# Patient Record
Sex: Male | Born: 1955 | Race: White | Hispanic: No | Marital: Married | Smoking: Never smoker
Health system: Southern US, Community
[De-identification: ages and names within clinical notes are randomized; demographics above are authoritative.]

## PROBLEM LIST (undated history)

## (undated) DIAGNOSIS — I10 Essential (primary) hypertension: Secondary | ICD-10-CM

## (undated) DIAGNOSIS — I739 Peripheral vascular disease, unspecified: Secondary | ICD-10-CM

## (undated) DIAGNOSIS — N529 Male erectile dysfunction, unspecified: Secondary | ICD-10-CM

## (undated) DIAGNOSIS — I6529 Occlusion and stenosis of unspecified carotid artery: Secondary | ICD-10-CM

## (undated) DIAGNOSIS — E785 Hyperlipidemia, unspecified: Secondary | ICD-10-CM

## (undated) DIAGNOSIS — L0390 Cellulitis, unspecified: Secondary | ICD-10-CM

## (undated) HISTORY — DX: Occlusion and stenosis of unspecified carotid artery: I65.29

## (undated) HISTORY — PX: TONSILLECTOMY: SUR1361

## (undated) HISTORY — DX: Cellulitis, unspecified: L03.90

## (undated) HISTORY — DX: Hyperlipidemia, unspecified: E78.5

## (undated) HISTORY — PX: COLONOSCOPY: SHX174

## (undated) HISTORY — DX: Essential (primary) hypertension: I10

## (undated) HISTORY — DX: Male erectile dysfunction, unspecified: N52.9

## (undated) HISTORY — PX: VASECTOMY: SHX75

---

## 2008-02-02 ENCOUNTER — Ambulatory Visit (HOSPITAL_COMMUNITY): Admission: RE | Admit: 2008-02-02 | Discharge: 2008-02-02 | Payer: Self-pay | Admitting: Family Medicine

## 2008-02-02 ENCOUNTER — Ambulatory Visit: Payer: Self-pay | Admitting: Vascular Surgery

## 2008-02-02 ENCOUNTER — Encounter (INDEPENDENT_AMBULATORY_CARE_PROVIDER_SITE_OTHER): Payer: Self-pay | Admitting: Family Medicine

## 2008-04-05 ENCOUNTER — Ambulatory Visit: Payer: Self-pay | Admitting: Vascular Surgery

## 2008-12-20 ENCOUNTER — Ambulatory Visit: Payer: Self-pay | Admitting: Vascular Surgery

## 2009-06-13 ENCOUNTER — Ambulatory Visit: Payer: Self-pay | Admitting: Vascular Surgery

## 2010-05-30 ENCOUNTER — Ambulatory Visit: Payer: Self-pay | Admitting: Vascular Surgery

## 2010-10-02 ENCOUNTER — Encounter (INDEPENDENT_AMBULATORY_CARE_PROVIDER_SITE_OTHER): Payer: BC Managed Care – PPO

## 2010-10-02 DIAGNOSIS — I739 Peripheral vascular disease, unspecified: Secondary | ICD-10-CM

## 2010-11-07 ENCOUNTER — Encounter (INDEPENDENT_AMBULATORY_CARE_PROVIDER_SITE_OTHER): Payer: BC Managed Care – PPO

## 2010-11-07 ENCOUNTER — Other Ambulatory Visit (INDEPENDENT_AMBULATORY_CARE_PROVIDER_SITE_OTHER): Payer: BC Managed Care – PPO

## 2010-11-07 ENCOUNTER — Encounter (INDEPENDENT_AMBULATORY_CARE_PROVIDER_SITE_OTHER): Payer: BC Managed Care – PPO | Admitting: Vascular Surgery

## 2010-11-07 DIAGNOSIS — I7092 Chronic total occlusion of artery of the extremities: Secondary | ICD-10-CM

## 2010-11-07 DIAGNOSIS — R0989 Other specified symptoms and signs involving the circulatory and respiratory systems: Secondary | ICD-10-CM

## 2010-11-07 DIAGNOSIS — I6529 Occlusion and stenosis of unspecified carotid artery: Secondary | ICD-10-CM

## 2010-11-08 NOTE — Assessment & Plan Note (Signed)
OFFICE VISIT  Clinton [name], Clinton [name] DOB:  10-04-1955                                       11/07/2010 [id number]#:[id number]  CHIEF COMPLAINT:  Abnormal vascular exam.  HISTORY OF PRESENT ILLNESS:  The patient is a 55 year old male who I had previously seen in May 2010 for an asymptomatic moderate carotid stenosis.  Chronic medical problems remain hypertension and elevated cholesterol.  He also has a strong family history of peripheral arterial disease.  He presents to the office today after a recent lower extremity arterial noninvasive exam which showed calcified vessels.  The patient denies any symptoms of claudication.  He has no symptoms of rest pain.  This study was performed on 10/02/2010 which showed triphasic waveforms but calcified vessels.  He had a toe pressure on the right side of 95 and on the left of 65.  He denies any symptoms of TIA or amaurosis or stroke.  His last carotid duplex was in 2009.  He has several relatives who have had Pad at an early age.  He also has family history of aneurysm.  He was quite nervous about any atherosclerotic findings on his studies today.  He denies any prior history of diabetes and states that he is tested for this yearly.  SOCIAL HISTORY:  He is married.  He is an Gaffer.  He has 2 children.  He does not smoke.  He drinks approximately 1 can of beer a day.  FAMILY HISTORY:  Remarkable for his mother who has severe PAD and his father who had coronary artery disease.  He also had an uncle with an aneurysm.  REVIEW OF SYSTEMS:  A full 12 point review of systems was performed with the patient today; all this was negative.  MEDICATIONS:  Lisinopril 10 mg once a day, Crestor 10 mg once a day, aspirin 81 mg once a day.  ALLERGIES:  He has an allergy listed to Penicillin.  PHYSICAL EXAMINATION:  VITAL SIGNS:  Blood pressure 156/73 in the left arm, heart rate is 80 and regular, respirations  20. HEENT:  Unremarkable. NECK:  Has 2+ carotid pulses without bruit. CHEST:  Clear to auscultation. CARDIAC:  Regular rate and rhythm, without murmur. ABDOMEN:  Soft, nontender, with a vaguely palpable aortic pulsation perhaps expansile.  No masses. EXTREMITIES:  He has a 3+ right femoral pulse.  He has a 2+ left femoral pulse.  He has  2+ popliteal pulses bilaterally.  He has  1+ left dorsalis pedis pulse and a  2+ right dorsalis pedis pulse. SKIN:  Has no open ulcers or rashes. NEUROLOGIC:  Shows symmetric upper extremity lower extremity motor strength which is 5/5. MUSCULOSKELETAL:  Exam shows no major obvious joint deformities.  No edema.  No cyanosis.  I reviewed his noninvasive lower extremity exam from 10/02/2010 and again this shows a digit pressure on the left of 65 which is slightly decreased but still well above the range necessary for wound healing. The digit pressure on the right side was 95.  He had triphasic to biphasic waveforms on the right;  triphasic waveforms on the left and again the digit pressures were within normal range.    The patient had an aortic ultrasound today due to findings on physical exam of possible enlargement of the aorta and also family history. However, this showed a normal aortic diameter and normal  iliac diameter.  To follow up on his previous moderate carotid stenosis, he had a carotid duplex exam today which showed a 60% -80% right internal carotid artery stenosis and less than 40% left internal carotid artery stenosis.  The stenosis on the right side had progressed some in the last 2 years.  In summary, as far as the patient's lower extremities are concerned he has palpable pulses on my exam today and has noninvasive exams which were normal other than the fact he has calcified vessels and a slightly decreased toe pressure on the left side.  He had no evidence of abdominal aortic aneurysm on ultrasound.  As far as his carotid  stenosis is concerned,  I discussed with he and his wife today signs and symptoms of TIA, amaurosis and stroke.  If he develops any of these, symptoms, we would need to consider carotid endarterectomy.  However, as long as the amount of stenosis remains less than 80% and he is asymptomatic, we will continue observation for now. In light of all the above findings we will schedule him for a repeat carotid duplex exam and a noninvasive arterial duplex exam of his lower extremities in 6 months' time.  If he develops any symptoms that are worsening prior that, he will return sooner.    Janetta Hora. Fields, MD Electronically Signed  CEF/MEDQ  D:  11/07/2010  T:  11/08/2010  Job:  [id number]  cc:   Carilyn Goodpasture, PA-C

## 2010-11-18 NOTE — Procedures (Unsigned)
DUPLEX ULTRASOUND OF ABDOMINAL AORTA  INDICATION:  Pulsatile mass of the abdomen.  HISTORY: Diabetes:  no Cardiac:  no Hypertension:  yes Smoking:  no Connective Tissue Disorder: Family History:  Grandfather had aneurysms Previous Surgery:  No  DUPLEX EXAM:         AP (cm)                   TRANSVERSE (cm) Proximal             2.15 cm                   2.21 cm Mid                  1.86 cm                   1.84 cm Distal               1.78 cm                   1.72 cm Right Iliac          1.31 cm                   1.23 cm Left Iliac           1.25 cm                   1.34 cm  PREVIOUS:  Date:  AP:  TRANSVERSE:  IMPRESSION: 1. No evidence of aneurysmal dilatations identified. 2. Normal multiphasic Doppler waveforms noted throughout the aorta and     proximal iliac arteries.  ___________________________________________ Janetta Hora Fields, MD  SH/MEDQ  D:  11/07/2010  T:  11/07/2010  Job:  [id number]

## 2010-11-18 NOTE — Procedures (Unsigned)
CAROTID DUPLEX EXAM  INDICATION:  Follow up of carotid stenosis.  HISTORY: Diabetes:  No. Cardiac:  No. Hypertension:  Yes. Smoking:  No. Previous Surgery:  No. CV History:  Asymptomatic. Amaurosis Fugax No, Paresthesias No, Hemiparesis No                                      RIGHT             LEFT Brachial systolic pressure:         112               120 Brachial Doppler waveforms:         WNL               WNL Vertebral direction of flow:        Antegrade         Antegrade DUPLEX VELOCITIES (cm/sec) CCA peak systolic                   104               119 ECA peak systolic                   143               136 ICA peak systolic                   281               128 ICA end diastolic                   81                30 PLAQUE MORPHOLOGY:                  Heterogeneous     Heterogeneous PLAQUE AMOUNT:                      Moderate to severe                  Mild PLAQUE LOCATION:                    CCA/ICA           CCA/ICA  IMPRESSION:    1. Right internal carotid artery stenosis in the 60% to 79% range.    2. Left common carotid artery disease with PSV of 178 cm/s in the mid       segment.    3. Left internal carotid artery stenosis in the 1% to 39% range.    4. Increase of the right-sided disease since previous study on       05/30/2010.        ___________________________________________ Janetta Hora. Fields, MD  SH/MEDQ  D:  11/07/2010  T:  11/07/2010  Job:  [id number]

## 2010-12-24 NOTE — Procedures (Signed)
CAROTID DUPLEX EXAM   INDICATION:  Follow up carotid artery disease.   HISTORY:  Diabetes:  no  Cardiac:  no  Hypertension:  yes  Smoking:  no  Previous Surgery:  no  CV History:  no  Amaurosis Fugax No, Paresthesias No, Hemiparesis No                                       RIGHT             LEFT  Brachial systolic pressure:         108               118  Brachial Doppler waveforms:         triphasic         triphasic  Vertebral direction of flow:        antegrade         antegrade  DUPLEX VELOCITIES (cm/sec)  CCA peak systolic                   107               135  ECA peak systolic                   85                140  ICA peak systolic                   264               86  ICA end diastolic                   65                23  PLAQUE MORPHOLOGY:                  homogeneous       mixed  PLAQUE AMOUNT:                      Moderate to severe                  moderate  PLAQUE LOCATION:                    ICA               CCA, ICA.   IMPRESSION:  1. Right internal carotid artery suggests 60% to 79% stenosis.  2. Left internal carotid artery suggests 20% to 39% stenosis.  3. Bilateral vertebrals appear antegrade.   ___________________________________________  Janetta Hora. Fields, MD   CB/MEDQ  D:  06/13/2009  T:  06/14/2009  Job:  [id number]

## 2010-12-24 NOTE — Assessment & Plan Note (Signed)
OFFICE VISIT   Clinton [name], Clinton [name]  DOB:  09/21/1955                                       12/20/2008  [id number]#:[id number]   The patient returns for followup today.  He was last seen in August of  2009.  We have been following him for asymptomatic moderate right  carotid stenosis.  He denies any symptoms of TIA, amaurosis or stroke.  His primary atherosclerotic risk factor remains mildly elevated  cholesterol and borderline hypertension.   MEDICATIONS:  Include Crestor and aspirin.   PHYSICAL EXAM:  Vital signs:  Today blood pressure is 155/70 in the left  arm, 151/70 in the right arm, pulse is 60 and regular.  HEENT:  Unremarkable.  Neck:  Has 2+ carotid pulses without bruit.  Chest:  Clear to auscultation.  Cardiac:  Exam is regular rate and rhythm  without murmur.  Upper extremities:  He has 2+ brachial and radial  pulses bilaterally.  Upper extremity/lower extremity motor strength is  symmetric at 5/5 bilaterally.   He had a repeat carotid duplex exam today which showed a 60-80% right  internal carotid artery stenosis with a peak systolic velocity of 276  cm/sec and an end diastolic velocity of 89 cm/sec.  There was less than  40% stenosis of the left internal carotid artery.   These velocities were slightly higher than his last carotid duplex exam  in June of 2009 but this may represent technician and probe variability.  I believe the best option for the patient is continued risk factor  modification and observation.  He currently still has a moderate right  carotid stenosis which is asymptomatic.  If the lesion progresses to  greater than 80% we would consider carotid endarterectomy at that time.  We would also consider this if he develops symptoms of amaurosis, TIA or  stroke and these symptoms were discussed with the patient today.  Also  risk versus benefit of carotid endarterectomy versus continued  observation were discussed with the patient  today.  He will follow up  with me in six months' time for repeat carotid duplex exam.   Janetta Hora. Fields, MD  Electronically Signed   CEF/MEDQ  D:  12/20/2008  T:  12/21/2008  Job:  [id number]   cc:   Otilio Connors. Gerri Spore, M.D.  Carilyn Goodpasture

## 2010-12-24 NOTE — Procedures (Signed)
CAROTID DUPLEX EXAM   INDICATION:  Follow up carotid artery disease.   HISTORY:  Diabetes:  No.  Cardiac:  No.  Hypertension:  Yes.  Smoking:  No.  Previous Surgery:  No.  CV History:  No.  Amaurosis Fugax , Paresthesias , Hemiparesis                                       RIGHT             LEFT  Brachial systolic pressure:         110               114  Brachial Doppler waveforms:         WNL               WNL  Vertebral direction of flow:        Antegrade         Antegrade  DUPLEX VELOCITIES (cm/sec)  CCA peak systolic                   87                116  ECA peak systolic                   101               92  ICA peak systolic                   186               80  ICA end diastolic                   49                19  PLAQUE MORPHOLOGY:                  Heterogenous      Heterogenous  PLAQUE AMOUNT:                      Moderate          Moderate  PLAQUE LOCATION:                    Distal CCA and proximal ICA         Distal CCA and proximal  ICA   IMPRESSION:  1. Right internal carotid artery 40% to 59% stenosis, with decreased      velocities compared    to previous study  2. On the left, no hemodynamically significant stenosis, 1% to 39%.  3. Bilateral intimal thickening of the common carotid arteries.  4. No significant changes from previous examination of 06/13/2009.   ___________________________________________  Janetta Hora. Fields, MD   OD/MEDQ  D:  05/30/2010  T:  05/30/2010  Job:  [id number]

## 2010-12-24 NOTE — Assessment & Plan Note (Signed)
OFFICE VISIT   Clinton [name], Clinton [name]  DOB:  [birth year]                                       04/05/2008  OZHYQ#:[id number]   The patient is a 55 year old male referred for evaluation of  asymptomatic right internal carotid artery stenosis.  The patient states  that he was seeing a foot doctor for some chronic foot pain.  He was  sent for evaluation with ABIs.  At the same time they did a carotid  duplex exam.  He denies any history of TIA, amaurosis or stroke.  He  denies history of diabetes, hypertension or coronary artery disease.  He  is a nonsmoker.  He does have a history of mildly elevated cholesterol.   FAMILY HISTORY:  Significant for his father who had a stroke at fairly  young age.  Father was a heavy smoker.   PAST SURGICAL HISTORY:  Unremarkable.   MEDICATIONS:  Include Crestor 10 mg once a day.   He is allergic to penicillin, which causes a rash.   PAST SURGICAL HISTORY:  None.   SOCIAL HISTORY:  He is married and has 2 children.  Works as an Psychologist, forensic.  Is a nonsmoker and drinks a couple beers a day.   REVIEW OF SYSTEMS:  He is 5 feet 10 inches, 185 pounds.  He is very  active and runs approximately 3 miles a day.  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  Cardiac,  pulmonary, GI, GU, vascular, neurologic, orthopedic psychiatric, ENT and  hematologic review of systems are all negative.   PHYSICAL EXAM:  Blood pressure 162/93 in the left arm, 153/94 in the  right arm, heart rate is 80 and regular.  HEENT is unremarkable.  Neck  has 2+ carotid pulses without bruit.  Chest is clear to auscultation.  Cardiac exam is regular rate rhythm without murmur.  Abdomen is soft,  nontender, nondistended.  No masses.  Extremities:  He has 2+ brachial,  radial, femoral, popliteal, dorsalis pedis and posterior tibial pulses  bilaterally.   I reviewed his carotid duplex exam dated February 02, 2008 from  [facility].  This showed a 60-80% right internal carotid artery stenosis with no left  internal carotid artery stenosis and antegrade vertebral flow  bilaterally.   I discussed with the patient today risk factor modification and risk  versus benefit for a moderate carotid stenosis.  Also counseled him and  symptoms of TIA or stroke.  Currently, I believe risk factor  modification alone should be sufficient for a moderate carotid stenosis  that is asymptomatic.  If he develops symptoms, he will come back to see  me sooner.  Otherwise will repeat his ultrasound in 6 months.   Janetta Hora. Fields, MD  Electronically Signed   CEF/MEDQ  D:  04/05/2008  T:  04/06/2008  Job:  [id number]   cc:   Otilio Connors. Gerri Spore, M.D.  Carilyn Goodpasture

## 2010-12-27 NOTE — Procedures (Signed)
CAROTID DUPLEX EXAM   INDICATION:  Followup carotid artery disease.   HISTORY:  Diabetes:  No.  Cardiac:  No.  Hypertension:  No.  Smoking:  No.  Previous Surgery:  No.  CV History:  No.  Amaurosis Fugax No, Paresthesias No, Hemiparesis No                                       RIGHT             LEFT  Brachial systolic pressure:         158               150  Brachial Doppler waveforms:         WNL               WNL  Vertebral direction of flow:        Antegrade         Antegrade  DUPLEX VELOCITIES (cm/sec)  CCA peak systolic                   96                110  ECA peak systolic                   165               121  ICA peak systolic                   276               106  ICA end diastolic                   89                38  PLAQUE MORPHOLOGY:                  Homogeneous       Calcific  PLAQUE AMOUNT:                      Moderate/severe   Mild  PLAQUE LOCATION:                    ICA/bifurcation   ICA/bifurcation   IMPRESSION:  1. Right ICA shows evidence of 60-79% stenosis, no category change      from study done June 2009 at [facility],      however, higher velocities were obtained today.  2. Left ICA shows evidence of 20-39% stenosis (top end of range).       ___________________________________________  Janetta Hora Fields, MD   AS/MEDQ  D:  12/20/2008  T:  12/20/2008  Job:  [id number]

## 2011-01-02 ENCOUNTER — Ambulatory Visit: Payer: Self-pay

## 2011-01-02 ENCOUNTER — Other Ambulatory Visit: Payer: Self-pay

## 2011-01-09 ENCOUNTER — Ambulatory Visit: Payer: Self-pay

## 2011-01-09 ENCOUNTER — Other Ambulatory Visit: Payer: Self-pay

## 2011-05-08 ENCOUNTER — Other Ambulatory Visit: Payer: BC Managed Care – PPO

## 2011-05-22 ENCOUNTER — Ambulatory Visit (INDEPENDENT_AMBULATORY_CARE_PROVIDER_SITE_OTHER): Payer: Managed Care, Other (non HMO) | Admitting: *Deleted

## 2011-05-22 ENCOUNTER — Other Ambulatory Visit (INDEPENDENT_AMBULATORY_CARE_PROVIDER_SITE_OTHER): Payer: Managed Care, Other (non HMO) | Admitting: *Deleted

## 2011-05-22 DIAGNOSIS — I739 Peripheral vascular disease, unspecified: Secondary | ICD-10-CM

## 2011-05-22 DIAGNOSIS — I6529 Occlusion and stenosis of unspecified carotid artery: Secondary | ICD-10-CM

## 2011-05-28 ENCOUNTER — Encounter: Payer: Self-pay | Admitting: Vascular Surgery

## 2011-05-28 NOTE — Procedures (Unsigned)
CAROTID DUPLEX EXAM  INDICATION:  Carotid disease.  HISTORY: Diabetes:  No. Cardiac:  No. Hypertension:  Yes. Smoking:  No. Previous Surgery:  No. CV History:  Currently asymptomatic. Amaurosis Fugax No, Paresthesias No, Hemiparesis No.                                      RIGHT             LEFT Brachial systolic pressure:         128               129 Brachial Doppler waveforms:         Normal            Normal Vertebral direction of flow:        Antegrade         Antegrade DUPLEX VELOCITIES (cm/sec) CCA peak systolic                   83                104 ECA peak systolic                   104               60 ICA peak systolic                   209               80 ICA end diastolic                   61                27 PLAQUE MORPHOLOGY:                  Mixed             Mixed PLAQUE AMOUNT:                      Moderate          Mild PLAQUE LOCATION:                    CCA, ICA, ECA, bifurcation          CCA, ICA, bifurcation  IMPRESSION: 1. Right internal carotid artery velocities suggest 60% to 79%     stenosis. 2. Left internal carotid artery velocities suggest 1% to 39% stenosis. 3. Stable in comparison to the previous examination.  ___________________________________________ Janetta Hora Fields, MD  EM/MEDQ  D:  05/22/2011  T:  05/22/2011  Job:  [id number]

## 2011-11-27 ENCOUNTER — Other Ambulatory Visit: Payer: Managed Care, Other (non HMO)

## 2011-12-30 ENCOUNTER — Telehealth: Payer: Self-pay | Admitting: Vascular Surgery

## 2011-12-31 ENCOUNTER — Ambulatory Visit (INDEPENDENT_AMBULATORY_CARE_PROVIDER_SITE_OTHER): Payer: BC Managed Care – PPO | Admitting: Vascular Surgery

## 2011-12-31 DIAGNOSIS — I739 Peripheral vascular disease, unspecified: Secondary | ICD-10-CM

## 2011-12-31 DIAGNOSIS — I6529 Occlusion and stenosis of unspecified carotid artery: Secondary | ICD-10-CM

## 2012-01-01 ENCOUNTER — Ambulatory Visit (INDEPENDENT_AMBULATORY_CARE_PROVIDER_SITE_OTHER): Payer: BC Managed Care – PPO | Admitting: Vascular Surgery

## 2012-01-01 ENCOUNTER — Encounter: Payer: Self-pay | Admitting: Vascular Surgery

## 2012-01-01 VITALS — BP 149/70 | HR 64 | Temp 98.4°F | Ht 70.0 in | Wt 180.0 lb

## 2012-01-01 DIAGNOSIS — I6529 Occlusion and stenosis of unspecified carotid artery: Secondary | ICD-10-CM

## 2012-01-01 NOTE — Progress Notes (Signed)
The patient returns today for further followup. He was last seen in March of 2012. We have been following him for moderate carotid stenosis and some calcification of his left lower extremity. He does have family history of peripheral arterial disease. His risk factors include hypertension elevated cholesterol. He states he was tested for diabetes recently and still does not have this. 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 However he has noticed recently after 2-5 hours of work in the yard he develops short pain in the left forefoot region. This resolves after rest for a few minutes. He does not really describe claudication symptoms. He is been followed by Dr. Margaretha Sheffield in the past for problems in his left foot. He had a followup noninvasive vascular exam yesterday. This showed a moderate carotid stenosis which was essentially unchanged. Right internal carotid artery stenosis of 60-80% left was 40-60% with bilateral vertebral flow antegrade. His lower extremity exams are triphasic waveforms bilaterally. The left side was noncompressible. The right ABI was 1.17. The toe pressure on the right was 28 they were unable to obtain a left first digit pressure. This was the reason for scheduling his office visit with me today.  Orifices moderate carotid stenosis is concerned, he denies any symptoms of TIA amaurosis or stroke.  Review of systems: Cardiac-no chest pain, respiratory-no shortness of breath with exertion or at rest, no wheeze, endocrine: Recent laboratory testing no evidence of diabetes, neuro: No history of peripheral neuropathy, musculoskeletal as above, psychiatric recent anxiety levels and stress view to job issues  Physical exam Filed Vitals:   01/01/12 1037 01/01/12 1048  BP: 158/65 149/70  Pulse: 64   Temp: 98.4 F (36.9 C)   TempSrc: Oral   Height: 5\' 10"  (1.778 m)   Weight: 180 lb (81.647 kg)   SpO2: 100%     Neck: No carotid bruit  chest: Clear  to auscultation bilaterally  cardiac: Regular rate and rhythm Lower extremities: 2+ femoral 1+ popliteal bilaterally 2+ right dorsalis pedis pulse one plus left dorsalis pedis pulse Skin: No open ulcer or rash capillary refill right first toe slightly faster than left first toe  Data: I reviewed and interpreted his noninvasive vascular exam is results of which are listed in history of present illness  Assessment: Left lower extremity arterial calcification. He had a decreased toe pressure on noninvasive exam the remainder of exam other than calcification was normal. He has not had diabetes and should not be really at risk for small vessel disease. However, he certainly could have an atheroembolic event. The toe however appears healthy currently. His symptoms are forefoot pain did not really correlate with the findings of this exam. I believe that his forefoot pain is more musculoskeletal in nature.  Plan: #1 moderate carotid stenosis needs six-month followup repeat carotid duplex exam #2 peripheral arterial disease primarily left lower extremity is needs six-month followup ABIs with toe pressures but return sooner if musculoskeletal issues cannot be used to explain his current symptoms and consider arteriogram  Fabienne Bruns, MD [facility] Office: [phone] Pager: [phone]

## 2012-01-09 NOTE — Procedures (Unsigned)
CAROTID DUPLEX EXAM  INDICATION:  Carotid stenosis.  HISTORY: Diabetes:  No. Cardiac:  No. Hypertension:  Yes. Smoking:  No. Previous Surgery:  No carotid intervention. CV History:  Currently asymptomatic. Amaurosis Fugax No, Paresthesias No, Hemiparesis No                                      RIGHT             LEFT Brachial systolic pressure:         112               119 Brachial Doppler waveforms:         WNL               WNL Vertebral direction of flow:        Antegrade         Antegrade DUPLEX VELOCITIES (cm/sec) CCA peak systolic                   91                119-P/157-M ECA peak systolic                   142               120 ICA peak systolic                   268               169 ICA end diastolic                   72                44 PLAQUE MORPHOLOGY:                  Heterogenous      Calcified PLAQUE AMOUNT:                      Moderate to severe                  Moderate PLAQUE LOCATION:                    CCA/ICA           CCA/ICA  IMPRESSION: 1. Bilateral common carotid artery disease present. 2. Right internal carotid artery stenosis present in the 60-79% range. 3. Bilateral external carotid arteries appear patent. 4. Left internal carotid artery stenosis present in the 40-59% range. 5. Bilateral vertebral arteries are patent and antegrade. 6. Stable on the right and increased disease on the left since     previous study on 05/22/2011.  ___________________________________________ Janetta Hora. Fields, MD  SH/MEDQ  D:  12/31/2011  T:  12/31/2011  Job:  [id number]

## 2012-01-15 ENCOUNTER — Ambulatory Visit: Payer: Managed Care, Other (non HMO) | Admitting: Vascular Surgery

## 2012-06-25 ENCOUNTER — Other Ambulatory Visit: Payer: Self-pay | Admitting: *Deleted

## 2012-06-25 DIAGNOSIS — I739 Peripheral vascular disease, unspecified: Secondary | ICD-10-CM

## 2012-06-25 DIAGNOSIS — I6529 Occlusion and stenosis of unspecified carotid artery: Secondary | ICD-10-CM

## 2012-06-30 ENCOUNTER — Encounter: Payer: Self-pay | Admitting: Neurosurgery

## 2012-07-01 ENCOUNTER — Ambulatory Visit: Payer: BC Managed Care – PPO | Admitting: Neurosurgery

## 2012-07-01 ENCOUNTER — Other Ambulatory Visit: Payer: BC Managed Care – PPO

## 2012-08-25 ENCOUNTER — Encounter: Payer: Self-pay | Admitting: Neurosurgery

## 2012-08-26 ENCOUNTER — Other Ambulatory Visit (INDEPENDENT_AMBULATORY_CARE_PROVIDER_SITE_OTHER): Payer: BC Managed Care – PPO | Admitting: *Deleted

## 2012-08-26 ENCOUNTER — Encounter (INDEPENDENT_AMBULATORY_CARE_PROVIDER_SITE_OTHER): Payer: BC Managed Care – PPO | Admitting: *Deleted

## 2012-08-26 ENCOUNTER — Ambulatory Visit (INDEPENDENT_AMBULATORY_CARE_PROVIDER_SITE_OTHER): Payer: BC Managed Care – PPO | Admitting: Neurosurgery

## 2012-08-26 ENCOUNTER — Encounter: Payer: Self-pay | Admitting: Neurosurgery

## 2012-08-26 VITALS — BP 131/79 | HR 68 | Resp 16 | Ht 69.0 in | Wt 192.0 lb

## 2012-08-26 DIAGNOSIS — I6529 Occlusion and stenosis of unspecified carotid artery: Secondary | ICD-10-CM

## 2012-08-26 DIAGNOSIS — I739 Peripheral vascular disease, unspecified: Secondary | ICD-10-CM

## 2012-08-26 NOTE — Progress Notes (Signed)
[facility] [place] Carotid Office Note  CC: Carotid and PAD surveillance Referring Physician: Fields  History of Present Illness: 57 year old male patient of [name] with a history of calcified vessels in the lower extremities which were the case today and there was not ABIs completed however the toe pressure was normal the right and abnormal left when compared to previous exam. The patient denies claudication or rest pain, the patient also denies signs or symptoms of CVA, TIA, amaurosis fugax.  Past Medical History  Diagnosis Date  . Cellulitis   . Hypertension   . Hyperlipidemia   . Carotid artery occlusion     ROS: [x]  Positive   [ ]  Denies    General: [ ]  Weight loss, [ ]  Fever, [ ]  chills Neurologic: [ ]  Dizziness, [ ]  Blackouts, [ ]  Seizure [ ]  Stroke, [ ]  "Mini stroke", [ ]  Slurred speech, [ ]  Temporary blindness; [ ]  weakness in arms or legs, [ ]  Hoarseness Cardiac: [ ]  Chest pain/pressure, [ ]  Shortness of breath at rest [ ]  Shortness of breath with exertion, [ ]  Atrial fibrillation or irregular heartbeat Vascular: [ ]  Pain in legs with walking, [ ]  Pain in legs at rest, [ ]  Pain in legs at night,  [ ]  Non-healing ulcer, [ ]  Blood clot in vein/DVT,   Pulmonary: [ ]  Home oxygen, [ ]  Productive cough, [ ]  Coughing up blood, [ ]  Asthma,  [ ]  Wheezing Musculoskeletal:  [ ]  Arthritis, [ ]  Low back pain, [ ]  Joint pain Hematologic: [ ]  Easy Bruising, [ ]  Anemia; [ ]  Hepatitis Gastrointestinal: [ ]  Blood in stool, [ ]  Gastroesophageal Reflux/heartburn, [ ]  Trouble swallowing Urinary: [ ]  chronic Kidney disease, [ ]  on HD - [ ]  MWF or [ ]  TTHS, [ ]  Burning with urination, [ ]  Difficulty urinating Skin: [ ]  Rashes, [ ]  Wounds Psychological: [ ]  Anxiety, [ ]  Depression   Social History History  Substance Use Topics  . Smoking status: Never Smoker   . Smokeless tobacco: Former Neurosurgeon    Types: Chew    Quit date: 12/31/2009  . Alcohol Use: No   Comment: pt states that he use to have a beer occasionly but has not had one in about one month    Family History Family History  Problem Relation Age of Onset  . Other Mother     circulatory problems  . Heart attack Mother   . Hyperlipidemia Father   . Hypertension Father   . Diabetes Father   . Hypertension Brother   . Hyperlipidemia Brother     Allergies  Allergen Reactions  . Penicillins Rash    Current Outpatient Prescriptions  Medication Sig Dispense Refill  . aspirin 81 MG tablet Take 81 mg by mouth daily.      [name] Kitchen atorvastatin (LIPITOR) 10 MG tablet       . lisinopril (PRINIVIL,ZESTRIL) 10 MG tablet Take 10 mg by mouth daily.      . rosuvastatin (CRESTOR) 10 MG tablet Take 10 mg by mouth daily.        Physical Examination  Filed Vitals:   08/26/12 1603  BP: 131/79  Pulse: 68  Resp:     Body mass index is 28.35 kg/(m^2).  General:  WDWN in NAD Gait: Normal HEENT: WNL Eyes: Pupils equal Pulmonary: normal non-labored breathing , without Rales, rhonchi,  wheezing Cardiac: RRR, without  Murmurs, rubs or gallops; Abdomen: soft, NT, no masses Skin: no rashes, ulcers  noted  Vascular Exam Pulses: 3+ radial pulses bilaterally, palpable femoral pulses, lower show a pulses are not palpated Carotid bruits: Low-flow pulse heard on the right with a normal pulse on the left Extremities without ischemic changes, no Gangrene , no cellulitis; no open wounds;  Musculoskeletal: no muscle wasting or atrophy   Neurologic: A&O X 3; Appropriate Affect ; SENSATION: normal; MOTOR FUNCTION:  moving all extremities equally. Speech is fluent/normal  Non-Invasive Vascular Imaging CAROTID DUPLEX 08/26/2012  Right ICA 60 - 79 % stenosis Left ICA 20 - 39 % stenosis   ASSESSMENT/PLAN: Asymptomatic patient that has been somewhat downgraded and left ICA stenosis from previous exam with consistent 60-79% on the right and noncompressible lower extremity vessels as described in the  history of present illness. The patient will followup in 6 months for repeat carotid study and ABIs. The patient's in agreement with this, his questions were encouraged and answered.  Lauree Chandler ANP    Clinic MD: Darrick Penna

## 2012-09-06 ENCOUNTER — Other Ambulatory Visit: Payer: Self-pay | Admitting: *Deleted

## 2012-09-06 DIAGNOSIS — I739 Peripheral vascular disease, unspecified: Secondary | ICD-10-CM

## 2012-09-06 DIAGNOSIS — I6529 Occlusion and stenosis of unspecified carotid artery: Secondary | ICD-10-CM

## 2013-02-24 ENCOUNTER — Other Ambulatory Visit (INDEPENDENT_AMBULATORY_CARE_PROVIDER_SITE_OTHER): Payer: 59 | Admitting: *Deleted

## 2013-02-24 ENCOUNTER — Ambulatory Visit: Payer: BC Managed Care – PPO | Admitting: Neurosurgery

## 2013-02-24 ENCOUNTER — Encounter (INDEPENDENT_AMBULATORY_CARE_PROVIDER_SITE_OTHER): Payer: 59 | Admitting: *Deleted

## 2013-02-24 DIAGNOSIS — I6529 Occlusion and stenosis of unspecified carotid artery: Secondary | ICD-10-CM

## 2013-02-24 DIAGNOSIS — I739 Peripheral vascular disease, unspecified: Secondary | ICD-10-CM

## 2013-02-25 ENCOUNTER — Other Ambulatory Visit: Payer: Self-pay | Admitting: *Deleted

## 2013-03-01 ENCOUNTER — Encounter: Payer: Self-pay | Admitting: Vascular Surgery

## 2013-08-26 ENCOUNTER — Other Ambulatory Visit (HOSPITAL_COMMUNITY): Payer: 59

## 2013-08-26 ENCOUNTER — Ambulatory Visit: Payer: 59 | Admitting: Family

## 2013-08-30 ENCOUNTER — Ambulatory Visit: Payer: 59 | Admitting: Family

## 2013-08-30 ENCOUNTER — Other Ambulatory Visit (HOSPITAL_COMMUNITY): Payer: 59

## 2013-09-12 ENCOUNTER — Other Ambulatory Visit (HOSPITAL_COMMUNITY): Payer: 59

## 2013-09-27 ENCOUNTER — Ambulatory Visit: Payer: 59 | Admitting: Vascular Surgery

## 2013-09-28 ENCOUNTER — Encounter: Payer: Self-pay | Admitting: Vascular Surgery

## 2013-09-29 ENCOUNTER — Ambulatory Visit (HOSPITAL_COMMUNITY)
Admission: RE | Admit: 2013-09-29 | Discharge: 2013-09-29 | Disposition: A | Discharge status: Home / Self Care | Payer: 59 | Source: Ambulatory Visit | Attending: Vascular Surgery | Admitting: Vascular Surgery

## 2013-09-29 ENCOUNTER — Other Ambulatory Visit: Payer: Self-pay | Admitting: Vascular Surgery

## 2013-09-29 ENCOUNTER — Ambulatory Visit (INDEPENDENT_AMBULATORY_CARE_PROVIDER_SITE_OTHER)
Admission: RE | Admit: 2013-09-29 | Discharge: 2013-09-29 | Disposition: A | Discharge status: Home / Self Care | Payer: 59 | Source: Ambulatory Visit | Attending: Vascular Surgery | Admitting: Vascular Surgery

## 2013-09-29 ENCOUNTER — Ambulatory Visit (INDEPENDENT_AMBULATORY_CARE_PROVIDER_SITE_OTHER): Payer: 59 | Admitting: Vascular Surgery

## 2013-09-29 ENCOUNTER — Encounter: Payer: Self-pay | Admitting: Vascular Surgery

## 2013-09-29 VITALS — BP 152/64 | HR 59 | Ht 69.0 in | Wt 194.0 lb

## 2013-09-29 DIAGNOSIS — R0989 Other specified symptoms and signs involving the circulatory and respiratory systems: Secondary | ICD-10-CM

## 2013-09-29 DIAGNOSIS — I6529 Occlusion and stenosis of unspecified carotid artery: Secondary | ICD-10-CM

## 2013-09-29 DIAGNOSIS — I658 Occlusion and stenosis of other precerebral arteries: Secondary | ICD-10-CM | POA: Insufficient documentation

## 2013-09-29 NOTE — Progress Notes (Signed)
Patient is a 58 year old male who returns today for followup regarding peripheral arterial disease and carotid occlusive disease. The patient denies any claudication symptoms. He denies any symptoms of TIA amaurosis or stroke. Today he also complains of symptoms of erectile dysfunction. He also complains of swelling around his left first toe. He occasionally develops swelling in both lower extremities when riding an airplane for long distances were sitting at his desk all day. Chronic medical problems include hyperlipidemia hypertension both of which are currently stable.  Past Medical History  Diagnosis Date  . Cellulitis   . Hypertension   . Hyperlipidemia   . Carotid artery occlusion    History reviewed. No pertinent past surgical history.   Review of systems: He denies shortness of breath. He denies chest pain.  Current Outpatient Prescriptions on File Prior to Visit  Medication Sig Dispense Refill  . aspirin 81 MG tablet Take 81 mg by mouth daily.      [name] Kitchen. atorvastatin (LIPITOR) 10 MG tablet       . lisinopril (PRINIVIL,ZESTRIL) 10 MG tablet Take 10 mg by mouth daily.      . rosuvastatin (CRESTOR) 10 MG tablet Take 10 mg by mouth daily.       No current facility-administered medications on file prior to visit.    Physical exam:  Filed Vitals:   09/29/13 1045 09/29/13 1048  BP: 153/74 152/64  Pulse: 59   Height: 5\' 9"  (1.753 m)   Weight: 194 lb (87.998 kg)   SpO2: 100%     Neck: No carotid bruits  Chest: Clear to auscultation bilaterally  Cardiac: Regular rate and rhythm without murmur  Abdomen: Soft nontender no mass  Extremities: 2+ femoral pulses bilaterally, 1+ left dorsalis pedis pulse 2+ right dorsalis pedis pulse, 2+ popliteal pulses bilaterally, left first toe slightly more swollen than right no erythema no ulceration nontender, trace edema lower extremities to knee level bilaterally    Data: Patient had bilateral ABIs today. Right ABI was 1.23. Left ABI was  not calculated due to calcified vessels. He had biphasic to triphasic Doppler waveforms in the left side.  Patient also had a carotid duplex scan which showed right 60-80% stenosis which was essentially unchanged and left less than 40% stenosis. I reviewed and interpreted these studies.  Assessment: Moderate right internal carotid artery stenosis asymptomatic repeat carotid duplex scan 6 months continue aspirin. Lower extremity arterial exam with calcified vessels but overall reasonable perfusion bilaterally. We'll defer further lower extremity arterial evaluation unless he develops symptoms. Left first toe swelling no obvious trauma not really bothersome to him other than the appearance we'll consider podiatry referral if symptoms persist no obvious cause of this but has not really appear to have an ominous course. This may be related to his overall lower extremity swelling.  Plan: #1 referral to urology for evaluation of erectile dysfunction #2 repeat carotid duplex exam 6 months #3 patient was given a prescription today for bilateral lower extremity compression stockings to improve the swelling in his lower extremities and potentially his left first toe.  Fabienne Brunsharles Aveline Daus, MD [facility] [place] Office: [phone] Pager: [phone]

## 2013-10-06 ENCOUNTER — Other Ambulatory Visit: Payer: Self-pay | Admitting: *Deleted

## 2013-10-06 DIAGNOSIS — I6529 Occlusion and stenosis of unspecified carotid artery: Secondary | ICD-10-CM

## 2014-02-06 ENCOUNTER — Encounter: Payer: Self-pay | Admitting: Interventional Cardiology

## 2014-02-06 ENCOUNTER — Encounter: Payer: Self-pay | Admitting: *Deleted

## 2014-02-06 DIAGNOSIS — I10 Essential (primary) hypertension: Secondary | ICD-10-CM | POA: Insufficient documentation

## 2014-02-06 DIAGNOSIS — E785 Hyperlipidemia, unspecified: Secondary | ICD-10-CM | POA: Insufficient documentation

## 2014-02-06 DIAGNOSIS — L0390 Cellulitis, unspecified: Secondary | ICD-10-CM | POA: Insufficient documentation

## 2014-03-29 ENCOUNTER — Encounter: Payer: Self-pay | Admitting: Vascular Surgery

## 2014-03-30 ENCOUNTER — Ambulatory Visit (HOSPITAL_COMMUNITY)
Admission: RE | Admit: 2014-03-30 | Discharge: 2014-03-30 | Disposition: A | Discharge status: Home / Self Care | Payer: Medicare HMO | Source: Ambulatory Visit | Attending: Vascular Surgery | Admitting: Vascular Surgery

## 2014-03-30 ENCOUNTER — Ambulatory Visit (INDEPENDENT_AMBULATORY_CARE_PROVIDER_SITE_OTHER): Payer: Medicare HMO | Admitting: Vascular Surgery

## 2014-03-30 ENCOUNTER — Encounter: Payer: Self-pay | Admitting: Vascular Surgery

## 2014-03-30 VITALS — BP 131/74 | HR 62 | Temp 98.1°F | Resp 16 | Ht 70.0 in | Wt 191.0 lb

## 2014-03-30 DIAGNOSIS — I6529 Occlusion and stenosis of unspecified carotid artery: Secondary | ICD-10-CM

## 2014-03-30 NOTE — Progress Notes (Signed)
Patient is a 58 year old male who returns today for followup regarding peripheral arterial disease and carotid occlusive disease. The patient denies any claudication symptoms. He denies any symptoms of TIA amaurosis or stroke. He also complains of swelling around his left first toe but states this is improved with compression stockings. He occasionally develops swelling in both lower extremities when riding an airplane for long distances were sitting at his desk all day. Chronic medical problems include hyperlipidemia hypertension both of which are currently stable.    Past Medical History   Diagnosis  Date   .  Cellulitis     .  Hypertension     .  Hyperlipidemia     .  Carotid artery occlusion      History reviewed. No pertinent past surgical history.   Review of systems: He denies shortness of breath. He denies chest pain.    Current Outpatient Prescriptions on File Prior to Visit   Medication  Sig  Dispense  Refill   .  aspirin 81 MG tablet  Take 81 mg by mouth daily.         [name] Kitchen.  atorvastatin (LIPITOR) 10 MG tablet           .  lisinopril (PRINIVIL,ZESTRIL) 10 MG tablet  Take 10 mg by mouth daily.         .  rosuvastatin (CRESTOR) 10 MG tablet  Take 10 mg by mouth daily.            No current facility-administered medications on file prior to visit.     Physical exam:  Filed Vitals:   03/30/14 1608 03/30/14 1611  BP: 131/76 131/74  Pulse: 62 62  Temp: 98.1 F (36.7 C)   TempSrc: Oral   Resp: 16   Height: 5\' 10"  (1.778 m)   Weight: 191 lb (86.637 kg)   SpO2: 99%    Neck: No carotid bruits  Chest: Clear to auscultation bilaterally  Cardiac: Regular rate and rhythm without murmur  Abdomen: Soft nontender no mass  Extremities: 2+ femoral pulses bilaterally, 1+ left dorsalis pedis pulse 2+ right dorsalis pedis pulse, 2+ popliteal pulses bilaterally,  no erythema no ulceration nontender, trace edema lower extremities to knee level bilaterally  Data: Patient had a carotid  duplex scan which showed right 60-80% stenosis which was essentially unchanged and left less than 40% stenosis. I reviewed and interpreted these studies.  Assessment: Moderate right internal carotid artery stenosis asymptomatic repeat carotid duplex scan 6 months continue aspirin. Lower extremity arterial exam with calcified vessels but overall reasonable perfusion bilaterally. We'll defer further lower extremity arterial evaluation unless he develops symptoms. Left first toe swelling no obvious trauma not really bothersome to him other than the appearance we'll consider podiatry referral if symptoms persist no obvious cause of this but has not really appear to have an ominous course. This may be related to his overall lower extremity swelling.  Plan:  repeat carotid duplex exam 6 months  Fabienne Brunsharles Nigeria Lasseter, MD [facility] [place] Office: [phone] Pager: [phone]

## 2014-10-04 ENCOUNTER — Encounter: Payer: Self-pay | Admitting: Vascular Surgery

## 2014-10-05 ENCOUNTER — Ambulatory Visit: Payer: Medicare HMO | Admitting: Vascular Surgery

## 2014-10-05 ENCOUNTER — Other Ambulatory Visit (HOSPITAL_COMMUNITY): Payer: Medicare HMO

## 2014-11-16 ENCOUNTER — Ambulatory Visit: Payer: Medicare HMO | Admitting: Vascular Surgery

## 2014-11-16 ENCOUNTER — Other Ambulatory Visit (HOSPITAL_COMMUNITY): Payer: Medicare HMO

## 2014-12-13 ENCOUNTER — Encounter: Payer: Self-pay | Admitting: Vascular Surgery

## 2014-12-14 ENCOUNTER — Ambulatory Visit (INDEPENDENT_AMBULATORY_CARE_PROVIDER_SITE_OTHER): Payer: Managed Care, Other (non HMO) | Admitting: Vascular Surgery

## 2014-12-14 ENCOUNTER — Encounter: Payer: Self-pay | Admitting: Vascular Surgery

## 2014-12-14 ENCOUNTER — Ambulatory Visit (HOSPITAL_COMMUNITY)
Admission: RE | Admit: 2014-12-14 | Discharge: 2014-12-14 | Disposition: A | Discharge status: Home / Self Care | Payer: Managed Care, Other (non HMO) | Source: Ambulatory Visit | Attending: Vascular Surgery | Admitting: Vascular Surgery

## 2014-12-14 VITALS — BP 114/57 | HR 72 | Resp 16 | Ht 69.0 in | Wt 193.0 lb

## 2014-12-14 DIAGNOSIS — I6523 Occlusion and stenosis of bilateral carotid arteries: Secondary | ICD-10-CM | POA: Diagnosis not present

## 2014-12-14 DIAGNOSIS — I6529 Occlusion and stenosis of unspecified carotid artery: Secondary | ICD-10-CM | POA: Diagnosis not present

## 2014-12-14 LAB — VAS US CAROTID
LCCAPDIAS: 28 cm/s
LCCAPSYS: 130 cm/s
LECASYS: 136 cm/s
LEFT BRACHIAL SYS: 120 cm/s
LEFT CCA MID DIAS: 24 R/I
LEFT CCA MID SYS: 136 cm/s
LEFT ECA DIAS: 18 cm/s
LEFT ICA/CCA RATIO: 0.74 cm/s
LEFT VERTEBRAL DIAS: 16 cm/s
LVERTSYS: 74 cm/s
Left CCA dist dias: 28 cm/s
Left CCA dist sys: 145 cm/s
Left ICA dist dias: 36 cm/s
Left ICA dist sys: 114 cm/s
Left ICA mid dias: 32 cm/s
Left ICA mid sys: 119 cm/s
Left ICA prox dias: 25 cm/s
Left ICA prox sys: 101 cm/s
Left subclavian sys: 220 cm/s
RCCADDIAS: 26 cm/s
RCCAPDIAS: 14 cm/s
RCCAPSYS: 116 cm/s
RICADSYS: 104 cm/s
RIGHT CCA MID DIAS: 22 cm/s
RIGHT CCA MID SYS: 118 cm/s
RIGHT ECA DIAS: 31 cm/s
RIGHT VERTEBRAL DIAS: 11 cm/s
RVERTSYS: 43 cm/s
Right Brachial systolic velocity: 110 cm/s
Right ICA dist dias: 27 cm/s
Right ICA mid dias: 37 cm/s
Right ICA mid sys: 139 cm/s
Right ICA prox dias: 97 cm/s
Right ICA prox sys: 354 cm/s
Right cca dist sys: 140 cm/s
Right eca sys: 164 cm/s
Right subclavian sys: 259 cm/s

## 2014-12-14 NOTE — Addendum Note (Signed)
Addended by: Sharee PimpleMCCHESNEY, MARILYN K on: 12/14/2014 04:09 PM   Modules accepted: Orders

## 2014-12-14 NOTE — Progress Notes (Signed)
    Established Carotid Patient  History of Present Illness  Clinton [name] is a 59 y.o. (09-Jan-1956) male who presents for follow-up of his carotid artery disease.   Previous carotid studies demonstrated: RICA 60-79% stenosis, LICA <40% stenosis and right subclavian stenosis.  Patient has no history of TIA or stroke symptom.  The patient has not had amaurosis fugax or monocular blindness.  The patient has not had facial drooping or hemiplegia.  The patient has not had receptive or expressive aphasia.  He denies any weakness of his right extremity.   The patient's PMH, PSH, SH, FamHx, Med, and Allergies are unchanged from 03/30/14. He is active and exercises daily. He is not a smoker.   Current Outpatient Prescriptions on File Prior to Visit  Medication Sig Dispense Refill  . aspirin 81 MG tablet Take 81 mg by mouth daily.    [name] Kitchen. atorvastatin (LIPITOR) 10 MG tablet     . lisinopril (PRINIVIL,ZESTRIL) 10 MG tablet Take 10 mg by mouth daily.    . rosuvastatin (CRESTOR) 10 MG tablet Take 10 mg by mouth daily.     No current facility-administered medications on file prior to visit.     Physical Examination  Filed Vitals:   12/14/14 1338 12/14/14 1342  BP: 117/57 114/57  Pulse: 68 72  Resp:  16  Height:  5\' 9"  (1.753 m)  Weight:  193 lb (87.544 kg)  SpO2:  99%   Body mass index is 28.49 kg/(m^2).  General: A&O x 3, WDWN male in NAD  Neck: Supple  Pulmonary: Sym exp, good air movt, CTAB, no rales, rhonchi, & wheezing  Cardiac: RRR, Nl S1, S2, no Murmurs, rubs or gallops  Vascular: No carotid bruits. 2+ radial pulses b/l  Musculoskeletal: M/S 5/5 throughout.   Neurologic: CN 2-12 grossly intact. Pain and light touch intact in extremities.   Non-Invasive Vascular Imaging  CAROTID DUPLEX (Date: 12/14/14):   R ICA stenosis: 50-69%  R VA:  patent and antegrade  L ICA stenosis: 1-49%  L VA:  patent and antegrade  Right subclavian artery PSV 259 cm/s, left subclavian  PSV 220 cm/s.   Medical Decision Making  Clinton [name] is a 59 y.o. male who presents with: asymptomatic right internal carotid stenosis 50-69% and asymptomatic right subclavian stenosis. The patient's duplex studies are essentially unchanged since his prior study six months ago. He has remained asymptomatic with no history of TIA or stroke symptoms. He is on maximal medical management with an aspirin and statin. He is not a smoker and exercises regularly. He will follow-up in six months with repeat carotid duplex studies.   Clinton BergerKimberly Seleta Hovland, PA-C [facility] [place] Office: [phone] Pager: [phone]  12/14/2014, 2:12 PM  This patient was seen in conjunction with Dr. Darrick PennaFields.    History and exam details as above. Patient has an asymptomatic subclavian stenosis and has an easily palpable radial pulse. His carotid stenosis is stable. He denies any symptoms of TIA amaurosis or stroke. He will continue his aspirin and risk factor modification. He will follow-up in 6 months. He will have a repeat carotid duplex at that time.  Fabienne Brunsharles Fields, MD [facility] [place] Office: [phone] Pager: [phone]

## 2015-06-21 ENCOUNTER — Ambulatory Visit: Payer: Managed Care, Other (non HMO) | Admitting: Vascular Surgery

## 2015-06-21 ENCOUNTER — Encounter (HOSPITAL_COMMUNITY): Payer: Managed Care, Other (non HMO)

## 2015-06-28 ENCOUNTER — Ambulatory Visit: Payer: Managed Care, Other (non HMO) | Admitting: Vascular Surgery

## 2015-07-19 ENCOUNTER — Encounter: Payer: Self-pay | Admitting: Vascular Surgery

## 2015-07-19 ENCOUNTER — Ambulatory Visit (HOSPITAL_COMMUNITY)
Admission: RE | Admit: 2015-07-19 | Discharge: 2015-07-19 | Disposition: A | Discharge status: Home / Self Care | Payer: 59 | Source: Ambulatory Visit | Attending: Vascular Surgery | Admitting: Vascular Surgery

## 2015-07-19 ENCOUNTER — Ambulatory Visit (INDEPENDENT_AMBULATORY_CARE_PROVIDER_SITE_OTHER): Payer: 59 | Admitting: Vascular Surgery

## 2015-07-19 VITALS — BP 156/82 | HR 60 | Temp 98.3°F | Resp 16 | Ht 69.0 in | Wt 201.0 lb

## 2015-07-19 DIAGNOSIS — I6523 Occlusion and stenosis of bilateral carotid arteries: Secondary | ICD-10-CM | POA: Insufficient documentation

## 2015-07-19 DIAGNOSIS — I6521 Occlusion and stenosis of right carotid artery: Secondary | ICD-10-CM | POA: Diagnosis not present

## 2015-07-19 NOTE — Progress Notes (Signed)
Filed Vitals:   07/19/15 1558 07/19/15 1601 07/19/15 1604 07/19/15 1605  BP: 166/82 164/81 171/80 156/82  Pulse: 56 62 60 60  Temp:  98.3 F (36.8 C)    TempSrc:  Oral    Resp:  16    Height:  5\' 9"  (1.753 m)    Weight:  201 lb (91.173 kg)    SpO2:  99%

## 2015-07-19 NOTE — Progress Notes (Signed)
    Established Carotid Patient  History of Present Illness  Clinton [name] is a 59 y.o. ([birth year]) male who presents for follow-up of his carotid artery disease. We have been following a moderate right carotid stenosis since 2014. It has remained asymptomatic.   Patient has no history of TIA or stroke symptom.  The patient has not had amaurosis fugax or monocular blindness.  He has had a 10 mm gradient in his right arm compared to his left in the past. This has always been asymptomatic.  He is active and exercises daily. He is not a smoker. Chronic medical problems continue to remain hyperlipidemia hypertension but which are currently controlled.     Past Medical History  Diagnosis Date  . Cellulitis   . Hypertension   . Hyperlipidemia   . Carotid artery occlusion   . Erectile dysfunction    Past Surgical History  Procedure Laterality Date  . Tonsillectomy    . Vasectomy     Current Outpatient Prescriptions on File Prior to Visit  Medication Sig Dispense Refill  . aspirin 81 MG tablet Take 81 mg by mouth daily.    [name] Kitchen. atorvastatin (LIPITOR) 10 MG tablet     . lisinopril (PRINIVIL,ZESTRIL) 10 MG tablet Take 10 mg by mouth daily.    . rosuvastatin (CRESTOR) 10 MG tablet Take 10 mg by mouth daily.    [name] Kitchen. VIAGRA 100 MG tablet   5   No current facility-administered medications on file prior to visit.    Allergies  Allergen Reactions  . Penicillins Rash    Physical Examination    Filed Vitals:   07/19/15 1558 07/19/15 1601 07/19/15 1604 07/19/15 1605  BP: 166/82 164/81 171/80 156/82  Pulse: 56 62 60 60  Temp:  98.3 F (36.8 C)    TempSrc:  Oral    Resp:  16    Height:  5\' 9"  (1.753 m)    Weight:  201 lb (91.173 kg)    SpO2:  99%      Neck: No carotid bruits Chest: Clear to auscultation bilaterally Cardiac: RRR Vascular:2+ radial pulses b/l Musculoskeletal: M/S 5/5 throughout.  Neurologic: Upper 5 motor strength upper and lower extremities symmetric no facial  asymmetry  Data: Patient had repeat carotid duplex exam today. Velocities on the right side at 333/94. He is been slowly progressively increasing every 6 months for the last 2 years. Calcific plaque probably obscures higher velocities. Contralateral side has no significant stenosis, vertebral flow antegrade bilaterally  Assessment: Asymptomatic high-grade right internal carotid artery stenosis progressive over the last 2 years Plan: She will continue his aspirin. We will have him evaluated for cardiac risk stratification. Plan will be for right carotid endarterectomy after cardiology evaluation. Risks benefits possible complications and procedure details of right carotid endarterectomy were explained to the patient today. These include but are not limited to bleeding infection stroke risk 1-2% cranial nerve injury risk 5-10% myocardial risk 5%. He understands and agrees to proceed.  Fabienne Brunsharles Raenell Mensing, MD [facility] [place] Office: [phone] Pager: [phone]

## 2015-07-20 NOTE — Addendum Note (Signed)
Addended by: Adria DillELDRIDGE-LEWIS, Deanette Tullius L on: 07/20/2015 03:13 PM   Modules accepted: Orders

## 2015-07-23 ENCOUNTER — Other Ambulatory Visit: Payer: Self-pay

## 2015-08-07 ENCOUNTER — Ambulatory Visit (INDEPENDENT_AMBULATORY_CARE_PROVIDER_SITE_OTHER): Payer: 59 | Admitting: Cardiovascular Disease

## 2015-08-07 ENCOUNTER — Encounter: Payer: Self-pay | Admitting: Cardiovascular Disease

## 2015-08-07 VITALS — BP 154/78 | HR 64 | Ht 69.0 in | Wt 199.0 lb

## 2015-08-07 DIAGNOSIS — Z01818 Encounter for other preprocedural examination: Secondary | ICD-10-CM | POA: Diagnosis not present

## 2015-08-07 DIAGNOSIS — I10 Essential (primary) hypertension: Secondary | ICD-10-CM

## 2015-08-07 NOTE — Patient Instructions (Signed)
Medication Instructions:  Your physician recommends that you continue on your current medications as directed. Please refer to the Current Medication list given to you today.  Labwork: none  Testing/Procedures: Your physician has requested that you have en exercise stress myoview. For further information please visit [email or web address]. Please follow instruction sheet, as given.    Follow-Up: Follow up with Dr. Berry as needed.   Any Other Special Instructions Will Be Listed Below (If Applicable).     If you need a refill on your cardiac medications before your next appointment, please call your pharmacy.   

## 2015-08-07 NOTE — Assessment & Plan Note (Signed)
History of hypertension blood pressure measured 154/78. He is on lisinopril. Continue current meds at current dosing

## 2015-08-07 NOTE — Progress Notes (Signed)
08/07/2015 Clinton [name]   [birth year]  [id number]  Primary Physician Clinton [name] Primary Cardiologist: Clinton GessJonathan J. Berry MD Clinton [name]   HPI:  Mr. Clinton [name] is a delightful 59 year old mildly overweight married Caucasian male father of 2 children, grandfather to 2 grandchildren referred by Dr. Darrick [name] for cardiovascular clearance before elective right carotid endarterectomy scheduled for 09/05/15. His cardiovascular risk factors are notable for treated hypertension, hyperlipidemia and family history with a father who died of a myocardial infarction at age 60 and a mother at age 60. He has never had a heart attack or stroke. He denies chest pain or shortness of breath. 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   Current Outpatient Prescriptions  Medication Sig Dispense Refill  . aspirin 81 MG tablet Take 81 mg by mouth daily.    [name] Kitchen. atorvastatin (LIPITOR) 10 MG tablet Take 20 mg by mouth daily at 6 PM.     . lisinopril (PRINIVIL,ZESTRIL) 10 MG tablet Take 10 mg by mouth daily.     No current facility-administered medications for this visit.    Allergies  Allergen Reactions  . Penicillins Rash    Social History   Social History  . Marital Status: Married    Spouse Name: N/A  . Number of Children: N/A  . Years of Education: N/A   Occupational History  . Not on file.   Social History Main Topics  . Smoking status: Former Smoker    Quit date: 09/29/2012  . Smokeless tobacco: Former NeurosurgeonUser    Types: Chew    Quit date: 12/31/2009  . Alcohol Use: 2.4 - 3.6 oz/week    4-6 Cans of beer per week     Comment: pt states that he use to have a beer occasionly but has not had one in about one month  . Drug Use: No  . Sexual Activity: Not on file   Other Topics Concern  . Not on file   Social History Narrative     Review of Systems: General: negative for chills, fever, night sweats or weight changes.  Cardiovascular: negative for chest pain,  dyspnea on exertion, edema, orthopnea, palpitations, paroxysmal nocturnal dyspnea or shortness of breath Dermatological: negative for rash Respiratory: negative for cough or wheezing Urologic: negative for hematuria Abdominal: negative for nausea, vomiting, diarrhea, bright red blood per rectum, melena, or hematemesis Neurologic: negative for visual changes, syncope, or dizziness All other systems reviewed and are otherwise negative except as noted above.    Blood pressure 154/78, pulse 64, height 5\' 9"  (1.753 m), weight 199 lb (90.266 kg).  General appearance: alert and no distress Neck: no adenopathy, no carotid bruit, no JVD, supple, symmetrical, trachea midline and thyroid not enlarged, symmetric, no tenderness/mass/nodules Lungs: clear to auscultation bilaterally Heart: regular rate and rhythm, S1, S2 normal, no murmur, click, rub or gallop Extremities: extremities normal, atraumatic, no cyanosis or edema  EKG normal sinus rhythm at 64 per palpable branch block and left anterior fascicular block. I personally reviewed this EKG  ASSESSMENT AND PLAN:   Occlusion and stenosis of carotid artery without mention of cerebral infarction History of a symptomatically right internal carotid artery stenosis followed noninvasively by ultrasound at annual basis by Dr. Darrick [name]. Apparently degree of stenosis currently warrants surgical revascularization which is scheduled for 09/05/2015. The patient was referred here for cardiac assessment clearance. Give him his risk factors I'm going to get an exercise Myoview stress test to risk stratify him.  Hypertension History of hypertension  blood pressure measured 154/78. He is on lisinopril. Continue current meds at current dosing  Hyperlipidemia History of hyperlipidemia on atorvastatin followed by his PCP      Clinton Gess MD [facility], [facility] 08/07/2015 10:47 AM

## 2015-08-07 NOTE — Assessment & Plan Note (Signed)
History of a symptomatically right internal carotid artery stenosis followed noninvasively by ultrasound at annual basis by Dr. Darrick PennaFields. Apparently degree of stenosis currently warrants surgical revascularization which is scheduled for 09/05/2015. The patient was referred here for cardiac assessment clearance. Give him his risk factors I'm going to get an exercise Myoview stress test to risk stratify him.

## 2015-08-07 NOTE — Assessment & Plan Note (Signed)
History of hyperlipidemia on atorvastatin followed by his PCP 

## 2015-08-10 ENCOUNTER — Telehealth (HOSPITAL_COMMUNITY): Payer: Self-pay

## 2015-08-10 NOTE — Telephone Encounter (Signed)
Encounter complete. 

## 2015-08-15 ENCOUNTER — Ambulatory Visit (HOSPITAL_COMMUNITY)
Admission: RE | Admit: 2015-08-15 | Discharge: 2015-08-15 | Disposition: A | Discharge status: Home / Self Care | Payer: 59 | Source: Ambulatory Visit | Attending: Cardiovascular Disease | Admitting: Cardiovascular Disease

## 2015-08-15 DIAGNOSIS — I10 Essential (primary) hypertension: Secondary | ICD-10-CM | POA: Diagnosis not present

## 2015-08-15 DIAGNOSIS — Z01818 Encounter for other preprocedural examination: Secondary | ICD-10-CM | POA: Diagnosis not present

## 2015-08-15 DIAGNOSIS — I779 Disorder of arteries and arterioles, unspecified: Secondary | ICD-10-CM | POA: Diagnosis not present

## 2015-08-15 DIAGNOSIS — Z87891 Personal history of nicotine dependence: Secondary | ICD-10-CM | POA: Insufficient documentation

## 2015-08-15 DIAGNOSIS — Z8249 Family history of ischemic heart disease and other diseases of the circulatory system: Secondary | ICD-10-CM | POA: Diagnosis not present

## 2015-08-15 LAB — MYOCARDIAL PERFUSION IMAGING
CHL CUP RESTING HR STRESS: 60 {beats}/min
CHL RATE OF PERCEIVED EXERTION: 17
CSEPED: 10 min
CSEPEDS: 31 s
CSEPEW: 12.6 METS
CSEPPHR: 169 {beats}/min
LV dias vol: 118 mL
LVSYSVOL: 47 mL
MPHR: 160 {beats}/min
NUC STRESS TID: 0.84
Percent HR: 105 %
SDS: 0
SRS: 0
SSS: 0

## 2015-08-15 MED ORDER — TECHNETIUM TC 99M SESTAMIBI GENERIC - CARDIOLITE
32.2000 | Freq: Once | INTRAVENOUS | Status: AC | PRN
  Administered 2015-08-15: 31.5 via INTRAVENOUS

## 2015-08-15 MED ORDER — TECHNETIUM TC 99M SESTAMIBI GENERIC - CARDIOLITE
10.9000 | Freq: Once | INTRAVENOUS | Status: AC | PRN
  Administered 2015-08-15: 10.8 via INTRAVENOUS

## 2015-08-27 ENCOUNTER — Encounter (HOSPITAL_COMMUNITY)
Admission: RE | Admit: 2015-08-27 | Discharge: 2015-08-27 | Disposition: A | Discharge status: Home / Self Care | Payer: 59 | Source: Ambulatory Visit | Attending: Vascular Surgery | Admitting: Vascular Surgery

## 2015-08-27 ENCOUNTER — Encounter (HOSPITAL_COMMUNITY): Payer: Self-pay

## 2015-08-27 DIAGNOSIS — Z87891 Personal history of nicotine dependence: Secondary | ICD-10-CM | POA: Diagnosis not present

## 2015-08-27 DIAGNOSIS — Z79899 Other long term (current) drug therapy: Secondary | ICD-10-CM | POA: Diagnosis not present

## 2015-08-27 DIAGNOSIS — I6521 Occlusion and stenosis of right carotid artery: Secondary | ICD-10-CM | POA: Diagnosis not present

## 2015-08-27 DIAGNOSIS — Z0183 Encounter for blood typing: Secondary | ICD-10-CM | POA: Insufficient documentation

## 2015-08-27 DIAGNOSIS — Z01818 Encounter for other preprocedural examination: Secondary | ICD-10-CM | POA: Diagnosis not present

## 2015-08-27 DIAGNOSIS — Z01812 Encounter for preprocedural laboratory examination: Secondary | ICD-10-CM | POA: Diagnosis not present

## 2015-08-27 DIAGNOSIS — I10 Essential (primary) hypertension: Secondary | ICD-10-CM | POA: Insufficient documentation

## 2015-08-27 DIAGNOSIS — E785 Hyperlipidemia, unspecified: Secondary | ICD-10-CM | POA: Diagnosis not present

## 2015-08-27 DIAGNOSIS — Z7982 Long term (current) use of aspirin: Secondary | ICD-10-CM | POA: Insufficient documentation

## 2015-08-27 LAB — COMPREHENSIVE METABOLIC PANEL
ALK PHOS: 51 U/L (ref 38–126)
ALT: 24 U/L (ref 17–63)
AST: 22 U/L (ref 15–41)
Albumin: 3.9 g/dL (ref 3.5–5.0)
Anion gap: 7 (ref 5–15)
BILIRUBIN TOTAL: 0.6 mg/dL (ref 0.3–1.2)
BUN: 11 mg/dL (ref 6–20)
CALCIUM: 9.3 mg/dL (ref 8.9–10.3)
CHLORIDE: 105 mmol/L (ref 101–111)
CO2: 29 mmol/L (ref 22–32)
CREATININE: 1.13 mg/dL (ref 0.61–1.24)
GFR calc Af Amer: >60 mL/min (ref >60)
Glucose, Bld: 98 mg/dL (ref 65–99)
Potassium: 4.3 mmol/L (ref 3.5–5.1)
Sodium: 141 mmol/L (ref 135–145)
Total Protein: 6 g/dL — ABNORMAL LOW (ref 6.5–8.1)

## 2015-08-27 LAB — URINALYSIS, ROUTINE W REFLEX MICROSCOPIC
Bilirubin Urine: NEGATIVE
Glucose, UA: NEGATIVE mg/dL
Hgb urine dipstick: NEGATIVE
KETONES UR: NEGATIVE mg/dL
LEUKOCYTES UA: NEGATIVE
NITRITE: NEGATIVE
PH: 6.5 (ref 5.0–8.0)
PROTEIN: NEGATIVE mg/dL
Specific Gravity, Urine: 1.006 (ref 1.005–1.030)

## 2015-08-27 LAB — APTT: aPTT: 28 seconds (ref 24–37)

## 2015-08-27 LAB — CBC
HEMATOCRIT: 42.9 % (ref 39.0–52.0)
HEMOGLOBIN: 14.5 g/dL (ref 13.0–17.0)
MCH: 32.7 pg (ref 26.0–34.0)
MCHC: 33.8 g/dL (ref 30.0–36.0)
MCV: 96.6 fL (ref 78.0–100.0)
PLATELETS: 195 10*3/uL (ref 150–400)
RBC: 4.44 MIL/uL (ref 4.22–5.81)
RDW: 12.2 % (ref 11.5–15.5)
WBC: 4.4 10*3/uL (ref 4.0–10.5)

## 2015-08-27 LAB — TYPE AND SCREEN
ABO/RH(D): A NEG
Antibody Screen: NEGATIVE

## 2015-08-27 LAB — PROTIME-INR
INR: 1 (ref 0.00–1.49)
PROTHROMBIN TIME: 13.4 s (ref 11.6–15.2)

## 2015-08-27 LAB — SURGICAL PCR SCREEN
MRSA, PCR: NEGATIVE
STAPHYLOCOCCUS AUREUS: NEGATIVE

## 2015-08-27 LAB — ABO/RH: ABO/RH(D): A NEG

## 2015-08-27 NOTE — Pre-Procedure Instructions (Signed)
Clinton [name]  08/27/2015      [id number] - [place], [place] - [address] AT CORNER OF [facility] [address] [place] [address] Phone: [phone] Fax: [phone]    Your procedure is scheduled on Jan 25  Report to [facility] Admitting at [phone].  Call this number if you have problems the morning of surgery:  [phone]   Remember:  Do not eat food or drink liquids after midnight.  Take these medicines the morning of surgery with A SIP OF WATER na  Stop taking BC's, Goody's, Herbal medications, vitamins, Fish Oil   Do not wear jewelry, make-up or nail polish.  Do not wear lotions, powders, or perfumes.  You may wear deodorant.  Do not shave 48 hours prior to surgery.  Men may shave face and neck.  Do not bring valuables to the hospital.  [facility] is not responsible for any belongings or valuables.  Contacts, dentures or bridgework may not be worn into surgery.  Leave your suitcase in the car.  After surgery it may be brought to your room.  For patients admitted to the hospital, discharge time will be determined by your treatment team.  Patients discharged the day of surgery will not be allowed to drive home.   Special instructions: [place] - Preparing for Surgery  Before surgery, you can play an important role.  Because skin is not sterile, your skin needs to be as free of germs as possible.  You can reduce the number of germs on you skin by washing with CHG (chlorahexidine gluconate) soap before surgery.  CHG is an antiseptic cleaner which kills germs and bonds with the skin to continue killing germs even after washing.  Please DO NOT use if you have an allergy to CHG or antibacterial soaps.  If your skin becomes reddened/irritated stop using the CHG and inform your nurse when you arrive at Short Stay.  Do not shave (including legs and underarms) for at least 48 hours prior to the first CHG shower.  You may  shave your face.  Please follow these instructions carefully:   1.  Shower with CHG Soap the night before surgery and the   morning of Surgery.  2.  If you choose to wash your hair, wash your hair first as usual with your       normal shampoo.  3.  After you shampoo, rinse your hair and body thoroughly to remove the                      Shampoo.  4.  Use CHG as you would any other liquid soap.  You can apply chg directly       to the skin and wash gently with scrungie or a clean washcloth.  5.  Apply the CHG Soap to your body ONLY FROM THE NECK DOWN.        Do not use on open wounds or open sores.  Avoid contact with your eyes,       ears, mouth and genitals (private parts).  Wash genitals (private parts)       with your normal soap.  6.  Wash thoroughly, paying special attention to the area where your surgery        will be performed.  7.  Thoroughly rinse your body with warm water from the neck down.  8.  DO NOT shower/wash with your normal soap after using  and rinsing off       the CHG Soap.  9.  Pat yourself dry with a clean towel.            10.  Wear clean pajamas.            11.  Place clean sheets on your bed the night of your first shower and do not        sleep with pets.  Day of Surgery  Do not apply any lotions/deoderants the morning of surgery.  Please wear clean clothes to the hospital/surgery center.    Please read over the following fact sheets that you were given. Pain Booklet, Coughing and Deep Breathing, Blood Transfusion Information, MRSA Information and Surgical Site Infection Prevention

## 2015-08-27 NOTE — Progress Notes (Signed)
PCP is [facility] Dr Allyson SabalBerry   Stress test noted 08-15-2015 EKG not confirmed  Instructed pt and his wife to call to see if he needs to take ASA the DOS. Denies ever having a card cath or Echo.

## 2015-08-28 NOTE — Progress Notes (Signed)
Anesthesia Chart Review:  Pt is 60 year old male scheduled for R CEA on 09/05/2015 with [name].   PMH includes:  HTN, hyperlipidemia, carotid artery occlusion. Former smoker. BMI 30.   Medications: ASA, lipitor, lisinopril.   Preoperative labs reviewed.    EKG 08/15/15: sinus rhythm. RBBB. LAFB.   Nuclear stress test 08/15/15:   The left ventricular ejection fraction is normal (55-65%).  Nuclear stress EF: 60%.  There was no ST segment deviation noted during stress.  The study is normal.  This is a low risk study. 1. Low risk study 2. Nl perfusion and EF 3. Neg adequate GXT  Carotid duplex US 07/19/15:  - R ICA stenosis 60-79%, upper end of range.  - L ICA stenosis <40%  If no changes, I anticipate pt can proceed with surgery as scheduled.   [name], FNP-BC [facility] Stay [facility] Center/Anesthesiology Phone: [phone] 08/28/2015 3:01 PM

## 2015-09-04 MED ORDER — CHLORHEXIDINE GLUCONATE CLOTH 2 % EX PADS: 6.0000 | MEDICATED_PAD | Freq: Once | CUTANEOUS | Status: DC

## 2015-09-04 MED ORDER — SODIUM CHLORIDE 0.9 % IV SOLN: INTRAVENOUS | Status: DC

## 2015-09-04 MED ORDER — VANCOMYCIN HCL IN DEXTROSE 1-5 GM/200ML-% IV SOLN
1000.0000 mg | INTRAVENOUS | Status: AC
  Administered 2015-09-05: 1000 mg via INTRAVENOUS
  Filled 2015-09-04: qty 200

## 2015-09-05 ENCOUNTER — Encounter (HOSPITAL_COMMUNITY)
Admission: RE | Disposition: A | Discharge status: Home / Self Care | Payer: Self-pay | Source: Ambulatory Visit | Attending: Vascular Surgery

## 2015-09-05 ENCOUNTER — Inpatient Hospital Stay (HOSPITAL_COMMUNITY): Payer: 59 | Admitting: Certified Registered Nurse Anesthetist

## 2015-09-05 ENCOUNTER — Encounter (HOSPITAL_COMMUNITY): Payer: Self-pay | Admitting: *Deleted

## 2015-09-05 ENCOUNTER — Inpatient Hospital Stay (HOSPITAL_COMMUNITY)
Admission: RE | Admit: 2015-09-05 | Discharge: 2015-09-06 | DRG: 039 | Disposition: A | Discharge status: Home / Self Care | Payer: 59 | Source: Ambulatory Visit | Attending: Vascular Surgery | Admitting: Vascular Surgery

## 2015-09-05 ENCOUNTER — Inpatient Hospital Stay (HOSPITAL_COMMUNITY): Payer: 59 | Admitting: Emergency Medicine

## 2015-09-05 DIAGNOSIS — E785 Hyperlipidemia, unspecified: Secondary | ICD-10-CM | POA: Diagnosis present

## 2015-09-05 DIAGNOSIS — Z880 Allergy status to penicillin: Secondary | ICD-10-CM

## 2015-09-05 DIAGNOSIS — I6521 Occlusion and stenosis of right carotid artery: Secondary | ICD-10-CM | POA: Diagnosis not present

## 2015-09-05 DIAGNOSIS — I6529 Occlusion and stenosis of unspecified carotid artery: Secondary | ICD-10-CM | POA: Diagnosis present

## 2015-09-05 DIAGNOSIS — Z79899 Other long term (current) drug therapy: Secondary | ICD-10-CM | POA: Diagnosis not present

## 2015-09-05 DIAGNOSIS — I10 Essential (primary) hypertension: Secondary | ICD-10-CM | POA: Diagnosis present

## 2015-09-05 DIAGNOSIS — Z7982 Long term (current) use of aspirin: Secondary | ICD-10-CM | POA: Diagnosis not present

## 2015-09-05 HISTORY — PX: PATCH ANGIOPLASTY: SHX6230

## 2015-09-05 HISTORY — PX: ENDARTERECTOMY: SHX5162

## 2015-09-05 SURGERY — ENDARTERECTOMY, CAROTID
Anesthesia: General | Site: Neck | Laterality: Right

## 2015-09-05 MED ORDER — EPHEDRINE SULFATE 50 MG/ML IJ SOLN
INTRAMUSCULAR | Status: DC | PRN
  Administered 2015-09-05: 5 mg via INTRAVENOUS
  Administered 2015-09-05: 10 mg via INTRAVENOUS

## 2015-09-05 MED ORDER — EPHEDRINE SULFATE 50 MG/ML IJ SOLN
INTRAMUSCULAR | Status: AC
  Filled 2015-09-05: qty 1

## 2015-09-05 MED ORDER — GLYCOPYRROLATE 0.2 MG/ML IJ SOLN
INTRAMUSCULAR | Status: DC | PRN
  Administered 2015-09-05 (×2): 0.1 mg via INTRAVENOUS

## 2015-09-05 MED ORDER — HYDROMORPHONE HCL 1 MG/ML IJ SOLN
INTRAMUSCULAR | Status: AC
  Filled 2015-09-05: qty 1

## 2015-09-05 MED ORDER — LIDOCAINE HCL (PF) 1 % IJ SOLN
INTRAMUSCULAR | Status: AC
  Filled 2015-09-05: qty 30

## 2015-09-05 MED ORDER — FENTANYL CITRATE (PF) 100 MCG/2ML IJ SOLN
INTRAMUSCULAR | Status: DC | PRN
  Administered 2015-09-05: 50 ug via INTRAVENOUS
  Administered 2015-09-05: 100 ug via INTRAVENOUS

## 2015-09-05 MED ORDER — SUCCINYLCHOLINE CHLORIDE 20 MG/ML IJ SOLN
INTRAMUSCULAR | Status: AC
  Filled 2015-09-05: qty 1

## 2015-09-05 MED ORDER — SODIUM CHLORIDE 0.9 % IV SOLN: 500.0000 mL | Freq: Once | INTRAVENOUS | Status: DC | PRN

## 2015-09-05 MED ORDER — PHENOL 1.4 % MT LIQD: 1.0000 | OROMUCOSAL | Status: DC | PRN

## 2015-09-05 MED ORDER — LIDOCAINE HCL (CARDIAC) 20 MG/ML IV SOLN
INTRAVENOUS | Status: AC
  Filled 2015-09-05: qty 5

## 2015-09-05 MED ORDER — ASPIRIN EC 81 MG PO TBEC
81.0000 mg | DELAYED_RELEASE_TABLET | Freq: Every day | ORAL | Status: DC
  Filled 2015-09-05: qty 1

## 2015-09-05 MED ORDER — ROCURONIUM BROMIDE 100 MG/10ML IV SOLN
INTRAVENOUS | Status: DC | PRN
  Administered 2015-09-05: 10 mg via INTRAVENOUS
  Administered 2015-09-05: 50 mg via INTRAVENOUS
  Administered 2015-09-05: 10 mg via INTRAVENOUS
  Administered 2015-09-05: 15 mg via INTRAVENOUS

## 2015-09-05 MED ORDER — PROTAMINE SULFATE 10 MG/ML IV SOLN
INTRAVENOUS | Status: DC | PRN
  Administered 2015-09-05: 90 mg via INTRAVENOUS

## 2015-09-05 MED ORDER — DOCUSATE SODIUM 100 MG PO CAPS
100.0000 mg | ORAL_CAPSULE | Freq: Every day | ORAL | Status: DC
  Administered 2015-09-06: 100 mg via ORAL
  Filled 2015-09-05: qty 1

## 2015-09-05 MED ORDER — LABETALOL HCL 5 MG/ML IV SOLN
INTRAVENOUS | Status: DC | PRN
  Administered 2015-09-05 (×3): 5 mg via INTRAVENOUS

## 2015-09-05 MED ORDER — FENTANYL CITRATE (PF) 250 MCG/5ML IJ SOLN
INTRAMUSCULAR | Status: AC
  Filled 2015-09-05: qty 5

## 2015-09-05 MED ORDER — PROPOFOL 10 MG/ML IV BOLUS
INTRAVENOUS | Status: AC
  Filled 2015-09-05: qty 20

## 2015-09-05 MED ORDER — OXYCODONE HCL 5 MG PO TABS: 5.0000 mg | ORAL_TABLET | Freq: Four times a day (QID) | ORAL | Status: DC | PRN

## 2015-09-05 MED ORDER — ONDANSETRON HCL 4 MG/2ML IJ SOLN: 4.0000 mg | Freq: Four times a day (QID) | INTRAMUSCULAR | Status: DC | PRN

## 2015-09-05 MED ORDER — LABETALOL HCL 5 MG/ML IV SOLN: 10.0000 mg | INTRAVENOUS | Status: DC | PRN

## 2015-09-05 MED ORDER — LACTATED RINGERS IV SOLN
INTRAVENOUS | Status: DC | PRN
  Administered 2015-09-05 (×2): via INTRAVENOUS

## 2015-09-05 MED ORDER — SODIUM CHLORIDE 0.9 % IV SOLN
INTRAVENOUS | Status: DC | PRN
  Administered 2015-09-05: 09:00:00

## 2015-09-05 MED ORDER — DEXAMETHASONE SODIUM PHOSPHATE 10 MG/ML IJ SOLN
INTRAMUSCULAR | Status: DC | PRN
  Administered 2015-09-05: 10 mg via INTRAVENOUS

## 2015-09-05 MED ORDER — SUGAMMADEX SODIUM 200 MG/2ML IV SOLN
INTRAVENOUS | Status: DC | PRN
  Administered 2015-09-05: 200 mg via INTRAVENOUS

## 2015-09-05 MED ORDER — METOPROLOL TARTRATE 1 MG/ML IV SOLN: 2.0000 mg | INTRAVENOUS | Status: DC | PRN

## 2015-09-05 MED ORDER — PHENYLEPHRINE HCL 10 MG/ML IJ SOLN
10.0000 mg | INTRAVENOUS | Status: DC | PRN
  Administered 2015-09-05: 15 ug/min via INTRAVENOUS

## 2015-09-05 MED ORDER — SUGAMMADEX SODIUM 200 MG/2ML IV SOLN
INTRAVENOUS | Status: AC
  Filled 2015-09-05: qty 2

## 2015-09-05 MED ORDER — HEPARIN SODIUM (PORCINE) 1000 UNIT/ML IJ SOLN
INTRAMUSCULAR | Status: AC
  Filled 2015-09-05: qty 1

## 2015-09-05 MED ORDER — GUAIFENESIN-DM 100-10 MG/5ML PO SYRP: 15.0000 mL | ORAL_SOLUTION | ORAL | Status: DC | PRN

## 2015-09-05 MED ORDER — HEPARIN SODIUM (PORCINE) 1000 UNIT/ML IJ SOLN
INTRAMUSCULAR | Status: DC | PRN
  Administered 2015-09-05: 9000 [IU] via INTRAVENOUS

## 2015-09-05 MED ORDER — BISACODYL 10 MG RE SUPP: 10.0000 mg | Freq: Every day | RECTAL | Status: DC | PRN

## 2015-09-05 MED ORDER — LIDOCAINE HCL (CARDIAC) 20 MG/ML IV SOLN
INTRAVENOUS | Status: DC | PRN
  Administered 2015-09-05: 50 mg via INTRAVENOUS

## 2015-09-05 MED ORDER — ONDANSETRON HCL 4 MG/2ML IJ SOLN
INTRAMUSCULAR | Status: AC
  Filled 2015-09-05: qty 2

## 2015-09-05 MED ORDER — VANCOMYCIN HCL IN DEXTROSE 1-5 GM/200ML-% IV SOLN
1000.0000 mg | Freq: Two times a day (BID) | INTRAVENOUS | Status: AC
  Administered 2015-09-05 – 2015-09-06 (×2): 1000 mg via INTRAVENOUS
  Filled 2015-09-05 (×2): qty 200

## 2015-09-05 MED ORDER — MAGNESIUM HYDROXIDE 400 MG/5ML PO SUSP: 30.0000 mL | Freq: Every day | ORAL | Status: DC | PRN

## 2015-09-05 MED ORDER — HYDRALAZINE HCL 20 MG/ML IJ SOLN: 5.0000 mg | INTRAMUSCULAR | Status: DC | PRN

## 2015-09-05 MED ORDER — PHENYLEPHRINE 40 MCG/ML (10ML) SYRINGE FOR IV PUSH (FOR BLOOD PRESSURE SUPPORT)
PREFILLED_SYRINGE | INTRAVENOUS | Status: AC
  Filled 2015-09-05: qty 10

## 2015-09-05 MED ORDER — OXYCODONE HCL 5 MG PO TABS
5.0000 mg | ORAL_TABLET | ORAL | Status: DC | PRN
  Administered 2015-09-05: 10 mg via ORAL
  Administered 2015-09-06 (×3): 5 mg via ORAL
  Filled 2015-09-05 (×3): qty 1
  Filled 2015-09-05: qty 2

## 2015-09-05 MED ORDER — DEXAMETHASONE SODIUM PHOSPHATE 10 MG/ML IJ SOLN
INTRAMUSCULAR | Status: AC
  Filled 2015-09-05: qty 1

## 2015-09-05 MED ORDER — SODIUM CHLORIDE 0.9 % IV SOLN
INTRAVENOUS | Status: DC
  Administered 2015-09-06: 06:00:00 via INTRAVENOUS

## 2015-09-05 MED ORDER — LISINOPRIL 10 MG PO TABS
10.0000 mg | ORAL_TABLET | Freq: Every day | ORAL | Status: DC
  Administered 2015-09-06: 10 mg via ORAL
  Filled 2015-09-05 (×2): qty 1

## 2015-09-05 MED ORDER — 0.9 % SODIUM CHLORIDE (POUR BTL) OPTIME
TOPICAL | Status: DC | PRN
  Administered 2015-09-05: 2000 mL

## 2015-09-05 MED ORDER — ROCURONIUM BROMIDE 50 MG/5ML IV SOLN
INTRAVENOUS | Status: AC
  Filled 2015-09-05: qty 1

## 2015-09-05 MED ORDER — ACETAMINOPHEN 325 MG PO TABS: 325.0000 mg | ORAL_TABLET | ORAL | Status: DC | PRN

## 2015-09-05 MED ORDER — ALUM & MAG HYDROXIDE-SIMETH 200-200-20 MG/5ML PO SUSP: 15.0000 mL | ORAL | Status: DC | PRN

## 2015-09-05 MED ORDER — STERILE WATER FOR INJECTION IJ SOLN
INTRAMUSCULAR | Status: AC
  Filled 2015-09-05: qty 10

## 2015-09-05 MED ORDER — NITROGLYCERIN 0.2 MG/ML ON CALL CATH LAB
INTRAVENOUS | Status: DC | PRN
  Administered 2015-09-05 (×3): 40 ug via INTRAVENOUS

## 2015-09-05 MED ORDER — HYDROMORPHONE HCL 1 MG/ML IJ SOLN
0.2500 mg | INTRAMUSCULAR | Status: DC | PRN
  Administered 2015-09-05 (×3): 0.5 mg via INTRAVENOUS

## 2015-09-05 MED ORDER — POTASSIUM CHLORIDE CRYS ER 20 MEQ PO TBCR: 20.0000 meq | EXTENDED_RELEASE_TABLET | Freq: Every day | ORAL | Status: DC | PRN

## 2015-09-05 MED ORDER — ONDANSETRON HCL 4 MG/2ML IJ SOLN
INTRAMUSCULAR | Status: DC | PRN
  Administered 2015-09-05: 4 mg via INTRAVENOUS

## 2015-09-05 MED ORDER — PROMETHAZINE HCL 25 MG/ML IJ SOLN: 6.2500 mg | INTRAMUSCULAR | Status: DC | PRN

## 2015-09-05 MED ORDER — PANTOPRAZOLE SODIUM 40 MG PO TBEC
40.0000 mg | DELAYED_RELEASE_TABLET | Freq: Every day | ORAL | Status: DC
  Administered 2015-09-05 – 2015-09-06 (×2): 40 mg via ORAL
  Filled 2015-09-05 (×2): qty 1

## 2015-09-05 MED ORDER — ATORVASTATIN CALCIUM 20 MG PO TABS
20.0000 mg | ORAL_TABLET | Freq: Every day | ORAL | Status: DC
  Administered 2015-09-05: 20 mg via ORAL
  Filled 2015-09-05: qty 1

## 2015-09-05 MED ORDER — MORPHINE SULFATE (PF) 2 MG/ML IV SOLN
2.0000 mg | INTRAVENOUS | Status: DC | PRN
  Administered 2015-09-05: 2 mg via INTRAVENOUS
  Filled 2015-09-05: qty 1

## 2015-09-05 MED ORDER — SODIUM CHLORIDE 0.9 % IV SOLN
0.0125 ug/kg/min | INTRAVENOUS | Status: AC
  Administered 2015-09-05: .05 ug/kg/min via INTRAVENOUS
  Filled 2015-09-05: qty 2000

## 2015-09-05 MED ORDER — ACETAMINOPHEN 650 MG RE SUPP: 325.0000 mg | RECTAL | Status: DC | PRN

## 2015-09-05 MED ORDER — PROPOFOL 10 MG/ML IV BOLUS
INTRAVENOUS | Status: DC | PRN
  Administered 2015-09-05: 150 mg via INTRAVENOUS

## 2015-09-05 SURGICAL SUPPLY — 32 items
CANISTER SUCTION 2500CC (MISCELLANEOUS) ×3 IMPLANT
CANNULA VESSEL 3MM 2 BLNT TIP (CANNULA) ×3 IMPLANT
CATH ROBINSON RED A/P 18FR (CATHETERS) ×3 IMPLANT
CLIP TI MEDIUM 6 (CLIP) ×3 IMPLANT
CLIP TI WIDE RED SMALL 6 (CLIP) ×3 IMPLANT
CRADLE DONUT ADULT HEAD (MISCELLANEOUS) ×3 IMPLANT
ELECT REM PT RETURN 9FT ADLT (ELECTROSURGICAL) ×3
ELECTRODE REM PT RTRN 9FT ADLT (ELECTROSURGICAL) ×2 IMPLANT
GLOVE BIO SURGEON STRL SZ 6.5 (GLOVE) ×2 IMPLANT
GLOVE BIO SURGEON STRL SZ7.5 (GLOVE) ×3 IMPLANT
GLOVE BIOGEL PI IND STRL 6.5 (GLOVE) IMPLANT
GLOVE BIOGEL PI IND STRL 7.0 (GLOVE) IMPLANT
GLOVE BIOGEL PI INDICATOR 6.5 (GLOVE) ×1
GLOVE BIOGEL PI INDICATOR 7.0 (GLOVE) ×1
GLOVE ECLIPSE 6.5 STRL STRAW (GLOVE) ×1 IMPLANT
GOWN STRL REUS W/ TWL LRG LVL3 (GOWN DISPOSABLE) ×6 IMPLANT
GOWN STRL REUS W/TWL LRG LVL3 (GOWN DISPOSABLE) ×9
KIT BASIN OR (CUSTOM PROCEDURE TRAY) ×3 IMPLANT
KIT ROOM TURNOVER OR (KITS) ×3 IMPLANT
LIQUID BAND (GAUZE/BANDAGES/DRESSINGS) ×3 IMPLANT
NS IRRIG 1000ML POUR BTL (IV SOLUTION) ×6 IMPLANT
PACK CAROTID (CUSTOM PROCEDURE TRAY) ×3 IMPLANT
PAD ARMBOARD 7.5X6 YLW CONV (MISCELLANEOUS) ×6 IMPLANT
PATCH HEMASHIELD 8X75 (Vascular Products) ×1 IMPLANT
SHUNT CAROTID BYPASS 10 (VASCULAR PRODUCTS) ×1 IMPLANT
SUT PROLENE 6 0 CC (SUTURE) ×4 IMPLANT
SUT SILK 3 0 (SUTURE) ×3
SUT SILK 3-0 18XBRD TIE 12 (SUTURE) IMPLANT
SUT VIC AB 3-0 SH 27 (SUTURE) ×3
SUT VIC AB 3-0 SH 27X BRD (SUTURE) ×2 IMPLANT
SUT VICRYL 4-0 PS2 18IN ABS (SUTURE) ×3 IMPLANT
WATER STERILE IRR 1000ML POUR (IV SOLUTION) ×3 IMPLANT

## 2015-09-05 NOTE — H&P (Signed)
     Established Carotid Patient  History of Present Illness  Clinton [name] is a 60 y.o. (1956/03/22) male who presents for follow-up of his carotid artery disease. We have been following a moderate right carotid stenosis since 2014. It has remained asymptomatic.   Patient has no history of TIA or stroke symptom.  The patient has not had amaurosis fugax or monocular blindness.  He has had a 10 mm gradient in his right arm compared to his left in the past. This has always been asymptomatic.  He is active and exercises daily. He is not a smoker. Chronic medical problems continue to remain hyperlipidemia hypertension but which are currently controlled.       Past Medical History   Diagnosis  Date   .  Cellulitis     .  Hypertension     .  Hyperlipidemia     .  Carotid artery occlusion     .  Erectile dysfunction      Past Surgical History   Procedure  Laterality  Date   .  Tonsillectomy       .  Vasectomy        Current Outpatient Prescriptions on File Prior to Visit   Medication  Sig  Dispense  Refill   .  aspirin 81 MG tablet  Take 81 mg by mouth daily.       [name] Kitchen  atorvastatin (LIPITOR) 10 MG tablet         .  lisinopril (PRINIVIL,ZESTRIL) 10 MG tablet  Take 10 mg by mouth daily.       .  rosuvastatin (CRESTOR) 10 MG tablet  Take 10 mg by mouth daily.       [name] Kitchen  VIAGRA 100 MG tablet      5      No current facility-administered medications on file prior to visit.       Allergies   Allergen  Reactions   .  Penicillins  Rash     Physical Examination    Filed Vitals:   09/05/15 0712  BP: 146/53  Pulse: 64  Temp: 98.6 F (37 C)  TempSrc: Oral  Resp: 20  Height:  (1.753 m)  Weight: 201 lb (91.173 kg)  SpO2: 99%    Neck: No carotid bruits Chest: Clear to auscultation bilaterally Cardiac: RRR Vascular:2+ radial pulses b/l Musculoskeletal: M/S 5/5 throughout.   Neurologic: Upper 5 motor strength upper and lower extremities symmetric no facial asymmetry  Data:  Patient had repeat carotid duplex exam today. Velocities on the right side at 333/94. He is been slowly progressively increasing every 6 months for the last 2 years. Calcific plaque probably obscures higher velocities. Contralateral side has no significant stenosis, vertebral flow antegrade bilaterally  Assessment: Asymptomatic high-grade right internal carotid artery stenosis progressive over the last 2 years Plan: She will continue his aspirin. We will have him evaluated for cardiac risk stratification. Plan will be for right carotid endarterectomy after cardiology evaluation. Risks benefits possible complications and procedure details of right carotid endarterectomy were explained to the patient today. These include but are not limited to bleeding infection stroke risk 1-2% cranial nerve injury risk 5-10% myocardial risk 5%. He understands and agrees to proceed.  Fabienne Bruns, MD [facility] [place] Office: [phone] Pager: [phone]

## 2015-09-05 NOTE — Progress Notes (Cosign Needed)
  [facility] Progress Note  09/05/2015 3:51 PM Day of Surgery  Subjective:  Minimal pain and has been taking medications to stay "above the curve." Feels well and is ready to go home and start working again.   Filed Vitals:   09/05/15 1446 09/05/15 1510  BP: 105/55 103/54  Pulse: 69 64  Temp: 97.2 F (36.2 C) 98.5 F (36.9 C)  Resp: 12 15     Physical Exam: Neuro:  5/5 strength in upper and lower extremities, no facial asymmetry Incision:  Clean, dry and intact. No evidence of hematoma or infection  CBC    Component Value Date/Time   WBC 4.4 08/27/2015 1331   RBC 4.44 08/27/2015 1331   HGB 14.5 08/27/2015 1331   HCT 42.9 08/27/2015 1331   PLT 195 08/27/2015 1331   MCV 96.6 08/27/2015 1331   MCH 32.7 08/27/2015 1331   MCHC 33.8 08/27/2015 1331   RDW 12.2 08/27/2015 1331    BMET    Component Value Date/Time   NA 141 08/27/2015 1331   K 4.3 08/27/2015 1331   CL 105 08/27/2015 1331   CO2 29 08/27/2015 1331   GLUCOSE 98 08/27/2015 1331   BUN 11 08/27/2015 1331   CREATININE 1.13 08/27/2015 1331   CALCIUM 9.3 08/27/2015 1331   GFRNONAA >60 08/27/2015 1331   GFRAA >60 08/27/2015 1331     Intake/Output Summary (Last 24 hours) at 09/05/15 1551 Last data filed at 09/05/15 1400  Gross per 24 hour  Intake   1200 ml  Output   1050 ml  Net    150 ml      Assessment/Plan:  This is a 60 y.o. male who is s/p Right CEA Day of Surgery  -Patient is doing well this am. -Neuro exam is intact -has voided   Waynetta Sandy, PA-S

## 2015-09-05 NOTE — Anesthesia Procedure Notes (Signed)
Procedure Name: Intubation Date/Time: 09/05/2015 8:47 AM Performed by: Little Ishikawa L Pre-anesthesia Checklist: Patient identified, Timeout performed, Emergency Drugs available, Suction available and Patient being monitored Patient Re-evaluated:Patient Re-evaluated prior to inductionOxygen Delivery Method: Circle system utilized Preoxygenation: Pre-oxygenation with 100% oxygen Intubation Type: IV induction Ventilation: Mask ventilation without difficulty Laryngoscope Size: Mac and 4 Grade View: Grade II Tube type: Oral Tube size: 7.5 mm Number of attempts: 1 Airway Equipment and Method: Stylet Placement Confirmation: ETT inserted through vocal cords under direct vision,  breath sounds checked- equal and bilateral and positive ETCO2 Secured at: 23 cm Tube secured with: Tape Dental Injury: Teeth and Oropharynx as per pre-operative assessment

## 2015-09-05 NOTE — Op Note (Signed)
Procedure Right carotid endarterectomy  Preoperative diagnosis: High-grade asymptomatic right internal carotid artery stenosis  Postoperative diagnosis: Same  Anesthesia General  Asst.: Doreatha Massed, PAC  Operative findings: #1 greater than 80% right internal carotid stenosis                                                            #2 Dacron patch           #3 10 Fr shunt  Operative details: After obtaining informed consent, the patient was taken to the operating room. The patient was placed in a supine position on the operating room table. After induction of general anesthesia and endotracheal intubation a Foley catheter was placed. Next the patient's entire neck and chest was prepped and draped in the usual sterile fashion. An oblique incision was made on the right aspect of the patient's neck anterior to the border the right sternocleidomastoid muscle. The incision was carried into the subcutaneous tissues and through the platysma. The sternocleidomastoid muscle was identified and reflected laterally. The common carotid artery was then found at the base of the incision and this was dissected free circumferentially. It was fairly soft on palpation.  The vagus nerve was identified and protected. Dissection was then carried up to the level carotid bifurcation. The ansa was draped across this but I was able to mobilize it without transecting it.  The hyperglossal nerve was well above the primary area of dissection. The internal carotid artery was dissected free circumferentially just below the level of the hypoglossal nerve and it was soft in character at this location and above any palpable disease. A vessel loop was placed around this. The vessel was fairly small. Next the external carotid and superior thyroid arteries were dissected free circumferentially and vessel loops were placed around these. The patient was given 9000 units of intravenous heparin.  After 2 minutes of circulation time and  raising the mean arterial pressure to 90 mm mercury, the distal internal carotid artery was controlled with small bulldog clamp. The external carotid and superior thyroid arteries were controlled with vessel loops. The common carotid artery was controlled with a peripheral DeBakey clamp. A longitudinal opening was made in the common carotid artery just below the bifurcation. The arteriotomy was extended distally up into the internal carotid with Potts scissors. There was a large calcified plaque with greater than 80% stenosis in the internal carotid.  A 10 Fr shunt was brought onto the field and fashioned to fit the patient's artery.  This was threaded into the distal internal carotid artery and allowed to backbleed thoroughly.  There was pulsatile backbleeding.  This was then threaded into the common carotid and secured with a Rummel tourniquet.  There was no air at this point and flow was restored to the brain.  Attention was then turned to the common carotid artery once again. A suitable endarterectomy plane was obtained and endarterectomy was begun in the common carotid artery and a good proximal endpoint was obtained. An eversion endarterectomy was performed on the external carotid artery and a good endpoint was obtained. The plaque was then elevated in the internal carotid artery and a nice feathered distal endpoint was also obtained.  The plaque was passed off the table. All loose debris was then removed from the carotid bed  and everything was thoroughly irrigated with heparinized saline. A Dacron patch was then brought on to the operative field and this was sewn on as a patch angioplasty using a running 6-0 Prolene suture. Prior to completion of the anastomosis the internal carotid artery was thoroughly backbled. This was then controlled again with a fine bulldog clamp.  The common carotid was thoroughly flushed forward. The external carotid was also thoroughly backbled.  The remainder of the patch was  completed and the anastomosis was secured. Flow was then restored first retrograde from the external carotid into the carotid bed then antegrade from the common carotid to the external carotid artery and after approximately 5 cardiac cycles to the internal carotid artery. Doppler was used to evaluate the external/internal and common carotid arteries and these all had good Doppler flow. Hemostasis was obtained. The patient was also given 90 mg of Protamine.      The platysma muscle was reapproximated using a running 3-0 Vicryl suture. The skin was closed with 4 0 Vicryl subcuticular stitch.  The patient was awakened in the operating room and was moving upper and lower extremities symmetrically and following commands.  The patient was stable on arrival to the PACU.  Fabienne Bruns, MD [facility] [place] Office: [phone] Pager: [phone]

## 2015-09-05 NOTE — Transfer of Care (Signed)
Immediate Anesthesia Transfer of Care Note  Patient: Clinton [name]  Procedure(s) Performed: Procedure(s): ENDARTERECTOMY CAROTID RIGHT (Right) PATCH ANGIOPLASTY RIGHT CAROTID ARTERY USING HEMASHIELD PLATINUM FINESSE PATCH  Patient Location: PACU  Anesthesia Type:General  Level of Consciousness: awake, alert  and oriented  Airway & Oxygen Therapy: Patient Spontanous Breathing and Patient connected to nasal cannula oxygen  Post-op Assessment: Report given to RN, Post -op Vital signs reviewed and stable, Patient moving all extremities X 4 and Patient able to stick tongue midline  Post vital signs: stable  Last Vitals:  Filed Vitals:   09/05/15 0712  BP: 146/53  Pulse: 64  Temp: 37 C  Resp: 20    Complications: No apparent anesthesia complications

## 2015-09-05 NOTE — Anesthesia Postprocedure Evaluation (Signed)
Anesthesia Post Note  Patient: Clinton [name]  Procedure(s) Performed: Procedure(s) (LRB): ENDARTERECTOMY CAROTID RIGHT (Right) PATCH ANGIOPLASTY RIGHT CAROTID ARTERY USING HEMASHIELD PLATINUM FINESSE PATCH  Patient location during evaluation: PACU Anesthesia Type: General Level of consciousness: awake and alert Pain management: pain level controlled Vital Signs Assessment: post-procedure vital signs reviewed and stable Respiratory status: spontaneous breathing, nonlabored ventilation, respiratory function stable and patient connected to nasal cannula oxygen Cardiovascular status: blood pressure returned to baseline and stable Postop Assessment: no signs of nausea or vomiting Anesthetic complications: no    Last Vitals:  Filed Vitals:   09/05/15 1145 09/05/15 1200  BP: 115/64 117/60  Pulse: 77 69  Temp:    Resp: 21 14    Last Pain:  Filed Vitals:   09/05/15 1212  PainSc: 4                  Alazay Leicht S

## 2015-09-05 NOTE — Addendum Note (Signed)
Addendum  created 09/05/15 1444 by Epifanio Lesches, CRNA   Modules edited: Anesthesia Events, Narrator   Narrator:  Narrator: Event Log Edited

## 2015-09-05 NOTE — Anesthesia Preprocedure Evaluation (Signed)
Anesthesia Evaluation  Patient identified by MRN, date of birth, ID band Patient awake    Reviewed: Allergy & Precautions, NPO status , Patient's Chart, lab work & pertinent test results  Airway Mallampati: II  TM Distance: >3 FB Neck ROM: Full    Dental no notable dental hx.    Pulmonary neg pulmonary ROS, former smoker,    Pulmonary exam normal breath sounds clear to auscultation       Cardiovascular hypertension, Pt. on medications + Peripheral Vascular Disease  negative cardio ROS Normal cardiovascular exam Rhythm:Regular Rate:Normal     Neuro/Psych negative neurological ROS  negative psych ROS   GI/Hepatic negative GI ROS, Neg liver ROS,   Endo/Other  negative endocrine ROS  Renal/GU negative Renal ROS  negative genitourinary   Musculoskeletal negative musculoskeletal ROS (+)   Abdominal   Peds negative pediatric ROS (+)  Hematology negative hematology ROS (+)   Anesthesia Other Findings   Reproductive/Obstetrics negative OB ROS                             Anesthesia Physical Anesthesia Plan  ASA: III  Anesthesia Plan: General   Post-op Pain Management:    Induction: Intravenous  Airway Management Planned: Oral ETT  Additional Equipment: Arterial line  Intra-op Plan:   Post-operative Plan: Extubation in OR  Informed Consent: I have reviewed the patients History and Physical, chart, labs and discussed the procedure including the risks, benefits and alternatives for the proposed anesthesia with the patient or authorized representative who has indicated his/her understanding and acceptance.   Dental advisory given  Plan Discussed with: CRNA and Surgeon  Anesthesia Plan Comments:         Anesthesia Quick Evaluation

## 2015-09-06 ENCOUNTER — Encounter (HOSPITAL_COMMUNITY): Payer: Self-pay | Admitting: Vascular Surgery

## 2015-09-06 LAB — BASIC METABOLIC PANEL
ANION GAP: 10 (ref 5–15)
BUN: 6 mg/dL (ref 6–20)
CO2: 25 mmol/L (ref 22–32)
Calcium: 8.5 mg/dL — ABNORMAL LOW (ref 8.9–10.3)
Chloride: 105 mmol/L (ref 101–111)
Creatinine, Ser: 1.08 mg/dL (ref 0.61–1.24)
GFR calc non Af Amer: >60 mL/min (ref >60)
Glucose, Bld: 104 mg/dL — ABNORMAL HIGH (ref 65–99)
POTASSIUM: 3.8 mmol/L (ref 3.5–5.1)
Sodium: 140 mmol/L (ref 135–145)

## 2015-09-06 LAB — CBC
HEMATOCRIT: 35.6 % — AB (ref 39.0–52.0)
HEMOGLOBIN: 11.9 g/dL — AB (ref 13.0–17.0)
MCH: 32.4 pg (ref 26.0–34.0)
MCHC: 33.4 g/dL (ref 30.0–36.0)
MCV: 97 fL (ref 78.0–100.0)
Platelets: 181 10*3/uL (ref 150–400)
RBC: 3.67 MIL/uL — AB (ref 4.22–5.81)
RDW: 12.2 % (ref 11.5–15.5)
WBC: 8.1 10*3/uL (ref 4.0–10.5)

## 2015-09-06 NOTE — Care Management Note (Signed)
Case Management Note  Patient Details  Name: DARAY POLGAR MRN: [id number] Date of Birth: Feb 22, 1956  Subjective/Objective:     Date: 09/06/15 Spoke with patient at the bedside along with wife.  Introduced self as Sports coach and explained role in discharge planning and how to be reached.  Verified patient lives in town,  with spouse, pta indep.  Expressed potential need for no other DME.  Verified patient anticipates to go home with family, at time of discharge and will have full-time supervision by family at this time to best of their knowledge. Patient denied needing help with their medication.  Patient  is driven by wife to MD appointments.  Verified patient has PCP Carilyn Goodpasture.  Patient is for dc today and has no needs.  Plan: CM will continue to follow for discharge planning and [facility] resources.                Action/Plan:   Expected Discharge Date:                  Expected Discharge Plan:  Home/Self Care  In-House Referral:     Discharge planning Services  CM Consult  Post Acute Care Choice:    Choice offered to:     DME Arranged:    DME Agency:     HH Arranged:    HH Agency:     Status of Service:  Completed, signed off  Medicare Important Message Given:    Date Medicare IM Given:    Medicare IM give by:    Date Additional Medicare IM Given:    Additional Medicare Important Message give by:     If discussed at Long Length of Stay Meetings, dates discussed:    Additional Comments:  Leone Haven, RN 09/06/2015, 10:10 AM

## 2015-09-06 NOTE — Progress Notes (Addendum)
  Progress Note    09/06/2015 7:27 AM 1 Day Post-Op  Subjective:  No complaints; denies trouble swallowing  afebrile HR 50's-60's NSR 90's-100's systolic 95% 2LO2NC  Filed Vitals:   09/06/15 0400 09/06/15 0600  BP: 91/47 91/50  Pulse: 55 61  Temp: 98.2 F (36.8 C)   Resp: 14 11     Physical Exam: Neuro:  In tact Lungs:  Non labored Incision:  C/d/i-mild puffiness at distal incision  CBC    Component Value Date/Time   WBC 8.1 09/06/2015 0445   RBC 3.67* 09/06/2015 0445   HGB 11.9* 09/06/2015 0445   HCT 35.6* 09/06/2015 0445   PLT 181 09/06/2015 0445   MCV 97.0 09/06/2015 0445   MCH 32.4 09/06/2015 0445   MCHC 33.4 09/06/2015 0445   RDW 12.2 09/06/2015 0445    BMET    Component Value Date/Time   NA 140 09/06/2015 0445   K 3.8 09/06/2015 0445   CL 105 09/06/2015 0445   CO2 25 09/06/2015 0445   GLUCOSE 104* 09/06/2015 0445   BUN 6 09/06/2015 0445   CREATININE 1.08 09/06/2015 0445   CALCIUM 8.5* 09/06/2015 0445   GFRNONAA >60 09/06/2015 0445   GFRAA >60 09/06/2015 0445     Intake/Output Summary (Last 24 hours) at 09/06/15 0727 Last data filed at 09/06/15 9562  Gross per 24 hour  Intake   2300 ml  Output   2925 ml  Net   -625 ml     Assessment/Plan:  This is a 60 y.o. male who is s/p right CEA 1 Day Post-Op  -pt is doing well this am. -pt neuro exam is in tact -BP is soft with systolic in the 90's-will have pt hold his Lisinopril until his BP starts to rebound back.  They will check his BP twice a day and start back when it is trending around 120 systolic -pt has not ambulated-needs to ambulate this morning -pt has voided -f/u with [name] in 2 weeks.   Doreatha Massed, PA-C [facility] [phone]   Agree with above.  No hematoma.  No neuro deficits. D.c home today  Fabienne Bruns, MD [facility] [place] Office: [phone] Pager: [phone]

## 2015-09-06 NOTE — Discharge Summary (Signed)
Discharge Summary     Clinton [name] 04/29/1956 60 y.o. male  [id number]  Admission Date: 09/05/2015  Discharge Date: 09/06/15  Physician: Sherren Kerns, MD  Admission Diagnosis: Right carotid artery stenosis I65.21   HPI:   This is a 60 y.o. male who presents for follow-up of his carotid artery disease. We have been following a moderate right carotid stenosis since 2014. It has remained asymptomatic.  Patient has no history of TIA or stroke symptom. The patient has not had amaurosis fugax or monocular blindness. He has had a 10 mm gradient in his right arm compared to his left in the past. This has always been asymptomatic. He is active and exercises daily. He is not a smoker. Chronic medical problems continue to remain hyperlipidemia hypertension but which are currently controlled.  Hospital Course:  The patient was admitted to the hospital and taken to the operating room on 09/05/2015 and underwent right carotid endarterectomy.  Intraoperative findings included greater than 80% right ICA stenosis.  The pt tolerated the procedure well and was transported to the PACU in good condition.   By POD 1, the pt neuro status is in tact.  His blood pressure is soft with systolic running in the 90's-100's.  His Lisinopril has been held.  He is advised to check his BP at home and resume his Lisinopril when he is around 120 systolic.  He and his wife expressed understanding.  The remainder of the hospital course consisted of increasing mobilization and increasing intake of solids without difficulty.    Recent Labs  09/06/15 0445  NA 140  K 3.8  CL 105  CO2 25  GLUCOSE 104*  BUN 6  CALCIUM 8.5*    Recent Labs  09/06/15 0445  WBC 8.1  HGB 11.9*  HCT 35.6*  PLT 181   No results for input(s): INR in the last 72 hours.   Discharge Instructions    CAROTID Sugery: Call MD for difficulty swallowing or speaking; weakness in arms or legs that is a new symtom; severe  headache.  If you have increased swelling in the neck and/or  are having difficulty breathing, CALL 911    Complete by:  As directed      Call MD for:  redness, tenderness, or signs of infection (pain, swelling, bleeding, redness, odor or green/yellow discharge around incision site)    Complete by:  As directed      Call MD for:  severe or increased pain, loss or decreased feeling  in affected limb(s)    Complete by:  As directed      Call MD for:  temperature >100.5    Complete by:  As directed      Discharge instructions    Complete by:  As directed   Resume Lisinopril when systolic blood pressure is around 120.     Discharge wound care:    Complete by:  As directed   Shower daily with soap and water starting 09/07/15     Driving Restrictions    Complete by:  As directed   No driving for 2 weeks     Lifting restrictions    Complete by:  As directed   No lifting for 2 weeks     Resume previous diet    Complete by:  As directed            Discharge Diagnosis:  Right carotid artery stenosis I65.21  Secondary Diagnosis: Patient Active Problem List   Diagnosis Date Noted  .  Carotid artery stenosis, asymptomatic 09/05/2015  . Cellulitis   . Hypertension   . Hyperlipidemia   . Occlusion and stenosis of carotid artery without mention of cerebral infarction 01/01/2012   Past Medical History  Diagnosis Date  . Cellulitis   . Hypertension   . Hyperlipidemia   . Carotid artery occlusion   . Erectile dysfunction       Medication List    TAKE these medications        aspirin 81 MG tablet  Take 81 mg by mouth daily.     atorvastatin 10 MG tablet  Commonly known as:  LIPITOR  Take 20 mg by mouth daily at 6 PM.     lisinopril 10 MG tablet  Commonly known as:  PRINIVIL,ZESTRIL  Take 10 mg by mouth daily.     oxyCODONE 5 MG immediate release tablet  Commonly known as:  ROXICODONE  Take 1 tablet (5 mg total) by mouth every 6 (six) hours as needed.         Prescriptions given: Roxicodone #20 No Refill  Instructions: 1.  Shower daily with soap and water starting 09/07/15 2.  No driving or heavy lifting x 2 weeks  Disposition: home  Patient's condition: is Good  Follow up: 1. [name] in 2 weeks.   Doreatha Massed, PA-C [facility] [phone]  --- For [facility] use --- Instructions: Press F2 to tab through selections.  Delete question if not applicable.   Modified Rankin score at D/C (0-6): 0  IV medication needed for:  1. Hypertension: No 2. Hypotension: No  Post-op Complications: No  1. Post-op CVA or TIA: No  If yes: Event classification (right eye, left eye, right cortical, left cortical, verterobasilar, other): n/a  If yes: Timing of event (intra-op, <6 hrs post-op, >=6 hrs post-op, unknown): n/a  2. CN injury: No  If yes: CN n/a injuried   3. Myocardial infarction: No  If yes: Dx by (EKG or clinical, Troponin): n/a  4.  CHF: No  5.  Dysrhythmia (new): No  6. Wound infection: No  7. Reperfusion symptoms: No  8. Return to OR: No  If yes: return to OR for (bleeding, neurologic, other CEA incision, other): n/a  Discharge medications: Statin use:  Yes If No:  For Medical reasons,  Non-compliant,  Not-indicated ASA use:  Yes  If No:  For Medical reasons,  Non-compliant,  Not-indicated Beta blocker use:  No If No:  For Medical reasons,  Non-compliant,  Not-indicated ACE-Inhibitor use:  Yes If No:  For Medical reasons,  Non-compliant,  Not-indicated ARB use:  No P2Y12 Antagonist use: No,  Plavix,  Plasugrel,  Ticlopinine,  Ticagrelor,  Other,  No for medical reason,  Non-compliant,  Not-indicated Anti-coagulant use:  No,  Warfarin,  Rivaroxaban,  Dabigatran,  Other,  No for medical reason,  Non-compliant,  Not-indicated

## 2015-09-06 NOTE — Progress Notes (Signed)
Provided patient with discharge instructions. Pt and wife verbalized understanding. Forms signed and copies provided. Teach back method used to educate patient on the need to check BP prior to getting Lisinopril per MD orders SBP > 120. Pt and wife acknowledge understanding by stating, "we will get a BP cuff for home use." PIV removed. Surgical incision C/D/I and without signs of infection, swelling, or bleeding at time of discharge. CMT and eLink notified of discharge home.

## 2015-09-06 NOTE — Addendum Note (Signed)
Addendum  created 09/06/15 1610 by Epifanio Lesches, CRNA   Modules edited: Anesthesia LDA, Lines/Drains/Airways Properties Editor   Lines/Drains/Airways Properties Editor:  Properties of line/drain/airway/wound Arterial Line 09/05/15 Left Radial have been modified.; Properties of line/drain/airway/wound Peripheral IV 09/05/15 Left;Anterior Antecubital have been modified.; Properties of line/drain/airway/wound Peripheral IV 09/05/15 Right;Anterior Antecubital have been modified.

## 2015-09-06 NOTE — Progress Notes (Signed)
Arterial line removed. Pressure held. No bleeding noted. Will continue to monitor.

## 2015-09-06 NOTE — Progress Notes (Deleted)
Pt destating to lower 80's. Pt placed on 2L O2 via [place]. WIll continue to monitor.

## 2015-09-07 ENCOUNTER — Telehealth: Payer: Self-pay

## 2015-09-07 ENCOUNTER — Telehealth: Payer: Self-pay | Admitting: Vascular Surgery

## 2015-09-07 NOTE — Telephone Encounter (Signed)
-----   Message from Fredrich Birks sent at 09/06/2015  2:30 PM EST -----   ----- Message -----    From: Dara Lords, PA-C    Sent: 09/05/2015  11:08 AM      To: [facility]  S/p right CEA 09/05/15.  F/u with [facility] in 2 weeks.  Thanks, Lelon Mast

## 2015-09-07 NOTE — Telephone Encounter (Signed)
Spoke with pt, dpm °

## 2015-09-07 NOTE — Telephone Encounter (Signed)
Phone call from pt. With post discharge questions.  Questioned about numbness in right neck incision and below the chin.  Advised there would be numbness for a period of time, due to the nerves being cut @ incision site.  Denied any difficulty swallowing.  Denied any swelling in the throat.   Reported his incision looks okay.  Questioned why he feels a little unsteady when he is walking; suggested this could just be related to post anesthesia effects, dehydration, or decreased strength.  Encouraged to move about with caution, increase oral fluid intake and increase activity slowly.  Encour. To call office if any concerns or questions.  Verb. Understanding.

## 2015-09-14 ENCOUNTER — Encounter: Payer: Self-pay | Admitting: Vascular Surgery

## 2015-09-19 ENCOUNTER — Ambulatory Visit (INDEPENDENT_AMBULATORY_CARE_PROVIDER_SITE_OTHER): Payer: 59 | Admitting: Vascular Surgery

## 2015-09-19 ENCOUNTER — Encounter: Payer: Self-pay | Admitting: Vascular Surgery

## 2015-09-19 VITALS — BP 152/77 | HR 60 | Temp 97.8°F | Resp 16 | Ht 70.0 in | Wt 195.0 lb

## 2015-09-19 DIAGNOSIS — Z9889 Other specified postprocedural states: Secondary | ICD-10-CM

## 2015-09-19 NOTE — Progress Notes (Signed)
Filed Vitals:   09/19/15 1635 09/19/15 1637 09/19/15 1639  BP: 159/79 150/73 152/77  Pulse: 60 60 60  Temp: 97.8 F (36.6 C)    Resp: 16    Height:  (1.778 m)    Weight: 195 lb (88.451 kg)    SpO2: 98%

## 2015-09-20 ENCOUNTER — Encounter: Payer: 59 | Admitting: Vascular Surgery

## 2015-09-20 NOTE — Progress Notes (Signed)
Patient is a 60 year old male who returns for postoperative follow-up today. He recently underwent right carotid endarterectomy for asymptomatic high-grade carotid stenosis. He denies any symptoms of TIA amaurosis or stroke. He has some. Incisional numbness on the anterior aspect of the incision. He has had no drainage. He is currently on aspirin. His last new clots ultrasound showed less than 40% stenosis on the contralateral side.  Physical exam:  Filed Vitals:   09/19/15 1635 09/19/15 1637 09/19/15 1639  BP: 159/79 150/73 152/77  Pulse: 60 60 60  Temp: 97.8 F (36.6 C)    Resp: 16    Height:  (1.778 m)    Weight: 195 lb (88.451 kg)    SpO2: 98%      Neck: No carotid bruits healing right neck incision some of the Dermabond Lucas peelback today. There is also one stitch at the superior aspect of the incision was some inflammation around this was trimmed back.  Assessment: Doing well status post right carotid endarterectomy.  Plan: Follow-up 6 months carotid duplex scan. Patient will continue his aspirin.  Fabienne Bruns, MD [facility] [place] Office: [phone] Pager: [phone]

## 2015-11-08 ENCOUNTER — Encounter: Payer: Self-pay | Admitting: Podiatry

## 2015-11-08 ENCOUNTER — Ambulatory Visit (INDEPENDENT_AMBULATORY_CARE_PROVIDER_SITE_OTHER): Payer: 59 | Admitting: Podiatry

## 2015-11-08 VITALS — BP 129/76 | HR 66 | Resp 12

## 2015-11-08 DIAGNOSIS — I6529 Occlusion and stenosis of unspecified carotid artery: Secondary | ICD-10-CM

## 2015-11-08 DIAGNOSIS — I739 Peripheral vascular disease, unspecified: Secondary | ICD-10-CM

## 2015-11-08 DIAGNOSIS — Q828 Other specified congenital malformations of skin: Secondary | ICD-10-CM

## 2015-11-08 NOTE — Progress Notes (Signed)
   Subjective:    Patient ID: Clinton [name], male    DOB: 12/29/1955, 60 y.o.   MRN: [id number]  HPI: He presents today with a chief complaint of painful corns and calluses of plantar aspect of his bilateral foot. He states that his wife does this the majority of the time however she no longer will do it for him.    Review of Systems  Skin: Positive for color change.       Objective:   Physical Exam: Vital signs are stable alert and oriented 3. Pulses are strongly palpable. Neurologic sensorium is intact. Multiple porokeratotic lesions plantar aspect bilateral foot without open wounds. No signs of infection.      Assessment & Plan:  Porokeratosis bilateral.  Plan: Debridement of porokeratotic lesions bilateral. Follow up with us as needed.

## 2016-03-13 ENCOUNTER — Encounter: Payer: Self-pay | Admitting: Vascular Surgery

## 2016-03-20 ENCOUNTER — Encounter: Payer: Self-pay | Admitting: Vascular Surgery

## 2016-03-20 ENCOUNTER — Ambulatory Visit (INDEPENDENT_AMBULATORY_CARE_PROVIDER_SITE_OTHER): Payer: 59 | Admitting: Vascular Surgery

## 2016-03-20 ENCOUNTER — Ambulatory Visit (HOSPITAL_COMMUNITY)
Admission: RE | Admit: 2016-03-20 | Discharge: 2016-03-20 | Disposition: A | Discharge status: Home / Self Care | Payer: 59 | Source: Ambulatory Visit | Attending: Vascular Surgery | Admitting: Vascular Surgery

## 2016-03-20 VITALS — BP 122/70 | HR 56 | Temp 97.6°F | Resp 16 | Ht 70.0 in | Wt 196.0 lb

## 2016-03-20 DIAGNOSIS — I6521 Occlusion and stenosis of right carotid artery: Secondary | ICD-10-CM | POA: Diagnosis not present

## 2016-03-20 DIAGNOSIS — Z9889 Other specified postprocedural states: Secondary | ICD-10-CM | POA: Diagnosis not present

## 2016-03-20 DIAGNOSIS — I10 Essential (primary) hypertension: Secondary | ICD-10-CM | POA: Diagnosis not present

## 2016-03-20 DIAGNOSIS — E785 Hyperlipidemia, unspecified: Secondary | ICD-10-CM | POA: Diagnosis not present

## 2016-03-20 LAB — VAS US CAROTID
LCCAPDIAS: 19 cm/s
LCCAPSYS: 115 cm/s
LEFT ECA DIAS: -11 cm/s
LICADSYS: -99 cm/s
LICAPSYS: 126 cm/s
Left CCA dist dias: -27 cm/s
Left CCA dist sys: -125 cm/s
Left ICA dist dias: -26 cm/s
Left ICA prox dias: 25 cm/s
RCCADSYS: -97 cm/s
RIGHT CCA MID DIAS: 19 cm/s
RIGHT ECA DIAS: -11 cm/s
Right CCA prox dias: 11 cm/s
Right CCA prox sys: 84 cm/s

## 2016-03-20 NOTE — Progress Notes (Signed)
Patient is a 60 year old male who returns for follow-up today. He recently underwent right carotid endarterectomy January 2017  for asymptomatic high-grade carotid stenosis. He denies any symptoms of TIA amaurosis or stroke. He is currently on aspirin. He has stopped using all tobacco products.  Physical exam:    Vitals:   03/20/16 1544 03/20/16 1549  BP: 120/70 122/70  Pulse: (!) 56   Resp: 16   Temp: 97.6 F (36.4 C)   TempSrc: Oral   SpO2: 98%   Weight: 196 lb (88.9 kg)   Height: 5\' 10"  (1.778 m)      Neck: No carotid bruits healed right neck incision  Neuro: Symmetric upper and lower extremity motor strength 5 over 5 no facial asymmetry   Data: Carotid duplex exam today showed less than 40% on the left side no significant recurrent stenosis on the right side   Assessment: Doing well status post right carotid endarterectomy.   Plan: Follow-up 12 months carotid duplex scan. Patient will continue his aspirin.   Fabienne Brunsharles Erma Joubert, MD [facility] [place] Office: [phone] Pager: [phone]

## 2016-04-22 ENCOUNTER — Encounter: Payer: Self-pay | Admitting: Podiatry

## 2016-04-22 ENCOUNTER — Ambulatory Visit (INDEPENDENT_AMBULATORY_CARE_PROVIDER_SITE_OTHER): Payer: 59 | Admitting: Podiatry

## 2016-04-22 DIAGNOSIS — Q828 Other specified congenital malformations of skin: Secondary | ICD-10-CM | POA: Diagnosis not present

## 2016-04-22 NOTE — Progress Notes (Signed)
He presents today with chief complaint of pain to the fifth metatarsophalangeal joints bilaterally. He states that these lesions have grown back as he refers to the poor keratomas. He was disappointed that his insurance did not pay for debridement of these lesions.  Objective: Vital signs are stable he is alert and oriented 3. Pulses are palpable. Fifth metatarsal heads do demonstrate porokeratotic lesions to the plantar aspect. No open lesions or wounds. They are painful on palpation.  Assessment: Porokeratosis of fifth metatarsal heads bilateral. Mild tailor's bunion deformity bilateral.  Plan: Sharp debridement of these lesions today. Did discuss the possible need for surgical intervention which he declined. Also discussed the possible need for orthotics which she declined. I will follow-up with him on an as-needed basis.

## 2016-07-11 NOTE — Addendum Note (Signed)
Addended by: Burton ApleyPETTY, Breckin Zafar A on: 07/11/2016 03:55 PM   Modules accepted: Orders

## 2016-09-11 DIAGNOSIS — R197 Diarrhea, unspecified: Secondary | ICD-10-CM | POA: Diagnosis not present

## 2017-02-19 IMAGING — NM NM MISC PROCEDURE
6 series · 36 of 36 positions shown · non-contrast
Comparison: none

[Series 1: wbr_r-proj_st wbr rest · 6.40mm/px · 6 of 64 frames shown]
[frame 6/64]
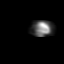
[frame 16/64]
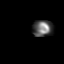
[frame 27/64]
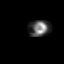
[frame 38/64]
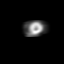
[frame 48/64]
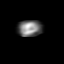
[frame 59/64]
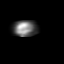

[Series 1: wbr rest · 6.40mm/px · 6 of 64 frames shown]
[frame 6/64]
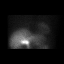
[frame 16/64]
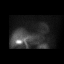
[frame 27/64]
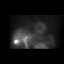
[frame 38/64]
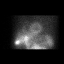
[frame 48/64]
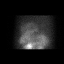
[frame 59/64]
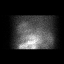

[Series 2: wbr_s-proj_st wbr stress-gsp · 6.40mm/px · 6 of 512 frames shown]
[frame 43/512]
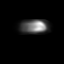
[frame 128/512]
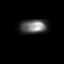
[frame 214/512]
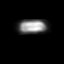
[frame 299/512]
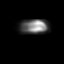
[frame 384/512]
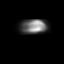
[frame 470/512]
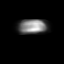

[Series 2: wbr stress-gsp · 6.40mm/px · 6 of 511 frames shown]
[frame 43/511]
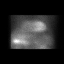
[frame 128/511]
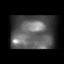
[frame 213/511]
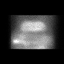
[frame 298/511]
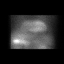
[frame 383/511]
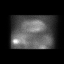
[frame 469/511]
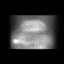

[Series 3: wbr_s-proj_st wbr stress-sum-em · 6.40mm/px · 6 of 64 frames shown]
[frame 6/64]
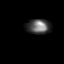
[frame 16/64]
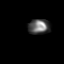
[frame 27/64]
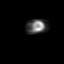
[frame 38/64]
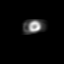
[frame 48/64]
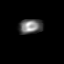
[frame 59/64]
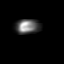

[Series 3: wbr stress-sum-em · 6.40mm/px · 6 of 64 frames shown]
[frame 6/64]
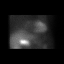
[frame 16/64]
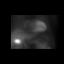
[frame 27/64]
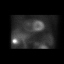
[frame 38/64]
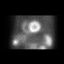
[frame 48/64]
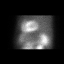
[frame 59/64]
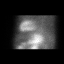

[36 of 36 positions shown; findings below may reference images not displayed]

Canned report from images found in remote index.

Refer to host system for actual result text.

## 2017-03-10 ENCOUNTER — Ambulatory Visit: Payer: 59 | Admitting: Podiatry

## 2017-03-26 ENCOUNTER — Ambulatory Visit: Payer: 59 | Admitting: Vascular Surgery

## 2017-03-26 ENCOUNTER — Encounter (HOSPITAL_COMMUNITY): Payer: 59

## 2017-04-21 ENCOUNTER — Ambulatory Visit: Payer: 59 | Admitting: Podiatry

## 2017-04-23 ENCOUNTER — Encounter: Payer: Self-pay | Admitting: Podiatry

## 2017-04-23 ENCOUNTER — Ambulatory Visit (INDEPENDENT_AMBULATORY_CARE_PROVIDER_SITE_OTHER): Payer: 59 | Admitting: Podiatry

## 2017-04-23 DIAGNOSIS — Q828 Other specified congenital malformations of skin: Secondary | ICD-10-CM | POA: Diagnosis not present

## 2017-04-23 NOTE — Progress Notes (Signed)
He presents today with his wife chief complaint of a painful porokeratotic lesion plantar aspect of the fifth metatarsophalangeal joint left foot. He is also concerned about this dry scaly skin.  Objective: Vital signs are stable he is alert and oriented 3. Evaluating issues and I am able to tell that he is walking on the scene between the upper and the sole. This is more likely was resulting in his reactive hyperkeratotic is of the pain. He does have tailor's bunion deformities that are present and a benign porokeratotic lesion. He also has dry scaly skin plantar aspect of the foot and his wife states that he has scaly skin and itching in his groin.  Assessment: Painful porokeratosis with tailor's bunion deformities. Tinea pedis.  Plan: Recommended Lamisil AT over-the-counter cream to be utilized for the fungus Monistat for the fungus as well he has this at home. I debrided the benign porokeratotic lesion left foot. I discussed appropriate shoe gear. Follow up with me as needed.

## 2017-04-26 DIAGNOSIS — Z23 Encounter for immunization: Secondary | ICD-10-CM | POA: Diagnosis not present

## 2017-04-27 ENCOUNTER — Ambulatory Visit (HOSPITAL_COMMUNITY)
Admission: RE | Admit: 2017-04-27 | Discharge: 2017-04-27 | Disposition: A | Discharge status: Home / Self Care | Payer: 59 | Source: Ambulatory Visit | Attending: Vascular Surgery | Admitting: Vascular Surgery

## 2017-04-27 DIAGNOSIS — I6521 Occlusion and stenosis of right carotid artery: Secondary | ICD-10-CM | POA: Diagnosis not present

## 2017-04-30 ENCOUNTER — Ambulatory Visit: Payer: 59 | Admitting: Vascular Surgery

## 2017-04-30 ENCOUNTER — Encounter (HOSPITAL_COMMUNITY): Payer: 59

## 2017-05-07 ENCOUNTER — Encounter: Payer: Self-pay | Admitting: Vascular Surgery

## 2017-05-07 ENCOUNTER — Ambulatory Visit (INDEPENDENT_AMBULATORY_CARE_PROVIDER_SITE_OTHER): Payer: 59 | Admitting: Vascular Surgery

## 2017-05-07 VITALS — BP 138/72 | HR 72 | Temp 98.0°F | Resp 20 | Ht 70.0 in | Wt 213.0 lb

## 2017-05-07 DIAGNOSIS — I6521 Occlusion and stenosis of right carotid artery: Secondary | ICD-10-CM

## 2017-05-07 NOTE — Progress Notes (Signed)
Patient is a 61 year old male who returns for follow-up today. He underwent right carotid endarterectomy approximately 18 months ago for an asymptomatic stenosis 09/05/15. He denies any symptoms of TIA amaurosis or stroke. He continues to take his aspirin and statin daily. He is not using any tobacco products. He had some concerns today that his brother has had coronary disease at an early age and now required bypass. He personally currently denies any shortness of breath or chest pain or differences in his exercise tolerance.  Current Outpatient Prescriptions on File Prior to Visit  Medication Sig Dispense Refill  . aspirin 81 MG tablet Take 81 mg by mouth daily.    [name] Kitchen atorvastatin (LIPITOR) 40 MG tablet     . JUBLIA 10 % SOLN APPLY TO THE AFFECTED AREA DAILY  3  . lisinopril (PRINIVIL,ZESTRIL) 10 MG tablet Take 10 mg by mouth daily.     No current facility-administered medications on file prior to visit.    Review of systems: He denies shortness of breath. He denies chest pain.  Physical exam:  Vitals:   05/07/17 0913 05/07/17 0915  BP: 138/78 138/72  Pulse: 72   Resp: 20   Temp: 98 F (36.7 C)   TempSrc: Core (Comment)   SpO2: 99%   Weight: 213 lb (96.6 kg)   Height:  (1.778 m)     Neck: Well-healed incision no carotid bruits  Chest: Clear to auscultation bilaterally  Cardiac: Regular rate without murmur  Abdomen: Soft nontender nondistended no mass  Extremities: Trace edema bilaterally feet pink warm well perfused 2+ femoral pulses non-palpable pedal pulses bilaterally  Data: Patient is a carotid duplex exam approximately 2 weeks ago which showed no significant recurrent stenosis and less than 40% stenosis bilaterally.  Assessment: Doing well status post right carotid endarterectomy.  Plan: Continue aspirin and statin. Follow-up carotid duplex exam and office visit with me in 1 year  Fabienne Bruns, MD [facility] [place] Office:  [phone] Pager: [phone]

## 2017-05-21 ENCOUNTER — Ambulatory Visit: Payer: 59 | Admitting: Vascular Surgery

## 2017-07-08 NOTE — Addendum Note (Signed)
Addended by: Adis Sturgill A on: 07/08/2017 11:30 AM   Modules accepted: Orders  

## 2017-07-14 DIAGNOSIS — Z0000 Encounter for general adult medical examination without abnormal findings: Secondary | ICD-10-CM | POA: Diagnosis not present

## 2017-07-14 DIAGNOSIS — R7301 Impaired fasting glucose: Secondary | ICD-10-CM | POA: Diagnosis not present

## 2017-07-14 DIAGNOSIS — Z125 Encounter for screening for malignant neoplasm of prostate: Secondary | ICD-10-CM | POA: Diagnosis not present

## 2017-07-14 DIAGNOSIS — E782 Mixed hyperlipidemia: Secondary | ICD-10-CM | POA: Diagnosis not present

## 2017-09-22 ENCOUNTER — Encounter: Payer: Self-pay | Admitting: Cardiology

## 2017-11-26 DIAGNOSIS — L0390 Cellulitis, unspecified: Secondary | ICD-10-CM | POA: Diagnosis not present

## 2017-12-07 ENCOUNTER — Encounter: Payer: Self-pay | Admitting: Podiatry

## 2017-12-07 ENCOUNTER — Ambulatory Visit (INDEPENDENT_AMBULATORY_CARE_PROVIDER_SITE_OTHER): Payer: 59 | Admitting: Podiatry

## 2017-12-07 DIAGNOSIS — L600 Ingrowing nail: Secondary | ICD-10-CM | POA: Diagnosis not present

## 2017-12-07 DIAGNOSIS — L989 Disorder of the skin and subcutaneous tissue, unspecified: Secondary | ICD-10-CM | POA: Diagnosis not present

## 2017-12-07 NOTE — Patient Instructions (Signed)

## 2017-12-08 NOTE — Progress Notes (Signed)
   Subjective: Patient presents today for evaluation of pain to the medial border of the left great toe that began about one month ago. Patient is concerned for possible ingrown nail. Wearing shoes increases the pain. He states he was on an antibiotic for ten days with no significant relief.  He also reports a painful callus lesion to the fifth toe of the right foot. He has not done anything to treat the symptoms. Walking and bearing weight increases the pain. Patient presents today for further treatment and evaluation.   Past Medical History:  Diagnosis Date  . Carotid artery occlusion   . Cellulitis   . Erectile dysfunction   . Hyperlipidemia   . Hypertension     Objective:  General: Well developed, nourished, in no acute distress, alert and oriented x3   Dermatology: Skin is warm, dry and supple bilateral. Medial border of the left great toe appears to be erythematous with evidence of an ingrowing nail. Pain on palpation noted to the border of the nail fold. The remaining nails appear unremarkable at this time. There are no open sores, lesions. Hyperkeratotic lesion present on the right 5th toe. Pain on palpation with a central nucleated core noted.    Vascular: Dorsalis Pedis artery and Posterior Tibial artery pedal pulses palpable. No lower extremity edema noted.   Neruologic: Grossly intact via light touch bilateral.  Musculoskeletal: Muscular strength within normal limits in all groups bilateral. Normal range of motion noted to all pedal and ankle joints.   Assesement: #1 Paronychia with ingrowing nail medial border left great toe #2 Pain in toe #3 Incurvated nail #4 interdigital corn right fifth toe  Plan of Care:  1. Patient evaluated.  2. Discussed treatment alternatives and plan of care. Explained nail avulsion procedure and post procedure course to patient. 3. Patient opted for permanent partial nail avulsion.  4. Prior to procedure, local anesthesia infiltration  utilized using 3 ml of a 50:50 mixture of 2% plain lidocaine and 0.5% plain marcaine in a normal hallux block fashion and a betadine prep performed.  5. Partial permanent nail avulsion with chemical matrixectomy performed using 3x30sec applications of phenol followed by alcohol flush.  6. Light dressing applied. 7. Excisional debridement of keratoic lesion using a chisel blade was performed without incident. Light dressing applied.  8. Return to clinic in 2 weeks.   Going on a trip to [place] in two weeks.   Felecia Shelling, DPM [facility]  Dr. Felecia Shelling, DPM    [address]                                        [place], [place] [address]                Office [phone]  Fax [phone]

## 2017-12-16 ENCOUNTER — Encounter: Payer: Self-pay | Admitting: Podiatry

## 2017-12-16 ENCOUNTER — Ambulatory Visit (INDEPENDENT_AMBULATORY_CARE_PROVIDER_SITE_OTHER): Payer: 59 | Admitting: Podiatry

## 2017-12-16 DIAGNOSIS — L600 Ingrowing nail: Secondary | ICD-10-CM

## 2017-12-19 NOTE — Progress Notes (Signed)
   Subjective: Patient presents today 2 weeks post ingrown nail permanent nail avulsion procedure of the medial border of the left hallux. Patient states that the toe and nail fold is feeling much better. Patient is here for further evaluation and treatment.   Past Medical History:  Diagnosis Date  . Carotid artery occlusion   . Cellulitis   . Erectile dysfunction   . Hyperlipidemia   . Hypertension     Objective: Skin is warm, dry and supple. Nail and respective nail fold appears to be healing appropriately. Open wound to the associated nail fold with a granular wound base and moderate amount of fibrotic tissue. Minimal drainage noted. Mild erythema around the periungual region likely due to phenol chemical matricectomy.  Assessment: #1 postop permanent partial nail avulsion medial border left hallux #2 open wound periungual nail fold of respective digit.   Plan of care: #1 patient was evaluated  #2 debridement of open wound was performed to the periungual border of the respective toe using a currette. Antibiotic ointment and Band-Aid was applied. #3 patient is to return to clinic on a PRN basis.   Felecia Shelling, DPM [facility]  Dr. Felecia Shelling, DPM    [address]                                        [place], [place] [address]                Office [phone]  Fax [phone]

## 2018-03-31 DIAGNOSIS — L249 Irritant contact dermatitis, unspecified cause: Secondary | ICD-10-CM | POA: Diagnosis not present

## 2018-03-31 DIAGNOSIS — B351 Tinea unguium: Secondary | ICD-10-CM | POA: Diagnosis not present

## 2018-05-08 DIAGNOSIS — Z23 Encounter for immunization: Secondary | ICD-10-CM | POA: Diagnosis not present

## 2018-07-01 ENCOUNTER — Telehealth (HOSPITAL_COMMUNITY): Payer: Self-pay | Admitting: *Deleted

## 2018-07-01 ENCOUNTER — Ambulatory Visit (HOSPITAL_COMMUNITY)
Admission: RE | Admit: 2018-07-01 | Discharge: 2018-07-01 | Disposition: A | Discharge status: Home / Self Care | Payer: 59 | Source: Ambulatory Visit | Attending: Vascular Surgery | Admitting: Vascular Surgery

## 2018-07-01 ENCOUNTER — Other Ambulatory Visit: Payer: Self-pay

## 2018-07-01 ENCOUNTER — Encounter: Payer: Self-pay | Admitting: Vascular Surgery

## 2018-07-01 ENCOUNTER — Ambulatory Visit (INDEPENDENT_AMBULATORY_CARE_PROVIDER_SITE_OTHER): Payer: 59 | Admitting: Vascular Surgery

## 2018-07-01 VITALS — BP 159/83 | HR 59 | Temp 97.6°F | Resp 18 | Ht 69.0 in | Wt 213.0 lb

## 2018-07-01 DIAGNOSIS — I6521 Occlusion and stenosis of right carotid artery: Secondary | ICD-10-CM

## 2018-07-01 NOTE — Telephone Encounter (Signed)
error 

## 2018-07-01 NOTE — Progress Notes (Signed)
Patient is a 62 year old male who returns for follow-up today.  He underwent right carotid endarterectomy January 2017.  He denies any symptoms of TIA amaurosis or stroke.  He is on aspirin and a statin.  Review of systems: He denies shortness of breath.  He denies chest pain.  Family History  Problem Relation Age of Onset  . Other Mother        circulatory problems  . Heart attack Mother   . Deep vein thrombosis Mother   . Heart disease Mother   . Hyperlipidemia Father   . Hypertension Father   . Diabetes Father   . Deep vein thrombosis Father   . Heart disease Father        before age 50  . Heart attack Father   . Peripheral vascular disease Father   . Hypertension Brother   . Hyperlipidemia Brother    Social History   Socioeconomic History  . Marital status: Married    Spouse name: Not on file  . Number of children: Not on file  . Years of education: Not on file  . Highest education level: Not on file  Occupational History  . Not on file  Social Needs  . Financial resource strain: Not on file  . Food insecurity:    Worry: Not on file    Inability: Not on file  . Transportation needs:    Medical: Not on file    Non-medical: Not on file  Tobacco Use  . Smoking status: Never Smoker  . Smokeless tobacco: Former Neurosurgeon    Types: Chew  Substance and Sexual Activity  . Alcohol use: Yes    Alcohol/week: 7.0 standard drinks    Types: 7 Cans of beer per week    Comment: pt states that he use to have a beer occasionly but has not had one in about one month  . Drug use: No  . Sexual activity: Not on file  Lifestyle  . Physical activity:    Days per week: Not on file    Minutes per session: Not on file  . Stress: Not on file  Relationships  . Social connections:    Talks on phone: Not on file    Gets together: Not on file    Attends religious service: Not on file    Active member of club or organization: Not on file    Attends meetings of clubs or organizations: Not  on file    Relationship status: Not on file  . Intimate partner violence:    Fear of current or ex partner: Not on file    Emotionally abused: Not on file    Physically abused: Not on file    Forced sexual activity: Not on file  Other Topics Concern  . Not on file  Social History Narrative  . Not on file     Physical exam:  Vitals:   07/01/18 1531 07/01/18 1533 07/01/18 1534  BP: (!) 172/75 (!) 164/78 (!) 159/83  Pulse: (!) 59 (!) 59 (!) 59  Resp: 18    Temp: 97.6 F (36.4 C)    TempSrc: Oral    SpO2: 97%    Weight: 213 lb (96.6 kg)    Height: 5\' 9"  (1.753 m)      Neck: No carotid bruits  Chest: Clear to auscultation bilaterally Cardiac: The rate and rhythm without murmur  Neuro: Symmetric upper extremity lower extremity motor strength 5/5  Data: Patient had a carotid duplex exam showed less than  40% stenosis bilaterally  Current Outpatient Medications on File Prior to Visit  Medication Sig Dispense Refill  . aspirin 81 MG tablet Take 81 mg by mouth daily.    [name] Kitchen. atorvastatin (LIPITOR) 40 MG tablet     . JUBLIA 10 % SOLN APPLY TO THE AFFECTED AREA DAILY  3  . lisinopril (PRINIVIL,ZESTRIL) 10 MG tablet Take 10 mg by mouth daily.     No current facility-administered medications on file prior to visit.    Assessment: Doing well status post carotid endarterectomy no evidence of restenosis  Plan: The patient will follow-up carotid duplex exam in 1 year.  He will continue his aspirin and statin.  He will return sooner if he develops any symptoms of TIA amaurosis or stroke.  Fabienne Brunsharles Verla Bryngelson, MD [facility] Office: [phone] Pager: [phone]

## 2018-07-28 DIAGNOSIS — Z0000 Encounter for general adult medical examination without abnormal findings: Secondary | ICD-10-CM | POA: Diagnosis not present

## 2018-07-28 DIAGNOSIS — I10 Essential (primary) hypertension: Secondary | ICD-10-CM | POA: Diagnosis not present

## 2018-09-01 ENCOUNTER — Ambulatory Visit: Payer: 59 | Admitting: Podiatry

## 2019-02-03 DIAGNOSIS — Z86010 Personal history of colonic polyps: Secondary | ICD-10-CM | POA: Insufficient documentation

## 2019-02-03 DIAGNOSIS — K648 Other hemorrhoids: Secondary | ICD-10-CM | POA: Insufficient documentation

## 2019-07-20 ENCOUNTER — Telehealth (HOSPITAL_COMMUNITY): Payer: Self-pay

## 2019-07-20 NOTE — Telephone Encounter (Signed)

## 2019-07-21 ENCOUNTER — Ambulatory Visit (INDEPENDENT_AMBULATORY_CARE_PROVIDER_SITE_OTHER): Payer: 59 | Admitting: Vascular Surgery

## 2019-07-21 ENCOUNTER — Ambulatory Visit (HOSPITAL_COMMUNITY)
Admission: RE | Admit: 2019-07-21 | Discharge: 2019-07-21 | Disposition: A | Discharge status: Home / Self Care | Payer: 59 | Source: Ambulatory Visit | Attending: Family | Admitting: Family

## 2019-07-21 ENCOUNTER — Other Ambulatory Visit: Payer: Self-pay

## 2019-07-21 ENCOUNTER — Encounter: Payer: Self-pay | Admitting: Vascular Surgery

## 2019-07-21 VITALS — BP 154/83 | HR 68 | Temp 97.9°F | Resp 14 | Ht 70.0 in | Wt 216.5 lb

## 2019-07-21 DIAGNOSIS — I6521 Occlusion and stenosis of right carotid artery: Secondary | ICD-10-CM | POA: Diagnosis not present

## 2019-07-21 DIAGNOSIS — M7989 Other specified soft tissue disorders: Secondary | ICD-10-CM | POA: Diagnosis not present

## 2019-07-21 NOTE — Progress Notes (Signed)
Patient is a 63 year old male who returns for follow-up today.  He underwent right carotid endarterectomy January 2017.  He remains on aspirin and a statin.  He has had no symptoms of TIA amaurosis or stroke.  He has been noticing that his legs are more swollen recently.  He states that his leg swelling is worse after he is sitting at his desk all day long.  He does not get much leg swelling on the weekends when he is up around and active.  He has not worn any compression stockings in the past.  Social History   Socioeconomic History  . Marital status: Married    Spouse name: Not on file  . Number of children: Not on file  . Years of education: Not on file  . Highest education level: Not on file  Occupational History  . Not on file  Tobacco Use  . Smoking status: Never Smoker  . Smokeless tobacco: Former Neurosurgeon    Types: Chew  Substance and Sexual Activity  . Alcohol use: Yes    Alcohol/week: 7.0 standard drinks    Types: 7 Cans of beer per week    Comment: pt states that he use to have a beer occasionly but has not had one in about one month  . Drug use: No  . Sexual activity: Not on file  Other Topics Concern  . Not on file  Social History Narrative  . Not on file   Social Determinants of Health   Financial Resource Strain:   . Difficulty of Paying Living Expenses: Not on file  Food Insecurity:   . Worried About Programme researcher, broadcasting/film/video in the Last Year: Not on file  . Ran Out of Food in the Last Year: Not on file  Transportation Needs:   . Lack of Transportation (Medical): Not on file  . Lack of Transportation (Non-Medical): Not on file  Physical Activity:   . Days of Exercise per Week: Not on file  . Minutes of Exercise per Session: Not on file  Stress:   . Feeling of Stress : Not on file  Social Connections:   . Frequency of Communication with Friends and Family: Not on file  . Frequency of Social Gatherings with Friends and Family: Not on file  . Attends Religious  Services: Not on file  . Active Member of Clubs or Organizations: Not on file  . Attends Banker Meetings: Not on file  . Marital Status: Not on file  Intimate Partner Violence:   . Fear of Current or Ex-Partner: Not on file  . Emotionally Abused: Not on file  . Physically Abused: Not on file  . Sexually Abused: Not on file    Past Medical History:  Diagnosis Date  . Carotid artery occlusion   . Cellulitis   . Erectile dysfunction   . Hyperlipidemia   . Hypertension     Past Surgical History:  Procedure Laterality Date  . COLONOSCOPY    . ENDARTERECTOMY Right 09/05/2015   Procedure: ENDARTERECTOMY CAROTID RIGHT;  Surgeon: Sherren Kerns, MD;  Location: [facility];  Service: Vascular;  Laterality: Right;  . PATCH ANGIOPLASTY  09/05/2015   Procedure: PATCH ANGIOPLASTY RIGHT CAROTID ARTERY USING HEMASHIELD PLATINUM FINESSE PATCH;  Surgeon: Sherren Kerns, MD;  Location: [facility] OR;  Service: Vascular;;  . TONSILLECTOMY    . VASECTOMY      Review of systems: He has no shortness of breath.  He has no chest pain.  Data: Patient  had a carotid duplex exam today which I reviewed and interpreted.  This showed no significant carotid stenosis may be some mild right subclavian artery stenosis.  Physical exam:  Vitals:   07/21/19 1233  BP: (!) 154/83  Pulse: 68  Resp: 14  Temp: 97.9 F (36.6 C)  TempSrc: Temporal  SpO2: 97%  Weight: 216 lb 8 oz (98.2 kg)  Height: 5\' 10"  (1.778 m)    Neck: 2+ carotid pulses  Extremities: 1+ pretibial edema both lower extremities  Neuro: Symmetric upper extremity and lower extremity motor strength 5/5 no facial asymmetry  Assessment:  1.  Carotid occlusive disease no significant recurrent stenosis continue aspirin statin  2.  Lower extremity leg swelling primarily with dependency.  Discussed with patient wearing lower extremity compression stockings for symptomatic relief.  If he is not satisfied by relief of symptoms with his  compression stockings we can further evaluate this in 3 months with a venous reflux study to see if he may have a venous etiology for his leg swelling.  He will also practice leg elevation at the end of the day to reduce swelling.  Ruta Hinds, MD [facility] Office: [phone]

## 2019-08-09 ENCOUNTER — Other Ambulatory Visit: Payer: Self-pay | Admitting: *Deleted

## 2019-08-09 DIAGNOSIS — I6521 Occlusion and stenosis of right carotid artery: Secondary | ICD-10-CM

## 2019-11-21 ENCOUNTER — Other Ambulatory Visit: Payer: Self-pay | Admitting: Podiatry

## 2019-11-21 ENCOUNTER — Ambulatory Visit (INDEPENDENT_AMBULATORY_CARE_PROVIDER_SITE_OTHER): Payer: 59

## 2019-11-21 ENCOUNTER — Ambulatory Visit (INDEPENDENT_AMBULATORY_CARE_PROVIDER_SITE_OTHER): Payer: 59 | Admitting: Podiatry

## 2019-11-21 ENCOUNTER — Other Ambulatory Visit: Payer: Self-pay

## 2019-11-21 DIAGNOSIS — M722 Plantar fascial fibromatosis: Secondary | ICD-10-CM | POA: Diagnosis not present

## 2019-11-21 DIAGNOSIS — L600 Ingrowing nail: Secondary | ICD-10-CM | POA: Diagnosis not present

## 2019-11-21 DIAGNOSIS — M79671 Pain in right foot: Secondary | ICD-10-CM

## 2019-11-21 DIAGNOSIS — M79672 Pain in left foot: Secondary | ICD-10-CM

## 2019-11-21 MED ORDER — MELOXICAM 15 MG PO TABS: 15.0000 mg | ORAL_TABLET | Freq: Every day | ORAL | 1 refills | Status: DC

## 2019-11-21 MED ORDER — GENTAMICIN SULFATE 0.1 % EX CREA: 1.0000 "application " | TOPICAL_CREAM | Freq: Two times a day (BID) | CUTANEOUS | 1 refills | Status: DC

## 2019-11-27 NOTE — Progress Notes (Signed)
   Subjective: Patient presents today for evaluation of pain to the lateral border of the left 2nd toe that began 2-3 weeks ago. Patient is concerned for possible ingrown nail. He also reports pain to the bilateral plantar heels. Being on the feet or working in his yard increases the pain. He has not had any treatment for the symptoms. Patient presents today for further treatment and evaluation.  Past Medical History:  Diagnosis Date  . Carotid artery occlusion   . Cellulitis   . Erectile dysfunction   . Hyperlipidemia   . Hypertension     Objective:  General: Well developed, nourished, in no acute distress, alert and oriented x3   Dermatology: Skin is warm, dry and supple bilateral. Lateral border of the left 2nd toe appears to be erythematous with evidence of an ingrowing nail. Pain on palpation noted to the border of the nail fold. The remaining nails appear unremarkable at this time. There are no open sores, lesions.  Vascular: Dorsalis Pedis artery and Posterior Tibial artery pedal pulses palpable. No lower extremity edema noted.   Neruologic: Grossly intact via light touch bilateral.  Musculoskeletal: Pain with palpation noted to the bilateral heels along the plantar fascia. Muscular strength within normal limits in all groups bilateral. Normal range of motion noted to all pedal and ankle joints.   Assesement: #1 Paronychia with ingrowing nail lateral border left  #2 Pain in toe #3 Incurvated nail #4 Plantar fasciitis bilateral, right worse than left   Plan of Care:  1. Patient evaluated.  2. Discussed treatment alternatives and plan of care. Explained nail avulsion procedure and post procedure course to patient. 3. Patient opted for permanent partial nail avulsion of the lateral border left 2nd toe.  4. Prior to procedure, local anesthesia infiltration utilized using 3 ml of a 50:50 mixture of 2% plain lidocaine and 0.5% plain marcaine in a normal digital block fashion and a  betadine prep performed.  5. Partial permanent nail avulsion with chemical matrixectomy performed using 3x30sec applications of phenol followed by alcohol flush.  6. Light dressing applied. 7. Injection of 0.5 mLs Celestone Soluspan injected into the bilateral heels.  8. Plantar fascial braces dispensed bilaterally.  9. Prescription for Meloxicam provided to patient. 10. Appointment with Pedorthist for custom molded orthotics.  11. Return to clinic as needed.  Felecia Shelling, DPM [facility]  Dr. Felecia Shelling, DPM    [address]                                        [place], [place] [address]                Office [phone]  Fax [phone]

## 2019-12-05 ENCOUNTER — Ambulatory Visit (INDEPENDENT_AMBULATORY_CARE_PROVIDER_SITE_OTHER): Payer: 59 | Admitting: Orthotics

## 2019-12-05 ENCOUNTER — Other Ambulatory Visit: Payer: Self-pay

## 2019-12-05 DIAGNOSIS — M722 Plantar fascial fibromatosis: Secondary | ICD-10-CM

## 2019-12-05 NOTE — Progress Notes (Signed)

## 2019-12-26 ENCOUNTER — Other Ambulatory Visit: Payer: Self-pay

## 2019-12-26 ENCOUNTER — Ambulatory Visit: Payer: 59 | Admitting: Orthotics

## 2019-12-26 DIAGNOSIS — M722 Plantar fascial fibromatosis: Secondary | ICD-10-CM

## 2019-12-26 NOTE — Progress Notes (Signed)
Patient came in today to pick up custom made foot orthotics.  The goals were accomplished and the patient reported no dissatisfaction with said orthotics.  Patient was advised of breakin period and how to report any issues. 

## 2020-01-02 ENCOUNTER — Telehealth: Payer: Self-pay | Admitting: Podiatry

## 2020-01-02 NOTE — Telephone Encounter (Signed)
Pt called and stated that he has tried to use the new orthotics and he is still having pain. He wants to know how long it would take for the orthotics to to start working. He woould like for Dr. Logan Bores to give him a call.

## 2020-01-03 NOTE — Telephone Encounter (Signed)
Left message informing pt that if he was still having trouble with wearing the orthotics after 1 month, he should make an appt with Dr. Logan Bores and bring the orthotics with a typical type of shoe he wears them with, Dr. Logan Bores can evaluation for the necessity of modification and if the pedorthist is in-office get that started.

## 2020-05-14 ENCOUNTER — Ambulatory Visit: Payer: 59 | Admitting: Orthotics

## 2020-05-14 ENCOUNTER — Other Ambulatory Visit: Payer: Self-pay

## 2020-05-14 ENCOUNTER — Ambulatory Visit (INDEPENDENT_AMBULATORY_CARE_PROVIDER_SITE_OTHER): Payer: 59 | Admitting: Podiatry

## 2020-05-14 DIAGNOSIS — Q828 Other specified congenital malformations of skin: Secondary | ICD-10-CM

## 2020-05-14 DIAGNOSIS — M21622 Bunionette of left foot: Secondary | ICD-10-CM

## 2020-05-14 DIAGNOSIS — M722 Plantar fascial fibromatosis: Secondary | ICD-10-CM | POA: Diagnosis not present

## 2020-05-14 DIAGNOSIS — L600 Ingrowing nail: Secondary | ICD-10-CM

## 2020-05-14 NOTE — Progress Notes (Signed)
Add heel cushioning rt; offload 5th left.

## 2020-05-27 NOTE — Progress Notes (Signed)
   HPI: 64 y.o. male presenting today for evaluation of multiple complaints to the bilateral feet.  Patient states that he has been suffering from right heel pain for several months now.  Gradual onset.  His feet become very sore from working in the yard.  Currently he wears custom molded orthotics. Patient also complains of a symptomatic callus lesion noted to the right foot as well.  This is on the medial aspect of the fifth toe of the right foot.  It is sore with close toed shoes.  This has been going on for now several months. Finally the patient states that he has developed a bump to the outside of the left foot.  It is very sore and aggravating with shoe gear.  He presents for further treatment evaluation  Past Medical History:  Diagnosis Date  . Carotid artery occlusion   . Cellulitis   . Erectile dysfunction   . Hyperlipidemia   . Hypertension      Physical Exam: General: The patient is alert and oriented x3 in no acute distress.  Dermatology: Skin is warm, dry and supple bilateral lower extremities. Negative for open lesions or macerations.  Symptomatic hyperkeratotic callus tissue noted to the medial aspect of the right fifth toe.  Vascular: Palpable pedal pulses bilaterally. No edema or erythema noted. Capillary refill within normal limits.  Neurological: Epicritic and protective threshold grossly intact bilaterally.   Musculoskeletal Exam: Range of motion within normal limits to all pedal and ankle joints bilateral. Muscle strength 5/5 in all groups bilateral.  Pain on palpation noted to the medial calcaneal tubercle right plantar fascia.  There is also clinical evidence of a tailor's bunionette deformity to the left foot with a hyper trophic head of the fifth metatarsal.  Assessment: 1.  Chronic plantar fasciitis right. 2.  Tailor's bunionette deformity left. 3.  Porokeratosis medial border right fifth toe.   Plan of Care:  1. Patient evaluated.  2.  Today we discussed  both conservative and surgical options for the patient regarding chronic plantar fasciitis and tailor's bunionectomy surgery.  All patient questions were answered.  Patient states that he will think about surgery. 3.  In the meantime continue custom molded orthotics 4.  Return to clinic as needed if you would like to pursue surgery  *71-week-old grandbaby just recently passed away      Felecia Shelling, DPM [facility]  Dr. Felecia Shelling, DPM    [address] [place], [place] [address]                Office [phone]  Fax [phone]

## 2020-05-29 ENCOUNTER — Telehealth: Payer: Self-pay | Admitting: Podiatry

## 2020-05-29 NOTE — Telephone Encounter (Signed)
Patient is considering having the surgery. But he would like to speak with you. Clinton [name] would like to know what the percentage is of the surgery correcting the problem, before saying ok.[name] Kitchen

## 2020-05-31 ENCOUNTER — Telehealth: Payer: Self-pay

## 2020-05-31 NOTE — Telephone Encounter (Signed)
Patient's wife called, asking [place] to call the patient back to start the process for surgery. He has decided to proceed. Please advise Thanks

## 2020-06-18 ENCOUNTER — Ambulatory Visit (INDEPENDENT_AMBULATORY_CARE_PROVIDER_SITE_OTHER): Payer: 59 | Admitting: Podiatry

## 2020-06-18 ENCOUNTER — Other Ambulatory Visit: Payer: Self-pay

## 2020-06-18 DIAGNOSIS — M21622 Bunionette of left foot: Secondary | ICD-10-CM

## 2020-06-18 DIAGNOSIS — M722 Plantar fascial fibromatosis: Secondary | ICD-10-CM | POA: Diagnosis not present

## 2020-06-18 NOTE — Progress Notes (Signed)
   HPI: 64 y.o. male presenting today for follow-up evaluation of a tailor's bunion deformity to the left foot as well as chronic plantar fasciitis to the right.  Patient states that he would like to proceed with surgery at this time.  He presents for further treatment and evaluation  Past Medical History:  Diagnosis Date  . Carotid artery occlusion   . Cellulitis   . Erectile dysfunction   . Hyperlipidemia   . Hypertension      Physical Exam: General: The patient is alert and oriented x3 in no acute distress.  Dermatology: Skin is warm, dry and supple bilateral lower extremities. Negative for open lesions or macerations.  Symptomatic hyperkeratotic callus tissue noted to the medial aspect of the right fifth toe.  Vascular: Palpable pedal pulses bilaterally. No edema or erythema noted. Capillary refill within normal limits.  Neurological: Epicritic and protective threshold grossly intact bilaterally.   Musculoskeletal Exam: Range of motion within normal limits to all pedal and ankle joints bilateral. Muscle strength 5/5 in all groups bilateral.  Pain on palpation noted to the medial calcaneal tubercle right plantar fascia.  There is also clinical evidence of a tailor's bunionette deformity to the left foot with a hyper trophic head of the fifth metatarsal.  Assessment: 1.  Chronic plantar fasciitis right. 2.  Tailor's bunionette deformity left. 3.  Porokeratosis medial border right fifth toe.   Plan of Care:  1. Patient evaluated.  2. Today we discussed the conservative versus surgical management of the presenting pathology. The patient opts for surgical management. All possible complications and details of the procedure were explained. All patient questions were answered. No guarantees were expressed or implied. 3. Authorization for surgery was initiated today. Surgery will consist of endoscopic plantar fasciotomy right, tailor's bunionectomy with osteotomy left.  4.  Return to  clinic 1 week postop  *59-week-old grandbaby passed away beginning of 2023/05/29.      Felecia Shelling, DPM [facility]  Dr. Felecia Shelling, DPM    [address] [place], [place] [address]                Office [phone]  Fax [phone]

## 2020-07-12 ENCOUNTER — Telehealth: Payer: Self-pay | Admitting: Podiatry

## 2020-07-12 NOTE — Telephone Encounter (Addendum)
DOS: 07/26/2020  Procedures: Metatarsal Osteotomy 5th Lt [phone]) and Endoscopic Plantar  Fasciotomy Rt (01027)  [employer] Effective From 08/12/2019 - 08/10/2020  Deductible: $2,536 with $6,440.34 met and $1,164.73 remaining. Out of Pocket: $4,000 with $1,635.27 met and $2,364.73 remaining. CoInsurance: 10% Copay: $0  Prior Authorization Number [id number] Valid From 07/26/2020 - 10/24/2020.

## 2020-07-19 ENCOUNTER — Telehealth: Payer: Self-pay

## 2020-07-19 NOTE — Telephone Encounter (Signed)
DOS 07/26/2020  METATARSAL OSTEOTOMY LT - 28308 EPF RT - 17408  [employer] EFFECTIVE DATE - 08/12/2019  PLAN DEDUCTIBLE - $2800.00 W/ $1448.18 REMAINING OUT OF POCKET - $4000.00 W/ $5631.49 REMAINING COPAY $0.00 COINSURANCE - 10%   NOTIFICATION/PRIOR AUTHORIZATION NUMBER CASE STATUS CASE STATUS REASON PRIMARY CARE PHYSICIAN [id number] Closed Case Was Managed And Is Now Complete - ADVANCE NOTIFY DATE/TIME ADMISSION NOTIFY DATE/TIME 07/18/2020 01:56 PM CST - COVERAGE STATUS OVERALL COVERAGE STATUS Covered/Approved 1-2 CODE DESCRIPTION COVERAGE STATUS DECISION DATE [facility] Coverage determination is reflected for the facility admission and is not a guarantee of payment for ongoing services. Covered/Approved 07/18/2020 1 [id number] Osteotomy, with or without lengthening, more Covered/Approved 07/18/2020 2 [id number] Endoscopic plantar fasciotomy Covered/Approved 07/18/2020

## 2020-07-24 ENCOUNTER — Telehealth (HOSPITAL_COMMUNITY): Payer: Self-pay

## 2020-07-24 NOTE — Telephone Encounter (Signed)
07/24/20  Received incoming fax requesting STAT records of carotids to [facility]. Most current Carotid was faxed over to [facility] @ [phone].  [employer]

## 2020-07-26 ENCOUNTER — Other Ambulatory Visit: Payer: Self-pay | Admitting: Podiatry

## 2020-07-26 DIAGNOSIS — M722 Plantar fascial fibromatosis: Secondary | ICD-10-CM

## 2020-07-26 DIAGNOSIS — M21542 Acquired clubfoot, left foot: Secondary | ICD-10-CM

## 2020-07-26 MED ORDER — IBUPROFEN 800 MG PO TABS: 800.0000 mg | ORAL_TABLET | Freq: Three times a day (TID) | ORAL | 1 refills | Status: DC

## 2020-07-26 MED ORDER — OXYCODONE-ACETAMINOPHEN 5-325 MG PO TABS: 1.0000 | ORAL_TABLET | ORAL | 0 refills | Status: DC | PRN

## 2020-07-26 NOTE — Progress Notes (Signed)
PRN postop 

## 2020-08-01 ENCOUNTER — Ambulatory Visit (INDEPENDENT_AMBULATORY_CARE_PROVIDER_SITE_OTHER): Payer: 59 | Admitting: Podiatry

## 2020-08-01 ENCOUNTER — Ambulatory Visit (INDEPENDENT_AMBULATORY_CARE_PROVIDER_SITE_OTHER): Payer: 59

## 2020-08-01 ENCOUNTER — Other Ambulatory Visit: Payer: Self-pay

## 2020-08-01 DIAGNOSIS — Z9889 Other specified postprocedural states: Secondary | ICD-10-CM

## 2020-08-01 DIAGNOSIS — M79672 Pain in left foot: Secondary | ICD-10-CM | POA: Diagnosis not present

## 2020-08-01 DIAGNOSIS — M79671 Pain in right foot: Secondary | ICD-10-CM

## 2020-08-01 NOTE — Progress Notes (Signed)
   Subjective:  Patient presents today status post tailor's bunionectomy left, EPF right.. DOS: 07/26/2020.  Patient states that he is doing well.  He has been wearing regular shoes to drive and his right foot.  No new complaints at this time.  Past Medical History:  Diagnosis Date  . Carotid artery occlusion   . Cellulitis   . Erectile dysfunction   . Hyperlipidemia   . Hypertension       Objective/Physical Exam Neurovascular status intact.  Skin incisions appear to be well coapted with sutures intact. No sign of infectious process noted. No dehiscence. No active bleeding noted. Moderate edema noted to the surgical extremity.  Radiographic Exam:  Orthopedic hardware and osteotomies sites appear to be stable with routine healing.  Assessment: 1. s/p tailor's bunionectomy left.  EPF right.. DOS: 07/26/2020   Plan of Care:  1. Patient was evaluated. X-rays reviewed 2.  Dressings changed today. 3.  Recommend Ace wrap daily 4.  Continue minimal weightbearing in the postsurgical shoes 5.  Return to clinic 1 week for suture removal   Felecia Shelling, DPM [facility]  Dr. Felecia Shelling, DPM    [address] [place], [place] [address]                Office [phone]  Fax [phone]

## 2020-08-02 ENCOUNTER — Other Ambulatory Visit: Payer: Self-pay

## 2020-08-02 ENCOUNTER — Encounter (HOSPITAL_COMMUNITY): Payer: Self-pay

## 2020-08-02 ENCOUNTER — Emergency Department (HOSPITAL_COMMUNITY)
Admission: EM | Admit: 2020-08-02 | Discharge: 2020-08-02 | Disposition: A | Discharge status: Home / Self Care | Payer: 59 | Attending: Emergency Medicine | Admitting: Emergency Medicine

## 2020-08-02 ENCOUNTER — Telehealth: Payer: Self-pay | Admitting: Podiatry

## 2020-08-02 ENCOUNTER — Emergency Department (HOSPITAL_BASED_OUTPATIENT_CLINIC_OR_DEPARTMENT_OTHER): Payer: 59

## 2020-08-02 DIAGNOSIS — M7989 Other specified soft tissue disorders: Secondary | ICD-10-CM

## 2020-08-02 DIAGNOSIS — R2242 Localized swelling, mass and lump, left lower limb: Secondary | ICD-10-CM | POA: Diagnosis not present

## 2020-08-02 DIAGNOSIS — I10 Essential (primary) hypertension: Secondary | ICD-10-CM | POA: Insufficient documentation

## 2020-08-02 NOTE — ED Provider Notes (Signed)
Emergency Department Provider Note   I have reviewed the triage vital signs and the nursing notes.   HISTORY  Chief Complaint Leg Swelling   HPI Clinton [name] is a 64 y.o. male with past medical history reviewed below including bilateral lower extremity orthopedic surgery 1 week ago presents with painless left leg swelling since last night.  Patient states that he initially had swelling after the surgery and that he had surgery on both feet but that the left was more extensive requiring multiple screws.  He has been keeping his legs elevated and pain improved.  He did return to work but noted continued swelling just in the left lower extremity.  He called his orthopedic surgery office and they referred him to the ED to rule out DVT.  Is not having chest pain or shortness of breath.  No leg redness, bruising, other skin changes.  He is ambulatory without difficulty. No radiation of symptoms or modifying factors.   Past Medical History:  Diagnosis Date  . Carotid artery occlusion   . Cellulitis   . Erectile dysfunction   . Hyperlipidemia   . Hypertension     Patient Active Problem List   Diagnosis Date Noted  . Carotid artery stenosis, asymptomatic 09/05/2015  . Cellulitis   . Hypertension   . Hyperlipidemia   . Occlusion and stenosis of carotid artery without mention of cerebral infarction 01/01/2012    Past Surgical History:  Procedure Laterality Date  . COLONOSCOPY    . ENDARTERECTOMY Right 09/05/2015   Procedure: ENDARTERECTOMY CAROTID RIGHT;  Surgeon: Sherren Kerns, MD;  Location: [facility] OR;  Service: Vascular;  Laterality: Right;  . PATCH ANGIOPLASTY  09/05/2015   Procedure: PATCH ANGIOPLASTY RIGHT CAROTID ARTERY USING HEMASHIELD PLATINUM FINESSE PATCH;  Surgeon: Sherren Kerns, MD;  Location: [facility] OR;  Service: Vascular;;  . TONSILLECTOMY    . VASECTOMY      Allergies Penicillins  Family History  Problem Relation Age of Onset  . Other Mother         circulatory problems  . Heart attack Mother   . Deep vein thrombosis Mother   . Heart disease Mother   . Hyperlipidemia Father   . Hypertension Father   . Diabetes Father   . Deep vein thrombosis Father   . Heart disease Father        before age 60  . Heart attack Father   . Peripheral vascular disease Father   . Hypertension Brother   . Hyperlipidemia Brother     Social History Social History   Tobacco Use  . Smoking status: Never Smoker  . Smokeless tobacco: Former Neurosurgeon    Types: Chew  Substance Use Topics  . Alcohol use: Yes    Alcohol/week: 7.0 standard drinks    Types: 7 Cans of beer per week    Comment: pt states that he use to have a beer occasionly but has not had one in about one month  . Drug use: No    Review of Systems  Constitutional: No fever/chills Eyes: No visual changes. ENT: No sore throat. Cardiovascular: Denies chest pain. Respiratory: Denies shortness of breath. Gastrointestinal: No abdominal pain.  No nausea, no vomiting.  No diarrhea.  No constipation. Genitourinary: Negative for dysuria. Musculoskeletal: Negative for back pain. Positive bilateral foot pain. Left leg swelling w/o pain.  Skin: Negative for rash. Neurological: Negative for headaches, focal weakness or numbness.  10-point ROS otherwise negative.  ____________________________________________   PHYSICAL EXAM:  VITAL SIGNS: ED Triage Vitals  Enc Vitals Group     BP 08/02/20 1434 (!) 165/125     Pulse Rate 08/02/20 1434 90     Resp 08/02/20 1434 18     Temp 08/02/20 1434 98.2 F (36.8 C)     Temp Source 08/02/20 1434 Oral     SpO2 08/02/20 1434 100 %     Weight 08/02/20 1434 205 lb (93 kg)     Height 08/02/20 1434 5\' 9"  (1.753 m)   Constitutional: Alert and oriented. Well appearing and in no acute distress. Eyes: Conjunctivae are normal.  Head: Atraumatic. Nose: No congestion/rhinnorhea. Mouth/Throat: Mucous membranes are moist.   Neck: No stridor.  Cardiovascular:  Normal rate, regular rhythm. Good peripheral circulation. Grossly normal heart sounds.   Respiratory: Normal respiratory effort.  No retractions. Lungs CTAB. Gastrointestinal: No distention.  Musculoskeletal: Asymmetric swelling to the left lower extremity mainly involving the calf.  No tenderness to palpation.  No bruising, redness, warmth. Neurologic:  Normal speech and language.  Skin:  Skin is warm, dry and intact. No rash noted.  ____________________________________________  RADIOLOGY  VAS LOWER EXTREMITY VENOUS (DVT) (ONLY [facility] & [facility])  Result Date: 08/02/2020  Lower Venous DVT Study Indications: Swelling, and Foot surgery 1 week ago.  Limitations: Edema. Comparison Study: No prior study Performing Technologist: 08/04/2020 RVS  Examination Guidelines: A complete evaluation includes B-mode imaging, spectral Doppler, color Doppler, and power Doppler as needed of all accessible portions of each vessel. Bilateral testing is considered an integral part of a complete examination. Limited examinations for reoccurring indications may be performed as noted. The reflux portion of the exam is performed with the patient in reverse Trendelenburg.  +---------+---------------+---------+-----------+----------+-------------------+ RIGHT    CompressibilityPhasicitySpontaneityPropertiesThrombus Aging      +---------+---------------+---------+-----------+----------+-------------------+ CFV      Full           Yes      Yes                                      +---------+---------------+---------+-----------+----------+-------------------+ SFJ      Full                                                             +---------+---------------+---------+-----------+----------+-------------------+ FV Prox  Full                                                             +---------+---------------+---------+-----------+----------+-------------------+ FV Mid   Full                                                              +---------+---------------+---------+-----------+----------+-------------------+ FV DistalFull                                                             +---------+---------------+---------+-----------+----------+-------------------+  PFV      Full                                                             +---------+---------------+---------+-----------+----------+-------------------+ POP      Full           Yes      Yes                                      +---------+---------------+---------+-----------+----------+-------------------+ PTV      Full                                         not all segments                                                          well visualized     +---------+---------------+---------+-----------+----------+-------------------+ PERO     Full                                         not all segments                                                          well visualized     +---------+---------------+---------+-----------+----------+-------------------+   +---------+---------------+---------+-----------+----------+-------------------+ LEFT     CompressibilityPhasicitySpontaneityPropertiesThrombus Aging      +---------+---------------+---------+-----------+----------+-------------------+ CFV      Full           Yes      Yes                                      +---------+---------------+---------+-----------+----------+-------------------+ SFJ      Full                                                             +---------+---------------+---------+-----------+----------+-------------------+ FV Prox  Full                                                             +---------+---------------+---------+-----------+----------+-------------------+ FV Mid   Full                                                              +---------+---------------+---------+-----------+----------+-------------------+  FV DistalFull                                                             +---------+---------------+---------+-----------+----------+-------------------+ PFV      Full                                                             +---------+---------------+---------+-----------+----------+-------------------+ POP      Full           Yes      Yes                                      +---------+---------------+---------+-----------+----------+-------------------+ PTV      Full                                         not all segments                                                          well visualized     +---------+---------------+---------+-----------+----------+-------------------+ PERO     Full                                         not all segments                                                          well visualized     +---------+---------------+---------+-----------+----------+-------------------+   Summary: RIGHT: - There is no evidence of deep vein thrombosis in the lower extremity. However, portions of this examination were limited- see technologist comments above.  interstitial edema noted throughout calf  LEFT: - There is no evidence of deep vein thrombosis in the lower extremity. However, portions of this examination were limited- see technologist comments above.  Interstitial edema noted throughout calf.  *See table(s) above for measurements and observations.    Preliminary     ____________________________________________   PROCEDURES  Procedure(s) performed:   Procedures  None  ____________________________________________   INITIAL IMPRESSION / ASSESSMENT AND PLAN / ED COURSE  Pertinent labs & imaging results that were available during my care of the patient were reviewed by me and considered in my medical decision making (see chart for details).    Patient presents to the emergency department with leg swelling starting last night.  Has had surgery in the past week.  No chest pain or shortness of breath to suspect PE.  Triage note describes bilateral leg swelling but patient  actually describes isolated left lower extremity swelling which is what I appreciate on exam.  DVT is elevated on my differential and will send for lower extremity ultrasound.  No evidence of PE.  Doubt underlying fracture or other traumatic injury.  04:54 PM  No evidence of DVT on ultrasound. Plan for elevation/compression at home with Ortho and PCP follow-up. Discussed ED return precautions specifically with developing leg pain, chest pain, shortness of breath ____________________________________________  FINAL CLINICAL IMPRESSION(S) / ED DIAGNOSES  Final diagnoses:  Leg swelling    Note:  This document was prepared using Dragon voice recognition software and may include unintentional dictation errors.  Alona Bene, MD, [facility]    Shalom Mcguiness, Arlyss Repress, MD 08/02/20 [phone]

## 2020-08-02 NOTE — Progress Notes (Signed)
VASCULAR LAB    Bilateral lower extremity venous duplex has been performed.  See CV proc for preliminary results.  Messaged results to  Dr. Jacqulyn Bath via [place], [facility], RVT 08/02/2020, 4:42 PM

## 2020-08-02 NOTE — Telephone Encounter (Signed)
I called Mr. Adderly wife back after I spoke to Dr. Allena Katz and Dr. Logan Bores. Dr. Allena Katz and Dr. Logan Bores  advised me to tell the patient to go to the ER for a possible DVT. I also let them know to call the office back with any other concerns they may have.

## 2020-08-02 NOTE — Telephone Encounter (Signed)
Pt is requesting to speak with you in regards to his surgical foot. He also states his calf and legs are swollen and thinks it may be connected to the medication he's taking. Please advise.

## 2020-08-02 NOTE — ED Triage Notes (Signed)
Pt reports bilateral leg swelling since last night, pt had recent surgery on his feet, saw [facility], surgeon states everything was healing well. Called their office this morning and they recommended he come here to rule out a blood clot. Pt ambulatory.

## 2020-08-02 NOTE — Discharge Instructions (Signed)
You were seen in the emergency department today with leg swelling. Your ultrasound today did not show a blood clot in the leg. Please keep the leg elevated at home and use compression stockings or wraps as needed. Follow closely with your orthopedic surgery team as well as your PCP. Return to the emergency department with any worsening swelling, pain, chest pain, shortness of breath.

## 2020-08-02 NOTE — ED Notes (Signed)
Patient transported to vascular. 

## 2020-08-09 ENCOUNTER — Other Ambulatory Visit: Payer: Self-pay

## 2020-08-09 ENCOUNTER — Encounter: Payer: Self-pay | Admitting: Vascular Surgery

## 2020-08-09 ENCOUNTER — Ambulatory Visit (HOSPITAL_COMMUNITY)
Admission: RE | Admit: 2020-08-09 | Discharge: 2020-08-09 | Disposition: A | Discharge status: Home / Self Care | Payer: 59 | Source: Ambulatory Visit | Attending: Vascular Surgery | Admitting: Vascular Surgery

## 2020-08-09 ENCOUNTER — Ambulatory Visit (INDEPENDENT_AMBULATORY_CARE_PROVIDER_SITE_OTHER): Payer: 59 | Admitting: Vascular Surgery

## 2020-08-09 VITALS — BP 188/84 | HR 60 | Temp 98.3°F | Resp 20 | Ht 69.0 in | Wt 223.0 lb

## 2020-08-09 DIAGNOSIS — I6521 Occlusion and stenosis of right carotid artery: Secondary | ICD-10-CM

## 2020-08-09 NOTE — Progress Notes (Signed)
Patient is a 64 year old male who returns for follow-up today.  He previously underwent right carotid endarterectomy January 2017.  He remains on aspirin and a statin.  He has had no symptoms of TIA amaurosis or stroke.  He also has chronic lower extremity edema.  This has been worse the last few weeks after having bilateral foot surgery.  He has been wearing compression stockings in the past but has not worn them recently due to sutures in his feet.  Past Medical History:  Diagnosis Date  . Carotid artery occlusion   . Cellulitis   . Erectile dysfunction   . Hyperlipidemia   . Hypertension     Past Surgical History:  Procedure Laterality Date  . COLONOSCOPY    . ENDARTERECTOMY Right 09/05/2015   Procedure: ENDARTERECTOMY CAROTID RIGHT;  Surgeon: Sherren Kerns, MD;  Location: [facility] OR;  Service: Vascular;  Laterality: Right;  . PATCH ANGIOPLASTY  09/05/2015   Procedure: PATCH ANGIOPLASTY RIGHT CAROTID ARTERY USING HEMASHIELD PLATINUM FINESSE PATCH;  Surgeon: Sherren Kerns, MD;  Location: [facility] OR;  Service: Vascular;;  . TONSILLECTOMY    . VASECTOMY      Social History   Socioeconomic History  . Marital status: Married    Spouse name: Not on file  . Number of children: Not on file  . Years of education: Not on file  . Highest education level: Not on file  Occupational History  . Not on file  Tobacco Use  . Smoking status: Never Smoker  . Smokeless tobacco: Former Neurosurgeon    Types: Engineer, drilling  . Vaping Use: Never used  Substance and Sexual Activity  . Alcohol use: Yes    Alcohol/week: 7.0 standard drinks    Types: 7 Cans of beer per week    Comment: pt states that he use to have a beer occasionly but has not had one in about one month  . Drug use: No  . Sexual activity: Not on file  Other Topics Concern  . Not on file  Social History Narrative  . Not on file   Social Determinants of Health   Financial Resource Strain: Not on file  Food Insecurity: Not on file   Transportation Needs: Not on file  Physical Activity: Not on file  Stress: Not on file  Social Connections: Not on file  Intimate Partner Violence: Not on file   Current Outpatient Medications on File Prior to Visit  Medication Sig Dispense Refill  . aspirin 81 MG tablet Take 81 mg by mouth daily.    [name] Kitchen atorvastatin (LIPITOR) 40 MG tablet     . gentamicin cream (GARAMYCIN) 0.1 % Apply 1 application topically 2 (two) times daily. 15 g 1  . ibuprofen (ADVIL) 800 MG tablet Take 1 tablet (800 mg total) by mouth 3 (three) times daily. 90 tablet 1  . JUBLIA 10 % SOLN APPLY TO THE AFFECTED AREA DAILY  3  . lisinopril-hydrochlorothiazide (ZESTORETIC) 20-12.5 MG tablet Take 1 tablet by mouth daily.    . meloxicam (MOBIC) 15 MG tablet Take 1 tablet (15 mg total) by mouth daily. 30 tablet 1  . sildenafil (VIAGRA) 100 MG tablet 1 tablet as needed    . terbinafine (LAMISIL) 250 MG tablet     . oxyCODONE-acetaminophen (PERCOCET) 5-325 MG tablet Take 1 tablet by mouth every 4 (four) hours as needed for severe pain. (Patient not taking: Reported on 08/09/2020) 30 tablet 0   No current facility-administered medications on file prior to  visit.     Vitals:   08/09/20 1232 08/09/20 1235  BP: (!) 197/85 (!) 188/84  Pulse: 60   Resp: 20   Temp: 98.3 F (36.8 C)   SpO2: (!) 59%   Weight: 223 lb (101.2 kg)   Height: 5\' 9"  (1.753 m)    Neck: Well-healed carotid scar  Extremities: 2+ radial 1+ dorsalis pedis pulses bilaterally  Musculoskeletal pretibial and pedal edema bilaterally, dressings on medial right and left lateral foot   Data: Patient had a carotid duplex exam today which suggested some mild narrowing of his right subclavian artery.  He had no significant internal carotid artery stenosis bilaterally.  Assessment: #1 lower extremity edema chronic.  He has previously worn compression stockings.  If this is continuing to persist after his trauma from his recent foot operation consideration  could be given for bilateral lower extremity reflux exam to look at venous reflux etiology for his leg swelling  2.  Carotid occlusive disease no significant recurrence mild right subclavian artery stenosis discussed with patient measuring blood pressure in his left arm.  He does not have any symptoms in his right arm and this is a fairly mild stenosis.  However, it may slightly have a gradient in the right arm.  Follow-up carotid duplex exam in 1 year.  Follow-up in our APP clinic since his exam has been stable for several years now  3.  Hypertension.  Patient's blood pressure was noted to be fairly elevated on our exam today.  He usually takes his blood pressure medication in the evening and says he has had a stressful morning.  He will continue to evaluate this and discuss with his primary care physician further if his blood pressure remains elevated.  Plan: See above  , MD [facility] Office: [phone]

## 2020-08-13 ENCOUNTER — Ambulatory Visit (INDEPENDENT_AMBULATORY_CARE_PROVIDER_SITE_OTHER): Payer: 59 | Admitting: Podiatry

## 2020-08-13 ENCOUNTER — Other Ambulatory Visit: Payer: Self-pay

## 2020-08-13 DIAGNOSIS — Z9889 Other specified postprocedural states: Secondary | ICD-10-CM

## 2020-08-13 DIAGNOSIS — M21622 Bunionette of left foot: Secondary | ICD-10-CM

## 2020-08-13 DIAGNOSIS — M722 Plantar fascial fibromatosis: Secondary | ICD-10-CM

## 2020-08-13 NOTE — Progress Notes (Signed)
   Subjective:  Patient presents today status post tailor's bunionectomy left, EPF right.. DOS: 07/26/2020.  Patient states that since last visit he has had bilateral lower extremity swelling and edema.  He was concern for DVT but was evaluated negative for DVT.  His vascular surgeons recommended resuming his compression stockings when sutures were removed.  No new complaints at this time  Past Medical History:  Diagnosis Date  . Carotid artery occlusion   . Cellulitis   . Erectile dysfunction   . Hyperlipidemia   . Hypertension       Objective/Physical Exam Neurovascular status intact.  Skin incisions appear to be well coapted with sutures intact. No sign of infectious process noted. No dehiscence. No active bleeding noted. Moderate edema noted to the surgical extremity.   Assessment: 1. s/p tailor's bunionectomy left.  EPF right.. DOS: 07/26/2020   Plan of Care:  1. Patient was evaluated.  2.  Sutures removed today 3.  Continue weightbearing in the postsurgical shoe left foot 4.  Recommend good supportive shoes right foot 5.  Return to clinic in 2 weeks for follow-up x-ray and to transition the patient back in a good tennis shoes for the left foot  Felecia Shelling, DPM [facility]  Dr. Felecia Shelling, DPM    [address] [place], [place] [address]                Office [phone]  Fax [phone]

## 2020-08-27 ENCOUNTER — Encounter: Payer: 59 | Admitting: Podiatry

## 2020-08-29 ENCOUNTER — Ambulatory Visit (INDEPENDENT_AMBULATORY_CARE_PROVIDER_SITE_OTHER): Payer: 59

## 2020-08-29 ENCOUNTER — Ambulatory Visit (INDEPENDENT_AMBULATORY_CARE_PROVIDER_SITE_OTHER): Payer: 59 | Admitting: Podiatry

## 2020-08-29 ENCOUNTER — Other Ambulatory Visit: Payer: Self-pay

## 2020-08-29 DIAGNOSIS — Z9889 Other specified postprocedural states: Secondary | ICD-10-CM

## 2020-08-29 NOTE — Progress Notes (Signed)
   Subjective:  Patient presents today status post tailor's bunionectomy left, EPF right.. DOS: 07/26/2020.  Patient states that he is doing well.  No new complaints at this time  Past Medical History:  Diagnosis Date  . Carotid artery occlusion   . Cellulitis   . Erectile dysfunction   . Hyperlipidemia   . Hypertension       Objective/Physical Exam Neurovascular status intact.  Skin incisions appear to be well coapted and healed. No sign of infectious process noted. No dehiscence. No active bleeding noted. Moderate edema noted to the surgical extremity.  Radiographic exam Good routine healing of the fifth metatarsal noted.  The orthopedic screw appears intact.  Good correction of the PIP tailor's bunion deformity noted.   Assessment: 1. s/p tailor's bunionectomy left.  EPF right.. DOS: 07/26/2020   Plan of Care:  1. Patient was evaluated.  X-rays reviewed today 2.  Patient may now transition out of the postsurgical shoe into good supportive sneakers 3.  Slowly increase activity over the next 4 weeks 4.  Compression ankle sleeve dispensed.  Either wear the compression ankle sleeve or compression socks daily.  5.  Return to clinic in 4 weeks for final follow-up x-ray and evaluation  Felecia Shelling, DPM [facility]  Dr. Felecia Shelling, DPM    [address] [place], [place] [address]                Office [phone]  Fax [phone]

## 2020-10-01 ENCOUNTER — Other Ambulatory Visit: Payer: Self-pay

## 2020-10-01 ENCOUNTER — Ambulatory Visit (INDEPENDENT_AMBULATORY_CARE_PROVIDER_SITE_OTHER): Payer: 59 | Admitting: Podiatry

## 2020-10-01 ENCOUNTER — Ambulatory Visit (INDEPENDENT_AMBULATORY_CARE_PROVIDER_SITE_OTHER): Payer: 59

## 2020-10-01 DIAGNOSIS — Z9889 Other specified postprocedural states: Secondary | ICD-10-CM

## 2020-10-01 DIAGNOSIS — M21622 Bunionette of left foot: Secondary | ICD-10-CM

## 2020-10-01 NOTE — Progress Notes (Signed)
   Subjective:  Patient presents today status post tailor's bunionectomy left, EPF right.. DOS: 07/26/2020.  Patient states that he is doing well.  He is having some pain to the bilateral balls of the feet.  He presents for follow-up treatment evaluation  Past Medical History:  Diagnosis Date  . Carotid artery occlusion   . Cellulitis   . Erectile dysfunction   . Hyperlipidemia   . Hypertension       Objective/Physical Exam Neurovascular status intact.  Skin incisions appear to be well coapted and healed. No sign of infectious process noted. No dehiscence. No active bleeding noted.  Negative for any significant edema noted to the surgical extremity.  Radiographic exam Good routine healing of the fifth metatarsal noted.  The orthopedic screw appears intact.  No significant change since prior x-rays  Assessment: 1. s/p tailor's bunionectomy left.  EPF right.. DOS: 07/26/2020   Plan of Care:  1. Patient was evaluated.  X-rays reviewed today 2.  Patient may now resume full activity no restrictions. 3.  Offloading felt metatarsal pads were provided and applied to the insoles of the shoes to offload pressure from the balls of the feet 4.  Continue wearing orthotics as needed 5.  Return to clinic as needed  Felecia Shelling, DPM [facility]  Dr. Felecia Shelling, DPM    [address] [place], [place] [address]                Office [phone]  Fax [phone]

## 2020-10-25 ENCOUNTER — Telehealth: Payer: Self-pay | Admitting: *Deleted

## 2020-10-25 NOTE — Telephone Encounter (Signed)
Patient's wife is wanting to know where they may purchase additional pads(felt Metatarsal) that was given yesterday in office to put under insoles. Please contact patient.  Called and gave patient the name of  the pads and that they could be purchased at our front desk. He verbalized understanding.

## 2020-11-21 ENCOUNTER — Ambulatory Visit (INDEPENDENT_AMBULATORY_CARE_PROVIDER_SITE_OTHER): Payer: 59

## 2020-11-21 ENCOUNTER — Other Ambulatory Visit: Payer: Self-pay

## 2020-11-21 ENCOUNTER — Ambulatory Visit (INDEPENDENT_AMBULATORY_CARE_PROVIDER_SITE_OTHER): Payer: 59 | Admitting: Podiatry

## 2020-11-21 DIAGNOSIS — Z9889 Other specified postprocedural states: Secondary | ICD-10-CM

## 2020-11-21 DIAGNOSIS — S92354D Nondisplaced fracture of fifth metatarsal bone, right foot, subsequent encounter for fracture with routine healing: Secondary | ICD-10-CM

## 2020-11-21 NOTE — Progress Notes (Signed)
   Subjective:  Patient presents today status post tailor's bunionectomy left, EPF right.. DOS: 07/26/2020.  Patient states that he has been having severe pain and tenderness to the left foot.  He presents today with his wife and they are very frustrated with the amount of pain that he is having to the foot.  He presents for further treatment evaluation  Past Medical History:  Diagnosis Date  . Carotid artery occlusion   . Cellulitis   . Erectile dysfunction   . Hyperlipidemia   . Hypertension       Objective/Physical Exam Neurovascular status intact.  Skin incisions appear to be well coapted and healed. No sign of infectious process noted. No dehiscence. No active bleeding noted.  There continues to be some moderate edema around the foot and surgical area Radiographic exam Around the osteotomy site of the fifth metatarsal there does appear to be some cortical breakdown and fragmentation.  The orthopedic screw appears to be intact however there is fracture fragment and breakdown around the screw.    Assessment: 1. s/p tailor's bunionectomy left.  EPF right.. DOS: 07/26/2020 2.  Cortical breakdown around the osteotomy site left foot   Plan of Care:  1. Patient was evaluated.  X-rays reviewed today 2.  Explained to the patient that there is fragmentation around the osteotomy site of the fifth metatarsal.  We need to reduce his activity and return to a cam boot. 3.  Cam boot dispensed today.  Minimal weightbearing as tolerated 4.  Reassured to the patient that I do believe these fracture fragments will heal however we do need to reduce activity and reduce pressure on the forefoot. 5.  Continue wearing compression socks daily  6.  Return to clinic in 4 weeks for follow-up x-ray  Felecia Shelling, DPM [facility]  Dr. Felecia Shelling, DPM    [address] [place], [place] [address]                Office [phone]  Fax [phone]

## 2020-12-19 ENCOUNTER — Ambulatory Visit: Payer: 59 | Admitting: Podiatry

## 2020-12-21 ENCOUNTER — Ambulatory Visit (INDEPENDENT_AMBULATORY_CARE_PROVIDER_SITE_OTHER): Payer: 59 | Admitting: Podiatry

## 2020-12-21 ENCOUNTER — Encounter: Payer: Self-pay | Admitting: Podiatry

## 2020-12-21 ENCOUNTER — Ambulatory Visit (INDEPENDENT_AMBULATORY_CARE_PROVIDER_SITE_OTHER): Payer: 59

## 2020-12-21 ENCOUNTER — Other Ambulatory Visit: Payer: Self-pay

## 2020-12-21 DIAGNOSIS — M21622 Bunionette of left foot: Secondary | ICD-10-CM | POA: Diagnosis not present

## 2020-12-21 DIAGNOSIS — M84375K Stress fracture, left foot, subsequent encounter for fracture with nonunion: Secondary | ICD-10-CM | POA: Diagnosis not present

## 2020-12-21 DIAGNOSIS — S92355K Nondisplaced fracture of fifth metatarsal bone, left foot, subsequent encounter for fracture with nonunion: Secondary | ICD-10-CM | POA: Diagnosis not present

## 2020-12-27 NOTE — Progress Notes (Signed)
   Subjective:  Patient presents today status post tailor's bunionectomy left, EPF right.. DOS: 07/26/2020.  Patient states that over the last 4 weeks he has been feeling better.  He has been staying off of his foot but he does have occasional twinges of pain.  He has been weightbearing in the cam boot.  No new complaints this time  Past Medical History:  Diagnosis Date  . Carotid artery occlusion   . Cellulitis   . Erectile dysfunction   . Hyperlipidemia   . Hypertension       Objective: Physical Exam General: The patient is alert and oriented x3 in no acute distress.  Dermatology: Skin is cool, dry and supple bilateral lower extremities. Negative for open lesions or macerations.  Vascular: Palpable pedal pulses bilaterally. No edema or erythema noted. Capillary refill within normal limits.  Neurological: Epicritic and protective threshold grossly intact bilaterally.   Musculoskeletal Exam: All pedal and ankle joints range of motion within normal limits bilateral. Muscle strength 5/5 in all groups bilateral.  There continues to be some sensitivity and pain to palpation along the fifth metatarsal of the foot   Radiographic exam Just proximal to the osteotomy site of the fifth metatarsal there continues to be some cortical breakdown and fragmentation.  The orthopedic screw appears to be intact however there is fracture fragment and breakdown around the screw.  Mostly unchanged since last visit  Assessment: 1. s/p tailor's bunionectomy left.  EPF right.. DOS: 07/26/2020 2.  Fracture fifth metatarsal left with nonunion   Plan of Care:  1. Patient was evaluated.   2.  The patient has now had this fracture since surgery approximately 6 months.  There is been no healing of the fracture site.  Today we will order an Exogen bone stimulator to promote healing 3.  Patient may continue the cam boot.  Over time he may transition out of the cam boot slowly 4.  Return to clinic in 4 to 6  weeks for follow-up x-ray  *Going to [place] for 1 week  Felecia Shelling, DPM [facility]  Dr. Felecia Shelling, DPM    [address] [place], [place] [address]                Office [phone]  Fax [phone]

## 2021-01-21 ENCOUNTER — Other Ambulatory Visit: Payer: Self-pay

## 2021-01-21 ENCOUNTER — Ambulatory Visit (INDEPENDENT_AMBULATORY_CARE_PROVIDER_SITE_OTHER): Payer: 59

## 2021-01-21 ENCOUNTER — Ambulatory Visit: Payer: 59 | Admitting: Podiatry

## 2021-01-21 ENCOUNTER — Ambulatory Visit (INDEPENDENT_AMBULATORY_CARE_PROVIDER_SITE_OTHER): Payer: 59 | Admitting: Podiatry

## 2021-01-21 DIAGNOSIS — M84375K Stress fracture, left foot, subsequent encounter for fracture with nonunion: Secondary | ICD-10-CM

## 2021-01-21 DIAGNOSIS — M7751 Other enthesopathy of right foot: Secondary | ICD-10-CM | POA: Diagnosis not present

## 2021-01-21 DIAGNOSIS — M7741 Metatarsalgia, right foot: Secondary | ICD-10-CM | POA: Diagnosis not present

## 2021-01-21 MED ORDER — METHYLPREDNISOLONE 4 MG PO TBPK
ORAL_TABLET | ORAL | 0 refills | Status: DC
Start: 2021-01-21 — End: 2021-04-10

## 2021-01-21 NOTE — Progress Notes (Signed)
   Subjective:  Patient presents today status post tailor's bunionectomy left, EPF right.. DOS: 07/26/2020.  Patient continues to improve however he still has some minor pain to the area.  There has been some steady improvement.  Patient still has not received the bone stimulator.    Patient has a new complaint today and he states that now the entire forefoot to the right lower extremity is symptomatic.  This may be perhaps because he is compensating and adding additional pressure to the forefoot of the right foot.  He denies a history of injury to the right foot.  He presents for further treatment and evaluation  Past Medical History:  Diagnosis Date   Carotid artery occlusion    Cellulitis    Erectile dysfunction    Hyperlipidemia    Hypertension       Objective: Physical Exam General: The patient is alert and oriented x3 in no acute distress.  Dermatology: Skin is cool, dry and supple bilateral lower extremities. Negative for open lesions or macerations.  Vascular: Palpable pedal pulses bilaterally. No edema or erythema noted. Capillary refill within normal limits.  Neurological: Epicritic and protective threshold grossly intact bilaterally.   Musculoskeletal Exam: All pedal and ankle joints range of motion within normal limits bilateral. Muscle strength 5/5 in all groups bilateral.  There continues to be some sensitivity and pain to palpation along the fifth metatarsal of the foot  There is pain on palpation diffusely throughout the entire right forefoot and MTPJ's   Radiographic exam Just proximal to the osteotomy site of the fifth metatarsal there continues to be some cortical breakdown and fragmentation.  The orthopedic screw appears to be intact however there is fracture fragment and breakdown around the screw.  Mostly unchanged since last visit  Assessment: 1. s/p tailor's bunionectomy left.  EPF right.. DOS: 07/26/2020 2.  Fracture fifth metatarsal left with nonunion 3.   Metatarsalgia/MTPJ capsulitis second and third right foot   Plan of Care:  1. Patient was evaluated.   2.  Apparently the Exogen Bone Stimulator rep is putting a rush order on the stimulator.  The bone stimulator is still pending and has not been dispensed or initiated yet 3.  Approval letter was received today for the bone stimulator through the patient's insurance 4.  Injection of 0.5 cc Celestone Soluspan injected into the second and third MTPJ of the right foot 5.  Prescription for Medrol Dosepak 6.  Continue weightbearing in the cam boot left lower extremity 7.  Return to clinic in 6 weeks   Felecia Shelling, DPM [facility]  Dr. Felecia Shelling, DPM    [address] [place], [place] [address]                Office [phone]  Fax [phone]

## 2021-01-28 DIAGNOSIS — M7751 Other enthesopathy of right foot: Secondary | ICD-10-CM | POA: Diagnosis not present

## 2021-01-28 MED ORDER — BETAMETHASONE SOD PHOS & ACET 6 (3-3) MG/ML IJ SUSP
3.0000 mg | Freq: Once | INTRAMUSCULAR | Status: AC
  Administered 2021-01-28: 3 mg via INTRA_ARTICULAR

## 2021-02-08 ENCOUNTER — Telehealth: Payer: Self-pay | Admitting: Podiatry

## 2021-02-08 NOTE — Telephone Encounter (Signed)
Patient received a Cam boot in April 2022, and it is wearing out. Patient wife will need to come in and pick up another boot. He will need a size Large not sure if its the short or long boot, she will have to give you the information when she comes in, either 7/1 or 7/5.

## 2021-02-27 ENCOUNTER — Other Ambulatory Visit: Payer: Self-pay

## 2021-02-27 ENCOUNTER — Ambulatory Visit (INDEPENDENT_AMBULATORY_CARE_PROVIDER_SITE_OTHER): Payer: 59 | Admitting: Podiatry

## 2021-02-27 ENCOUNTER — Ambulatory Visit (INDEPENDENT_AMBULATORY_CARE_PROVIDER_SITE_OTHER): Payer: 59

## 2021-02-27 DIAGNOSIS — M84375K Stress fracture, left foot, subsequent encounter for fracture with nonunion: Secondary | ICD-10-CM | POA: Diagnosis not present

## 2021-02-27 DIAGNOSIS — S92355K Nondisplaced fracture of fifth metatarsal bone, left foot, subsequent encounter for fracture with nonunion: Secondary | ICD-10-CM | POA: Diagnosis not present

## 2021-02-27 NOTE — Progress Notes (Signed)
   Subjective:  Patient presents today status post tailor's bunionectomy left, EPF right.. DOS: 07/26/2020.  Patient has been weightbearing in the cam boot.  He has been using the bone stimulator daily for about 3 weeks now.  He presents for further treatment and evaluation.  No new complaints at this time  Past Medical History:  Diagnosis Date   Carotid artery occlusion    Cellulitis    Erectile dysfunction    Hyperlipidemia    Hypertension       Objective: Physical Exam General: The patient is alert and oriented x3 in no acute distress.  Dermatology: Skin is cool, dry and supple bilateral lower extremities. Negative for open lesions or macerations.  Vascular: Palpable pedal pulses bilaterally. No edema or erythema noted. Capillary refill within normal limits.  Neurological: Epicritic and protective threshold grossly intact bilaterally.   Musculoskeletal Exam: All pedal and ankle joints range of motion within normal limits bilateral. Muscle strength 5/5 in all groups bilateral.  There continues to be some sensitivity and pain to palpation along the fifth metatarsal of the foot  Radiographic exam Just proximal to the osteotomy site of the fifth metatarsal there continues to be some cortical breakdown and fragmentation, however there appears to be significant improvement compared to prior x-rays taken.  Orthopedic screw remains intact.  There does appear to be some osseous union of the fragmentation and routine healing is noted  Assessment: 1. s/p tailor's bunionectomy left.  EPF right.. DOS: 07/26/2020 2.  Fracture fifth metatarsal left with nonunion    Plan of Care:  1. Patient was evaluated.   2.  Continue Exogen Bone Stimulator daily 3.  Patient may transition out of the cam boot into the postoperative shoe 4.  Return to clinic in 6 weeks for follow-up x-ray  Felecia Shelling, DPM [facility]  Dr. Felecia Shelling, DPM    [address] [place], [place] [address]                Office [phone]  Fax [phone]

## 2021-04-10 ENCOUNTER — Ambulatory Visit (INDEPENDENT_AMBULATORY_CARE_PROVIDER_SITE_OTHER): Payer: 59 | Admitting: Podiatry

## 2021-04-10 ENCOUNTER — Ambulatory Visit (INDEPENDENT_AMBULATORY_CARE_PROVIDER_SITE_OTHER): Payer: 59

## 2021-04-10 ENCOUNTER — Other Ambulatory Visit: Payer: Self-pay

## 2021-04-10 DIAGNOSIS — M722 Plantar fascial fibromatosis: Secondary | ICD-10-CM

## 2021-04-10 DIAGNOSIS — M84375K Stress fracture, left foot, subsequent encounter for fracture with nonunion: Secondary | ICD-10-CM | POA: Diagnosis not present

## 2021-04-10 MED ORDER — BETAMETHASONE SOD PHOS & ACET 6 (3-3) MG/ML IJ SUSP
3.0000 mg | Freq: Once | INTRAMUSCULAR | Status: AC
  Administered 2021-04-10: 3 mg via INTRA_ARTICULAR

## 2021-04-10 MED ORDER — METHYLPREDNISOLONE 4 MG PO TBPK: ORAL_TABLET | ORAL | 0 refills | Status: DC

## 2021-04-10 NOTE — Progress Notes (Signed)
   Subjective:  Patient presents today status post tailor's bunionectomy left, EPF right.. DOS: 07/26/2020.  Patient has been weightbearing in the cam boot.  Patient states of the left foot is now asymptomatic.  He does not have any pain associated to the area.  In the evenings he wear sandals and slippers with no pain.  He is suffering for some right foot pain and heel pain likely due to compensation.  He states that he has significant pain for about 5 minutes in the morning when getting out of bed.  He presents for further treatment and evaluation  Past Medical History:  Diagnosis Date   Carotid artery occlusion    Cellulitis    Erectile dysfunction    Hyperlipidemia    Hypertension       Objective: Physical Exam General: The patient is alert and oriented x3 in no acute distress.  Dermatology: Skin is cool, dry and supple bilateral lower extremities. Negative for open lesions or macerations.  Vascular: Palpable pedal pulses bilaterally. No edema or erythema noted. Capillary refill within normal limits.  Neurological: Epicritic and protective threshold grossly intact bilaterally.   Musculoskeletal Exam: All pedal and ankle joints range of motion within normal limits bilateral. Muscle strength 5/5 in all groups bilateral.  Negative for any significant pain or tenderness along the fifth metatarsal of the left foot.  There is some pain on palpation to the medial calcaneal tubercle plantar fascia right.  Radiographic exam Overall there appears to be significant improvement compared to prior x-rays taken.  The orthopedic screw was stable and intact.  It appears that there is good osseous union of the fracture fragments and osteotomy site.  Assessment: 1. s/p tailor's bunionectomy left.  EPF right.. DOS: 07/26/2020 2.  Fracture fifth metatarsal left with nonunion 3.  Plantar fasciitis right    Plan of Care:  1. Patient was evaluated.  X-rays reviewed today. 2.  Since the x-rays look  significantly improved and there looks like there is good osseous union of the fracture fragments and osteotomy site the patient can discontinue the cam boot.  Return to normal good supportive shoes. 3.  Patient has orthotics.  Resume orthotics with shoes 4.  Injection of 0.5 cc Celestone Soluspan injected into the right plantar fascia 5.  Prescription for Medrol Dosepak 6.  No prescription for NSAIDs since the patient has HTN.  Recommend OTC Tylenol as needed 7.  Return to clinic in 6 weeks  Felecia Shelling, DPM [facility]  Dr. Felecia Shelling, DPM    [address] [place], [place] [address]                Office [phone]  Fax [phone]

## 2021-04-17 ENCOUNTER — Telehealth: Payer: Self-pay | Admitting: *Deleted

## 2021-04-17 ENCOUNTER — Other Ambulatory Visit: Payer: Self-pay | Admitting: Podiatry

## 2021-04-17 NOTE — Telephone Encounter (Signed)
Patient's wife is calling for a prescription that was supposed to be sent to pharmacy on file (Medrol Dosepak) 4 mg tablet.  Returned call back to patient after talking to pharmacy and the medication has been ready for pick up since 04/10/21,verbalized understanding and will pick up.

## 2021-04-19 NOTE — Telephone Encounter (Signed)
Please Advise

## 2021-05-22 ENCOUNTER — Ambulatory Visit (INDEPENDENT_AMBULATORY_CARE_PROVIDER_SITE_OTHER): Payer: 59 | Admitting: Podiatry

## 2021-05-22 ENCOUNTER — Other Ambulatory Visit: Payer: Self-pay

## 2021-05-22 DIAGNOSIS — M722 Plantar fascial fibromatosis: Secondary | ICD-10-CM | POA: Diagnosis not present

## 2021-05-22 DIAGNOSIS — M7741 Metatarsalgia, right foot: Secondary | ICD-10-CM | POA: Diagnosis not present

## 2021-05-22 NOTE — Progress Notes (Signed)
   Subjective:  Patient presents today status post tailor's bunionectomy left, EPF right.. DOS: 07/26/2020.  Patient states that he is doing very well.  He is ambulating in shoes and he has no symptoms associated to the tailor's bunion area.  He complains today of generalized foot pain bilateral.  He states that he begins to get pain only after being on his feet for a few hours of the day.  Past Medical History:  Diagnosis Date   Carotid artery occlusion    Cellulitis    Erectile dysfunction    Hyperlipidemia    Hypertension       Objective: Physical Exam General: The patient is alert and oriented x3 in no acute distress.  Dermatology: Skin is cool, dry and supple bilateral lower extremities. Negative for open lesions or macerations.  Vascular: Palpable pedal pulses bilaterally. No edema or erythema noted. Capillary refill within normal limits.  Neurological: Epicritic and protective threshold grossly intact bilaterally.   Musculoskeletal Exam: All pedal and ankle joints range of motion within normal limits bilateral. Muscle strength 5/5 in all groups bilateral.  Negative for any significant pain or tenderness along the fifth metatarsal of the left foot.  There is some pain on palpation to the medial calcaneal tubercle plantar fascia right.   Assessment: 1. s/p tailor's bunionectomy left.  EPF right.. DOS: 07/26/2020 2.  Fracture fifth metatarsal left with nonunion 3.  Plantar fasciitis right    Plan of Care:  1. Patient was evaluated.  Beginning today the patient may slowly increase to full activity no restrictions 2.  Overall the patient is doing very well.  The tailor's bunion area to the left foot is completely asymptomatic.  He is able to ambulate without any symptoms or pain 3.  The patient continues to have some global metatarsalgia and foot pain.  Continue custom orthotics and good supportive sneakers. 4.  Continue plantar fascial braces bilateral 5.  Continue OTC  NSAIDs as needed 6.  Return to clinic as needed  Felecia Shelling, DPM [facility]  Dr. Felecia Shelling, DPM    [address] [place], [place] [address]                Office [phone]  Fax [phone]

## 2021-06-28 ENCOUNTER — Other Ambulatory Visit: Payer: Self-pay

## 2021-06-28 DIAGNOSIS — I6521 Occlusion and stenosis of right carotid artery: Secondary | ICD-10-CM

## 2021-07-23 NOTE — Progress Notes (Signed)
HISTORY AND PHYSICAL     CC:  follow up. Requesting Provider:  Darrin [facility] M*  HPI: This is a 65 y.o. male here for follow up for carotid artery stenosis.  Pt is s/p right CEA for asymptomatic carotid artery stenosis on 09/05/2015 by [name].    Pt was last seen December 2021 and at that time he was doing well without stroke sx.  He did have chronic lower extremity edema that was worse since having bilateral foot surgery.  He had been wearing compression but had not worn them recently due to sutures in his feet.  He discussed with pt that if it continued, we could get a venous duplex to evaluate for venous reflux.  He had a mild right SCA stenosis and [name] had discussed with pt to take his BP in the left arm.   His carotid duplex revealed 1-39% bilateral ICA stenosis.  Pt returns today for follow up.    Pt denies any amaurosis fugax, speech difficulties, weakness, numbness, paralysis or clumsiness or facial droop.    He states he does get swelling in both legs equally.  He does wear compression socks, which help.  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 He states the swelling is better when he is up and about and active.  He denies any non healing wounds but did have a long healing process for foot surgery.  This did eventually heal.  He does not have any rest pain, non healing wounds or claudication.    The pt is on a statin for cholesterol management.  The pt is on a daily aspirin.   Other AC:  none The pt is on ARB for hypertension.   The pt is not diabetic.   Tobacco hx:  never  Pt does know if he has family hx of AAA.  His mother died in her sleep and they were told possibly from an aneurysm in her abdomen.  She was a smoker.   Past Medical History:  Diagnosis Date   Carotid artery occlusion    Cellulitis    Erectile dysfunction    Hyperlipidemia    Hypertension     Past Surgical History:  Procedure Laterality Date   COLONOSCOPY      ENDARTERECTOMY Right 09/05/2015   Procedure: ENDARTERECTOMY CAROTID RIGHT;  Surgeon: Sherren Kerns, MD;  Location: [facility] OR;  Service: Vascular;  Laterality: Right;   PATCH ANGIOPLASTY  09/05/2015   Procedure: PATCH ANGIOPLASTY RIGHT CAROTID ARTERY USING HEMASHIELD PLATINUM FINESSE PATCH;  Surgeon: Sherren Kerns, MD;  Location: [facility] OR;  Service: Vascular;;   TONSILLECTOMY     VASECTOMY      Allergies  Allergen Reactions   Penicillins Rash    Has patient had a PCN reaction causing immediate rash, facial/tongue/throat swelling, SOB or lightheadedness with hypotension: Yes Has patient had a PCN reaction causing severe rash involving mucus membranes or skin necrosis: No Has patient had a PCN reaction that required hospitalization No Has patient had a PCN reaction occurring within the last 10 years: No If all of the above answers are "NO", then may proceed with Cephalosporin use.     Current Outpatient Medications  Medication Sig Dispense Refill   amLODipine (NORVASC) 2.5 MG tablet Take 2.5 mg by mouth daily.     aspirin 81 MG tablet Take 81 mg by mouth daily.     atorvastatin (LIPITOR) 40 MG tablet      gentamicin cream (  GARAMYCIN) 0.1 % Apply 1 application topically 2 (two) times daily. 15 g 1   ibuprofen (ADVIL) 800 MG tablet Take 1 tablet (800 mg total) by mouth 3 (three) times daily. 90 tablet 1   irbesartan-hydrochlorothiazide (AVALIDE) 300-12.5 MG tablet Take 1 tablet by mouth daily.     JUBLIA 10 % SOLN APPLY TO THE AFFECTED AREA DAILY  3   lisinopril-hydrochlorothiazide (ZESTORETIC) 20-12.5 MG tablet Take 1 tablet by mouth daily.     meloxicam (MOBIC) 15 MG tablet Take 1 tablet (15 mg total) by mouth daily. 30 tablet 1   methylPREDNISolone (MEDROL DOSEPAK) 4 MG TBPK tablet TAKE 6 TABLETS ON DAY 1 AS DIRECTED ON PACKAGE AND DECREASE BY 1 TAB EACH DAY FOR A TOTAL OF 6 DAYS 21 each 0   oxyCODONE-acetaminophen (PERCOCET) 5-325 MG tablet Take 1 tablet by mouth every 4 (four) hours as  needed for severe pain. 30 tablet 0   sildenafil (VIAGRA) 100 MG tablet 1 tablet as needed     terbinafine (LAMISIL) 250 MG tablet      No current facility-administered medications for this visit.    Family History  Problem Relation Age of Onset   Other Mother        circulatory problems   Heart attack Mother    Deep vein thrombosis Mother    Heart disease Mother    Hyperlipidemia Father    Hypertension Father    Diabetes Father    Deep vein thrombosis Father    Heart disease Father        before age 30   Heart attack Father    Peripheral vascular disease Father    Hypertension Brother    Hyperlipidemia Brother     Social History   Socioeconomic History   Marital status: Married    Spouse name: Not on file   Number of children: Not on file   Years of education: Not on file   Highest education level: Not on file  Occupational History   Not on file  Tobacco Use   Smoking status: Never   Smokeless tobacco: Former    Types: Chew    Quit date: 01/01/2015  Vaping Use   Vaping Use: Never used  Substance and Sexual Activity   Alcohol use: Yes    Alcohol/week: 7.0 standard drinks    Types: 7 Cans of beer per week    Comment: pt states that he use to have a beer occasionly but has not had one in about one month   Drug use: No   Sexual activity: Not on file  Other Topics Concern   Not on file  Social History Narrative   Not on file   Social Determinants of Health   Financial Resource Strain: Not on file  Food Insecurity: Not on file  Transportation Needs: Not on file  Physical Activity: Not on file  Stress: Not on file  Social Connections: Not on file  Intimate Partner Violence: Not on file     REVIEW OF SYSTEMS:    denotes positive finding,  denotes negative finding Cardiac  Comments:  Chest pain or chest pressure:    Shortness of breath upon exertion:    Short of breath when lying flat:    Irregular heart rhythm:        Vascular    Pain in  calf, thigh, or hip brought on by ambulation:    Pain in feet at night that wakes you up from your sleep:  Blood clot in your veins:    Leg swelling:         Pulmonary    Oxygen at home:    Productive cough:     Wheezing:         Neurologic    Sudden weakness in arms or legs:     Sudden numbness in arms or legs:     Sudden onset of difficulty speaking or slurred speech:    Temporary loss of vision in one eye:     Problems with dizziness:         Gastrointestinal    Blood in stool:     Vomited blood:         Genitourinary    Burning when urinating:     Blood in urine:        Psychiatric    Major depression:         Hematologic    Bleeding problems:    Problems with blood clotting too easily:        Skin    Rashes or ulcers:        Constitutional    Fever or chills:      PHYSICAL EXAMINATION:  Today's Vitals   07/26/21 1241 07/26/21 1243  BP: (!) 154/73 128/70  Pulse: 61   Resp: 20   Temp: 98.1 F (36.7 C)   TempSrc: Temporal   SpO2: 98%   Weight: 222 lb 8 oz (100.9 kg)   Height: 5\' 9"  (1.753 m)    Body mass index is 32.86 kg/m.   General:  WDWN in NAD; vital signs documented above Gait: Not observed HENT: WNL, normocephalic Pulmonary: normal non-labored breathing Cardiac: regular HR, without carotid bruits Abdomen: soft, NT; aortic pulse is not palpable Skin: without rashes Vascular Exam/Pulses: Palpable radial pulses bilaterally.  He does have +doppler signals bilateral DP/PT/peroneal.  1+ pitting ankle edema Extremities: without ischemic changes, without Gangrene , without cellulitis; without open wounds Musculoskeletal: no muscle wasting or atrophy  Neurologic: A&O X 3; moving all extremities equally; speech is fluent/normal Psychiatric:  The pt has Normal affect.   Non-Invasive Vascular Imaging:   Carotid Duplex on 07/26/2021: Right:  1-39% ICA stenosis Left:  1-39% ICA stenosis Vertebrals:  Bilateral vertebral arteries demonstrate  antegrade flow.  Subclavians: Normal flow hemodynamics were seen in bilateral subclavian arteries  Previous Carotid duplex on 08/09/2020: Right: 1-39% ICA stenosis Left:   1-39% ICA stenosis Vertebrals:  Bilateral vertebral arteries demonstrate antegrade flow; right is atypical.  Subclavians: Right subclavian artery was stenotic. Normal flow hemodynamics were seen in the left subclavian artery.   ASSESSMENT/PLAN:: 65 y.o. male here for follow up carotid artery stenosis and is s/p right CEA for asymptomatic carotid artery stenosis on 09/05/2015 by Dr. 09/07/2015.    Carotid duplex -duplex today reveals duplex remains 1-39% bilateral ICA stenosis. -pt will f/u in one year with carotid duplex -pt will call sooner should they have any issues. -continue statin/asa   Leg swelling -continue wearing compression and elevating legs.  I gave him a handout about leg swelling with information on proper leg elevation.  He will try to get up and walk around throughout the day while working.  If the swelling worsens, he will contact Darrick Penna and we can do a venous duplex to check for venous reflux.    PAD -sounds as if his father had hx of PAD.  He does have a palpable left DP pulse and the DP/PT/peroneal are all heard with doppler.  We will get an ABI the next time he comes in for carotid duplex.  He will call sooner if he develops any non healing wounds or rest pain.  AAA screening -pt and wife inquire about AAA.  His mother had passed away in her sleep and aortic aneurysm was mentioned as possible cause of death therefore we will get a screening AAA duplex in the near future.    Doreatha Massed, [facility] [phone]  Clinic MD:  Karin Lieu

## 2021-07-26 ENCOUNTER — Other Ambulatory Visit: Payer: Self-pay

## 2021-07-26 ENCOUNTER — Ambulatory Visit (HOSPITAL_COMMUNITY)
Admission: RE | Admit: 2021-07-26 | Discharge: 2021-07-26 | Disposition: A | Discharge status: Home / Self Care | Payer: 59 | Source: Ambulatory Visit | Attending: Physician Assistant | Admitting: Physician Assistant

## 2021-07-26 ENCOUNTER — Ambulatory Visit (INDEPENDENT_AMBULATORY_CARE_PROVIDER_SITE_OTHER): Payer: 59 | Admitting: Physician Assistant

## 2021-07-26 ENCOUNTER — Encounter: Payer: Self-pay | Admitting: Physician Assistant

## 2021-07-26 VITALS — BP 128/70 | HR 61 | Temp 98.1°F | Resp 20 | Ht 69.0 in | Wt 222.5 lb

## 2021-07-26 DIAGNOSIS — N529 Male erectile dysfunction, unspecified: Secondary | ICD-10-CM | POA: Insufficient documentation

## 2021-07-26 DIAGNOSIS — I6521 Occlusion and stenosis of right carotid artery: Secondary | ICD-10-CM | POA: Diagnosis not present

## 2021-07-26 DIAGNOSIS — Z8249 Family history of ischemic heart disease and other diseases of the circulatory system: Secondary | ICD-10-CM | POA: Insufficient documentation

## 2021-07-26 DIAGNOSIS — E7800 Pure hypercholesterolemia, unspecified: Secondary | ICD-10-CM | POA: Insufficient documentation

## 2021-07-29 ENCOUNTER — Other Ambulatory Visit: Payer: Self-pay

## 2021-07-29 DIAGNOSIS — Z1589 Genetic susceptibility to other disease: Secondary | ICD-10-CM

## 2021-08-08 ENCOUNTER — Ambulatory Visit (INDEPENDENT_AMBULATORY_CARE_PROVIDER_SITE_OTHER): Payer: 59 | Admitting: Physician Assistant

## 2021-08-08 ENCOUNTER — Ambulatory Visit (HOSPITAL_COMMUNITY)
Admission: RE | Admit: 2021-08-08 | Discharge: 2021-08-08 | Disposition: A | Discharge status: Home / Self Care | Payer: 59 | Source: Ambulatory Visit | Attending: Physician Assistant | Admitting: Physician Assistant

## 2021-08-08 ENCOUNTER — Other Ambulatory Visit: Payer: Self-pay

## 2021-08-08 VITALS — BP 164/70 | HR 64 | Temp 98.1°F | Resp 20 | Ht 69.0 in | Wt 223.4 lb

## 2021-08-08 DIAGNOSIS — Z136 Encounter for screening for cardiovascular disorders: Secondary | ICD-10-CM

## 2021-08-08 DIAGNOSIS — Z1589 Genetic susceptibility to other disease: Secondary | ICD-10-CM | POA: Diagnosis not present

## 2021-08-08 NOTE — Progress Notes (Signed)
HISTORY AND PHYSICAL     CC:  follow up. Requesting Provider:  Darrin [facility] M*  HPI: This is a 65 y.o. male who is here today for follow up for AAA.  He was last seen a couple of weeks ago for carotid artery stenosis as he is s/p right CEA 09/05/2015 for asymptomatic carotid artery stenosis by [name].  He also has hx of chronic leg swelling that he wears compression.      The pt returns today for follow up for results of screening medicare AAA duplex.  He does not have any new complaints.  The pt is on a statin for cholesterol management.    The pt is on an aspirin.    Other AC:  none The pt is on ARB for hypertension.  The pt does not have diabetes. Tobacco hx:  never  Pt does know if he has family hx of AAA.  His mother died in her sleep and they were told possibly from an aneurysm in her abdomen.  She was a smoker.   Past Medical History:  Diagnosis Date   Carotid artery occlusion    Cellulitis    Erectile dysfunction    Hyperlipidemia    Hypertension     Past Surgical History:  Procedure Laterality Date   COLONOSCOPY     ENDARTERECTOMY Right 09/05/2015   Procedure: ENDARTERECTOMY CAROTID RIGHT;  Surgeon: Sherren Kerns, MD;  Location: [facility] OR;  Service: Vascular;  Laterality: Right;   PATCH ANGIOPLASTY  09/05/2015   Procedure: PATCH ANGIOPLASTY RIGHT CAROTID ARTERY USING HEMASHIELD PLATINUM FINESSE PATCH;  Surgeon: Sherren Kerns, MD;  Location: [facility] OR;  Service: Vascular;;   TONSILLECTOMY     VASECTOMY      Allergies  Allergen Reactions   Penicillins Rash    Has patient had a PCN reaction causing immediate rash, facial/tongue/throat swelling, SOB or lightheadedness with hypotension: Yes Has patient had a PCN reaction causing severe rash involving mucus membranes or skin necrosis: No Has patient had a PCN reaction that required hospitalization No Has patient had a PCN reaction occurring within the last 10 years: No If all of the above answers are "NO",  then may proceed with Cephalosporin use.     Current Outpatient Medications  Medication Sig Dispense Refill   amLODipine (NORVASC) 2.5 MG tablet Take 2.5 mg by mouth daily.     aspirin 81 MG tablet Take 81 mg by mouth daily.     atorvastatin (LIPITOR) 40 MG tablet      gentamicin cream (GARAMYCIN) 0.1 % Apply 1 application topically 2 (two) times daily. 15 g 1   ibuprofen (ADVIL) 800 MG tablet Take 1 tablet (800 mg total) by mouth 3 (three) times daily. 90 tablet 1   irbesartan-hydrochlorothiazide (AVALIDE) 300-12.5 MG tablet Take 1 tablet by mouth daily.     JUBLIA 10 % SOLN APPLY TO THE AFFECTED AREA DAILY  3   lisinopril-hydrochlorothiazide (ZESTORETIC) 20-12.5 MG tablet Take 1 tablet by mouth daily.     meloxicam (MOBIC) 15 MG tablet Take 1 tablet (15 mg total) by mouth daily. 30 tablet 1   methylPREDNISolone (MEDROL DOSEPAK) 4 MG TBPK tablet TAKE 6 TABLETS ON DAY 1 AS DIRECTED ON PACKAGE AND DECREASE BY 1 TAB EACH DAY FOR A TOTAL OF 6 DAYS 21 each 0   oxyCODONE-acetaminophen (PERCOCET) 5-325 MG tablet Take 1 tablet by mouth every 4 (four) hours as needed for severe pain. 30 tablet 0   sildenafil (VIAGRA) 100  MG tablet 1 tablet as needed     terbinafine (LAMISIL) 250 MG tablet      No current facility-administered medications for this visit.    Family History  Problem Relation Age of Onset   Other Mother        circulatory problems   Heart attack Mother    Deep vein thrombosis Mother    Heart disease Mother    Hyperlipidemia Father    Hypertension Father    Diabetes Father    Deep vein thrombosis Father    Heart disease Father        before age 61   Heart attack Father    Peripheral vascular disease Father    Hypertension Brother    Hyperlipidemia Brother     Social History   Socioeconomic History   Marital status: Married    Spouse name: Not on file   Number of children: Not on file   Years of education: Not on file   Highest education level: Not on file   Occupational History   Not on file  Tobacco Use   Smoking status: Never   Smokeless tobacco: Former    Types: Chew    Quit date: 01/01/2015  Vaping Use   Vaping Use: Never used  Substance and Sexual Activity   Alcohol use: Yes    Alcohol/week: 7.0 standard drinks    Types: 7 Cans of beer per week    Comment: pt states that he use to have a beer occasionly but has not had one in about one month   Drug use: No   Sexual activity: Not on file  Other Topics Concern   Not on file  Social History Narrative   Not on file   Social Determinants of Health   Financial Resource Strain: Not on file  Food Insecurity: Not on file  Transportation Needs: Not on file  Physical Activity: Not on file  Stress: Not on file  Social Connections: Not on file  Intimate Partner Violence: Not on file     REVIEW OF SYSTEMS:   [X]  denotes positive finding, [ ]  denotes negative finding Cardiac  Comments:  Chest pain or chest pressure:    Shortness of breath upon exertion:    Short of breath when lying flat:    Irregular heart rhythm:        Vascular    Pain in calf, thigh, or hip brought on by ambulation:    Pain in feet at night that wakes you up from your sleep:     Blood clot in your veins:    Leg swelling:  x       Pulmonary    Oxygen at home:    Productive cough:     Wheezing:         Neurologic    Sudden weakness in arms or legs:     Sudden numbness in arms or legs:     Sudden onset of difficulty speaking or slurred speech:    Temporary loss of vision in one eye:     Problems with dizziness:         Gastrointestinal    Blood in stool:     Vomited blood:         Genitourinary    Burning when urinating:     Blood in urine:        Psychiatric    Major depression:         Hematologic    Bleeding problems:  Problems with blood clotting too easily:        Skin    Rashes or ulcers:        Constitutional    Fever or chills:      PHYSICAL EXAMINATION:  Today's  Vitals   08/08/21 0828  BP: (!) 164/70  Pulse: 64  Resp: 20  Temp: 98.1 F (36.7 C)  TempSrc: Temporal  SpO2: 97%  Weight: 223 lb 6.4 oz (101.3 kg)  Height: 5\' 9"  (1.753 m)   Body mass index is 32.99 kg/m.   General:  WDWN in NAD; vital signs documented above Gait: Normal HENT: WNL, normocephalic Pulmonary: normal non-labored breathing , without wheezing Abdomen: obese Skin: without rashes Extremities: without ischemic changes, without Gangrene , without cellulitis; without open wounds;  Musculoskeletal: no muscle wasting or atrophy  Neurologic: A&O X 3 Psychiatric:  The pt has Normal affect.   Non-Invasive Vascular Imaging:   AAA Arterial duplex on 08/08/2021: Abdominal Aorta Findings:  +--------+-------+----------+----------+--------+--------+--------+   Location AP (cm) Trans (cm) PSV (cm/s) Waveform Thrombus Comments   +--------+-------+----------+----------+--------+--------+--------+   Proximal 2.08    2.11       91                                      +--------+-------+----------+----------+--------+--------+--------+   Mid      1.87    1.94       92                                      +--------+-------+----------+----------+--------+--------+--------+   Distal   1.77    1.90       76                                      +--------+-------+----------+----------+--------+--------+--------+  Summary:  Abdominal Aorta: No evidence of an abdominal aortic aneurysm was  visualized. The largest aortic measurement is 2.1 cm.   ASSESSMENT/PLAN:: 65 y.o. male here for follow up for AAA and remains asymptomatic  AAA screening -duplex today reveals no evidence of AAA -will not schedule any follow up for this.   Carotid stenosis 1-39% bilateral ICA stenosis He is scheduled for one year follow up with duplex  Leg swelling -continue wearing compression.  If swelling worsens, we can schedule him for venous duplex to evaluate for venous reflux.   PAD -father had hx of  PAD.  Will get ABI in one year when he comes in for carotid duplex.    -continue asa/statin He will call sooner if there are any issues before then.   71, [facility] [phone]  Clinic MD:   [phone] on call MD

## 2022-01-02 ENCOUNTER — Ambulatory Visit: Payer: 59 | Admitting: Podiatry

## 2022-02-01 ENCOUNTER — Other Ambulatory Visit: Payer: Self-pay

## 2022-02-01 DIAGNOSIS — S91309A Unspecified open wound, unspecified foot, initial encounter: Secondary | ICD-10-CM

## 2022-02-03 ENCOUNTER — Other Ambulatory Visit: Payer: Self-pay

## 2022-02-03 ENCOUNTER — Ambulatory Visit (HOSPITAL_COMMUNITY)
Admission: RE | Admit: 2022-02-03 | Discharge: 2022-02-03 | Disposition: A | Discharge status: Home / Self Care | Payer: 59 | Source: Ambulatory Visit | Attending: Surgery | Admitting: Surgery

## 2022-02-03 ENCOUNTER — Ambulatory Visit (INDEPENDENT_AMBULATORY_CARE_PROVIDER_SITE_OTHER): Payer: 59 | Admitting: Surgery

## 2022-02-03 ENCOUNTER — Encounter: Payer: Self-pay | Admitting: Surgery

## 2022-02-03 VITALS — BP 157/84 | HR 76 | Temp 97.6°F | Resp 18 | Ht 70.0 in | Wt 220.0 lb

## 2022-02-03 DIAGNOSIS — S91309A Unspecified open wound, unspecified foot, initial encounter: Secondary | ICD-10-CM

## 2022-02-03 DIAGNOSIS — I7025 Atherosclerosis of native arteries of other extremities with ulceration: Secondary | ICD-10-CM | POA: Diagnosis not present

## 2022-02-04 ENCOUNTER — Encounter (HOSPITAL_COMMUNITY)
Admission: RE | Disposition: A | Discharge status: Home / Self Care | Payer: Self-pay | Source: Home / Self Care | Attending: Surgery

## 2022-02-04 ENCOUNTER — Other Ambulatory Visit: Payer: Self-pay

## 2022-02-04 ENCOUNTER — Ambulatory Visit (HOSPITAL_COMMUNITY)
Admission: RE | Admit: 2022-02-04 | Discharge: 2022-02-04 | Disposition: A | Discharge status: Home / Self Care | Payer: 59 | Attending: Surgery | Admitting: Surgery

## 2022-02-04 DIAGNOSIS — E7800 Pure hypercholesterolemia, unspecified: Secondary | ICD-10-CM | POA: Diagnosis not present

## 2022-02-04 DIAGNOSIS — Z87891 Personal history of nicotine dependence: Secondary | ICD-10-CM | POA: Insufficient documentation

## 2022-02-04 DIAGNOSIS — I70235 Atherosclerosis of native arteries of right leg with ulceration of other part of foot: Secondary | ICD-10-CM | POA: Insufficient documentation

## 2022-02-04 DIAGNOSIS — I7025 Atherosclerosis of native arteries of other extremities with ulceration: Secondary | ICD-10-CM

## 2022-02-04 DIAGNOSIS — I70234 Atherosclerosis of native arteries of right leg with ulceration of heel and midfoot: Secondary | ICD-10-CM | POA: Diagnosis not present

## 2022-02-04 DIAGNOSIS — L97529 Non-pressure chronic ulcer of other part of left foot with unspecified severity: Secondary | ICD-10-CM | POA: Diagnosis not present

## 2022-02-04 DIAGNOSIS — I10 Essential (primary) hypertension: Secondary | ICD-10-CM | POA: Diagnosis not present

## 2022-02-04 DIAGNOSIS — I70244 Atherosclerosis of native arteries of left leg with ulceration of heel and midfoot: Secondary | ICD-10-CM | POA: Diagnosis not present

## 2022-02-04 DIAGNOSIS — I70245 Atherosclerosis of native arteries of left leg with ulceration of other part of foot: Secondary | ICD-10-CM | POA: Insufficient documentation

## 2022-02-04 DIAGNOSIS — I70213 Atherosclerosis of native arteries of extremities with intermittent claudication, bilateral legs: Secondary | ICD-10-CM | POA: Diagnosis present

## 2022-02-04 DIAGNOSIS — L97519 Non-pressure chronic ulcer of other part of right foot with unspecified severity: Secondary | ICD-10-CM | POA: Diagnosis not present

## 2022-02-04 HISTORY — PX: ABDOMINAL AORTOGRAM W/LOWER EXTREMITY: CATH118223

## 2022-02-04 HISTORY — PX: PERIPHERAL VASCULAR BALLOON ANGIOPLASTY: CATH118281

## 2022-02-04 LAB — POCT I-STAT, CHEM 8
BUN: 16 mg/dL (ref 8–23)
Calcium, Ion: 1.26 mmol/L (ref 1.15–1.40)
Chloride: 104 mmol/L (ref 98–111)
Creatinine, Ser: 1.1 mg/dL (ref 0.61–1.24)
Glucose, Bld: 109 mg/dL — ABNORMAL HIGH (ref 70–99)
HCT: 41 % (ref 39.0–52.0)
Hemoglobin: 13.9 g/dL (ref 13.0–17.0)
Potassium: 4.1 mmol/L (ref 3.5–5.1)
Sodium: 140 mmol/L (ref 135–145)
TCO2: 25 mmol/L (ref 22–32)

## 2022-02-04 LAB — POCT ACTIVATED CLOTTING TIME
Activated Clotting Time: 221 seconds
Activated Clotting Time: 245 seconds

## 2022-02-04 SURGERY — ABDOMINAL AORTOGRAM W/LOWER EXTREMITY
Anesthesia: LOCAL

## 2022-02-04 MED ORDER — CLOPIDOGREL BISULFATE 75 MG PO TABS: 75.0000 mg | ORAL_TABLET | Freq: Every day | ORAL | Status: DC

## 2022-02-04 MED ORDER — ACETAMINOPHEN 325 MG PO TABS: 650.0000 mg | ORAL_TABLET | ORAL | Status: DC | PRN

## 2022-02-04 MED ORDER — MIDAZOLAM HCL 2 MG/2ML IJ SOLN
INTRAMUSCULAR | Status: DC | PRN
  Administered 2022-02-04: 1 mg via INTRAVENOUS
  Administered 2022-02-04: 2 mg via INTRAVENOUS
  Administered 2022-02-04 (×2): 1 mg via INTRAVENOUS

## 2022-02-04 MED ORDER — HYDRALAZINE HCL 20 MG/ML IJ SOLN: 5.0000 mg | INTRAMUSCULAR | Status: DC | PRN

## 2022-02-04 MED ORDER — SODIUM CHLORIDE 0.9 % IV SOLN: 250.0000 mL | INTRAVENOUS | Status: DC | PRN

## 2022-02-04 MED ORDER — CLOPIDOGREL BISULFATE 75 MG PO TABS: 75.0000 mg | ORAL_TABLET | Freq: Every day | ORAL | 11 refills | Status: DC

## 2022-02-04 MED ORDER — FENTANYL CITRATE (PF) 100 MCG/2ML IJ SOLN
INTRAMUSCULAR | Status: DC | PRN
  Administered 2022-02-04 (×2): 25 ug via INTRAVENOUS
  Administered 2022-02-04: 50 ug via INTRAVENOUS
  Administered 2022-02-04: 25 ug via INTRAVENOUS

## 2022-02-04 MED ORDER — MORPHINE SULFATE (PF) 2 MG/ML IV SOLN: 2.0000 mg | INTRAVENOUS | Status: DC | PRN

## 2022-02-04 MED ORDER — IODIXANOL 320 MG/ML IV SOLN
INTRAVENOUS | Status: DC | PRN
  Administered 2022-02-04: 185 mL

## 2022-02-04 MED ORDER — HEPARIN (PORCINE) IN NACL 1000-0.9 UT/500ML-% IV SOLN
INTRAVENOUS | Status: DC | PRN
  Administered 2022-02-04 (×2): 500 mL

## 2022-02-04 MED ORDER — MIDAZOLAM HCL 2 MG/2ML IJ SOLN
INTRAMUSCULAR | Status: AC
  Filled 2022-02-04: qty 2

## 2022-02-04 MED ORDER — OXYCODONE HCL 5 MG PO TABS: 5.0000 mg | ORAL_TABLET | ORAL | Status: DC | PRN

## 2022-02-04 MED ORDER — ONDANSETRON HCL 4 MG/2ML IJ SOLN: 4.0000 mg | Freq: Four times a day (QID) | INTRAMUSCULAR | Status: DC | PRN

## 2022-02-04 MED ORDER — HEPARIN SODIUM (PORCINE) 1000 UNIT/ML IJ SOLN
INTRAMUSCULAR | Status: AC
  Filled 2022-02-04: qty 10

## 2022-02-04 MED ORDER — SODIUM CHLORIDE 0.9% FLUSH: 3.0000 mL | INTRAVENOUS | Status: DC | PRN

## 2022-02-04 MED ORDER — SODIUM CHLORIDE 0.9% FLUSH: 3.0000 mL | Freq: Two times a day (BID) | INTRAVENOUS | Status: DC

## 2022-02-04 MED ORDER — LIDOCAINE HCL (PF) 1 % IJ SOLN
INTRAMUSCULAR | Status: AC
  Filled 2022-02-04: qty 30

## 2022-02-04 MED ORDER — HEPARIN SODIUM (PORCINE) 1000 UNIT/ML IJ SOLN
INTRAMUSCULAR | Status: DC | PRN
  Administered 2022-02-04: 10000 [IU] via INTRAVENOUS
  Administered 2022-02-04: 2000 [IU] via INTRAVENOUS
  Administered 2022-02-04: 1000 [IU] via INTRAVENOUS

## 2022-02-04 MED ORDER — FENTANYL CITRATE (PF) 100 MCG/2ML IJ SOLN
INTRAMUSCULAR | Status: AC
  Filled 2022-02-04: qty 2

## 2022-02-04 MED ORDER — SODIUM CHLORIDE 0.9 % WEIGHT BASED INFUSION: 1.0000 mL/kg/h | INTRAVENOUS | Status: DC

## 2022-02-04 MED ORDER — HEPARIN (PORCINE) IN NACL 1000-0.9 UT/500ML-% IV SOLN
INTRAVENOUS | Status: AC
  Filled 2022-02-04: qty 1000

## 2022-02-04 MED ORDER — LABETALOL HCL 5 MG/ML IV SOLN: 10.0000 mg | INTRAVENOUS | Status: DC | PRN

## 2022-02-04 MED ORDER — SODIUM CHLORIDE 0.9 % IV SOLN: INTRAVENOUS | Status: DC

## 2022-02-04 MED ORDER — LIDOCAINE HCL (PF) 1 % IJ SOLN
INTRAMUSCULAR | Status: DC | PRN
  Administered 2022-02-04: 18 mL

## 2022-02-04 MED ORDER — CLOPIDOGREL BISULFATE 300 MG PO TABS
300.0000 mg | ORAL_TABLET | Freq: Once | ORAL | Status: AC
  Administered 2022-02-04: 300 mg via ORAL
  Filled 2022-02-04: qty 1

## 2022-02-04 MED ORDER — ASPIRIN 81 MG PO TBEC: 81.0000 mg | DELAYED_RELEASE_TABLET | Freq: Every day | ORAL | Status: DC

## 2022-02-04 SURGICAL SUPPLY — 33 items
BAG SNAP BAND KOVER 36X36 (MISCELLANEOUS) ×2 IMPLANT
BALLN COYOTE ES OTW 3X40X145 (BALLOONS) ×3
BALLN COYOTE OTW 1.5X40X150 (BALLOONS) ×3
BALLN STERLING OTW 2X220X150 (BALLOONS) ×3
BALLN STERLING OTW 2X40X150 (BALLOONS) ×3
BALLN STERLING OTW 3X100X150 (BALLOONS) ×3
BALLN STERLING OTW 3X150X150 (BALLOONS) ×3
BALLN STERLING OTW 5X80X135 (BALLOONS) ×3
BALLOON COYOTE ES OTW 3X40X145 (BALLOONS) IMPLANT
BALLOON COYOTE OTW 1.5X40X150 (BALLOONS) IMPLANT
BALLOON STERLING OTW 2X220X150 (BALLOONS) IMPLANT
BALLOON STERLING OTW 2X40X150 (BALLOONS) IMPLANT
BALLOON STERLING OTW 3X100X150 (BALLOONS) IMPLANT
BALLOON STERLING OTW 3X150X150 (BALLOONS) IMPLANT
BALLOON STERLING OTW 5X80X135 (BALLOONS) IMPLANT
CATH OMNI FLUSH 5F 65CM (CATHETERS) ×1 IMPLANT
CATH QUICKCROSS .035X135CM (MICROCATHETER) ×1 IMPLANT
DCB RANGER 4.0X80 135 (BALLOONS) IMPLANT
DEVICE VASC CLSR CELT ART 6 (Vascular Products) ×1 IMPLANT
GUIDEWIRE ANGLED .035X260CM (WIRE) ×1 IMPLANT
KIT ENCORE 26 ADVANTAGE (KITS) ×1 IMPLANT
KIT MICROPUNCTURE NIT STIFF (SHEATH) ×1 IMPLANT
KIT PV (KITS) ×4 IMPLANT
RANGER DCB 4.0X80 135 (BALLOONS) ×3
SHEATH HIGHFLEX ANSEL 6FRX55 (SHEATH) ×1 IMPLANT
SHEATH PINNACLE 5F 10CM (SHEATH) ×1 IMPLANT
SHEATH PINNACLE 6F 10CM (SHEATH) ×1 IMPLANT
SYR MEDRAD MARK V 150ML (SYRINGE) ×1 IMPLANT
TRANSDUCER W/STOPCOCK (MISCELLANEOUS) ×4 IMPLANT
TRAY PV CATH (CUSTOM PROCEDURE TRAY) ×4 IMPLANT
WIRE BENTSON .035X145CM (WIRE) ×1 IMPLANT
WIRE G V18X300CM (WIRE) ×3 IMPLANT
WIRE SPARTACORE .014X300CM (WIRE) ×1 IMPLANT

## 2022-02-04 NOTE — Op Note (Signed)
Patient name: Clinton [name] MRN: [id number] DOB: March 09, 1956 Sex: male  02/04/2022 Pre-operative Diagnosis: Bilateral lower extremity ulcers Post-operative diagnosis:  Same Surgeon:  Durene Cal Procedure Performed:  1.  Ultrasound-guided access, left femoral artery  2.  Abdominal aortogram  3.  Bilateral lower extremity runoff  4.  Drug-coated balloon angioplasty, right popliteal artery  5.  Balloon angioplasty, right anterior tibial artery  6.  Conscious sedation, 131 minutes  7.  Closure device, Celt   Indications: This is a 66 year old gentleman with bilateral ulcers.  He is here today for further vascular evaluation.  Procedure:  The patient was identified in the holding area and taken to room 8.  The patient was then placed supine on the table and prepped and draped in the usual sterile fashion.  A time out was called.  Conscious sedation was administered with the use of IV fentanyl and Versed under continuous physician and nurse monitoring.  Heart rate, blood pressure, and oxygen saturation were continuously monitored.  Total sedation time was .  Ultrasound was used to evaluate the left common femoral artery.  It was patent .  A digital ultrasound image was acquired.  A micropuncture needle was used to access the left common femoral artery under ultrasound guidance.  An 018 wire was advanced without resistance and a micropuncture sheath was placed.  The 018 wire was removed and a benson wire was placed.  The micropuncture sheath was exchanged for a 5 french sheath.  An omniflush catheter was advanced over the wire to the level of L-1.  An abdominal angiogram was obtained.  Next, using the omniflush catheter and a benson wire, the aortic bifurcation was crossed and the catheter was placed into theright external iliac artery and right runoff was obtained.  left runoff was performed via retrograde sheath injections.  Findings:   Aortogram: No significant renal artery  stenosis.  The infrarenal abdominal aorta is widely patent without stenosis.  Bilateral common and external iliac arteries are widely patent without stenosis.  Right Lower Extremity: The right common femoral profundofemoral, and superficial femoral arteries are widely patent.  The popliteal artery occludes just above the joint space.  There is reconstitution of the distal below-knee popliteal artery.  The anterior tibial artery is the dominant runoff across the ankle.  The tibioperoneal trunk reconstitutes.  The peroneal artery is diseased but patent down to the ankle.  The posterior tibial artery is occluded.  There is diffuse disease out onto the foot  Left Lower Extremity: The left common femoral, profundofemoral, and superficial femoral artery are widely patent.  Popliteal artery is patent but heavily calcified with a stenosis at its distal extent.  The anterior tibial artery is patent down to the ankle.  The tibioperoneal trunk is patent with peroneal runoff.  The posterior tibial artery is occluded proximally but reconstitutes just beyond its origin.  There is diffuse disease out onto the foot  Intervention: After the above images were acquired the decision was made to proceed with intervention.  A 6 French 55 cm sheath was advanced into the right superficial femoral artery and the patient was fully heparinized.  I first attempted to use a 3 mm Sterling balloon with the support of a V-18 wire to cross the popliteal occlusion however this was unsuccessful and so I switched out to a 035 Glidewire and a quick cross.  I was able to successfully cross the popliteal occlusion and get reentry with wire access into the anterior tibial artery  which was confirmed with a contrast injection.  I then placed a V-18 wire into the anterior tibial artery and perform balloon angioplasty of the anterior tibial artery and popliteal artery with a 3 mm Sterling balloon.  This revealed a good result in the anterior tibial artery  however the popliteal was still diffusely diseased and so I inserted a 4 x 40 drug-coated Ranger balloon and performed balloon angioplasty for 3 minutes.  I was still unsatisfied and so I upsized to a 5 mm Mustang balloon and repeated balloon angioplasty in the popliteal artery.  Follow-up imaging showed that there was still some narrowing however the result was significantly improved and I felt that additional intervention would be problematic and so I elected to leave it here.  I then performed multiple balloon angioplasties of the anterior tibial artery down to the ankle.  I was able to get a wire out across the ankle however I could not get a balloon to track out that far.  I even switched out to an 014 system and tried a 1.5 coyote balloon but could not this get to track out.  Ultimately elected to stop as his blood flow is significantly improved.  I closed the groin with a Celt.  Impression:  #1  Right popliteal artery occlusion successfully crossed and treated using a drug-coated balloon and a 5 mm balloon in the popliteal artery with tibial angioplasty of the anterior tibial artery using a 3 mm balloon.  The patient has diffuse disease out onto the foot.  I feel that his blood flow is optimized at this point on the right leg  #2  Similar but not as severe disease pattern on the left with a distal stenosis in the below-knee popliteal artery and two-vessel runoff via the anterior tibial and peroneal artery down to the ankle.  The posterior tibial artery does reconstitute.  There is diffuse disease out onto the foot beginning at the ankle.  #3  The patient would benefit from a repeat angiography via a right femoral approach with plans for intervention on the left leg.    Juleen China, M.D., [facility] [place] Office: [phone] Pager:  [phone]

## 2022-02-05 ENCOUNTER — Encounter (HOSPITAL_COMMUNITY): Payer: Self-pay | Admitting: Surgery

## 2022-02-06 ENCOUNTER — Telehealth: Payer: Self-pay

## 2022-02-06 NOTE — Telephone Encounter (Signed)
Pt's wife called stating that the pt had surgery yesterday in the cath lab and had some concerns. She didn't specify, just asked for a callback to him and gave his mobile #.  Pt called approx 5 min later. Pt stated that he had a bruise that he was concerned about.  Reviewed pt's chart, returned pt's call, two identifiers used. Pt stated that the bruise had spread a few inches since last HS. He originally had bleeding that the provider told him how to hold pressure and for the length of time. He had to hold the area for 20 min and 30 min to achieve hemostasis last HS. Pt had no further bleeding, no pain, no discoloration, and no coldness. Reassured him that bruising would be normal and become larger given that he had bleeding after he came home. He denied any hard areas or lumps under the skin. He denied any swelling. He was instructed to monitor the area and report any worsening symptoms as discussed. Pt confirmed understanding.

## 2022-02-07 ENCOUNTER — Encounter: Payer: Self-pay | Admitting: Surgery

## 2022-02-07 ENCOUNTER — Ambulatory Visit (HOSPITAL_COMMUNITY)
Admission: RE | Admit: 2022-02-07 | Discharge: 2022-02-07 | Disposition: A | Discharge status: Home / Self Care | Payer: 59 | Source: Ambulatory Visit | Attending: Surgery | Admitting: Surgery

## 2022-02-07 ENCOUNTER — Telehealth: Payer: Self-pay

## 2022-02-07 ENCOUNTER — Other Ambulatory Visit: Payer: Self-pay

## 2022-02-07 ENCOUNTER — Ambulatory Visit (INDEPENDENT_AMBULATORY_CARE_PROVIDER_SITE_OTHER): Payer: 59 | Admitting: Surgery

## 2022-02-07 VITALS — BP 175/79 | HR 71 | Temp 97.8°F | Resp 18 | Ht 70.0 in | Wt 219.0 lb

## 2022-02-07 DIAGNOSIS — T81718A Complication of other artery following a procedure, not elsewhere classified, initial encounter: Secondary | ICD-10-CM | POA: Diagnosis present

## 2022-02-07 DIAGNOSIS — I7025 Atherosclerosis of native arteries of other extremities with ulceration: Secondary | ICD-10-CM

## 2022-02-07 DIAGNOSIS — I729 Aneurysm of unspecified site: Secondary | ICD-10-CM | POA: Diagnosis present

## 2022-02-07 MED ORDER — SULFAMETHOXAZOLE-TRIMETHOPRIM 800-160 MG PO TABS: 1.0000 | ORAL_TABLET | Freq: Two times a day (BID) | ORAL | 0 refills | Status: DC

## 2022-02-07 NOTE — Progress Notes (Signed)
[facility]  Patient name: Clinton [name] MRN: [id number] DOB: 13-Mar-1956 Sex: male   REASON FOR VISIT:    Follow up  HISOTRY OF PRESENT ILLNESS:    Clinton [name] is a 67 y.o. male that I initially saw for bilateral lower extremity wounds.  He underwent angiography on 02/04/2022.  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  I was able to successfully cross this and treat the popliteal artery with a drug-coated balloon and the anterior tibial artery.  His left groin was closed with a Celt.  When he returned home, he noticed some blood on his dressing.  I spoke with him about holding pressure.  He stated that this area was soft, and was no longer oozing after holding pressure.  He comes in today regarding concerns over his groin on the left with bruising as well as a rash on his right foot and pain at the lateral fifth metatarsal head.    The patient underwent right-sided carotid endarterectomy on 09/05/2015 for asymptomatic stenosis by [name].  His last ultrasound showed no recurrence with no significant bilateral stenosis.  He is also undergone evaluation for a aneurysm.  His ultrasound was negative for aortic enlargement.  He takes a statin for hypercholesterolemia.  He is medically managed for hypertension.   PAST MEDICAL HISTORY:   Past Medical History:  Diagnosis Date   Carotid artery occlusion    Cellulitis    Erectile dysfunction    Hyperlipidemia    Hypertension      FAMILY HISTORY:   Family History  Problem Relation Age of Onset   Other Mother        circulatory problems   Heart attack Mother    Deep vein thrombosis Mother    Heart disease Mother    Hyperlipidemia Father    Hypertension Father    Diabetes Father    Deep vein thrombosis Father    Heart disease Father        before age 73   Heart attack Father    Peripheral vascular  disease Father    Hypertension Brother    Hyperlipidemia Brother     SOCIAL HISTORY:   Social History   Tobacco Use   Smoking status: Never   Smokeless tobacco: Former    Types: Chew    Quit date: 01/01/2015  Substance Use Topics   Alcohol use: Yes    Alcohol/week: 7.0 standard drinks of alcohol    Types: 7 Cans of beer per week    Comment: pt states that he use to have a beer occasionly but has not had one in about one month     ALLERGIES:   Allergies  Allergen Reactions   Penicillins Rash    Has patient had a PCN reaction causing immediate rash, facial/tongue/throat swelling, SOB or lightheadedness with hypotension: Yes Has patient had a PCN reaction causing severe rash involving mucus membranes or skin necrosis: No Has patient had a PCN reaction that required hospitalization No Has patient had a PCN reaction occurring within the last 10 years: No If all of the above answers are "NO", then may proceed with Cephalosporin use.      CURRENT MEDICATIONS:   Current Outpatient Medications  Medication Sig Dispense Refill   amLODipine (NORVASC) 2.5 MG tablet Take 2.5 mg by mouth daily.     aspirin 81 MG tablet Take 81 mg by mouth daily.  atorvastatin (LIPITOR) 40 MG tablet      clopidogrel (PLAVIX) 75 MG tablet Take 1 tablet (75 mg total) by mouth daily. 30 tablet 11   irbesartan-hydrochlorothiazide (AVALIDE) 300-12.5 MG tablet Take 1 tablet by mouth daily.     JUBLIA 10 % SOLN APPLY TO THE AFFECTED AREA DAILY  3   sulfamethoxazole-trimethoprim (BACTRIM DS) 800-160 MG tablet Take 1 tablet by mouth 2 (two) times daily. 28 tablet 0   terbinafine (LAMISIL) 250 MG tablet      gentamicin cream (GARAMYCIN) 0.1 % Apply 1 application topically 2 (two) times daily. 15 g 1   ibuprofen (ADVIL) 800 MG tablet Take 1 tablet (800 mg total) by mouth 3 (three) times daily. (Patient not taking: Reported on 02/03/2022) 90 tablet 1   lisinopril-hydrochlorothiazide (ZESTORETIC) 20-12.5 MG  tablet Take 1 tablet by mouth daily. (Patient not taking: Reported on 02/03/2022)     meloxicam (MOBIC) 15 MG tablet Take 1 tablet (15 mg total) by mouth daily. (Patient not taking: Reported on 02/03/2022) 30 tablet 1   methylPREDNISolone (MEDROL DOSEPAK) 4 MG TBPK tablet TAKE 6 TABLETS ON DAY 1 AS DIRECTED ON PACKAGE AND DECREASE BY 1 TAB EACH DAY FOR A TOTAL OF 6 DAYS (Patient not taking: Reported on 02/07/2022) 21 each 0   oxyCODONE-acetaminophen (PERCOCET) 5-325 MG tablet Take 1 tablet by mouth every 4 (four) hours as needed for severe pain. (Patient not taking: Reported on 02/03/2022) 30 tablet 0   sildenafil (VIAGRA) 100 MG tablet 1 tablet as needed (Patient not taking: Reported on 02/03/2022)     No current facility-administered medications for this visit.    REVIEW OF SYSTEMS:   [X]  denotes positive finding, [ ]  denotes negative finding Cardiac  Comments:  Chest pain or chest pressure:    Shortness of breath upon exertion:    Short of breath when lying flat:    Irregular heart rhythm:        Vascular    Pain in calf, thigh, or hip brought on by ambulation:    Pain in feet at night that wakes you up from your sleep:     Blood clot in your veins:    Leg swelling:         Pulmonary    Oxygen at home:    Productive cough:     Wheezing:         Neurologic    Sudden weakness in arms or legs:     Sudden numbness in arms or legs:     Sudden onset of difficulty speaking or slurred speech:    Temporary loss of vision in one eye:     Problems with dizziness:         Gastrointestinal    Blood in stool:     Vomited blood:         Genitourinary    Burning when urinating:     Blood in urine:        Psychiatric    Major depression:         Hematologic    Bleeding problems:    Problems with blood clotting too easily:        Skin    Rashes or ulcers:        Constitutional    Fever or chills:      PHYSICAL EXAM:   Vitals:   02/07/22 1233  BP: (!) 175/79  Pulse: 71   Resp: 18  Temp: 97.8 F (36.6 C)  TempSrc: Oral  SpO2: 100%  Weight: 219 lb (99.3 kg)  Height: 5' 10" (1.778 m)    GENERAL: The patient is a well-nourished male, in no acute distress. The vital signs are documented above. CARDIAC: There is a regular rate and rhythm.  VASCULAR: Ecchymosis surrounding left groin cannulation site.  This area is very soft and nontender PULMONARY: Non-labored respirations ABDOMEN: Soft and non-tender with normal pitched bowel sounds.  MUSCULOSKELETAL: There are no major deformities or cyanosis. NEUROLOGIC: No focal weakness or paresthesias are detected. SKIN: Punctate rash on the dorsum of the left foot. PSYCHIATRIC: The patient has a normal affect.  STUDIES:   I have reviewed his ultrasound the following findings: No evidence of left groin pseudoaneurysm or hematoma.  Right distal PTA 15 cm/s  Right distal ATA 147 cm/s  Right peroneal artery not visualized.     MEDICAL ISSUES:   I reassured the patient today that he does not have a pseudoaneurysm or any issues concerning in the left groin other than ecchymosis.  He continues to have persistent pain at his fifth metatarsal head where he has an open wound.  There is some drainage the area is tender to the touch.  I told him that with an open wound this will likely continue to cause pain until it heals.  If he can no longer tolerate the pain we could offer him amputation but I do not think that is necessary at this time.  I am going to put him on 2 weeks of Bactrim in case there is some smoldering infection.  Have also encouraged him to return to podiatry for further evaluation.  He is scheduled to have the left leg intervened on on July 11    Wells Marcas Bowsher, IV, MD, FACS [facility] [place] Tel [phone] Pager [phone]  

## 2022-02-07 NOTE — Telephone Encounter (Signed)
Pt's wife, [name], called stating that the pt was still having a lot of pain, swelling, the bruise is moving up his side, and he has a rash on the top of his L foot.  Reviewed pt's chart, returned wife's call for clarification, two identifiers used. She stated that the pt has a lot of pain in his foot when walking especially, his foot and leg are still swollen, and the rash is itchy, red, and splotchy x 2 days. They have been using cortisone cream for the rash which has helped, but it's still very itchy after he takes his compression stockings off at HS. He has been taking extra strength Tylenol for the pain. She feels that the sore on the bottom of his L foot has remained the same. Pt and wife are very scared and want to be seen.  Spoke with Dr Myra Gianotti who advised that the pt come to the office and he would see him. He wanted an [place] of his L groin to r/o a pseudoaneurysm. Spoke with Alcario Drought in the lab and Janann August was able to take the pt in her lunch break. Got both appts scheduled for pt.  Called wife and informed her of the appts and to be here early. Wife was extremely grateful. Confirmed understanding.

## 2022-02-07 NOTE — H&P (View-Only) (Signed)
Vascular and Vein Specialist of [facility]  Patient name: Clinton [name] MRN: [id number] DOB: 13-Mar-1956 Sex: male   REASON FOR VISIT:    Follow up  HISOTRY OF PRESENT ILLNESS:    Clinton [name] is a 67 y.o. male that I initially saw for bilateral lower extremity wounds.  He underwent angiography on 02/04/2022.  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  I was able to successfully cross this and treat the popliteal artery with a drug-coated balloon and the anterior tibial artery.  His left groin was closed with a Celt.  When he returned home, he noticed some blood on his dressing.  I spoke with him about holding pressure.  He stated that this area was soft, and was no longer oozing after holding pressure.  He comes in today regarding concerns over his groin on the left with bruising as well as a rash on his right foot and pain at the lateral fifth metatarsal head.    The patient underwent right-sided carotid endarterectomy on 09/05/2015 for asymptomatic stenosis by [name].  His last ultrasound showed no recurrence with no significant bilateral stenosis.  He is also undergone evaluation for a aneurysm.  His ultrasound was negative for aortic enlargement.  He takes a statin for hypercholesterolemia.  He is medically managed for hypertension.   PAST MEDICAL HISTORY:   Past Medical History:  Diagnosis Date   Carotid artery occlusion    Cellulitis    Erectile dysfunction    Hyperlipidemia    Hypertension      FAMILY HISTORY:   Family History  Problem Relation Age of Onset   Other Mother        circulatory problems   Heart attack Mother    Deep vein thrombosis Mother    Heart disease Mother    Hyperlipidemia Father    Hypertension Father    Diabetes Father    Deep vein thrombosis Father    Heart disease Father        before age 73   Heart attack Father    Peripheral vascular  disease Father    Hypertension Brother    Hyperlipidemia Brother     SOCIAL HISTORY:   Social History   Tobacco Use   Smoking status: Never   Smokeless tobacco: Former    Types: Chew    Quit date: 01/01/2015  Substance Use Topics   Alcohol use: Yes    Alcohol/week: 7.0 standard drinks of alcohol    Types: 7 Cans of beer per week    Comment: pt states that he use to have a beer occasionly but has not had one in about one month     ALLERGIES:   Allergies  Allergen Reactions   Penicillins Rash    Has patient had a PCN reaction causing immediate rash, facial/tongue/throat swelling, SOB or lightheadedness with hypotension: Yes Has patient had a PCN reaction causing severe rash involving mucus membranes or skin necrosis: No Has patient had a PCN reaction that required hospitalization No Has patient had a PCN reaction occurring within the last 10 years: No If all of the above answers are "NO", then may proceed with Cephalosporin use.      CURRENT MEDICATIONS:   Current Outpatient Medications  Medication Sig Dispense Refill   amLODipine (NORVASC) 2.5 MG tablet Take 2.5 mg by mouth daily.     aspirin 81 MG tablet Take 81 mg by mouth daily.  atorvastatin (LIPITOR) 40 MG tablet      clopidogrel (PLAVIX) 75 MG tablet Take 1 tablet (75 mg total) by mouth daily. 30 tablet 11   irbesartan-hydrochlorothiazide (AVALIDE) 300-12.5 MG tablet Take 1 tablet by mouth daily.     JUBLIA 10 % SOLN APPLY TO THE AFFECTED AREA DAILY  3   sulfamethoxazole-trimethoprim (BACTRIM DS) 800-160 MG tablet Take 1 tablet by mouth 2 (two) times daily. 28 tablet 0   terbinafine (LAMISIL) 250 MG tablet      gentamicin cream (GARAMYCIN) 0.1 % Apply 1 application topically 2 (two) times daily. 15 g 1   ibuprofen (ADVIL) 800 MG tablet Take 1 tablet (800 mg total) by mouth 3 (three) times daily. (Patient not taking: Reported on 02/03/2022) 90 tablet 1   lisinopril-hydrochlorothiazide (ZESTORETIC) 20-12.5 MG  tablet Take 1 tablet by mouth daily. (Patient not taking: Reported on 02/03/2022)     meloxicam (MOBIC) 15 MG tablet Take 1 tablet (15 mg total) by mouth daily. (Patient not taking: Reported on 02/03/2022) 30 tablet 1   methylPREDNISolone (MEDROL DOSEPAK) 4 MG TBPK tablet TAKE 6 TABLETS ON DAY 1 AS DIRECTED ON PACKAGE AND DECREASE BY 1 TAB EACH DAY FOR A TOTAL OF 6 DAYS (Patient not taking: Reported on 02/07/2022) 21 each 0   oxyCODONE-acetaminophen (PERCOCET) 5-325 MG tablet Take 1 tablet by mouth every 4 (four) hours as needed for severe pain. (Patient not taking: Reported on 02/03/2022) 30 tablet 0   sildenafil (VIAGRA) 100 MG tablet 1 tablet as needed (Patient not taking: Reported on 02/03/2022)     No current facility-administered medications for this visit.    REVIEW OF SYSTEMS:   [X]  denotes positive finding, [ ]  denotes negative finding Cardiac  Comments:  Chest pain or chest pressure:    Shortness of breath upon exertion:    Short of breath when lying flat:    Irregular heart rhythm:        Vascular    Pain in calf, thigh, or hip brought on by ambulation:    Pain in feet at night that wakes you up from your sleep:     Blood clot in your veins:    Leg swelling:         Pulmonary    Oxygen at home:    Productive cough:     Wheezing:         Neurologic    Sudden weakness in arms or legs:     Sudden numbness in arms or legs:     Sudden onset of difficulty speaking or slurred speech:    Temporary loss of vision in one eye:     Problems with dizziness:         Gastrointestinal    Blood in stool:     Vomited blood:         Genitourinary    Burning when urinating:     Blood in urine:        Psychiatric    Major depression:         Hematologic    Bleeding problems:    Problems with blood clotting too easily:        Skin    Rashes or ulcers:        Constitutional    Fever or chills:      PHYSICAL EXAM:   Vitals:   02/07/22 1233  BP: (!) 175/79  Pulse: 71   Resp: 18  Temp: 97.8 F (36.6 C)  TempSrc: Oral  SpO2: 100%  Weight: 219 lb (99.3 kg)  Height: 5\' 10"  (1.778 m)    GENERAL: The patient is a well-nourished male, in no acute distress. The vital signs are documented above. CARDIAC: There is a regular rate and rhythm.  VASCULAR: Ecchymosis surrounding left groin cannulation site.  This area is very soft and nontender PULMONARY: Non-labored respirations ABDOMEN: Soft and non-tender with normal pitched bowel sounds.  MUSCULOSKELETAL: There are no major deformities or cyanosis. NEUROLOGIC: No focal weakness or paresthesias are detected. SKIN: Punctate rash on the dorsum of the left foot. PSYCHIATRIC: The patient has a normal affect.  STUDIES:   I have reviewed his ultrasound the following findings: No evidence of left groin pseudoaneurysm or hematoma.  Right distal PTA 15 cm/s  Right distal ATA 147 cm/s  Right peroneal artery not visualized.     MEDICAL ISSUES:   I reassured the patient today that he does not have a pseudoaneurysm or any issues concerning in the left groin other than ecchymosis.  He continues to have persistent pain at his fifth metatarsal head where he has an open wound.  There is some drainage the area is tender to the touch.  I told him that with an open wound this will likely continue to cause pain until it heals.  If he can no longer tolerate the pain we could offer him amputation but I do not think that is necessary at this time.  I am going to put him on 2 weeks of Bactrim in case there is some smoldering infection.  Have also encouraged him to return to podiatry for further evaluation.  He is scheduled to have the left leg intervened on on July 11    July 13, MD, FACS [facility] and Vein Specialists of [facility] [phone] Pager [phone]

## 2022-02-10 ENCOUNTER — Telehealth: Payer: Self-pay

## 2022-02-10 NOTE — Telephone Encounter (Signed)
Pt's wife, [name], called stating that the pt's right leg had a lot of swelling and he was having a lot of pain on the right side of his foot. Asked to call the pt.  Reviewed pt's chart, returned pt's call for clarification, two identifiers used. Pt stated that his right foot was badly swollen and having a great deal of pain. He denies any swelling or pain in his right leg. He has been elevating at HS and often during the weekend. The pt was brought in on 6/30 and cleared of any pseudoaneurysm. Notated that he should f/u with podiatry for any foot issues. Instructed pt to call his podiatrist for evaluation. Pt confirmed understanding.

## 2022-02-18 ENCOUNTER — Ambulatory Visit (HOSPITAL_COMMUNITY)
Admission: RE | Admit: 2022-02-18 | Discharge: 2022-02-18 | Disposition: A | Discharge status: Home / Self Care | Payer: 59 | Attending: Surgery | Admitting: Surgery

## 2022-02-18 ENCOUNTER — Other Ambulatory Visit: Payer: Self-pay

## 2022-02-18 ENCOUNTER — Encounter (HOSPITAL_COMMUNITY)
Admission: RE | Disposition: A | Discharge status: Home / Self Care | Payer: Self-pay | Source: Home / Self Care | Attending: Surgery

## 2022-02-18 DIAGNOSIS — I70245 Atherosclerosis of native arteries of left leg with ulceration of other part of foot: Secondary | ICD-10-CM | POA: Insufficient documentation

## 2022-02-18 DIAGNOSIS — I7025 Atherosclerosis of native arteries of other extremities with ulceration: Secondary | ICD-10-CM

## 2022-02-18 DIAGNOSIS — L97529 Non-pressure chronic ulcer of other part of left foot with unspecified severity: Secondary | ICD-10-CM | POA: Diagnosis not present

## 2022-02-18 DIAGNOSIS — E7800 Pure hypercholesterolemia, unspecified: Secondary | ICD-10-CM | POA: Diagnosis not present

## 2022-02-18 DIAGNOSIS — I70249 Atherosclerosis of native arteries of left leg with ulceration of unspecified site: Secondary | ICD-10-CM | POA: Diagnosis not present

## 2022-02-18 DIAGNOSIS — I10 Essential (primary) hypertension: Secondary | ICD-10-CM | POA: Diagnosis not present

## 2022-02-18 DIAGNOSIS — I70239 Atherosclerosis of native arteries of right leg with ulceration of unspecified site: Secondary | ICD-10-CM | POA: Diagnosis not present

## 2022-02-18 HISTORY — PX: ABDOMINAL AORTOGRAM W/LOWER EXTREMITY: CATH118223

## 2022-02-18 HISTORY — PX: PERIPHERAL VASCULAR ATHERECTOMY: CATH118256

## 2022-02-18 LAB — POCT ACTIVATED CLOTTING TIME
Activated Clotting Time: 251 seconds
Activated Clotting Time: 227 seconds

## 2022-02-18 LAB — POCT I-STAT, CHEM 8
BUN: 25 mg/dL — ABNORMAL HIGH (ref 8–23)
BUN: 18 mg/dL (ref 8–23)
Calcium, Ion: 1.19 mmol/L (ref 1.15–1.40)
Calcium, Ion: 1.28 mmol/L (ref 1.15–1.40)
Chloride: 103 mmol/L (ref 98–111)
Chloride: 102 mmol/L (ref 98–111)
Creatinine, Ser: 1.7 mg/dL — ABNORMAL HIGH (ref 0.61–1.24)
Creatinine, Ser: 1.6 mg/dL — ABNORMAL HIGH (ref 0.61–1.24)
Glucose, Bld: 105 mg/dL — ABNORMAL HIGH (ref 70–99)
Glucose, Bld: 98 mg/dL (ref 70–99)
HCT: 35 % — ABNORMAL LOW (ref 39.0–52.0)
HCT: 35 % — ABNORMAL LOW (ref 39.0–52.0)
Hemoglobin: 11.9 g/dL — ABNORMAL LOW (ref 13.0–17.0)
Hemoglobin: 11.9 g/dL — ABNORMAL LOW (ref 13.0–17.0)
Potassium: 5.7 mmol/L — ABNORMAL HIGH (ref 3.5–5.1)
Potassium: 4.8 mmol/L (ref 3.5–5.1)
Sodium: 136 mmol/L (ref 135–145)
Sodium: 138 mmol/L (ref 135–145)
TCO2: 28 mmol/L (ref 22–32)
TCO2: 25 mmol/L (ref 22–32)

## 2022-02-18 SURGERY — ABDOMINAL AORTOGRAM W/LOWER EXTREMITY
Anesthesia: LOCAL

## 2022-02-18 MED ORDER — ASPIRIN 81 MG PO TBEC: 81.0000 mg | DELAYED_RELEASE_TABLET | Freq: Every day | ORAL | Status: DC

## 2022-02-18 MED ORDER — LIDOCAINE HCL (PF) 1 % IJ SOLN
INTRAMUSCULAR | Status: DC | PRN
  Administered 2022-02-18: 15 mL

## 2022-02-18 MED ORDER — HEPARIN SODIUM (PORCINE) 1000 UNIT/ML IJ SOLN
INTRAMUSCULAR | Status: AC
  Filled 2022-02-18: qty 10

## 2022-02-18 MED ORDER — LIDOCAINE HCL (PF) 1 % IJ SOLN
INTRAMUSCULAR | Status: AC
  Filled 2022-02-18: qty 30

## 2022-02-18 MED ORDER — HYDRALAZINE HCL 20 MG/ML IJ SOLN: 5.0000 mg | INTRAMUSCULAR | Status: DC | PRN

## 2022-02-18 MED ORDER — MIDAZOLAM HCL 2 MG/2ML IJ SOLN
INTRAMUSCULAR | Status: AC
  Filled 2022-02-18: qty 2

## 2022-02-18 MED ORDER — VERAPAMIL HCL 2.5 MG/ML IV SOLN
INTRAVENOUS | Status: AC
  Filled 2022-02-18: qty 2

## 2022-02-18 MED ORDER — FENTANYL CITRATE (PF) 100 MCG/2ML IJ SOLN
INTRAMUSCULAR | Status: AC
  Filled 2022-02-18: qty 2

## 2022-02-18 MED ORDER — OXYCODONE HCL 5 MG PO TABS: 5.0000 mg | ORAL_TABLET | ORAL | Status: DC | PRN

## 2022-02-18 MED ORDER — SODIUM CHLORIDE 0.9% FLUSH: 3.0000 mL | INTRAVENOUS | Status: DC | PRN

## 2022-02-18 MED ORDER — SODIUM CHLORIDE 0.9% FLUSH: 3.0000 mL | Freq: Two times a day (BID) | INTRAVENOUS | Status: DC

## 2022-02-18 MED ORDER — FENTANYL CITRATE (PF) 100 MCG/2ML IJ SOLN
INTRAMUSCULAR | Status: DC | PRN
  Administered 2022-02-18 (×2): 25 ug via INTRAVENOUS
  Administered 2022-02-18: 50 ug via INTRAVENOUS
  Administered 2022-02-18: 25 ug via INTRAVENOUS

## 2022-02-18 MED ORDER — HEPARIN SODIUM (PORCINE) 1000 UNIT/ML IJ SOLN
INTRAMUSCULAR | Status: DC | PRN
  Administered 2022-02-18: 2000 [IU] via INTRAVENOUS
  Administered 2022-02-18: 10000 [IU] via INTRAVENOUS

## 2022-02-18 MED ORDER — SODIUM CHLORIDE 0.9 % IV SOLN: INTRAVENOUS | Status: DC

## 2022-02-18 MED ORDER — CLOPIDOGREL BISULFATE 75 MG PO TABS: 75.0000 mg | ORAL_TABLET | Freq: Every day | ORAL | Status: DC

## 2022-02-18 MED ORDER — ONDANSETRON HCL 4 MG/2ML IJ SOLN: 4.0000 mg | Freq: Four times a day (QID) | INTRAMUSCULAR | Status: DC | PRN

## 2022-02-18 MED ORDER — HEPARIN (PORCINE) IN NACL 1000-0.9 UT/500ML-% IV SOLN
INTRAVENOUS | Status: DC | PRN
  Administered 2022-02-18 (×2): 500 mL

## 2022-02-18 MED ORDER — MIDAZOLAM HCL 2 MG/2ML IJ SOLN
INTRAMUSCULAR | Status: DC | PRN
  Administered 2022-02-18: 1 mg via INTRAVENOUS
  Administered 2022-02-18: 2 mg via INTRAVENOUS
  Administered 2022-02-18 (×2): 1 mg via INTRAVENOUS

## 2022-02-18 MED ORDER — VIPERSLIDE LUBRICANT OPTIME
TOPICAL | Status: DC | PRN
  Administered 2022-02-18: 250 mL via SURGICAL_CAVITY

## 2022-02-18 MED ORDER — SODIUM CHLORIDE 0.9 % IV SOLN: 250.0000 mL | INTRAVENOUS | Status: DC | PRN

## 2022-02-18 MED ORDER — SODIUM CHLORIDE 0.9 % WEIGHT BASED INFUSION
1.0000 mL/kg/h | INTRAVENOUS | Status: DC
Start: 2022-02-18 — End: 2022-02-18

## 2022-02-18 MED ORDER — NITROGLYCERIN IN D5W 200-5 MCG/ML-% IV SOLN
INTRAVENOUS | Status: AC
  Filled 2022-02-18: qty 250

## 2022-02-18 MED ORDER — HEPARIN (PORCINE) IN NACL 1000-0.9 UT/500ML-% IV SOLN
INTRAVENOUS | Status: AC
  Filled 2022-02-18: qty 1000

## 2022-02-18 MED ORDER — LABETALOL HCL 5 MG/ML IV SOLN: 10.0000 mg | INTRAVENOUS | Status: DC | PRN

## 2022-02-18 MED ORDER — MORPHINE SULFATE (PF) 2 MG/ML IV SOLN: 2.0000 mg | INTRAVENOUS | Status: DC | PRN

## 2022-02-18 MED ORDER — ACETAMINOPHEN 325 MG PO TABS: 650.0000 mg | ORAL_TABLET | ORAL | Status: DC | PRN

## 2022-02-18 SURGICAL SUPPLY — 32 items
BALLN STERLING OTW 3X100X150 (BALLOONS) ×3
BALLN STERLING OTW 3X60X150 (BALLOONS) ×3
BALLN STERLING OTW 4X60X135 (BALLOONS) ×3
BALLN STERLING SL OTW 3X80X150 (BALLOONS) ×3
BALLOON STERLING OTW 3X100X150 (BALLOONS) IMPLANT
BALLOON STERLING OTW 3X60X150 (BALLOONS) IMPLANT
BALLOON STERLING OTW 4X60X135 (BALLOONS) IMPLANT
BALLOON STRLNG SL OTW 3X80X150 (BALLOONS) IMPLANT
CATH NAVICROSS ST .035X135CM (MICROCATHETER) ×1 IMPLANT
CATH OMNI FLUSH 5F 65CM (CATHETERS) ×1 IMPLANT
CATH QUICKCROSS .035X135CM (MICROCATHETER) ×1 IMPLANT
DCB RANGER 4.0X80 135 (BALLOONS) IMPLANT
DEVICE TORQUE H2O (MISCELLANEOUS) ×1 IMPLANT
DEVICE VASC CLSR CELT ART 6 (Vascular Products) ×1 IMPLANT
DIAMONDBACK CLASSIC OAS 1.5MM (CATHETERS) ×3
FEM STOP ARCH (HEMOSTASIS) ×3
GUIDEWIRE ANGLED .035X260CM (WIRE) ×1 IMPLANT
KIT ENCORE 26 ADVANTAGE (KITS) ×1 IMPLANT
KIT MICROPUNCTURE NIT STIFF (SHEATH) ×1 IMPLANT
KIT PV (KITS) ×4 IMPLANT
RANGER DCB 4.0X80 135 (BALLOONS) ×3
SHEATH PINNACLE 5F 10CM (SHEATH) ×1 IMPLANT
SHEATH PINNACLE 6F 10CM (SHEATH) ×1 IMPLANT
SHEATH PINNACLE ST 6F 45CM (SHEATH) ×1 IMPLANT
SYR MEDRAD MARK V 150ML (SYRINGE) ×1 IMPLANT
SYSTEM COMPRESSION FEMOSTOP (HEMOSTASIS) IMPLANT
SYSTEM DIMNDBCK CLSC OAS 1.5MM (CATHETERS) IMPLANT
TRANSDUCER W/STOPCOCK (MISCELLANEOUS) ×4 IMPLANT
TRAY PV CATH (CUSTOM PROCEDURE TRAY) ×4 IMPLANT
WIRE BENTSON .035X145CM (WIRE) ×1 IMPLANT
WIRE G V18X300CM (WIRE) ×2 IMPLANT
WIRE VIPER WIRECTO 0.014 (WIRE) ×1 IMPLANT

## 2022-02-18 NOTE — Interval H&P Note (Signed)
History and Physical Interval Note:  02/18/2022 10:30 AM  Clinton [name]  has presented today for surgery, with the diagnosis of bilateral lower extremity ulcers.  The various methods of treatment have been discussed with the patient and family. After consideration of risks, benefits and other options for treatment, the patient has consented to  Procedure(s): ABDOMINAL AORTOGRAM W/LOWER EXTREMITY (N/A) as a surgical intervention.  The patient's history has been reviewed, patient examined, no change in status, stable for surgery.  I have reviewed the patient's chart and labs.  Questions were answered to the patient's satisfaction.     Durene Cal

## 2022-02-18 NOTE — Progress Notes (Addendum)
1500 Dr Myra Gianotti assessed site, he said  bedrest 4 hours, leave fem stop on x 1 hour, then assess, call Dr Myra Gianotti or Dr Randie Heinz if needed. Dr Myra Gianotti said not to give plavix and asa here he will take at home. 1600 cath lab will come and assess and help to adjust femstop. Pt is still oozing.  1620 Veronica RN cath lab came and removed femstop and currently holding pressure for 20 minutes. 1640 hemostasis, slight pressure dressing applied. Continue to monitor.

## 2022-02-18 NOTE — Progress Notes (Signed)
On arrival; to PSS pt had small amount of blood on dressing right groin, pressure starting at 1305. Dr Myra Gianotti in case but have contacted. Continue holding pressure by Ileene Rubens RN, I held 40 minutes, dressing was removed at initial start. Pt voided. Continue to hold.

## 2022-02-18 NOTE — Progress Notes (Signed)
Called to short stay for oozing right groin site. Per Dr.Brabham, femostop placed on right groin. Positioned several times and adjusted to .   Right dp pulse present with doppler.

## 2022-02-18 NOTE — Progress Notes (Signed)
Victorino Dike, RN  notified of I-Stat results and order noted

## 2022-02-18 NOTE — Op Note (Signed)
Patient name: Clinton [name] MRN: [id number] DOB: 1956/04/05 Sex: male  02/18/2022 Pre-operative Diagnosis: Left leg ulcer Post-operative diagnosis:  Same Surgeon:  Durene Cal Procedure Performed:  1.  Ultrasound-guided access, right femoral artery  2.  Left lower extremity runoff  3.  Drug-coated balloon angioplasty, left popliteal artery  4.  Angioplasty, left peroneal artery  5.  Mechanical atherectomy, left popliteal and peroneal artery  6.  Conscious sedation, 95 minutes  7.  Closure device, Celt   Indications: This is a 66 year old gentleman with bilateral lower extremity wounds who is here today for angiographic evaluation.  He has previously undergone treatment of the right leg.  Wi-Fi score is W1, I3F0  Procedure:  The patient was identified in the holding area and taken to room 8.  The patient was then placed supine on the table and prepped and draped in the usual sterile fashion.  A time out was called.  Conscious sedation was administered with the use of IV fentanyl and Versed under continuous physician and nurse monitoring.  Heart rate, blood pressure, and oxygen saturation were continuously monitored.  Total sedation time was .  Ultrasound was used to evaluate the right common femoral artery.  It was patent .  A digital ultrasound image was acquired.  A micropuncture needle was used to access the right common femoral artery under ultrasound guidance.  An 018 wire was advanced without resistance and a micropuncture sheath was placed.  The 018 wire was removed and a benson wire was placed.  The micropuncture sheath was exchanged for a 5 french sheath.  An omniflush was used to cross the bifurcation and placed a Bentson wire in the superficial femoral artery.  Next a 6 French 45 cm sheath was advanced over the bifurcation.  Contrast injections were then performed. Findings:    Left Lower Extremity: Left common femoral and profundofemoral artery are widely patent.   The superficial femoral artery is widely patent but heavily calcified.  The popliteal artery is diffusely diseased particularly below the knee where it is heavily calcified with near occlusive stenosis.  There is greater than 80% stenosis within the origin of the peroneal artery which is the dominant runoff.  The anterior tibial artery is patent proximally but fades out distally.  The posterior tibial artery is occluded proximally but does reconstitute.  There is diffuse disease out onto the foot  Intervention: After the above images were acquired the decision was made to proceed with intervention.  The patient was fully heparinized.  Using a V-18 wire with the support of a 3 mm Sterling balloon catheter, I was able to get wire access into the peroneal artery.  However I could not advance the balloon.  Then switched out to a quick cross catheter and an 035 Glidewire which was ultimately able to get into the peroneal artery.  I could get the quick cross down to the distal popliteal but no further.  I then remove the Glidewire and exchanged for a V-18 wire.  I was then able to advance a 3 x 80 Sterling balloon over top of the wire and I performed primary balloon angioplasty of the peroneal, tibioperoneal trunk and popliteal artery for 2 minutes.  Completion imaging revealed improved but still suboptimal results.  I elected to treat the popliteal artery with a drug-coated 4 x 80 Ranger balloon.  Unfortunately is unable to get past the distal stenosis at the origin of the tibioperoneal trunk with the drug-coated balloon.  I did  treat the below-knee popliteal artery with the Ranger balloon for 3 minutes.  I was still unsatisfied with the calcified stenotic lesion at the distal popliteal artery.  I felt like atherectomy would be beneficial.  I switched out the V18 wire for a Viper wire and used a CSI 1.5 classic device to perform atherectomy of the below-knee popliteal artery and the peroneal artery at low medium and  high speeds.  I then proceeded to repeat balloon angioplasty of the tibioperoneal trunk and peroneal artery with a 3 mm balloon, and the popliteal artery with a 4 mm balloon.  Completion imaging showed improved results there was still residual luminal irregularity of the distal popliteal artery however this was much improved.  The patient continues to have diffuse disease on the left foot.  At this point I did not feel that any further endovascular treatment would be beneficial, given the amount of contrast and time that had already been required.  I therefore elected to stop and I closed the groin with a Celt.  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  These were treated with drug-coated balloon angioplasty to the popliteal artery and angioplasty of the tibioperoneal trunk and peroneal artery.  There was a residual stenosis at the distal popliteal and origin of the tibioperoneal trunk which required atherectomy using a CSI 1.5 classic device.  #2  The patient has diffuse disease out onto the foot.    Juleen China, M.D., [facility] V[facility] and Vein Specialists of [place] Office: [phone] Pager:  [phone]

## 2022-02-19 ENCOUNTER — Encounter (HOSPITAL_COMMUNITY): Payer: Self-pay | Admitting: Surgery

## 2022-02-20 ENCOUNTER — Telehealth: Payer: Self-pay

## 2022-02-20 NOTE — Telephone Encounter (Signed)
Pt's wife, [name], called asking if the pt needed to see a cardiologist given his recent vascular issues.  Reviewed pt's chart, returned call, two identifiers used. Informed her that given his family history, it shouldn't be a problem to get a referral from his PCP. Confirmed understanding.

## 2022-02-21 ENCOUNTER — Telehealth: Payer: Self-pay

## 2022-02-21 NOTE — Telephone Encounter (Signed)
Pt's wife called stating that the pt's upper lip swelled immediately after eating dinner last night and this morning his face was swollen. She made an appt with this PCP who recommended she call this office to ensure he didn't need to be seen here too.  Reviewed pt's chart, returned call, two identifiers used. Informed her that PCP would be the appropriate resource. Pt denied any tongue swelling or trouble breathing. Pt had not taken any Benadryl. Wife states that she used a bit more spices than usual on the cooked squash from their garden. Pt to see PCP at 11 AM

## 2022-02-24 ENCOUNTER — Other Ambulatory Visit: Payer: Self-pay | Admitting: *Deleted

## 2022-02-24 DIAGNOSIS — I7025 Atherosclerosis of native arteries of other extremities with ulceration: Secondary | ICD-10-CM

## 2022-02-25 ENCOUNTER — Ambulatory Visit: Payer: Self-pay

## 2022-02-25 NOTE — Telephone Encounter (Signed)
Pt's wife called and stated that she would like a referral for pt to a cardiologist.

## 2022-03-17 ENCOUNTER — Ambulatory Visit (INDEPENDENT_AMBULATORY_CARE_PROVIDER_SITE_OTHER)
Admission: RE | Admit: 2022-03-17 | Discharge: 2022-03-17 | Disposition: A | Discharge status: Home / Self Care | Payer: 59 | Source: Ambulatory Visit | Attending: Surgery | Admitting: Surgery

## 2022-03-17 ENCOUNTER — Encounter: Payer: Self-pay | Admitting: Surgery

## 2022-03-17 ENCOUNTER — Ambulatory Visit (INDEPENDENT_AMBULATORY_CARE_PROVIDER_SITE_OTHER): Payer: 59 | Admitting: Surgery

## 2022-03-17 ENCOUNTER — Ambulatory Visit (HOSPITAL_COMMUNITY)
Admission: RE | Admit: 2022-03-17 | Discharge: 2022-03-17 | Disposition: A | Discharge status: Home / Self Care | Payer: 59 | Source: Ambulatory Visit | Attending: Surgery | Admitting: Surgery

## 2022-03-17 VITALS — BP 163/77 | HR 65 | Temp 98.3°F | Resp 20 | Ht 69.0 in | Wt 217.0 lb

## 2022-03-17 DIAGNOSIS — I7025 Atherosclerosis of native arteries of other extremities with ulceration: Secondary | ICD-10-CM

## 2022-03-17 NOTE — Progress Notes (Signed)
[facility]  Patient name: Clinton [name] MRN: [id number] DOB: Dec 30, 1955 Sex: male   REASON FOR VISIT:    Follow up  HISOTRY OF PRESENT ILLNESS:    Clinton [name] is a 66 y.o. male with a history of bilateral lower extremity ulcers since the beginning of the summer.  On 02/04/2022 he underwent angiography.  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  He had diffuse disease out onto the foot.  On 02/18/2022 he had repeat angiography this time of the left leg.  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  These were treated with drug-coated balloon angioplasty.  There was a residual stenosis in the distal popliteal artery and origin of the tibioperoneal trunk which was treated with CSI atherectomy.  He again had diffuse disease out onto the foot.  He is back today for follow-up  The patient underwent right-sided carotid endarterectomy on 09/05/2015 for asymptomatic stenosis by [name].  His last ultrasound showed no recurrence with no significant bilateral stenosis.  He is also undergone evaluation for a aneurysm.  His ultrasound was negative for aortic enlargement.  He takes a statin for hypercholesterolemia.  He is medically managed for hypertension.   PAST MEDICAL HISTORY:   Past Medical History:  Diagnosis Date   Carotid artery occlusion    Cellulitis    Erectile dysfunction    Hyperlipidemia    Hypertension      FAMILY HISTORY:   Family History  Problem Relation Age of Onset   Other Mother        circulatory problems   Heart attack Mother    Deep vein thrombosis Mother    Heart disease Mother    Hyperlipidemia Father    Hypertension Father    Diabetes Father    Deep vein thrombosis Father     Heart disease Father        before age 67   Heart attack Father    Peripheral vascular disease Father    Hypertension Brother    Hyperlipidemia Brother     SOCIAL HISTORY:   Social History   Tobacco Use   Smoking status: Never   Smokeless tobacco: Former    Types: Chew    Quit date: 01/01/2015  Substance Use Topics   Alcohol use: Yes    Alcohol/week: 7.0 standard drinks of alcohol    Types: 7 Cans of beer per week    Comment: pt states that he use to have a beer occasionly but has not had one in about one month     ALLERGIES:   Allergies  Allergen Reactions   Penicillins Rash    Has patient had a PCN reaction causing immediate rash, facial/tongue/throat swelling, SOB or lightheadedness with hypotension: Yes Has patient had a PCN reaction causing severe rash involving mucus membranes or skin necrosis: No Has patient had a PCN reaction that required hospitalization No Has patient had a PCN reaction occurring within the last 10 years: No If all of the above answers are "NO", then may proceed with Cephalosporin use.    Sulfa Antibiotics     Other reaction(s): Unknown     CURRENT MEDICATIONS:   Current Outpatient Medications  Medication Sig Dispense Refill   acetaminophen (TYLENOL) 500 MG tablet Take 1,000 mg by mouth every 6 (  six) hours as needed for moderate pain or mild pain.     amLODipine (NORVASC) 2.5 MG tablet Take 2.5 mg by mouth at bedtime.     aspirin 81 MG tablet Take 81 mg by mouth at bedtime.     atorvastatin (LIPITOR) 40 MG tablet Take 40 mg by mouth at bedtime.     clopidogrel (PLAVIX) 75 MG tablet Take 1 tablet (75 mg total) by mouth daily. (Patient taking differently: Take 75 mg by mouth at bedtime.) 30 tablet 11   irbesartan-hydrochlorothiazide (AVALIDE) 300-12.5 MG tablet Take 1 tablet by mouth at bedtime.     Multiple Vitamin (MULTIVITAMIN) capsule Take 2 capsules by mouth at bedtime. Gummy     mupirocin ointment (BACTROBAN) 2 % Apply 1 Application  topically daily.     sildenafil (VIAGRA) 100 MG tablet Take 100 mg by mouth as needed for erectile dysfunction.     No current facility-administered medications for this visit.    REVIEW OF SYSTEMS:   [X]  denotes positive finding, [ ]  denotes negative finding Cardiac  Comments:  Chest pain or chest pressure:    Shortness of breath upon exertion:    Short of breath when lying flat:    Irregular heart rhythm:        Vascular    Pain in calf, thigh, or hip brought on by ambulation:    Pain in feet at night that wakes you up from your sleep:     Blood clot in your veins:    Leg swelling:         Pulmonary    Oxygen at home:    Productive cough:     Wheezing:         Neurologic    Sudden weakness in arms or legs:     Sudden numbness in arms or legs:     Sudden onset of difficulty speaking or slurred speech:    Temporary loss of vision in one eye:     Problems with dizziness:         Gastrointestinal    Blood in stool:     Vomited blood:         Genitourinary    Burning when urinating:     Blood in urine:        Psychiatric    Major depression:         Hematologic    Bleeding problems:    Problems with blood clotting too easily:        Skin    Rashes or ulcers:        Constitutional    Fever or chills:      PHYSICAL EXAM:   Vitals:   03/17/22 1048  BP: (!) 163/77  Pulse: 65  Resp: 20  Temp: 98.3 F (36.8 C)  SpO2: 98%  Weight: 217 lb (98.4 kg)  Height: 5\' 9"  (1.753 m)    GENERAL: The patient is a well-nourished male, in no acute distress. The vital signs are documented above. CARDIAC: There is a regular rate and rhythm.  PULMONARY: Non-labored respirations MUSCULOSKELETAL: There are no major deformities or cyanosis. NEUROLOGIC: No focal weakness or paresthesias are detected. SKIN: Bilateral ulcers PSYCHIATRIC: The patient has a normal affect.  STUDIES:   I have reviewed the following: +-------+-----------+-----------+------------+------------+   ABI/TBIToday's ABIToday's TBIPrevious ABIPrevious TBI  +-------+-----------+-----------+------------+------------+  Right  [place]         0.21       [place]  0.00          +-------+-----------+-----------+------------+------------+  Left   [place]         0.27       0.92        0.27          +-------+-----------+-----------+------------+------------+  Right great toe: 37 Left great toe: 47  +----------+--------+-----+---------------+----------+--------+  RIGHT     PSV cm/sRatioStenosis       Waveform  Comments  +----------+--------+-----+---------------+----------+--------+  CFA Distal126                         biphasic            +----------+--------+-----+---------------+----------+--------+  DFA       122                         biphasic            +----------+--------+-----+---------------+----------+--------+  SFA Prox  107                         biphasic            +----------+--------+-----+---------------+----------+--------+  SFA Mid   182                         triphasic           +----------+--------+-----+---------------+----------+--------+  SFA Distal65                          biphasic            +----------+--------+-----+---------------+----------+--------+  POP Prox  58                          biphasic            +----------+--------+-----+---------------+----------+--------+  POP Mid   80                          biphasic            +----------+--------+-----+---------------+----------+--------+  POP Distal248          50-74% stenosisbiphasic            +----------+--------+-----+---------------+----------+--------+  TP Trunk  40                          biphasic            +----------+--------+-----+---------------+----------+--------+  ATA Distal157                         biphasic            +----------+--------+-----+---------------+----------+--------+  PTA Mid   80                           biphasic            +----------+--------+-----+---------------+----------+--------+  PTA Distal31                          monophasic          +----------+--------+-----+---------------+----------+--------+         +----------+--------+-----+--------+----------+--------+  LEFT      PSV cm/sRatioStenosisWaveform  Comments  +----------+--------+-----+--------+----------+--------+  CFA Distal101  biphasic            +----------+--------+-----+--------+----------+--------+  DFA       127                  biphasic            +----------+--------+-----+--------+----------+--------+  SFA Prox  83                   biphasic            +----------+--------+-----+--------+----------+--------+  SFA Mid   125                  biphasic            +----------+--------+-----+--------+----------+--------+  SFA Distal102                  biphasic            +----------+--------+-----+--------+----------+--------+  POP Prox  101                  biphasic            +----------+--------+-----+--------+----------+--------+  POP Mid   83                   biphasic            +----------+--------+-----+--------+----------+--------+  POP Distal62                   biphasic            +----------+--------+-----+--------+----------+--------+  TP Trunk  100                  biphasic            +----------+--------+-----+--------+----------+--------+  ATA Distal29                   monophasic          +----------+--------+-----+--------+----------+--------+  PTA Distal23                   monophasic          +----------+--------+-----+--------+----------+--------+  MEDICAL ISSUES:   Bilateral lower extremity ulcers: According the patient, he says his wounds are getting better.  He continues to get treatment with podiatry.  He will follow-up with me in 3 months for repeat  ultrasound.  Given the extent of his disease, I would not be surprised if he needs to undergo repeat angiography.    Charlena Cross, MD, FACS [facility] [phone] Pager [phone]

## 2022-03-17 NOTE — H&P (View-Only) (Signed)
Vascular and Vein Specialist of [facility]  Patient name: Clinton [name] MRN: [id number] DOB: Dec 30, 1955 Sex: male   REASON FOR VISIT:    Follow up  HISOTRY OF PRESENT ILLNESS:    Clinton [name] is a 66 y.o. male with a history of bilateral lower extremity ulcers since the beginning of the summer.  On 02/04/2022 he underwent angiography.  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  He had diffuse disease out onto the foot.  On 02/18/2022 he had repeat angiography this time of the left leg.  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  These were treated with drug-coated balloon angioplasty.  There was a residual stenosis in the distal popliteal artery and origin of the tibioperoneal trunk which was treated with CSI atherectomy.  He again had diffuse disease out onto the foot.  He is back today for follow-up  The patient underwent right-sided carotid endarterectomy on 09/05/2015 for asymptomatic stenosis by [name].  His last ultrasound showed no recurrence with no significant bilateral stenosis.  He is also undergone evaluation for a aneurysm.  His ultrasound was negative for aortic enlargement.  He takes a statin for hypercholesterolemia.  He is medically managed for hypertension.   PAST MEDICAL HISTORY:   Past Medical History:  Diagnosis Date   Carotid artery occlusion    Cellulitis    Erectile dysfunction    Hyperlipidemia    Hypertension      FAMILY HISTORY:   Family History  Problem Relation Age of Onset   Other Mother        circulatory problems   Heart attack Mother    Deep vein thrombosis Mother    Heart disease Mother    Hyperlipidemia Father    Hypertension Father    Diabetes Father    Deep vein thrombosis Father     Heart disease Father        before age 67   Heart attack Father    Peripheral vascular disease Father    Hypertension Brother    Hyperlipidemia Brother     SOCIAL HISTORY:   Social History   Tobacco Use   Smoking status: Never   Smokeless tobacco: Former    Types: Chew    Quit date: 01/01/2015  Substance Use Topics   Alcohol use: Yes    Alcohol/week: 7.0 standard drinks of alcohol    Types: 7 Cans of beer per week    Comment: pt states that he use to have a beer occasionly but has not had one in about one month     ALLERGIES:   Allergies  Allergen Reactions   Penicillins Rash    Has patient had a PCN reaction causing immediate rash, facial/tongue/throat swelling, SOB or lightheadedness with hypotension: Yes Has patient had a PCN reaction causing severe rash involving mucus membranes or skin necrosis: No Has patient had a PCN reaction that required hospitalization No Has patient had a PCN reaction occurring within the last 10 years: No If all of the above answers are "NO", then may proceed with Cephalosporin use.    Sulfa Antibiotics     Other reaction(s): Unknown     CURRENT MEDICATIONS:   Current Outpatient Medications  Medication Sig Dispense Refill   acetaminophen (TYLENOL) 500 MG tablet Take 1,000 mg by mouth every 6 (  six) hours as needed for moderate pain or mild pain.     amLODipine (NORVASC) 2.5 MG tablet Take 2.5 mg by mouth at bedtime.     aspirin 81 MG tablet Take 81 mg by mouth at bedtime.     atorvastatin (LIPITOR) 40 MG tablet Take 40 mg by mouth at bedtime.     clopidogrel (PLAVIX) 75 MG tablet Take 1 tablet (75 mg total) by mouth daily. (Patient taking differently: Take 75 mg by mouth at bedtime.) 30 tablet 11   irbesartan-hydrochlorothiazide (AVALIDE) 300-12.5 MG tablet Take 1 tablet by mouth at bedtime.     Multiple Vitamin (MULTIVITAMIN) capsule Take 2 capsules by mouth at bedtime. Gummy     mupirocin ointment (BACTROBAN) 2 % Apply 1 Application  topically daily.     sildenafil (VIAGRA) 100 MG tablet Take 100 mg by mouth as needed for erectile dysfunction.     No current facility-administered medications for this visit.    REVIEW OF SYSTEMS:   [X]  denotes positive finding, [ ]  denotes negative finding Cardiac  Comments:  Chest pain or chest pressure:    Shortness of breath upon exertion:    Short of breath when lying flat:    Irregular heart rhythm:        Vascular    Pain in calf, thigh, or hip brought on by ambulation:    Pain in feet at night that wakes you up from your sleep:     Blood clot in your veins:    Leg swelling:         Pulmonary    Oxygen at home:    Productive cough:     Wheezing:         Neurologic    Sudden weakness in arms or legs:     Sudden numbness in arms or legs:     Sudden onset of difficulty speaking or slurred speech:    Temporary loss of vision in one eye:     Problems with dizziness:         Gastrointestinal    Blood in stool:     Vomited blood:         Genitourinary    Burning when urinating:     Blood in urine:        Psychiatric    Major depression:         Hematologic    Bleeding problems:    Problems with blood clotting too easily:        Skin    Rashes or ulcers:        Constitutional    Fever or chills:      PHYSICAL EXAM:   Vitals:   03/17/22 1048  BP: (!) 163/77  Pulse: 65  Resp: 20  Temp: 98.3 F (36.8 C)  SpO2: 98%  Weight: 217 lb (98.4 kg)  Height: 5\' 9"  (1.753 m)    GENERAL: The patient is a well-nourished male, in no acute distress. The vital signs are documented above. CARDIAC: There is a regular rate and rhythm.  PULMONARY: Non-labored respirations MUSCULOSKELETAL: There are no major deformities or cyanosis. NEUROLOGIC: No focal weakness or paresthesias are detected. SKIN: Bilateral ulcers PSYCHIATRIC: The patient has a normal affect.  STUDIES:   I have reviewed the following: +-------+-----------+-----------+------------+------------+   ABI/TBIToday's ABIToday's TBIPrevious ABIPrevious TBI  +-------+-----------+-----------+------------+------------+  Right  [place]         0.21       [place]  0.00          +-------+-----------+-----------+------------+------------+  Left   [place]         0.27       0.92        0.27          +-------+-----------+-----------+------------+------------+  Right great toe: 37 Left great toe: 47  +----------+--------+-----+---------------+----------+--------+  RIGHT     PSV cm/sRatioStenosis       Waveform  Comments  +----------+--------+-----+---------------+----------+--------+  CFA Distal126                         biphasic            +----------+--------+-----+---------------+----------+--------+  DFA       122                         biphasic            +----------+--------+-----+---------------+----------+--------+  SFA Prox  107                         biphasic            +----------+--------+-----+---------------+----------+--------+  SFA Mid   182                         triphasic           +----------+--------+-----+---------------+----------+--------+  SFA Distal65                          biphasic            +----------+--------+-----+---------------+----------+--------+  POP Prox  58                          biphasic            +----------+--------+-----+---------------+----------+--------+  POP Mid   80                          biphasic            +----------+--------+-----+---------------+----------+--------+  POP Distal248          50-74% stenosisbiphasic            +----------+--------+-----+---------------+----------+--------+  TP Trunk  40                          biphasic            +----------+--------+-----+---------------+----------+--------+  ATA Distal157                         biphasic            +----------+--------+-----+---------------+----------+--------+  PTA Mid   80                           biphasic            +----------+--------+-----+---------------+----------+--------+  PTA Distal31                          monophasic          +----------+--------+-----+---------------+----------+--------+         +----------+--------+-----+--------+----------+--------+  LEFT      PSV cm/sRatioStenosisWaveform  Comments  +----------+--------+-----+--------+----------+--------+  CFA Distal101  biphasic            +----------+--------+-----+--------+----------+--------+  DFA       127                  biphasic            +----------+--------+-----+--------+----------+--------+  SFA Prox  83                   biphasic            +----------+--------+-----+--------+----------+--------+  SFA Mid   125                  biphasic            +----------+--------+-----+--------+----------+--------+  SFA Distal102                  biphasic            +----------+--------+-----+--------+----------+--------+  POP Prox  101                  biphasic            +----------+--------+-----+--------+----------+--------+  POP Mid   83                   biphasic            +----------+--------+-----+--------+----------+--------+  POP Distal62                   biphasic            +----------+--------+-----+--------+----------+--------+  TP Trunk  100                  biphasic            +----------+--------+-----+--------+----------+--------+  ATA Distal29                   monophasic          +----------+--------+-----+--------+----------+--------+  PTA Distal23                   monophasic          +----------+--------+-----+--------+----------+--------+  MEDICAL ISSUES:   Bilateral lower extremity ulcers: According the patient, he says his wounds are getting better.  He continues to get treatment with podiatry.  He will follow-up with me in 3 months for repeat  ultrasound.  Given the extent of his disease, I would not be surprised if he needs to undergo repeat angiography.    Charlena Cross, MD, FACS [facility] [phone] Pager [phone]

## 2022-03-21 ENCOUNTER — Other Ambulatory Visit: Payer: Self-pay

## 2022-03-21 DIAGNOSIS — I7025 Atherosclerosis of native arteries of other extremities with ulceration: Secondary | ICD-10-CM

## 2022-03-21 DIAGNOSIS — R7303 Prediabetes: Secondary | ICD-10-CM | POA: Insufficient documentation

## 2022-03-21 DIAGNOSIS — E669 Obesity, unspecified: Secondary | ICD-10-CM | POA: Insufficient documentation

## 2022-03-25 ENCOUNTER — Ambulatory Visit (INDEPENDENT_AMBULATORY_CARE_PROVIDER_SITE_OTHER): Payer: 59 | Admitting: Cardiovascular Disease

## 2022-03-25 ENCOUNTER — Encounter: Payer: Self-pay | Admitting: Cardiovascular Disease

## 2022-03-25 DIAGNOSIS — Z8249 Family history of ischemic heart disease and other diseases of the circulatory system: Secondary | ICD-10-CM

## 2022-03-25 DIAGNOSIS — I10 Essential (primary) hypertension: Secondary | ICD-10-CM | POA: Diagnosis not present

## 2022-03-25 DIAGNOSIS — E782 Mixed hyperlipidemia: Secondary | ICD-10-CM | POA: Diagnosis not present

## 2022-03-25 DIAGNOSIS — I739 Peripheral vascular disease, unspecified: Secondary | ICD-10-CM

## 2022-03-25 NOTE — Progress Notes (Signed)
03/25/2022 Clinton [name]   Oct 24, 1955  [id number]  Primary Physician Clinton Bussing, MD Primary Cardiologist: Clinton Gess MD Clinton [name], Clinton [name], [place]  HPI:  Clinton [name] is a 66 y.o.  mildly overweight married Caucasian male father of 2 children, grandfather to 3 grandchildren who was originally referred by Dr. Darrick [name] for cardiovascular clearance before elective right carotid endarterectomy scheduled for 09/05/15.  I last saw him in the office 08/07/2015.  He is accompanied by his wife Clinton [name] today.  His cardiovascular risk factors are notable for treated hypertension, hyperlipidemia and family history with a father who died of a myocardial infarction at age 21 and a mother at age 16.  His younger brother had CABG several years ago.  He has never had a heart attack or stroke. He denies chest pain or shortness of breath. 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  He has been seeing Dr. Myra [name] for critical limb ischemia and had endovascular therapy of his right popliteal artery and most recently on 02/18/2022, his left popliteal artery and peroneal artery.  His wounds are healing.  Dr. Myra [name] follows him by duplex ultrasound.  Prior to this he was very active working at in [employer] daily and doing yard work for 5 to 6 hours a day.  During that time, he denies chest pain or shortness of breath.  He is very concerned about his family history and wishes to pursue cardiac evaluation.   Current Meds  Medication Sig   acetaminophen (TYLENOL) 500 MG tablet Take 1,000 mg by mouth every 6 (six) hours as needed for moderate pain or mild pain.   amLODipine (NORVASC) 2.5 MG tablet Take 2.5 mg by mouth at bedtime.   aspirin 81 MG tablet Take 81 mg by mouth at bedtime.   atorvastatin (LIPITOR) 40 MG tablet Take 40 mg by mouth at bedtime.   clopidogrel (PLAVIX) 75 MG tablet Take 1 tablet (75 mg total) by mouth daily. (Patient taking differently: Take 75 mg by mouth at bedtime.)    irbesartan-hydrochlorothiazide (AVALIDE) 300-12.5 MG tablet Take 1 tablet by mouth at bedtime.   Multiple Vitamin (MULTIVITAMIN) capsule Take 2 capsules by mouth at bedtime. Gummy   mupirocin ointment (BACTROBAN) 2 % Apply 1 Application topically daily.   sildenafil (VIAGRA) 100 MG tablet Take 100 mg by mouth as needed for erectile dysfunction.     Allergies  Allergen Reactions   Penicillins Rash    Has patient had a PCN reaction causing immediate rash, facial/tongue/throat swelling, SOB or lightheadedness with hypotension: Yes Has patient had a PCN reaction causing severe rash involving mucus membranes or skin necrosis: No Has patient had a PCN reaction that required hospitalization No Has patient had a PCN reaction occurring within the last 10 years: No If all of the above answers are "NO", then may proceed with Cephalosporin use.    Sulfa Antibiotics     Other reaction(s): Unknown    Social History   Socioeconomic History   Marital status: Married    Spouse name: Not on file   Number of children: Not on file   Years of education: Not on file   Highest education level: Not on file  Occupational History   Not on file  Tobacco Use   Smoking status: Never   Smokeless tobacco: Former    Types: Chew    Quit date: 01/01/2015  Vaping Use   Vaping Use: Never used  Substance and Sexual Activity  Alcohol use: Yes    Alcohol/week: 7.0 standard drinks of alcohol    Types: 7 Cans of beer per week    Comment: pt states that he use to have a beer occasionly but has not had one in about one month   Drug use: No   Sexual activity: Not on file  Other Topics Concern   Not on file  Social History Narrative   Not on file   Social Determinants of Health   Financial Resource Strain: Not on file  Food Insecurity: Not on file  Transportation Needs: Not on file  Physical Activity: Not on file  Stress: Not on file  Social Connections: Not on file  Intimate Partner Violence: Not on file      Review of Systems: General: negative for chills, fever, night sweats or weight changes.  Cardiovascular: negative for chest pain, dyspnea on exertion, edema, orthopnea, palpitations, paroxysmal nocturnal dyspnea or shortness of breath Dermatological: negative for rash Respiratory: negative for cough or wheezing Urologic: negative for hematuria Abdominal: negative for nausea, vomiting, diarrhea, bright red blood per rectum, melena, or hematemesis Neurologic: negative for visual changes, syncope, or dizziness All other systems reviewed and are otherwise negative except as noted above.    Blood pressure 128/70, pulse 69, resp. rate 20, height 5\' 9"  (1.753 m), weight 216 lb 3.2 oz (98.1 kg), SpO2 97 %.  General appearance: alert and no distress Neck: no adenopathy, no JVD, supple, symmetrical, trachea midline, thyroid not enlarged, symmetric, no tenderness/mass/nodules, and soft right carotid bruit Lungs: clear to auscultation bilaterally Heart: regular rate and rhythm, S1, S2 normal, no murmur, click, rub or gallop Extremities: extremities normal, atraumatic, no cyanosis or edema Pulses: Diminished pedal pulses Skin: Skin color, texture, turgor normal. 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  I personally reviewed this EKG.  ASSESSMENT AND PLAN:   Occlusion and stenosis of carotid artery without mention of cerebral infarction History of elective carotid endarterectomy by Dr. in 2017 which he follows by duplex ultrasound.  Hypertension History of essential hypertension a blood pressure measured today at 120/70.  He is on amlodipine, and Avalide.  Hyperlipidemia History of hyperlipidemia on statin therapy with lipid profile performed 09/11/2021 revealing total cholesterol of 158, LDL of 83 and HDL 61.  Family history of ischemic heart disease (IHD) Father  died of a myocardial infarction at age 21 and mother at age 58.  Younger brother just had CABG several years ago.  Peripheral arterial disease [facility]) Mr. Pocock has seen Dr. Garfield Cornea in the recent past with endovascular therapy of an occluded right popliteal, and calcified high-grade left popliteal artery stenosis and tibial peroneal trunk for critical limb ischemia.  His wounds are healing.  Dr. Myra [name] follows him by duplex ultrasound.     Clinton Gianotti MD FACP,FACC,FAHA, [facility] 03/25/2022 9:07 AM

## 2022-03-25 NOTE — Assessment & Plan Note (Signed)
History of elective carotid endarterectomy by [name] in 2017 which he follows by duplex ultrasound.

## 2022-03-25 NOTE — Assessment & Plan Note (Signed)
History of essential hypertension a blood pressure measured today at 120/70.  He is on amlodipine, and Avalide.

## 2022-03-25 NOTE — Assessment & Plan Note (Signed)
Father died of a myocardial infarction at age 67 and mother at age 35.  Younger brother just had CABG several years ago.

## 2022-03-25 NOTE — Assessment & Plan Note (Signed)
Clinton [name] has seen [name] in the recent past with endovascular therapy of an occluded right popliteal, and calcified high-grade left popliteal artery stenosis and tibial peroneal trunk for critical limb ischemia.  His wounds are healing.  [name] follows him by duplex ultrasound.

## 2022-03-25 NOTE — Patient Instructions (Signed)
Medication Instructions:  Your physician recommends that you continue on your current medications as directed. Please refer to the Current Medication list given to you today.  *If you need a refill on your cardiac medications before your next appointment, please call your pharmacy*   Testing/Procedures: Your physician has requested that you have an echocardiogram. Echocardiography is a painless test that uses sound waves to create images of your heart. It provides your doctor with information about the size and shape of your heart and how well your heart's chambers and valves are working. This procedure takes approximately one hour. There are no restrictions for this procedure. This procedure will be done at [facility]. 2nd floor   Dr. Allyson Sabal has ordered a CT coronary calcium score.   Test locations:   [facility]  [phone] [facility]  2nd floor)  This is $99 out of pocket.   Coronary CalciumScan A coronary calcium scan is an imaging test used to look for deposits of calcium and other fatty materials (plaques) in the inner lining of the blood vessels of the heart (coronary arteries). These deposits of calcium and plaques can partly clog and narrow the coronary arteries without producing any symptoms or warning signs. This puts a person at risk for a heart attack. This test can detect these deposits before symptoms develop. Tell a health care provider about: Any allergies you have. All medicines you are taking, including vitamins, herbs, eye drops, creams, and over-the-counter medicines. Any problems you or family members have had with anesthetic medicines. Any blood disorders you have. Any surgeries you have had. Any medical conditions you have. Whether you are pregnant or may be pregnant. What are the risks? Generally, this is a safe procedure. However, problems may occur, including: Harm to a pregnant woman and her unborn baby. This test involves the use of  radiation. Radiation exposure can be dangerous to a pregnant woman and her unborn baby. If you are pregnant, you generally should not have this procedure done. Slight increase in the risk of cancer. This is because of the radiation involved in the test. What happens before the procedure? No preparation is needed for this procedure. What happens during the procedure? You will undress and remove any jewelry around your neck or chest. You will put on a hospital gown. Sticky electrodes will be placed on your chest. The electrodes will be connected to an electrocardiogram (ECG) machine to record a tracing of the electrical activity of your heart. A CT scanner will take pictures of your heart. During this time, you will be asked to lie still and hold your breath for 2-3 seconds while a picture of your heart is being taken. The procedure may vary among health care providers and hospitals. What happens after the procedure? You can get dressed. You can return to your normal activities. It is up to you to get the results of your test. Ask your health care provider, or the department that is doing the test, when your results will be ready. Summary A coronary calcium scan is an imaging test used to look for deposits of calcium and other fatty materials (plaques) in the inner lining of the blood vessels of the heart (coronary arteries). Generally, this is a safe procedure. Tell your health care provider if you are pregnant or may be pregnant. No preparation is needed for this procedure. A CT scanner will take pictures of your heart. You can return to your normal activities after the scan is done. This  information is not intended to replace advice given to you by your health care provider. Make sure you discuss any questions you have with your health care provider. Document Released: 01/24/2008 Document Revised: 06/16/2016 Document Reviewed: 06/16/2016 Elsevier Interactive Patient Education  2017 [employer].    Follow-Up: At [facility], you and your health needs are our priority.  As part of our continuing mission to provide you with exceptional heart care, we have created designated Provider Care Teams.  These Care Teams include your primary Cardiologist (physician) and Advanced Practice Providers (APPs -  Physician Assistants and Nurse Practitioners) who all work together to provide you with the care you need, when you need it.  We recommend signing up for the patient portal called "MyChart".  Sign up information is provided on this After Visit Summary.  MyChart is used to connect with patients for Virtual Visits (Telemedicine).  Patients are able to view lab/test results, encounter notes, upcoming appointments, etc.  Non-urgent messages can be sent to your provider as well.   To learn more about what you can do with MyChart, go to [email or web address].    Your next appointment:   12 month(s)  The format for your next appointment:   In Person  Provider:   Nanetta Batty, MD

## 2022-03-25 NOTE — Assessment & Plan Note (Signed)
History of hyperlipidemia on statin therapy with lipid profile performed 09/11/2021 revealing total cholesterol of 158, LDL of 83 and HDL 61.

## 2022-03-28 ENCOUNTER — Telehealth: Payer: Self-pay | Admitting: Cardiovascular Disease

## 2022-03-28 NOTE — Telephone Encounter (Signed)
Pt spouse called stating that she wanted to ask Dr. Allyson Sabal about a gene related to the heart. She states this gene caused her grandson to die after two weeks and she wanted to ask after pt appt a few days ago but she forgot

## 2022-03-28 NOTE — Telephone Encounter (Signed)
Left detailed message informing wife that we are forwarding this note to him and his nurse. Aware someone will follow up next week on this matter. Advised to call back today if there was something further needed.

## 2022-03-31 ENCOUNTER — Encounter (HOSPITAL_BASED_OUTPATIENT_CLINIC_OR_DEPARTMENT_OTHER): Payer: 59 | Attending: General Surgery | Admitting: Internal Medicine

## 2022-03-31 DIAGNOSIS — L97512 Non-pressure chronic ulcer of other part of right foot with fat layer exposed: Secondary | ICD-10-CM | POA: Insufficient documentation

## 2022-03-31 DIAGNOSIS — E785 Hyperlipidemia, unspecified: Secondary | ICD-10-CM | POA: Insufficient documentation

## 2022-03-31 DIAGNOSIS — I10 Essential (primary) hypertension: Secondary | ICD-10-CM | POA: Diagnosis not present

## 2022-03-31 DIAGNOSIS — I70235 Atherosclerosis of native arteries of right leg with ulceration of other part of foot: Secondary | ICD-10-CM | POA: Insufficient documentation

## 2022-03-31 DIAGNOSIS — I739 Peripheral vascular disease, unspecified: Secondary | ICD-10-CM | POA: Diagnosis not present

## 2022-03-31 DIAGNOSIS — R52 Pain, unspecified: Secondary | ICD-10-CM | POA: Diagnosis not present

## 2022-03-31 NOTE — Progress Notes (Signed)
VINOD, MIKESELL ([id number]) Visit Report for 03/31/2022 Abuse Risk Screen Details Patient Name: Date of Service: Clinton [name], Clinton [name] 03/31/2022 2:30 PM Medical Record Number: [id number] Patient Account Number: [id number] Date of Birth/Sex: Treating RN: 1956-02-04 (66 y.o. M) Primary Care Christina Gintz: [name], [place]ib[place] Other Clinician: Referring Mariateresa Batra: Treating Peytan Andringa/Extender: Eda Keys, Dibas Weeks in Treatment: 0 Abuse Risk Screen Items Answer ABUSE RISK SCREEN: Has anyone close to you tried to hurt or harm you recentlyo No Do you feel uncomfortable with anyone in your familyo No Has anyone forced you do things that you didnt want to doo No Electronic Signature(s) Signed: 03/31/2022 4:55:34 PM By: Thayer Dallas Entered By: Thayer Dallas on 03/31/2022 14:58:38 -------------------------------------------------------------------------------- Activities of Daily Living Details Patient Name: Date of Service: Clinton [name], Clinton [name] 03/31/2022 2:30 PM Medical Record Number: [id number] Patient Account Number: [id number] Date of Birth/Sex: Treating RN: Oct 19, 1955 (66 y.o. M) Primary Care Tashiya Souders: [name], Dibas Other Clinician: Referring Mehak Roskelley: Treating Ranika Mcniel/Extender: Eda Keys, Dibas Weeks in Treatment: 0 Activities of Daily Living Items Answer Activities of Daily Living (Please select one for each item) Drive Automobile Completely Able T Medications ake Completely Able Use T elephone Completely Able Care for Appearance Completely Able Use T oilet Completely Able Bath / Shower Completely Able Dress Self Completely Able Feed Self Completely Able Walk Completely Able Get In / Out Bed Completely Able Housework Completely Able Prepare Meals Completely Able Handle Money Completely Able Shop for Self Completely Able Electronic Signature(s) Signed: 03/31/2022 4:55:34 PM By: Thayer Dallas Entered By: Thayer Dallas on 03/31/2022  14:59:05 -------------------------------------------------------------------------------- Education Screening Details Patient Name: Date of Service: [employer]. 03/31/2022 2:30 PM Medical Record Number: [id number] Patient Account Number: [id number] Date of Birth/Sex: Treating RN: [birth year] (66 y.o. M) Primary Care Stephaine Breshears: [name], [place]ibas Other Clinician: Referring Maximillion Gill: Treating Aiden Helzer/Extender: Eda Keys, Dibas Weeks in Treatment: 0 Learning Preferences/Education Level/Primary Language Learning Preference: Explanation, Demonstration, Printed Material Highest Education Level: College or Above Preferred Language: English Cognitive Barrier Language Barrier: No Translator Needed: No Memory Deficit: No Emotional Barrier: No Cultural/Religious Beliefs Affecting Medical Care: No Physical Barrier Impaired Vision: Yes Contacts Impaired Hearing: No Decreased Hand dexterity: No Knowledge/Comprehension Knowledge Level: High Comprehension Level: High Ability to understand written instructions: High Ability to understand verbal instructions: High Motivation Anxiety Level: Calm Cooperation: Cooperative Education Importance: Acknowledges Need Interest in Health Problems: Asks Questions Perception: Coherent Willingness to Engage in Self-Management High Activities: Readiness to Engage in Self-Management High Activities: Electronic Signature(s) Signed: 03/31/2022 4:55:34 PM By: Thayer Dallas Entered By: Thayer Dallas on 03/31/2022 14:59:43 -------------------------------------------------------------------------------- Fall Risk Assessment Details Patient Name: Date of Service: [employer] 03/31/2022 2:30 PM Medical Record Number: [id number] Patient Account Number: [id number] Date of Birth/Sex: Treating RN: 02-14-1956 (66 y.o. M) Primary Care Halina Asano: [name], [place]ibas Other Clinician: Referring Leticia Mcdiarmid: Treating Epimenio Schetter/Extender:  Eda Keys, Dibas Weeks in Treatment: 0 Fall Risk Assessment Items Have you had 2 or more falls in the last 12 monthso 0 No Have you had any fall that resulted in injury in the last 12 monthso 0 No FALLS RISK SCREEN History of falling - immediate or within 3 months 0 No Secondary diagnosis (Do you have 2 or more medical diagnoseso) 0 No Ambulatory aid None/bed rest/wheelchair/nurse 0 Yes Crutches/cane/walker 0 No Furniture 0 No Intravenous therapy Access/Saline/Heparin Lock 0 No Gait/Transferring Normal/ bed rest/ wheelchair 0 Yes Weak (short steps with or without shuffle, stooped but able to lift head while walking, may seek 0 No support  from furniture) Impaired (short steps with shuffle, may have difficulty arising from chair, head down, impaired 0 No balance) Mental Status Oriented to own ability 0 Yes Electronic Signature(s) Signed: 03/31/2022 4:55:34 PM By: Thayer Dallas Entered By: Thayer Dallas on 03/31/2022 15:00:02 -------------------------------------------------------------------------------- Foot Assessment Details Patient Name: Date of Service: [employer] 03/31/2022 2:30 PM Medical Record Number: [id number] Patient Account Number: [id number] Date of Birth/Sex: Treating RN: 08-21-1955 (66 y.o. M) Primary Care Raylea Adcox: [name], Dibas Other Clinician: Referring Fancy Dunkley: Treating Aws Shere/Extender: Eda Keys, Dibas Weeks in Treatment: 0 Foot Assessment Items Site Locations + = Sensation present, - = Sensation absent, C = Callus, U = Ulcer R = Redness, W = Warmth, M = Maceration, PU = Pre-ulcerative lesion F = Fissure, S = Swelling, D = Dryness Assessment Right: Left: Other Deformity: No No Prior Foot Ulcer: No No Prior Amputation: No No Charcot Joint: No No Ambulatory Status: Ambulatory Without Help Gait: Steady Electronic Signature(s) Signed: 03/31/2022 4:55:34 PM By: Thayer Dallas Entered By: Thayer Dallas on  03/31/2022 15:05:35 -------------------------------------------------------------------------------- Nutrition Risk Screening Details Patient Name: Date of Service: Clinton [name], Clinton [name] 03/31/2022 2:30 PM Medical Record Number: [id number] Patient Account Number: [id number] Date of Birth/Sex: Treating RN: 05-09-1956 (66 y.o. M) Primary Care Mahkai Fangman: [name], Dibas Other Clinician: Referring Bonnita Newby: Treating Florian Chauca/Extender: Eda Keys, Dibas Weeks in Treatment: 0 Height (in): 69 Weight (lbs): 215 Body Mass Index (BMI): 31.7 Nutrition Risk Screening Items Score Screening NUTRITION RISK SCREEN: I have an illness or condition that made me change the kind and/or amount of food I eat 0 No I eat fewer than two meals per day 0 No I eat few fruits and vegetables, or milk products 0 No I have three or more drinks of beer, liquor or wine almost every day 2 Yes I have tooth or mouth problems that make it hard for me to eat 0 No I don't always have enough money to buy the food I need 0 No I eat alone most of the time 0 No I take three or more different prescribed or over-the-counter drugs a day 0 No Without wanting to, I have lost or gained 10 pounds in the last six months 0 No I am not always physically able to shop, cook and/or feed myself 0 No Nutrition Protocols Good Risk Protocol 0 No interventions needed Moderate Risk Protocol High Risk Proctocol Risk Level: Good Risk Score: 2 Electronic Signature(s) Signed: 03/31/2022 4:55:34 PM By: Thayer Dallas Entered By: Thayer Dallas on 03/31/2022 15:00:30

## 2022-03-31 NOTE — Progress Notes (Signed)
Clinton [name], Clinton [name] ([id number]) Visit Report for 03/31/2022 Chief Complaint Document Details Patient Name: Date of Service: Clinton [name] 03/31/2022 2:30 PM Medical Record Number: [id number] Patient Account Number: [id number] Date of Birth/Sex: Treating RN: 06/01/1956 (66 y.o. M) Primary Care Provider: Dorthy Cooler, Dibas Other Clinician: Referring Provider: Treating Provider/Extender: Alanda Slim, Dibas Weeks in Treatment: 0 Information Obtained from: Patient Chief Complaint 03/31/2022; right foot wound Electronic Signature(s) Signed: 03/31/2022 5:07:45 PM By: Kalman Shan DO Entered By: Kalman Shan on 03/31/2022 16:49:47 -------------------------------------------------------------------------------- Debridement Details Patient Name: Date of Service: Clinton [name]. 03/31/2022 2:30 PM Medical Record Number: [id number] Patient Account Number: [id number] Date of Birth/Sex: Treating RN: [birth year] (66 y.o. [name] Primary Care Provider: Dorthy Cooler, Dibas Other Clinician: Referring Provider: Treating Provider/Extender: Alanda Slim, Dibas Weeks in Treatment: 0 Debridement Performed for Assessment: Wound #1 Right,Lateral Foot Performed By: Physician Kalman Shan, DO Debridement Type: Debridement Severity of Tissue Pre Debridement: Fat layer exposed Level of Consciousness (Pre-procedure): Awake and Alert Pre-procedure Verification/Time Out Yes - 15:44 Taken: Start Time: 15:45 Pain Control: Lidocaine 5% topical ointment T Area Debrided (L x W): otal 2 (cm) x 1.5 (cm) = 3 (cm) Tissue and other material debrided: Non-Viable, Eschar, Slough, Slough Level: Non-Viable Tissue Debridement Description: Selective/Open Wound Instrument: Forceps, Scissors Bleeding: None End Time: 15:55 Response to Treatment: Procedure was tolerated well Level of Consciousness (Post- Awake and Alert procedure): Post Debridement Measurements of Total  Wound Length: (cm) 2 Width: (cm) 1.9 Depth: (cm) 0.5 Volume: (cm) 1.492 Character of Wound/Ulcer Post Debridement: Stable Severity of Tissue Post Debridement: Fat layer exposed Post Procedure Diagnosis Same as Pre-procedure Electronic Signature(s) Signed: 03/31/2022 5:07:45 PM By: Kalman Shan DO Signed: 03/31/2022 5:31:13 PM By: Lorrin Jackson Entered By: Lorrin Jackson on 03/31/2022 15:56:05 -------------------------------------------------------------------------------- HPI Details Patient Name: Date of Service: Clinton [name]. 03/31/2022 2:30 PM Medical Record Number: [id number] Patient Account Number: [id number] Date of Birth/Sex: Treating RN: [birth year] (66 y.o. M) Primary Care Provider: Dorthy Cooler, Dibas Other Clinician: Referring Provider: Treating Provider/Extender: Alanda Slim, Dibas Weeks in Treatment: 0 History of Present Illness HPI Description: Admission 03/31/2022 [name] is a 66 year old male with a past medical history of hypertension, hyperlipidemia and peripheral arterial disease that presents to the clinic for a 72-month history of nonhealing ulcer to the right lateral foot. He thinks this started from his shoes rubbing against his foot. He does not have neuropathy and is prediabetic. He does have significant peripheral arterial disease that has required arteriogram with intervention. Last done on 02/04/2022. The right leg had Drug-coated balloon angioplasty to the right popliteal artery and balloon angioplasty to the right anterior tibial artery.[name] Kitchen His most recent ABIs with TBI's were noncompressible and 0.21 respectively. Patient currently reports pain, increased erythema and warmth to the foot. He is currently on [place]. He has at least 1 more week supply. He states he had imaging done recently with an x-ray that did not show any acute osseous abnormality. I can not see the image or report. He has been using Santyl to the wound bed. He  denies fever/chills, nausea/vomiting or pain to the lower extremity. Electronic Signature(s) Signed: 03/31/2022 5:07:45 PM By: Kalman Shan DO Entered By: Kalman Shan on 03/31/2022 16:56:10 -------------------------------------------------------------------------------- Physical Exam Details Patient Name: Date of Service: Clinton [name] 03/31/2022 2:30 PM Medical Record Number: [id number] Patient Account Number: [id number] Date of Birth/Sex: Treating RN: [birth year] (66 y.o. M) Primary Care Provider: Dorthy Cooler, Dibas Other Clinician: Referring Provider: Treating Provider/Extender: Heber [place],  Harriet [place], Dibas Weeks in Treatment: 0 Constitutional respirations regular, non-labored and within target range for patient.[name] Kitchen Psychiatric pleasant and cooperative. Notes Right lower extremity: T the right lateral foot there is an open wound with eschar and nonviable tissue throughout. Odor on exam. Postdebridement tendon is o exposed. Cannot tell if this probes to bone due to excess of nonviable tissue. Increased erythema to the periwound. Electronic Signature(s) Signed: 03/31/2022 5:07:45 PM By: Geralyn Corwin DO Entered By: Geralyn Corwin on 03/31/2022 16:56:15 -------------------------------------------------------------------------------- Physician Orders Details Patient Name: Date of Service: Clinton [name]. 03/31/2022 2:30 PM Medical Record Number: [id number] Patient Account Number: [id number] Date of Birth/Sex: Treating RN: 13-Nov-1955 (66 y.o. 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 - 04/07/22 @ 8:45am with Dr. Mikey Bussing and Lennox Laity, RN (Room 7) Return A Anesthetic (In clinic) Topical Lidocaine 5% applied to wound bed (In clinic) Topical Lidocaine 4% applied to wound bed Bathing/ Shower/ Hygiene May shower with protection but do not get wound dressing(s) wet. Off-Loading Other: - Limit time on your feet. Additional Orders / Instructions Follow Nutritious Diet Wound Treatment Wound #1 - Foot Wound Laterality: Right, Lateral Cleanser: Soap and Water 1 x Per Day/30 Days Discharge Instructions: May shower and wash wound with dial antibacterial soap and water prior to dressing change. Prim Dressing: Dakin's Solution 0.25%, 16 (oz) 1 x Per Day/30 Days ary Discharge Instructions: Moisten 2x2 gauze with Dakin's solution Secondary Dressing: Optifoam Non-Adhesive Dressing, 4x4 in 1 x Per Day/30 Days Discharge Instructions: Apply as donut over primary dressing as directed. Secondary Dressing: Woven Gauze Sponges 2x2 in (DME) (Generic) 1 x Per Day/30 Days Discharge Instructions: Apply over primary dressing as directed. Secured With: [employer], 4.5x3.1 (in/yd) (DME) (Generic) 1 x Per Day/30 Days Discharge Instructions: Secure with Kerlix as directed. Secured With: 51M Medipore H Soft Cloth Surgical T ape, 4 x 10 (in/yd) (DME) (Generic) 1 x Per Day/30 Days Discharge Instructions: Secure with tape as directed. Radiology *Urgent* MRI, lower extremity with and without contrast - *Urgent* MRI of right foot to r/o osteomyelitis CPT - (ICD10 L97.512 - Non-pressure : chronic ulcer of other part of right foot with fat layer exposed) Electronic Signature(s) Signed: 03/31/2022 5:07:45 PM By: Geralyn Corwin DO Previous Signature: 03/31/2022 4:08:21 PM Version By: Geralyn Corwin DO Entered By: Geralyn Corwin on 03/31/2022 16:56:50 Prescription  03/31/2022 -------------------------------------------------------------------------------- Mliss Sax. Geralyn Corwin DO Patient Name: Provider: 1956-01-08 [id number] Date of Birth: NPI#: Judie Petit [id number] Sex: DEA #: [phone] Phone #: License #: Eligha Bridegroom [facility] Patient Address: [address] [place], [place] [address] [address] [place], [place] [address] [phone] Allergies Sulfa (Sulfonamide Antibiotics); penicillin Provider's Orders *Urgent* MRI, lower extremity with and without contrast - ICD10: L97.512 - *Urgent* MRI of right foot to r/o osteomyelitis CPT: Hand Signature: Date(s): Electronic Signature(s) Signed: 03/31/2022 5:07:45 PM By: Geralyn Corwin DO Entered By: Geralyn Corwin on 03/31/2022 16:56:51 -------------------------------------------------------------------------------- Problem List Details Patient Name: Date of Service: Clinton [name]. 03/31/2022 2:30 PM Medical Record Number: [id number] Patient Account Number: [id number] Date of  Birth/Sex: Treating RN: 03/05/1956 (66 y.o. M) Primary Care Provider: Dorthy Cooler, Dibas Other Clinician: Referring Provider: Treating Provider/Extender: Alanda Slim, Dibas Weeks in Treatment: 0 Active Problems ICD-10 Encounter Code Description Active Date MDM Diagnosis L97.512 Non-pressure chronic ulcer of other part of right foot with fat layer exposed 03/31/2022 No Yes I70.235 Atherosclerosis of native arteries of right leg with ulceration of other part of 03/31/2022 No Yes foot I73.9 Peripheral vascular disease, unspecified 03/31/2022 No Yes R52 Pain, unspecified 03/31/2022 No Yes Inactive Problems Resolved Problems Electronic Signature(s) Signed: 03/31/2022 5:07:45 PM By: Kalman Shan DO Entered By: Kalman Shan on 03/31/2022 16:48:15 -------------------------------------------------------------------------------- Progress Note  Details Patient Name: Date of Service: Clinton [name]. 03/31/2022 2:30 PM Medical Record Number: [id number] Patient Account Number: [id number] Date of Birth/Sex: Treating RN: 1956-05-17 (65 y.o. M) Primary Care Provider: Dorthy Cooler, Dibas Other Clinician: Referring Provider: Treating Provider/Extender: Alanda Slim, Dibas Weeks in Treatment: 0 Subjective Chief Complaint Information obtained from Patient 03/31/2022; right foot wound History of Present Illness (HPI) Admission 03/31/2022 Mr. Clinton [name] is a 66 year old male with a past medical history of hypertension, hyperlipidemia and peripheral arterial disease that presents to the clinic for a 57-month history of nonhealing ulcer to the right lateral foot. He thinks this started from his shoes rubbing against his foot. He does not have neuropathy and is prediabetic. He does have significant peripheral arterial disease that has required arteriogram with intervention. Last done on 02/04/2022. The right leg had Drug-coated balloon angioplasty to the right popliteal artery and balloon angioplasty to the right anterior tibial artery.[name] Kitchen His most recent ABIs with TBI's were noncompressible and 0.21 respectively. Patient currently reports pain, increased erythema and warmth to the foot. He is currently on [place]. He has at least 1 more week supply. He states he had imaging done recently with an x-ray that did not show any acute osseous abnormality. I can not see the image or report. He has been using Santyl to the wound bed. He denies fever/chills, nausea/vomiting or pain to the lower extremity. Patient History Information obtained from Patient, Chart. Allergies penicillin, Sulfa (Sulfonamide Antibiotics) (Severity: Moderate) Family History Diabetes - Father, Heart Disease - Father, Hypertension - Mother,Father,Siblings, Stroke - Father, Thyroid Problems - Mother, No family history of Cancer, Hereditary Spherocytosis, Kidney  Disease, Lung Disease, Seizures, Tuberculosis. Social History Never smoker, Marital Status - Married, Alcohol Use - Moderate, Drug Use - No History, Caffeine Use - Daily. Medical History Eyes Denies history of Cataracts, Glaucoma, Optic Neuritis Hematologic/Lymphatic Denies history of Anemia, Hemophilia, Human Immunodeficiency Virus, Lymphedema, Sickle Cell Disease Respiratory Denies history of Aspiration, Asthma, Chronic Obstructive Pulmonary Disease (COPD), Pneumothorax, Sleep Apnea, Tuberculosis Cardiovascular Patient has history of Coronary Artery Disease, Hypertension, Peripheral Arterial Disease Denies history of Angina, Arrhythmia, Congestive Heart Failure, Deep Vein Thrombosis, Hypotension, Myocardial Infarction, Peripheral Venous Disease, Phlebitis Gastrointestinal Denies history of Cirrhosis , Colitis, Crohnoos, Hepatitis A, Hepatitis B, Hepatitis C Endocrine Denies history of Type I Diabetes, Type II Diabetes Genitourinary Denies history of End Stage Renal Disease Immunological Denies history of Lupus Erythematosus, Raynaudoos, Scleroderma Integumentary (Skin) Denies history of History of Burn Musculoskeletal Denies history of Gout, Rheumatoid Arthritis, Osteoarthritis, Osteomyelitis Neurologic Patient has history of Neuropathy Denies history of Dementia, Quadriplegia, Paraplegia, Seizure Disorder Oncologic Denies history of Received Chemotherapy, Received Radiation Psychiatric Denies history of Anorexia/bulimia, Confinement Anxiety Review of Systems (ROS) Constitutional Symptoms (General Health) Denies complaints or symptoms of Fatigue, Fever, Chills, Marked Weight Change. Eyes Complains or has symptoms of Glasses / Contacts.  Denies complaints or symptoms of Dry Eyes, Vision Changes. Ear/Nose/Mouth/Throat Denies complaints or symptoms of Chronic sinus problems or rhinitis. Respiratory Denies complaints or symptoms of Chronic or frequent coughs, Shortness of  Breath. Gastrointestinal Denies complaints or symptoms of Frequent diarrhea, Nausea, Vomiting. Endocrine Denies complaints or symptoms of Heat/cold intolerance. Genitourinary Denies complaints or symptoms of Frequent urination. Integumentary (Skin) Complains or has symptoms of Wounds - right foot. Musculoskeletal Denies complaints or symptoms of Muscle Pain, Muscle Weakness. Psychiatric Denies complaints or symptoms of Claustrophobia, Suicidal. Objective Constitutional respirations regular, non-labored and within target range for patient.. Vitals Time Taken: 2:47 PM, Height: 69 in, Source: Stated, Weight: 215 lbs, Source: Stated, BMI: 31.7, Temperature: 98.6 F, Pulse: 88 bpm, Respiratory Rate: 18 breaths/min, Blood Pressure: 145/80 mmHg. Psychiatric pleasant and cooperative. General Notes: Right lower extremity: T the right lateral foot there is an open wound with eschar and nonviable tissue throughout. Odor on exam. o Postdebridement tendon is exposed. Cannot tell if this probes to bone due to excess of nonviable tissue. Increased erythema to the periwound. Integumentary (Hair, Skin) Wound #1 status is Open. Original cause of wound was Gradually Appeared. The date acquired was: 01/22/2022. The wound is located on the Right,Lateral Foot. The wound measures 2cm length x 1.9cm width x 0.5cm depth; 2.985cm^2 area and 1.492cm^3 volume. There is tendon and Fat Layer (Subcutaneous Tissue) exposed. There is no tunneling or undermining noted. There is a medium amount of serosanguineous drainage noted. Foul odor after cleansing was noted. The wound margin is well defined and not attached to the wound [name]. There is no granulation within the wound bed. There is a large (67-100%) amount of necrotic tissue within the wound bed including Eschar and Adherent Slough. Assessment Active Problems ICD-10 Non-pressure chronic ulcer of other part of right foot with fat layer exposed Atherosclerosis of  native arteries of right leg with ulceration of other part of foot Peripheral vascular disease, unspecified Pain, unspecified Patient presents with a 50-month history of nonhealing ulcer to the right lateral foot secondary to to friction from his shoes. The foot appears infected and he is currently on [place]. I recommended he continue this. I removed some nonviable tissue and there is tendon exposed. There is also odor on exam and pain on palpation. At this time I recommended Dakin's wet-to-dry dressings. He needs to offload the area. I recommended a foam donut to help with this. Due to his peripheral arterial disease and current presentation he is at high risk for amputation. He expressed understanding. At this time I recommended an MRI to assess for osteomyelitis. I highly recommended that he go to the emergency department if he has increased erythema, warmth, purulent drainage, fever/chills. Currently vitals are stable and symptoms seem to be stable for the past week Per patient report. Follow-up in 1 week. 47 minutes was spent on the encounter including face-to-face, EMR review and coordination of care Procedures Wound #1 Pre-procedure diagnosis of Wound #1 is an Arterial Insufficiency Ulcer located on the Right,Lateral Foot .Severity of Tissue Pre Debridement is: Fat layer exposed. There was a Selective/Open Wound Non-Viable Tissue Debridement with a total area of 3 sq cm performed by Kalman Shan, DO. With the following instrument(s): Forceps, and Scissors to remove Non-Viable tissue/material. Material removed includes Eschar and Slough and after achieving pain control using Lidocaine 5% topical ointment. No specimens were taken. A time out was conducted at 15:44, prior to the start of the procedure. There was no bleeding. The procedure was tolerated well.  Post Debridement Measurements: 2cm length x 1.9cm width x 0.5cm depth; 1.492cm^3 volume. Character of Wound/Ulcer Post Debridement is  stable. Severity of Tissue Post Debridement is: Fat layer exposed. Post procedure Diagnosis Wound #1: Same as Pre-Procedure Plan Follow-up Appointments: Return Appointment in 1 week. - 04/07/22 @ 8:45am with Dr. Heber Brigantine and Leveda Anna, RN (Room 7) Anesthetic: (In clinic) Topical Lidocaine 5% applied to wound bed (In clinic) Topical Lidocaine 4% applied to wound bed Bathing/ Shower/ Hygiene: May shower with protection but do not get wound dressing(s) wet. Off-Loading: Other: - Limit time on your feet. Additional Orders / Instructions: Follow Nutritious Diet Radiology ordered were: *Urgent* MRI, lower extremity with and without contrast - *Urgent* MRI of right foot to r/o osteomyelitis CPT: WOUND #1: - Foot Wound Laterality: Right, Lateral Cleanser: Soap and Water 1 x Per Day/30 Days Discharge Instructions: May shower and wash wound with dial antibacterial soap and water prior to dressing change. Prim Dressing: Dakin's Solution 0.25%, 16 (oz) 1 x Per Day/30 Days ary Discharge Instructions: Moisten 2x2 gauze with Dakin's solution Secondary Dressing: Optifoam Non-Adhesive Dressing, 4x4 in 1 x Per Day/30 Days Discharge Instructions: Apply as donut over primary dressing as directed. Secondary Dressing: Woven Gauze Sponges 2x2 in (DME) (Generic) 1 x Per Day/30 Days Discharge Instructions: Apply over primary dressing as directed. Secured With: [employer], 4.5x3.1 (in/yd) (DME) (Generic) 1 x Per Day/30 Days Discharge Instructions: Secure with Kerlix as directed. Secured With: 76M Medipore H Soft Cloth Surgical T ape, 4 x 10 (in/yd) (DME) (Generic) 1 x Per Day/30 Days Discharge Instructions: Secure with tape as directed. 1. In office sharp debridement 2. Dakin's wet-to-dry dressings 3. Continue Nuzyra 4. Go to the ED if symptoms worsen 5. Aggressive offloadingoooffloading donut pad 6. Follow-up in 1 week Electronic Signature(s) Signed: 03/31/2022 5:07:45 PM By: Kalman Shan  DO Entered By: Kalman Shan on 03/31/2022 17:03:34 -------------------------------------------------------------------------------- HxROS Details Patient Name: Date of Service: Clinton [name]. 03/31/2022 2:30 PM Medical Record Number: [id number] Patient Account Number: [id number] Date of Birth/Sex: Treating RN: 12-16-1955 (65 y.o. [name] Primary Care Provider: Dorthy Cooler, [place]ibas Other Clinician: Referring Provider: Treating Provider/Extender: Alanda Slim, Dibas Weeks in Treatment: 0 Information Obtained From Patient Chart Constitutional Symptoms (General Health) Complaints and Symptoms: Negative for: Fatigue; Fever; Chills; Marked Weight Change Eyes Complaints and Symptoms: Positive for: Glasses / Contacts Negative for: Dry Eyes; Vision Changes Medical History: Negative for: Cataracts; Glaucoma; Optic Neuritis Ear/Nose/Mouth/Throat Complaints and Symptoms: Negative for: Chronic sinus problems or rhinitis Respiratory Complaints and Symptoms: Negative for: Chronic or frequent coughs; Shortness of Breath Medical History: Negative for: Aspiration; Asthma; Chronic Obstructive Pulmonary Disease (COPD); Pneumothorax; Sleep Apnea; Tuberculosis Gastrointestinal Complaints and Symptoms: Negative for: Frequent diarrhea; Nausea; Vomiting Medical History: Negative for: Cirrhosis ; Colitis; Crohns; Hepatitis A; Hepatitis B; Hepatitis C Endocrine Complaints and Symptoms: Negative for: Heat/cold intolerance Medical History: Negative for: Type I Diabetes; Type II Diabetes Genitourinary Complaints and Symptoms: Negative for: Frequent urination Medical History: Negative for: End Stage Renal Disease Integumentary (Skin) Complaints and Symptoms: Positive for: Wounds - right foot Medical History: Negative for: History of Burn Musculoskeletal Complaints and Symptoms: Negative for: Muscle Pain; Muscle Weakness Medical History: Negative for: Gout; Rheumatoid  Arthritis; Osteoarthritis; Osteomyelitis Psychiatric Complaints and Symptoms: Negative for: Claustrophobia; Suicidal Medical History: Negative for: Anorexia/bulimia; Confinement Anxiety Hematologic/Lymphatic Medical History: Negative for: Anemia; Hemophilia; Human Immunodeficiency Virus; Lymphedema; Sickle Cell Disease Cardiovascular Medical History: Positive for: Coronary Artery Disease; Hypertension; Peripheral Arterial Disease Negative for: Angina; Arrhythmia; Congestive  Heart Failure; Deep Vein Thrombosis; Hypotension; Myocardial Infarction; Peripheral Venous Disease; Phlebitis Immunological Medical History: Negative for: Lupus Erythematosus; Raynauds; Scleroderma Neurologic Medical History: Positive for: Neuropathy Negative for: Dementia; Quadriplegia; Paraplegia; Seizure Disorder Oncologic Medical History: Negative for: Received Chemotherapy; Received Radiation Immunizations Pneumococcal Vaccine: Received Pneumococcal Vaccination: Yes Received Pneumococcal Vaccination On or After 60th Birthday: Yes Implantable Devices None Family and Social History Cancer: No; Diabetes: Yes - Father; Heart Disease: Yes - Father; Hereditary Spherocytosis: No; Hypertension: Yes - Mother,Father,Siblings; Kidney Disease: No; Lung Disease: No; Seizures: No; Stroke: Yes - Father; Thyroid Problems: Yes - Mother; Tuberculosis: No; Never smoker; Marital Status - Married; Alcohol Use: Moderate; Drug Use: No History; Caffeine Use: Daily; Financial Concerns: No; Food, Clothing or Shelter Needs: No; Support System Lacking: No; Transportation Concerns: No Electronic Signature(s) Signed: 03/31/2022 4:55:34 PM By: Thayer Dallas Signed: 03/31/2022 5:07:45 PM By: Geralyn Corwin DO Signed: 03/31/2022 5:31:13 PM By: Antonieta Iba Entered By: Thayer Dallas on 03/31/2022 14:58:26 -------------------------------------------------------------------------------- SuperBill Details Patient Name: Date of  Service: Clinton [name] 03/31/2022 Medical Record Number: [id number] Patient Account Number: [id number] Date of Birth/Sex: Treating RN: 21-May-1956 (65 y.o. [name] Primary Care Provider: [name], Dibas Other Clinician: Referring Provider: Treating Provider/Extender: Eda Keys, Dibas Weeks in Treatment: 0 Diagnosis Coding ICD-10 Codes Code Description [phone] Non-pressure chronic ulcer of other part of right foot with fat layer exposed I70.235 Atherosclerosis of native arteries of right leg with ulceration of other part of foot I73.9 Peripheral vascular disease, unspecified R52 Pain, unspecified Facility Procedures CPT4 Code: [id number] Description: 99213 - WOUND CARE VISIT-LEV 3 EST PT Modifier: 25 Quantity: 1 CPT4 Code: [id number] Description: 97597 - DEBRIDE WOUND 1ST 20 SQ CM OR < ICD-10 Diagnosis Description L97.512 Non-pressure chronic ulcer of other part of right foot with fat layer exposed Modifier: Quantity: 1 Physician Procedures : CPT4 Code Description Modifier [id number] - WC PHYS LEVEL 4 - NEW PT ICD-10 Diagnosis Description L97.512 Non-pressure chronic ulcer of other part of right foot with fat layer exposed I70.235 Atherosclerosis of native arteries of right leg with  ulceration of other part of foot I73.9 Peripheral vascular disease, unspecified R52 Pain, unspecified Quantity: 1 : [id number] - WC PHYS DEBR WO ANESTH 20 SQ CM 1 ICD-10 Diagnosis Description L97.512 Non-pressure chronic ulcer of other part of right foot with fat layer exposed Quantity: Electronic Signature(s) Signed: 03/31/2022 5:07:45 PM By: Geralyn Corwin DO Entered By: Geralyn Corwin on 03/31/2022 17:04:04

## 2022-03-31 NOTE — Progress Notes (Addendum)
Clinton, [name] ([id number]) Visit Report for 03/31/2022 Allergy List Details Patient Name: Date of Service: Clinton, [name] 03/31/2022 2:30 PM Medical Record Number: [id number] Patient Account Number: [id number] Date of Birth/Sex: Treating RN: 12-Dec-1955 (66 y.o. Clinton [name] Primary Care Azadeh Hyder: [name], Dibas Other Clinician: Referring Abdullahi Vallone: Treating Jervis Trapani/Extender: Eda Keys, Dibas Weeks in Treatment: 0 Allergies Active Allergies penicillin Sulfa (Sulfonamide Antibiotics) Severity: Moderate Allergy Notes Electronic Signature(s) Signed: 03/31/2022 4:55:34 PM By: Thayer Dallas Previous Signature: 03/31/2022 12:55:15 PM Version By: Antonieta Iba Entered By: Thayer Dallas on 03/31/2022 14:50:18 -------------------------------------------------------------------------------- Arrival Information Details Patient Name: Date of Service: Clinton [name]. 03/31/2022 2:30 PM Medical Record Number: [id number] Patient Account Number: [id number] Date of Birth/Sex: Treating RN: August 31, 1955 (66 y.o. M) Primary Care Phill Steck: [name], Dibas Other Clinician: Referring [name]: Treating Danashia Landers/Extender: Eda Keys, Dibas Weeks in Treatment: 0 Visit Information Patient Arrived: Ambulatory Arrival Time: 14:43 Accompanied By: wife Transfer Assistance: None Patient Identification Verified: Yes Secondary Verification Process Completed: Yes Patient Requires Transmission-Based Precautions: No Patient Has Alerts: Yes Patient Alerts: Patient on Blood Thinner Electronic Signature(s) Signed: 03/31/2022 4:55:34 PM By: Thayer Dallas Entered By: Thayer Dallas on 03/31/2022 14:46:53 -------------------------------------------------------------------------------- Clinic Level of Care Assessment Details Patient Name: Date of Service: [name], [name] 03/31/2022 2:30 PM Medical Record Number: [id number] Patient Account Number: [id number] Date  of Birth/Sex: Treating RN: July 05, 1956 (66 y.o. Clinton [name] Primary Care Janasia [name]: [name], Dibas Other Clinician: Referring Lucian Baswell: Treating Camyla Camposano/Extender: Eda Keys, Dibas Weeks in Treatment: 0 Clinic Level of Care Assessment Items TOOL 1 Quantity Score X- 1 0 Use when EandM and Procedure is performed on INITIAL visit ASSESSMENTS - Nursing Assessment / Reassessment X- 1 20 General Physical Exam (combine w/ comprehensive assessment (listed just below) when performed on new pt. evals) X- 1 25 Comprehensive Assessment (HX, ROS, Risk Assessments, Wounds Hx, etc.) ASSESSMENTS - Wound and Skin Assessment / Reassessment []  - 0 Dermatologic / Skin Assessment (not related to wound area) ASSESSMENTS - Ostomy and/or Continence Assessment and Care []  - 0 Incontinence Assessment and Management []  - 0 Ostomy Care Assessment and Management (repouching, etc.) PROCESS - Coordination of Care []  - 0 Simple Patient / Family Education for ongoing care X- 1 20 Complex (extensive) Patient / Family Education for ongoing care X- 1 10 Staff obtains , Records, T Results / Process Orders est X- 1 10 Staff telephones HHA, Nursing Homes / Clarify orders / etc []  - 0 Routine Transfer to another Facility (non-emergent condition) []  - 0 Routine Hospital Admission (non-emergent condition) []  - 0 New Admissions / / Ordering NPWT Apligraf, etc. , []  - 0 Emergency Hospital Admission (emergent condition) PROCESS - Special Needs []  - 0 Pediatric / Minor Patient Management []  - 0 Isolation Patient Management []  - 0 Hearing / Language / Visual special needs []  - 0 Assessment of Community assistance (transportation, D/C planning, etc.) []  - 0 Additional assistance / Altered mentation []  - 0 Support Surface(s) Assessment (bed, cushion, seat, etc.) INTERVENTIONS - Miscellaneous []  - 0 External ear exam []  - 0 Patient Transfer (multiple  staff / / Similar devices) []  - 0 Simple Staple / Suture removal (25 or less) []  - 0 Complex Staple / Suture removal (26 or more) []  - 0 Hypo/Hyperglycemic Management (do not check if billed separately) []  - 0 Ankle / Brachial Index (ABI) - do not check if billed separately Has the patient been seen at the hospital within the last three  years: Yes Total Score: 85 Level Of Care: New/Established - Level 3 Electronic Signature(s) Signed: 03/31/2022 5:31:13 PM By: Antonieta Iba Signed: 03/31/2022 5:31:13 PM By: Antonieta Iba Entered By: Antonieta Iba on 03/31/2022 16:11:04 -------------------------------------------------------------------------------- Encounter Discharge Information Details Patient Name: Date of Service: Clinton [name]. 03/31/2022 2:30 PM Medical Record Number: [id number] Patient Account Number: [id number] Date of Birth/Sex: Treating RN: 09/10/1955 (66 y.o. Clinton [name] Primary Care Conn Trombetta: [name], Dibas Other Clinician: Referring Clinton [name]: Treating Anabel Lykins/Extender: Eda Keys, Dibas Weeks in Treatment: 0 Encounter Discharge Information Items Post Procedure Vitals Discharge Condition: Stable Temperature (F): 98.6 Ambulatory Status: Ambulatory Pulse (bpm): 88 Discharge Destination: Home Respiratory Rate (breaths/min): 18 Transportation: Private Auto Blood Pressure (mmHg): 145/80 Accompanied By: wife Schedule Follow-up Appointment: Yes Clinical Summary of Care: Provided on 03/31/2022 Form Type Recipient Paper Patient Patient Electronic Signature(s) Signed: 03/31/2022 5:31:13 PM By: Antonieta Iba Entered By: Antonieta Iba on 03/31/2022 16:25:29 -------------------------------------------------------------------------------- Lower Extremity Assessment Details Patient Name: Date of Service: Clinton [name]. 03/31/2022 2:30 PM Medical Record Number: [id number] Patient Account Number: [id number] Date of  Birth/Sex: Treating RN: 01-05-1956 (66 y.o. M) Primary Care Lynnix Schoneman: [name], Dibas Other Clinician: Referring Clinton [name]: Treating Clinton [name]/Extender: Eda Keys, Dibas Weeks in Treatment: 0 Edema Assessment Assessed: [Left: No] [Right: No] E[Left: dema] [Right: :] Calf Left: Right: Point of Measurement: From Medial Instep 39.5 cm Ankle Left: Right: Point of Measurement: From Medial Instep 26 cm Electronic Signature(s) Signed: 03/31/2022 4:55:34 PM By: Thayer Dallas Entered By: Thayer Dallas on 03/31/2022 15:09:12 -------------------------------------------------------------------------------- Multi Wound Chart Details Patient Name: Date of Service: Clinton [name]. 03/31/2022 2:30 PM Medical Record Number: [id number] Patient Account Number: [id number] Date of Birth/Sex: Treating RN: Oct 28, 1955 (65 y.o. M) Primary Care Rakia Frayne: [name], Dibas Other Clinician: Referring Marcela Alatorre: Treating Keelynn Furgerson/Extender: Eda Keys, Dibas Weeks in Treatment: 0 Vital Signs Height(in): 69 Pulse(bpm): 88 Weight(lbs): 215 Blood Pressure(mmHg): 145/80 Body Mass Index(BMI): 31.7 Temperature(F): 98.6 Respiratory Rate(breaths/min): 18 Photos: [N/A:N/A] Right, Lateral Foot N/A N/A Wound Location: Gradually Appeared N/A N/A Wounding Event: Arterial Insufficiency Ulcer N/A N/A Primary Etiology: Coronary Artery Disease, N/A N/A Comorbid History: Hypertension, Peripheral Arterial Disease, Neuropathy 01/22/2022 N/A N/A Date Acquired: 0 N/A N/A Weeks of Treatment: Open N/A N/A Wound Status: No N/A N/A Wound Recurrence: 2x1.9x0.5 N/A N/A Measurements L x W x D (cm) 2.985 N/A N/A A (cm) : rea 1.492 N/A N/A Volume (cm) : 0.00% N/A N/A % Reduction in Area: 0.00% N/A N/A % Reduction in Volume: Full Thickness With Exposed Support N/A N/A Classification: Structures Medium N/A N/A Exudate A mount: Serosanguineous N/A N/A Exudate Type: red,  brown N/A N/A Exudate Color: Yes N/A N/A Foul Odor A Cleansing: fter No N/A N/A Odor Anticipated Due to Product Use: Well defined, not attached N/A N/A Wound Margin: None Present (0%) N/A N/A Granulation A mount: Large (67-100%) N/A N/A Necrotic Amount: Eschar, Adherent Slough N/A N/A Necrotic Tissue: Fat Layer (Subcutaneous Tissue): Yes N/A N/A Exposed Structures: Tendon: Yes Fascia: No Muscle: No Joint: No Bone: No None N/A N/A Epithelialization: Debridement - Selective/Open Wound N/A N/A Debridement: Pre-procedure Verification/Time Out 15:44 N/A N/A Taken: Lidocaine 5% topical ointment N/A N/A Pain Control: Necrotic/Eschar, Slough N/A N/A Tissue Debrided: Non-Viable Tissue N/A N/A Level: 3 N/A N/A Debridement A (sq cm): rea Forceps, Scissors N/A N/A Instrument: None N/A N/A Bleeding: Procedure was tolerated well N/A N/A Debridement Treatment Response: 2x1.9x0.5 N/A N/A Post Debridement Measurements L x W x D (cm) 1.492 N/A N/A Post Debridement Volume: (  cm) Debridement N/A N/A Procedures Performed: Treatment Notes Wound #1 (Foot) Wound Laterality: Right, Lateral Cleanser Soap and Water Discharge Instruction: May shower and wash wound with dial antibacterial soap and water prior to dressing change. Peri-Wound Care Topical Primary Dressing Dakin's Solution 0.25%, 16 (oz) Discharge Instruction: Moisten 2x2 gauze with Dakin's solution Secondary Dressing Optifoam Non-Adhesive Dressing, 4x4 in Discharge Instruction: Apply as donut over primary dressing as directed. Woven Gauze Sponges 2x2 in Discharge Instruction: Apply over primary dressing as directed. Secured With [employer], 4.5x3.1 (in/yd) Discharge Instruction: Secure with Kerlix as directed. 40M Medipore H Soft Cloth Surgical T ape, 4 x 10 (in/yd) Discharge Instruction: Secure with tape as directed. Compression Wrap Compression Stockings Add-Ons Electronic Signature(s) Signed:  03/31/2022 5:07:45 PM By: Geralyn Corwin DO Entered By: Geralyn Corwin on 03/31/2022 16:48:19 -------------------------------------------------------------------------------- Multi-Disciplinary Care Plan Details Patient Name: Date of Service: Clinton [name]. 03/31/2022 2:30 PM Medical Record Number: [id number] Patient Account Number: [id number] Date of Birth/Sex: Treating RN: 05-20-1956 (66 y.o. Clinton [name] Primary Care Manmeet Arzola: [name], Dibas Other Clinician: Referring Earl Losee: Treating Sherolyn Trettin/Extender: Eda Keys, Dibas Weeks in Treatment: 0 Active Inactive Wound/Skin Impairment Nursing Diagnoses: Impaired tissue integrity Goals: Patient/caregiver will verbalize understanding of skin care regimen Date Initiated: 03/31/2022 Target Resolution Date: 04/28/2022 Goal Status: Active Ulcer/skin breakdown will have a volume reduction of 30% by week 4 Date Initiated: 03/31/2022 Target Resolution Date: 04/28/2022 Goal Status: Active Interventions: Assess patient/caregiver ability to obtain necessary supplies Assess patient/caregiver ability to perform ulcer/skin care regimen upon admission and as needed Assess ulceration(s) every visit Provide education on ulcer and skin care Treatment Activities: Topical wound management initiated : 03/31/2022 Notes: Electronic Signature(s) Signed: 03/31/2022 5:31:13 PM By: Antonieta Iba Entered By: Antonieta Iba on 03/31/2022 15:32:41 -------------------------------------------------------------------------------- Pain Assessment Details Patient Name: Date of Service: Clinton [name]. 03/31/2022 2:30 PM Medical Record Number: [id number] Patient Account Number: [id number] Date of Birth/Sex: Treating RN: Jan 22, 1956 (65 y.o. M) Primary Care Julaine Zimny: [name], Dibas Other Clinician: Referring Amberia Bayless: Treating Esequiel Kleinfelter/Extender: Eda Keys, Dibas Weeks in Treatment: 0 Active Problems Location  of Pain Severity and Description of Pain Patient Has Paino Yes Site Locations Pain Location: Pain in Ulcers With Dressing Change: No Duration of the Pain. Constant / Intermittento Intermittent Rate the pain. Current Pain Level: 7 Character of Pain Describe the Pain: Sharp Pain Management and Medication Current Pain Management: Medication: Yes Rest: Yes Electronic Signature(s) Signed: 03/31/2022 4:55:34 PM By: Thayer Dallas Entered By: Thayer Dallas on 03/31/2022 15:14:55 -------------------------------------------------------------------------------- Patient/Caregiver Education Details Patient Name: Date of Service: Clinton [name] 8/21/2023andnbsp2:30 PM Medical Record Number: [id number] Patient Account Number: [id number] Date of Birth/Gender: Treating RN: 1956/02/22 (66 y.o. Clinton [name] Primary Care Physician: [name], Dibas Other Clinician: Referring Physician: Treating Physician/Extender: Eda Keys, Dibas Weeks in Treatment: 0 Education Assessment Education Provided To: Patient Education Topics Provided Offloading: Methods: Explain/Verbal, Printed Responses: State content correctly Wound/Skin Impairment: Methods: Explain/Verbal, Printed Responses: State content correctly Electronic Signature(s) Signed: 03/31/2022 5:31:13 PM By: Antonieta Iba Entered By: Antonieta Iba on 03/31/2022 15:51:26 -------------------------------------------------------------------------------- Wound Assessment Details Patient Name: Date of Service: Clinton [name]. 03/31/2022 2:30 PM Medical Record Number: [id number] Patient Account Number: [id number] Date of Birth/Sex: Treating RN: [birth year] (65 y.o. Clinton [name] Primary Care Kimra Kantor: [name], Dibas Other Clinician: Referring Alondria Mousseau: Treating Colbin Jovel/Extender: Eda Keys, Dibas Weeks in Treatment: 0 Wound Status Wound Number: 1 Primary Arterial Insufficiency  Ulcer Etiology: Wound Location: Right, Lateral Foot Wound Open Wounding Event: Gradually Appeared  Status: Date Acquired: 01/22/2022 Comorbid Coronary Artery Disease, Hypertension, Peripheral Arterial Weeks Of Treatment: 0 History: Disease, Neuropathy Clustered Wound: No Photos Wound Measurements Length: (cm) 2 Width: (cm) 1.9 Depth: (cm) 0.5 Area: (cm) 2.985 Volume: (cm) 1.492 % Reduction in Area: 0% % Reduction in Volume: 0% Epithelialization: None Tunneling: No Undermining: No Wound Description Classification: Full Thickness With Exposed Support Structu Wound Margin: Well defined, not attached Exudate Amount: Medium Exudate Type: Serosanguineous Exudate Color: red, brown res Foul Odor After Cleansing: Yes Due to Product Use: No Slough/Fibrino Yes Wound Bed Granulation Amount: None Present (0%) Exposed Structure Necrotic Amount: Large (67-100%) Fascia Exposed: No Necrotic Quality: Eschar, Adherent Slough Fat Layer (Subcutaneous Tissue) Exposed: Yes Tendon Exposed: Yes Muscle Exposed: No Joint Exposed: No Bone Exposed: No Treatment Notes Wound #1 (Foot) Wound Laterality: Right, Lateral Cleanser Soap and Water Discharge Instruction: May shower and wash wound with dial antibacterial soap and water prior to dressing change. Peri-Wound Care Topical Primary Dressing Dakin's Solution 0.25%, 16 (oz) Discharge Instruction: Moisten 2x2 gauze with Dakin's solution Secondary Dressing Optifoam Non-Adhesive Dressing, 4x4 in Discharge Instruction: Apply as donut over primary dressing as directed. Woven Gauze Sponges 2x2 in Discharge Instruction: Apply over primary dressing as directed. Secured With [employer], 4.5x3.1 (in/yd) Discharge Instruction: Secure with Kerlix as directed. 12M Medipore H Soft Cloth Surgical T ape, 4 x 10 (in/yd) Discharge Instruction: Secure with tape as directed. Compression Wrap Compression Stockings Add-Ons Electronic  Signature(s) Signed: 03/31/2022 5:31:13 PM By: Antonieta Iba Entered By: Antonieta Iba on 03/31/2022 15:54:52 -------------------------------------------------------------------------------- Vitals Details Patient Name: Date of Service: Clinton [name]. 03/31/2022 2:30 PM Medical Record Number: [id number] Patient Account Number: [id number] Date of Birth/Sex: Treating RN: Jan 08, 1956 (66 y.o. M) Primary Care Suetta Hoffmeister: [name], Dibas Other Clinician: Referring Lille Karim: Treating Ginny Loomer/Extender: Eda Keys, Dibas Weeks in Treatment: 0 Vital Signs Time Taken: 14:47 Temperature (F): 98.6 Height (in): 69 Pulse (bpm): 88 Source: Stated Respiratory Rate (breaths/min): 18 Weight (lbs): 215 Blood Pressure (mmHg): 145/80 Source: Stated Reference Range: 80 - 120 mg / dl Body Mass Index (BMI): 31.7 Electronic Signature(s) Signed: 03/31/2022 4:55:34 PM By: Thayer Dallas Entered By: Thayer Dallas on 03/31/2022 14:48:46

## 2022-04-01 ENCOUNTER — Other Ambulatory Visit (HOSPITAL_COMMUNITY): Payer: Self-pay | Admitting: Internal Medicine

## 2022-04-01 DIAGNOSIS — L97512 Non-pressure chronic ulcer of other part of right foot with fat layer exposed: Secondary | ICD-10-CM

## 2022-04-02 ENCOUNTER — Ambulatory Visit (HOSPITAL_COMMUNITY)
Admission: RE | Admit: 2022-04-02 | Discharge: 2022-04-02 | Disposition: A | Discharge status: Home / Self Care | Payer: 59 | Source: Ambulatory Visit | Attending: Internal Medicine | Admitting: Internal Medicine

## 2022-04-02 ENCOUNTER — Other Ambulatory Visit (HOSPITAL_COMMUNITY): Payer: Self-pay | Admitting: Internal Medicine

## 2022-04-02 DIAGNOSIS — L97512 Non-pressure chronic ulcer of other part of right foot with fat layer exposed: Secondary | ICD-10-CM | POA: Diagnosis present

## 2022-04-03 ENCOUNTER — Telehealth: Payer: Self-pay

## 2022-04-03 ENCOUNTER — Other Ambulatory Visit (HOSPITAL_COMMUNITY): Payer: Self-pay | Admitting: Internal Medicine

## 2022-04-03 ENCOUNTER — Encounter (HOSPITAL_BASED_OUTPATIENT_CLINIC_OR_DEPARTMENT_OTHER): Payer: Self-pay

## 2022-04-03 ENCOUNTER — Other Ambulatory Visit (HOSPITAL_BASED_OUTPATIENT_CLINIC_OR_DEPARTMENT_OTHER): Payer: Self-pay | Admitting: Internal Medicine

## 2022-04-03 ENCOUNTER — Ambulatory Visit (HOSPITAL_BASED_OUTPATIENT_CLINIC_OR_DEPARTMENT_OTHER)
Admission: RE | Admit: 2022-04-03 | Discharge: 2022-04-03 | Disposition: A | Discharge status: Home / Self Care | Payer: 59 | Source: Ambulatory Visit | Attending: Internal Medicine | Admitting: Internal Medicine

## 2022-04-03 DIAGNOSIS — L97512 Non-pressure chronic ulcer of other part of right foot with fat layer exposed: Secondary | ICD-10-CM

## 2022-04-03 MED ORDER — IOHEXOL 300 MG/ML  SOLN
100.0000 mL | Freq: Once | INTRAMUSCULAR | Status: AC | PRN
  Administered 2022-04-03: 100 mL via INTRAVENOUS

## 2022-04-03 NOTE — Telephone Encounter (Signed)
Pt's wife called stating that the top of the pt's R foot had a twitch. It started immediately after surgery. He notes it is typically following pain. When he lays with his leg straight out in a recliner or in bed, he has a shooting pain through his foot and that's when it twitches involuntarily. He has a wound, was seeing podiatry, but now only seeing wound care. They ordered an MRI to see if infection has spread to the bone, but the twitching caused the testing to be incomplete. They will try for a CT again soon.    Staff msg sent to Dr Myra Gianotti for advice. Pt aware.

## 2022-04-04 ENCOUNTER — Encounter (HOSPITAL_BASED_OUTPATIENT_CLINIC_OR_DEPARTMENT_OTHER): Payer: 59 | Admitting: Internal Medicine

## 2022-04-04 DIAGNOSIS — R52 Pain, unspecified: Secondary | ICD-10-CM | POA: Diagnosis not present

## 2022-04-04 DIAGNOSIS — I70235 Atherosclerosis of native arteries of right leg with ulceration of other part of foot: Secondary | ICD-10-CM

## 2022-04-04 DIAGNOSIS — L97512 Non-pressure chronic ulcer of other part of right foot with fat layer exposed: Secondary | ICD-10-CM | POA: Diagnosis not present

## 2022-04-04 DIAGNOSIS — I739 Peripheral vascular disease, unspecified: Secondary | ICD-10-CM

## 2022-04-04 NOTE — Progress Notes (Signed)
Clinton [name] ([id number]) Visit Report for 04/04/2022 Arrival Information Details Patient Name: Date of Service: Clinton [name], Clinton [name] 04/04/2022 8:00 A M Medical Record Number: [id number] Patient Account Number: [id number] Date of Birth/Sex: Treating RN: 07-19-1956 (66 y.o. [name] Primary Care Jackquline Branca: [name], Dibas Other Clinician: Referring Deyon Chizek: Treating Keshonda Monsour/Extender: Eda Keys, Dibas Weeks in Treatment: 0 Visit Information History Since Last Visit All ordered tests and consults were completed: Yes Patient Arrived: Crutches Added or deleted any medications: No Arrival Time: 07:53 Any new allergies or adverse reactions: No Accompanied By: wife Had a fall or experienced change in No Transfer Assistance: Manual activities of daily living that may affect Patient Identification Verified: Yes risk of falls: Secondary Verification Process Completed: Yes Signs or symptoms of abuse/neglect since last visito No Patient Requires Transmission-Based Precautions: No Hospitalized since last visit: No Patient Has Alerts: Yes Implantable device outside of the clinic excluding No Patient Alerts: Patient on Blood Thinner cellular tissue based products placed in the center since last visit: Has Dressing in Place as Prescribed: Yes Pain Present Now: Yes Electronic Signature(s) Signed: 04/04/2022 1:06:25 PM By: Antonieta Iba Entered By: Antonieta Iba on 04/04/2022 08:02:03 -------------------------------------------------------------------------------- Clinic Level of Care Assessment Details Patient Name: Date of Service: Clinton [name] 04/04/2022 8:00 A M Medical Record Number: [id number] Patient Account Number: [id number] Date of Birth/Sex: Treating RN: 09/03/1955 (66 y.o. [name] Primary Care [name]: [name], Dibas Other Clinician: Referring [name]: Treating [name]/Extender: Eda Keys, Dibas Weeks in  Treatment: 0 Clinic Level of Care Assessment Items TOOL 4 Quantity Score X- 1 0 Use when only an EandM is performed on FOLLOW-UP visit ASSESSMENTS - Nursing Assessment / Reassessment X- 1 10 Reassessment of Co-morbidities (includes updates in patient status) X- 1 5 Reassessment of Adherence to Treatment Plan ASSESSMENTS - Wound and Skin A ssessment / Reassessment X - Simple Wound Assessment / Reassessment - one wound 1 5 []  - 0 Complex Wound Assessment / Reassessment - multiple wounds []  - 0 Dermatologic / Skin Assessment (not related to wound area) ASSESSMENTS - Focused Assessment []  - 0 Circumferential Edema Measurements - multi extremities []  - 0 Nutritional Assessment / Counseling / Intervention []  - 0 Lower Extremity Assessment (monofilament, tuning fork, pulses) []  - 0 Peripheral Arterial Disease Assessment (using hand held doppler) ASSESSMENTS - Ostomy and/or Continence Assessment and Care []  - 0 Incontinence Assessment and Management []  - 0 Ostomy Care Assessment and Management (repouching, etc.) PROCESS - Coordination of Care []  - 0 Simple Patient / Family Education for ongoing care X- 1 20 Complex (extensive) Patient / Family Education for ongoing care X- 1 10 Staff obtains , Records, T Results / Process Orders est X- 1 10 Staff telephones HHA, Nursing Homes / Clarify orders / etc []  - 0 Routine Transfer to another Facility (non-emergent condition) []  - 0 Routine Hospital Admission (non-emergent condition) []  - 0 New Admissions / / Ordering NPWT Apligraf, etc. , []  - 0 Emergency Hospital Admission (emergent condition) []  - 0 Simple Discharge Coordination []  - 0 Complex (extensive) Discharge Coordination PROCESS - Special Needs []  - 0 Pediatric / Minor Patient Management []  - 0 Isolation Patient Management []  - 0 Hearing / Language / Visual special needs []  - 0 Assessment of Community assistance (transportation,  D/C planning, etc.) []  - 0 Additional assistance / Altered mentation []  - 0 Support Surface(s) Assessment (bed, cushion, seat, etc.) INTERVENTIONS - Wound Cleansing / Measurement X - Simple Wound Cleansing -  one wound 1 5 []  - 0 Complex Wound Cleansing - multiple wounds X- 1 5 Wound Imaging (photographs - any number of wounds) []  - 0 Wound Tracing (instead of photographs) X- 1 5 Simple Wound Measurement - one wound []  - 0 Complex Wound Measurement - multiple wounds INTERVENTIONS - Wound Dressings X - Small Wound Dressing one or multiple wounds 1 10 []  - 0 Medium Wound Dressing one or multiple wounds []  - 0 Large Wound Dressing one or multiple wounds []  - 0 Application of Medications - topical []  - 0 Application of Medications - injection INTERVENTIONS - Miscellaneous []  - 0 External ear exam []  - 0 Specimen Collection (cultures, biopsies, blood, body fluids, etc.) []  - 0 Specimen(s) / Culture(s) sent or taken to Lab for analysis []  - 0 Patient Transfer (multiple staff / / Similar devices) []  - 0 Simple Staple / Suture removal (25 or less) []  - 0 Complex Staple / Suture removal (26 or more) []  - 0 Hypo / Hyperglycemic Management (close monitor of Blood Glucose) []  - 0 Ankle / Brachial Index (ABI) - do not check if billed separately X- 1 5 Vital Signs Has the patient been seen at the hospital within the last three years: Yes Total Score: 90 Level Of Care: New/Established - Level 3 Electronic Signature(s) Signed: 04/04/2022 1:06:25 PM By: Entered By: on 04/04/2022 09:08:14 -------------------------------------------------------------------------------- Encounter Discharge Information Details Patient Name: Date of Service: Clinton J. 04/04/2022 8:00 A M Medical Record Number: Patient Account Number: Date of Birth/Sex: Treating RN: 05-16-1956 (66 y.o. Nurse, adult Primary Care Allice Garro:  , Dibas Other Clinician: Referring Jesseca Marsch: Treating Austina Constantin/Extender: , Dibas Weeks in Treatment: 0 Encounter Discharge Information Items Discharge Condition: Stable Ambulatory Status: Crutches Discharge Destination: Home Transportation: Private Auto Accompanied By: wife Schedule Follow-up Appointment: Yes Clinical Summary of Care: Provided on 04/04/2022 Form Type Recipient Paper Patient Patient Electronic Signature(s) Signed: 04/04/2022 9:23:15 AM By: 04/06/2022 Entered By: Antonieta Iba on 04/04/2022 09:23:15 -------------------------------------------------------------------------------- Lower Extremity Assessment Details Patient Name: Date of Service: 04/06/2022 J. 04/04/2022 8:00 A M Medical Record Number: 04/06/2022 Patient Account Number: [id number] Date of Birth/Sex: Treating RN: [birth year] (66 y.o. 70 Primary Care Shondrika Hoque: [name], Dibas Other Clinician: Referring Merrilee Ancona: Treating Matina Rodier/Extender: [name], Dibas Weeks in Treatment: 0 Edema Assessment Assessed: [Left: No] [Right: Yes] Edema: [Left: Ye] [Right: s] Calf Left: Right: Point of Measurement: 31 cm From Medial Instep 38.5 cm Ankle Left: Right: Point of Measurement: 9 cm From Medial Instep 27.8 cm Knee To Floor Left: Right: From Medial Instep 40 cm Vascular Assessment Pulses: Dorsalis Pedis Palpable: [Right:Yes] Electronic Signature(s) Signed: 04/04/2022 1:06:25 PM By: 04/06/2022 Entered By: 04/06/2022 on 04/04/2022 08:07:42 -------------------------------------------------------------------------------- Multi Wound Chart Details Patient Name: Date of Service: Antonieta Iba. 04/04/2022 8:00 A M Medical Record Number: Isla Pence Patient Account Number: 04/06/2022 Date of Birth/Sex: Treating RN: October 09, 1955 (66 y.o. 05/27/1956 Primary Care Eliasar Hlavaty: 70, Dibas Other Clinician: Referring  Raynelle Fujikawa: Treating Lennan Malone/Extender: [name], Dibas Weeks in Treatment: 0 Vital Signs Height(in): 69 Pulse(bpm): 94 Weight(lbs): 215 Blood Pressure(mmHg): 148/70 Body Mass Index(BMI): 31.7 Temperature(F): 98.4 Respiratory Rate(breaths/min): 16 Photos: [N/A:N/A] Right, Lateral Foot N/A N/A Wound Location: Gradually Appeared N/A N/A Wounding Event: Arterial Insufficiency Ulcer N/A N/A Primary Etiology: Coronary Artery Disease, N/A N/A Comorbid History: Hypertension, Peripheral Arterial Disease, Neuropathy 01/22/2022 N/A N/A Date Acquired: 0 N/A N/A Weeks of Treatment: Open  N/A N/A Wound Status: No N/A N/A Wound Recurrence: Yes N/A N/A Pending A mputation on Presentation: 1.4x1.7x1 N/A N/A Measurements L x W x D (cm) 1.869 N/A N/A A (cm) : rea 1.869 N/A N/A Volume (cm) : 37.40% N/A N/A % Reduction in A rea: -25.30% N/A N/A % Reduction in Volume: Full Thickness With Exposed Support N/A N/A Classification: Structures Medium N/A N/A Exudate Amount: Serosanguineous N/A N/A Exudate Type: red, brown N/A N/A Exudate Color: Well defined, not attached N/A N/A Wound Margin: None Present (0%) N/A N/A Granulation Amount: Large (67-100%) N/A N/A Necrotic Amount: Fat Layer (Subcutaneous Tissue): Yes N/A N/A Exposed Structures: Tendon: Yes Fascia: No Muscle: No Joint: No Bone: No Small (1-33%) N/A N/A Epithelialization: Treatment Notes Electronic Signature(s) Signed: 04/04/2022 10:24:04 AM By: Geralyn Corwin DO Signed: 04/04/2022 1:06:25 PM By: Antonieta Iba Entered By: Geralyn Corwin on 04/04/2022 09:14:14 -------------------------------------------------------------------------------- Multi-Disciplinary Care Plan Details Patient Name: Date of Service: Georga Hacking Clinton J. 04/04/2022 8:00 A M Medical Record Number: [id number] Patient Account Number: [id number] Date of Birth/Sex: Treating RN: 27-Sep-1955 (66 y.o. [name] Primary Care Tirrell Buchberger: [name], Dibas Other Clinician: Referring Savannaha Stonerock: Treating Saretta Dahlem/Extender: Eda Keys, Dibas Weeks in Treatment: 0 Active Inactive Wound/Skin Impairment Nursing Diagnoses: Impaired tissue integrity Goals: Patient/caregiver will verbalize understanding of skin care regimen Date Initiated: 03/31/2022 Target Resolution Date: 04/28/2022 Goal Status: Active Ulcer/skin breakdown will have a volume reduction of 30% by week 4 Date Initiated: 03/31/2022 Target Resolution Date: 04/28/2022 Goal Status: Active Interventions: Assess patient/caregiver ability to obtain necessary supplies Assess patient/caregiver ability to perform ulcer/skin care regimen upon admission and as needed Assess ulceration(s) every visit Provide education on ulcer and skin care Treatment Activities: Topical wound management initiated : 03/31/2022 Notes: Electronic Signature(s) Signed: 04/04/2022 1:06:25 PM By: Antonieta Iba Entered By: Antonieta Iba on 04/04/2022 07:53:03 -------------------------------------------------------------------------------- Pain Assessment Details Patient Name: Date of Service: Isla Pence J. 04/04/2022 8:00 A M Medical Record Number: [id number] Patient Account Number: [id number] Date of Birth/Sex: Treating RN: 1955-12-16 (66 y.o. [name] Primary Care Monnica Saltsman: Other Clinician: [name], Dibas Referring Aleane Wesenberg: Treating Nicoya Friel/Extender: Eda Keys, Dibas Weeks in Treatment: 0 Active Problems Location of Pain Severity and Description of Pain Patient Has Paino Yes Site Locations Pain Location: Pain in Ulcers With Dressing Change: Yes Duration of the Pain. Constant / Intermittento Intermittent Rate the pain. Current Pain Level: 3 Character of Pain Describe the Pain: Aching, Throbbing Pain Management and Medication Current Pain Management: Medication: Yes Cold Application: No Rest:  Yes Massage: No Activity: No T.E.N.S.: No Heat Application: No Leg drop or elevation: No Is the Current Pain Management Adequate: Adequate How does your wound impact your activities of daily livingo Sleep: No Bathing: No Appetite: No Relationship With Others: No Bladder Continence: No Emotions: No Bowel Continence: No Work: No Toileting: No Drive: No Dressing: No Hobbies: No Electronic Signature(s) Signed: 04/04/2022 1:06:25 PM By: Antonieta Iba Entered By: Antonieta Iba on 04/04/2022 08:04:14 -------------------------------------------------------------------------------- Patient/Caregiver Education Details Patient Name: Date of Service: [employer] 8/25/2023andnbsp8:00 A M Medical Record Number: [id number] Patient Account Number: [id number] Date of Birth/Gender: Treating RN: Apr 17, 1956 (66 y.o. [name] Primary Care Physician: [name], Dibas Other Clinician: Referring Physician: Treating Physician/Extender: Eda Keys, Dibas Weeks in Treatment: 0 Education Assessment Education Provided To: Patient Education Topics Provided Infection: Methods: Explain/Verbal, Printed Responses: State content correctly Offloading: Methods: Explain/Verbal, Printed Responses: State content correctly Wound/Skin Impairment: Methods: Explain/Verbal, Printed Responses: State content correctly Electronic Signature(s) Signed: 04/04/2022  1:06:25 PM By: Antonieta Iba Entered By: Antonieta Iba on 04/04/2022 07:53:33 -------------------------------------------------------------------------------- Wound Assessment Details Patient Name: Date of Service: CARMELO, REIDEL 04/04/2022 8:00 A M Medical Record Number: [id number] Patient Account Number: [id number] Date of Birth/Sex: Treating RN: 11/15/1955 (66 y.o. 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, Lateral Foot Wound Open Wounding Event: Gradually Appeared Status: Date Acquired: 01/22/2022 Comorbid Coronary Artery Disease, Hypertension, Peripheral Arterial Weeks Of Treatment: 0 History: Disease, Neuropathy Clustered Wound: No Pending Amputation On Presentation Photos Wound Measurements Length: (cm) 1.4 Width: (cm) 1.7 Depth: (cm) 1 Area: (cm) 1.869 Volume: (cm) 1.869 % Reduction in Area: 37.4% % Reduction in Volume: -25.3% Epithelialization: Small (1-33%) Tunneling: No Undermining: No Wound Description Classification: Full Thickness With Exposed Support Structures Wound Margin: Well defined, not attached Exudate Amount: Medium Exudate Type: Serosanguineous Exudate Color: red, brown Foul Odor After Cleansing: No Slough/Fibrino Yes Wound Bed Granulation Amount: None Present (0%) Exposed Structure Necrotic Amount: Large (67-100%) Fascia Exposed: No Necrotic Quality: Adherent Slough Fat Layer (Subcutaneous Tissue) Exposed: Yes Tendon Exposed: Yes Muscle Exposed: No Joint Exposed: No Bone Exposed: No Treatment Notes Wound #1 (Foot) Wound Laterality: Right, Lateral Cleanser Soap and Water Discharge Instruction: May shower and wash wound with dial antibacterial soap and water prior to dressing change. Peri-Wound Care Topical Primary Dressing Dakin's Solution 0.25%, 16 (oz) Discharge Instruction: Moisten 2x2 gauze with Dakin's solution Secondary Dressing Optifoam Non-Adhesive Dressing, 4x4 in Discharge Instruction: Apply as donut over primary dressing as directed. Woven Gauze Sponges 2x2 in Discharge Instruction: Apply over primary dressing as directed. Secured With [employer], 4.5x3.1 (in/yd) Discharge Instruction: Secure with Kerlix as directed. 60M Medipore H Soft Cloth Surgical T ape, 4 x 10  (in/yd) Discharge Instruction: Secure with tape as directed. Compression Wrap Compression Stockings Add-Ons Electronic Signature(s) Signed: 04/04/2022 1:06:25 PM By: Antonieta Iba Entered By: Antonieta Iba on 04/04/2022 08:10:37 -------------------------------------------------------------------------------- Vitals Details Patient Name: Date of Service: Georga Hacking Clinton J. 04/04/2022 8:00 A M Medical Record Number: [id number] Patient Account Number: [id number] Date of Birth/Sex: Treating RN: 11-Jun-1956 (66 y.o. [name] Primary Care Wai Litt: [name], Dibas Other Clinician: Referring Ziair Penson: Treating Melitta Tigue/Extender: Eda Keys, Dibas Weeks in Treatment: 0 Vital Signs Time Taken: 08:03 Temperature (F): 98.4 Height (in): 69 Pulse (bpm): 94 Weight (lbs): 215 Respiratory Rate (breaths/min): 16 Body Mass Index (BMI): 31.7 Blood Pressure (mmHg): 148/70 Reference Range: 80 - 120 mg / dl Electronic Signature(s) Signed: 04/04/2022 1:06:25 PM By: Antonieta Iba Entered By: Antonieta Iba on 04/04/2022 08:03:35

## 2022-04-04 NOTE — Progress Notes (Signed)
Clinton [name], Clinton [name] ([id number]) Visit Report for 04/04/2022 Chief Complaint Document Details Patient Name: Date of Service: Clinton [name], Clinton [name] 04/04/2022 8:00 A M Medical Record Number: [id number] Patient Account Number: [id number] Date of Birth/Sex: Treating RN: 09-Oct-1955 (66 y.o. Clinton [name] Primary Care Provider: [name], Dibas Other Clinician: Referring Provider: Treating Provider/Extender: Eda Keys, Dibas Weeks in Treatment: 0 Information Obtained from: Patient Chief Complaint 03/31/2022; right foot wound Electronic Signature(s) Signed: 04/04/2022 10:24:04 AM By: Geralyn Corwin DO Entered By: Geralyn Corwin on 04/04/2022 09:14:44 -------------------------------------------------------------------------------- HPI Details Patient Name: Date of Service: Clinton Hacking EL J. 04/04/2022 8:00 A M Medical Record Number: [id number] Patient Account Number: [id number] Date of Birth/Sex: Treating RN: [birth year] (66 y.o. Clinton [name] Primary Care Provider: [name], Dibas Other Clinician: Referring Provider: Treating Provider/Extender: Eda Keys, Dibas Weeks in Treatment: 0 History of Present Illness HPI Description: Admission 03/31/2022 Clinton [name] is a 66 year old male with a past medical history of hypertension, hyperlipidemia and peripheral arterial disease that presents to the clinic for a 64-month history of nonhealing ulcer to the right lateral foot. He thinks this started from his shoes rubbing against his foot. He does not have neuropathy and is prediabetic. He does have significant peripheral arterial disease that has required arteriogram with intervention. Last done on 02/04/2022. The right leg had Drug-coated balloon angioplasty to the right popliteal artery and balloon angioplasty to the right anterior tibial artery.[name] Kitchen His most recent ABIs with TBI's were noncompressible and 0.21 respectively. Patient currently reports pain,  increased erythema and warmth to the foot. He is currently on [place]. He has at least 1 more week supply. He states he had imaging done recently with an x-ray that did not show any acute osseous abnormality. I can not see the image or report. He has been using Santyl to the wound bed. He denies fever/chills, nausea/vomiting or pain to the lower extremity. 8/25; patient was scheduled for follow-up next week however due to results of the CT scan patient wanted to be seen sooner. He is still taking [place]. He been doing Dakin's wet-to-dry dressings. His symptoms are stable and he does not have increased pain, warmth or erythema to the wound. No purulent drainage. Electronic Signature(s) Signed: 04/04/2022 10:24:04 AM By: Geralyn Corwin DO Entered By: Geralyn Corwin on 04/04/2022 09:15:54 -------------------------------------------------------------------------------- Physical Exam Details Patient Name: Date of Service: Clinton Base. 04/04/2022 8:00 A M Medical Record Number: [id number] Patient Account Number: [id number] Date of Birth/Sex: Treating RN: [birth year] (66 y.o. Clinton [name] Primary Care Provider: [name], Dibas Other Clinician: Referring Provider: Treating Provider/Extender: Eda Keys, Dibas Weeks in Treatment: 0 Constitutional respirations regular, non-labored and within target range for patient.[name] Kitchen Psychiatric pleasant and cooperative. Notes Right lower extremity: T the right plantar/lateral foot there is an open wound with nonviable tissue throughout. No increased erythema, warmth or purulent o drainage. Electronic Signature(s) Signed: 04/04/2022 10:24:04 AM By: Geralyn Corwin DO Entered By: Geralyn Corwin on 04/04/2022 09:16:49 -------------------------------------------------------------------------------- Physician Orders Details Patient Name: Date of Service: Clinton Hacking EL J. 04/04/2022 8:00 A M Medical Record Number: [id number] Patient  Account Number: [id number] Date of Birth/Sex: Treating RN: 26-Apr-1956 (66 y.o. Clinton [name] Primary Care Provider: [name], Dibas Other Clinician: Referring Provider: Treating Provider/Extender: Eda Keys, Dibas Weeks in Treatment: 0 Verbal / Phone Orders: No Diagnosis Coding ICD-10 Coding Code Description L97.512 Non-pressure chronic ulcer of other part of right foot with fat layer exposed I70.235 Atherosclerosis of native arteries of right leg with ulceration of  other part of foot I73.9 Peripheral vascular disease, unspecified R52 Pain, unspecified Follow-up Appointments ppointment in 1 week. - 04/11/22 @ 9:30am with Dr. Heber [place] and Leveda Anna, RN (Room 7) Return A ppointment in 2 weeks. - 04/17/22 @ 3:30pm Return A Other: - Infectious Disease 04/08/22 Anesthetic (In clinic) Topical Lidocaine 5% applied to wound bed (In clinic) Topical Lidocaine 4% applied to wound bed Bathing/ Shower/ Hygiene May shower with protection but do not get wound dressing(s) wet. Off-Loading Crutches for Offloading Other: - Limit time on your feet. Additional Orders / Instructions Follow Nutritious Diet Wound Treatment Wound #1 - Foot Wound Laterality: Right, Lateral Cleanser: Soap and Water 1 x Per Day/30 Days Discharge Instructions: May shower and wash wound with dial antibacterial soap and water prior to dressing change. Prim Dressing: Dakin's Solution 0.25%, 16 (oz) 1 x Per Day/30 Days ary Discharge Instructions: Moisten 2x2 gauze with Dakin's solution Secondary Dressing: Optifoam Non-Adhesive Dressing, 4x4 in 1 x Per Day/30 Days Discharge Instructions: Apply as donut over primary dressing as directed. Secondary Dressing: Woven Gauze Sponges 2x2 in (Generic) 1 x Per Day/30 Days Discharge Instructions: Apply over primary dressing as directed. Secured With: [employer], 4.5x3.1 (in/yd) (Generic) 1 x Per Day/30 Days Discharge Instructions: Secure with Kerlix as  directed. Secured With: 16M Medipore H Soft Cloth Surgical T ape, 4 x 10 (in/yd) (Generic) 1 x Per Day/30 Days Discharge Instructions: Secure with tape as directed. Electronic Signature(s) Signed: 04/04/2022 10:24:04 AM By: Kalman Shan DO Entered By: Kalman Shan on 04/04/2022 09:16:55 -------------------------------------------------------------------------------- Problem List Details Patient Name: Date of Service: Narda Rutherford EL J. 04/04/2022 8:00 A M Medical Record Number: [id number] Patient Account Number: [id number] Date of Birth/Sex: Treating RN: 1956/02/17 (66 y.o. [name] Primary Care Provider: Dorthy Cooler, Dibas Other Clinician: Referring Provider: Treating Provider/Extender: Alanda Slim, Dibas Weeks in Treatment: 0 Active Problems ICD-10 Encounter Code Description Active Date MDM Diagnosis L97.512 Non-pressure chronic ulcer of other part of right foot with fat layer exposed 03/31/2022 No Yes I70.235 Atherosclerosis of native arteries of right leg with ulceration of other part of 03/31/2022 No Yes foot I73.9 Peripheral vascular disease, unspecified 03/31/2022 No Yes R52 Pain, unspecified 03/31/2022 No Yes Inactive Problems Resolved Problems Electronic Signature(s) Signed: 04/04/2022 10:24:04 AM By: Kalman Shan DO Entered By: Kalman Shan on 04/04/2022 09:14:10 -------------------------------------------------------------------------------- Progress Note Details Patient Name: Date of Service: Clinton [name]. 04/04/2022 8:00 A M Medical Record Number: [id number] Patient Account Number: [id number] Date of Birth/Sex: Treating RN: 1956-06-30 (66 y.o. [name] Primary Care Provider: Dorthy Cooler, Dibas Other Clinician: Referring Provider: Treating Provider/Extender: Alanda Slim, Dibas Weeks in Treatment: 0 Subjective Chief Complaint Information obtained from Patient 03/31/2022; right foot wound History of Present  Illness (HPI) Admission 03/31/2022 Mr. Jakarri Holz is a 66 year old male with a past medical history of hypertension, hyperlipidemia and peripheral arterial disease that presents to the clinic for a 89-month history of nonhealing ulcer to the right lateral foot. He thinks this started from his shoes rubbing against his foot. He does not have neuropathy and is prediabetic. He does have significant peripheral arterial disease that has required arteriogram with intervention. Last done on 02/04/2022. The right leg had Drug-coated balloon angioplasty to the right popliteal artery and balloon angioplasty to the right anterior tibial artery.[name] Kitchen His most recent ABIs with TBI's were noncompressible and 0.21 respectively. Patient currently reports pain, increased erythema and warmth to the foot. He is currently on [place]. He has at least 1 more week supply.  He states he had imaging done recently with an x-ray that did not show any acute osseous abnormality. I can not see the image or report. He has been using Santyl to the wound bed. He denies fever/chills, nausea/vomiting or pain to the lower extremity. 8/25; patient was scheduled for follow-up next week however due to results of the CT scan patient wanted to be seen sooner. He is still taking [place]. He been doing Dakin's wet-to-dry dressings. His symptoms are stable and he does not have increased pain, warmth or erythema to the wound. No purulent drainage. Patient History Information obtained from Patient, Chart. Family History Diabetes - Father, Heart Disease - Father, Hypertension - Mother,Father,Siblings, Stroke - Father, Thyroid Problems - Mother, No family history of Cancer, Hereditary Spherocytosis, Kidney Disease, Lung Disease, Seizures, Tuberculosis. Social History Never smoker, Marital Status - Married, Alcohol Use - Moderate, Drug Use - No History, Caffeine Use - Daily. Medical History Eyes Denies history of Cataracts, Glaucoma, Optic  Neuritis Hematologic/Lymphatic Denies history of Anemia, Hemophilia, Human Immunodeficiency Virus, Lymphedema, Sickle Cell Disease Respiratory Denies history of Aspiration, Asthma, Chronic Obstructive Pulmonary Disease (COPD), Pneumothorax, Sleep Apnea, Tuberculosis Cardiovascular Patient has history of Coronary Artery Disease, Hypertension, Peripheral Arterial Disease Denies history of Angina, Arrhythmia, Congestive Heart Failure, Deep Vein Thrombosis, Hypotension, Myocardial Infarction, Peripheral Venous Disease, Phlebitis Gastrointestinal Denies history of Cirrhosis , Colitis, Crohnoos, Hepatitis A, Hepatitis B, Hepatitis C Endocrine Denies history of Type I Diabetes, Type II Diabetes Genitourinary Denies history of End Stage Renal Disease Immunological Denies history of Lupus Erythematosus, Raynaudoos, Scleroderma Integumentary (Skin) Denies history of History of Burn Musculoskeletal Denies history of Gout, Rheumatoid Arthritis, Osteoarthritis, Osteomyelitis Neurologic Patient has history of Neuropathy Denies history of Dementia, Quadriplegia, Paraplegia, Seizure Disorder Oncologic Denies history of Received Chemotherapy, Received Radiation Psychiatric Denies history of Anorexia/bulimia, Confinement Anxiety Objective Constitutional respirations regular, non-labored and within target range for patient.. Vitals Time Taken: 8:03 AM, Height: 69 in, Weight: 215 lbs, BMI: 31.7, Temperature: 98.4 F, Pulse: 94 bpm, Respiratory Rate: 16 breaths/min, Blood Pressure: 148/70 mmHg. Psychiatric pleasant and cooperative. General Notes: Right lower extremity: T the right plantar/lateral foot there is an open wound with nonviable tissue throughout. No increased erythema, warmth o or purulent drainage. Integumentary (Hair, Skin) Wound #1 status is Open. Original cause of wound was Gradually Appeared. The date acquired was: 01/22/2022. The wound is located on the Right,Lateral Foot. The  wound measures 1.4cm length x 1.7cm width x 1cm depth; 1.869cm^2 area and 1.869cm^3 volume. There is tendon and Fat Layer (Subcutaneous Tissue) exposed. There is no tunneling or undermining noted. There is a medium amount of serosanguineous drainage noted. The wound margin is well defined and not attached to the wound base. There is no granulation within the wound bed. There is a large (67-100%) amount of necrotic tissue within the wound bed including Adherent Slough. 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 There is less erythema to the periwound compared to last clinic visit. Patient was scheduled for an MRI yesterday. We were contacted by the radiology department in the morning that the patient could not complete the study due to leg twitching. At that time the order was switched to CT scan. This image showed osteomyelitis of the fifth metatarsal head and adjacent base of the fifth proximal phalanx. No fluid  collection was noted. Results discussed with patient. We discussed limb salvage with antibiotics versus definitive treatment with amputation. At this time he does not want to consider amputation. A referral to infectious disease has been made. He is scheduled to see Dr. Renold Don on 8/29. Patient also has a history of peripheral arterial disease with most recent TBI of 0.21 on the right. I recommended he follow-up with [name] to discuss current findings of osteomyelitis and to reassess any further need for intervention to improve blood flow status. We also discussed HBO therapy and patient is interested in pursuing this. We will rediscuss this at next clinic visit. He does not have a diagnosis of diabetes. 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 50 minutes was spent on the encounter including face-to-face, EMR review and coordination of care Plan Follow-up Appointments: Return Appointment in 1 week. - 04/11/22 @ 9:30am with Dr. Mikey Bussing and Lennox Laity, RN (Room 7) Return Appointment in 2 weeks. - 04/17/22 @ 3:30pm Other: - Infectious Disease 04/08/22 Anesthetic: (In clinic) Topical Lidocaine 5% applied to wound bed (In clinic) Topical Lidocaine 4% applied to wound bed Bathing/ Shower/ Hygiene: May shower with protection but do not get wound dressing(s) wet. Off-Loading: Crutches for Offloading Other: - Limit time on your feet. Additional Orders / Instructions: Follow Nutritious Diet WOUND #1: - Foot Wound Laterality: Right, Lateral Cleanser: Soap and Water 1 x Per Day/30 Days Discharge Instructions: May shower and wash wound with dial antibacterial soap and water prior to dressing change. Prim Dressing: Dakin's Solution 0.25%, 16 (oz) 1 x Per Day/30 Days ary Discharge Instructions: Moisten 2x2 gauze with Dakin's solution Secondary Dressing: Optifoam Non-Adhesive Dressing, 4x4 in 1 x Per Day/30 Days Discharge Instructions: Apply as donut over primary dressing as directed. Secondary Dressing: Woven Gauze Sponges 2x2 in (Generic) 1 x Per Day/30 Days Discharge Instructions: Apply over primary dressing as directed. Secured With: [employer], 4.5x3.1 (in/yd) (Generic) 1 x Per Day/30 Days Discharge Instructions: Secure with Kerlix as directed. Secured With: 72M Medipore H Soft Cloth Surgical T ape, 4 x 10 (in/yd) (Generic) 1 x Per Day/30 Days Discharge Instructions: Secure with tape as directed. 1. Continue Nuzyra 2. Follow-up with infectious disease 3. Follow-up with vein and vascular 4. Continue Dakin's wet-to-dry dressings 5. Continue aggressive offloadingoocrutches 6. Follow-up in 1 week Electronic Signature(s) Signed: 04/04/2022 10:24:04 AM By: Geralyn Corwin  DO Entered By: Geralyn Corwin on 04/04/2022 09:24:55 -------------------------------------------------------------------------------- HxROS Details Patient Name: Date of Service: Clinton Hacking EL J. 04/04/2022 8:00 A M Medical Record Number: [id number] Patient Account Number: [id number] Date of Birth/Sex: Treating RN: 1955-12-21 (66 y.o. Clinton [name] Primary Care Provider: [name], Dibas Other Clinician: Referring Provider: Treating Provider/Extender: Eda Keys, Dibas Weeks in Treatment: 0 Information Obtained From Patient Chart Eyes Medical History: Negative for: Cataracts; Glaucoma; Optic Neuritis Hematologic/Lymphatic Medical History: Negative for: Anemia; Hemophilia; Human Immunodeficiency Virus; Lymphedema; Sickle Cell Disease Respiratory Medical History: Negative for: Aspiration; Asthma; Chronic Obstructive Pulmonary Disease (COPD); Pneumothorax; Sleep Apnea; Tuberculosis Cardiovascular Medical History: Positive for: Coronary Artery Disease; Hypertension; Peripheral Arterial Disease Negative for: Angina; Arrhythmia; Congestive Heart Failure; Deep Vein Thrombosis; Hypotension; Myocardial Infarction; Peripheral Venous Disease; Phlebitis Gastrointestinal Medical History: Negative for: Cirrhosis ; Colitis; Crohns; Hepatitis A; Hepatitis B; Hepatitis C Endocrine Medical History: Negative for: Type I Diabetes; Type II Diabetes Genitourinary Medical History: Negative for: End Stage Renal Disease Immunological Medical History: Negative for: Lupus Erythematosus; Raynauds; Scleroderma Integumentary (Skin) Medical History:  Negative for: History of Burn Musculoskeletal Medical History: Negative for: Gout; Rheumatoid Arthritis; Osteoarthritis; Osteomyelitis Neurologic Medical History: Positive for: Neuropathy Negative for: Dementia; Quadriplegia; Paraplegia; Seizure Disorder Oncologic Medical History: Negative for: Received Chemotherapy; Received  Radiation Psychiatric Medical History: Negative for: Anorexia/bulimia; Confinement Anxiety Immunizations Pneumococcal Vaccine: Received Pneumococcal Vaccination: Yes Received Pneumococcal Vaccination On or After 60th Birthday: Yes Implantable Devices None Family and Social History Cancer: No; Diabetes: Yes - Father; Heart Disease: Yes - Father; Hereditary Spherocytosis: No; Hypertension: Yes - Mother,Father,Siblings; Kidney Disease: No; Lung Disease: No; Seizures: No; Stroke: Yes - Father; Thyroid Problems: Yes - Mother; Tuberculosis: No; Never smoker; Marital Status - Married; Alcohol Use: Moderate; Drug Use: No History; Caffeine Use: Daily; Financial Concerns: No; Food, Clothing or Shelter Needs: No; Support System Lacking: No; Transportation Concerns: No Electronic Signature(s) Signed: 04/04/2022 10:24:04 AM By: Kalman Shan DO Signed: 04/04/2022 1:06:25 PM By: Lorrin Jackson Entered By: Kalman Shan on 04/04/2022 09:16:04 -------------------------------------------------------------------------------- [place] Details Patient Name: Date of Service: Clinton [name] 04/04/2022 Medical Record Number: [id number] Patient Account Number: [id number] Date of Birth/Sex: Treating RN: 11-21-1955 (65 y.o. [name] Primary Care Provider: Dorthy Cooler, Dibas Other Clinician: Referring Provider: Treating Provider/Extender: Alanda Slim, Dibas Weeks in Treatment: 0 Diagnosis Coding ICD-10 Codes Code Description [phone] Non-pressure chronic ulcer of other part of right foot with fat layer exposed I70.235 Atherosclerosis of native arteries of right leg with ulceration of other part of foot I73.9 Peripheral vascular disease, unspecified R52 Pain, unspecified Facility Procedures CPT4 Code: [id number] Description: 99213 - WOUND CARE VISIT-LEV 3 EST PT Modifier: Quantity: 1 Physician Procedures : CPT4 Code Description Modifier [id number] - WC PHYS LEVEL 4 - EST  PT ICD-10 Diagnosis Description L97.512 Non-pressure chronic ulcer of other part of right foot with fat layer exposed I70.235 Atherosclerosis of native arteries of right leg with  ulceration of other part of foot I73.9 Peripheral vascular disease, unspecified R52 Pain, unspecified Quantity: 1 Electronic Signature(s) Signed: 04/04/2022 10:24:04 AM By: Kalman Shan DO Entered By: Kalman Shan on 04/04/2022 09:25:00

## 2022-04-07 ENCOUNTER — Encounter (HOSPITAL_BASED_OUTPATIENT_CLINIC_OR_DEPARTMENT_OTHER): Payer: 59 | Admitting: Internal Medicine

## 2022-04-08 ENCOUNTER — Other Ambulatory Visit: Payer: Self-pay

## 2022-04-08 ENCOUNTER — Telehealth: Payer: Self-pay

## 2022-04-08 ENCOUNTER — Encounter: Payer: Self-pay | Admitting: Internal Medicine

## 2022-04-08 ENCOUNTER — Ambulatory Visit (INDEPENDENT_AMBULATORY_CARE_PROVIDER_SITE_OTHER): Payer: 59 | Admitting: Internal Medicine

## 2022-04-08 VITALS — BP 135/71 | HR 82 | Temp 97.8°F | Ht 70.0 in | Wt 216.0 lb

## 2022-04-08 DIAGNOSIS — I7025 Atherosclerosis of native arteries of other extremities with ulceration: Secondary | ICD-10-CM

## 2022-04-08 DIAGNOSIS — L97514 Non-pressure chronic ulcer of other part of right foot with necrosis of bone: Secondary | ICD-10-CM | POA: Diagnosis not present

## 2022-04-08 DIAGNOSIS — S91309A Unspecified open wound, unspecified foot, initial encounter: Secondary | ICD-10-CM

## 2022-04-08 DIAGNOSIS — R9389 Abnormal findings on diagnostic imaging of other specified body structures: Secondary | ICD-10-CM

## 2022-04-08 NOTE — Progress Notes (Signed)
[facility]  Reason for Consult:chronic right foot ulcer Referring Provider: Heber Glasscock, of wound care team    Patient Active Problem List   Diagnosis Date Noted   Peripheral arterial disease ([place]) 03/25/2022   Obesity 03/21/2022   Prediabetes 03/21/2022   Erectile dysfunction 07/26/2021   Family history of ischemic heart disease (IHD) 07/26/2021   Pure hypercholesterolemia 07/26/2021   History of adenomatous polyp of colon 02/03/2019   Internal hemorrhoids 02/03/2019   Carotid artery stenosis, asymptomatic 09/05/2015   Cellulitis    Hypertension    Hyperlipidemia    Occlusion and stenosis of carotid artery without mention of cerebral infarction 01/01/2012      HPI: Clinton [name] is a 66 y.o. male pvd, htn, hlp, followed by wound clinic for nonhealing right foot wound with recent imaging concerning for potential osteomyelitis  Patient was previously followed by podiatry for same. He noticed some redness/pain/swelling around the ulcer and was given omadocycline by the podiatrist and referred to wound care. He has been taking a couple weeks of omadocycline by the time he saw me  Wound clinic team obtained imaging 8/24 (mri not able to be completed so ct also done). Ct shows possible om of 5th mt  He has had this ulcer for around 2 months. In June he had undergone angioplasty of rle and July of left lower extremities  He does complain if he raises his leg too long the foot started hurting   No fever chill currently. Tolerating omadocycline  He has no prior foot infection    Review of Systems: ROS       Past Medical History:  Diagnosis Date   Carotid artery occlusion    Cellulitis    Erectile dysfunction    Hyperlipidemia    Hypertension     Social History   Tobacco Use   Smoking status: Never   Smokeless tobacco: Former    Types: Chew    Quit date: 01/01/2015  Vaping Use   Vaping Use: Never used  Substance Use  Topics   Alcohol use: Yes    Alcohol/week: 7.0 standard drinks of alcohol    Types: 7 Cans of beer per week    Comment: pt states that he use to have a beer occasionly but has not had one in about one month   Drug use: No    Family History  Problem Relation Age of Onset   Other Mother        circulatory problems   Heart attack Mother    Deep vein thrombosis Mother    Heart disease Mother    Hyperlipidemia Father    Hypertension Father    Diabetes Father    Deep vein thrombosis Father    Heart disease Father        before age 43   Heart attack Father    Peripheral vascular disease Father    Hypertension Brother    Hyperlipidemia Brother     Allergies  Allergen Reactions   Penicillins Rash    Has patient had a PCN reaction causing immediate rash, facial/tongue/throat swelling, SOB or lightheadedness with hypotension: Yes Has patient had a PCN reaction causing severe rash involving mucus membranes or skin necrosis: No Has patient had a PCN reaction that required hospitalization No Has patient had a PCN reaction occurring within the last 10 years: No If all of the above answers are "NO", then may proceed with Cephalosporin use.  Sulfa Antibiotics     Other reaction(s): Unknown    OBJECTIVE: Vitals:   04/08/22 1529  BP: 135/71  Pulse: 82  Temp: 97.8 F (36.6 C)  TempSrc: Oral  Weight: 216 lb (98 kg)  Height: '5\' 10"'  (1.778 m)   Body mass index is 30.99 kg/m.   Physical Exam  General/constitutional: no distress, pleasant HEENT: Normocephalic, PER, Conj Clear, EOMI, Oropharynx clear Neck supple CV: rrr no mrg Lungs: clear to auscultation, normal respiratory effort Abd: Soft, Nontender Ext: no edema Skin: No Rash Neuro: nonfocal MSK: right foot is a 1 inch stage 3-4 ulcer with serosanguinous output. No purulence. No fluctuance. No redness. No tenderness   Lab: Lab Results  Component Value Date   WBC 8.1 09/06/2015   HGB 11.9 (L) 02/18/2022   HCT 35.0  (L) 02/18/2022   MCV 97.0 09/06/2015   PLT 181 82/99/3716   Last metabolic panel Lab Results  Component Value Date   GLUCOSE 98 02/18/2022   NA 138 02/18/2022   K 4.8 02/18/2022   CL 102 02/18/2022   CO2 25 09/06/2015   BUN 18 02/18/2022   CREATININE 1.60 (H) 02/18/2022   GFRNONAA >60 09/06/2015   CALCIUM 8.5 (L) 09/06/2015   PROT 6.0 (L) 08/27/2015   ALBUMIN 3.9 08/27/2015   BILITOT 0.6 08/27/2015   ALKPHOS 51 08/27/2015   AST 22 08/27/2015   ALT 24 08/27/2015   ANIONGAP 10 09/06/2015   No results found for: "CRP"  Microbiology:  Serology:  Imaging: Reviewed and incorporated into decision making imagings findings  8/23 mri foot right without contrast 1. Incomplete examination. Patient unable to tolerate completion of the study. 2. 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 3. Associated deep soft tissue ulceration with periarticular inflammatory changes, but no focal abscess.  8/24 ct with contrast right foot 1. 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 2. Associated deep soft tissue ulceration with surrounding inflammation, but no focal fluid collection or significant joint effusion. 3. Nonspecific generalized soft tissue edema throughout the forefoot which may reflect cellulitis.  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 He'll follow up with vascular surgery and discuss this further    I would rather not  use omadocycline to treat this empirically as resistance development is a concern with improper source control and we do have other antibiotics to address this potentially    -ortho dr Sharol Given referral -continue wound care -hold abx -contact our clinic if sign of soft tissue infection or sepsis -f/u 4 weeks with [place] -stop omadocycline -cbc, cmp, crp/esr today -patient asked how to decrease edema right LE as he can't keep it elevated due to pain... I think he can potentially wrap his leg or discuss with wound care other options   I have spent a total of 65 minutes of face-to-face and non-face-to-face time, excluding clinical staff time, preparing to see patient, ordering tests and/or medications, and provide counseling the patient          Follow-up: Return in about 4 weeks (around 05/06/2022).  Jabier Mutton, [facility] 04/08/2022, 3:41 PM

## 2022-04-08 NOTE — Telephone Encounter (Signed)
Pt called stating that the foot infection has spread to the bone per CT results. He is seeing an ID provider and a wound care provider this week. It was recommended that he make an appt for Dr Myra Gianotti asap to ensure proper blood flow to the foot since measures are to be taken to save toe.  Reviewed pt's chart, returned call for clarification, two identifiers used. Pt offered a [place] and [place] appt for 1100 & 1140 respectively, but unable to secure transportation.  Pt's wife, [name], called and appts were made for first available on 9/11. Confirmed understanding.

## 2022-04-08 NOTE — Patient Instructions (Signed)
You have a chronic foot ulcer. This does put you at risk for bone infection  Imaging does suggest that but this diagnosis is made by biopsy and bone culture   Also if the bone is truly infected, most of the time it will need to be debrided or partially removed as chronically infected bone become necrotic and will not be responding to antibiotics    I have made referral for you to see dr [name] Please stop your antibiotics for now   If redness/increased swelling-purulence/fever chill please let us know so we can start you back on antibiotics  See me in 4 weeks otherwise  Continue to follow wound care

## 2022-04-09 ENCOUNTER — Telehealth: Payer: Self-pay

## 2022-04-09 ENCOUNTER — Ambulatory Visit (INDEPENDENT_AMBULATORY_CARE_PROVIDER_SITE_OTHER): Payer: 59 | Admitting: Family

## 2022-04-09 ENCOUNTER — Encounter: Payer: Self-pay | Admitting: Family

## 2022-04-09 DIAGNOSIS — M86271 Subacute osteomyelitis, right ankle and foot: Secondary | ICD-10-CM | POA: Diagnosis not present

## 2022-04-09 DIAGNOSIS — I739 Peripheral vascular disease, unspecified: Secondary | ICD-10-CM | POA: Diagnosis not present

## 2022-04-09 LAB — CBC WITH DIFFERENTIAL/PLATELET
Absolute Monocytes: 489 cells/uL (ref 200–950)
Basophils Absolute: 47 cells/uL (ref 0–200)
Basophils Relative: 0.7 %
Eosinophils Absolute: 60 cells/uL (ref 15–500)
Eosinophils Relative: 0.9 %
HCT: 38.2 % — ABNORMAL LOW (ref 38.5–50.0)
Hemoglobin: 13.3 g/dL (ref 13.2–17.1)
Lymphs Abs: 1333 cells/uL (ref 850–3900)
MCH: 34 pg — ABNORMAL HIGH (ref 27.0–33.0)
MCHC: 34.8 g/dL (ref 32.0–36.0)
MCV: 97.7 fL (ref 80.0–100.0)
MPV: 10.5 fL (ref 7.5–12.5)
Monocytes Relative: 7.3 %
Neutro Abs: 4770 cells/uL (ref 1500–7800)
Neutrophils Relative %: 71.2 %
Platelets: 362 10*3/uL (ref 140–400)
RBC: 3.91 10*6/uL — ABNORMAL LOW (ref 4.20–5.80)
RDW: 12.1 % (ref 11.0–15.0)
Total Lymphocyte: 19.9 %
WBC: 6.7 10*3/uL (ref 3.8–10.8)

## 2022-04-09 LAB — COMPLETE METABOLIC PANEL WITH GFR
AG Ratio: 2 (calc) (ref 1.0–2.5)
ALT: 24 U/L (ref 9–46)
AST: 16 U/L (ref 10–35)
Albumin: 4.5 g/dL (ref 3.6–5.1)
Alkaline phosphatase (APISO): 64 U/L (ref 35–144)
BUN: 19 mg/dL (ref 7–25)
CO2: 27 mmol/L (ref 20–32)
Calcium: 9.6 mg/dL (ref 8.6–10.3)
Chloride: 102 mmol/L (ref 98–110)
Creat: 1.24 mg/dL (ref 0.70–1.35)
Globulin: 2.2 g/dL (calc) (ref 1.9–3.7)
Glucose, Bld: 88 mg/dL (ref 65–99)
Potassium: 4.3 mmol/L (ref 3.5–5.3)
Sodium: 139 mmol/L (ref 135–146)
Total Bilirubin: 0.5 mg/dL (ref 0.2–1.2)
Total Protein: 6.7 g/dL (ref 6.1–8.1)
eGFR: 64 mL/min/{1.73_m2} (ref >=60)

## 2022-04-09 LAB — C-REACTIVE PROTEIN: CRP: 2.1 mg/L (ref <8.0)

## 2022-04-09 LAB — SEDIMENTATION RATE: Sed Rate: 14 mm/h (ref 0–20)

## 2022-04-09 LAB — POCT I-STAT CREATININE: Creatinine, Ser: 1.4 mg/dL — ABNORMAL HIGH (ref 0.61–1.24)

## 2022-04-09 NOTE — Progress Notes (Signed)
Office Visit Note   Patient: Clinton [name]           Date of Birth: 11-20-1955           MRN: [id number] Visit Date: 04/09/2022              Requested by: Raymondo Band, MD [address] [place],  [place] [address] PCP: Darrow Bussing, MD  Chief Complaint  Patient presents with   Right Foot - Wound Check      HPI: The patient is a 66 year old gentleman who is seen today in referral from infectious diseases.  He is currently under the care of wound care for a chronic ulcer beneath the fifth metatarsal head on the right he has seen podiatry for this for quite some time states that he did have some injections to the area he also has been seen by infectious disease Dr. Renold Don  There is some concern for osteomyelitis of the fifth metatarsal head a CT was performed as the patient could not tolerate the MRI scan.  The CT and the MRI confirmed osteomyelitis of the fifth metatarsal.   The patient is currently continuing on antibiotics orally.  He has an appointment tomorrow with [name].  According to Dr. Estanislado Spire note the patient may require further angiography bilateral lower extremity.  He will meet with [name] tomorrow appreciate his input and surgical planning.  Assessment & Plan: Visit Diagnoses: No diagnosis found.  Plan: Recommended fifth ray amputation complete ray amputation on the right.  Patient aware of recommendations he will meet with [name] tomorrow from the vascular standpoint to discuss wound healing.  He will call us and let us know how he is doing.  Whether he would like to proceed with amputation next week.  Discussed return precautions at length.  Follow-Up Instructions: No follow-ups on file.   Ortho Exam  Patient is alert, oriented, no adenopathy, well-dressed, normal affect, normal respiratory effort. On examination of the right foot beneath the fifth metatarsal head there is a Wagner ulcer this does probe to bone.  Wound is 15 mm in  diameter there is necrotic tissue in the wound bed there is no active drainage no surrounding erythema no warmth  Imaging: No results found. No images are attached to the encounter.  Labs: Lab Results  Component Value Date   ESRSEDRATE 14 04/08/2022   CRP 2.1 04/08/2022     Lab Results  Component Value Date   ALBUMIN 3.9 08/27/2015    No results found for: "MG" No results found for: "VD25OH"  No results found for: "PREALBUMIN"    Latest Ref Rng & Units 04/08/2022    4:21 PM 02/18/2022   10:37 AM 02/18/2022    8:54 AM  CBC EXTENDED  WBC 3.8 - 10.8 Thousand/uL 6.7     RBC 4.20 - 5.80 Million/uL 3.91     Hemoglobin 13.2 - 17.1 g/dL 88.5  02.7  74.1   HCT 38.5 - 50.0 % 38.2  35.0  35.0   Platelets 140 - 400 Thousand/uL 362     NEUT# 1,500 - 7,800 cells/uL 4,770     Lymph# 850 - 3,900 cells/uL 1,333        There is no height or weight on file to calculate BMI.  Orders:  No orders of the defined types were placed in this encounter.  No orders of the defined types were placed in this encounter.    Procedures: No procedures performed  Clinical Data: No additional findings.  ROS:  All other systems negative, except as noted in the HPI. Review of Systems  Objective: Vital Signs: There were no vitals taken for this visit.  Specialty Comments:  No specialty comments available.  PMFS History: Patient Active Problem List   Diagnosis Date Noted   Peripheral arterial disease (HCC) 03/25/2022   Obesity 03/21/2022   Prediabetes 03/21/2022   Erectile dysfunction 07/26/2021   Family history of ischemic heart disease (IHD) 07/26/2021   Pure hypercholesterolemia 07/26/2021   History of adenomatous polyp of colon 02/03/2019   Internal hemorrhoids 02/03/2019   Carotid artery stenosis, asymptomatic 09/05/2015   Cellulitis    Hypertension    Hyperlipidemia    Occlusion and stenosis of carotid artery without mention of cerebral infarction 01/01/2012   Past Medical  History:  Diagnosis Date   Carotid artery occlusion    Cellulitis    Erectile dysfunction    Hyperlipidemia    Hypertension     Family History  Problem Relation Age of Onset   Other Mother        circulatory problems   Heart attack Mother    Deep vein thrombosis Mother    Heart disease Mother    Hyperlipidemia Father    Hypertension Father    Diabetes Father    Deep vein thrombosis Father    Heart disease Father        before age 3   Heart attack Father    Peripheral vascular disease Father    Hypertension Brother    Hyperlipidemia Brother     Past Surgical History:  Procedure Laterality Date   ABDOMINAL AORTOGRAM W/LOWER EXTREMITY N/A 02/04/2022   Procedure: ABDOMINAL AORTOGRAM W/LOWER EXTREMITY;  Surgeon: Nada Libman, MD;  Location: [facility];  Service: Cardiovascular;  Laterality: N/A;   ABDOMINAL AORTOGRAM W/LOWER EXTREMITY N/A 02/18/2022   Procedure: ABDOMINAL AORTOGRAM W/LOWER EXTREMITY;  Surgeon: Nada Libman, MD;  Location: [facility];  Service: Cardiovascular;  Laterality: N/A;   COLONOSCOPY     ENDARTERECTOMY Right 09/05/2015   Procedure: ENDARTERECTOMY CAROTID RIGHT;  Surgeon: Sherren Kerns, MD;  Location: [facility] OR;  Service: Vascular;  Laterality: Right;   PATCH ANGIOPLASTY  09/05/2015   Procedure: PATCH ANGIOPLASTY RIGHT CAROTID ARTERY USING HEMASHIELD PLATINUM FINESSE PATCH;  Surgeon: Sherren Kerns, MD;  Location: [facility] OR;  Service: Vascular;;   PERIPHERAL VASCULAR ATHERECTOMY Left 02/18/2022   Procedure: PERIPHERAL VASCULAR ATHERECTOMY;  Surgeon: Nada Libman, MD;  Location: [facility];  Service: Cardiovascular;  Laterality: Left;  Popliteal and Peroneal   PERIPHERAL VASCULAR BALLOON ANGIOPLASTY  02/04/2022   Procedure: PERIPHERAL VASCULAR BALLOON ANGIOPLASTY;  Surgeon: Nada Libman, MD;  Location: [facility];  Service: Cardiovascular;;   TONSILLECTOMY     VASECTOMY     Social History   Occupational History    Not on file  Tobacco Use   Smoking status: Never   Smokeless tobacco: Former    Types: Chew    Quit date: 01/01/2015  Vaping Use   Vaping Use: Never used  Substance and Sexual Activity   Alcohol use: Yes    Alcohol/week: 7.0 standard drinks of alcohol    Types: 7 Cans of beer per week    Comment: pt states that he use to have a beer occasionly but has not had one in about one month   Drug use: No   Sexual activity: Not on file

## 2022-04-09 NOTE — Telephone Encounter (Signed)
Called pt to offer earlier appt since Dr Myra Gianotti had procedure cancellations for 8/31. Pt accepted and appts moved.

## 2022-04-10 ENCOUNTER — Ambulatory Visit (INDEPENDENT_AMBULATORY_CARE_PROVIDER_SITE_OTHER)
Admission: RE | Admit: 2022-04-10 | Discharge: 2022-04-10 | Disposition: A | Discharge status: Home / Self Care | Payer: 59 | Source: Ambulatory Visit

## 2022-04-10 ENCOUNTER — Other Ambulatory Visit: Payer: Self-pay | Admitting: *Deleted

## 2022-04-10 ENCOUNTER — Ambulatory Visit (INDEPENDENT_AMBULATORY_CARE_PROVIDER_SITE_OTHER): Payer: 59 | Admitting: Surgery

## 2022-04-10 ENCOUNTER — Other Ambulatory Visit: Payer: Self-pay

## 2022-04-10 ENCOUNTER — Ambulatory Visit (HOSPITAL_COMMUNITY)
Admission: RE | Admit: 2022-04-10 | Discharge: 2022-04-10 | Disposition: A | Discharge status: Home / Self Care | Payer: 59 | Source: Ambulatory Visit | Attending: Surgery | Admitting: Surgery

## 2022-04-10 ENCOUNTER — Encounter: Payer: Self-pay | Admitting: Surgery

## 2022-04-10 VITALS — BP 148/86 | HR 60 | Temp 98.2°F | Resp 18 | Ht 70.0 in | Wt 215.0 lb

## 2022-04-10 DIAGNOSIS — S91309A Unspecified open wound, unspecified foot, initial encounter: Secondary | ICD-10-CM | POA: Diagnosis present

## 2022-04-10 DIAGNOSIS — I7025 Atherosclerosis of native arteries of other extremities with ulceration: Secondary | ICD-10-CM | POA: Diagnosis not present

## 2022-04-10 NOTE — Progress Notes (Signed)
[facility]  Patient name: Clinton [name] MRN: [id number] DOB: [birth year] Sex: male   REASON FOR VISIT:    Follow up  HISOTRY OF PRESENT ILLNESS:      Clinton [name] is a 66 y.o. male with a history of bilateral lower extremity ulcers since the beginning of the summer.  On 02/04/2022 he underwent angiography.  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  He had diffuse disease out onto the foot.  On 02/18/2022 he had repeat angiography this time of the left leg.  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  These were treated with drug-coated balloon angioplasty.  There was a residual stenosis in the distal popliteal artery and origin of the tibioperoneal trunk which was treated with CSI atherectomy.  He again had diffuse disease out onto the foot.  He is back today for follow-up with osteomyelitis in his right fifth toe and is scheduled for fifth toe amputation by Dr. Sharol Given   The patient underwent right-sided carotid endarterectomy on 09/05/2015 for asymptomatic stenosis by Dr. Oneida Alar.  His last ultrasound showed no recurrence with no significant bilateral stenosis.  He is also undergone evaluation for a aneurysm.  His ultrasound was negative for aortic enlargement.  He takes a statin for hypercholesterolemia.  He is medically managed for hypertension.   PAST MEDICAL HISTORY:   Past Medical History:  Diagnosis Date   Carotid artery occlusion    Cellulitis    Erectile dysfunction    Hyperlipidemia    Hypertension      FAMILY HISTORY:   Family History  Problem Relation Age of Onset   Other Mother        circulatory problems   Heart attack Mother    Deep vein thrombosis Mother    Heart disease Mother     Hyperlipidemia Father    Hypertension Father    Diabetes Father    Deep vein thrombosis Father    Heart disease Father        before age 15   Heart attack Father    Peripheral vascular disease Father    Hypertension Brother    Hyperlipidemia Brother     SOCIAL HISTORY:   Social History   Tobacco Use   Smoking status: Never   Smokeless tobacco: Former    Types: Chew    Quit date: 01/01/2015  Substance Use Topics   Alcohol use: Yes    Alcohol/week: 7.0 standard drinks of alcohol    Types: 7 Cans of beer per week    Comment: pt states that he use to have a beer occasionly but has not had one in about one month     ALLERGIES:   Allergies  Allergen Reactions   Penicillins Rash    Has patient had a PCN reaction causing immediate rash, facial/tongue/throat swelling, SOB or lightheadedness with hypotension: Yes Has patient had a PCN reaction causing severe rash involving mucus membranes or skin necrosis: No Has patient had a PCN reaction that required hospitalization No Has patient had a PCN reaction occurring within the last 10 years: No If all of the above answers are "NO", then may proceed with Cephalosporin use.    Sulfa Antibiotics     Other reaction(s): Unknown     CURRENT MEDICATIONS:   Current Outpatient  Medications  Medication Sig Dispense Refill   acetaminophen (TYLENOL) 500 MG tablet Take 1,000 mg by mouth every 6 (six) hours as needed for moderate pain or mild pain.     amLODipine (NORVASC) 2.5 MG tablet Take 2.5 mg by mouth at bedtime.     aspirin 81 MG tablet Take 81 mg by mouth at bedtime.     atorvastatin (LIPITOR) 40 MG tablet Take 40 mg by mouth at bedtime.     clopidogrel (PLAVIX) 75 MG tablet Take 1 tablet (75 mg total) by mouth daily. (Patient taking differently: Take 75 mg by mouth at bedtime.) 30 tablet 11   irbesartan-hydrochlorothiazide (AVALIDE) 300-12.5 MG tablet Take 1 tablet by mouth at bedtime.     Multiple Vitamin (MULTIVITAMIN) capsule  Take 2 capsules by mouth at bedtime. Gummy     mupirocin ointment (BACTROBAN) 2 % Apply 1 Application topically daily.     sildenafil (VIAGRA) 100 MG tablet Take 100 mg by mouth as needed for erectile dysfunction.     NUZYRA 150 MG TABS Take by mouth. (Patient not taking: Reported on 04/10/2022)     No current facility-administered medications for this visit.    REVIEW OF SYSTEMS:   [X]  denotes positive finding, [ ]  denotes negative finding Cardiac  Comments:  Chest pain or chest pressure:    Shortness of breath upon exertion:    Short of breath when lying flat:    Irregular heart rhythm:        Vascular    Pain in calf, thigh, or hip brought on by ambulation:    Pain in feet at night that wakes you up from your sleep:     Blood clot in your veins:    Leg swelling:         Pulmonary    Oxygen at home:    Productive cough:     Wheezing:         Neurologic    Sudden weakness in arms or legs:     Sudden numbness in arms or legs:     Sudden onset of difficulty speaking or slurred speech:    Temporary loss of vision in one eye:     Problems with dizziness:         Gastrointestinal    Blood in stool:     Vomited blood:         Genitourinary    Burning when urinating:     Blood in urine:        Psychiatric    Major depression:         Hematologic    Bleeding problems:    Problems with blood clotting too easily:        Skin    Rashes or ulcers:        Constitutional    Fever or chills:      PHYSICAL EXAM:   Vitals:   04/10/22 0843  BP: (!) 148/86  Pulse: 60  Resp: 18  Temp: 98.2 F (36.8 C)  TempSrc: Temporal  SpO2: 99%  Weight: 215 lb (97.5 kg)  Height: 5\' 10"  (1.778 m)    GENERAL: The patient is a well-nourished male, in no acute distress. The vital signs are documented above. CARDIAC: There is a regular rate and rhythm.  VASCULAR: Nonpalpable pedal pulses PULMONARY: Non-labored respirations ABDOMEN: Soft and non-tender with normal pitched bowel  sounds.  MUSCULOSKELETAL: Right fifth toe and metatarsal head ulcer NEUROLOGIC: No focal weakness or paresthesias are detected. SKIN:  There are no ulcers or rashes noted. PSYCHIATRIC: The patient has a normal affect.  STUDIES:   I have reviewed his ultrasound from today that shows elevated velocities within his popliteal artery and biphasic to monophasic waveforms  MEDICAL ISSUES:   Osteomyelitis, right fifth toe: I discussed with the patient and his wife that he has diffuse disease in his right leg which has been intervened on and I feel that there has been some form of a recurrence.  Since he is scheduled to have toe amputation next week I think we need to make sure that his blood flow is optimized.  I have scheduled him for angiography on next Tuesday, September 5 via left femoral approach.  I will study [name] legs and plan on intervention in the right leg, most likely atherectomy of the popliteal artery with angioplasty as well as treatment of the anterior tibial artery.  If he can have his surgery scheduled for Wednesday with Dr. [name], I will plan on admitting him overnight after his angiogram    Durene Cal, IV, MD, FACS [facility] [phone] Pager [phone]

## 2022-04-10 NOTE — H&P (View-Only) (Signed)
 [facility] [place]  Patient name: Clinton [name] MRN: [id number] DOB: 01/15/1956 Sex: male   REASON FOR VISIT:    Follow up  HISOTRY OF PRESENT ILLNESS:      Clinton [name] is a 66 y.o. male with a history of bilateral lower extremity ulcers since the beginning of the summer.  On 02/04/2022 he underwent angiography.  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  He had diffuse disease out onto the foot.  On 02/18/2022 he had repeat angiography this time of the left leg.  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  These were treated with drug-coated balloon angioplasty.  There was a residual stenosis in the distal popliteal artery and origin of the tibioperoneal trunk which was treated with CSI atherectomy.  He again had diffuse disease out onto the foot.  He is back today for follow-up with osteomyelitis in his right fifth toe and is scheduled for fifth toe amputation by Dr. Duda   The patient underwent right-sided carotid endarterectomy on 09/05/2015 for asymptomatic stenosis by Dr. Fields.  His last ultrasound showed no recurrence with no significant bilateral stenosis.  He is also undergone evaluation for a aneurysm.  His ultrasound was negative for aortic enlargement.  He takes a statin for hypercholesterolemia.  He is medically managed for hypertension.   PAST MEDICAL HISTORY:   Past Medical History:  Diagnosis Date   Carotid artery occlusion    Cellulitis    Erectile dysfunction    Hyperlipidemia    Hypertension      FAMILY HISTORY:   Family History  Problem Relation Age of Onset   Other Mother        circulatory problems   Heart attack Mother    Deep vein thrombosis Mother    Heart disease Mother     Hyperlipidemia Father    Hypertension Father    Diabetes Father    Deep vein thrombosis Father    Heart disease Father        before age 60   Heart attack Father    Peripheral vascular disease Father    Hypertension Brother    Hyperlipidemia Brother     SOCIAL HISTORY:   Social History   Tobacco Use   Smoking status: Never   Smokeless tobacco: Former    Types: Chew    Quit date: 01/01/2015  Substance Use Topics   Alcohol use: Yes    Alcohol/week: 7.0 standard drinks of alcohol    Types: 7 Cans of beer per week    Comment: pt states that he use to have a beer occasionly but has not had one in about one month     ALLERGIES:   Allergies  Allergen Reactions   Penicillins Rash    Has patient had a PCN reaction causing immediate rash, facial/tongue/throat swelling, SOB or lightheadedness with hypotension: Yes Has patient had a PCN reaction causing severe rash involving mucus membranes or skin necrosis: No Has patient had a PCN reaction that required hospitalization No Has patient had a PCN reaction occurring within the last 10 years: No If all of the above answers are "NO", then may proceed with Cephalosporin use.    Sulfa Antibiotics     Other reaction(s): Unknown     CURRENT MEDICATIONS:   Current Outpatient   Medications  Medication Sig Dispense Refill   acetaminophen (TYLENOL) 500 MG tablet Take 1,000 mg by mouth every 6 (six) hours as needed for moderate pain or mild pain.     amLODipine (NORVASC) 2.5 MG tablet Take 2.5 mg by mouth at bedtime.     aspirin 81 MG tablet Take 81 mg by mouth at bedtime.     atorvastatin (LIPITOR) 40 MG tablet Take 40 mg by mouth at bedtime.     clopidogrel (PLAVIX) 75 MG tablet Take 1 tablet (75 mg total) by mouth daily. (Patient taking differently: Take 75 mg by mouth at bedtime.) 30 tablet 11   irbesartan-hydrochlorothiazide (AVALIDE) 300-12.5 MG tablet Take 1 tablet by mouth at bedtime.     Multiple Vitamin (MULTIVITAMIN) capsule  Take 2 capsules by mouth at bedtime. Gummy     mupirocin ointment (BACTROBAN) 2 % Apply 1 Application topically daily.     sildenafil (VIAGRA) 100 MG tablet Take 100 mg by mouth as needed for erectile dysfunction.     NUZYRA 150 MG TABS Take by mouth. (Patient not taking: Reported on 04/10/2022)     No current facility-administered medications for this visit.    REVIEW OF SYSTEMS:   [X]  denotes positive finding, [ ]  denotes negative finding Cardiac  Comments:  Chest pain or chest pressure:    Shortness of breath upon exertion:    Short of breath when lying flat:    Irregular heart rhythm:        Vascular    Pain in calf, thigh, or hip brought on by ambulation:    Pain in feet at night that wakes you up from your sleep:     Blood clot in your veins:    Leg swelling:         Pulmonary    Oxygen at home:    Productive cough:     Wheezing:         Neurologic    Sudden weakness in arms or legs:     Sudden numbness in arms or legs:     Sudden onset of difficulty speaking or slurred speech:    Temporary loss of vision in one eye:     Problems with dizziness:         Gastrointestinal    Blood in stool:     Vomited blood:         Genitourinary    Burning when urinating:     Blood in urine:        Psychiatric    Major depression:         Hematologic    Bleeding problems:    Problems with blood clotting too easily:        Skin    Rashes or ulcers:        Constitutional    Fever or chills:      PHYSICAL EXAM:   Vitals:   04/10/22 0843  BP: (!) 148/86  Pulse: 60  Resp: 18  Temp: 98.2 F (36.8 C)  TempSrc: Temporal  SpO2: 99%  Weight: 215 lb (97.5 kg)  Height: 5\' 10"  (1.778 m)    GENERAL: The patient is a well-nourished male, in no acute distress. The vital signs are documented above. CARDIAC: There is a regular rate and rhythm.  VASCULAR: Nonpalpable pedal pulses PULMONARY: Non-labored respirations ABDOMEN: Soft and non-tender with normal pitched bowel  sounds.  MUSCULOSKELETAL: Right fifth toe and metatarsal head ulcer NEUROLOGIC: No focal weakness or paresthesias are detected. SKIN:  There are no ulcers or rashes noted. PSYCHIATRIC: The patient has a normal affect.  STUDIES:   I have reviewed his ultrasound from today that shows elevated velocities within his popliteal artery and biphasic to monophasic waveforms  MEDICAL ISSUES:   Osteomyelitis, right fifth toe: I discussed with the patient and his wife that he has diffuse disease in his right leg which has been intervened on and I feel that there has been some form of a recurrence.  Since he is scheduled to have toe amputation next week I think we need to make sure that his blood flow is optimized.  I have scheduled him for angiography on next Tuesday, September 5 via left femoral approach.  I will study both legs and plan on intervention in the right leg, most likely atherectomy of the popliteal artery with angioplasty as well as treatment of the anterior tibial artery.  If he can have his surgery scheduled for Wednesday with Dr. [name], I will plan on admitting him overnight after his angiogram    Durene Cal, IV, MD, FACS [facility] [phone] Pager [phone]

## 2022-04-11 ENCOUNTER — Encounter (HOSPITAL_BASED_OUTPATIENT_CLINIC_OR_DEPARTMENT_OTHER): Payer: 59 | Attending: Internal Medicine | Admitting: Internal Medicine

## 2022-04-11 DIAGNOSIS — Z882 Allergy status to sulfonamides status: Secondary | ICD-10-CM | POA: Insufficient documentation

## 2022-04-11 DIAGNOSIS — L97512 Non-pressure chronic ulcer of other part of right foot with fat layer exposed: Secondary | ICD-10-CM | POA: Diagnosis not present

## 2022-04-11 DIAGNOSIS — R52 Pain, unspecified: Secondary | ICD-10-CM

## 2022-04-11 DIAGNOSIS — M86671 Other chronic osteomyelitis, right ankle and foot: Secondary | ICD-10-CM | POA: Insufficient documentation

## 2022-04-11 DIAGNOSIS — Z880 Allergy status to penicillin: Secondary | ICD-10-CM | POA: Diagnosis not present

## 2022-04-11 DIAGNOSIS — I70235 Atherosclerosis of native arteries of right leg with ulceration of other part of foot: Secondary | ICD-10-CM | POA: Diagnosis not present

## 2022-04-11 DIAGNOSIS — I739 Peripheral vascular disease, unspecified: Secondary | ICD-10-CM | POA: Diagnosis not present

## 2022-04-11 NOTE — Progress Notes (Signed)
Informed consent order included non-approved abbreviation. Contacted office and received verbal from Juliette CMA to change.

## 2022-04-15 ENCOUNTER — Other Ambulatory Visit (HOSPITAL_BASED_OUTPATIENT_CLINIC_OR_DEPARTMENT_OTHER): Payer: 59

## 2022-04-15 ENCOUNTER — Inpatient Hospital Stay (HOSPITAL_COMMUNITY)
Admission: RE | Admit: 2022-04-15 | Discharge: 2022-04-22 | DRG: 240 | Disposition: A | Discharge status: Home Health | Payer: 59 | Attending: Surgery | Admitting: Surgery

## 2022-04-15 ENCOUNTER — Encounter (HOSPITAL_COMMUNITY)
Admission: RE | Disposition: A | Discharge status: Home Health | Payer: Self-pay | Source: Home / Self Care | Attending: Surgery

## 2022-04-15 ENCOUNTER — Other Ambulatory Visit: Payer: Self-pay

## 2022-04-15 DIAGNOSIS — I70235 Atherosclerosis of native arteries of right leg with ulceration of other part of foot: Principal | ICD-10-CM | POA: Diagnosis present

## 2022-04-15 DIAGNOSIS — Z880 Allergy status to penicillin: Secondary | ICD-10-CM

## 2022-04-15 DIAGNOSIS — Z8249 Family history of ischemic heart disease and other diseases of the circulatory system: Secondary | ICD-10-CM

## 2022-04-15 DIAGNOSIS — Z7902 Long term (current) use of antithrombotics/antiplatelets: Secondary | ICD-10-CM

## 2022-04-15 DIAGNOSIS — Z83438 Family history of other disorder of lipoprotein metabolism and other lipidemia: Secondary | ICD-10-CM

## 2022-04-15 DIAGNOSIS — E7800 Pure hypercholesterolemia, unspecified: Secondary | ICD-10-CM | POA: Diagnosis present

## 2022-04-15 DIAGNOSIS — Z87891 Personal history of nicotine dependence: Secondary | ICD-10-CM

## 2022-04-15 DIAGNOSIS — I739 Peripheral vascular disease, unspecified: Principal | ICD-10-CM

## 2022-04-15 DIAGNOSIS — I10 Essential (primary) hypertension: Secondary | ICD-10-CM | POA: Diagnosis present

## 2022-04-15 DIAGNOSIS — M868X7 Other osteomyelitis, ankle and foot: Secondary | ICD-10-CM | POA: Diagnosis present

## 2022-04-15 DIAGNOSIS — Z882 Allergy status to sulfonamides status: Secondary | ICD-10-CM

## 2022-04-15 DIAGNOSIS — Z7982 Long term (current) use of aspirin: Secondary | ICD-10-CM

## 2022-04-15 DIAGNOSIS — L97519 Non-pressure chronic ulcer of other part of right foot with unspecified severity: Secondary | ICD-10-CM | POA: Diagnosis present

## 2022-04-15 DIAGNOSIS — I7025 Atherosclerosis of native arteries of other extremities with ulceration: Secondary | ICD-10-CM

## 2022-04-15 DIAGNOSIS — M86171 Other acute osteomyelitis, right ankle and foot: Secondary | ICD-10-CM

## 2022-04-15 DIAGNOSIS — I70248 Atherosclerosis of native arteries of left leg with ulceration of other part of lower left leg: Secondary | ICD-10-CM | POA: Diagnosis present

## 2022-04-15 DIAGNOSIS — Z79899 Other long term (current) drug therapy: Secondary | ICD-10-CM

## 2022-04-15 DIAGNOSIS — L97929 Non-pressure chronic ulcer of unspecified part of left lower leg with unspecified severity: Secondary | ICD-10-CM | POA: Diagnosis present

## 2022-04-15 DIAGNOSIS — N529 Male erectile dysfunction, unspecified: Secondary | ICD-10-CM | POA: Diagnosis present

## 2022-04-15 DIAGNOSIS — I872 Venous insufficiency (chronic) (peripheral): Secondary | ICD-10-CM | POA: Diagnosis present

## 2022-04-15 HISTORY — PX: ABDOMINAL AORTOGRAM W/LOWER EXTREMITY: CATH118223

## 2022-04-15 HISTORY — PX: PERIPHERAL VASCULAR BALLOON ANGIOPLASTY: CATH118281

## 2022-04-15 LAB — POCT ACTIVATED CLOTTING TIME: Activated Clotting Time: 239 seconds

## 2022-04-15 LAB — POCT I-STAT, CHEM 8
BUN: 19 mg/dL (ref 8–23)
Calcium, Ion: 1.24 mmol/L (ref 1.15–1.40)
Chloride: 104 mmol/L (ref 98–111)
Creatinine, Ser: 1.2 mg/dL (ref 0.61–1.24)
Glucose, Bld: 103 mg/dL — ABNORMAL HIGH (ref 70–99)
HCT: 38 % — ABNORMAL LOW (ref 39.0–52.0)
Hemoglobin: 12.9 g/dL — ABNORMAL LOW (ref 13.0–17.0)
Potassium: 5.3 mmol/L — ABNORMAL HIGH (ref 3.5–5.1)
Sodium: 139 mmol/L (ref 135–145)
TCO2: 27 mmol/L (ref 22–32)

## 2022-04-15 LAB — GLUCOSE, CAPILLARY
Glucose-Capillary: 127 mg/dL — ABNORMAL HIGH (ref 70–99)
Glucose-Capillary: 85 mg/dL (ref 70–99)

## 2022-04-15 LAB — HEMOGLOBIN A1C
Hgb A1c MFr Bld: 5.4 % (ref 4.8–5.6)
Mean Plasma Glucose: 108.28 mg/dL

## 2022-04-15 SURGERY — ABDOMINAL AORTOGRAM W/LOWER EXTREMITY
Anesthesia: LOCAL | Laterality: Right

## 2022-04-15 MED ORDER — INSULIN ASPART 100 UNIT/ML IJ SOLN
0.0000 [IU] | Freq: Three times a day (TID) | INTRAMUSCULAR | Status: DC
  Administered 2022-04-15 – 2022-04-18 (×3): 2 [IU] via SUBCUTANEOUS

## 2022-04-15 MED ORDER — SODIUM CHLORIDE 0.9% FLUSH
3.0000 mL | Freq: Two times a day (BID) | INTRAVENOUS | Status: DC
  Administered 2022-04-15 – 2022-04-20 (×10): 3 mL via INTRAVENOUS

## 2022-04-15 MED ORDER — ONDANSETRON HCL 4 MG/2ML IJ SOLN
4.0000 mg | Freq: Four times a day (QID) | INTRAMUSCULAR | Status: DC | PRN
Start: 2022-04-15 — End: 2022-04-17

## 2022-04-15 MED ORDER — SODIUM CHLORIDE 0.9 % IV SOLN: INTRAVENOUS | Status: DC

## 2022-04-15 MED ORDER — LIDOCAINE HCL (PF) 1 % IJ SOLN
INTRAMUSCULAR | Status: AC
  Filled 2022-04-15: qty 30

## 2022-04-15 MED ORDER — MORPHINE SULFATE (PF) 2 MG/ML IV SOLN
2.0000 mg | INTRAVENOUS | Status: DC | PRN
  Administered 2022-04-16 (×3): 2 mg via INTRAVENOUS
  Filled 2022-04-15 (×3): qty 1

## 2022-04-15 MED ORDER — SODIUM CHLORIDE 0.9 % WEIGHT BASED INFUSION: 1.0000 mL/kg/h | INTRAVENOUS | Status: AC

## 2022-04-15 MED ORDER — AMLODIPINE BESYLATE 5 MG PO TABS
2.5000 mg | ORAL_TABLET | Freq: Every day | ORAL | Status: DC
  Administered 2022-04-15 – 2022-04-21 (×7): 2.5 mg via ORAL
  Filled 2022-04-15 (×7): qty 1

## 2022-04-15 MED ORDER — SODIUM CHLORIDE 0.9 % IV SOLN
250.0000 mL | INTRAVENOUS | Status: DC | PRN
Start: 2022-04-15 — End: 2022-04-22

## 2022-04-15 MED ORDER — LABETALOL HCL 5 MG/ML IV SOLN: 10.0000 mg | INTRAVENOUS | Status: DC | PRN

## 2022-04-15 MED ORDER — TAB-A-VITE/IRON PO TABS
2.0000 | ORAL_TABLET | Freq: Every day | ORAL | Status: DC
  Administered 2022-04-15 – 2022-04-21 (×7): 2 via ORAL
  Filled 2022-04-15 (×7): qty 2

## 2022-04-15 MED ORDER — FENTANYL CITRATE (PF) 100 MCG/2ML IJ SOLN
INTRAMUSCULAR | Status: DC | PRN
Start: 2022-04-15 — End: 2022-04-15
  Administered 2022-04-15 (×2): 25 ug via INTRAVENOUS
  Administered 2022-04-15: 50 ug via INTRAVENOUS
  Administered 2022-04-15: 25 ug via INTRAVENOUS

## 2022-04-15 MED ORDER — HEPARIN (PORCINE) IN NACL 1000-0.9 UT/500ML-% IV SOLN
INTRAVENOUS | Status: DC | PRN
  Administered 2022-04-15 (×2): 500 mL

## 2022-04-15 MED ORDER — HEPARIN (PORCINE) IN NACL 1000-0.9 UT/500ML-% IV SOLN
INTRAVENOUS | Status: AC
  Filled 2022-04-15: qty 1000

## 2022-04-15 MED ORDER — CEFAZOLIN SODIUM-DEXTROSE 2-4 GM/100ML-% IV SOLN
2.0000 g | INTRAVENOUS | Status: AC
  Administered 2022-04-16: 2 g via INTRAVENOUS
  Filled 2022-04-15: qty 100

## 2022-04-15 MED ORDER — IODIXANOL 320 MG/ML IV SOLN
INTRAVENOUS | Status: DC | PRN
  Administered 2022-04-15: 150 mL

## 2022-04-15 MED ORDER — FENTANYL CITRATE (PF) 100 MCG/2ML IJ SOLN
INTRAMUSCULAR | Status: AC
  Filled 2022-04-15: qty 2

## 2022-04-15 MED ORDER — LIDOCAINE HCL (PF) 1 % IJ SOLN
INTRAMUSCULAR | Status: DC | PRN
  Administered 2022-04-15: 12 mL

## 2022-04-15 MED ORDER — OXYCODONE HCL 5 MG PO TABS
5.0000 mg | ORAL_TABLET | ORAL | Status: DC | PRN
  Administered 2022-04-15 – 2022-04-17 (×7): 10 mg via ORAL
  Filled 2022-04-15 (×8): qty 2

## 2022-04-15 MED ORDER — CLOPIDOGREL BISULFATE 75 MG PO TABS: 75.0000 mg | ORAL_TABLET | Freq: Every day | ORAL | Status: DC

## 2022-04-15 MED ORDER — ASPIRIN 81 MG PO TBEC
81.0000 mg | DELAYED_RELEASE_TABLET | Freq: Every day | ORAL | Status: DC
  Administered 2022-04-15 – 2022-04-22 (×8): 81 mg via ORAL
  Filled 2022-04-15 (×7): qty 1

## 2022-04-15 MED ORDER — HEPARIN SODIUM (PORCINE) 1000 UNIT/ML IJ SOLN
INTRAMUSCULAR | Status: DC | PRN
  Administered 2022-04-15: 10000 [IU] via INTRAVENOUS

## 2022-04-15 MED ORDER — ACETAMINOPHEN 500 MG PO TABS: 1000.0000 mg | ORAL_TABLET | Freq: Four times a day (QID) | ORAL | Status: DC | PRN

## 2022-04-15 MED ORDER — ACETAMINOPHEN 325 MG PO TABS
650.0000 mg | ORAL_TABLET | ORAL | Status: DC | PRN
  Administered 2022-04-15: 650 mg via ORAL

## 2022-04-15 MED ORDER — ASPIRIN 81 MG PO CHEW
CHEWABLE_TABLET | ORAL | Status: AC
  Filled 2022-04-15: qty 1

## 2022-04-15 MED ORDER — SODIUM CHLORIDE 0.9% FLUSH: 3.0000 mL | INTRAVENOUS | Status: DC | PRN

## 2022-04-15 MED ORDER — CLOPIDOGREL BISULFATE 75 MG PO TABS
75.0000 mg | ORAL_TABLET | Freq: Every day | ORAL | Status: DC
  Administered 2022-04-15 – 2022-04-22 (×8): 75 mg via ORAL
  Filled 2022-04-15 (×8): qty 1

## 2022-04-15 MED ORDER — HYDRALAZINE HCL 20 MG/ML IJ SOLN: 5.0000 mg | INTRAMUSCULAR | Status: DC | PRN

## 2022-04-15 MED ORDER — ACETAMINOPHEN 325 MG PO TABS
ORAL_TABLET | ORAL | Status: AC
Start: 2022-04-15 — End: ?
  Filled 2022-04-15: qty 2

## 2022-04-15 MED ORDER — ATORVASTATIN CALCIUM 40 MG PO TABS
40.0000 mg | ORAL_TABLET | Freq: Every day | ORAL | Status: DC
  Administered 2022-04-15 – 2022-04-21 (×7): 40 mg via ORAL
  Filled 2022-04-15 (×7): qty 1

## 2022-04-15 MED ORDER — INSULIN ASPART 100 UNIT/ML IJ SOLN: 0.0000 [IU] | Freq: Every day | INTRAMUSCULAR | Status: DC

## 2022-04-15 MED ORDER — MIDAZOLAM HCL 2 MG/2ML IJ SOLN
INTRAMUSCULAR | Status: AC
  Filled 2022-04-15: qty 2

## 2022-04-15 MED ORDER — MIDAZOLAM HCL 2 MG/2ML IJ SOLN
INTRAMUSCULAR | Status: DC | PRN
  Administered 2022-04-15: 2 mg via INTRAVENOUS
  Administered 2022-04-15 (×3): 1 mg via INTRAVENOUS

## 2022-04-15 SURGICAL SUPPLY — 19 items
BAG SNAP BAND KOVER 36X36 (MISCELLANEOUS) IMPLANT
BALL STERLING OTW 2.5X100X150 (BALLOONS) ×2
BALLN STERLING OTW 2.5X100X150 (BALLOONS) ×2
BALLN STERLING OTW 3X100X150 (BALLOONS) ×2
BALLOON STERLING OTW 3X100X150 (BALLOONS) IMPLANT
BALLOON STRLNG OTW 2.5X100X150 (BALLOONS) IMPLANT
CATH OMNI FLUSH 5F 65CM (CATHETERS) IMPLANT
DEVICE VASC CLSR CELT ART 5 (Vascular Products) IMPLANT
KIT ENCORE 26 ADVANTAGE (KITS) IMPLANT
KIT MICROPUNCTURE NIT STIFF (SHEATH) IMPLANT
KIT PV (KITS) ×3 IMPLANT
SHEATH DESTINATION MP 5FR 45CM (SHEATH) IMPLANT
SHEATH PINNACLE 5F 10CM (SHEATH) IMPLANT
SHEATH PROBE COVER 6X72 (BAG) IMPLANT
SYR MEDRAD MARK V 150ML (SYRINGE) IMPLANT
TRANSDUCER W/STOPCOCK (MISCELLANEOUS) ×3 IMPLANT
TRAY PV CATH (CUSTOM PROCEDURE TRAY) ×3 IMPLANT
WIRE BENTSON .035X145CM (WIRE) IMPLANT
WIRE G V18X300CM (WIRE) IMPLANT

## 2022-04-15 NOTE — Interval H&P Note (Signed)
History and Physical Interval Note:  04/15/2022 8:33 AM  Clinton [name]  has presented today for surgery, with the diagnosis of atherosclerosis of native arteries of the extremities with ulceration.  The various methods of treatment have been discussed with the patient and family. After consideration of risks, benefits and other options for treatment, the patient has consented to  Procedure(s): ABDOMINAL AORTOGRAM W/LOWER EXTREMITY (N/A) as a surgical intervention.  The patient's history has been reviewed, patient examined, no change in status, stable for surgery.  I have reviewed the patient's chart and labs.  Questions were answered to the patient's satisfaction.     Durene Cal

## 2022-04-15 NOTE — Progress Notes (Signed)
ALYN, LOKEN ([id number]) Visit Report for 04/11/2022 Arrival Information Details Patient Name: Date of Service: SYD, SANCEN 04/11/2022 9:30 A M Medical Record Number: [id number] Patient Account Number: [id number] Date of Birth/Sex: Treating RN: 1956-03-30 (66 y.o. [name] Primary Care Eleshia Wooley: Dorthy Cooler, Dibas Other Clinician: Referring Stedman [place]: Treating Lexxus Underhill/Extender: Alanda Slim, Dibas Weeks in Treatment: 1 Visit Information History Since Last Visit Added or deleted any medications: No Patient Arrived: Knee Scooter Any new allergies or adverse reactions: No Arrival Time: 10:04 Had a fall or experienced change in No Accompanied By: wife activities of daily living that may affect Transfer Assistance: None risk of falls: Patient Identification Verified: Yes Signs or symptoms of abuse/neglect since last visito No Secondary Verification Process Completed: Yes Hospitalized since last visit: No Patient Requires Transmission-Based Precautions: No Implantable device outside of the clinic excluding No Patient Has Alerts: Yes cellular tissue based products placed in the center Patient Alerts: Patient on Blood Thinner since last visit: Has Dressing in Place as Prescribed: Yes Pain Present Now: Yes Electronic Signature(s) Signed: 04/11/2022 12:33:24 PM By: Erenest Blank Entered By: Erenest Blank on 04/11/2022 10:05:39 -------------------------------------------------------------------------------- Clinic Level of Care Assessment Details Patient Name: Date of Service: DUNG, COULBOURN 04/11/2022 9:30 A M Medical Record Number: [id number] Patient Account Number: [id number] Date of Birth/Sex: Treating RN: May 05, 1956 (66 y.o. [name] Primary Care Dollene Mallery: Dorthy Cooler, Dibas Other Clinician: Referring Leonides Minder: Treating Donnie Gedeon/Extender: Alanda Slim, Dibas Weeks in Treatment: 1 Clinic Level of Care Assessment Items TOOL 4  Quantity Score X- 1 0 Use when only an EandM is performed on FOLLOW-UP visit ASSESSMENTS - Nursing Assessment / Reassessment X- 1 10 Reassessment of Co-morbidities (includes updates in patient status) X- 1 5 Reassessment of Adherence to Treatment Plan ASSESSMENTS - Wound and Skin A ssessment / Reassessment X - Simple Wound Assessment / Reassessment - one wound 1 5 []  - 0 Complex Wound Assessment / Reassessment - multiple wounds []  - 0 Dermatologic / Skin Assessment (not related to wound area) ASSESSMENTS - Focused Assessment []  - 0 Circumferential Edema Measurements - multi extremities []  - 0 Nutritional Assessment / Counseling / Intervention []  - 0 Lower Extremity Assessment (monofilament, tuning fork, pulses) []  - 0 Peripheral Arterial Disease Assessment (using hand held doppler) ASSESSMENTS - Ostomy and/or Continence Assessment and Care []  - 0 Incontinence Assessment and Management []  - 0 Ostomy Care Assessment and Management (repouching, etc.) PROCESS - Coordination of Care []  - 0 Simple Patient / Family Education for ongoing care X- 1 20 Complex (extensive) Patient / Family Education for ongoing care []  - 0 Staff obtains Programmer, systems, Records, T Results / Process Orders est []  - 0 Staff telephones HHA, Nursing Homes / Clarify orders / etc []  - 0 Routine Transfer to another Facility (non-emergent condition) []  - 0 Routine Hospital Admission (non-emergent condition) []  - 0 New Admissions / Biomedical engineer / Ordering NPWT Apligraf, etc. , []  - 0 Emergency Hospital Admission (emergent condition) X- 1 10 Simple Discharge Coordination []  - 0 Complex (extensive) Discharge Coordination PROCESS - Special Needs []  - 0 Pediatric / Minor Patient Management []  - 0 Isolation Patient Management []  - 0 Hearing / Language / Visual special needs []  - 0 Assessment of Community assistance (transportation, D/C planning, etc.) []  - 0 Additional assistance / Altered  mentation []  - 0 Support Surface(s) Assessment (bed, cushion, seat, etc.) INTERVENTIONS - Wound Cleansing / Measurement X - Simple Wound Cleansing - one wound 1 5 []  - 0  Complex Wound Cleansing - multiple wounds X- 1 5 Wound Imaging (photographs - any number of wounds) []  - 0 Wound Tracing (instead of photographs) X- 1 5 Simple Wound Measurement - one wound []  - 0 Complex Wound Measurement - multiple wounds INTERVENTIONS - Wound Dressings X - Small Wound Dressing one or multiple wounds 1 10 []  - 0 Medium Wound Dressing one or multiple wounds []  - 0 Large Wound Dressing one or multiple wounds []  - 0 Application of Medications - topical []  - 0 Application of Medications - injection INTERVENTIONS - Miscellaneous []  - 0 External ear exam []  - 0 Specimen Collection (cultures, biopsies, blood, body fluids, etc.) []  - 0 Specimen(s) / Culture(s) sent or taken to Lab for analysis []  - 0 Patient Transfer (multiple staff / / Similar devices) []  - 0 Simple Staple / Suture removal (25 or less) []  - 0 Complex Staple / Suture removal (26 or more) []  - 0 Hypo / Hyperglycemic Management (close monitor of Blood Glucose) []  - 0 Ankle / Brachial Index (ABI) - do not check if billed separately X- 1 5 Vital Signs Has the patient been seen at the hospital within the last three years: Yes Total Score: 80 Level Of Care: New/Established - Level 3 Electronic Signature(s) Signed: 04/11/2022 12:23:09 PM By: Entered By: on 04/11/2022 10:51:13 -------------------------------------------------------------------------------- Encounter Discharge Information Details Patient Name: Date of Service: EL J. 04/11/2022 9:30 A M Medical Record Number: Patient Account Number: Date of Birth/Sex: Treating RN: Aug 19, 1955 (66 y.o. Primary Care Juniel Groene: , Dibas Other Clinician: Referring Jerre Vandrunen: Treating  Leyan Branden/Extender: , Dibas Weeks in Treatment: 1 Encounter Discharge Information Items Discharge Condition: Stable Ambulatory Status: Knee Scooter Discharge Destination: Home Transportation: Private Auto Accompanied By: wife Schedule Follow-up Appointment: Yes Clinical Summary of Care: Provided on 04/11/2022 Form Type Recipient Paper Patient Patient Electronic Signature(s) Signed: 04/11/2022 12:23:09 PM By: Antonieta Iba Entered By: Antonieta Iba on 04/11/2022 10:52:16 -------------------------------------------------------------------------------- Lower Extremity Assessment Details Patient Name: Date of Service: Georga Hacking. 04/11/2022 9:30 A M Medical Record Number: [id number] Patient Account Number: [id number] Date of Birth/Sex: Treating RN: 03-16-1956 (66 y.o. [name] Primary Care Annabell Oconnor: [name], Dibas Other Clinician: Referring Wilmar Prabhakar: Treating Kymani Laursen/Extender: Eda Keys, Dibas Weeks in Treatment: 1 Edema Assessment Assessed: [Left: No] [Right: No] Edema: [Left: Ye] [Right: s] Calf Left: Right: Point of Measurement: 31 cm From Medial Instep 41.5 cm Ankle Left: Right: Point of Measurement: 9 cm From Medial Instep 28.7 cm Electronic Signature(s) Signed: 04/11/2022 12:23:09 PM By: 06/11/2022 Signed: 04/11/2022 12:33:24 PM By: Antonieta Iba Entered By: 06/11/2022 on 04/11/2022 10:15:21 -------------------------------------------------------------------------------- Multi Wound Chart Details Patient Name: Date of Service: 06/11/2022 J. 04/11/2022 9:30 A M Medical Record Number: [id number] Patient Account Number: 05/27/1956 Date of Birth/Sex: Treating RN: 03-Apr-1956 (66 y.o. [name] Primary Care Sherlonda Flater: Eda Keys, Dibas Other Clinician: Referring Gracyn Santillanes: Treating Rydan Gulyas/Extender: 06/11/2022, Dibas Weeks in Treatment: 1 Vital Signs Height(in): 69 Pulse(bpm):  84 Weight(lbs): 215 Blood Pressure(mmHg): 175/77 Body Mass Index(BMI): 31.7 Temperature(F): 98.4 Respiratory Rate(breaths/min): 18 Photos: [N/A:N/A] Right, Lateral Foot N/A N/A Wound Location: Gradually Appeared N/A N/A Wounding Event: Arterial Insufficiency Ulcer N/A N/A Primary Etiology: Coronary Artery Disease, N/A N/A Comorbid History: Hypertension, Peripheral Arterial Disease, Neuropathy 01/22/2022 N/A N/A Date Acquired: 1 N/A N/A Weeks of Treatment: Open N/A N/A Wound Status: No N/A N/A Wound Recurrence: Yes N/A N/A Pending A mputation on  Presentation: 1.4x1.5x0.4 N/A N/A Measurements L x W x D (cm) 1.649 N/A N/A A (cm) : rea 0.66 N/A N/A Volume (cm) : 44.80% N/A N/A % Reduction in A rea: 55.80% N/A N/A % Reduction in Volume: Full Thickness With Exposed Support N/A N/A Classification: Structures Medium N/A N/A Exudate Amount: Serosanguineous N/A N/A Exudate Type: red, brown N/A N/A Exudate Color: Well defined, not attached N/A N/A Wound Margin: None Present (0%) N/A N/A Granulation Amount: Large (67-100%) N/A N/A Necrotic Amount: Fat Layer (Subcutaneous Tissue): Yes N/A N/A Exposed Structures: Tendon: Yes Fascia: No Muscle: No Joint: No Bone: No Small (1-33%) N/A N/A Epithelialization: Treatment Notes Wound #1 (Foot) Wound Laterality: Right, Lateral Cleanser Soap and Water Discharge Instruction: May shower and wash wound with dial antibacterial soap and water prior to dressing change. Peri-Wound Care Topical Primary Dressing Dakin's Solution 0.25%, 16 (oz) Discharge Instruction: Moisten 2x2 gauze with Dakin's solution Secondary Dressing Optifoam Non-Adhesive Dressing, 4x4 in Discharge Instruction: Apply as donut over primary dressing as directed. Woven Gauze Sponges 2x2 in Discharge Instruction: Apply over primary dressing as directed. Secured With [employer], 4.5x3.1 (in/yd) Discharge Instruction: Secure with Kerlix as  directed. 68M Medipore H Soft Cloth Surgical T ape, 4 x 10 (in/yd) Discharge Instruction: Secure with tape as directed. Compression Wrap Compression Stockings Add-Ons Electronic Signature(s) Signed: 04/11/2022 12:23:09 PM By: Antonieta Iba Signed: 04/15/2022 12:26:30 PM By: Geralyn Corwin DO Entered By: Geralyn Corwin on 04/11/2022 11:18:21 -------------------------------------------------------------------------------- Multi-Disciplinary Care Plan Details Patient Name: Date of Service: Georga Hacking EL J. 04/11/2022 9:30 A M Medical Record Number: [id number] Patient Account Number: [id number] Date of Birth/Sex: Treating RN: October 27, 1955 (66 y.o. [name] Primary Care Teyah Rossy: [name], Dibas Other Clinician: Referring Oakland Fant: Treating Neena Beecham/Extender: Eda Keys, Dibas Weeks in Treatment: 1 Active Inactive Electronic Signature(s) Signed: 04/11/2022 11:17:34 AM By: Antonieta Iba Entered By: Antonieta Iba on 04/11/2022 11:17:34 -------------------------------------------------------------------------------- Pain Assessment Details Patient Name: Date of Service: Sharmaine Base. 04/11/2022 9:30 A M Medical Record Number: [id number] Patient Account Number: [id number] Date of Birth/Sex: Treating RN: 03/03/1956 (66 y.o. [name] Primary Care Addilynn Mowrer: [name], Dibas Other Clinician: Referring Fulton Merry: Treating Tawnya Pujol/Extender: Eda Keys, Dibas Weeks in Treatment: 1 Active Problems Location of Pain Severity and Description of Pain Patient Has Paino Yes Site Locations Pain Location: Generalized Pain, Pain in Ulcers With Dressing Change: No Duration of the Pain. Constant / Intermittento Intermittent Rate the pain. Current Pain Level: 7 Pain Management and Medication Current Pain Management: Electronic Signature(s) Signed: 04/11/2022 12:23:09 PM By: Antonieta Iba Signed: 04/11/2022 12:33:24 PM By: Thayer Dallas Entered By: Thayer Dallas on 04/11/2022 10:06:31 -------------------------------------------------------------------------------- Patient/Caregiver Education Details Patient Name: Date of Service: Sharmaine Base 9/1/2023andnbsp9:30 A M Medical Record Number: [id number] Patient Account Number: [id number] Date of Birth/Gender: Treating RN: 04-29-1956 (66 y.o. [name] Primary Care Physician: [name], Dibas Other Clinician: Referring Physician: Treating Physician/Extender: Eda Keys, Dibas Weeks in Treatment: 1 Education Assessment Education Provided To: Patient Education Topics Provided Wound/Skin Impairment: Methods: Explain/Verbal, Printed Responses: State content correctly Electronic Signature(s) Signed: 04/11/2022 12:23:09 PM By: Antonieta Iba Entered By: Antonieta Iba on 04/11/2022 10:37:07 -------------------------------------------------------------------------------- Wound Assessment Details Patient Name: Date of Service: Sharmaine Base. 04/11/2022 9:30 A M Medical Record Number: [id number] Patient Account Number: [id number] Date of Birth/Sex: Treating RN: 1955/11/22 (65 y.o. [name] Primary Care Quinita Kostelecky: [name], Dibas Other Clinician: Referring Karlei Waldo: Treating Dash Cardarelli/Extender: Eda Keys, Dibas Weeks in Treatment: 1 Wound Status Wound Number: 1  Primary Arterial Insufficiency Ulcer Etiology: Wound Location: Right, Lateral Foot Wound Open Wounding Event: Gradually Appeared Status: Date Acquired: 01/22/2022 Comorbid Coronary Artery Disease, Hypertension, Peripheral Arterial Weeks Of Treatment: 1 History: Disease, Neuropathy Clustered Wound: No Pending Amputation On Presentation Photos Wound Measurements Length: (cm) 1.4 Width: (cm) 1.5 Depth: (cm) 0.4 Area: (cm) 1.649 Volume: (cm) 0.66 % Reduction in Area: 44.8% % Reduction in Volume: 55.8% Epithelialization: Small  (1-33%) Tunneling: No Undermining: No Wound Description Classification: Full Thickness With Exposed Support Structures Wound Margin: Well defined, not attached Exudate Amount: Medium Exudate Type: Serosanguineous Exudate Color: red, brown Foul Odor After Cleansing: No Slough/Fibrino Yes Wound Bed Granulation Amount: None Present (0%) Exposed Structure Necrotic Amount: Large (67-100%) Fascia Exposed: No Necrotic Quality: Adherent Slough Fat Layer (Subcutaneous Tissue) Exposed: Yes Tendon Exposed: Yes Muscle Exposed: No Joint Exposed: No Bone Exposed: No Electronic Signature(s) Signed: 04/11/2022 12:23:09 PM By: Lorrin Jackson Signed: 04/15/2022 12:26:30 PM By: Kalman Shan DO Entered By: Kalman Shan on 04/11/2022 10:33:03 -------------------------------------------------------------------------------- [place] Details Patient Name: Date of Service: Narda Rutherford EL J. 04/11/2022 9:30 A M Medical Record Number: [id number] Patient Account Number: [id number] Date of Birth/Sex: Treating RN: 1955-09-07 (66 y.o. [name] Primary Care Lynora Dymond: Dorthy Cooler, Dibas Other Clinician: Referring Essie Gehret: Treating Harlie Buening/Extender: Alanda Slim, Dibas Weeks in Treatment: 1 Vital Signs Time Taken: 10:05 Temperature (F): 98.4 Height (in): 69 Pulse (bpm): 84 Weight (lbs): 215 Respiratory Rate (breaths/min): 18 Body Mass Index (BMI): 31.7 Blood Pressure (mmHg): 175/77 Reference Range: 80 - 120 mg / dl Electronic Signature(s) Signed: 04/11/2022 12:33:24 PM By: Erenest Blank Entered By: Erenest Blank on 04/11/2022 10:06:03

## 2022-04-15 NOTE — Interval H&P Note (Signed)
History and Physical Interval Note:  04/15/2022 8:33 AM  Clinton [name]  has presented today for surgery, with the diagnosis of atherosclerosis of native arteries of the extremities with ulceration.  The various methods of treatment have been discussed with the patient and family. After consideration of risks, benefits and other options for treatment, the patient has consented to  Procedure(s): ABDOMINAL AORTOGRAM W/LOWER EXTREMITY (N/A) as a surgical intervention.  The patient's history has been reviewed, patient examined, no change in status, stable for surgery.  I have reviewed the patient's chart and labs.  Questions were answered to the patient's satisfaction.     Wells Saadiya Wilfong   

## 2022-04-15 NOTE — Progress Notes (Signed)
Clinton [name] ([id number]) Visit Report for 04/11/2022 Chief Complaint Document Details Patient Name: Date of Service: Clinton, [name] 04/11/2022 9:30 A M Medical Record Number: [id number] Patient Account Number: [id number] Date of Birth/Sex: Treating RN: [birth year] (66 y.o. Clinton [name] Primary Care Provider: Docia [name], Clinton Other Clinician: Referring Provider: Treating Provider/Extender: Clinton [name], Clinton [name] in Treatment: 1 Information Obtained from: Patient Chief Complaint 03/31/2022; right foot wound Electronic Signature(s) Signed: 04/15/2022 12:26:30 PM By: Clinton Corwin DO Entered By: Clinton [name] on 04/11/2022 11:18:26 -------------------------------------------------------------------------------- HPI Details Patient Name: Date of Service: Clinton Hacking EL J. 04/11/2022 9:30 A M Medical Record Number: [id number] Patient Account Number: [id number] Date of Birth/Sex: Treating RN: 04/14/1956 (66 y.o. Clinton [name] Primary Care Provider: Docia [name], Clinton Other Clinician: Referring Provider: Treating Provider/Extender: Clinton [name], Clinton [name] in Treatment: 1 History of Present Illness HPI Description: Admission 03/31/2022 Clinton [name] is a 66 year old male with a past medical history of hypertension, hyperlipidemia and peripheral arterial disease that presents to the clinic for a 30-month history of nonhealing ulcer to the right lateral foot. He thinks this started from his shoes rubbing against his foot. He does not have neuropathy and is prediabetic. He does have significant peripheral arterial disease that has required arteriogram with intervention. Last done on 02/04/2022. The right leg had Drug-coated balloon angioplasty to the right popliteal artery and balloon angioplasty to the right anterior tibial artery.[name] Kitchen His most recent ABIs with TBI's were noncompressible and 0.21 respectively. Patient currently reports pain,  increased erythema and warmth to the foot. He is currently on [place]. He has at least 1 more week supply. He states he had imaging done recently with an x-ray that did not show any acute osseous abnormality. I can not see the image or report. He has been using Santyl to the wound bed. He denies fever/chills, nausea/vomiting or pain to the lower extremity. 8/25; patient was scheduled for follow-up next week however due to results of the CT scan patient wanted to be seen sooner. He is still taking [place]. He been doing Dakin's wet-to-dry dressings. His symptoms are stable and he does not have increased pain, warmth or erythema to the wound. No purulent drainage. 9/1; patient presents for follow-up. He saw Dr. Renold [name] with infectious disease on 8/29. Dr. Renold [name] has held antibiotics. He was referred to orthopedic surgery for debridement and possible bone culture. Patient followed up with orthopedic surgery, Dr. Lajoyce [name] and plan is for fifth ray amputation. He also followed up with Dr. Myra [name] and plan is to have another angiography to try and optimize blood flow prior to the amputation. Patient has had some yellow drainage over the past couple days. He denies increased warmth or erythema to the foot. He has been using Dakin's wet-to-dry dressings. Electronic Signature(s) Signed: 04/15/2022 12:26:30 PM By: Clinton Corwin DO Entered By: Clinton [name] on 04/11/2022 11:21:48 -------------------------------------------------------------------------------- Physical Exam Details Patient Name: Date of Service: Clinton [name]. 04/11/2022 9:30 A M Medical Record Number: [id number] Patient Account Number: [id number] Date of Birth/Sex: Treating RN: 1956/01/13 (66 y.o. Clinton [name] Primary Care Provider: Docia [name], Clinton Other Clinician: Referring Provider: Treating Provider/Extender: Clinton [name], Clinton [name] in Treatment: 1 Constitutional respirations regular, non-labored and within target range  for patient.. Cardiovascular 2+ dorsalis pedis/posterior tibialis pulses. Psychiatric pleasant and cooperative. Notes Right lower extremity: T the right plantar/lateral foot there is an open wound with necrotic tissue throughout. No increased erythema or warmth o Electronic Signature(s) Signed: 04/15/2022 12:26:30 PM By:  Clinton Shan DO Entered By: Clinton [name] on 04/11/2022 11:23:28 -------------------------------------------------------------------------------- Physician Orders Details Patient Name: Date of Service: Clinton, [name] 04/11/2022 9:30 A M Medical Record Number: [id number] Patient Account Number: [id number] Date of Birth/Sex: Treating RN: 1955/08/20 (66 y.o. [name] Primary Care Provider: Dorthy [name], Clinton Other Clinician: Referring Provider: Treating Provider/Extender: Clinton [name], Clinton [name] in Treatment: 1 Verbal / Phone Orders: No Diagnosis Coding Follow-up Appointments ppointment in 2 [name]. - Please cancel appt for 04/17/22 @ 3:30pm Return A Other: - Amputation scheduled for 04/16/22: No further follow -up needed here at this time. Anesthetic (In clinic) Topical Lidocaine 5% applied to wound bed (In clinic) Topical Lidocaine 4% applied to wound bed Bathing/ Shower/ Hygiene May shower with protection but do not get wound dressing(s) wet. Off-Loading Crutches for Offloading Other: - Limit time on your feet. Additional Orders / Instructions Follow Nutritious Diet Consults Podiatry - Referral to [facility] to have orthodics made after surgery. Patient Medications llergies: Sulfa (Sulfonamide Antibiotics), penicillin A Notifications Medication Indication Start End 04/11/2022 doxycycline hyclate DOSE 1 - oral 100 mg tablet - 1 tablet oral BID x 7 days Electronic Signature(s) Signed: 04/15/2022 12:26:30 PM By: Clinton Shan DO Previous Signature: 04/11/2022 10:58:12 AM Version By: Clinton Shan DO Entered By:  Clinton [name] on 04/11/2022 11:23:40 Prescription 04/11/2022 -------------------------------------------------------------------------------- Clinton [name]. Clinton Shan DO Patient Name: Provider: 12-13-1955 [id number] Date of Birth: NPI#: Clinton [name] [id number] Sex: DEA #: [phone] Phone #: License #: [place] Patient Address: [address], [place] [address] [address], [place] [address] [phone] Allergies Sulfa (Sulfonamide Antibiotics); penicillin Provider's Orders Podiatry - Referral to [facility] to have orthodics made after surgery. Hand Signature: Date(s): Electronic Signature(s) Signed: 04/15/2022 12:26:30 PM By: Clinton Shan DO Entered By: Clinton [name] on 04/11/2022 11:23:40 -------------------------------------------------------------------------------- Problem List Details Patient Name: Date of Service: Clinton Nims J. 04/11/2022 9:30 A M Medical Record Number: [id number] Patient Account Number: [id number] Date of Birth/Sex: Treating RN: 1956-02-10 (66 y.o. [name] Primary Care Provider: Dorthy [name], Clinton Other Clinician: Referring Provider: Treating Provider/Extender: Clinton [name], Clinton [name] in Treatment: 1 Active Problems ICD-10 Encounter Code Description Active Date MDM Diagnosis L97.512 Non-pressure chronic ulcer of other part of right foot with fat layer exposed 03/31/2022 No Yes I70.235 Atherosclerosis of native arteries of right leg with ulceration of other part of 03/31/2022 No Yes foot I73.9 Peripheral vascular disease, unspecified 03/31/2022 No Yes R52 Pain, unspecified 03/31/2022 No Yes M86.671 Other chronic osteomyelitis, right ankle and foot 04/11/2022 No Yes Inactive Problems Resolved Problems Electronic Signature(s) Signed: 04/15/2022 12:26:30 PM By: Clinton Shan DO Entered By: Clinton [name] on 04/11/2022  11:18:16 -------------------------------------------------------------------------------- Progress Note Details Patient Name: Date of Service: Clinton Heinz. 04/11/2022 9:30 A M Medical Record Number: [id number] Patient Account Number: [id number] Date of Birth/Sex: Treating RN: 27-Jul-1956 (66 y.o. [name] Primary Care Provider: Dorthy [name], Clinton Other Clinician: Referring Provider: Treating Provider/Extender: Clinton [name], Clinton [name] in Treatment: 1 Subjective Chief Complaint Information obtained from Patient 03/31/2022; right foot wound History of Present Illness (HPI) Admission 03/31/2022 Mr. Welford Guzek is a 66 year old male with a past medical history of hypertension, hyperlipidemia and peripheral arterial disease that presents to the clinic for a 75-month history of nonhealing ulcer to the right lateral foot. He thinks this started from his shoes rubbing against his foot. He does not have neuropathy and is prediabetic.  He does have significant peripheral arterial disease that has required arteriogram with intervention. Last done on 02/04/2022. The right leg had Drug-coated balloon angioplasty to the right popliteal artery and balloon angioplasty to the right anterior tibial artery.[name] Kitchen His most recent ABIs with TBI's were noncompressible and 0.21 respectively. Patient currently reports pain, increased erythema and warmth to the foot. He is currently on [place]. He has at least 1 more week supply. He states he had imaging done recently with an x-ray that did not show any acute osseous abnormality. I can not see the image or report. He has been using Santyl to the wound bed. He denies fever/chills, nausea/vomiting or pain to the lower extremity. 8/25; patient was scheduled for follow-up next week however due to results of the CT scan patient wanted to be seen sooner. He is still taking [place]. He been doing Dakin's wet-to-dry dressings. His symptoms are stable and he  does not have increased pain, warmth or erythema to the wound. No purulent drainage. 9/1; patient presents for follow-up. He saw Dr. Gale Journey with infectious disease on 8/29. Dr. Gale Journey has held antibiotics. He was referred to orthopedic surgery for debridement and possible bone culture. Patient followed up with orthopedic surgery, Dr. Sharol Given and plan is for fifth ray amputation. He also followed up with Dr. Trula Slade and plan is to have another angiography to try and optimize blood flow prior to the amputation. Patient has had some yellow drainage over the past couple days. He denies increased warmth or erythema to the foot. He has been using Dakin's wet-to-dry dressings. Patient History Information obtained from Patient, Chart. Family History Diabetes - Father, Heart Disease - Father, Hypertension - Mother,Father,Siblings, Stroke - Father, Thyroid Problems - Mother, No family history of Cancer, Hereditary Spherocytosis, Kidney Disease, Lung Disease, Seizures, Tuberculosis. Social History Never smoker, Marital Status - Married, Alcohol Use - Moderate, Drug Use - No History, Caffeine Use - Daily. Medical History Eyes Denies history of Cataracts, Glaucoma, Optic Neuritis Hematologic/Lymphatic Denies history of Anemia, Hemophilia, Human Immunodeficiency Virus, Lymphedema, Sickle Cell Disease Respiratory Denies history of Aspiration, Asthma, Chronic Obstructive Pulmonary Disease (COPD), Pneumothorax, Sleep Apnea, Tuberculosis Cardiovascular Patient has history of Coronary Artery Disease, Hypertension, Peripheral Arterial Disease Denies history of Angina, Arrhythmia, Congestive Heart Failure, Deep Vein Thrombosis, Hypotension, Myocardial Infarction, Peripheral Venous Disease, Phlebitis Gastrointestinal Denies history of Cirrhosis , Colitis, Crohnoos, Hepatitis A, Hepatitis B, Hepatitis C Endocrine Denies history of Type I Diabetes, Type II Diabetes Genitourinary Denies history of End Stage Renal  Disease Immunological Denies history of Lupus Erythematosus, Raynaudoos, Scleroderma Integumentary (Skin) Denies history of History of Burn Musculoskeletal Denies history of Gout, Rheumatoid Arthritis, Osteoarthritis, Osteomyelitis Neurologic Patient has history of Neuropathy Denies history of Dementia, Quadriplegia, Paraplegia, Seizure Disorder Oncologic Denies history of Received Chemotherapy, Received Radiation Psychiatric Denies history of Anorexia/bulimia, Confinement Anxiety Objective Constitutional respirations regular, non-labored and within target range for patient.. Vitals Time Taken: 10:05 AM, Height: 69 in, Weight: 215 lbs, BMI: 31.7, Temperature: 98.4 F, Pulse: 84 bpm, Respiratory Rate: 18 breaths/min, Blood Pressure: 175/77 mmHg. Cardiovascular 2+ dorsalis pedis/posterior tibialis pulses. Psychiatric pleasant and cooperative. General Notes: Right lower extremity: T the right plantar/lateral foot there is an open wound with necrotic tissue throughout. No increased erythema or warmth o Integumentary (Hair, Skin) Wound #1 status is Open. Original cause of wound was Gradually Appeared. The date acquired was: 01/22/2022. The wound has been in treatment 1 [name]. The wound is located on the Right,Lateral Foot. The wound measures 1.4cm length x 1.5cm  width x 0.4cm depth; 1.649cm^2 area and 0.66cm^3 volume. There is tendon and Fat Layer (Subcutaneous Tissue) exposed. There is no tunneling or undermining noted. There is a medium amount of serosanguineous drainage noted. The wound margin is well defined and not attached to the wound [name]. There is no granulation within the wound bed. There is a large (67-100%) amount of necrotic tissue within the wound bed including Adherent Slough. 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 He had an MRI that showed osteomyelitis. He was referred to infectious disease and patient saw Dr. Bartholomew Crews. Antibiotics were held with the hope of doing a bone biopsy/culture. Patient has decided on fifth ray amputation after seeing Dr. Lajoyce [name]. He will also see Dr. Myra [name] for angiography to try and optimize blood flow prior to amputation. For now recommended Dakin's wet-to-dry dressings. Since he is having some yellow drainage over the past few days I recommended restarting antibiotics and I will place him on doxycycline. He is allergic to sulfa and penicillin. Post surgery he will need orthotics. We will refer him to podiatry. He may follow-up with [place] as needed. Plan Follow-up Appointments: Return Appointment in 2 [name]. - Please cancel appt for 04/17/22 @ 3:30pm Other: - Amputation scheduled for 04/16/22: No further follow -up needed here at this time. Anesthetic: (In clinic) Topical Lidocaine 5% applied to wound bed (In clinic) Topical Lidocaine 4% applied to wound bed Bathing/ Shower/ Hygiene: May shower with protection but do not get wound dressing(s) wet. Off-Loading: Crutches for Offloading Other: - Limit time on your feet. Additional Orders / Instructions: Follow Nutritious Diet Consults ordered were: Podiatry - Referral to [facility] to have orthodics made after surgery. The following medication(s) was prescribed: doxycycline hyclate oral 100 mg tablet 1 1 tablet oral BID x 7 days starting 04/11/2022 1. Dakin's wet-to-dry dressings 2. Doxycyline 3. Referral to podiatry 4. Follow up as needed Electronic Signature(s) Signed: 04/15/2022 12:26:30 PM By: Clinton Corwin DO Entered By: Clinton [name] on 04/11/2022 11:28:11 -------------------------------------------------------------------------------- HxROS Details Patient Name: Date of  Service: Clinton Hacking EL J. 04/11/2022 9:30 A M Medical Record Number: [id number] Patient Account Number: [id number] Date of Birth/Sex: Treating RN: 1956-06-02 (65 y.o. Clinton [name] Primary Care Provider: Docia [name], Clinton Other Clinician: Referring Provider: Treating Provider/Extender: Clinton [name], Clinton [name] in Treatment: 1 Information Obtained From Patient Chart Eyes Medical History: Negative for: Cataracts; Glaucoma; Optic Neuritis Hematologic/Lymphatic Medical History: Negative for: Anemia; Hemophilia; Human Immunodeficiency Virus; Lymphedema; Sickle Cell Disease Respiratory Medical History: Negative for: Aspiration; Asthma; Chronic Obstructive Pulmonary Disease (COPD); Pneumothorax; Sleep Apnea; Tuberculosis Cardiovascular Medical History: Positive for: Coronary Artery Disease; Hypertension; Peripheral Arterial Disease Negative for: Angina; Arrhythmia; Congestive Heart Failure; Deep Vein Thrombosis; Hypotension; Myocardial Infarction; Peripheral Venous Disease; Phlebitis Gastrointestinal Medical History: Negative for: Cirrhosis ; Colitis; Crohns; Hepatitis A; Hepatitis B; Hepatitis C Endocrine Medical History: Negative for: Type I Diabetes; Type II Diabetes Genitourinary Medical History: Negative for: End Stage Renal Disease Immunological Medical History: Negative for: Lupus Erythematosus; Raynauds; Scleroderma Integumentary (Skin) Medical History: Negative for: History of Burn Musculoskeletal Medical History: Negative for: Gout; Rheumatoid Arthritis; Osteoarthritis; Osteomyelitis Neurologic Medical History: Positive for: Neuropathy Negative for: Dementia; Quadriplegia; Paraplegia; Seizure Disorder Oncologic Medical History: Negative for: Received Chemotherapy; Received Radiation Psychiatric Medical History:  Negative for: Anorexia/bulimia; Confinement Anxiety Immunizations Pneumococcal Vaccine: Received Pneumococcal Vaccination:  Yes Received Pneumococcal Vaccination On or After 60th Birthday: Yes Implantable Devices None Family and Social History Cancer: No; Diabetes: Yes - Father; Heart Disease: Yes - Father; Hereditary Spherocytosis: No; Hypertension: Yes - Mother,Father,Siblings; Kidney Disease: No; Lung Disease: No; Seizures: No; Stroke: Yes - Father; Thyroid Problems: Yes - Mother; Tuberculosis: No; Never smoker; Marital Status - Married; Alcohol Use: Moderate; Drug Use: No History; Caffeine Use: Daily; Financial Concerns: No; Food, Clothing or Shelter Needs: No; Support System Lacking: No; Transportation Concerns: No Electronic Signature(s) Signed: 04/11/2022 12:23:09 PM By: Lorrin Jackson Signed: 04/15/2022 12:26:30 PM By: Clinton Shan DO Entered By: Clinton [name] on 04/11/2022 11:21:52 -------------------------------------------------------------------------------- SuperBill Details Patient Name: Date of Service: Clinton Heinz. 04/11/2022 Medical Record Number: [id number] Patient Account Number: [id number] Date of Birth/Sex: Treating RN: 08-07-1956 (66 y.o. [name] Primary Care Provider: Dorthy [name], Clinton Other Clinician: Referring Provider: Treating Provider/Extender: Clinton [name], Clinton [name] in Treatment: 1 Diagnosis Coding ICD-10 Codes Code Description [phone] Non-pressure chronic ulcer of other part of right foot with fat layer exposed I70.235 Atherosclerosis of native arteries of right leg with ulceration of other part of foot I73.9 Peripheral vascular disease, unspecified R52 Pain, unspecified Facility Procedures CPT4 Code: [id number] Description: 99213 - WOUND CARE VISIT-LEV 3 EST PT Modifier: Quantity: 1 Physician Procedures : CPT4 Code Description Modifier [id number] - WC PHYS LEVEL 3 - EST PT ICD-10 Diagnosis Description L97.512 Non-pressure chronic ulcer of other part of right foot with fat layer exposed I70.235 Atherosclerosis of native arteries of right  leg with  ulceration of other part of foot I73.9 Peripheral vascular disease, unspecified R52 Pain, unspecified Quantity: 1 Electronic Signature(s) Signed: 04/15/2022 12:26:30 PM By: Clinton Shan DO Entered By: Clinton [name] on 04/11/2022 11:28:25

## 2022-04-15 NOTE — Op Note (Signed)
Patient name: Clinton [name] MRN: [id number] DOB: 05-Feb-1956 Sex: male  04/15/2022 Pre-operative Diagnosis: right toe ulcer Post-operative diagnosis:  Same Surgeon:  [name] Procedure Performed:  1.  Ultrasound-guided access, left femoral artery  2.  Abdominal aortogram  3.  Bilateral lower extremity runoff  4.  Angioplasty, right popliteal artery  5.  Angioplasty, right anterior tibial artery  6.  Conscious sedation, 78 minutes  7.  Closure device, Celt  WiFi: 1, 0, 1   Indications: This is a 66 year old gentleman who has previously undergone bilateral percutaneous revascularization.  He is now scheduled for transportation for a nonhealing wound.  Angiography was recommended to optimize blood flow to prevent more proximal amputation.  Procedure:  The patient was identified in the holding area and taken to room 8.  The patient was then placed supine on the table and prepped and draped in the usual sterile fashion.  A time out was called.  Conscious sedation was administered with the use of IV fentanyl and Versed under continuous physician and nurse monitoring.  Heart rate, blood pressure, and oxygen saturation were continuously monitored.  Total sedation time was 78 minutes.  Ultrasound was used to evaluate the left common femoral artery.  It was patent .  A digital ultrasound image was acquired.  A micropuncture needle was used to access the left common femoral artery under ultrasound guidance.  An 018 wire was advanced without resistance and a micropuncture sheath was placed.  The 018 wire was removed and a benson wire was placed.  The micropuncture sheath was exchanged for a 5 french sheath.  An omniflush catheter was advanced over the wire to the level of L-1.  An abdominal angiogram was obtained.  Next, using the omniflush catheter and a benson wire, the aortic bifurcation was crossed and the catheter was placed into theright external iliac artery and right runoff was obtained.   left runoff was performed via retrograde sheath injections.  Findings:   Aortogram: No significant renal artery stenosis was identified.  The infrarenal abdominal aorta is widely patent.  Bilateral common and external iliac arteries are widely patent.  Right Lower Extremity: The right common femoral, profundofemoral, and superficial femoral artery are widely patent.  The popliteal artery is patent throughout its course it is heavily calcified with mild luminal irregularity, stenosis approximately 50%.  There is a high-grade lesion at the origin of the anterior tibial artery, greater than 80%.  The tibial vessels become diffusely diseased as they go down to the foot with diffuse disease out onto the foot.  Left Lower Extremity: The left common femoral, profundofemoral, and superficial femoral artery are widely patent.  The popliteal artery is calcified but patent throughout its course.  The dominant runoff vessels the anterior tibial and peroneal artery  Intervention: After the above images were acquired the decision made to proceed with intervention.  A 5 French sheath was advanced into the right external iliac artery.  The patient is fully heparinized.  Next using a V-18 wire and a 2.5 x 100 Sterling balloon, wire access was obtained into the anterior tibial artery down to the ankle.  I performed balloon angioplasty using multiple inflations at 40 atm from the ankle up through the anterior tibial artery into the popliteal artery at the level of the joint space.  Completion imaging showed significant improvement in the stenosis.  I elected to upsized to a 3 mm balloon to treat the popliteal artery and proximal anterior tibial artery.  Completion  imaging was then performed which showed no residual high-grade lesions.  There remains diffuse disease out onto the foot.  The patient has been optimized from a vascular perspective.  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  Successfully treated using a 3 mm balloon.  #2  The patient has been optimized from a vascular perspective to proceed with palpitation tomorrow  V. [name], M.D., [facility] [place] Office: [phone] Pager:  [phone]

## 2022-04-15 NOTE — Progress Notes (Signed)
Pt arrived to 4e from cath lab. Pt oriented to room and staff. Vitals obtained. Left groin site assessed; level 0. Doppler signals present left DP and right PT. Right toe wound dressing clean, dry, and intact. Wife at bedside.

## 2022-04-16 ENCOUNTER — Ambulatory Visit (HOSPITAL_COMMUNITY): Admission: RE | Admit: 2022-04-16 | Payer: 59 | Source: Home / Self Care | Admitting: Orthopedic Surgery

## 2022-04-16 ENCOUNTER — Other Ambulatory Visit: Payer: Self-pay

## 2022-04-16 ENCOUNTER — Encounter (HOSPITAL_COMMUNITY)
Admission: RE | Disposition: A | Discharge status: Home Health | Payer: Self-pay | Source: Home / Self Care | Attending: Surgery

## 2022-04-16 ENCOUNTER — Observation Stay (HOSPITAL_COMMUNITY): Payer: 59 | Admitting: Anesthesiology

## 2022-04-16 ENCOUNTER — Observation Stay (HOSPITAL_BASED_OUTPATIENT_CLINIC_OR_DEPARTMENT_OTHER): Payer: 59 | Admitting: Anesthesiology

## 2022-04-16 ENCOUNTER — Encounter (HOSPITAL_BASED_OUTPATIENT_CLINIC_OR_DEPARTMENT_OTHER): Payer: 59 | Admitting: General Surgery

## 2022-04-16 ENCOUNTER — Encounter (HOSPITAL_COMMUNITY): Payer: Self-pay | Admitting: Surgery

## 2022-04-16 DIAGNOSIS — I10 Essential (primary) hypertension: Secondary | ICD-10-CM | POA: Diagnosis not present

## 2022-04-16 DIAGNOSIS — M86171 Other acute osteomyelitis, right ankle and foot: Secondary | ICD-10-CM | POA: Diagnosis not present

## 2022-04-16 DIAGNOSIS — I739 Peripheral vascular disease, unspecified: Secondary | ICD-10-CM

## 2022-04-16 DIAGNOSIS — M869 Osteomyelitis, unspecified: Secondary | ICD-10-CM | POA: Diagnosis not present

## 2022-04-16 DIAGNOSIS — Z87891 Personal history of nicotine dependence: Secondary | ICD-10-CM | POA: Diagnosis not present

## 2022-04-16 DIAGNOSIS — I7025 Atherosclerosis of native arteries of other extremities with ulceration: Secondary | ICD-10-CM

## 2022-04-16 HISTORY — PX: AMPUTATION: SHX166

## 2022-04-16 LAB — LIPID PANEL
Cholesterol: 107 mg/dL (ref 0–200)
HDL: 37 mg/dL — ABNORMAL LOW (ref >40)
LDL Cholesterol: 58 mg/dL (ref 0–99)
Total CHOL/HDL Ratio: 2.9 RATIO
Triglycerides: 59 mg/dL (ref <150)
VLDL: 12 mg/dL (ref 0–40)

## 2022-04-16 LAB — SURGICAL PCR SCREEN
MRSA, PCR: NEGATIVE
Staphylococcus aureus: NEGATIVE

## 2022-04-16 LAB — GLUCOSE, CAPILLARY
Glucose-Capillary: 82 mg/dL (ref 70–99)
Glucose-Capillary: 116 mg/dL — ABNORMAL HIGH (ref 70–99)

## 2022-04-16 SURGERY — AMPUTATION, FOOT, RAY
Anesthesia: Monitor Anesthesia Care | Laterality: Right

## 2022-04-16 MED ORDER — CHLORHEXIDINE GLUCONATE 0.12 % MT SOLN: 15.0000 mL | Freq: Once | OROMUCOSAL | Status: AC

## 2022-04-16 MED ORDER — PROPOFOL 500 MG/50ML IV EMUL
INTRAVENOUS | Status: DC | PRN
  Administered 2022-04-16: 75 ug/kg/min via INTRAVENOUS

## 2022-04-16 MED ORDER — FENTANYL CITRATE (PF) 100 MCG/2ML IJ SOLN
INTRAMUSCULAR | Status: DC | PRN
  Administered 2022-04-16: 25 ug via INTRAVENOUS
  Administered 2022-04-16: 50 ug via INTRAVENOUS

## 2022-04-16 MED ORDER — EPHEDRINE SULFATE (PRESSORS) 50 MG/ML IJ SOLN
INTRAMUSCULAR | Status: DC | PRN
  Administered 2022-04-16: 7.5 mg via INTRAVENOUS

## 2022-04-16 MED ORDER — BUPIVACAINE HCL (PF) 0.25 % IJ SOLN
INTRAMUSCULAR | Status: AC
  Filled 2022-04-16: qty 30

## 2022-04-16 MED ORDER — LIDOCAINE 2% (20 MG/ML) 5 ML SYRINGE
INTRAMUSCULAR | Status: DC | PRN
  Administered 2022-04-16: 60 mg via INTRAVENOUS

## 2022-04-16 MED ORDER — BUPIVACAINE HCL (PF) 0.25 % IJ SOLN
INTRAMUSCULAR | Status: DC | PRN
  Administered 2022-04-16: 20 mL

## 2022-04-16 MED ORDER — ORAL CARE MOUTH RINSE: 15.0000 mL | Freq: Once | OROMUCOSAL | Status: AC

## 2022-04-16 MED ORDER — PROPOFOL 10 MG/ML IV BOLUS: INTRAVENOUS | Status: DC | PRN

## 2022-04-16 MED ORDER — CHLORHEXIDINE GLUCONATE 0.12 % MT SOLN
OROMUCOSAL | Status: AC
  Administered 2022-04-16: 15 mL via OROMUCOSAL
  Filled 2022-04-16: qty 15

## 2022-04-16 MED ORDER — MORPHINE SULFATE (PF) 2 MG/ML IV SOLN
4.0000 mg | INTRAVENOUS | Status: DC | PRN
  Administered 2022-04-16 – 2022-04-17 (×5): 4 mg via INTRAVENOUS
  Filled 2022-04-16 (×6): qty 2

## 2022-04-16 MED ORDER — LACTATED RINGERS IV SOLN: INTRAVENOUS | Status: DC

## 2022-04-16 MED ORDER — 0.9 % SODIUM CHLORIDE (POUR BTL) OPTIME
TOPICAL | Status: DC | PRN
  Administered 2022-04-16: 1000 mL

## 2022-04-16 MED ORDER — FENTANYL CITRATE (PF) 250 MCG/5ML IJ SOLN
INTRAMUSCULAR | Status: AC
  Filled 2022-04-16: qty 5

## 2022-04-16 SURGICAL SUPPLY — 34 items
BAG COUNTER SPONGE SURGICOUNT (BAG) ×2 IMPLANT
BAG SPNG CNTER NS LX DISP (BAG) ×1
BLADE AVERAGE 25X9 (BLADE) IMPLANT
BLADE SAW SGTL MED 73X18.5 STR (BLADE) IMPLANT
BLADE SURG 21 STRL SS (BLADE) ×2 IMPLANT
BNDG CMPR 5X6 CHSV STRCH STRL (GAUZE/BANDAGES/DRESSINGS) ×1
BNDG COHESIVE 4X5 TAN STRL (GAUZE/BANDAGES/DRESSINGS) ×2 IMPLANT
BNDG COHESIVE 6X5 TAN ST LF (GAUZE/BANDAGES/DRESSINGS) IMPLANT
COVER SURGICAL LIGHT HANDLE (MISCELLANEOUS) ×4 IMPLANT
DRAPE DERMATAC (DRAPES) IMPLANT
DRAPE U-SHAPE 47X51 STRL (DRAPES) ×4 IMPLANT
DRESSING PEEL AND PLC PRVNA 13 (GAUZE/BANDAGES/DRESSINGS) IMPLANT
DRSG ADAPTIC 3X8 NADH LF (GAUZE/BANDAGES/DRESSINGS) ×2 IMPLANT
DRSG PEEL AND PLACE PREVENA 13 (GAUZE/BANDAGES/DRESSINGS) ×1
DURAPREP 26ML APPLICATOR (WOUND CARE) ×2 IMPLANT
ELECT REM PT RETURN 9FT ADLT (ELECTROSURGICAL) ×1
ELECTRODE REM PT RTRN 9FT ADLT (ELECTROSURGICAL) ×2 IMPLANT
GAUZE SPONGE 4X4 12PLY STRL (GAUZE/BANDAGES/DRESSINGS) ×2 IMPLANT
GLOVE BIOGEL PI IND STRL 9 (GLOVE) ×2 IMPLANT
GLOVE SURG ORTHO 9.0 STRL STRW (GLOVE) ×2 IMPLANT
GOWN STRL REUS W/ TWL XL LVL3 (GOWN DISPOSABLE) ×4 IMPLANT
GOWN STRL REUS W/TWL XL LVL3 (GOWN DISPOSABLE) ×2
GRAFT SKIN WND MICRO 38 (Tissue) IMPLANT
KIT BASIN OR (CUSTOM PROCEDURE TRAY) ×2 IMPLANT
KIT DRSG PREVENA PLUS 7DAY 125 (MISCELLANEOUS) IMPLANT
KIT TURNOVER KIT B (KITS) ×2 IMPLANT
NS IRRIG 1000ML POUR BTL (IV SOLUTION) ×2 IMPLANT
PACK ORTHO EXTREMITY (CUSTOM PROCEDURE TRAY) ×2 IMPLANT
PAD ARMBOARD 7.5X6 YLW CONV (MISCELLANEOUS) ×4 IMPLANT
STOCKINETTE IMPERVIOUS LG (DRAPES) IMPLANT
SUT ETHILON 2 0 PSLX (SUTURE) ×2 IMPLANT
TOWEL GREEN STERILE (TOWEL DISPOSABLE) ×2 IMPLANT
TUBE CONNECTING 12X1/4 (SUCTIONS) ×2 IMPLANT
YANKAUER SUCT BULB TIP NO VENT (SUCTIONS) ×2 IMPLANT

## 2022-04-16 NOTE — Progress Notes (Signed)
PHARMACIST LIPID MONITORING  Clinton [name] is a 66 y.o. 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  Pharmacy has been consulted to optimize lipid-lowering therapy with the indication of secondary prevention for clinical ASCVD.  Recent Labs:  Lipid Panel (last 6 months):   Lab Results  Component Value Date   CHOL 107 04/16/2022   TRIG 59 04/16/2022   HDL 37 (L) 04/16/2022   CHOLHDL 2.9 04/16/2022   VLDL 12 04/16/2022   LDLCALC 58 04/16/2022   Hepatic function panel (last 6 months):   Lab Results  Component Value Date   AST 16 04/08/2022   ALT 24 04/08/2022   BILITOT 0.5 04/08/2022   SCr (since admission):   Serum creatinine: 1.2 mg/dL 96/28/36 6294 Estimated creatinine clearance: 70.9 mL/min  Current therapy and lipid therapy tolerance Current lipid-lowering therapy: atorvastatin 40 mg daily Previous lipid-lowering therapies (if applicable): none Documented or reported allergies or intolerances to lipid-lowering therapies (if applicable): none per chart review  Assessment:   Patient is on high intensity statin with atorvastatin 40 mg daily. LDL not at goal <55 mg/dL. Recommend increasing atorvastatin to 80 mg. However, Patient prefers no changes in lipid-lowering therapy at this time due to only liking to make medication changes after discussion with his PCP.   Plan:   Strongly encouraged patient to discuss increasing atorvastatin to 80 mg at his next PCP visit.   Pervis Hocking, PharmD Clinical Pharmacist 04/16/2022 8:17 AM

## 2022-04-16 NOTE — Consult Note (Signed)
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  Patient has been followed by the wound center and podiatry as well as infectious disease.  Patient was seen in consultation from infectious disease in the office.  Patient is currently on oral antibiotics.  Patient has undergone endovascular revascularization with Dr. Trula Slade yesterday with intervention for the popliteal and anterior tibial artery.  Past Medical History:  Diagnosis Date   Carotid artery occlusion    Cellulitis    Erectile dysfunction    Hyperlipidemia    Hypertension    Past Surgical History:  Procedure Laterality Date   ABDOMINAL AORTOGRAM W/LOWER EXTREMITY N/A 02/04/2022   Procedure: ABDOMINAL AORTOGRAM W/LOWER EXTREMITY;  Surgeon: Serafina Mitchell, MD;  Location: [facility];  Service: Cardiovascular;  Laterality: N/A;   ABDOMINAL AORTOGRAM W/LOWER EXTREMITY N/A 02/18/2022   Procedure: ABDOMINAL AORTOGRAM W/LOWER EXTREMITY;  Surgeon: Serafina Mitchell, MD;  Location: [facility];  Service: Cardiovascular;  Laterality: N/A;   COLONOSCOPY     ENDARTERECTOMY Right 09/05/2015   Procedure: ENDARTERECTOMY CAROTID RIGHT;  Surgeon: Elam Dutch, MD;  Location: [facility];  Service: Vascular;  Laterality: Right;   PATCH ANGIOPLASTY  09/05/2015   Procedure: PATCH ANGIOPLASTY RIGHT CAROTID ARTERY USING HEMASHIELD PLATINUM FINESSE PATCH;  Surgeon: Elam Dutch, MD;  Location: [facility];  Service: Vascular;;   PERIPHERAL VASCULAR ATHERECTOMY Left 02/18/2022   Procedure: PERIPHERAL VASCULAR ATHERECTOMY;  Surgeon: Serafina Mitchell, MD;  Location: [facility];  Service: Cardiovascular;  Laterality: Left;  Popliteal and Peroneal   PERIPHERAL VASCULAR BALLOON ANGIOPLASTY  02/04/2022   Procedure: PERIPHERAL VASCULAR BALLOON  ANGIOPLASTY;  Surgeon: Serafina Mitchell, MD;  Location: [facility];  Service: Cardiovascular;;   TONSILLECTOMY     VASECTOMY     Social History   Socioeconomic History   Marital status: Married    Spouse name: Not on file   Number of children: Not on file   Years of education: Not on file   Highest education level: Not on file  Occupational History   Not on file  Tobacco Use   Smoking status: Never   Smokeless tobacco: Former    Types: Chew    Quit date: 01/01/2015  Vaping Use   Vaping Use: Never used  Substance and Sexual Activity   Alcohol use: Yes    Alcohol/week: 7.0 standard drinks of alcohol    Types: 7 Cans of beer per week    Comment: pt states that he use to have a beer occasionly but has not had one in about one month   Drug use: No   Sexual activity: Not on file  Other Topics Concern   Not on file  Social History Narrative   Not on file   Social Determinants of Health   Financial Resource Strain: Not on file  Food Insecurity: Not on file  Transportation Needs: Not on file  Physical Activity: Not on file  Stress: Not on file  Social Connections: Not on file   Family History  Problem Relation Age of Onset   Other Mother        circulatory problems   Heart attack Mother    Deep vein thrombosis Mother    Heart disease Mother    Hyperlipidemia Father  Hypertension Father    Diabetes Father    Deep vein thrombosis Father    Heart disease Father        before age 33   Heart attack Father    Peripheral vascular disease Father    Hypertension Brother    Hyperlipidemia Brother    - negative except otherwise stated in the family history section Allergies  Allergen Reactions   Penicillins Rash    Has patient had a PCN reaction causing immediate rash, facial/tongue/throat swelling, SOB or lightheadedness with hypotension: Yes Has patient had a PCN reaction causing severe rash involving mucus membranes or skin necrosis: No Has patient had a PCN  reaction that required hospitalization No Has patient had a PCN reaction occurring within the last 10 years: No If all of the above answers are "NO", then may proceed with Cephalosporin use.    Sulfa Antibiotics Swelling and Other (See Comments)    Facial/lip swelling   Prior to Admission medications   Medication Sig Start Date End Date Taking? Authorizing Provider  acetaminophen (TYLENOL) 500 MG tablet Take 1,000 mg by mouth every 6 (six) hours as needed for moderate pain or mild pain.   Yes [provider]  amLODipine (NORVASC) 2.5 MG tablet Take 2.5 mg by mouth at bedtime. 05/06/21  Yes [provider]  aspirin EC 81 MG tablet Take 81 mg by mouth at bedtime.   Yes [provider]  atorvastatin (LIPITOR) 40 MG tablet Take 40 mg by mouth at bedtime. 02/25/17  Yes [provider]  clopidogrel (PLAVIX) 75 MG tablet Take 1 tablet (75 mg total) by mouth daily. Patient taking differently: Take 75 mg by mouth at bedtime. 02/04/22  Yes Serafina Mitchell, MD  irbesartan-hydrochlorothiazide (AVALIDE) 300-12.5 MG tablet Take 1 tablet by mouth at bedtime. 05/10/21  Yes [provider]  Multiple Vitamin (MULTIVITAMIN) capsule Take 2 capsules by mouth at bedtime. Gummy   Yes [provider]  sildenafil (VIAGRA) 100 MG tablet Take 100 mg by mouth as needed for erectile dysfunction. 08/03/19   [provider]   PERIPHERAL VASCULAR CATHETERIZATION  Result Date: 04/15/2022 Images from the original result were not included. Patient name: Clinton [name] MRN: [id number] DOB: Oct 02, 1955 Sex: male 04/15/2022 Pre-operative Diagnosis: right toe ulcer Post-operative diagnosis:  Same Surgeon:  [name] Procedure Performed:  1.  Ultrasound-guided access, left femoral artery  2.  Abdominal aortogram  3.  Bilateral lower extremity runoff  4.  Angioplasty, right popliteal artery  5.  Angioplasty, right anterior tibial artery  6.  Conscious sedation, 78 minutes   7.  Closure device, Celt WiFi: 1, 0, 1 Indications: This is a 66 year old gentleman who has previously undergone bilateral percutaneous revascularization.  He is now scheduled for transportation for a nonhealing wound.  Angiography was recommended to optimize blood flow to prevent more proximal amputation. Procedure:  The patient was identified in the holding area and taken to room 8.  The patient was then placed supine on the table and prepped and draped in the usual sterile fashion.  A time out was called.  Conscious sedation was administered with the use of IV fentanyl and Versed under continuous physician and nurse monitoring.  Heart rate, blood pressure, and oxygen saturation were continuously monitored.  Total sedation time was 78 minutes.  Ultrasound was used to evaluate the left common femoral artery.  It was patent .  A digital ultrasound image was acquired.  A micropuncture needle was used to access the  left common femoral artery under ultrasound guidance.  An 018 wire was advanced without resistance and a micropuncture sheath was placed.  The 018 wire was removed and a benson wire was placed.  The micropuncture sheath was exchanged for a 5 french sheath.  An omniflush catheter was advanced over the wire to the level of L-1.  An abdominal angiogram was obtained.  Next, using the omniflush catheter and a benson wire, the aortic bifurcation was crossed and the catheter was placed into theright external iliac artery and right runoff was obtained.  left runoff was performed via retrograde sheath injections. Findings:  Aortogram: No significant renal artery stenosis was identified.  The infrarenal abdominal aorta is widely patent.  Bilateral common and external iliac arteries are widely patent.  Right Lower Extremity: The right common femoral, profundofemoral, and superficial femoral artery are widely patent.  The popliteal artery is patent throughout its course it is heavily calcified with mild luminal  irregularity, stenosis approximately 50%.  There is a high-grade lesion at the origin of the anterior tibial artery, greater than 80%.  The tibial vessels become diffusely diseased as they go down to the foot with diffuse disease out onto the foot.  Left Lower Extremity: The left common femoral, profundofemoral, and superficial femoral artery are widely patent.  The popliteal artery is calcified but patent throughout its course.  The dominant runoff vessels the anterior tibial and peroneal artery Intervention: After the above images were acquired the decision made to proceed with intervention.  A 5 French sheath was advanced into the right external iliac artery.  The patient is fully heparinized.  Next using a V-18 wire and a 2.5 x 100 Sterling balloon, wire access was obtained into the anterior tibial artery down to the ankle.  I performed balloon angioplasty using multiple inflations at 40 atm from the ankle up through the anterior tibial artery into the popliteal artery at the level of the joint space.  Completion imaging showed significant improvement in the stenosis.  I elected to upsized to a 3 mm balloon to treat the popliteal artery and proximal anterior tibial artery.  Completion imaging was then performed which showed no residual high-grade lesions.  There remains diffuse disease out onto the foot.  The patient has been optimized from a vascular perspective.  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  Successfully treated using a 3 mm balloon.  #2  The patient has been optimized from a vascular perspective to proceed with palpitation tomorrow V. [name], M.D., [facility] [place] Office: [phone] Pager:  [phone]   - pertinent xrays, CT, MRI studies were reviewed and independently interpreted  Positive ROS: All other systems have been reviewed  and were otherwise negative with the exception of those mentioned in the HPI and as above.  Physical Exam: General: Alert, no acute distress Psychiatric: Patient is competent for consent with normal mood and affect Lymphatic: No axillary or cervical lymphadenopathy Cardiovascular: No pedal edema Respiratory: No cyanosis, no use of accessory musculature GI: No organomegaly, abdomen is soft and non-tender    Images:  _0 @  Labs:  Lab Results  Component Value Date   HGBA1C 5.4 04/15/2022   ESRSEDRATE 14 04/08/2022   CRP 2.1 04/08/2022    Lab Results  Component Value Date   ALBUMIN 3.9 08/27/2015        Latest  Ref Rng & Units 04/15/2022    8:37 AM 04/08/2022    4:21 PM 02/18/2022   10:37 AM  CBC EXTENDED  WBC 3.8 - 10.8 Thousand/uL  6.7    RBC 4.20 - 5.80 Million/uL  3.91    Hemoglobin 13.0 - 17.0 g/dL 12.9  13.3  11.9   HCT 39.0 - 52.0 % 38.0  38.2  35.0   Platelets 140 - 400 Thousand/uL  362    NEUT# 1,500 - 7,800 cells/uL  4,770    Lymph# 850 - 3,900 cells/uL  1,333      Neurologic: Patient does not have protective sensation bilateral lower extremities.   MUSCULOSKELETAL:   Skin: Examination patient has ischemic ulceration fifth metatarsal head right foot.  Patient has MRI scan and CT scan which confirms osteomyelitis of the fifth metatarsal head.  Patient is status post endovascular revascularization to the right lower extremity with balloon angioplasty of the popliteal artery and anterior tibial artery right lower extremity.  Assessment:  Osteomyelitis and ischemic ulcer right fifth metatarsal head status post revascularization yesterday.   Plan: Plan: We will plan for a right foot fifth ray amputation.  Risk and benefits were discussed including risk of the wound not healing need for additional surgery.  Patient states he understands wished to proceed at this time.  Thank you for the consult and the opportunity to see Mr. [place],  MD [facility] [phone] 6:58 AM

## 2022-04-16 NOTE — Progress Notes (Addendum)
  Progress Note    04/16/2022 7:28 AM 1 Day Post-Op  Subjective: says he had a lot of pain in left foot and kept " jumping" between 4-6 am. Just received some pain medication and it is starting to ease off   Vitals:   04/15/22 2352 04/16/22 0417  BP: (!) 124/58 (!) 121/56  Pulse: 69 66  Resp: 15 16  Temp: 99.9 F (37.7 C) 98.9 F (37.2 C)  SpO2: 100% 99%   Physical Exam: Cardiac:  regular Lungs:  non labored Extremities:  left groin access site c/d/I without swelling or hematoma. Doppler right AT/PT/ Peroneal signals. Right foot dressed Abdomen:  soft Neurologic: alert and oriented  CBC    Component Value Date/Time   WBC 6.7 04/08/2022 1621   RBC 3.91 (L) 04/08/2022 1621   HGB 12.9 (L) 04/15/2022 0837   HCT 38.0 (L) 04/15/2022 0837   PLT 362 04/08/2022 1621   MCV 97.7 04/08/2022 1621   MCH 34.0 (H) 04/08/2022 1621   MCHC 34.8 04/08/2022 1621   RDW 12.1 04/08/2022 1621   LYMPHSABS 1,333 04/08/2022 1621   EOSABS 60 04/08/2022 1621   BASOSABS 47 04/08/2022 1621    BMET    Component Value Date/Time   NA 139 04/15/2022 0837   K 5.3 (H) 04/15/2022 0837   CL 104 04/15/2022 0837   CO2 27 04/08/2022 1621   GLUCOSE 103 (H) 04/15/2022 0837   BUN 19 04/15/2022 0837   CREATININE 1.20 04/15/2022 0837   CREATININE 1.24 04/08/2022 1621   CALCIUM 9.6 04/08/2022 1621   GFRNONAA >60 09/06/2015 0445   GFRAA >60 09/06/2015 0445    INR    Component Value Date/Time   INR 1.00 08/27/2015 1331     Intake/Output Summary (Last 24 hours) at 04/16/2022 0728 Last data filed at 04/16/2022 0417 Gross per 24 hour  Intake 1618.13 ml  Output 1250 ml  Net 368.13 ml     Assessment/Plan:  66 y.o. male is s/p Aortogram, BLE runoff, Angioplasty Right popliteal artery, right AT 1 Day Post-Op   Left groin access site is c/d/I without swelling or hematoma Right leg is well perfused with AT/PT/Peroneal doppler signals Hemodynamically stable Pain control PRN To OR today with Dr. [name]  for right 5th ray amputation    Graceann [place], PA-C [facility] [phone] 04/16/2022 7:28 AM  Toe amputation by Dr. [name] today.  Anticipate discharge home tomorrow.  Durene Cal

## 2022-04-16 NOTE — Anesthesia Preprocedure Evaluation (Signed)
Anesthesia Evaluation  Patient identified by MRN, date of birth, ID band Patient awake    Reviewed: Allergy & Precautions, NPO status , Patient's Chart, lab work & pertinent test results  Airway Mallampati: II  TM Distance: >3 FB Neck ROM: Full    Dental no notable dental hx.    Pulmonary neg pulmonary ROS,    Pulmonary exam normal        Cardiovascular hypertension, Pt. on medications + Peripheral Vascular Disease   Rhythm:Regular Rate:Normal     Neuro/Psych negative neurological ROS  negative psych ROS   GI/Hepatic negative GI ROS, Neg liver ROS,   Endo/Other  negative endocrine ROS  Renal/GU negative Renal ROS  negative genitourinary   Musculoskeletal Osteomyelitis toe    Abdominal Normal abdominal exam  (+)   Peds  Hematology negative hematology ROS (+)   Anesthesia Other Findings   Reproductive/Obstetrics                             Anesthesia Physical Anesthesia Plan  ASA: 3  Anesthesia Plan: MAC   Post-op Pain Management:    Induction: Intravenous  PONV Risk Score and Plan: Ondansetron, Dexamethasone, Propofol infusion and Treatment may vary due to age or medical condition  Airway Management Planned: Simple Face Mask, Natural Airway and Nasal Cannula  Additional Equipment: None  Intra-op Plan:   Post-operative Plan:   Informed Consent: I have reviewed the patients History and Physical, chart, labs and discussed the procedure including the risks, benefits and alternatives for the proposed anesthesia with the patient or authorized representative who has indicated his/her understanding and acceptance.     Dental advisory given  Plan Discussed with: CRNA  Anesthesia Plan Comments: (Lab Results      Component                Value               Date                      WBC                      6.7                 04/08/2022                HGB                       12.9 (L)            04/15/2022                HCT                      38.0 (L)            04/15/2022                MCV                      97.7                04/08/2022                PLT                      362  04/08/2022           Lab Results      Component                Value               Date                      NA                       139                 04/15/2022                K                        5.3 (H)             04/15/2022                CO2                      27                  04/08/2022                GLUCOSE                  103 (H)             04/15/2022                BUN                      19                  04/15/2022                CREATININE               1.20                04/15/2022                CALCIUM                  9.6                 04/08/2022                EGFR                     64                  04/08/2022                GFRNONAA                 >60                 09/06/2015          )        Anesthesia Quick Evaluation

## 2022-04-16 NOTE — Anesthesia Postprocedure Evaluation (Signed)
Anesthesia Post Note  Patient: Clinton [name]  Procedure(s) Performed: RIGHT 5TH RAY AMPUTATION (Right)     Patient location during evaluation: PACU Anesthesia Type: MAC Level of consciousness: awake and alert Pain management: pain level controlled Vital Signs Assessment: post-procedure vital signs reviewed and stable Respiratory status: spontaneous breathing, nonlabored ventilation, respiratory function stable and patient connected to nasal cannula oxygen Cardiovascular status: stable and blood pressure returned to baseline Postop Assessment: no apparent nausea or vomiting Anesthetic complications: no   No notable events documented.  Last Vitals:  Vitals:   04/16/22 1320 04/16/22 1340  BP: (!) 123/51 (!) 128/52  Pulse: 83 78  Resp: 16 17  Temp: 36.9 C 36.9 C  SpO2: 97% 95%    Last Pain:  Vitals:   04/16/22 1340  TempSrc:   PainSc: 0-No pain                 Belenda Cruise P Kirtan Sada

## 2022-04-16 NOTE — Transfer of Care (Signed)
Immediate Anesthesia Transfer of Care Note  Patient: Clinton [name]  Procedure(s) Performed: RIGHT 5TH RAY AMPUTATION (Right)  Patient Location: PACU  Anesthesia Type:MAC  Level of Consciousness: patient cooperative  Airway & Oxygen Therapy: Patient Spontanous Breathing  Post-op Assessment: Report given to RN and Post -op Vital signs reviewed and stable  Post vital signs: Reviewed and stable  Last Vitals:  Vitals Value Taken Time  BP    Temp    Pulse    Resp    SpO2      Last Pain:  Vitals:   04/16/22 1013  TempSrc: Oral  PainSc:       Patients Stated Pain Goal: 3 (74/14/23 9532)  Complications: No notable events documented.

## 2022-04-16 NOTE — Op Note (Signed)
04/16/2022  1:23 PM  PATIENT:  Clinton [name]    PRE-OPERATIVE DIAGNOSIS:  Osteomyelitis Right 5th Toe  POST-OPERATIVE DIAGNOSIS:  Same  PROCEDURE:  RIGHT 5TH RAY AMPUTATION Application of Kerecis micro powder 38 cm. Local tissue rearrangement for wound closure 10 x 5 cm. Application of 13 cm Prevena wound VAC.   SURGEON:  Newt Minion, MD  PHYSICIAN ASSISTANT:None ANESTHESIA:   General  PREOPERATIVE INDICATIONS:  Clinton [name] is a  66 y.o. male with a diagnosis of Osteomyelitis Right 5th Toe who failed conservative measures and elected for surgical management.    The risks benefits and alternatives were discussed with the patient preoperatively including but not limited to the risks of infection, bleeding, nerve injury, cardiopulmonary complications, the need for revision surgery, among others, and the patient was willing to proceed.  OPERATIVE IMPLANTS: Kerecis micro powder 38 cm.  _0 @  OPERATIVE FINDINGS: Minimal petechial bleeding wound margins were clear.  OPERATIVE PROCEDURE: Patient was brought the operating room after undergoing a regional anesthetic.  A MAC anesthesia was then performed after adequate levels anesthesia were obtained patient's right lower extremity was prepped using DuraPrep draped into a sterile field a timeout was called.  A total of 20 cc of quarter percent Marcaine plain was also used for local infiltration.  A racquet incision was made around the ulcer fifth metatarsal head this left a wound that was 10 x 5 cm.  The fifth metatarsal was resected through the base with an oscillating saw.  There was minimal petechial bleeding the wound was irrigated with normal saline.  The wound was filled with 38 cm of Kerecis micro powder.  Local tissue rearrangement was used to close the wound 10 x 5 cm with 2-0 nylon.  A 13 cm Prevena wound VAC was applied this had a good suction fit this was overwrapped with Coban for the venous insufficiency.   Patient was taken the PACU in stable condition.   DISCHARGE PLANNING:  Antibiotic duration: Continue IV antibiotics 24 hours  Weightbearing: Touchdown weightbearing on the right  Pain medication: Low-dose opioid pathway  Dressing care/ Wound VAC: Continue wound VAC for 1 week  Ambulatory devices: Crutches or walker  Discharge to: Discharge planning based on therapy recommendations.  Follow-up: In the office 1 week post operative.

## 2022-04-17 ENCOUNTER — Encounter (HOSPITAL_COMMUNITY): Payer: Self-pay | Admitting: Orthopedic Surgery

## 2022-04-17 ENCOUNTER — Ambulatory Visit (HOSPITAL_BASED_OUTPATIENT_CLINIC_OR_DEPARTMENT_OTHER): Payer: 59 | Admitting: Internal Medicine

## 2022-04-17 DIAGNOSIS — I739 Peripheral vascular disease, unspecified: Secondary | ICD-10-CM | POA: Diagnosis present

## 2022-04-17 DIAGNOSIS — L97929 Non-pressure chronic ulcer of unspecified part of left lower leg with unspecified severity: Secondary | ICD-10-CM | POA: Diagnosis present

## 2022-04-17 DIAGNOSIS — Z7982 Long term (current) use of aspirin: Secondary | ICD-10-CM | POA: Diagnosis not present

## 2022-04-17 DIAGNOSIS — I10 Essential (primary) hypertension: Secondary | ICD-10-CM | POA: Diagnosis present

## 2022-04-17 DIAGNOSIS — I70248 Atherosclerosis of native arteries of left leg with ulceration of other part of lower left leg: Secondary | ICD-10-CM | POA: Diagnosis present

## 2022-04-17 DIAGNOSIS — Z882 Allergy status to sulfonamides status: Secondary | ICD-10-CM | POA: Diagnosis not present

## 2022-04-17 DIAGNOSIS — M868X7 Other osteomyelitis, ankle and foot: Secondary | ICD-10-CM | POA: Diagnosis present

## 2022-04-17 DIAGNOSIS — Z8249 Family history of ischemic heart disease and other diseases of the circulatory system: Secondary | ICD-10-CM | POA: Diagnosis not present

## 2022-04-17 DIAGNOSIS — Z79899 Other long term (current) drug therapy: Secondary | ICD-10-CM | POA: Diagnosis not present

## 2022-04-17 DIAGNOSIS — I872 Venous insufficiency (chronic) (peripheral): Secondary | ICD-10-CM | POA: Diagnosis present

## 2022-04-17 DIAGNOSIS — Z87891 Personal history of nicotine dependence: Secondary | ICD-10-CM | POA: Diagnosis not present

## 2022-04-17 DIAGNOSIS — N529 Male erectile dysfunction, unspecified: Secondary | ICD-10-CM | POA: Diagnosis present

## 2022-04-17 DIAGNOSIS — Z83438 Family history of other disorder of lipoprotein metabolism and other lipidemia: Secondary | ICD-10-CM | POA: Diagnosis not present

## 2022-04-17 DIAGNOSIS — E7800 Pure hypercholesterolemia, unspecified: Secondary | ICD-10-CM | POA: Diagnosis present

## 2022-04-17 DIAGNOSIS — Z880 Allergy status to penicillin: Secondary | ICD-10-CM | POA: Diagnosis not present

## 2022-04-17 DIAGNOSIS — L97519 Non-pressure chronic ulcer of other part of right foot with unspecified severity: Secondary | ICD-10-CM | POA: Diagnosis present

## 2022-04-17 DIAGNOSIS — Z7902 Long term (current) use of antithrombotics/antiplatelets: Secondary | ICD-10-CM | POA: Diagnosis not present

## 2022-04-17 DIAGNOSIS — I70235 Atherosclerosis of native arteries of right leg with ulceration of other part of foot: Secondary | ICD-10-CM | POA: Diagnosis present

## 2022-04-17 LAB — GLUCOSE, CAPILLARY
Glucose-Capillary: 103 mg/dL — ABNORMAL HIGH (ref 70–99)
Glucose-Capillary: 135 mg/dL — ABNORMAL HIGH (ref 70–99)
Glucose-Capillary: 96 mg/dL (ref 70–99)
Glucose-Capillary: 122 mg/dL — ABNORMAL HIGH (ref 70–99)

## 2022-04-17 MED ORDER — POTASSIUM CHLORIDE CRYS ER 20 MEQ PO TBCR: 20.0000 meq | EXTENDED_RELEASE_TABLET | Freq: Every day | ORAL | Status: DC | PRN

## 2022-04-17 MED ORDER — MORPHINE SULFATE (PF) 2 MG/ML IV SOLN: 0.5000 mg | INTRAVENOUS | Status: DC | PRN

## 2022-04-17 MED ORDER — BISACODYL 5 MG PO TBEC: 5.0000 mg | DELAYED_RELEASE_TABLET | Freq: Every day | ORAL | Status: DC | PRN

## 2022-04-17 MED ORDER — CYCLOBENZAPRINE HCL 10 MG PO TABS
5.0000 mg | ORAL_TABLET | Freq: Three times a day (TID) | ORAL | Status: DC | PRN
  Administered 2022-04-17 – 2022-04-19 (×4): 5 mg via ORAL
  Filled 2022-04-17 (×4): qty 1

## 2022-04-17 MED ORDER — METOPROLOL TARTRATE 5 MG/5ML IV SOLN: 2.0000 mg | INTRAVENOUS | Status: DC | PRN

## 2022-04-17 MED ORDER — MAGNESIUM SULFATE 2 GM/50ML IV SOLN: 2.0000 g | Freq: Every day | INTRAVENOUS | Status: DC | PRN

## 2022-04-17 MED ORDER — LABETALOL HCL 5 MG/ML IV SOLN: 10.0000 mg | INTRAVENOUS | Status: DC | PRN

## 2022-04-17 MED ORDER — POLYETHYLENE GLYCOL 3350 17 G PO PACK: 17.0000 g | PACK | Freq: Every day | ORAL | Status: DC | PRN

## 2022-04-17 MED ORDER — SODIUM CHLORIDE 0.9 % IV SOLN: INTRAVENOUS | Status: DC

## 2022-04-17 MED ORDER — GUAIFENESIN-DM 100-10 MG/5ML PO SYRP: 15.0000 mL | ORAL_SOLUTION | ORAL | Status: DC | PRN

## 2022-04-17 MED ORDER — ONDANSETRON HCL 4 MG/2ML IJ SOLN: 4.0000 mg | Freq: Four times a day (QID) | INTRAMUSCULAR | Status: DC | PRN

## 2022-04-17 MED ORDER — JUVEN PO PACK
1.0000 | PACK | Freq: Two times a day (BID) | ORAL | Status: DC
  Administered 2022-04-17 – 2022-04-22 (×11): 1 via ORAL
  Filled 2022-04-17 (×11): qty 1

## 2022-04-17 MED ORDER — HYDRALAZINE HCL 20 MG/ML IJ SOLN: 5.0000 mg | INTRAMUSCULAR | Status: DC | PRN

## 2022-04-17 MED ORDER — ACETAMINOPHEN 325 MG PO TABS: 325.0000 mg | ORAL_TABLET | Freq: Four times a day (QID) | ORAL | Status: DC | PRN

## 2022-04-17 MED ORDER — HYDROCODONE-ACETAMINOPHEN 5-325 MG PO TABS
1.0000 | ORAL_TABLET | ORAL | Status: DC | PRN
  Administered 2022-04-18: 1 via ORAL
  Filled 2022-04-17: qty 1
  Filled 2022-04-17: qty 2

## 2022-04-17 MED ORDER — VITAMIN C 500 MG PO TABS
1000.0000 mg | ORAL_TABLET | Freq: Every day | ORAL | Status: DC
  Administered 2022-04-17 – 2022-04-22 (×6): 1000 mg via ORAL
  Filled 2022-04-17 (×6): qty 2

## 2022-04-17 MED ORDER — MAGNESIUM CITRATE PO SOLN: 1.0000 | Freq: Once | ORAL | Status: DC | PRN

## 2022-04-17 MED ORDER — PHENOL 1.4 % MT LIQD: 1.0000 | OROMUCOSAL | Status: DC | PRN

## 2022-04-17 MED ORDER — DOCUSATE SODIUM 100 MG PO CAPS
100.0000 mg | ORAL_CAPSULE | Freq: Every day | ORAL | Status: DC
  Administered 2022-04-18 – 2022-04-22 (×2): 100 mg via ORAL
  Filled 2022-04-17 (×3): qty 1

## 2022-04-17 MED ORDER — HYDROCODONE-ACETAMINOPHEN 7.5-325 MG PO TABS
1.0000 | ORAL_TABLET | ORAL | Status: DC | PRN
  Administered 2022-04-17 – 2022-04-18 (×5): 2 via ORAL
  Administered 2022-04-18 (×2): 1 via ORAL
  Administered 2022-04-18 – 2022-04-19 (×2): 2 via ORAL
  Administered 2022-04-19 (×2): 1 via ORAL
  Administered 2022-04-19: 2 via ORAL
  Administered 2022-04-19 – 2022-04-22 (×8): 1 via ORAL
  Filled 2022-04-17 (×3): qty 1
  Filled 2022-04-17: qty 2
  Filled 2022-04-17 (×4): qty 1
  Filled 2022-04-17 (×2): qty 2
  Filled 2022-04-17 (×2): qty 1
  Filled 2022-04-17 (×2): qty 2
  Filled 2022-04-17: qty 1
  Filled 2022-04-17: qty 2
  Filled 2022-04-17: qty 1
  Filled 2022-04-17 (×2): qty 2
  Filled 2022-04-17 (×2): qty 1

## 2022-04-17 MED ORDER — ZINC SULFATE 220 (50 ZN) MG PO CAPS
220.0000 mg | ORAL_CAPSULE | Freq: Every day | ORAL | Status: DC
  Administered 2022-04-17 – 2022-04-22 (×6): 220 mg via ORAL
  Filled 2022-04-17 (×6): qty 1

## 2022-04-17 MED ORDER — ALUM & MAG HYDROXIDE-SIMETH 200-200-20 MG/5ML PO SUSP: 15.0000 mL | ORAL | Status: DC | PRN

## 2022-04-17 MED ORDER — PANTOPRAZOLE SODIUM 40 MG PO TBEC
40.0000 mg | DELAYED_RELEASE_TABLET | Freq: Every day | ORAL | Status: DC
  Administered 2022-04-17 – 2022-04-22 (×6): 40 mg via ORAL
  Filled 2022-04-17 (×6): qty 1

## 2022-04-17 MED ORDER — CEFAZOLIN SODIUM-DEXTROSE 2-4 GM/100ML-% IV SOLN
2.0000 g | Freq: Three times a day (TID) | INTRAVENOUS | Status: AC
  Administered 2022-04-17 (×2): 2 g via INTRAVENOUS
  Filled 2022-04-17 (×2): qty 100

## 2022-04-17 NOTE — Evaluation (Signed)
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 PMH includes HTN and BLE ulcers.  Clinical Impression  Pt admitted secondary to problem above with deficits below. Pt with increased pain and decreased strength and balance. Required mod A to stand and min A to pivot to chair using RW. Difficulty taking hop steps this session. Pt and wife voice concerns about going home given new deficits. Recommending AIR level therapies to increase independence and safety prior to d/c home. Will continue to follow acutely.        Recommendations for follow up therapy are one component of a multi-disciplinary discharge planning process, led by the attending physician.  Recommendations may be updated based on patient status, additional functional criteria and insurance authorization.  Follow Up Recommendations Acute inpatient rehab (3hours/day)      Assistance Recommended at Discharge Frequent or constant Supervision/Assistance  Patient can return home with the following  A lot of help with walking and/or transfers;A lot of help with bathing/dressing/bathroom;Assistance with cooking/housework;Help with stairs or ramp for entrance;Assist for transportation    Equipment Recommendations Other (comment) (TBD)  Recommendations for Other Services  Rehab consult;OT consult    Functional Status Assessment Patient has had a recent decline in their functional status and demonstrates the ability to make significant improvements in function in a reasonable and predictable amount of time.     Precautions / Restrictions Precautions Precautions: Fall Restrictions Weight Bearing Restrictions: Yes RLE Weight Bearing: Non weight bearing      Mobility  Bed Mobility Overal bed  mobility: Needs Assistance Bed Mobility: Supine to Sit     Supine to sit: Min assist     General bed mobility comments: Assist for RLE management    Transfers Overall transfer level: Needs assistance Equipment used: Rolling walker (2 wheels) Transfers: Sit to/from Stand, Bed to chair/wheelchair/BSC Sit to Stand: Mod assist Stand pivot transfers: Min assist         General transfer comment: Mod A for lift assist and steadying. Min A to take pivotal steps towards chair. Difficulty hopping on LLE. Able to maintain NWB on RLE appropriately    Ambulation/Gait                  Stairs            Wheelchair Mobility    Modified Rankin (Stroke Patients Only)       Balance Overall balance assessment: Needs assistance Sitting-balance support: No upper extremity supported, Feet supported Sitting balance-Leahy Scale: Fair     Standing balance support: Bilateral upper extremity supported, During functional activity Standing balance-Leahy Scale: Poor Standing balance comment: Reliant on UE and external support                             Pertinent Vitals/Pain Pain Assessment Pain Assessment: Faces Faces Pain Scale: Hurts whole lot Pain Location: RLE Pain Descriptors / Indicators: Grimacing, Guarding Pain Intervention(s): Limited activity within patient's tolerance, Monitored during session, Repositioned    Home Living Family/patient expects to be discharged to:: Private residence Living Arrangements: Spouse/significant other Available Help at Discharge: Family Type of Home: House Home Access: Stairs to enter Entrance Stairs-Rails: None Entrance Stairs-Number of Steps: 2   Home Layout: One  level Home Equipment: Other (comment) (knee scooter)      Prior Function Prior Level of Function : Independent/Modified Independent             Mobility Comments: Has been intermittently using knee scooter       Hand Dominance         Extremity/Trunk Assessment   Upper Extremity Assessment Upper Extremity Assessment: Defer to OT evaluation    Lower Extremity Assessment Lower Extremity Assessment: RLE deficits/detail RLE Deficits / Details: Deficits consistent with post op pain and weakness.    Cervical / Trunk Assessment Cervical / Trunk Assessment: Kyphotic  Communication   Communication: No difficulties  Cognition Arousal/Alertness: Awake/alert Behavior During Therapy: WFL for tasks assessed/performed Overall Cognitive Status: Impaired/Different from baseline Area of Impairment: Problem solving                             Problem Solving: Slow processing          General Comments      Exercises     Assessment/Plan    PT Assessment Patient needs continued PT services  PT Problem List Decreased strength;Decreased activity tolerance;Decreased balance;Decreased mobility;Decreased knowledge of use of DME;Decreased knowledge of precautions;Decreased safety awareness;Pain       PT Treatment Interventions DME instruction;Gait training;Stair training;Functional mobility training;Therapeutic activities;Therapeutic exercise;Balance training;Patient/family education    PT Goals (Current goals can be found in the Care Plan section)  Acute Rehab PT Goals Patient Stated Goal: to get stronger before going home PT Goal Formulation: With patient/family Time For Goal Achievement: 05/01/22 Potential to Achieve Goals: Good    Frequency Min 3X/week     Co-evaluation               AM-PAC PT "6 Clicks" Mobility  Outcome Measure Help needed turning from your back to your side while in a flat bed without using bedrails?: A Little Help needed moving from lying on your back to sitting on the side of a flat bed without using bedrails?: A Little Help needed moving to and from a bed to a chair (including a wheelchair)?: A Little Help needed standing up from a chair using your arms (e.g., wheelchair  or bedside chair)?: A Lot Help needed to walk in hospital room?: A Lot Help needed climbing 3-5 steps with a railing? : Total 6 Click Score: 14    End of Session Equipment Utilized During Treatment: Gait belt Activity Tolerance: Patient tolerated treatment well Patient left: in chair;with call bell/phone within reach;with chair alarm set Nurse Communication: Mobility status PT Visit Diagnosis: Unsteadiness on feet (R26.81);Muscle weakness (generalized) (M62.81);Difficulty in walking, not elsewhere classified (R26.2)    Time: 3643-8377 PT Time Calculation (min) (ACUTE ONLY): 18 min   Charges:   PT Evaluation $PT Eval Moderate Complexity: 1 Mod          Farley Ly, PT, DPT  Acute Rehabilitation Services  Office: [phone]   Lehman Prom 04/17/2022, 10:30 AM

## 2022-04-17 NOTE — Progress Notes (Signed)
Orthopedic Tech Progress Note Patient Details:  Clinton [name] 1956/04/12 [id number]  Ortho Devices Type of Ortho Device: Postop shoe/boot Ortho Device/Splint Location: RLE Ortho Device/Splint Interventions: Ordered      [name] [name] 04/17/2022, 3:13 PM

## 2022-04-17 NOTE — Progress Notes (Signed)
Mobility Specialist Progress Note:   04/17/22 1319  Mobility  Activity Transferred to/from [facility]  Level of Assistance Minimal assist, patient does 75% or more  Assistive Device  (HHA)  RLE Weight Bearing NWB  Distance Ambulated (ft) 2 ft  Activity Response Tolerated well  $Mobility charge 1 Mobility   Pt received in bed asking to use BSC. Min A to stand and pivot to [facility]. Left in bed with call bell in reach and all needs met.   [facility] [place] Mobility Specialist

## 2022-04-17 NOTE — Progress Notes (Signed)
Inpatient Rehab Admissions Coordinator:   PT recommending CIR and pt is now inpatient status.  I will place an order for assessment per our protocol.  Estill Dooms, PT, DPT Admissions Coordinator [phone] 04/17/22  1:43 PM

## 2022-04-17 NOTE — Progress Notes (Addendum)
  Progress Note    04/17/2022 7:58 AM 1 Day Post-Op  Subjective:  Right foot pain. Just received pain medication so helping some   Vitals:   04/16/22 2340 04/17/22 0343  BP: 134/63 (!) 144/70  Pulse: 84 88  Resp: 16 18  Temp: 98.7 F (37.1 C) 98.5 F (36.9 C)  SpO2: 97% 95%   Physical Exam: Cardiac:  regular Lungs:  non labored Incisions:  left groin c/d/I without hematoma/ swelling. Right foot dressed Extremities:  well perfused and warm Abdomen:  soft Neurologic: alert and oriented  CBC    Component Value Date/Time   WBC 6.7 04/08/2022 1621   RBC 3.91 (L) 04/08/2022 1621   HGB 12.9 (L) 04/15/2022 0837   HCT 38.0 (L) 04/15/2022 0837   PLT 362 04/08/2022 1621   MCV 97.7 04/08/2022 1621   MCH 34.0 (H) 04/08/2022 1621   MCHC 34.8 04/08/2022 1621   RDW 12.1 04/08/2022 1621   LYMPHSABS 1,333 04/08/2022 1621   EOSABS 60 04/08/2022 1621   BASOSABS 47 04/08/2022 1621    BMET    Component Value Date/Time   NA 139 04/15/2022 0837   K 5.3 (H) 04/15/2022 0837   CL 104 04/15/2022 0837   CO2 27 04/08/2022 1621   GLUCOSE 103 (H) 04/15/2022 0837   BUN 19 04/15/2022 0837   CREATININE 1.20 04/15/2022 0837   CREATININE 1.24 04/08/2022 1621   CALCIUM 9.6 04/08/2022 1621   GFRNONAA >60 09/06/2015 0445   GFRAA >60 09/06/2015 0445    INR    Component Value Date/Time   INR 1.00 08/27/2015 1331     Intake/Output Summary (Last 24 hours) at 04/17/2022 0758 Last data filed at 04/16/2022 2356 Gross per 24 hour  Intake 840 ml  Output 875 ml  Net -35 ml     Assessment/Plan:  66 y.o. 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 [name] 1 Day Post-Op   Pain control as needed Left groin access site c/d/I Wound care for right foot per Dr. [name] Non weight bearing Right foot He has follow up in 1 week with Dr. [name] PT to eval with recs D/c when pain better controlled possibly  tomorrow   Graceann Congress, PA-C [facility] [phone] 04/17/2022 7:58 AM  Agree with the above.  Needs to stay another night for pain control  Wells Mckynleigh Mussell

## 2022-04-17 NOTE — Progress Notes (Signed)
Inpatient Rehab Admissions Coordinator:   Consult received and chart reviewed.  Noted pt is observation status at this time. Pt may not have the medical necessity to warrant an inpatient rehab stay if they remain observation. If pt were to qualify for inpatient status, AC will screen for candidacy. At this time, appears pt cleared to d/c home.  Will sign off.   Estill Dooms, PT, DPT Admissions Coordinator [phone] 04/17/22  9:46 AM

## 2022-04-17 NOTE — Progress Notes (Signed)
Patient ID: Clinton [name], male   DOB: [birth year], 66 y.o.   MRN: [id number] Patient is postoperative day 1 right foot fifth ray amputation.  Patient ambulated immediately after surgery did cause some bleeding into the wound VAC canister.  There is no active bleeding at this time.  Discussed with the patient and his wife that there was minimal microcirculation.  Discussed the importance of nonweightbearing elevation and discussed diet.  Patient may discharge to home when he is safe with therapy.  I will follow-up in the office in 1 week.

## 2022-04-18 LAB — CBC
HCT: 33.2 % — ABNORMAL LOW (ref 39.0–52.0)
Hemoglobin: 11.1 g/dL — ABNORMAL LOW (ref 13.0–17.0)
MCH: 33.5 pg (ref 26.0–34.0)
MCHC: 33.4 g/dL (ref 30.0–36.0)
MCV: 100.3 fL — ABNORMAL HIGH (ref 80.0–100.0)
Platelets: 197 10*3/uL (ref 150–400)
RBC: 3.31 MIL/uL — ABNORMAL LOW (ref 4.22–5.81)
RDW: 11.9 % (ref 11.5–15.5)
WBC: 8.8 10*3/uL (ref 4.0–10.5)
nRBC: 0 % (ref 0.0–0.2)

## 2022-04-18 LAB — BASIC METABOLIC PANEL
Anion gap: 10 (ref 5–15)
BUN: 18 mg/dL (ref 8–23)
CO2: 24 mmol/L (ref 22–32)
Calcium: 9.1 mg/dL (ref 8.9–10.3)
Chloride: 103 mmol/L (ref 98–111)
Creatinine, Ser: 1.19 mg/dL (ref 0.61–1.24)
GFR, Estimated: >60 mL/min (ref >60)
Glucose, Bld: 102 mg/dL — ABNORMAL HIGH (ref 70–99)
Potassium: 4 mmol/L (ref 3.5–5.1)
Sodium: 137 mmol/L (ref 135–145)

## 2022-04-18 LAB — GLUCOSE, CAPILLARY
Glucose-Capillary: 104 mg/dL — ABNORMAL HIGH (ref 70–99)
Glucose-Capillary: 129 mg/dL — ABNORMAL HIGH (ref 70–99)
Glucose-Capillary: 120 mg/dL — ABNORMAL HIGH (ref 70–99)
Glucose-Capillary: 109 mg/dL — ABNORMAL HIGH (ref 70–99)

## 2022-04-18 NOTE — Progress Notes (Signed)
Pt having a significant amount of breakthrough pain in LLE after transferring from chair to [facility] and then back to bed.  Foot warm, pulses present.  Additional dose of pain medication given, see MAR.

## 2022-04-18 NOTE — Progress Notes (Signed)
  Progress Note    04/18/2022 7:51 AM 2 Days Post-Op  Subjective:  finally slept. Still requiring pain medication q4 hrs   Vitals:   04/17/22 2359 04/18/22 0529  BP: (!) 157/76 (!) 153/62  Pulse: 93 78  Resp: 17 12  Temp: 98.6 F (37 C) 98.8 F (37.1 C)  SpO2: 97% 97%   Physical Exam: Cardiac:  regular Lungs:  non labored Incisions:  Right foot with Wound VAC to suction. R foot dressed Extremities:  well perfused and warm Neurologic: alert and oriented  CBC    Component Value Date/Time   WBC 8.8 04/18/2022 0612   RBC 3.31 (L) 04/18/2022 0612   HGB 11.1 (L) 04/18/2022 0612   HCT 33.2 (L) 04/18/2022 0612   PLT 197 04/18/2022 0612   MCV 100.3 (H) 04/18/2022 0612   MCH 33.5 04/18/2022 0612   MCHC 33.4 04/18/2022 0612   RDW 11.9 04/18/2022 0612   LYMPHSABS 1,333 04/08/2022 1621   EOSABS 60 04/08/2022 1621   BASOSABS 47 04/08/2022 1621    BMET    Component Value Date/Time   NA 139 04/15/2022 0837   K 5.3 (H) 04/15/2022 0837   CL 104 04/15/2022 0837   CO2 27 04/08/2022 1621   GLUCOSE 103 (H) 04/15/2022 0837   BUN 19 04/15/2022 0837   CREATININE 1.20 04/15/2022 0837   CREATININE 1.24 04/08/2022 1621   CALCIUM 9.6 04/08/2022 1621   GFRNONAA >60 09/06/2015 0445   GFRAA >60 09/06/2015 0445    INR    Component Value Date/Time   INR 1.00 08/27/2015 1331     Intake/Output Summary (Last 24 hours) at 04/18/2022 0751 Last data filed at 04/18/2022 0530 Gross per 24 hour  Intake --  Output 1475 ml  Net -1475 ml     Assessment/Plan:  66 y.o. 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 [name] 2 Days Post-Op   Continue multimodal pain management PT recommending CIR. Will await further evaluation today Pt wants to determine home with [facility] PT vs CIR. Likely d/c later this afternoon pending recs/pt decision Will follow up with Dr. [name] in 1 week Non weight bearing R foot. Has home DME  /scooter Will arrange follow up in 2-3 weeks with ABI   Graceann Congress, PA-C [facility] [phone] 04/18/2022 7:51 AM

## 2022-04-18 NOTE — Progress Notes (Signed)
Inpatient Rehab Coordinator Note:  I met with patient and wife at bedside to discuss CIR recommendations and goals/expectations of CIR stay.  We reviewed 3 hrs/day of therapy, physician follow up, and average length of stay 2 weeks (dependent upon progress) with goals of mod I. Wife states she is available 24/7 to provide up to min A. They are in agreement to pursue CIR and submit to insurance for prior authorization for potential admit to CIR.   Rehab Admissons Coordinator [place], [place], [place] [phone]

## 2022-04-18 NOTE — Evaluation (Signed)
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 PMH includes HTN and BLE ulcers.   Clinical Impression   Pt admitted with the above diagnoses and presents with below problem list. Pt will benefit from continued acute OT to address the below listed deficits and maximize independence with basic ADLs prior to d/c to venue below. At baseline pt is independent with ADLs, recently had been using R knee scooter at home leading up to surgery. Pt currently needs up to mod A with LB ADLs and functional transfers. Spouse present throughout session.       Recommendations for follow up therapy are one component of a multi-disciplinary discharge planning process, led by the attending physician.  Recommendations may be updated based on patient status, additional functional criteria and insurance authorization.   Follow Up Recommendations  Acute inpatient rehab (3hours/day)    Assistance Recommended at Discharge Intermittent Supervision/Assistance  Patient can return home with the following A little help with walking and/or transfers;A little help with bathing/dressing/bathroom;Assistance with cooking/housework;Assist for transportation    Functional Status Assessment  Patient has had a recent decline in their functional status and demonstrates the ability to make significant improvements in function in a reasonable and predictable amount of time.  Equipment Recommendations  Other (comment) (defer to next venue)    Recommendations for Other Services       Precautions / Restrictions Precautions Precautions: Fall Restrictions Weight Bearing Restrictions: Yes RLE Weight Bearing: Non weight bearing Other Position/Activity Restrictions: order set says TDWB but  pt reports [name] told him NWB for the first week. Sent secure chat to [place] seeking clarification.      Mobility Bed Mobility Overal bed mobility: Needs Assistance Bed Mobility: Supine to Sit     Supine to sit: Min assist, Min guard     General bed mobility comments: assist to pivot to full EOB position    Transfers Overall transfer level: Needs assistance Equipment used: Rolling walker (2 wheels) Transfers: Sit to/from Stand, Bed to chair/wheelchair/BSC Sit to Stand: Mod assist Stand pivot transfers: Min assist         General transfer comment: mod A to powerup and steady also to control descent into chair. min A to steady while pivoting to chair. Able to maintain RLE NWB but struggles. LLE sore as well.      Balance Overall balance assessment: Needs assistance Sitting-balance support: No upper extremity supported, Feet supported Sitting balance-Leahy Scale: Fair Sitting balance - Comments: tends to sit with at least single UE support   Standing balance support: Bilateral upper extremity supported, During functional activity Standing balance-Leahy Scale: Poor Standing balance comment: rw and min A to steady whilw maintaining NWB RLE                           ADL either performed or assessed with clinical judgement   ADL Overall ADL's : Needs assistance/impaired Eating/Feeding: Set up   Grooming: Set up;Sitting   Upper Body Bathing: Set up;Sitting   Lower Body Bathing: Moderate assistance;Sit to/from stand   Upper Body Dressing : Set up;Sitting   Lower Body Dressing: Moderate assistance;Sit to/from stand   Toilet Transfer: Moderate assistance;Stand-pivot;BSC/3in1;Rolling walker (2 wheels) Toilet Transfer Details (indicate cue type and reason):  simulated with bed to chair Toileting- Clothing Manipulation and Hygiene: Moderate assistance;Sit to/from stand         General ADL Comments: Pt completed bed mobility then took pivotal steps to access  recliner. Educated on rw technique.     Vision         Perception     Praxis      Pertinent Vitals/Pain Pain Assessment Pain Assessment: Faces Faces Pain Scale: Hurts even more Pain Location: RLE after transfer bed to chair Pain Descriptors / Indicators: Grimacing, Guarding Pain Intervention(s): Limited activity within patient's tolerance, Monitored during session, Repositioned, Premedicated before session     Hand Dominance     Extremity/Trunk Assessment Upper Extremity Assessment Upper Extremity Assessment: Overall WFL for tasks assessed;Generalized weakness   Lower Extremity Assessment Lower Extremity Assessment: Defer to PT evaluation   Cervical / Trunk Assessment Cervical / Trunk Assessment: Kyphotic   Communication Communication Communication: No difficulties   Cognition Arousal/Alertness: Awake/alert Behavior During Therapy: Flat affect, WFL for tasks assessed/performed Overall Cognitive Status: Impaired/Different from baseline Area of Impairment: Problem solving                             Problem Solving: Slow processing       General Comments  Spouse present throughout session.    Exercises     Shoulder Instructions      Home Living Family/patient expects to be discharged to:: Private residence Living Arrangements: Spouse/significant other Available Help at Discharge: Family Type of Home: House Home Access: Stairs to enter Secretary/administrator of Steps: 2 Entrance Stairs-Rails: None Home Layout: One level     Bathroom Shower/Tub: Producer, television/film/video: Standard     Home Equipment: Other (comment) (knee scooter)          Prior Functioning/Environment Prior Level of Function : Independent/Modified Independent             Mobility Comments: Has been intermittently using knee scooter          OT Problem List: Decreased strength;Decreased activity tolerance;Impaired balance (sitting and/or  standing);Decreased safety awareness;Decreased knowledge of use of DME or AE;Decreased knowledge of precautions;Pain      OT Treatment/Interventions: Self-care/ADL training;DME and/or AE instruction;Therapeutic activities;Patient/family education;Balance training    OT Goals(Current goals can be found in the care plan section) Acute Rehab OT Goals Patient Stated Goal: home OT Goal Formulation: With patient/family Time For Goal Achievement: 05/02/22 Potential to Achieve Goals: Good ADL Goals Pt Will Perform Lower Body Bathing: with modified independence;sit to/from stand;sitting/lateral leans Pt Will Perform Lower Body Dressing: with modified independence;sit to/from stand;sitting/lateral leans Pt Will Transfer to Toilet: with modified independence;ambulating Pt Will Perform Toileting - Clothing Manipulation and hygiene: with modified independence;sitting/lateral leans;sit to/from stand  OT Frequency: Min 2X/week    Co-evaluation              AM-PAC OT "6 Clicks" Daily Activity     Outcome Measure Help from another person eating meals?: None Help from another person taking care of personal grooming?: None Help from another person toileting, which includes using toliet, bedpan, or urinal?: A Lot Help from another person bathing (including washing, rinsing, drying)?: A Lot Help from another person to put on and taking off regular upper body clothing?: None Help from another person to put on and taking off regular lower body clothing?: A Lot 6 Click Score: 18   End of Session Equipment Utilized During Treatment:  Rolling walker (2 wheels) Nurse Communication: Weight bearing status;Mobility status  Activity Tolerance: Patient limited by pain;Patient limited by fatigue Patient left: in chair;with call bell/phone within reach;with family/visitor present  OT Visit Diagnosis: Unsteadiness on feet (R26.81);Muscle weakness (generalized) (M62.81);Pain Pain - Right/Left: Right Pain - part  of body: Ankle and joints of foot                Time: 1033-1100 OT Time Calculation (min): 27 min Charges:  OT General Charges $OT Visit: 1 Visit OT Evaluation $OT Eval Moderate Complexity: 1 Mod OT Treatments $Self Care/Home Management : 8-22 mins  Raynald Kemp, OT Acute Rehabilitation Services Office: [phone]   Pilar Grammes 04/18/2022, 11:28 AM

## 2022-04-18 NOTE — Discharge Instructions (Signed)
  [facility]   Discharge Instructions  Lower Extremity Angiogram; Angioplasty/Stenting  Please refer to the following instructions for your post-procedure care. Your surgeon or physician assistant will discuss any changes with you.  Activity  Avoid lifting more than 8 pounds (1 gallons of milk) for 5 days after your procedure. You may walk as much as you can tolerate. It's OK to drive after 72 hours.  Bathing/Showering  You may shower the day after your procedure. If you have a bandage, you may remove it at 24- 48 hours. Clean your incision site with mild soap and water. Pat the area dry with a clean towel.  Diet  Resume your pre-procedure diet. There are no special food restrictions following this procedure. All patients with peripheral vascular disease should follow a low fat/low cholesterol diet. In order to heal from your surgery, it is CRITICAL to get adequate nutrition. Your body requires vitamins, minerals, and protein. Vegetables are the best source of vitamins and minerals. Vegetables also provide the perfect balance of protein. Processed food has little nutritional value, so try to avoid this.  Medications  Resume taking all of your medications unless your doctor tells you not to. If your incision is causing pain, you may take over-the-counter pain relievers such as acetaminophen (Tylenol)  Follow Up  Follow up will be arranged at the time of your procedure. You may have an office visit scheduled or may be scheduled for surgery. Ask your surgeon if you have any questions.  Please call us immediately for any of the following conditions: .Severe or worsening pain your legs or feet at rest or with walking. .Increased pain, redness, drainage at your groin puncture site. .Fever of 101 degrees or higher. .If you have any mild or slow bleeding from your puncture site: lie down, apply firm constant pressure over the area with a piece of gauze or a  clean wash cloth for 30 minutes- no peeking!, call 911 right away if you are still bleeding after 30 minutes, or if the bleeding is heavy and unmanageable.  Reduce your risk factors of vascular disease:  . Stop smoking. If you would like help call [facility] at 1-800-QUIT-NOW (1-800-784-8669) or [place] at 336-586-4000. . Manage your cholesterol . Maintain a desired weight . Control your diabetes . Keep your blood pressure down .  If you have any questions, please call the office at [phone] 

## 2022-04-19 LAB — BASIC METABOLIC PANEL
Anion gap: 8 (ref 5–15)
BUN: 20 mg/dL (ref 8–23)
CO2: 26 mmol/L (ref 22–32)
Calcium: 8.8 mg/dL — ABNORMAL LOW (ref 8.9–10.3)
Chloride: 104 mmol/L (ref 98–111)
Creatinine, Ser: 1.15 mg/dL (ref 0.61–1.24)
GFR, Estimated: >60 mL/min (ref >60)
Glucose, Bld: 112 mg/dL — ABNORMAL HIGH (ref 70–99)
Potassium: 3.8 mmol/L (ref 3.5–5.1)
Sodium: 138 mmol/L (ref 135–145)

## 2022-04-19 LAB — CBC
HCT: 30.7 % — ABNORMAL LOW (ref 39.0–52.0)
Hemoglobin: 10.3 g/dL — ABNORMAL LOW (ref 13.0–17.0)
MCH: 32.8 pg (ref 26.0–34.0)
MCHC: 33.6 g/dL (ref 30.0–36.0)
MCV: 97.8 fL (ref 80.0–100.0)
Platelets: 174 10*3/uL (ref 150–400)
RBC: 3.14 MIL/uL — ABNORMAL LOW (ref 4.22–5.81)
RDW: 11.9 % (ref 11.5–15.5)
WBC: 7.8 10*3/uL (ref 4.0–10.5)
nRBC: 0 % (ref 0.0–0.2)

## 2022-04-19 LAB — GLUCOSE, CAPILLARY: Glucose-Capillary: 107 mg/dL — ABNORMAL HIGH (ref 70–99)

## 2022-04-19 NOTE — Progress Notes (Addendum)
  Progress Note    04/19/2022 8:52 AM 3 Days Post-Op  Subjective:  Feeling better overall in regard to pain management. Did have some pain overnight that kept him up until pain medication kicked in. Sitting up in chair. Prevena VAC alarming due to "blockage"   Vitals:   04/19/22 0436 04/19/22 0750  BP: (!) 152/69 117/65  Pulse: 71 87  Resp: 16 13  Temp: 98.6 F (37 C) 98.3 F (36.8 C)  SpO2: 92% 97%   Physical Exam: Cardiac:  regular Lungs:  non labored Incisions:  Right foot dressed. Prevena VAC alarming Extremities:  well perfused and warm  Neurologic: Alert and oriented  CBC    Component Value Date/Time   WBC 7.8 04/19/2022 0223   RBC 3.14 (L) 04/19/2022 0223   HGB 10.3 (L) 04/19/2022 0223   HCT 30.7 (L) 04/19/2022 0223   PLT 174 04/19/2022 0223   MCV 97.8 04/19/2022 0223   MCH 32.8 04/19/2022 0223   MCHC 33.6 04/19/2022 0223   RDW 11.9 04/19/2022 0223   LYMPHSABS 1,333 04/08/2022 1621   EOSABS 60 04/08/2022 1621   BASOSABS 47 04/08/2022 1621    BMET    Component Value Date/Time   NA 138 04/19/2022 0223   K 3.8 04/19/2022 0223   CL 104 04/19/2022 0223   CO2 26 04/19/2022 0223   GLUCOSE 112 (H) 04/19/2022 0223   BUN 20 04/19/2022 0223   CREATININE 1.15 04/19/2022 0223   CREATININE 1.24 04/08/2022 1621   CALCIUM 8.8 (L) 04/19/2022 0223   GFRNONAA >60 04/19/2022 0223   GFRAA >60 09/06/2015 0445    INR    Component Value Date/Time   INR 1.00 08/27/2015 1331     Intake/Output Summary (Last 24 hours) at 04/19/2022 0852 Last data filed at 04/19/2022 0439 Gross per 24 hour  Intake 3 ml  Output 850 ml  Net -847 ml     Assessment/Plan:  66 y.o. 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 [name]  3 Days Post-Op   Continues to require a lot of pain medication but slowly improving PT/ OT recommended CIR. Pending Authorization Patient and his wife still undecided about  home vs CIR but want to wait to see if they will get authorization before deciding Outpatient follow up with Dr. [name] on Tuesday Non weight bearing right foot Prevena VAC not working. Ortho to check on and possibly discontinue today   Graceann Congress, [place] [facility] [phone] 04/19/2022 8:52 AM VASCULAR STAFF ADDENDUM: I have independently interviewed and examined the patient. I agree with the above.  Feels better than yesterday VAC without suction - dressing per Dr. [name]  CIR v Home  Fara Olden, MD [facility] Phone Number: [phone] 04/19/2022 10:09 AM

## 2022-04-19 NOTE — Progress Notes (Signed)
[name] canister replaced and vac now getting good suction, no error alarms.  Replaced coban dressing and applied up to pt's knee to reduce LLE swelling as per Dr. Audrie Lia request.  Pt reports a significant decrease in pain today. He's been up to chair for 2+hrs and up to [facility] twice.  Now back to bed resting comfortably.

## 2022-04-19 NOTE — Progress Notes (Signed)
Patient ID: Clinton [name], male   DOB: [birth year], 66 y.o.   MRN: [id number] Patient is status post right fifth ray amputation.  Patient does have venous swelling and will have the Coban wrap rewrapped up to the tibial tubercle.  The wound VAC canister is full and patient will need a new canister for the [name] plus wound VAC.  Discharge planning based on therapy recommendations.

## 2022-04-19 NOTE — Progress Notes (Signed)
Night shift RN states previna wound vac has been alarming occluded throughout the night.  Line looks clear, attempted to reposition at wound site and also reposition pt foot.  Alarm persists.  Vascular PA making rounds this morning, deferring to orthopedics.  Paged answering service, awaiting call back.  Vac turned off at this time.

## 2022-04-20 NOTE — Progress Notes (Signed)
Pt assisted with bathing and then up to recliner for breakfast.  Transferring well with post op shoe and front wheel walker.

## 2022-04-20 NOTE — Progress Notes (Signed)
Unable to flush IV due to occlusion.  No IV meds ordered at this time.  Per vascular PA, okay for no vascular access at this time.

## 2022-04-20 NOTE — Progress Notes (Addendum)
  Progress Note    04/20/2022 8:19 AM 4 Days Post-Op  Subjective:  feeling better. Less pain yesterday and overnight. Feels with dressing change yesterday it helped pain a lot   Vitals:   04/20/22 0352 04/20/22 0735  BP: (!) 143/57 (!) 149/62  Pulse: 73   Resp: 15 16  Temp: 99 F (37.2 C) 98.4 F (36.9 C)  SpO2: 95% 96%   Physical Exam: Cardiac:  regular Lungs:  non labored Incisions:  Right foot dressed Extremities:  well perfused and warm Neurologic: alert and oriented  CBC    Component Value Date/Time   WBC 7.8 04/19/2022 0223   RBC 3.14 (L) 04/19/2022 0223   HGB 10.3 (L) 04/19/2022 0223   HCT 30.7 (L) 04/19/2022 0223   PLT 174 04/19/2022 0223   MCV 97.8 04/19/2022 0223   MCH 32.8 04/19/2022 0223   MCHC 33.6 04/19/2022 0223   RDW 11.9 04/19/2022 0223   LYMPHSABS 1,333 04/08/2022 1621   EOSABS 60 04/08/2022 1621   BASOSABS 47 04/08/2022 1621    BMET    Component Value Date/Time   NA 138 04/19/2022 0223   K 3.8 04/19/2022 0223   CL 104 04/19/2022 0223   CO2 26 04/19/2022 0223   GLUCOSE 112 (H) 04/19/2022 0223   BUN 20 04/19/2022 0223   CREATININE 1.15 04/19/2022 0223   CREATININE 1.24 04/08/2022 1621   CALCIUM 8.8 (L) 04/19/2022 0223   GFRNONAA >60 04/19/2022 0223   GFRAA >60 09/06/2015 0445    INR    Component Value Date/Time   INR 1.00 08/27/2015 1331     Intake/Output Summary (Last 24 hours) at 04/20/2022 0819 Last data filed at 04/20/2022 0739 Gross per 24 hour  Intake 240 ml  Output 850 ml  Net -610 ml     Assessment/Plan:  66 y.o. 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 [name]   4 Days Post-Op   Pain control much better Pravena VAC replaced yesterday and Coban wrap up right leg to aid swelling Awaiting insurance auth for CIR vs home with [facility] PT CIR vs Home possibly tomorrow   Graceann Congress, [place] [facility] [phone] 04/20/2022 8:19 AM  VASCULAR STAFF ADDENDUM: I have independently interviewed and examined the patient. I agree with the above.    Fara Olden, MD [facility] Phone Number: [phone] 04/20/2022 1:17 PM

## 2022-04-20 NOTE — Progress Notes (Signed)
Mobility Specialist - Progress Note   04/20/22 1244  Mobility  Activity Transferred to/from [facility]  Level of Assistance Minimal assist, patient does 75% or more  Assistive Device Front wheel walker  RLE Weight Bearing NWB  Distance Ambulated (ft) 4 ft  Activity Response Tolerated well  $Mobility charge 1 Mobility    Pt was received on BCS and requesting to get back in chair. After assisting in peri care pt was returned to chair with all needs met.   Larey Seat

## 2022-04-21 ENCOUNTER — Ambulatory Visit: Payer: 59 | Admitting: Surgery

## 2022-04-21 ENCOUNTER — Telehealth: Payer: Self-pay | Admitting: Orthopedic Surgery

## 2022-04-21 ENCOUNTER — Encounter (HOSPITAL_COMMUNITY): Payer: 59

## 2022-04-21 NOTE — Progress Notes (Signed)
Mobility Specialist Progress Note:   04/21/22 0940  Mobility  Activity Transferred to/from [facility]  Level of Assistance Standby assist, set-up cues, supervision of patient - no hands on  Assistive Device Front wheel walker  Distance Ambulated (ft) 4 ft  Activity Response Tolerated well  $Mobility charge 1 Mobility   Pt received on BSC, Required peri-care. Left in chair with call bell in reach and all needs met.   [facility] [place] Mobility Specialist

## 2022-04-21 NOTE — Progress Notes (Signed)
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 PMH includes HTN and BLE ulcers.   OT comments  Pt progressing towards goals this session, needing min guard-min A for LB ADLs, and min guard for transfers with RW. Pt mildly impulsive with mobility, needing min cues for safety with transfers, pt adhering well to RLE NWB status throughout session. Educated pt to start getting up to bathroom with assistance  vs using urinal in order to increase UB strength and activity tolerance for ADLs, pt and family member verbalized understanding. Pt presenting with impairments listed below, will follow acutely. Continue to recommend AIR at d/c.   Recommendations for follow up therapy are one component of a multi-disciplinary discharge planning process, led by the attending physician.  Recommendations may be updated based on patient status, additional functional criteria and insurance authorization.    Follow Up Recommendations  Acute inpatient rehab (3hours/day)    Assistance Recommended at Discharge Intermittent Supervision/Assistance  Patient can return home with the following  A little help with walking and/or transfers;A little help with bathing/dressing/bathroom;Assistance with cooking/housework;Assist for transportation   Equipment Recommendations  Other (comment) (defer to next venue of care)    Recommendations for Other Services      Precautions / Restrictions Precautions Precautions: Fall Restrictions Weight Bearing Restrictions: Yes RLE Weight Bearing: Non weight bearing Other Position/Activity Restrictions: order set says TDWB but pt reports [name] told him NWB for the first week. Sent secure chat to [facility] seeking clarification.       Mobility Bed  Mobility               General bed mobility comments: OOB in chair upon arrival    Transfers Overall transfer level: Needs assistance Equipment used: Rolling walker (2 wheels) Transfers: Sit to/from Stand Sit to Stand: Min guard           General transfer comment: mildly impulsive, standing before instructed     Balance Overall balance assessment: Needs assistance Sitting-balance support: No upper extremity supported, Feet supported Sitting balance-Leahy Scale: Good Sitting balance - Comments: can reach outside BOS without LOB   Standing balance support: Bilateral upper extremity supported, During functional activity Standing balance-Leahy Scale: Poor Standing balance comment: reliant on external support                           ADL either performed or assessed with clinical judgement   ADL Overall ADL's : Needs assistance/impaired                     Lower Body Dressing: Minimal assistance;Sitting/lateral leans Lower Body Dressing Details (indicate cue type and reason): min A for simulated LB dressing for RLE, limited ROM due to stiffness per pt Toilet Transfer: Min guard;Rolling walker (2 wheels);Ambulation;Regular Teacher, adult education Details (indicate cue type and reason): simulated via functional mobility         Functional mobility during ADLs: Min guard;Rolling walker (2 wheels)      Extremity/Trunk Assessment Upper Extremity Assessment Upper Extremity Assessment: Overall WFL for tasks assessed   Lower Extremity Assessment Lower Extremity Assessment: Defer to PT evaluation        Vision   Vision Assessment?: No apparent visual deficits  Perception Perception Perception: Not tested   Praxis Praxis Praxis: Not tested    Cognition Arousal/Alertness: Awake/alert Behavior During Therapy: Flat affect, WFL for tasks assessed/performed Overall Cognitive Status: Impaired/Different from baseline Area of Impairment: Problem  solving                             Problem Solving: Slow processing          Exercises      Shoulder Instructions       General Comments VSS on RA, spouse present during session    Pertinent Vitals/ Pain       Pain Assessment Pain Assessment: Faces Pain Score: 4  Faces Pain Scale: Hurts little more Pain Location: RLE Pain Descriptors / Indicators: Discomfort Pain Intervention(s): Limited activity within patient's tolerance, Monitored during session, Repositioned  Home Living                                          Prior Functioning/Environment              Frequency  Min 2X/week        Progress Toward Goals  OT Goals(current goals can now be found in the care plan section)  Progress towards OT goals: Progressing toward goals  Acute Rehab OT Goals Patient Stated Goal: to go to rehab OT Goal Formulation: With patient/family Time For Goal Achievement: 05/02/22 Potential to Achieve Goals: Good ADL Goals Pt Will Perform Lower Body Bathing: with modified independence;sit to/from stand;sitting/lateral leans Pt Will Perform Lower Body Dressing: with modified independence;sit to/from stand;sitting/lateral leans Pt Will Transfer to Toilet: with modified independence;ambulating Pt Will Perform Toileting - Clothing Manipulation and hygiene: with modified independence;sitting/lateral leans;sit to/from stand  Plan Discharge plan remains appropriate;Frequency remains appropriate    Co-evaluation                 AM-PAC OT "6 Clicks" Daily Activity     Outcome Measure   Help from another person eating meals?: None Help from another person taking care of personal grooming?: None Help from another person toileting, which includes using toliet, bedpan, or urinal?: A Little Help from another person bathing (including washing, rinsing, drying)?: A Little Help from another person to put on and taking off regular upper body  clothing?: A Little Help from another person to put on and taking off regular lower body clothing?: A Little 6 Click Score: 20    End of Session Equipment Utilized During Treatment: Rolling walker (2 wheels);Other (comment);Gait belt (knee scooter)  OT Visit Diagnosis: Unsteadiness on feet (R26.81);Muscle weakness (generalized) (M62.81);Pain Pain - Right/Left: Right Pain - part of body: Ankle and joints of foot   Activity Tolerance Patient limited by pain;Patient limited by fatigue   Patient Left in chair;with call bell/phone within reach;with family/visitor present   Nurse Communication Weight bearing status;Mobility status        Time: 6387-5643 OT Time Calculation (min): 22 min  Charges: OT General Charges $OT Visit: 1 Visit OT Treatments $Therapeutic Activity: 8-22 mins  Alfonzo Beers, OTD, OTR/L Acute Rehab [phone]) [phone]   Clinton [name] 04/21/2022, 10:18 AM

## 2022-04-21 NOTE — Telephone Encounter (Signed)
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 Still in hospital and wants to know if you will see him while he is still there.  He would of had his post op appt tomorrow if he had gone home. Looks like they were trying for CIR but denied today . Please advise.

## 2022-04-21 NOTE — Telephone Encounter (Signed)
Pt's wife [name] called. Pt had surgery last week and is hospitalized and pt's wife is unsure what to do. Pt has an upcoming appt that he will be unable to make so she cancelled appt.Please call pt's wife at [phone].

## 2022-04-21 NOTE — Progress Notes (Signed)
Inpatient Rehab Admissions Coordinator:    Spoke with Franco Collet from [facility] and patient has been denied for CIR admission due to lack of medical complexity. Discussed with patient and his wife. Team made aware. Signing off.   Rehab Admissons Coordinator [place], [place], [place] [phone]

## 2022-04-21 NOTE — Progress Notes (Signed)
[facility] [place]  Subjective  - no new complaints   Objective (!) 147/65 84 99 F (37.2 C) (Oral) 17 95%  Intake/Output Summary (Last 24 hours) at 04/21/2022 0736 Last data filed at 04/21/2022 0345 Gross per 24 hour  Intake 0 ml  Output 1150 ml  Net -1150 ml    Foot wound vac to suction/ NWB right foot  Doppler Peroneal and PT signal Left groin soft without hematoma Lungs non labored breathing  Assessment/Planning: Osteomyelitis, right fifth toe with PAD  66 y.o. 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 [name]   5 Days Post-Op   Right foot and leg dressing followed by Dr. [name].  Good inflow with PT/Peroneal doppler signals. Pending D/C home verse CIR   Clinton [name] 04/21/2022 7:36 AM --  Laboratory Lab Results: Recent Labs    04/19/22 0223  WBC 7.8  HGB 10.3*  HCT 30.7*  PLT 174   BMET Recent Labs    04/19/22 0223  NA 138  K 3.8  CL 104  CO2 26  GLUCOSE 112*  BUN 20  CREATININE 1.15  CALCIUM 8.8*    COAG Lab Results  Component Value Date   INR 1.00 08/27/2015   No results found for: "PTT"

## 2022-04-21 NOTE — Progress Notes (Signed)
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 PMH includes HTN and BLE ulcers.    PT Comments    Pt making good progress with knee scooter.  Session focused on endurance using knee scooter, as well as, balance and navigating around obstacles.  Pt did well with NWB but needs some cues for safety with transfers.  Pt hopeful for AIR placement at d/c.  Do continue to recommend further rehab at d/c due to pt NWB, requiring min guard-min A, has wound care/vac to manage, and wife does work 2 half days a week where pt could potentially be alone.     Recommendations for follow up therapy are one component of a multi-disciplinary discharge planning process, led by the attending physician.  Recommendations may be updated based on patient status, additional functional criteria and insurance authorization.  Follow Up Recommendations  Acute inpatient rehab (3hours/day)     Assistance Recommended at Discharge Frequent or constant Supervision/Assistance  Patient can return home with the following Assistance with cooking/housework;Help with stairs or ramp for entrance;Assist for transportation;A little help with walking and/or transfers;A little help with bathing/dressing/bathroom   Equipment Recommendations  None recommended by PT    Recommendations for Other Services Rehab consult;OT consult     Precautions / Restrictions Precautions Precautions: Fall Restrictions RLE Weight Bearing: Non weight bearing Other Position/Activity Restrictions: Order set says TDWB but per prior note Dr [name] stated NWB would be best and pt reports NWB     Mobility  Bed Mobility Overal bed mobility: Needs Assistance Bed Mobility: Supine to Sit, Sit to Supine     Supine to  sit: Supervision Sit to supine: Supervision        Transfers Overall transfer level: Needs assistance Equipment used:  (knee scooter) Transfers: Sit to/from Stand Sit to Stand: Min guard   Step pivot transfers: Min assist       General transfer comment: Pt performed sit to stand x 2 to knee scooter with heavy dependency on chair arm rest and bed rails.  When transitioning from knee scooter back to bed - pt leaned forward onto bedrail and chair armrest and took a couple hop pivots to transition back to sitting -min A to steady and cues for safety    Ambulation/Gait Ambulation/Gait assistance: Min assist Gait Distance (Feet): 150 Feet Assistive device:  (knee scooter) Gait Pattern/deviations: Step-to pattern Gait velocity: decreased     General Gait Details: Pt utilizing knee scooter on R side and pushing with L LE.  Required min A to steady.  Worked on distance ambulation in hallway, but also ambulated around obstacles in room that required pt to back and make short turns.  Requiring min cues and min A to steady.   Stairs             Wheelchair Mobility    Modified Rankin (Stroke Patients Only)       Balance Overall balance assessment: Needs assistance Sitting-balance support: No upper extremity supported, Feet supported Sitting balance-Leahy Scale: Good     Standing balance support: Bilateral upper extremity supported, During functional activity Standing balance-Leahy Scale: Poor Standing balance comment: Reliant on AD and needing min guard for safety  Cognition Arousal/Alertness: Awake/alert Behavior During Therapy: WFL for tasks assessed/performed Overall Cognitive Status: Within Functional Limits for tasks assessed                                          Exercises      General Comments General comments (skin integrity, edema, etc.): VSS on RA      Pertinent Vitals/Pain Pain  Assessment Pain Assessment: Faces Faces Pain Scale: Hurts little more Pain Location: RLE Pain Descriptors / Indicators: Discomfort Pain Intervention(s): Limited activity within patient's tolerance, Monitored during session, Repositioned    Home Living                          Prior Function            PT Goals (current goals can now be found in the care plan section) Progress towards PT goals: Progressing toward goals    Frequency    Min 3X/week      PT Plan Current plan remains appropriate    Co-evaluation              AM-PAC PT "6 Clicks" Mobility   Outcome Measure  Help needed turning from your back to your side while in a flat bed without using bedrails?: A Little Help needed moving from lying on your back to sitting on the side of a flat bed without using bedrails?: A Little Help needed moving to and from a bed to a chair (including a wheelchair)?: A Little Help needed standing up from a chair using your arms (e.g., wheelchair or bedside chair)?: A Little Help needed to walk in hospital room?: A Little Help needed climbing 3-5 steps with a railing? : Total 6 Click Score: 16    End of Session Equipment Utilized During Treatment: Gait belt Activity Tolerance: Patient tolerated treatment well Patient left: in bed;with call bell/phone within reach;with family/visitor present Nurse Communication: Mobility status PT Visit Diagnosis: Unsteadiness on feet (R26.81);Muscle weakness (generalized) (M62.81);Difficulty in walking, not elsewhere classified (R26.2)     Time: 1610-9604 PT Time Calculation (min) (ACUTE ONLY): 20 min  Charges:  $Gait Training: 8-22 mins                     Anise Salvo, PT Acute Rehab [facility] [phone]    Rayetta Humphrey 04/21/2022, 2:19 PM

## 2022-04-22 ENCOUNTER — Encounter: Payer: 59 | Admitting: Family

## 2022-04-22 MED ORDER — HYDROCODONE-ACETAMINOPHEN 7.5-325 MG PO TABS: 1.0000 | ORAL_TABLET | Freq: Four times a day (QID) | ORAL | 0 refills | Status: DC | PRN

## 2022-04-22 NOTE — Progress Notes (Signed)
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 PMH includes HTN and BLE ulcers.    PT Comments    Pt received OOB in recliner and agreeable to session with continued progress. Session focused on transfers and gait with RW for increased safety and balance. Pt continues to demonstrate mild impulsivity with transfers and gait needing increased cues and up to min assist to slow and for safety and stability. Pt with good adherence to NWB precautions throughout session and good tolerance for LE therex for increased strength and ROM. Educated pt re; activity recommendations, HEP and importance of continued mobility with pt and pt spouse verbalizing understanding. Discussed with supervising PT and updated PT recommendation to HHPT. Pt continues to benefit from skilled PT services to progress toward functional mobility goals.     Recommendations for follow up therapy are one component of a multi-disciplinary discharge planning process, led by the attending physician.  Recommendations may be updated based on patient status, additional functional criteria and insurance authorization.  Follow Up Recommendations  Home health PT     Assistance Recommended at Discharge Frequent or constant Supervision/Assistance  Patient can return home with the following Assistance with cooking/housework;Help with stairs or ramp for entrance;Assist for transportation;A little help with walking and/or transfers;A little help with bathing/dressing/bathroom   Equipment Recommendations  BSC/3in1    Recommendations for Other Services       Precautions / Restrictions Precautions Precautions: Fall Restrictions Weight Bearing Restrictions: Yes RLE Weight Bearing: Non weight  bearing Other Position/Activity Restrictions: Order set says TDWB but per prior note Dr [name] stated NWB would be best and pt reports NWB     Mobility  Bed Mobility Overal bed mobility: Needs Assistance             General bed mobility comments: pt OOB in recliner pre and post session    Transfers Overall transfer level: Needs assistance Equipment used: Rolling walker (2 wheels) Transfers: Sit to/from Stand Sit to Stand: Min assist, Min guard           General transfer comment: Pt performed sit to stand x7 throughout session to RW with heavy dependency on chair arm rest. asist to steady at start of session as pt moving quickly with decreased safety awarness, with cues pt able to slow and come to standing with min guard with increased stability x5 in sequence    Ambulation/Gait Ambulation/Gait assistance: Min guard Gait Distance (Feet): 50 Feet Assistive device: Rolling walker (2 wheels) Gait Pattern/deviations: Step-to pattern Gait velocity: decreased     General Gait Details: hop-to on LLE with RW and good stbaility, no LOB, light cues to slow   Stairs             Wheelchair Mobility    Modified Rankin (Stroke Patients Only)       Balance Overall balance assessment: Needs assistance Sitting-balance support: No upper extremity supported, Feet supported Sitting balance-Leahy Scale: Good Sitting balance - Comments: can reach outside BOS without LOB   Standing balance support: Bilateral upper extremity supported, During functional activity Standing balance-Leahy Scale: Poor Standing balance comment: Reliant on AD and needing min guard for safety  Cognition Arousal/Alertness: Awake/alert Behavior During Therapy: WFL for tasks assessed/performed Overall Cognitive Status: Within Functional Limits for tasks assessed Area of Impairment: Problem solving                             Problem Solving: Slow  processing          Exercises General Exercises - Lower Extremity Ankle Circles/Pumps: AROM, Right, 5 reps Quad Sets: AROM, Right, 5 reps Gluteal Sets: AROM, 5 reps Hip ABduction/ADduction: AROM, Right, 5 reps Straight Leg Raises: AROM, Right, 5 reps    General Comments General comments (skin integrity, edema, etc.): VSS on RA      Pertinent Vitals/Pain Pain Assessment Pain Assessment: Faces Faces Pain Scale: Hurts little more Pain Location: RLE whn in dependent position Pain Descriptors / Indicators: Discomfort Pain Intervention(s): Limited activity within patient's tolerance, Monitored during session    Home Living                          Prior Function            PT Goals (current goals can now be found in the care plan section) Acute Rehab PT Goals PT Goal Formulation: With patient/family Time For Goal Achievement: 05/01/22    Frequency    Min 3X/week      PT Plan Discharge plan needs to be updated    Co-evaluation              AM-PAC PT "6 Clicks" Mobility   Outcome Measure  Help needed turning from your back to your side while in a flat bed without using bedrails?: A Little Help needed moving from lying on your back to sitting on the side of a flat bed without using bedrails?: A Little Help needed moving to and from a bed to a chair (including a wheelchair)?: A Little Help needed standing up from a chair using your arms (e.g., wheelchair or bedside chair)?: A Little Help needed to walk in hospital room?: A Little Help needed climbing 3-5 steps with a railing? : Total 6 Click Score: 16    End of Session Equipment Utilized During Treatment: Gait belt Activity Tolerance: Patient tolerated treatment well Patient left: in chair;with call bell/phone within reach;with family/visitor present Nurse Communication: Mobility status PT Visit Diagnosis: Unsteadiness on feet (R26.81);Muscle weakness (generalized) (M62.81);Difficulty in walking,  not elsewhere classified (R26.2)     Time: 9417-4081 PT Time Calculation (min) (ACUTE ONLY): 15 min  Charges:  $Therapeutic Exercise: 8-22 mins                     Sharena Dibenedetto R. PTA Acute Rehabilitation Services Office: [phone]    Catalina Antigua 04/22/2022, 10:16 AM

## 2022-04-22 NOTE — Plan of Care (Signed)
Problem: Education: Goal: Understanding of CV disease, CV risk reduction, and recovery process will improve Outcome: Adequate for Discharge   Problem: Activity: Goal: Ability to return to baseline activity level will improve Outcome: Adequate for Discharge   Problem: Cardiovascular: Goal: Ability to achieve and maintain adequate cardiovascular perfusion will improve Outcome: Adequate for Discharge Goal: Vascular access site(s) Level 0-1 will be maintained Outcome: Adequate for Discharge   Problem: Health Behavior/Discharge Planning: Goal: Ability to safely manage health-related needs after discharge will improve Outcome: Adequate for Discharge   Problem: Education: Goal: Ability to describe self-care measures that may prevent or decrease complications (Diabetes Survival Skills Education) will improve Outcome: Adequate for Discharge   Problem: Coping: Goal: Ability to adjust to condition or change in health will improve Outcome: Adequate for Discharge   Problem: Fluid Volume: Goal: Ability to maintain a balanced intake and output will improve Outcome: Adequate for Discharge   Problem: Health Behavior/Discharge Planning: Goal: Ability to identify and utilize available resources and services will improve Outcome: Adequate for Discharge Goal: Ability to manage health-related needs will improve Outcome: Adequate for Discharge   Problem: Metabolic: Goal: Ability to maintain appropriate glucose levels will improve Outcome: Adequate for Discharge   Problem: Nutritional: Goal: Maintenance of adequate nutrition will improve Outcome: Adequate for Discharge Goal: Progress toward achieving an optimal weight will improve Outcome: Adequate for Discharge   Problem: Skin Integrity: Goal: Risk for impaired skin integrity will decrease Outcome: Adequate for Discharge   Problem: Tissue Perfusion: Goal: Adequacy of tissue perfusion will improve Outcome: Adequate for Discharge    Problem: Education: Goal: Knowledge of General Education information will improve Description: Including pain rating scale, medication(s)/side effects and non-pharmacologic comfort measures Outcome: Adequate for Discharge   Problem: Health Behavior/Discharge Planning: Goal: Ability to manage health-related needs will improve Outcome: Adequate for Discharge   Problem: Clinical Measurements: Goal: Ability to maintain clinical measurements within normal limits will improve Outcome: Adequate for Discharge Goal: Will remain free from infection Outcome: Adequate for Discharge Goal: Diagnostic test results will improve Outcome: Adequate for Discharge Goal: Respiratory complications will improve Outcome: Adequate for Discharge Goal: Cardiovascular complication will be avoided Outcome: Adequate for Discharge   Problem: Activity: Goal: Risk for activity intolerance will decrease Outcome: Adequate for Discharge   Problem: Nutrition: Goal: Adequate nutrition will be maintained Outcome: Adequate for Discharge   Problem: Coping: Goal: Level of anxiety will decrease Outcome: Adequate for Discharge   Problem: Elimination: Goal: Will not experience complications related to bowel motility Outcome: Adequate for Discharge Goal: Will not experience complications related to urinary retention Outcome: Adequate for Discharge   Problem: Pain Managment: Goal: General experience of comfort will improve Outcome: Adequate for Discharge   Problem: Safety: Goal: Ability to remain free from injury will improve Outcome: Adequate for Discharge   Problem: Skin Integrity: Goal: Risk for impaired skin integrity will decrease Outcome: Adequate for Discharge   Problem: Education: Goal: Knowledge of the prescribed therapeutic regimen will improve Outcome: Adequate for Discharge Goal: Ability to verbalize activity precautions or restrictions will improve Outcome: Adequate for Discharge Goal:  Understanding of discharge needs will improve Outcome: Adequate for Discharge   Problem: Activity: Goal: Ability to perform//tolerate increased activity and mobilize with assistive devices will improve Outcome: Adequate for Discharge   Problem: Clinical Measurements: Goal: Postoperative complications will be avoided or minimized Outcome: Adequate for Discharge   Problem: Self-Care: Goal: Ability to meet self-care needs will improve Outcome: Adequate for Discharge   Problem: Self-Concept: Goal: Ability to  maintain and perform role responsibilities to the fullest extent possible will improve Outcome: Adequate for Discharge   Problem: Pain Management: Goal: Pain level will decrease with appropriate interventions Outcome: Adequate for Discharge   Problem: Skin Integrity: Goal: Demonstration of wound healing without infection will improve Outcome: Adequate for Discharge   Problem: Acute Rehab PT Goals(only PT should resolve) Goal: Pt Will Go Supine/Side To Sit Outcome: Adequate for Discharge Goal: Pt Will Go Sit To Supine/Side Outcome: Adequate for Discharge Goal: Patient Will Transfer Sit To/From Stand Outcome: Adequate for Discharge Goal: Pt Will Transfer Bed To Chair/Chair To Bed Outcome: Adequate for Discharge Goal: Pt Will Ambulate Outcome: Adequate for Discharge   Problem: Acute Rehab OT Goals (only OT should resolve) Goal: Pt. Will Perform Lower Body Bathing Outcome: Adequate for Discharge Goal: Pt. Will Perform Lower Body Dressing Outcome: Adequate for Discharge Goal: Pt. Will Transfer To Toilet Outcome: Adequate for Discharge Goal: Pt. Will Perform Toileting-Clothing Manipulation Outcome: Adequate for Discharge

## 2022-04-22 NOTE — Progress Notes (Signed)
  Progress Note    04/22/2022 7:38 AM 6 Days Post-Op  Subjective:  Ready for discharge home   Vitals:   04/21/22 2315 04/22/22 0350  BP: 126/61 (!) 142/69  Pulse: 68 68  Resp: 15 15  Temp: 99.1 F (37.3 C) 98.5 F (36.9 C)  SpO2: 98% 99%   Physical Exam: Lungs:  non labored Incisions:  L groin without hematoma Extremities:  R foot with good seal Neurologic: A&O  CBC    Component Value Date/Time   WBC 7.8 04/19/2022 0223   RBC 3.14 (L) 04/19/2022 0223   HGB 10.3 (L) 04/19/2022 0223   HCT 30.7 (L) 04/19/2022 0223   PLT 174 04/19/2022 0223   MCV 97.8 04/19/2022 0223   MCH 32.8 04/19/2022 0223   MCHC 33.6 04/19/2022 0223   RDW 11.9 04/19/2022 0223   LYMPHSABS 1,333 04/08/2022 1621   EOSABS 60 04/08/2022 1621   BASOSABS 47 04/08/2022 1621    BMET    Component Value Date/Time   NA 138 04/19/2022 0223   K 3.8 04/19/2022 0223   CL 104 04/19/2022 0223   CO2 26 04/19/2022 0223   GLUCOSE 112 (H) 04/19/2022 0223   BUN 20 04/19/2022 0223   CREATININE 1.15 04/19/2022 0223   CREATININE 1.24 04/08/2022 1621   CALCIUM 8.8 (L) 04/19/2022 0223   GFRNONAA >60 04/19/2022 0223   GFRAA >60 09/06/2015 0445    INR    Component Value Date/Time   INR 1.00 08/27/2015 1331     Intake/Output Summary (Last 24 hours) at 04/22/2022 0738 Last data filed at 04/22/2022 0500 Gross per 24 hour  Intake --  Output 1550 ml  Net -1550 ml     Assessment/Plan:  66 y.o. male is s/p Osteomyelitis, right fifth toe with PAD  66 y.o. 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 [name] 6 Days Post-Op   R foot wound vac will be changed in office with Dr. [name] on Thursday VVS will arrange RLE arterial duplex and ABI in 4-6 weeks Continue aspirin, plavix, statin at discharge He will be discharged home today pending [facility] PT approval with [facility]   Emilie Rutter, PA-C [facility] [phone] 04/22/2022 7:38 AM

## 2022-04-22 NOTE — Progress Notes (Signed)
Patient ID: Clinton [name], male   DOB: [birth year], 65 y.o.   MRN: [id number] Patient is postoperative day 5, fifth ray amputation right foot.  Patient still has dark bloody drainage.  Patient has 2 days left on the wound VAC.  Anticipate discharge to home today and I will follow-up in the office on Thursday to change the dressing.

## 2022-04-22 NOTE — TOC Transition Note (Signed)
Transition of Care (TOC) - CM/SW Discharge Note Donn Pierini RN, BSN Transitions of Care Unit 4E- RN Case Manager See Treatment Team for direct phone #    Patient Details  Name: Clinton [name] MRN: [id number] Date of Birth: 03-15-1956  Transition of Care [facility] CM/SW Contact:  Darrold Span, RN Phone Number: 04/22/2022, 10:45 AM   Clinical Narrative:    Pt stable for transition home. Insurance denied INPT rehab stay, pt now electing to return home w/ HH. Orders have been placed for HHPT/OT.  CM spoke with pt and wife at bedside. Choice offered and pt/wife voiced that they want to use [facility] for [facility] needs.  Pt has DME at home, requesting [facility]- explained insurance may not cover or they may have copay- voiced understanding and are agreeable with any cost. Will use in house provider- [employer]. Order placed.   Wife to transport home. Pt already on Prevena VAC- per Dr. [name] will stay in place for 2 more days.   Address, phone # and PCP all confirmed with pt in epic.   Call made to [employer] for DME- BSC to be delivered to room prior to pt discharging.   Call made to [facility] for [facility] referral- referral has been accepted and Frances Furbish will call within 48 hr post discharge to schedule start of care visit.   RNCM will sign off for now as intervention is no longer needed. Please re-consult  if new needs arise, or contact RNCM assigned to treatment team for further questions/concerns.      Final next level of care: Home w Home Health Services Barriers to Discharge: Barriers Resolved   Patient Goals and CMS Choice Patient states their goals for this hospitalization and ongoing recovery are:: return home CMS Medicare.gov Compare Post Acute Care list provided to:: Patient Choice offered to / list presented to : Patient, Spouse  Discharge Placement               Home w/ [facility]        Discharge Plan and Services   Discharge Planning Services: CM Consult Post Acute Care Choice:  Durable Medical Equipment, Home Health          DME Arranged: Bedside commode DME Agency: [employer] Date DME Agency Contacted: 04/22/22 Time DME Agency Contacted: 1044 Representative spoke with at DME Agency: Arnold Long HH Arranged: PT, OT HH Agency: [facility] Date [facility] Agency Contacted: 04/22/22 Time HH Agency Contacted: 1045 Representative spoke with at [facility] Agency: Kandee Keen  Social Determinants of Health (SDOH) Interventions     Readmission Risk Interventions    04/22/2022   10:45 AM  Readmission Risk Prevention Plan  Post Dischage Appt Complete  Medication Screening Complete  Transportation Screening Complete

## 2022-04-22 NOTE — Progress Notes (Signed)
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 PMH includes HTN and BLE ulcers.   OT comments  Pt progressing towards goals, able to complete UB dressing with set up A, LB dressing with min A. Pt needing supervision for bed mobility and transfers with RW. Pt adhering well to NWB status on RLE. Educated pt on use of BSC over toilet at home. Pt presenting with impairments listed below, will follow acutely. Updating d/c recommendation to HHOT.   Recommendations for follow up therapy are one component of a multi-disciplinary discharge planning process, led by the attending physician.  Recommendations may be updated based on patient status, additional functional criteria and insurance authorization.    Follow Up Recommendations  Home health OT    Assistance Recommended at Discharge Intermittent Supervision/Assistance  Patient can return home with the following  A little help with walking and/or transfers;A little help with bathing/dressing/bathroom;Assistance with cooking/housework;Assist for transportation   Equipment Recommendations  BSC/3in1;Other (comment) (RW)    Recommendations for Other Services      Precautions / Restrictions Precautions Precautions: Fall Restrictions Weight Bearing Restrictions: Yes RLE Weight Bearing: Non weight bearing Other Position/Activity Restrictions: Order set says TDWB but per prior note Dr [name] stated NWB would be best and pt reports NWB       Mobility Bed Mobility Overal bed mobility: Needs Assistance Bed Mobility: Supine to Sit     Supine to sit: Supervision          Transfers Overall transfer level: Needs assistance Equipment used: Rolling walker (2 wheels) Transfers: Sit to/from Stand Sit  to Stand: Supervision                 Balance Overall balance assessment: Needs assistance Sitting-balance support: No upper extremity supported, Feet supported Sitting balance-Leahy Scale: Good Sitting balance - Comments: can reach outside BOS without LOB   Standing balance support: Bilateral upper extremity supported, During functional activity Standing balance-Leahy Scale: Poor Standing balance comment: Reliant on AD and needing min guard for safety                           ADL either performed or assessed with clinical judgement   ADL Overall ADL's : Needs assistance/impaired                 Upper Body Dressing : Set up;Sitting   Lower Body Dressing: Minimal assistance;Sit to/from stand;Sitting/lateral leans   Toilet Transfer: Supervision/safety;Rolling walker (2 wheels);Ambulation;Regular Toilet   Toileting- Architect and Hygiene: Supervision/safety       Functional mobility during ADLs: Supervision/safety;Rolling walker (2 wheels)      Extremity/Trunk Assessment Upper Extremity Assessment Upper Extremity Assessment: Overall WFL for tasks assessed   Lower Extremity Assessment Lower Extremity Assessment: Defer to PT evaluation        Vision   Vision Assessment?: No apparent visual deficits   Perception Perception Perception: Not tested   Praxis Praxis Praxis: Not tested    Cognition Arousal/Alertness: Awake/alert Behavior During Therapy: WFL for tasks assessed/performed Overall Cognitive Status: Within Functional Limits for tasks assessed Area of Impairment: Problem solving  Problem Solving: Slow processing          Exercises      Shoulder Instructions       General Comments VSS on RA    Pertinent Vitals/ Pain       Pain Assessment Pain Assessment: Faces Pain Score: 4  Faces Pain Scale: Hurts little more Pain Location: RLE with dangling at EOB Pain Descriptors /  Indicators: Discomfort Pain Intervention(s): Limited activity within patient's tolerance, Monitored during session  Home Living                                          Prior Functioning/Environment              Frequency  Min 2X/week        Progress Toward Goals  OT Goals(current goals can now be found in the care plan section)  Progress towards OT goals: Progressing toward goals  Acute Rehab OT Goals Patient Stated Goal: to go home OT Goal Formulation: With patient/family Time For Goal Achievement: 05/02/22 Potential to Achieve Goals: Good ADL Goals Pt Will Perform Lower Body Bathing: with modified independence;sit to/from stand;sitting/lateral leans Pt Will Perform Lower Body Dressing: with modified independence;sit to/from stand;sitting/lateral leans Pt Will Transfer to Toilet: with modified independence;ambulating Pt Will Perform Toileting - Clothing Manipulation and hygiene: with modified independence;sitting/lateral leans;sit to/from stand  Plan Discharge plan needs to be updated;Frequency remains appropriate    Co-evaluation                 AM-PAC OT "6 Clicks" Daily Activity     Outcome Measure   Help from another person eating meals?: None Help from another person taking care of personal grooming?: None Help from another person toileting, which includes using toliet, bedpan, or urinal?: A Little Help from another person bathing (including washing, rinsing, drying)?: A Little Help from another person to put on and taking off regular upper body clothing?: A Little Help from another person to put on and taking off regular lower body clothing?: A Little 6 Click Score: 20    End of Session Equipment Utilized During Treatment: Gait belt;Rolling walker (2 wheels)  OT Visit Diagnosis: Unsteadiness on feet (R26.81);Muscle weakness (generalized) (M62.81);Pain Pain - Right/Left: Right Pain - part of body: Ankle and joints of foot    Activity Tolerance Patient tolerated treatment well   Patient Left in chair;with call bell/phone within reach;with family/visitor present;with nursing/sitter in room   Nurse Communication Weight bearing status;Mobility status        Time: 0814-4818 OT Time Calculation (min): 25 min  Charges: OT General Charges $OT Visit: 1 Visit OT Treatments $Self Care/Home Management : 23-37 mins  Alfonzo Beers, OTD, OTR/L Acute Rehab [phone]   Mayer Masker 04/22/2022, 8:30 AM

## 2022-04-24 ENCOUNTER — Telehealth: Payer: Self-pay | Admitting: Orthopedic Surgery

## 2022-04-24 ENCOUNTER — Encounter: Payer: Self-pay | Admitting: Orthopedic Surgery

## 2022-04-24 ENCOUNTER — Ambulatory Visit (INDEPENDENT_AMBULATORY_CARE_PROVIDER_SITE_OTHER): Payer: 59 | Admitting: Orthopedic Surgery

## 2022-04-24 DIAGNOSIS — I739 Peripheral vascular disease, unspecified: Secondary | ICD-10-CM

## 2022-04-24 DIAGNOSIS — M86271 Subacute osteomyelitis, right ankle and foot: Secondary | ICD-10-CM

## 2022-04-24 DIAGNOSIS — T8131XA Disruption of external operation (surgical) wound, not elsewhere classified, initial encounter: Secondary | ICD-10-CM

## 2022-04-24 MED ORDER — NITROGLYCERIN 0.2 MG/HR TD PT24: 0.2000 mg | MEDICATED_PATCH | Freq: Every day | TRANSDERMAL | 12 refills | Status: DC

## 2022-04-24 NOTE — Telephone Encounter (Signed)
Patient's wife [name] called asked if patient need to be taking an antibiotic again?  The number to contact [place] [phone]

## 2022-04-24 NOTE — Telephone Encounter (Signed)
Pt was in the office today right 5th ray amp 04/16/2022. Pt's wife wanting to know if he needs to be on ABX. Please advise.

## 2022-04-24 NOTE — Discharge Summary (Signed)
Discharge Summary  Patient ID: Clinton [name] [id number] 65 y.o. [birth year]  Admit date: 04/15/2022  Discharge date and time: 04/22/2022 11:51 AM   Admitting Physician: Nada Libman, MD   Discharge Physician: same  Admission Diagnoses: PAD (peripheral artery disease) [facility]) [I73.9]  Discharge Diagnoses: same  Admission Condition: fair  Discharged Condition: fair  Indication for Admission: post operative care  Hospital Course: Clinton [name] is a 66 year old male who was brought in as an outpatient and underwent right popliteal and anterior tibial artery angioplasty by [name] on 04/15/2022.  This was performed due to fifth toe ulceration.  He was admitted to the hospital and Dr. [name] was consulted.  The following day he was brought to the operating room by Dr. [name] and underwent right fifth toe ray amputation with placement of fish skin and wound VAC.  Hospital stay was prolonged due to therapy recommendations for CIR.  Eventually he was denied by CIR. wound VAC was changed by Dr. Audrie Lia team inpatient and will be changed again in the outpatient setting.  TOC arranged home health PT and OT.  He will follow-up in 1 month for right lower extremity arterial duplex with ABIs.  He was prescribed narcotic pain medication for continued postoperative pain control.  He was discharged home in stable condition.  Consults:  orthopedics  Treatments: surgery: Aortogram with right popliteal and ATA angioplasty by [name] on 04/15/2022 Right fifth toe ray amputation by Dr. [name] on 04/16/2022  Discharge Exam: See progress note 04/22/22 Vitals:   04/22/22 0822 04/22/22 0839  BP: 123/63 (!) 127/59  Pulse: 60 72  Resp: 14 17  Temp: 98.3 F (36.8 C) 98.2 F (36.8 C)  SpO2: 99% 99%     Disposition: Discharge disposition: 01-Home or Self Care       Patient Instructions:  Allergies as of 04/22/2022       Reactions   Penicillins Rash   Has patient had a PCN reaction causing  immediate rash, facial/tongue/throat swelling, SOB or lightheadedness with hypotension: Yes Has patient had a PCN reaction causing severe rash involving mucus membranes or skin necrosis: No Has patient had a PCN reaction that required hospitalization No Has patient had a PCN reaction occurring within the last 10 years: No If all of the above answers are "NO", then may proceed with Cephalosporin use.   Sulfa Antibiotics Swelling, Other (See Comments)   Facial/lip swelling        Medication List     TAKE these medications    acetaminophen 500 MG tablet Commonly known as: TYLENOL Take 1,000 mg by mouth every 6 (six) hours as needed for moderate pain or mild pain.   amLODipine 2.5 MG tablet Commonly known as: NORVASC Take 2.5 mg by mouth at bedtime.   aspirin EC 81 MG tablet Take 81 mg by mouth at bedtime.   atorvastatin 40 MG tablet Commonly known as: LIPITOR Take 40 mg by mouth at bedtime.   clopidogrel 75 MG tablet Commonly known as: Plavix Take 1 tablet (75 mg total) by mouth daily. What changed: when to take this   HYDROcodone-acetaminophen 7.5-325 MG tablet Commonly known as: NORCO Take 1-2 tablets by mouth every 6 (six) hours as needed for severe pain (pain score 7-10).   irbesartan-hydrochlorothiazide 300-12.5 MG tablet Commonly known as: AVALIDE Take 1 tablet by mouth at bedtime.   multivitamin capsule Take 2 capsules by mouth at bedtime. Gummy   sildenafil 100 MG tablet Commonly known as: VIAGRA Take 100  mg by mouth as needed for erectile dysfunction.       Activity: activity as tolerated Diet: regular diet Wound Care: as directed  Follow-up with [facility] in 4 weeks.  Signed: Emilie Rutter, PA-C 04/24/2022 2:34 PM [facility] Office: [phone]

## 2022-04-24 NOTE — Progress Notes (Signed)
Office Visit Note   Patient: Clinton [name]           Date of Birth: 09/28/1955           MRN: [id number] Visit Date: 04/24/2022              Requested by: Darrow Bussing, MD [address] [place],  [place] [address] PCP: Darrow Bussing, MD  Chief Complaint  Patient presents with   Right Foot - Routine Post Op    04/16/2022 right 5th ray amputation      Endovascular revascularization with a popliteal stent.  Patient's most recent ABIs on 04/10/2022 shows monophasic flow dominant anterior tibial flow. HPI: Patient is a 66 year old gentleman who presents in follow-up status post right foot fifth ray amputation he is 1 week out from surgery he is also status post  Assessment & Plan: Visit Diagnoses:  1. Peripheral arterial disease (HCC)   2. Subacute osteomyelitis, right ankle and foot (HCC)   3. Postoperative wound dehiscence, initial encounter     Plan: Patient is showing ischemic wound breakdown.  Will start Dial soap cleansing nitroglycerin patch over the dorsalis pedis artery change daily he is currently on Plavix.  Follow-up in 1 week.  Follow-Up Instructions: Return in about 1 week (around 05/01/2022).   Ortho Exam  Patient is alert, oriented, no adenopathy, well-dressed, normal affect, normal respiratory effort. Examination patient has ischemic changes to the incision there is no purulent drainage no cellulitis no signs of infection.  Patient does not have a palpable dorsalis pedis pulse the Doppler was used and he has a dampened monophasic dorsalis pedis pulse which seems consistent with his most recent ABI on 04/10/2022.  Imaging: No results found. No images are attached to the encounter.  Labs: Lab Results  Component Value Date   HGBA1C 5.4 04/15/2022   ESRSEDRATE 14 04/08/2022   CRP 2.1 04/08/2022     Lab Results  Component Value Date   ALBUMIN 3.9 08/27/2015    No results found for: "MG" No results found for: "VD25OH"  No results  found for: "PREALBUMIN"    Latest Ref Rng & Units 04/19/2022    2:23 AM 04/18/2022    6:12 AM 04/15/2022    8:37 AM  CBC EXTENDED  WBC 4.0 - 10.5 K/uL 7.8  8.8    RBC 4.22 - 5.81 MIL/uL 3.14  3.31    Hemoglobin 13.0 - 17.0 g/dL 75.6  43.3  29.5   HCT 39.0 - 52.0 % 30.7  33.2  38.0   Platelets 150 - 400 K/uL 174  197       There is no height or weight on file to calculate BMI.  Orders:  No orders of the defined types were placed in this encounter.  No orders of the defined types were placed in this encounter.    Procedures: No procedures performed  Clinical Data: No additional findings.  ROS:  All other systems negative, except as noted in the HPI. Review of Systems  Objective: Vital Signs: There were no vitals taken for this visit.  Specialty Comments:  No specialty comments available.  PMFS History: Patient Active Problem List   Diagnosis Date Noted   PAD (peripheral artery disease) (HCC) 04/17/2022   Acute osteomyelitis of metatarsal bone of right foot (HCC)    Atherosclerosis of native arteries of the extremities with ulceration (HCC) 04/15/2022   Peripheral arterial disease (HCC) 03/25/2022   Obesity 03/21/2022   Prediabetes 03/21/2022  Erectile dysfunction 07/26/2021   Family history of ischemic heart disease (IHD) 07/26/2021   Pure hypercholesterolemia 07/26/2021   History of adenomatous polyp of colon 02/03/2019   Internal hemorrhoids 02/03/2019   Carotid artery stenosis, asymptomatic 09/05/2015   Cellulitis    Hypertension    Hyperlipidemia    Occlusion and stenosis of carotid artery without mention of cerebral infarction 01/01/2012   Past Medical History:  Diagnosis Date   Carotid artery occlusion    Cellulitis    Erectile dysfunction    Hyperlipidemia    Hypertension     Family History  Problem Relation Age of Onset   Other Mother        circulatory problems   Heart attack Mother    Deep vein thrombosis Mother    Heart disease Mother     Hyperlipidemia Father    Hypertension Father    Diabetes Father    Deep vein thrombosis Father    Heart disease Father        before age 26   Heart attack Father    Peripheral vascular disease Father    Hypertension Brother    Hyperlipidemia Brother     Past Surgical History:  Procedure Laterality Date   ABDOMINAL AORTOGRAM W/LOWER EXTREMITY N/A 02/04/2022   Procedure: ABDOMINAL AORTOGRAM W/LOWER EXTREMITY;  Surgeon: Nada Libman, MD;  Location: [facility];  Service: Cardiovascular;  Laterality: N/A;   ABDOMINAL AORTOGRAM W/LOWER EXTREMITY N/A 02/18/2022   Procedure: ABDOMINAL AORTOGRAM W/LOWER EXTREMITY;  Surgeon: Nada Libman, MD;  Location: [facility];  Service: Cardiovascular;  Laterality: N/A;   ABDOMINAL AORTOGRAM W/LOWER EXTREMITY N/A 04/15/2022   Procedure: ABDOMINAL AORTOGRAM W/LOWER EXTREMITY;  Surgeon: Nada Libman, MD;  Location: [facility];  Service: Cardiovascular;  Laterality: N/A;   AMPUTATION Right 04/16/2022   Procedure: RIGHT 5TH RAY AMPUTATION;  Surgeon: Nadara Mustard, MD;  Location: [facility] OR;  Service: Orthopedics;  Laterality: Right;   COLONOSCOPY     ENDARTERECTOMY Right 09/05/2015   Procedure: ENDARTERECTOMY CAROTID RIGHT;  Surgeon: Sherren Kerns, MD;  Location[facility];  Service: Vascular;  Laterality: Right;   PATCH ANGIOPLASTY  09/05/2015   Procedure: PATCH ANGIOPLASTY RIGHT CAROTID ARTERY USING HEMASHIELD PLATINUM FINESSE PATCH;  Surgeon: Sherren Kerns, MD;  Location: [facility] OR;  Service: Vascular;;   PERIPHERAL VASCULAR ATHERECTOMY Left 02/18/2022   Procedure: PERIPHERAL VASCULAR ATHERECTOMY;  Surgeon: Nada Libman, MD;  Location: [facility];  Service: Cardiovascular;  Laterality: Left;  Popliteal and Peroneal   PERIPHERAL VASCULAR BALLOON ANGIOPLASTY  02/04/2022   Procedure: PERIPHERAL VASCULAR BALLOON ANGIOPLASTY;  Surgeon: Nada Libman, MD;  Location: [facility];  Service: Cardiovascular;;   PERIPHERAL  VASCULAR BALLOON ANGIOPLASTY Right 04/15/2022   Procedure: PERIPHERAL VASCULAR BALLOON ANGIOPLASTY;  Surgeon: Nada Libman, MD;  Location: [facility];  Service: Cardiovascular;  Laterality: Right;  SFA/POPLITEAL   TONSILLECTOMY     VASECTOMY     Social History   Occupational History   Not on file  Tobacco Use   Smoking status: Never   Smokeless tobacco: Former    Types: Chew    Quit date: 01/01/2015  Vaping Use   Vaping Use: Never used  Substance and Sexual Activity   Alcohol use: Yes    Alcohol/week: 7.0 standard drinks of alcohol    Types: 7 Cans of beer per week    Comment: pt states that he use to have a beer  occasionly but has not had one in about one month   Drug use: No   Sexual activity: Not on file

## 2022-04-25 ENCOUNTER — Other Ambulatory Visit: Payer: Self-pay

## 2022-04-25 ENCOUNTER — Telehealth: Payer: Self-pay

## 2022-04-25 DIAGNOSIS — I7025 Atherosclerosis of native arteries of other extremities with ulceration: Secondary | ICD-10-CM

## 2022-04-25 MED ORDER — DOXYCYCLINE HYCLATE 100 MG PO CAPS: 100.0000 mg | ORAL_CAPSULE | Freq: Two times a day (BID) | ORAL | 0 refills | Status: DC

## 2022-04-25 NOTE — Telephone Encounter (Signed)
Pt called stating that he had just been to Dr Audrie Lia office for a f/u and his amputation is not healing well. He is going to try using Nitro patches due to concern about decreased blood flow. Pt is requesting an appt with Dr Myra Gianotti.  Reviewed pt's chart, spoke with [facility], [place] who recommended that he be seen on Monday. Called pt, appts for [place] and [place] scheduled. Confirmed understanding. Pt seemed in low spirits. Tried to give pt some encouragement.

## 2022-04-25 NOTE — Telephone Encounter (Signed)
Rx sent to pharmacy. Called and sw pt's wife to advise of message below. Voiced understanding and will call with any questions.

## 2022-04-28 ENCOUNTER — Ambulatory Visit (HOSPITAL_COMMUNITY)
Admission: RE | Admit: 2022-04-28 | Discharge: 2022-04-28 | Disposition: A | Discharge status: Home / Self Care | Payer: 59 | Source: Ambulatory Visit | Attending: Surgery | Admitting: Surgery

## 2022-04-28 ENCOUNTER — Ambulatory Visit (INDEPENDENT_AMBULATORY_CARE_PROVIDER_SITE_OTHER): Payer: 59 | Admitting: Surgery

## 2022-04-28 ENCOUNTER — Encounter: Payer: Self-pay | Admitting: Surgery

## 2022-04-28 VITALS — BP 127/69 | HR 83 | Temp 98.2°F | Resp 20

## 2022-04-28 DIAGNOSIS — I7025 Atherosclerosis of native arteries of other extremities with ulceration: Secondary | ICD-10-CM | POA: Diagnosis not present

## 2022-04-28 NOTE — Progress Notes (Signed)
Vascular and Vein Specialist of [facility]  Patient name: Clinton [name] MRN: [id number] DOB: 1956-02-01 Sex: male   REASON FOR VISIT:    Follow up  HISOTRY OF PRESENT ILLNESS:   Clinton [name] is a 66 y.o. male with a history of bilateral lower extremity ulcers since the beginning of the summer.  On 02/04/2022 he underwent angiography.  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  He had diffuse disease out onto the foot.  On 02/18/2022 he had repeat angiography this time of the left leg.  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  These were treated with drug-coated balloon angioplasty.  There was a residual stenosis in the distal popliteal artery and origin of the tibioperoneal trunk which was treated with CSI atherectomy.  He again had diffuse disease out onto the foot.  The last time I saw him, he was scheduled to have a right fifth toe amputation secondary to osteomyelitis by Dr. [name].  Because of the diffuse disease he had in his leg, I felt that repeating his angiogram before toe amputation will be advisable to maximize his chance of healing.  Therefore on 04/15/2022 he underwent angiography.  He had recurrent stenosis in the right popliteal artery behind the joint space, as well as within the anterior tibial artery.  These were both treated using a 3 mm balloon.  From a vascular perspective, he has been optimized.  He went on to have a right fifth toe amputation on 04/16/2022 by Dr. [name].  He did not have great bleeding at the time of his amputation.    The patient underwent right-sided carotid endarterectomy on 09/05/2015 for asymptomatic stenosis by [name].  His last ultrasound showed no recurrence with no significant bilateral stenosis.  He is  also undergone evaluation for a aneurysm.  His ultrasound was negative for aortic enlargement.  He takes a statin for hypercholesterolemia.  He is medically managed for hypertension.     PAST MEDICAL HISTORY:   Past Medical History:  Diagnosis Date   Carotid artery occlusion    Cellulitis    Erectile dysfunction    Hyperlipidemia    Hypertension      FAMILY HISTORY:   Family History  Problem Relation Age of Onset   Other Mother        circulatory problems   Heart attack Mother    Deep vein thrombosis Mother    Heart disease Mother    Hyperlipidemia Father    Hypertension Father    Diabetes Father    Deep vein thrombosis Father    Heart disease Father        before age 53   Heart attack Father    Peripheral vascular disease Father    Hypertension Brother    Hyperlipidemia Brother     SOCIAL HISTORY:   Social History   Tobacco Use   Smoking status: Never   Smokeless tobacco: Former    Types: Chew    Quit date: 01/01/2015  Substance Use Topics   Alcohol use: Yes    Alcohol/week: 7.0 standard drinks of alcohol    Types: 7 Cans of beer per week    Comment: pt states that he use to have a beer occasionly but has not had one in about one month     ALLERGIES:  Allergies  Allergen Reactions   Penicillins Rash    Has patient had a PCN reaction causing immediate rash, facial/tongue/throat swelling, SOB or lightheadedness with hypotension: Yes Has patient had a PCN reaction causing severe rash involving mucus membranes or skin necrosis: No Has patient had a PCN reaction that required hospitalization No Has patient had a PCN reaction occurring within the last 10 years: No If all of the above answers are "NO", then may proceed with Cephalosporin use.    Sulfa Antibiotics Swelling and Other (See Comments)    Facial/lip swelling     CURRENT MEDICATIONS:   Current Outpatient Medications  Medication Sig Dispense Refill   acetaminophen (TYLENOL) 500 MG tablet  Take 1,000 mg by mouth every 6 (six) hours as needed for moderate pain or mild pain.     amLODipine (NORVASC) 2.5 MG tablet Take 2.5 mg by mouth at bedtime.     aspirin EC 81 MG tablet Take 81 mg by mouth at bedtime.     atorvastatin (LIPITOR) 40 MG tablet Take 40 mg by mouth at bedtime.     clopidogrel (PLAVIX) 75 MG tablet Take 1 tablet (75 mg total) by mouth daily. (Patient taking differently: Take 75 mg by mouth at bedtime.) 30 tablet 11   doxycycline (VIBRAMYCIN) 100 MG capsule Take 1 capsule (100 mg total) by mouth 2 (two) times daily. 28 capsule 0   HYDROcodone-acetaminophen (NORCO) 7.5-325 MG tablet Take 1-2 tablets by mouth every 6 (six) hours as needed for severe pain (pain score 7-10). 20 tablet 0   irbesartan-hydrochlorothiazide (AVALIDE) 300-12.5 MG tablet Take 1 tablet by mouth at bedtime.     Multiple Vitamin (MULTIVITAMIN) capsule Take 2 capsules by mouth at bedtime. Gummy     nitroGLYCERIN (NITRODUR - DOSED IN MG/24 HR) 0.2 mg/hr patch Place 1 patch (0.2 mg total) onto the skin daily. 30 patch 12   sildenafil (VIAGRA) 100 MG tablet Take 100 mg by mouth as needed for erectile dysfunction.     No current facility-administered medications for this visit.    REVIEW OF SYSTEMS:   [X]  denotes positive finding, [ ]  denotes negative finding Cardiac  Comments:  Chest pain or chest pressure:    Shortness of breath upon exertion:    Short of breath when lying flat:    Irregular heart rhythm:        Vascular    Pain in calf, thigh, or hip brought on by ambulation:    Pain in feet at night that wakes you up from your sleep:     Blood clot in your veins:    Leg swelling:         Pulmonary    Oxygen at home:    Productive cough:     Wheezing:         Neurologic    Sudden weakness in arms or legs:     Sudden numbness in arms or legs:     Sudden onset of difficulty speaking or slurred speech:    Temporary loss of vision in one eye:     Problems with dizziness:          Gastrointestinal    Blood in stool:     Vomited blood:         Genitourinary    Burning when urinating:     Blood in urine:        Psychiatric    Major depression:         Hematologic  Bleeding problems:    Problems with blood clotting too easily:        Skin    Rashes or ulcers:        Constitutional    Fever or chills:      PHYSICAL EXAM:   Vitals:   04/28/22 1152  BP: 127/69  Pulse: 83  Resp: 20  Temp: 98.2 F (36.8 C)  SpO2: 97%    GENERAL: The patient is a well-nourished male, in no acute distress. The vital signs are documented above. CARDIAC: There is a regular rate and rhythm.  VASCULAR: Nonpalpable pedal pulses PULMONARY: Non-labored respirations MUSCULOSKELETAL: There are no major deformities or cyanosis. NEUROLOGIC: No focal weakness or paresthesias are detected. SKIN: See photo below PSYCHIATRIC: The patient has a normal affect.   STUDIES:   I have reviewed his vascular studies with the following findings: Right: 50-74% stenosis noted in the peroneal artery. Right PTA appears  occluded distally. Right Distal Perotoneal artery waveform is blunted and  monophasic.   Left: All visualized Left lower extremity arteries are patent with  multiphasic waveforms.   MEDICAL ISSUES:   I discussed with the patient and his wife that he is optimized from a vascular perspective.  Unfortunately, he is having ischemic breakdown of his amputation site.  I discussed the possibility of a transtibial amputation to try and prepare him mentally for this.  We also discussed the possibility of wound debridement and even hyperbarics to try and salvage his foot.  He is scheduled to see Dr. [name] later this week.    Charlena Cross, MD, FACS Vascular and Vein Specialists of [facility] [phone] Pager [phone]

## 2022-04-29 ENCOUNTER — Telehealth: Payer: Self-pay | Admitting: Orthopedic Surgery

## 2022-04-29 NOTE — Telephone Encounter (Signed)
S/p 04/16/22 5th ray amputation. What is his weight bearing status for PT?

## 2022-04-29 NOTE — Telephone Encounter (Signed)
Arnold from Home health called RO: 1w 1  2w 1  1w 2  Also WBS of pt?  CB [phone]

## 2022-04-30 NOTE — Telephone Encounter (Signed)
Arnold informed.

## 2022-05-01 ENCOUNTER — Ambulatory Visit: Payer: 59 | Admitting: Internal Medicine

## 2022-05-01 ENCOUNTER — Ambulatory Visit (INDEPENDENT_AMBULATORY_CARE_PROVIDER_SITE_OTHER): Payer: 59 | Admitting: Orthopedic Surgery

## 2022-05-01 ENCOUNTER — Encounter: Payer: Self-pay | Admitting: Orthopedic Surgery

## 2022-05-01 DIAGNOSIS — I739 Peripheral vascular disease, unspecified: Secondary | ICD-10-CM

## 2022-05-01 DIAGNOSIS — T8131XA Disruption of external operation (surgical) wound, not elsewhere classified, initial encounter: Secondary | ICD-10-CM

## 2022-05-01 NOTE — Progress Notes (Signed)
Office Visit Note   Patient: Clinton [name]           Date of Birth: Apr 11, 1956           MRN: [id number] Visit Date: 05/01/2022              Requested by: Lujean Amel, MD [address] [place] [address] [place],  [place] [address] PCP: Lujean Amel, MD  Chief Complaint  Patient presents with   Right Foot - Routine Post Op    04/16/22 right 5th ray amputation       HPI: Patient is a 66 year old gentleman who presents status post revascularization to the right lower extremity and status post right fifth ray amputation.  Patient has just followed up with vascular surgery and arterial duplex was obtained.  Patient was advised there were no further revascularization options.  Patient complains of odor pain and black tissue changes.  Patient denies any systemic symptoms.  No fever chills or temperature.  Assessment & Plan: Visit Diagnoses:  1. Peripheral arterial disease ([place])   2. Postoperative wound dehiscence, initial encounter     Plan: Discussed with the patient recommendation to proceed with a transtibial amputation.  Patient does have family visiting from out of town this weekend.  We will plan for surgical intervention next Friday.  Patient is doing extremely well with ambulation with nonweightbearing anticipate he can discharge to home with home health physical therapy.  Follow-Up Instructions: Return in about 3 weeks (around 05/22/2022).   Ortho Exam  Patient is alert, oriented, no adenopathy, well-dressed, normal affect, normal respiratory effort. Examination there is an odor with large black eschar over the lateral aspect of his foot there is no ascending cellulitis.  The foot is tender to palpation.  Imaging: No results found. No images are attached to the encounter.  Labs: Lab Results  Component Value Date   HGBA1C 5.4 04/15/2022   ESRSEDRATE 14 04/08/2022   CRP 2.1 04/08/2022     Lab Results  Component Value Date   ALBUMIN 3.9 08/27/2015    No  results found for: "MG" No results found for: "VD25OH"  No results found for: "PREALBUMIN"    Latest Ref Rng & Units 04/19/2022    2:23 AM 04/18/2022    6:12 AM 04/15/2022    8:37 AM  CBC EXTENDED  WBC 4.0 - 10.5 K/uL 7.8  8.8    RBC 4.22 - 5.81 MIL/uL 3.14  3.31    Hemoglobin 13.0 - 17.0 g/dL 10.3  11.1  12.9   HCT 39.0 - 52.0 % 30.7  33.2  38.0   Platelets 150 - 400 K/uL 174  197       There is no height or weight on file to calculate BMI.  Orders:  No orders of the defined types were placed in this encounter.  No orders of the defined types were placed in this encounter.    Procedures: No procedures performed  Clinical Data: No additional findings.  ROS:  All other systems negative, except as noted in the HPI. Review of Systems  Objective: Vital Signs: There were no vitals taken for this visit.  Specialty Comments:  No specialty comments available.  PMFS History: Patient Active Problem List   Diagnosis Date Noted   PAD (peripheral artery disease) ([place]) 04/17/2022   Acute osteomyelitis of metatarsal bone of right foot ([facility])    Atherosclerosis of native arteries of the extremities with ulceration ([place]) 04/15/2022   Peripheral arterial disease ([place]) 03/25/2022  Obesity 03/21/2022   Prediabetes 03/21/2022   Erectile dysfunction 07/26/2021   Family history of ischemic heart disease (IHD) 07/26/2021   Pure hypercholesterolemia 07/26/2021   History of adenomatous polyp of colon 02/03/2019   Internal hemorrhoids 02/03/2019   Carotid artery stenosis, asymptomatic 09/05/2015   Cellulitis    Hypertension    Hyperlipidemia    Occlusion and stenosis of carotid artery without mention of cerebral infarction 01/01/2012   Past Medical History:  Diagnosis Date   Carotid artery occlusion    Cellulitis    Erectile dysfunction    Hyperlipidemia    Hypertension     Family History  Problem Relation Age of Onset   Other Mother        circulatory problems   Heart  attack Mother    Deep vein thrombosis Mother    Heart disease Mother    Hyperlipidemia Father    Hypertension Father    Diabetes Father    Deep vein thrombosis Father    Heart disease Father        before age 40   Heart attack Father    Peripheral vascular disease Father    Hypertension Brother    Hyperlipidemia Brother     Past Surgical History:  Procedure Laterality Date   ABDOMINAL AORTOGRAM W/LOWER EXTREMITY N/A 02/04/2022   Procedure: ABDOMINAL AORTOGRAM W/LOWER EXTREMITY;  Surgeon: Serafina Mitchell, MD;  Location: [place] [facility];  Service: Cardiovascular;  Laterality: N/A;   ABDOMINAL AORTOGRAM W/LOWER EXTREMITY N/A 02/18/2022   Procedure: ABDOMINAL AORTOGRAM W/LOWER EXTREMITY;  Surgeon: Serafina Mitchell, MD;  Location: [place] [facility];  Service: Cardiovascular;  Laterality: N/A;   ABDOMINAL AORTOGRAM W/LOWER EXTREMITY N/A 04/15/2022   Procedure: ABDOMINAL AORTOGRAM W/LOWER EXTREMITY;  Surgeon: Serafina Mitchell, MD;  Location: [place] [facility];  Service: Cardiovascular;  Laterality: N/A;   AMPUTATION Right 04/16/2022   Procedure: RIGHT 5TH RAY AMPUTATION;  Surgeon: Newt Minion, MD;  Location: [facility];  Service: Orthopedics;  Laterality: Right;   COLONOSCOPY     ENDARTERECTOMY Right 09/05/2015   Procedure: ENDARTERECTOMY CAROTID RIGHT;  Surgeon: Elam Dutch, MD;  [facility];  Service: Vascular;  Laterality: Right;   PATCH ANGIOPLASTY  09/05/2015   Procedure: PATCH ANGIOPLASTY RIGHT CAROTID ARTERY USING HEMASHIELD PLATINUM FINESSE PATCH;  Surgeon: Elam Dutch, MD;  Location: [place];  Service: Vascular;;   PERIPHERAL VASCULAR ATHERECTOMY Left 02/18/2022   Procedure: PERIPHERAL VASCULAR ATHERECTOMY;  Surgeon: Serafina Mitchell, MD;  Location: [place] [facility];  Service: Cardiovascular;  Laterality: Left;  Popliteal and Peroneal   PERIPHERAL VASCULAR BALLOON ANGIOPLASTY  02/04/2022   Procedure: PERIPHERAL VASCULAR BALLOON ANGIOPLASTY;  Surgeon: Serafina Mitchell, MD;   Location: [place] [facility];  Service: Cardiovascular;;   PERIPHERAL VASCULAR BALLOON ANGIOPLASTY Right 04/15/2022   Procedure: PERIPHERAL VASCULAR BALLOON ANGIOPLASTY;  Surgeon: Serafina Mitchell, MD;  Location: [place] [facility];  Service: Cardiovascular;  Laterality: Right;  SFA/POPLITEAL   TONSILLECTOMY     VASECTOMY     Social History   Occupational History   Not on file  Tobacco Use   Smoking status: Never   Smokeless tobacco: Former    Types: Chew    Quit date: 01/01/2015  Vaping Use   Vaping Use: Never used  Substance and Sexual Activity   Alcohol use: Yes    Alcohol/week: 7.0 standard drinks of alcohol    Types: 7 Cans of beer per week    Comment: pt  states that he use to have a beer occasionly but has not had one in about one month   Drug use: No   Sexual activity: Not on file

## 2022-05-06 ENCOUNTER — Telehealth: Payer: Self-pay | Admitting: Orthopedic Surgery

## 2022-05-06 NOTE — Telephone Encounter (Signed)
Patient wife called and wanted to know when her husband surgery date will be [facility]) she wants to know if it can be done tomorrow. The best number to reach  [phone]

## 2022-05-06 NOTE — Telephone Encounter (Signed)
Called and sw pt's wife to advise that he would be sch for this Friday.

## 2022-05-07 ENCOUNTER — Telehealth: Payer: Self-pay | Admitting: Orthopedic Surgery

## 2022-05-07 ENCOUNTER — Other Ambulatory Visit (HOSPITAL_BASED_OUTPATIENT_CLINIC_OR_DEPARTMENT_OTHER): Payer: 59

## 2022-05-07 NOTE — Telephone Encounter (Signed)
Surgery sch to call pt to advise of arrive time on friday

## 2022-05-07 NOTE — Telephone Encounter (Signed)
Pt's wife Jamas Lav called requesting a call back from [facility]. She states it's about his surgery on Friday. Please call her at [phone].

## 2022-05-08 ENCOUNTER — Encounter (HOSPITAL_COMMUNITY): Payer: Self-pay | Admitting: Orthopedic Surgery

## 2022-05-08 ENCOUNTER — Other Ambulatory Visit: Payer: Self-pay

## 2022-05-08 NOTE — Progress Notes (Signed)
Clinton [name] denies chest pain or shortness of breath. Patient denies having any s/s of Covid in his household, also denies any known exposure to Covid.

## 2022-05-09 ENCOUNTER — Inpatient Hospital Stay (HOSPITAL_COMMUNITY)
Admission: RE | Admit: 2022-05-09 | Discharge: 2022-05-14 | DRG: 241 | Disposition: A | Discharge status: Rehabilitation Facility | Payer: 59 | Attending: Orthopedic Surgery | Admitting: Orthopedic Surgery

## 2022-05-09 ENCOUNTER — Other Ambulatory Visit: Payer: Self-pay

## 2022-05-09 ENCOUNTER — Inpatient Hospital Stay (HOSPITAL_COMMUNITY): Payer: 59 | Admitting: Anesthesiology

## 2022-05-09 ENCOUNTER — Encounter (HOSPITAL_COMMUNITY): Payer: Self-pay | Admitting: Orthopedic Surgery

## 2022-05-09 ENCOUNTER — Encounter (HOSPITAL_COMMUNITY)
Admission: RE | Disposition: A | Discharge status: Rehabilitation Facility | Payer: Self-pay | Source: Home / Self Care | Attending: Orthopedic Surgery

## 2022-05-09 DIAGNOSIS — I96 Gangrene, not elsewhere classified: Secondary | ICD-10-CM | POA: Diagnosis present

## 2022-05-09 DIAGNOSIS — I10 Essential (primary) hypertension: Secondary | ICD-10-CM | POA: Diagnosis present

## 2022-05-09 DIAGNOSIS — Z83438 Family history of other disorder of lipoprotein metabolism and other lipidemia: Secondary | ICD-10-CM | POA: Diagnosis not present

## 2022-05-09 DIAGNOSIS — D62 Acute posthemorrhagic anemia: Secondary | ICD-10-CM | POA: Diagnosis not present

## 2022-05-09 DIAGNOSIS — Z89421 Acquired absence of other right toe(s): Secondary | ICD-10-CM | POA: Diagnosis not present

## 2022-05-09 DIAGNOSIS — Z882 Allergy status to sulfonamides status: Secondary | ICD-10-CM | POA: Diagnosis not present

## 2022-05-09 DIAGNOSIS — L97519 Non-pressure chronic ulcer of other part of right foot with unspecified severity: Secondary | ICD-10-CM | POA: Diagnosis present

## 2022-05-09 DIAGNOSIS — N179 Acute kidney failure, unspecified: Secondary | ICD-10-CM | POA: Diagnosis not present

## 2022-05-09 DIAGNOSIS — S88111A Complete traumatic amputation at level between knee and ankle, right lower leg, initial encounter: Principal | ICD-10-CM | POA: Diagnosis present

## 2022-05-09 DIAGNOSIS — Z89511 Acquired absence of right leg below knee: Secondary | ICD-10-CM | POA: Diagnosis not present

## 2022-05-09 DIAGNOSIS — Z8249 Family history of ischemic heart disease and other diseases of the circulatory system: Secondary | ICD-10-CM

## 2022-05-09 DIAGNOSIS — Z880 Allergy status to penicillin: Secondary | ICD-10-CM

## 2022-05-09 DIAGNOSIS — E785 Hyperlipidemia, unspecified: Secondary | ICD-10-CM | POA: Diagnosis present

## 2022-05-09 DIAGNOSIS — Z87891 Personal history of nicotine dependence: Secondary | ICD-10-CM

## 2022-05-09 DIAGNOSIS — R770 Abnormality of albumin: Secondary | ICD-10-CM | POA: Diagnosis not present

## 2022-05-09 HISTORY — DX: Peripheral vascular disease, unspecified: I73.9

## 2022-05-09 HISTORY — PX: AMPUTATION: SHX166

## 2022-05-09 SURGERY — AMPUTATION BELOW KNEE
Anesthesia: Regional | Site: Knee | Laterality: Right

## 2022-05-09 MED ORDER — PROPOFOL 10 MG/ML IV BOLUS
INTRAVENOUS | Status: DC | PRN
  Administered 2022-05-09: 170 mg via INTRAVENOUS
  Administered 2022-05-09: 30 mg via INTRAVENOUS

## 2022-05-09 MED ORDER — LIDOCAINE 2% (20 MG/ML) 5 ML SYRINGE
INTRAMUSCULAR | Status: DC | PRN
  Administered 2022-05-09: 80 mg via INTRAVENOUS

## 2022-05-09 MED ORDER — ASPIRIN 81 MG PO TBEC
81.0000 mg | DELAYED_RELEASE_TABLET | Freq: Every day | ORAL | Status: DC
  Administered 2022-05-09 – 2022-05-13 (×5): 81 mg via ORAL
  Filled 2022-05-09 (×5): qty 1

## 2022-05-09 MED ORDER — AMLODIPINE BESYLATE 2.5 MG PO TABS
2.5000 mg | ORAL_TABLET | Freq: Every day | ORAL | Status: DC
  Administered 2022-05-09 – 2022-05-13 (×4): 2.5 mg via ORAL
  Filled 2022-05-09 (×5): qty 1

## 2022-05-09 MED ORDER — POTASSIUM CHLORIDE CRYS ER 20 MEQ PO TBCR: 20.0000 meq | EXTENDED_RELEASE_TABLET | Freq: Every day | ORAL | Status: DC | PRN

## 2022-05-09 MED ORDER — SODIUM CHLORIDE 0.9 % IV SOLN: INTRAVENOUS | Status: DC

## 2022-05-09 MED ORDER — ACETAMINOPHEN 10 MG/ML IV SOLN: 1000.0000 mg | Freq: Once | INTRAVENOUS | Status: DC | PRN

## 2022-05-09 MED ORDER — LIDOCAINE 2% (20 MG/ML) 5 ML SYRINGE
INTRAMUSCULAR | Status: AC
  Filled 2022-05-09: qty 10

## 2022-05-09 MED ORDER — LACTATED RINGERS IV SOLN: INTRAVENOUS | Status: DC

## 2022-05-09 MED ORDER — FENTANYL CITRATE (PF) 100 MCG/2ML IJ SOLN
INTRAMUSCULAR | Status: AC
  Filled 2022-05-09: qty 2

## 2022-05-09 MED ORDER — VANCOMYCIN HCL IN DEXTROSE 1-5 GM/200ML-% IV SOLN
1000.0000 mg | INTRAVENOUS | Status: AC
  Administered 2022-05-09: 1000 mg via INTRAVENOUS
  Filled 2022-05-09: qty 200

## 2022-05-09 MED ORDER — FENTANYL CITRATE (PF) 250 MCG/5ML IJ SOLN
INTRAMUSCULAR | Status: DC | PRN
  Administered 2022-05-09: 50 ug via INTRAVENOUS

## 2022-05-09 MED ORDER — PANTOPRAZOLE SODIUM 40 MG PO TBEC
40.0000 mg | DELAYED_RELEASE_TABLET | Freq: Every day | ORAL | Status: DC
  Administered 2022-05-09 – 2022-05-14 (×6): 40 mg via ORAL
  Filled 2022-05-09 (×6): qty 1

## 2022-05-09 MED ORDER — HYDROCHLOROTHIAZIDE 12.5 MG PO TABS
12.5000 mg | ORAL_TABLET | Freq: Every day | ORAL | Status: DC
  Administered 2022-05-09 – 2022-05-13 (×4): 12.5 mg via ORAL
  Filled 2022-05-09 (×5): qty 1

## 2022-05-09 MED ORDER — ROPIVACAINE HCL 5 MG/ML IJ SOLN
INTRAMUSCULAR | Status: DC | PRN
  Administered 2022-05-09: 40 mL via PERINEURAL

## 2022-05-09 MED ORDER — DEXAMETHASONE SODIUM PHOSPHATE 10 MG/ML IJ SOLN
INTRAMUSCULAR | Status: AC
  Filled 2022-05-09: qty 2

## 2022-05-09 MED ORDER — DOCUSATE SODIUM 100 MG PO CAPS
100.0000 mg | ORAL_CAPSULE | Freq: Every day | ORAL | Status: DC
  Administered 2022-05-10 – 2022-05-13 (×4): 100 mg via ORAL
  Filled 2022-05-09 (×5): qty 1

## 2022-05-09 MED ORDER — ACETAMINOPHEN 325 MG PO TABS
325.0000 mg | ORAL_TABLET | Freq: Four times a day (QID) | ORAL | Status: DC | PRN
  Administered 2022-05-11 – 2022-05-14 (×4): 650 mg via ORAL
  Filled 2022-05-09 (×4): qty 2

## 2022-05-09 MED ORDER — OXYCODONE HCL 5 MG PO TABS
10.0000 mg | ORAL_TABLET | ORAL | Status: DC | PRN
  Administered 2022-05-10 – 2022-05-13 (×20): 15 mg via ORAL
  Filled 2022-05-09 (×20): qty 3

## 2022-05-09 MED ORDER — MAGNESIUM SULFATE 2 GM/50ML IV SOLN: 2.0000 g | Freq: Every day | INTRAVENOUS | Status: DC | PRN

## 2022-05-09 MED ORDER — CEFAZOLIN SODIUM-DEXTROSE 2-4 GM/100ML-% IV SOLN
2.0000 g | Freq: Three times a day (TID) | INTRAVENOUS | Status: AC
  Administered 2022-05-09 – 2022-05-10 (×2): 2 g via INTRAVENOUS
  Filled 2022-05-09 (×2): qty 100

## 2022-05-09 MED ORDER — HYDROMORPHONE HCL 1 MG/ML IJ SOLN
0.5000 mg | INTRAMUSCULAR | Status: DC | PRN
  Administered 2022-05-10: 1 mg via INTRAVENOUS
  Filled 2022-05-09 (×3): qty 1

## 2022-05-09 MED ORDER — METOPROLOL TARTRATE 5 MG/5ML IV SOLN: 2.0000 mg | INTRAVENOUS | Status: DC | PRN

## 2022-05-09 MED ORDER — VITAMIN C 500 MG PO TABS
1000.0000 mg | ORAL_TABLET | Freq: Every day | ORAL | Status: DC
  Administered 2022-05-09 – 2022-05-14 (×6): 1000 mg via ORAL
  Filled 2022-05-09 (×6): qty 2

## 2022-05-09 MED ORDER — DEXAMETHASONE SODIUM PHOSPHATE 10 MG/ML IJ SOLN
INTRAMUSCULAR | Status: AC
  Filled 2022-05-09: qty 1

## 2022-05-09 MED ORDER — FENTANYL CITRATE (PF) 100 MCG/2ML IJ SOLN
INTRAMUSCULAR | Status: AC
  Administered 2022-05-09: 50 ug
  Filled 2022-05-09: qty 2

## 2022-05-09 MED ORDER — FENTANYL CITRATE (PF) 250 MCG/5ML IJ SOLN
INTRAMUSCULAR | Status: AC
  Filled 2022-05-09: qty 5

## 2022-05-09 MED ORDER — ONDANSETRON HCL 4 MG/2ML IJ SOLN: 4.0000 mg | Freq: Four times a day (QID) | INTRAMUSCULAR | Status: DC | PRN

## 2022-05-09 MED ORDER — ALUM & MAG HYDROXIDE-SIMETH 200-200-20 MG/5ML PO SUSP: 15.0000 mL | ORAL | Status: DC | PRN

## 2022-05-09 MED ORDER — PHENYLEPHRINE HCL-NACL 20-0.9 MG/250ML-% IV SOLN
INTRAVENOUS | Status: DC | PRN
  Administered 2022-05-09: 50 ug/min via INTRAVENOUS

## 2022-05-09 MED ORDER — GUAIFENESIN-DM 100-10 MG/5ML PO SYRP: 15.0000 mL | ORAL_SOLUTION | ORAL | Status: DC | PRN

## 2022-05-09 MED ORDER — CHLORHEXIDINE GLUCONATE 4 % EX LIQD: 60.0000 mL | Freq: Once | CUTANEOUS | Status: DC

## 2022-05-09 MED ORDER — MAGNESIUM CITRATE PO SOLN: 1.0000 | Freq: Once | ORAL | Status: DC | PRN

## 2022-05-09 MED ORDER — ONDANSETRON HCL 4 MG/2ML IJ SOLN
INTRAMUSCULAR | Status: AC
  Filled 2022-05-09: qty 2

## 2022-05-09 MED ORDER — JUVEN PO PACK
1.0000 | PACK | Freq: Two times a day (BID) | ORAL | Status: DC
  Administered 2022-05-10 – 2022-05-14 (×9): 1 via ORAL
  Filled 2022-05-09 (×8): qty 1

## 2022-05-09 MED ORDER — TRANEXAMIC ACID 1000 MG/10ML IV SOLN
2000.0000 mg | INTRAVENOUS | Status: DC
  Filled 2022-05-09: qty 20

## 2022-05-09 MED ORDER — IRBESARTAN 300 MG PO TABS
300.0000 mg | ORAL_TABLET | Freq: Every day | ORAL | Status: DC
  Administered 2022-05-09 – 2022-05-13 (×4): 300 mg via ORAL
  Filled 2022-05-09 (×5): qty 1

## 2022-05-09 MED ORDER — PROPOFOL 10 MG/ML IV BOLUS
INTRAVENOUS | Status: AC
  Filled 2022-05-09: qty 20

## 2022-05-09 MED ORDER — BISACODYL 5 MG PO TBEC: 5.0000 mg | DELAYED_RELEASE_TABLET | Freq: Every day | ORAL | Status: DC | PRN

## 2022-05-09 MED ORDER — MIDAZOLAM HCL 2 MG/2ML IJ SOLN: 2.0000 mg | Freq: Once | INTRAMUSCULAR | Status: AC

## 2022-05-09 MED ORDER — PHENOL 1.4 % MT LIQD: 1.0000 | OROMUCOSAL | Status: DC | PRN

## 2022-05-09 MED ORDER — PHENYLEPHRINE 80 MCG/ML (10ML) SYRINGE FOR IV PUSH (FOR BLOOD PRESSURE SUPPORT)
PREFILLED_SYRINGE | INTRAVENOUS | Status: DC | PRN
  Administered 2022-05-09: 80 ug via INTRAVENOUS
  Administered 2022-05-09 (×2): 160 ug via INTRAVENOUS

## 2022-05-09 MED ORDER — FENTANYL CITRATE (PF) 100 MCG/2ML IJ SOLN
25.0000 ug | INTRAMUSCULAR | Status: DC | PRN
  Administered 2022-05-09: 50 ug via INTRAVENOUS

## 2022-05-09 MED ORDER — MIDAZOLAM HCL 2 MG/2ML IJ SOLN
INTRAMUSCULAR | Status: AC
  Administered 2022-05-09: 2 mg via INTRAVENOUS
  Filled 2022-05-09: qty 2

## 2022-05-09 MED ORDER — POVIDONE-IODINE 10 % EX SWAB: 2.0000 | Freq: Once | CUTANEOUS | Status: DC

## 2022-05-09 MED ORDER — MIDAZOLAM HCL 2 MG/2ML IJ SOLN
INTRAMUSCULAR | Status: AC
  Filled 2022-05-09: qty 2

## 2022-05-09 MED ORDER — IRBESARTAN-HYDROCHLOROTHIAZIDE 300-12.5 MG PO TABS: 1.0000 | ORAL_TABLET | Freq: Every day | ORAL | Status: DC

## 2022-05-09 MED ORDER — PHENYLEPHRINE 80 MCG/ML (10ML) SYRINGE FOR IV PUSH (FOR BLOOD PRESSURE SUPPORT)
PREFILLED_SYRINGE | INTRAVENOUS | Status: AC
  Filled 2022-05-09: qty 20

## 2022-05-09 MED ORDER — 0.9 % SODIUM CHLORIDE (POUR BTL) OPTIME
TOPICAL | Status: DC | PRN
  Administered 2022-05-09 (×2): 500 mL

## 2022-05-09 MED ORDER — ZINC SULFATE 220 (50 ZN) MG PO CAPS
220.0000 mg | ORAL_CAPSULE | Freq: Every day | ORAL | Status: DC
  Administered 2022-05-09 – 2022-05-14 (×6): 220 mg via ORAL
  Filled 2022-05-09 (×6): qty 1

## 2022-05-09 MED ORDER — CHLORHEXIDINE GLUCONATE 0.12 % MT SOLN
15.0000 mL | OROMUCOSAL | Status: AC
  Administered 2022-05-09: 15 mL via OROMUCOSAL
  Filled 2022-05-09 (×2): qty 15

## 2022-05-09 MED ORDER — CLOPIDOGREL BISULFATE 75 MG PO TABS
75.0000 mg | ORAL_TABLET | Freq: Every day | ORAL | Status: DC
  Administered 2022-05-09 – 2022-05-13 (×5): 75 mg via ORAL
  Filled 2022-05-09 (×5): qty 1

## 2022-05-09 MED ORDER — HYDRALAZINE HCL 20 MG/ML IJ SOLN: 5.0000 mg | INTRAMUSCULAR | Status: DC | PRN

## 2022-05-09 MED ORDER — LIDOCAINE 2% (20 MG/ML) 5 ML SYRINGE
INTRAMUSCULAR | Status: AC
  Filled 2022-05-09: qty 5

## 2022-05-09 MED ORDER — LABETALOL HCL 5 MG/ML IV SOLN: 10.0000 mg | INTRAVENOUS | Status: DC | PRN

## 2022-05-09 MED ORDER — ONDANSETRON HCL 4 MG/2ML IJ SOLN
INTRAMUSCULAR | Status: AC
  Filled 2022-05-09: qty 4

## 2022-05-09 MED ORDER — ATORVASTATIN CALCIUM 40 MG PO TABS
40.0000 mg | ORAL_TABLET | Freq: Every day | ORAL | Status: DC
  Administered 2022-05-09 – 2022-05-13 (×5): 40 mg via ORAL
  Filled 2022-05-09 (×5): qty 1

## 2022-05-09 MED ORDER — OXYCODONE HCL 5 MG PO TABS
5.0000 mg | ORAL_TABLET | ORAL | Status: DC | PRN
  Administered 2022-05-09 – 2022-05-14 (×3): 10 mg via ORAL
  Administered 2022-05-14: 5 mg via ORAL
  Filled 2022-05-09: qty 2
  Filled 2022-05-09: qty 1
  Filled 2022-05-09 (×2): qty 2

## 2022-05-09 MED ORDER — FENTANYL CITRATE PF 50 MCG/ML IJ SOSY: 50.0000 ug | PREFILLED_SYRINGE | Freq: Once | INTRAMUSCULAR | Status: DC

## 2022-05-09 MED ORDER — DEXAMETHASONE SODIUM PHOSPHATE 10 MG/ML IJ SOLN
INTRAMUSCULAR | Status: DC | PRN
  Administered 2022-05-09: 10 mg via INTRAVENOUS

## 2022-05-09 MED ORDER — POLYETHYLENE GLYCOL 3350 17 G PO PACK: 17.0000 g | PACK | Freq: Every day | ORAL | Status: DC | PRN

## 2022-05-09 MED ORDER — ONDANSETRON HCL 4 MG/2ML IJ SOLN
INTRAMUSCULAR | Status: DC | PRN
  Administered 2022-05-09: 4 mg via INTRAVENOUS

## 2022-05-09 SURGICAL SUPPLY — 42 items
BAG COUNTER SPONGE SURGICOUNT (BAG) IMPLANT
BAG SPNG CNTER NS LX DISP (BAG) ×1
BLADE SAW RECIP 87.9 MT (BLADE) ×2 IMPLANT
BLADE SURG 21 STRL SS (BLADE) ×2 IMPLANT
BNDG COHESIVE 6X5 TAN STRL LF (GAUZE/BANDAGES/DRESSINGS) IMPLANT
CANISTER WOUND CARE 500ML ATS (WOUND CARE) ×2 IMPLANT
COVER SURGICAL LIGHT HANDLE (MISCELLANEOUS) ×2 IMPLANT
CUFF TOURN SGL QUICK 34 (TOURNIQUET CUFF) ×1
CUFF TRNQT CYL 34X4.125X (TOURNIQUET CUFF) ×2 IMPLANT
DRAPE DERMATAC (DRAPES) IMPLANT
DRAPE INCISE IOBAN 66X45 STRL (DRAPES) ×2 IMPLANT
DRAPE U-SHAPE 47X51 STRL (DRAPES) ×2 IMPLANT
DRESSING PREVENA PLUS CUSTOM (GAUZE/BANDAGES/DRESSINGS) ×2 IMPLANT
DRSG MEPITEL 8X12 (GAUZE/BANDAGES/DRESSINGS) IMPLANT
DRSG PREVENA PLUS CUSTOM (GAUZE/BANDAGES/DRESSINGS) ×1
DURAPREP 26ML APPLICATOR (WOUND CARE) ×2 IMPLANT
ELECT REM PT RETURN 9FT ADLT (ELECTROSURGICAL) ×1
ELECTRODE REM PT RTRN 9FT ADLT (ELECTROSURGICAL) ×2 IMPLANT
GLOVE BIOGEL PI IND STRL 9 (GLOVE) ×2 IMPLANT
GLOVE SURG ORTHO 9.0 STRL STRW (GLOVE) ×2 IMPLANT
GOWN STRL REUS W/ TWL XL LVL3 (GOWN DISPOSABLE) ×4 IMPLANT
GOWN STRL REUS W/TWL XL LVL3 (GOWN DISPOSABLE) ×1
GRAFT SKIN WND MICRO 38 (Tissue) IMPLANT
GRAFT SKIN WND OMEGA3 SB 7X10 (Tissue) IMPLANT
KIT BASIN OR (CUSTOM PROCEDURE TRAY) ×2 IMPLANT
KIT TURNOVER KIT B (KITS) ×2 IMPLANT
MANIFOLD NEPTUNE II (INSTRUMENTS) ×2 IMPLANT
NS IRRIG 1000ML POUR BTL (IV SOLUTION) ×2 IMPLANT
PACK ORTHO EXTREMITY (CUSTOM PROCEDURE TRAY) ×2 IMPLANT
PAD ARMBOARD 7.5X6 YLW CONV (MISCELLANEOUS) ×2 IMPLANT
PREVENA RESTOR ARTHOFORM 46X30 (CANNISTER) ×2 IMPLANT
SPONGE T-LAP 18X18 ~~LOC~~+RFID (SPONGE) IMPLANT
STAPLER VISISTAT 35W (STAPLE) IMPLANT
STOCKINETTE IMPERVIOUS LG (DRAPES) ×2 IMPLANT
SUT ETHILON 2 0 PSLX (SUTURE) IMPLANT
SUT SILK 2 0 (SUTURE) ×1
SUT SILK 2-0 18XBRD TIE 12 (SUTURE) ×2 IMPLANT
SUT VIC AB 1 CT1 36 (SUTURE) IMPLANT
SUT VIC AB 1 CTX 27 (SUTURE) ×4 IMPLANT
TOWEL GREEN STERILE (TOWEL DISPOSABLE) ×2 IMPLANT
TUBE CONNECTING 12X1/4 (SUCTIONS) ×2 IMPLANT
YANKAUER SUCT BULB TIP NO VENT (SUCTIONS) ×2 IMPLANT

## 2022-05-09 NOTE — Anesthesia Preprocedure Evaluation (Addendum)
Anesthesia Evaluation  Patient identified by MRN, date of birth, ID band Patient awake    Reviewed: Allergy & Precautions, NPO status , Patient's Chart, lab work & pertinent test results  Airway Mallampati: II  TM Distance: >3 FB Neck ROM: Full    Dental no notable dental hx.    Pulmonary neg pulmonary ROS,    Pulmonary exam normal        Cardiovascular hypertension, Pt. on medications + Peripheral Vascular Disease   Rhythm:Regular Rate:Normal     Neuro/Psych negative neurological ROS  negative psych ROS   GI/Hepatic negative GI ROS, Neg liver ROS,   Endo/Other  negative endocrine ROS  Renal/GU negative Renal ROS  negative genitourinary   Musculoskeletal Gangrenous foot    Abdominal Normal abdominal exam  (+)   Peds  Hematology negative hematology ROS (+)   Anesthesia Other Findings   Reproductive/Obstetrics                            Anesthesia Physical Anesthesia Plan  ASA: 3  Anesthesia Plan: General and Regional   Post-op Pain Management: Regional block*   Induction: Intravenous  PONV Risk Score and Plan: 2 and Ondansetron, Dexamethasone, Midazolam and Treatment may vary due to age or medical condition  Airway Management Planned: Mask and LMA  Additional Equipment: None  Intra-op Plan:   Post-operative Plan: Extubation in OR  Informed Consent: I have reviewed the patients History and Physical, chart, labs and discussed the procedure including the risks, benefits and alternatives for the proposed anesthesia with the patient or authorized representative who has indicated his/her understanding and acceptance.       Plan Discussed with: CRNA  Anesthesia Plan Comments:         Anesthesia Quick Evaluation

## 2022-05-09 NOTE — Transfer of Care (Signed)
Immediate Anesthesia Transfer of Care Note  Patient: Clinton [name]  Procedure(s) Performed: RIGHT BELOW KNEE AMPUTATION (Right: Knee)  Patient Location: PACU  Anesthesia Type:GA combined with regional for post-op pain  Level of Consciousness: awake, alert  and oriented  Airway & Oxygen Therapy: Patient Spontanous Breathing and Patient connected to face mask oxygen  Post-op Assessment: Report given to RN and Post -op Vital signs reviewed and stable  Post vital signs: Reviewed and stable  Last Vitals:  Vitals Value Taken Time  BP 127/63 05/09/22 1417  Temp    Pulse 70 05/09/22 1419  Resp 15 05/09/22 1419  SpO2 100 % 05/09/22 1419  Vitals shown include unvalidated device data.  Last Pain:  Vitals:   05/09/22 1300  TempSrc:   PainSc: 0-No pain      Patients Stated Pain Goal: 5 (16/10/96 0454)  Complications: No notable events documented.

## 2022-05-09 NOTE — Anesthesia Procedure Notes (Signed)
Anesthesia Regional Block: Popliteal block   Pre-Anesthetic Checklist: , timeout performed,  Correct Patient, Correct Site, Correct Laterality,  Correct Procedure, Correct Position, site marked,  Risks and benefits discussed,  Surgical consent,  Pre-op evaluation,  At surgeon's request and post-op pain management  Laterality: Right  Prep: Dura Prep       Needles:  Injection technique: Single-shot  Needle Type: Echogenic Stimulator Needle     Needle Length: 10cm  Needle Gauge: 20     Additional Needles:   Procedures:,,,, ultrasound used (permanent image in chart),,    Narrative:  Start time: 05/09/2022 12:57 PM End time: 05/09/2022 12:59 PM Injection made incrementally with aspirations every 5 mL.  Performed by: Personally  Anesthesiologist: Darral Dash, DO  Additional Notes: Patient identified. Risks/Benefits/Options discussed with patient including but not limited to bleeding, infection, nerve damage, failed block, incomplete pain control. Patient expressed understanding and wished to proceed. All questions were answered. Sterile technique was used throughout the entire procedure. Please see nursing notes for vital signs. Aspirated in 5cc intervals with injection for negative confirmation. Patient was given instructions on fall risk and not to get out of bed. All questions and concerns addressed with instructions to call with any issues or inadequate analgesia.

## 2022-05-09 NOTE — H&P (Signed)
Clinton [name] is an 66 y.o. male.   Chief Complaint: Painful gangrenous ulceration right foot HPI: Clinton [name] is a 66 year old gentleman who presents status post revascularization to the right lower extremity and status post right fifth ray amputation.  Clinton [name] has just followed up with vascular surgery and arterial duplex was obtained.  Clinton [name] was advised there were no further revascularization options.  Clinton [name] complains of odor pain and black tissue changes.  Clinton [name] denies any systemic symptoms.  No fever chills or temperature.  Past Medical History:  Diagnosis Date   Carotid artery occlusion    Cellulitis    Erectile dysfunction    Hyperlipidemia    Hypertension    Peripheral vascular disease (HCC)     Past Surgical History:  Procedure Laterality Date   ABDOMINAL AORTOGRAM W/LOWER EXTREMITY N/A 02/04/2022   Procedure: ABDOMINAL AORTOGRAM W/LOWER EXTREMITY;  Surgeon: Nada Libman, MD;  Location: [facility];  Service: Cardiovascular;  Laterality: N/A;   ABDOMINAL AORTOGRAM W/LOWER EXTREMITY N/A 02/18/2022   Procedure: ABDOMINAL AORTOGRAM W/LOWER EXTREMITY;  Surgeon: Nada Libman, MD;  Location: [facility];  Service: Cardiovascular;  Laterality: N/A;   ABDOMINAL AORTOGRAM W/LOWER EXTREMITY N/A 04/15/2022   Procedure: ABDOMINAL AORTOGRAM W/LOWER EXTREMITY;  Surgeon: Nada Libman, MD;  Location: [facility];  Service: Cardiovascular;  Laterality: N/A;   AMPUTATION Right 04/16/2022   Procedure: RIGHT 5TH RAY AMPUTATION;  Surgeon: Nadara Mustard, MD;  Location[facility] OR;  Service: Orthopedics;  Laterality: Right;   COLONOSCOPY     ENDARTERECTOMY Right 09/05/2015   Procedure: ENDARTERECTOMY CAROTID RIGHT;  Surgeon: Sherren Kerns, MD;  Location: [facility] OR;  Service: Vascular;  Laterality: Right;   PATCH ANGIOPLASTY  09/05/2015   Procedure: PATCH ANGIOPLASTY RIGHT CAROTID ARTERY USING HEMASHIELD PLATINUM FINESSE PATCH;  Surgeon: Sherren Kerns, MD;  Location: [facility] OR;   Service: Vascular;;   PERIPHERAL VASCULAR ATHERECTOMY Left 02/18/2022   Procedure: PERIPHERAL VASCULAR ATHERECTOMY;  Surgeon: Nada Libman, MD;  Location: [facility];  Service: Cardiovascular;  Laterality: Left;  Popliteal and Peroneal   PERIPHERAL VASCULAR BALLOON ANGIOPLASTY  02/04/2022   Procedure: PERIPHERAL VASCULAR BALLOON ANGIOPLASTY;  Surgeon: Nada Libman, MD;  Location: [facility];  Service: Cardiovascular;;   PERIPHERAL VASCULAR BALLOON ANGIOPLASTY Right 04/15/2022   Procedure: PERIPHERAL VASCULAR BALLOON ANGIOPLASTY;  Surgeon: Nada Libman, MD;  Location: [facility];  Service: Cardiovascular;  Laterality: Right;  SFA/POPLITEAL   TONSILLECTOMY     VASECTOMY      Family History  Problem Relation Age of Onset   Other Mother        circulatory problems   Heart attack Mother    Deep vein thrombosis Mother    Heart disease Mother    Hyperlipidemia Father    Hypertension Father    Diabetes Father    Deep vein thrombosis Father    Heart disease Father        before age 27   Heart attack Father    Peripheral vascular disease Father    Hypertension Brother    Hyperlipidemia Brother    Social History:  reports that he has never smoked. He quit smokeless tobacco use about 7 years ago.  His smokeless tobacco use included chew. He reports that he does not currently use alcohol. He reports that he does not use drugs.  Allergies:  Allergies  Allergen Reactions   Penicillins Rash    Has Clinton [name] had a  PCN reaction causing immediate rash, facial/tongue/throat swelling, SOB or lightheadedness with hypotension: Yes Has Clinton [name] had a PCN reaction causing severe rash involving mucus membranes or skin necrosis: No Has Clinton [name] had a PCN reaction that required hospitalization No Has Clinton [name] had a PCN reaction occurring within the last 10 years: No If all of the above answers are "NO", then may proceed with Cephalosporin use.    Sulfa Antibiotics Swelling and  Other (See Comments)    Facial/lip swelling    No medications prior to admission.    No results found for this or any previous visit (from the past 48 hour(s)). No results found.  Review of Systems  All other systems reviewed and are negative.   There were no vitals taken for this visit. Physical Exam  Clinton [name] is alert, oriented, no adenopathy, well-dressed, normal affect, normal respiratory effort. Examination there is an odor with large black eschar over the lateral aspect of his foot there is no ascending cellulitis.  The foot is tender to palpation. Assessment/Plan 1. Peripheral arterial disease ([employer])   2. Postoperative wound dehiscence, initial encounter       Plan: Discussed with the Clinton [name] recommendation to proceed with a transtibial amputation.  Clinton [name] does have family visiting from out of town this weekend.  We will plan for surgical intervention next Friday.  Clinton [name] is doing extremely well with ambulation with nonweightbearing anticipate he can discharge to home with home health physical therapy.  Newt Minion, MD 05/09/2022, 6:45 AM

## 2022-05-09 NOTE — Op Note (Addendum)
05/09/2022  2:11 PM  PATIENT:  Clinton [name]    PRE-OPERATIVE DIAGNOSIS:  Gangrene Right Foot  POST-OPERATIVE DIAGNOSIS:  Same  PROCEDURE:  RIGHT BELOW KNEE AMPUTATION Application kerecis micro 38cm2, 7x10 cm sheet  SURGEON:  Newt Minion, MD  ANESTHESIA:   General  PREOPERATIVE INDICATIONS:  Clinton [name] is a  66 y.o. male with a diagnosis of Gangrene Right Foot who failed conservative measures and elected for surgical management.    The risks benefits and alternatives were discussed with the patient preoperatively including but not limited to the risks of infection, bleeding, nerve injury, cardiopulmonary complications, the need for revision surgery, among others, and the patient was willing to proceed.  OPERATIVE IMPLANTS: Kerecis micro graft 38 cm and Kerecis sheet 7 x 10 cm.   OPERATIVE FINDINGS: calcified vessels  OPERATIVE PROCEDURE: Patient was brought to the operating room after undergoing a regional anesthetic.  After adequate levels anesthesia were obtained a thigh tourniquet was placed and the lower extremity was prepped using DuraPrep draped into a sterile field. The foot was draped out of the sterile field with impervious stockinette.  A timeout was called and the tourniquet inflated.  A transverse skin incision was made 12 cm distal to the tibial tubercle, the incision curved proximally, and a large posterior flap was created.  The tibia was transected just proximal to the skin incision and beveled anteriorly.  The fibula was transected just proximal to the tibial incision.  The sciatic nerve was pulled cut and allowed to retract.  The vascular bundles were suture ligated with 2-0 silk.  The tourniquet was deflated and hemostasis obtained.    Drill holes were placed through the tibia and fibula to secure the [facility] tissue graft and the gastrocnemius fascia.    The Kerecis micro powder 38 cm was applied to the open wound that has a 200 cm surface area.  The 7  x 10 cm Kerecis sheet was then folded and secured to the distal tibia and fibula with #1 Vicryl.  A separate drill hole was then used to secure the [facility] tissue graft and the gastrocnemius fascia to the dorsum of the tibia.    The deep and superficial fascial layers were closed using #1 Vicryl.  The skin was closed using staples.    The Prevena customizable dressing was applied this was overwrapped with the arthroform sponge.  Charlie Pitter was used to secure the sponges and the circumferential compression was secured to the skin with Dermatac.  This was connected to the wound VAC pump and had a good suction fit this was covered with a stump shrinker and a limb protector.  Patient was taken to the PACU in stable condition.   DISCHARGE PLANNING:  Antibiotic duration: 24-hour antibiotics  Weightbearing: Nonweightbearing on the operative extremity  Pain medication: Opioid pathway  Dressing care/ Wound VAC: Continue wound VAC with the Prevena plus pump at discharge for 1 week  Ambulatory devices: Walker or kneeling scooter  Discharge to: Discharge planning based on recommendations per physical therapy  Follow-up: In the office 1 week after discharge.

## 2022-05-09 NOTE — Anesthesia Procedure Notes (Signed)
Anesthesia Regional Block: Adductor canal block   Pre-Anesthetic Checklist: , timeout performed,  Correct Patient, Correct Site, Correct Laterality,  Correct Procedure, Correct Position, site marked,  Risks and benefits discussed,  Surgical consent,  Pre-op evaluation,  At surgeon's request and post-op pain management  Laterality: Right  Prep: Dura Prep       Needles:  Injection technique: Single-shot  Needle Type: Echogenic Stimulator Needle     Needle Length: 10cm  Needle Gauge: 20     Additional Needles:   Procedures:,,,, ultrasound used (permanent image in chart),,    Narrative:  Start time: 05/09/2022 12:54 PM End time: 05/09/2022 12:56 PM Injection made incrementally with aspirations every 5 mL.  Performed by: Personally  Anesthesiologist: Darral Dash, DO  Additional Notes: Patient identified. Risks/Benefits/Options discussed with patient including but not limited to bleeding, infection, nerve damage, failed block, incomplete pain control. Patient expressed understanding and wished to proceed. All questions were answered. Sterile technique was used throughout the entire procedure. Please see nursing notes for vital signs. Aspirated in 5cc intervals with injection for negative confirmation. Patient was given instructions on fall risk and not to get out of bed. All questions and concerns addressed with instructions to call with any issues or inadequate analgesia.

## 2022-05-09 NOTE — Plan of Care (Signed)
  Problem: Education: Goal: Knowledge of the prescribed therapeutic regimen will improve Outcome: Not Progressing Goal: Ability to verbalize activity precautions or restrictions will improve Outcome: Not Progressing Goal: Understanding of discharge needs will improve Outcome: Not Progressing   Problem: Activity: Goal: Ability to perform//tolerate increased activity and mobilize with assistive devices will improve Outcome: Not Progressing   Problem: Clinical Measurements: Goal: Postoperative complications will be avoided or minimized Outcome: Not Progressing   Problem: Self-Care: Goal: Ability to meet self-care needs will improve Outcome: Not Progressing   Problem: Self-Concept: Goal: Ability to maintain and perform role responsibilities to the fullest extent possible will improve Outcome: Not Progressing   

## 2022-05-09 NOTE — Anesthesia Postprocedure Evaluation (Signed)
Anesthesia Post Note  Patient: Clinton [name]  Procedure(s) Performed: RIGHT BELOW KNEE AMPUTATION (Right: Knee)     Patient location during evaluation: PACU Anesthesia Type: Regional and General Level of consciousness: awake and alert Pain management: pain level controlled Vital Signs Assessment: post-procedure vital signs reviewed and stable Respiratory status: spontaneous breathing, nonlabored ventilation, respiratory function stable and patient connected to nasal cannula oxygen Cardiovascular status: blood pressure returned to baseline and stable Postop Assessment: no apparent nausea or vomiting Anesthetic complications: no   No notable events documented.  Last Vitals:  Vitals:   05/09/22 1900 05/09/22 1915  BP: 125/74 131/66  Pulse: 77 81  Resp: 20 19  Temp:    SpO2: 99% 97%    Last Pain:  Vitals:   05/09/22 1900  TempSrc:   PainSc: 0-No pain                 Belenda Cruise P Chrisotpher Rivero

## 2022-05-09 NOTE — Anesthesia Procedure Notes (Signed)
Procedure Name: LMA Insertion Date/Time: 05/09/2022 1:14 PM  Performed by: Carolan Clines, CRNAPre-anesthesia Checklist: Patient identified, Emergency Drugs available, Suction available and Patient being monitored Patient Re-evaluated:Patient Re-evaluated prior to induction Oxygen Delivery Method: Circle System Utilized Preoxygenation: Pre-oxygenation with 100% oxygen Induction Type: IV induction LMA: LMA inserted LMA Size: 4.0 Number of attempts: 1 Placement Confirmation: positive ETCO2 Tube secured with: Tape Dental Injury: Teeth and Oropharynx as per pre-operative assessment

## 2022-05-10 ENCOUNTER — Other Ambulatory Visit: Payer: Self-pay

## 2022-05-10 LAB — BASIC METABOLIC PANEL
Anion gap: 9 (ref 5–15)
BUN: 21 mg/dL (ref 8–23)
CO2: 25 mmol/L (ref 22–32)
Calcium: 9 mg/dL (ref 8.9–10.3)
Chloride: 103 mmol/L (ref 98–111)
Creatinine, Ser: 1.1 mg/dL (ref 0.61–1.24)
GFR, Estimated: >60 mL/min (ref >60)
Glucose, Bld: 146 mg/dL — ABNORMAL HIGH (ref 70–99)
Potassium: 4.4 mmol/L (ref 3.5–5.1)
Sodium: 137 mmol/L (ref 135–145)

## 2022-05-10 LAB — CBC
HCT: 29.1 % — ABNORMAL LOW (ref 39.0–52.0)
Hemoglobin: 9.8 g/dL — ABNORMAL LOW (ref 13.0–17.0)
MCH: 31.9 pg (ref 26.0–34.0)
MCHC: 33.7 g/dL (ref 30.0–36.0)
MCV: 94.8 fL (ref 80.0–100.0)
Platelets: 321 10*3/uL (ref 150–400)
RBC: 3.07 MIL/uL — ABNORMAL LOW (ref 4.22–5.81)
RDW: 11.7 % (ref 11.5–15.5)
WBC: 8.6 10*3/uL (ref 4.0–10.5)
nRBC: 0 % (ref 0.0–0.2)

## 2022-05-10 NOTE — Progress Notes (Signed)
Patient ID: Clinton [name], male   DOB: 04-11-1956, 66 y.o.   MRN: [id number] Postoperative day 1 right below-knee amputation.  There is no drainage in the wound VAC canister.  Patient states he feels better than he did before surgery.  Plan for physical therapy anticipate patient could benefit from inpatient rehab.

## 2022-05-10 NOTE — Plan of Care (Signed)
  Problem: Education: Goal: Knowledge of the prescribed therapeutic regimen will improve Outcome: Not Progressing Goal: Ability to verbalize activity precautions or restrictions will improve Outcome: Not Progressing Goal: Understanding of discharge needs will improve Outcome: Not Progressing   Problem: Activity: Goal: Ability to perform//tolerate increased activity and mobilize with assistive devices will improve Outcome: Not Progressing   Problem: Clinical Measurements: Goal: Postoperative complications will be avoided or minimized Outcome: Not Progressing   Problem: Self-Care: Goal: Ability to meet self-care needs will improve Outcome: Not Progressing   Problem: Self-Concept: Goal: Ability to maintain and perform role responsibilities to the fullest extent possible will improve Outcome: Not Progressing

## 2022-05-10 NOTE — Evaluation (Signed)
Occupational Therapy Evaluation Patient Details Name: Clinton [name] MRN: [id number] DOB: [birth year] Today's Date: 05/10/2022   History of Present Illness Pt is a 66 yo male admitted with painful gangrenous ulceration right foot and now s/p R BKA. PHMx: HTN, Bil LE ulcers, carotid artery occlusion, PAD, cellulitis   Clinical Impression   This 66 yo male admitted and underwent above presents to acute OT with PLOF of being Mod I with all basic ADLs at some form of DME level (crutches, knee scooter). Currently pt is at a setup/S-min A for basic ADLs at a RW level and could greatly benefit from a short AIR stay to get him to a Mod I level. We will continue to follow.      Recommendations for follow up therapy are one component of a multi-disciplinary discharge planning process, led by the attending physician.  Recommendations may be updated based on patient status, additional functional criteria and insurance authorization.   Follow Up Recommendations  Acute inpatient rehab (3hours/day)    Assistance Recommended at Discharge Frequent or constant Supervision/Assistance  Patient can return home with the following A little help with walking and/or transfers;A little help with bathing/dressing/bathroom;Assistance with cooking/housework;Assist for transportation;Help with stairs or ramp for entrance    Functional Status Assessment  Patient has had a recent decline in their functional status and demonstrates the ability to make significant improvements in function in a reasonable and predictable amount of time.  Equipment Recommendations  None recommended by OT    Recommendations for Other Services Rehab consult     Precautions / Restrictions Precautions Precautions: Fall Restrictions Weight Bearing Restrictions: Yes RLE Weight Bearing: Non weight bearing      Mobility Bed Mobility Overal bed mobility: Needs Assistance Bed Mobility: Supine to Sit     Supine to sit: Min guard, HOB  elevated          Transfers Overall transfer level: Needs assistance Equipment used: Rolling walker (2 wheels) Transfers: Sit to/from Stand Sit to Stand: Min assist                  Balance Overall balance assessment: Needs assistance Sitting-balance support: No upper extremity supported (LLE supported) Sitting balance-Leahy Scale: Good     Standing balance support: Bilateral upper extremity supported, During functional activity, Reliant on assistive device for balance Standing balance-Leahy Scale: Poor Standing balance comment: Reliant on AD and needing min guard for safety                           ADL either performed or assessed with clinical judgement   ADL Overall ADL's : Needs assistance/impaired Eating/Feeding: Independent;Sitting Eating/Feeding Details (indicate cue type and reason): recliner or EOB Grooming: Set up;Sitting Grooming Details (indicate cue type and reason): recliner or EOB Upper Body Bathing: Set up;Sitting Upper Body Bathing Details (indicate cue type and reason): recliner or EOB Lower Body Bathing: Minimal assistance;Sit to/from stand   Upper Body Dressing : Set up;Sitting Upper Body Dressing Details (indicate cue type and reason): recliner or EOB Lower Body Dressing: Minimal assistance;Sit to/from stand   Toilet Transfer: Minimal assistance;Ambulation;Rolling walker (2 wheels) Toilet Transfer Details (indicate cue type and reason): simulated bed>ambulation out door of room>sit in recliner behind him Toileting- Clothing Manipulation and Hygiene: Minimal assistance;Sit to/from Nurse, children's Details (indicate cue type and reason): Spoke about using his 3n1 in his shower stall at home once the surgeon has cleared him to  get residual limb wet         Vision Patient Visual Report: No change from baseline              Pertinent Vitals/Pain Pain Assessment Pain Assessment: Faces Faces Pain Scale: Hurts even  more Pain Location: RLE with ambulation Pain Descriptors / Indicators: Aching Pain Intervention(s): Limited activity within patient's tolerance, Monitored during session, Repositioned, Patient requesting pain meds-RN notified, RN gave pain meds during session     Hand Dominance  right   Extremity/Trunk Assessment Upper Extremity Assessment Upper Extremity Assessment: Overall WFL for tasks assessed   Lower Extremity Assessment Lower Extremity Assessment: RLE deficits/detail RLE Deficits / Details: R BKA   Cervical / Trunk Assessment Cervical / Trunk Assessment: Kyphotic   Communication Communication Communication: No difficulties   Cognition Arousal/Alertness: Awake/alert Behavior During Therapy: WFL for tasks assessed/performed Overall Cognitive Status: Within Functional Limits for tasks assessed                                                  Home Living Family/patient expects to be discharged to:: Private residence Living Arrangements: Spouse/significant other Available Help at Discharge: Family;Available 24 hours/day Type of Home: House Home Access: Stairs to enter [employer] of Steps: 2 Entrance Stairs-Rails: Can reach both Home Layout: One level     Bathroom Shower/Tub: Teacher, early years/pre: Standard Bathroom Accessibility: Yes   Home Equipment: Conservation officer, nature (2 wheels);Crutches;BSC/3in1 (knee scooter)      Lives With: Spouse    Prior Functioning/Environment Prior Level of Function : Independent/Modified Independent             Mobility Comments: using crutches to mobilize, up and down steps, RW to get into shower, knee scooter, crutches in and out of bathroom ADLs Comments: wife changes dressing, pt independent with bathing and dressing, working from home        OT Problem List: Decreased strength;Decreased range of motion;Impaired balance (sitting and/or standing);Pain      OT  Treatment/Interventions: Self-care/ADL training;DME and/or AE instruction;Patient/family education;Balance training    OT Goals(Current goals can be found in the care plan section) Acute Rehab OT Goals Patient Stated Goal: to go to inpatient rehab then home OT Goal Formulation: With patient Time For Goal Achievement: 05/24/22 Potential to Achieve Goals: Good  OT Frequency: Min 2X/week    Co-evaluation PT/OT/SLP Co-Evaluation/Treatment: Yes Reason for Co-Treatment: For patient/therapist safety;To address functional/ADL transfers PT goals addressed during session: Mobility/safety with mobility;Balance;Proper use of DME;Strengthening/ROM OT goals addressed during session: Strengthening/ROM;ADL's and self-care      AM-PAC OT "6 Clicks" Daily Activity     Outcome Measure Help from another person eating meals?: None Help from another person taking care of personal grooming?: A Little Help from another person toileting, which includes using toliet, bedpan, or urinal?: A Little Help from another person bathing (including washing, rinsing, drying)?: A Little Help from another person to put on and taking off regular upper body clothing?: A Little Help from another person to put on and taking off regular lower body clothing?: A Little 6 Click Score: 19   End of Session Equipment Utilized During Treatment: Gait belt;Rolling walker (2 wheels) Nurse Communication: Mobility status (skin tear at top lateral side of upper part of residual limb protector (looks like a tape blister that had popped). We applied a  small square mepilex)  Activity Tolerance: Patient tolerated treatment well Patient left: in chair;with call bell/phone within reach;with chair alarm set  OT Visit Diagnosis: Unsteadiness on feet (R26.81);Pain Pain - Right/Left: Right Pain - part of body:  (residual limb)                Time: 2703-5009 OT Time Calculation (min): 33 min Charges:  OT General Charges $OT Visit: 1 Visit OT  Evaluation $OT Eval Moderate Complexity: 1 Mod  Ignacia Palma, OTR/L Acute [employer] [facility] [phone] Office [phone]    Evette Georges 05/10/2022, 11:18 AM

## 2022-05-10 NOTE — Evaluation (Signed)
Physical Therapy Evaluation Patient Details Name: Clinton [name] MRN: [id number] DOB: 10/02/1955 Today's Date: 05/10/2022  History of Present Illness  Pt is a 66 yo male admitted with painful gangrenous ulceration right foot and now s/p R BKA. PHMx: HTN, Bil LE ulcers, carotid artery occlusion, PAD, cellulitis  Clinical Impression  PTA pt living with wife in single story home with 4 steps to enter. Since prior vascular surgery the beginning of September pt was utilizing combination of knee scooter, RW and crutches for mobility, and wife was assisting with ADLs and iADLs. Pt is currently limited in safe mobility by shift in CoG and associated decrease in balance, in presence of operative pain and fatigue from hop to pattern with RW. Pt is min guard for bed mobility, and min Ax2 for transfers and ambulation with RW. Pt with strong desire to return to obtain prosthetic and return to PLOF, and has good 24 hour family support at discharge. As such PT feels pt would make an excellent candidate for AIR level rehab prior to return home.        Recommendations for follow up therapy are one component of a multi-disciplinary discharge planning process, led by the attending physician.  Recommendations may be updated based on patient status, additional functional criteria and insurance authorization.  Follow Up Recommendations Acute inpatient rehab (3hours/day)      Assistance Recommended at Discharge Frequent or constant Supervision/Assistance  Patient can return home with the following  Assistance with cooking/housework;A little help with walking and/or transfers;A little help with bathing/dressing/bathroom;Assist for transportation;Help with stairs or ramp for entrance    Equipment Recommendations None recommended by PT  Recommendations for Other Services  Rehab consult    Functional Status Assessment Patient has had a recent decline in their functional status and demonstrates the ability to make  significant improvements in function in a reasonable and predictable amount of time.     Precautions / Restrictions Precautions Precautions: Fall Restrictions Weight Bearing Restrictions: Yes RLE Weight Bearing: Non weight bearing      Mobility  Bed Mobility Overal bed mobility: Needs Assistance Bed Mobility: Supine to Sit     Supine to sit: Min guard, HOB elevated          Transfers Overall transfer level: Needs assistance Equipment used: Rolling walker (2 wheels) Transfers: Sit to/from Stand Sit to Stand: Min assist           General transfer comment: vc for hand placement, and sequencing, good power up, light min A for steadying in upright    Ambulation/Gait Ambulation/Gait assistance: Min assist, +2 safety/equipment Gait Distance (Feet): 60 Feet Assistive device: Rolling walker (2 wheels) Gait Pattern/deviations: Step-to pattern Gait velocity: decreased Gait velocity interpretation: <1.31 ft/sec, indicative of household ambulator   General Gait Details: hop to on L LE with RW, vc for use of triceps, not shoulders to facilitate UE support with hops, multiple standing rest breaks required, close chair follow provided but not necessary      Balance Overall balance assessment: Needs assistance Sitting-balance support: No upper extremity supported (LLE supported) Sitting balance-Leahy Scale: Good     Standing balance support: Bilateral upper extremity supported, During functional activity, Reliant on assistive device for balance Standing balance-Leahy Scale: Poor Standing balance comment: Reliant on AD and needing min guard for safety                             Pertinent Vitals/Pain Pain Assessment  Pain Assessment: Faces Faces Pain Scale: Hurts even more Pain Location: RLE with ambulation Pain Descriptors / Indicators: Aching Pain Intervention(s): Limited activity within patient's tolerance, Monitored during session, Repositioned, Patient  requesting pain meds-RN notified, RN gave pain meds during session    Home Living Family/patient expects to be discharged to:: Private residence Living Arrangements: Spouse/significant other Available Help at Discharge: Family;Available 24 hours/day Type of Home: House Home Access: Stairs to enter Entrance Stairs-Rails: Can reach both Entrance Stairs-Number of Steps: 2   Home Layout: One level Home Equipment: Agricultural consultant (2 wheels);Crutches;BSC/3in1 (knee scooter)      Prior Function Prior Level of Function : Independent/Modified Independent             Mobility Comments: using crutches to mobilize, up and down steps, RW to get into shower, knee scooter, crutches in and out of bathroom ADLs Comments: wife changes dressing, pt independent with bathing and dressing, working from home        Extremity/Trunk Assessment   Upper Extremity Assessment Upper Extremity Assessment: Defer to OT evaluation    Lower Extremity Assessment Lower Extremity Assessment: RLE deficits/detail RLE Deficits / Details: R BKA    Cervical / Trunk Assessment Cervical / Trunk Assessment: Kyphotic  Communication   Communication: No difficulties  Cognition Arousal/Alertness: Awake/alert Behavior During Therapy: WFL for tasks assessed/performed, Anxious (slightly anxious about mobility and performance, repeatedly states he wishes his wife was there) Overall Cognitive Status: Within Functional Limits for tasks assessed Area of Impairment: Problem solving                             Problem Solving: Slow processing General Comments: Overall WFL, slight anxiety about performance with mobilization and remembering what was said during session        General Comments General comments (skin integrity, edema, etc.): VSS on RA, pain medication administered during session, education provided about ROM needed in R knee and hip for successful prosthetics placement in future         Assessment/Plan    PT Assessment Patient needs continued PT services  PT Problem List Decreased strength;Decreased activity tolerance;Decreased balance;Decreased mobility;Decreased knowledge of use of DME;Decreased knowledge of precautions;Decreased safety awareness;Pain       PT Treatment Interventions DME instruction;Gait training;Stair training;Functional mobility training;Therapeutic activities;Therapeutic exercise;Balance training;Patient/family education    PT Goals (Current goals can be found in the Care Plan section)  Acute Rehab PT Goals Patient Stated Goal: to get back to yard work PT Goal Formulation: With patient/family Time For Goal Achievement: 05/24/22 Potential to Achieve Goals: Good    Frequency Min 3X/week     Co-evaluation PT/OT/SLP Co-Evaluation/Treatment: Yes Reason for Co-Treatment: For patient/therapist safety PT goals addressed during session: Mobility/safety with mobility OT goals addressed during session: Strengthening/ROM       AM-PAC PT "6 Clicks" Mobility  Outcome Measure Help needed turning from your back to your side while in a flat bed without using bedrails?: A Little Help needed moving from lying on your back to sitting on the side of a flat bed without using bedrails?: A Little Help needed moving to and from a bed to a chair (including a wheelchair)?: A Little Help needed standing up from a chair using your arms (e.g., wheelchair or bedside chair)?: A Little Help needed to walk in hospital room?: A Little Help needed climbing 3-5 steps with a railing? : Total 6 Click Score: 16    End of Session  Equipment Utilized During Treatment: Gait belt Activity Tolerance: Patient tolerated treatment well Patient left: in chair;with call bell/phone within reach;with nursing/sitter in room Nurse Communication: Mobility status PT Visit Diagnosis: Unsteadiness on feet (R26.81);Muscle weakness (generalized) (M62.81);Difficulty in walking, not elsewhere  classified (R26.2)    Time: 7591-6384 PT Time Calculation (min) (ACUTE ONLY): 43 min   Charges:   PT Evaluation $PT Eval Moderate Complexity: 1 Mod          Pernell Lenoir B. Beverely Risen PT, DPT Acute Rehabilitation Services Please use secure chat or  Call Office [phone]   Elon Alas [facility] 05/10/2022, 11:56 AM

## 2022-05-11 LAB — BASIC METABOLIC PANEL
Anion gap: 10 (ref 5–15)
BUN: 33 mg/dL — ABNORMAL HIGH (ref 8–23)
CO2: 26 mmol/L (ref 22–32)
Calcium: 8.9 mg/dL (ref 8.9–10.3)
Chloride: 103 mmol/L (ref 98–111)
Creatinine, Ser: 1.53 mg/dL — ABNORMAL HIGH (ref 0.61–1.24)
GFR, Estimated: 50 mL/min — ABNORMAL LOW (ref >60)
Glucose, Bld: 100 mg/dL — ABNORMAL HIGH (ref 70–99)
Potassium: 4.1 mmol/L (ref 3.5–5.1)
Sodium: 139 mmol/L (ref 135–145)

## 2022-05-11 LAB — CBC
HCT: 28 % — ABNORMAL LOW (ref 39.0–52.0)
Hemoglobin: 9.3 g/dL — ABNORMAL LOW (ref 13.0–17.0)
MCH: 32.3 pg (ref 26.0–34.0)
MCHC: 33.2 g/dL (ref 30.0–36.0)
MCV: 97.2 fL (ref 80.0–100.0)
Platelets: 303 10*3/uL (ref 150–400)
RBC: 2.88 MIL/uL — ABNORMAL LOW (ref 4.22–5.81)
RDW: 12.1 % (ref 11.5–15.5)
WBC: 9.4 10*3/uL (ref 4.0–10.5)
nRBC: 0 % (ref 0.0–0.2)

## 2022-05-11 NOTE — Progress Notes (Signed)
  Subjective: Patient stable.  Wound VAC functional.  Pain minimal.   Objective: Vital signs in last 24 hours: Temp:  [98.6 F (37 C)-98.7 F (37.1 C)] 98.6 F (37 C) (09/30 2024) Pulse Rate:  [78-96] 89 (09/30 2238) Resp:  [16-18] 16 (09/30 2238) BP: (94-106)/(51-56) 105/55 (10/01 0033) SpO2:  [98 %] 98 % (09/30 2024)  Intake/Output from previous day: 09/30 0701 - 10/01 0700 In: 240 [P.O.:240] Out: 400 [Urine:400] Intake/Output this shift: No intake/output data recorded.  Exam:  No cellulitis present  Labs: Recent Labs    05/10/22 0306 05/11/22 0407  HGB 9.8* 9.3*   Recent Labs    05/10/22 0306 05/11/22 0407  WBC 8.6 9.4  RBC 3.07* 2.88*  HCT 29.1* 28.0*  PLT 321 303   Recent Labs    05/10/22 0306 05/11/22 0407  NA 137 139  K 4.4 4.1  CL 103 103  CO2 25 26  BUN 21 33*  CREATININE 1.10 1.53*  GLUCOSE 146* 100*  CALCIUM 9.0 8.9   No results for input(s): "LABPT", "INR" in the last 72 hours.  Assessment/Plan: Plan at this time is continue with wound VAC.  Anticipate discharge this week.  Pain is better with this surgery than when he had his toe amputated.   Clinton [name] Clinton [name] 05/11/2022, 10:39 AM

## 2022-05-11 NOTE — Progress Notes (Signed)
IP rehab admissions - I met with patient and his wife, son, family at the bedside.  I explained about rehab options and I gave patient rehab booklets.  We discussed inpatient rehab.  Patient would like to try for inpatient rehab admission.  I explained that his insurance may deny CIR and that we have limited rehab beds today.  I will open the case with [facility] tomorrow morning and will begin pre-auth process.  I will have my partners follow up tomorrow.  Call me for questions.  [phone]

## 2022-05-11 NOTE — PMR Pre-admission (Signed)
PMR Admission Coordinator Pre-Admission Assessment  Patient: Clinton [name] is an 66 y.o., male MRN: [id number] DOB: 1956-06-04 Height: '5\' 10"'  (177.8 cm) Weight: 95.3 kg  Insurance Information HMO: ***    PPO: ***     PCP: ***     IPA: ***     80/20: ***     OTHER: *** PRIMARY: UHC options      Policy#: [id number]      Subscriber: patient CM Name: ***      Phone#: ***     Fax#: *** Pre-Cert#: ***      Employer: [employer] Benefits:  Phone #: [phone]     Name: *** Eff. Date: ***     Deduct: ***      Out of Pocket Max: ***      Life Max: *** CIR: ***      SNF: *** Outpatient: ***     Co-Pay: *** Home Health: ***      Co-Pay: *** DME: ***     Co-Pay: *** Providers: in network  SECONDARY:       Policy#:      Phone#:   Development worker, community:       Phone#:   The Engineer, petroleum" for patients in Inpatient Rehabilitation Facilities with attached "Privacy Act [place] Records" was provided and verbally reviewed with: N/A  Emergency Contact Information Contact Information     Name Relation Home Work Mobile   Spencer Spouse [phone]  [phone]       Current Medical History  Patient Admitting Diagnosis: R BKA  History of Present Illness: A 66 year old gentleman who presents status post revascularization to the right lower extremity and status post right fifth ray amputation.  Patient has just followed up with vascular surgery and arterial duplex was obtained.  Patient was advised there were no further revascularization options.  Patient complains of odor pain and black tissue changes.  Patient underwent R BKA with VAC placement on 05/09/22 by Dr. Sharol Given.  PT/OT evaluations completed with recommendations for acute inpatient rehab admission.  Patient's medical record from [facility] has been reviewed by the rehabilitation admission coordinator and physician.  Past Medical History  Past Medical History:  Diagnosis Date   Carotid artery  occlusion    Cellulitis    Erectile dysfunction    Hyperlipidemia    Hypertension    Peripheral vascular disease ([place])     Has the patient had major surgery during 100 days prior to admission? Yes  Family History   family history includes Deep vein thrombosis in his father and mother; Diabetes in his father; Heart attack in his father and mother; Heart disease in his father and mother; Hyperlipidemia in his brother and father; Hypertension in his brother and father; Other in his mother; Peripheral vascular disease in his father.  Current Medications  Current Facility-Administered Medications:    0.9 %  sodium chloride infusion, , Intravenous, Continuous, Newt Minion, MD, Last Rate: 75 mL/hr at 05/09/22 2215, Restarted at 05/09/22 2215   acetaminophen (TYLENOL) tablet 325-650 mg, 325-650 mg, Oral, Q6H PRN, Newt Minion, MD   alum & mag hydroxide-simeth (MAALOX/MYLANTA) 200-200-20 MG/5ML suspension 15-30 mL, 15-30 mL, Oral, Q2H PRN, Newt Minion, MD   amLODipine (NORVASC) tablet 2.5 mg, 2.5 mg, Oral, QHS, Newt Minion, MD, 2.5 mg at 05/09/22 2141   ascorbic acid (VITAMIN C) tablet 1,000 mg, 1,000 mg, Oral, Daily, Newt Minion, MD, 1,000 mg at 05/11/22 0807   aspirin EC  tablet 81 mg, 81 mg, Oral, QHS, Newt Minion, MD, 81 mg at 05/10/22 2240   atorvastatin (LIPITOR) tablet 40 mg, 40 mg, Oral, QHS, Newt Minion, MD, 40 mg at 05/10/22 2240   bisacodyl (DULCOLAX) EC tablet 5 mg, 5 mg, Oral, Daily PRN, Newt Minion, MD   clopidogrel (PLAVIX) tablet 75 mg, 75 mg, Oral, QHS, Newt Minion, MD, 75 mg at 05/10/22 2240   docusate sodium (COLACE) capsule 100 mg, 100 mg, Oral, Daily, Newt Minion, MD, 100 mg at 05/11/22 0807   guaiFENesin-dextromethorphan (ROBITUSSIN DM) 100-10 MG/5ML syrup 15 mL, 15 mL, Oral, Q4H PRN, Newt Minion, MD   hydrALAZINE (APRESOLINE) injection 5 mg, 5 mg, Intravenous, Q20 Min PRN, Newt Minion, MD   irbesartan (AVAPRO) tablet 300 mg, 300 mg, Oral,  QHS, 300 mg at 05/09/22 2133 **AND** hydrochlorothiazide (HYDRODIURIL) tablet 12.5 mg, 12.5 mg, Oral, QHS, Newt Minion, MD, 12.5 mg at 05/09/22 2132   HYDROmorphone (DILAUDID) injection 0.5-1 mg, 0.5-1 mg, Intravenous, Q4H PRN, Newt Minion, MD, 1 mg at 05/10/22 1316   labetalol (NORMODYNE) injection 10 mg, 10 mg, Intravenous, Q10 min PRN, Newt Minion, MD   magnesium citrate solution 1 Bottle, 1 Bottle, Oral, Once PRN, Newt Minion, MD   magnesium sulfate IVPB 2 g 50 mL, 2 g, Intravenous, Daily PRN, Newt Minion, MD   metoprolol tartrate (LOPRESSOR) injection 2-5 mg, 2-5 mg, Intravenous, Q2H PRN, Newt Minion, MD   nutrition supplement (JUVEN) (JUVEN) powder packet 1 packet, 1 packet, Oral, BID BM, Newt Minion, MD, 1 packet at 05/11/22 0807   ondansetron [facility]) injection 4 mg, 4 mg, Intravenous, Q6H PRN, Newt Minion, MD   oxyCODONE (Oxy IR/ROXICODONE) immediate release tablet 10-15 mg, 10-15 mg, Oral, Q4H PRN, Newt Minion, MD, 15 mg at 05/11/22 8938   oxyCODONE (Oxy IR/ROXICODONE) immediate release tablet 5-10 mg, 5-10 mg, Oral, Q4H PRN, Newt Minion, MD, 10 mg at 05/09/22 2132   pantoprazole (PROTONIX) EC tablet 40 mg, 40 mg, Oral, Daily, Newt Minion, MD, 40 mg at 05/11/22 0807   phenol (CHLORASEPTIC) mouth spray 1 spray, 1 spray, Mouth/Throat, PRN, Newt Minion, MD   polyethylene glycol (MIRALAX / GLYCOLAX) packet 17 g, 17 g, Oral, Daily PRN, Newt Minion, MD   potassium chloride SA (KLOR-CON M) CR tablet 20-40 mEq, 20-40 mEq, Oral, Daily PRN, Newt Minion, MD   zinc sulfate capsule 220 mg, 220 mg, Oral, Daily, Newt Minion, MD, 220 mg at 05/11/22 0807  Patients Current Diet:  Diet Order             Diet Carb Modified Fluid consistency: Thin; Room service appropriate? Yes  Diet effective now                   Precautions / Restrictions Precautions Precautions: Fall Restrictions Weight Bearing Restrictions: No RLE Weight Bearing: Non weight  bearing   Has the patient had 2 or more falls or a fall with injury in the past year? No  Prior Activity Level Limited Community (1-2x/wk): Went out 2-3 times a week for appointments.  Was driving.  Using crutches in the house, uses scooter as needed.  Prior Functional Level Self Care: Did the patient need help bathing, dressing, using the toilet or eating? Independent  Indoor Mobility: Did the patient need assistance with walking from room to room (with or without device)? Independent  Stairs: Did the patient need  assistance with internal or external stairs (with or without device)? Independent  Functional Cognition: Did the patient need help planning regular tasks such as shopping or remembering to take medications? Independent  Patient Information Are you of Hispanic, Latino/a,or Spanish origin?: A. No, not of Hispanic, Latino/a, or Spanish origin What is your race?: A. White Do you need or want an interpreter to communicate with a doctor or health care staff?: 0. No  Patient's Response To:  Health Literacy and Transportation Is the patient able to respond to health literacy and transportation needs?: Yes Health Literacy - How often do you need to have someone help you when you read instructions, pamphlets, or other written material from your doctor or pharmacy?: Never In the past 12 months, has lack of transportation kept you from medical appointments or from getting medications?: No In the past 12 months, has lack of transportation kept you from meetings, work, or from getting things needed for daily living?: No  Home Assistive Devices / [place] Devices/Equipment: Environmental consultant (specify type), Crutches Home Equipment: Rolling Walker (2 wheels), Crutches, BSC/3in1 (knee scooter)  Prior Device Use: Indicate devices/aids used by the patient prior to current illness, exacerbation or injury? Motorized wheelchair or scooter and crutches  Current Functional  Level Cognition  Overall Cognitive Status: Within Functional Limits for tasks assessed Orientation Level: Oriented X4 General Comments: Overall WFL, slight anxiety about performance with mobilization and remembering what was said during session    Extremity Assessment (includes Sensation/Coordination)  Upper Extremity Assessment: Defer to OT evaluation  Lower Extremity Assessment: RLE deficits/detail RLE Deficits / Details: R BKA    ADLs  Overall ADL's : Needs assistance/impaired Eating/Feeding: Independent, Sitting Eating/Feeding Details (indicate cue type and reason): recliner or EOB Grooming: Set up, Sitting Grooming Details (indicate cue type and reason): recliner or EOB Upper Body Bathing: Set up, Sitting Upper Body Bathing Details (indicate cue type and reason): recliner or EOB Lower Body Bathing: Minimal assistance, Sit to/from stand Upper Body Dressing : Set up, Sitting Upper Body Dressing Details (indicate cue type and reason): recliner or EOB Lower Body Dressing: Minimal assistance, Sit to/from stand Toilet Transfer: Minimal assistance, Ambulation, Rolling walker (2 wheels) Toilet Transfer Details (indicate cue type and reason): simulated bed>ambulation out door of room>sit in recliner behind him Toileting- Water quality scientist and Hygiene: Minimal assistance, Sit to/from Insurance underwriter Details (indicate cue type and reason): Spoke about using his 3n1 in his shower stall at home once the surgeon has cleared him to get residual limb wet    Mobility  Overal bed mobility: Needs Assistance Bed Mobility: Supine to Sit Supine to sit: Min guard, HOB elevated    Transfers  Overall transfer level: Needs assistance Equipment used: Rolling walker (2 wheels) Transfers: Sit to/from Stand Sit to Stand: Min assist General transfer comment: vc for hand placement, and sequencing, good power up, light min A for steadying in upright    Ambulation / Gait / Stairs /  Wheelchair Mobility  Ambulation/Gait Ambulation/Gait assistance: Min assist, +2 safety/equipment Gait Distance (Feet): 60 Feet Assistive device: Rolling walker (2 wheels) Gait Pattern/deviations: Step-to pattern General Gait Details: hop to on L LE with RW, vc for use of triceps, not shoulders to facilitate UE support with hops, multiple standing rest breaks required, close chair follow provided but not necessary Gait velocity: decreased Gait velocity interpretation: <1.31 ft/sec, indicative of household ambulator    Posture / Balance Balance Overall balance assessment: Needs assistance Sitting-balance support: No upper extremity supported (LLE  supported) Sitting balance-Leahy Scale: Good Standing balance support: Bilateral upper extremity supported, During functional activity, Reliant on assistive device for balance Standing balance-Leahy Scale: Poor Standing balance comment: Reliant on AD and needing min guard for safety    Special needs/care consideration Wound Vac Yes in place to R BKA stump site, Skin Dressing and VAC in place post op R BKA, and Special service needs ***   Previous Home Environment (from acute therapy documentation) Living Arrangements: Spouse/significant other  Lives With: Spouse Available Help at Discharge: Family, Available 24 hours/day Type of Home: House Home Layout: One level Home Access: Stairs to enter Entrance Stairs-Rails: Can reach both Entrance Stairs-Number of Steps: 2 Bathroom Shower/Tub: Optometrist: Yes Home Care Services: No  Discharge Living Setting Plans for Discharge Living Setting: Patient's home, House, Lives with (comment) (Lives with wife.) Type of Home at Discharge: House Discharge Home Layout: One level Discharge Home Access: Stairs to enter Entrance Stairs-Rails: Can reach both, Left, Right Entrance Stairs-Number of Steps: 2 at garage entry Discharge Bathroom Shower/Tub:  Walk-in shower Discharge Bathroom Accessibility: Yes How Accessible: Accessible via walker Does the patient have any problems obtaining your medications?: No  Social/Family/Support Systems Patient Roles: Spouse Contact Information: Wife Anticipated Caregiver: Jamas Lav - wife - [phone] Ability/Limitations of Caregiver: Wife works PT 2 days a week from 10 to 1:30 on Th-Fri in October.  She can provide supervision and light assist for patient. Caregiver Availability: 24/7 (Mostly 24/7 assist.) Discharge Plan Discussed with Primary Caregiver: Yes Is Caregiver In Agreement with Plan?: Yes Does Caregiver/Family have Issues with Lodging/Transportation while Pt is in Rehab?: No  Goals Patient/Family Goal for Rehab: PT/OT supervision go mod I goals Expected length of stay: 7 days Pt/Family Agrees to Admission and willing to participate: Yes Program Orientation Provided & Reviewed with Pt/Caregiver Including Roles  & Responsibilities: Yes  Decrease burden of Care through IP rehab admission: N/A  Possible need for SNF placement upon discharge: Not anticipated  Patient Condition: I have reviewed medical records from [facility], spoken with CM, and patient, spouse, son, and family member. I met with patient at the bedside for inpatient rehabilitation assessment.  Patient will benefit from ongoing PT and OT, can actively participate in 3 hours of therapy a day 5 days of the week, and can make measurable gains during the admission.  Patient will also benefit from the coordinated team approach during an Inpatient Acute Rehabilitation admission.  The patient will receive intensive therapy as well as Rehabilitation physician, nursing, social worker, and care management interventions.  Due to bladder management, bowel management, safety, skin/wound care, disease management, medication administration, pain management, and patient education the patient requires 24 hour a day rehabilitation nursing.  The  patient is currently *** with mobility and basic ADLs.  Discharge setting and therapy post discharge at home with home health is anticipated.  Patient has agreed to participate in the Acute Inpatient Rehabilitation Program and will admit {Time; today/tomorrow:10263}.  Preadmission Screen Completed By:  Retta Diones, 05/11/2022 1:09 PM ______________________________________________________________________   Discussed status with Dr. [name] Kitchen on *** at *** and received approval for admission today.  Admission Coordinator:  Retta Diones, RN, time [name] KitchenSudie Grumbling ***   Assessment/Plan: Diagnosis: Does the need for close, 24 hr/day Medical supervision in concert with the patient's rehab needs make it unreasonable for this patient to be served in a less intensive setting? {yes_no_potentially:3041433} Co-Morbidities requiring supervision/potential complications: *** Due to {due LK:5625638}, does the patient require  24 hr/day rehab nursing? {yes_no_potentially:3041433} Does the patient require coordinated care of a physician, rehab nurse, PT, OT, and SLP to address physical and functional deficits in the context of the above medical diagnosis(es)? {yes_no_potentially:3041433} Addressing deficits in the following areas: {deficits:3041436} Can the patient actively participate in an intensive therapy program of at least 3 hrs of therapy 5 days a week? {yes_no_potentially:3041433} The potential for patient to make measurable gains while on inpatient rehab is {potential:3041437} Anticipated functional outcomes upon discharge from inpatient rehab: {functional outcomes:304600100} PT, {functional outcomes:304600100} OT, {functional outcomes:304600100} SLP Estimated rehab length of stay to reach the above functional goals is: *** Anticipated discharge destination: {anticipated dc setting:21604} 10. Overall Rehab/Functional Prognosis: {potential:3041437}   MD Signature: ***

## 2022-05-12 ENCOUNTER — Encounter (HOSPITAL_COMMUNITY): Payer: Self-pay | Admitting: Orthopedic Surgery

## 2022-05-12 LAB — BASIC METABOLIC PANEL
Anion gap: 7 (ref 5–15)
BUN: 40 mg/dL — ABNORMAL HIGH (ref 8–23)
CO2: 25 mmol/L (ref 22–32)
Calcium: 8.5 mg/dL — ABNORMAL LOW (ref 8.9–10.3)
Chloride: 103 mmol/L (ref 98–111)
Creatinine, Ser: 1.55 mg/dL — ABNORMAL HIGH (ref 0.61–1.24)
GFR, Estimated: 49 mL/min — ABNORMAL LOW (ref >60)
Glucose, Bld: 118 mg/dL — ABNORMAL HIGH (ref 70–99)
Potassium: 4.4 mmol/L (ref 3.5–5.1)
Sodium: 135 mmol/L (ref 135–145)

## 2022-05-12 LAB — SURGICAL PATHOLOGY

## 2022-05-12 NOTE — Progress Notes (Signed)
Inpatient Rehab Admissions Coordinator:   Insurance was opened this morning for potential admit to CIR. Will continue to follow and await response.   Rehab Admissons Coordinator [place], [place], [place] [phone]

## 2022-05-12 NOTE — Plan of Care (Signed)
  Problem: Education: Goal: Knowledge of the prescribed therapeutic regimen will improve Outcome: Progressing   Problem: Pain Management: Goal: Pain level will decrease with appropriate interventions Outcome: Progressing   Problem: Skin Integrity: Goal: Demonstration of wound healing without infection will improve Outcome: Progressing   

## 2022-05-12 NOTE — Progress Notes (Signed)
Occupational Therapy Treatment Patient Details Name: Clinton [name] MRN: [id number] DOB: [birth year] Today's Date: 05/12/2022   History of present illness Pt is a 66 yo male admitted 05/09/22 with painful gangrenous ulceration right foot and now s/p R BKA 05/09/22. PHMx: HTN, Bil LE ulcers, carotid artery occlusion, PAD, cellulitis   OT comments  Patient seated on EOB after completing PT session. Patient was min assist to stand and ambulate to bathroom and perform transfer to [facility] over toilet with min assist and cues for hand placement. Patient took several minutes to attempt to use bathroom and asked to return to EOB. Patient performed UE strengthening exercises while seated on EOB with blue therapy band and patient asked to attempt using bathroom again. Patient was able to use bathroom and required min assist to complete toilet hygiene. Patient is very motivated towards therapy and progressing.  Acute OT to continue to follow.    Recommendations for follow up therapy are one component of a multi-disciplinary discharge planning process, led by the attending physician.  Recommendations may be updated based on patient status, additional functional criteria and insurance authorization.    Follow Up Recommendations  Acute inpatient rehab (3hours/day)    Assistance Recommended at Discharge Frequent or constant Supervision/Assistance  Patient can return home with the following  A little help with walking and/or transfers;A little help with bathing/dressing/bathroom;Assistance with cooking/housework;Assist for transportation;Help with stairs or ramp for entrance   Equipment Recommendations  None recommended by OT    Recommendations for Other Services      Precautions / Restrictions Precautions Precautions: Fall Required Braces or Orthoses: Other Brace Other Brace: residual limb protector Restrictions Weight Bearing Restrictions: Yes RLE Weight Bearing: Non weight bearing Other  Position/Activity Restrictions: Order set says TDWB but per prior note Dr [name] stated NWB would be best and pt reports NWB       Mobility Bed Mobility Overal bed mobility: Needs Assistance Bed Mobility: Sit to Supine       Sit to supine: Supervision   General bed mobility comments: able to get to supine and pull self up in bed    Transfers Overall transfer level: Needs assistance Equipment used: Rolling walker (2 wheels) Transfers: Sit to/from Stand Sit to Stand: Min assist     Step pivot transfers: Min assist     General transfer comment: performed toilet transfers x2 from EOB  with RW with cues for hand placement     Balance Overall balance assessment: Needs assistance Sitting-balance support: No upper extremity supported Sitting balance-Leahy Scale: Normal     Standing balance support: Bilateral upper extremity supported, During functional activity, Reliant on assistive device for balance Standing balance-Leahy Scale: Poor Standing balance comment: reliant on RW for support                           ADL either performed or assessed with clinical judgement   ADL Overall ADL's : Needs assistance/impaired                         Toilet Transfer: Minimal assistance;Ambulation;Rolling walker (2 wheels);BSC/3in1 Toilet Transfer Details (indicate cue type and reason): transferred to 3n1 over toilet with min assist Toileting- Clothing Manipulation and Hygiene: Minimal assistance;Sit to/from stand Toileting - Clothing Manipulation Details (indicate cue type and reason): min assist to complete       General ADL Comments: focused on toilet transfers    Extremity/Trunk Assessment  Vision       Perception     Praxis      Cognition Arousal/Alertness: Awake/alert Behavior During Therapy: WFL for tasks assessed/performed Overall Cognitive Status: Within Functional Limits for tasks assessed                                  General Comments: aware of safety and motivated towards therapy        Exercises Exercises: General Upper Extremity General Exercises - Upper Extremity Shoulder Flexion: Strengthening, Both, 10 reps, Seated, Theraband Theraband Level (Shoulder Flexion): Level 4 (Blue) Shoulder ABduction: Strengthening, Both, 10 reps, Seated, Theraband Theraband Level (Shoulder Abduction): Level 4 (Blue)    Shoulder Instructions       General Comments VSS on RA    Pertinent Vitals/ Pain       Pain Assessment Pain Assessment: Faces Faces Pain Scale: Hurts a little bit Pain Location: R residual limb surgical site; denies phantom pain Pain Descriptors / Indicators: Sore Pain Intervention(s): Monitored during session, Repositioned  Home Living                                          Prior Functioning/Environment              Frequency  Min 2X/week        Progress Toward Goals  OT Goals(current goals can now be found in the care plan section)  Progress towards OT goals: Progressing toward goals  Acute Rehab OT Goals Patient Stated Goal: go to AIR OT Goal Formulation: With patient/family Time For Goal Achievement: 05/24/22 Potential to Achieve Goals: Good ADL Goals Pt Will Perform Grooming: with modified independence;standing Pt Will Perform Lower Body Bathing: with modified independence;sit to/from stand;sitting/lateral leans Pt Will Perform Lower Body Dressing: with modified independence;sit to/from stand Pt Will Transfer to Toilet: with modified independence;ambulating;bedside commode Pt Will Perform Toileting - Clothing Manipulation and hygiene: with modified independence;sit to/from stand  Plan Discharge plan needs to be updated;Frequency remains appropriate    Co-evaluation                 AM-PAC OT "6 Clicks" Daily Activity     Outcome Measure   Help from another person eating meals?: None Help from another person taking care of  personal grooming?: A Little Help from another person toileting, which includes using toliet, bedpan, or urinal?: A Little Help from another person bathing (including washing, rinsing, drying)?: A Little Help from another person to put on and taking off regular upper body clothing?: A Little Help from another person to put on and taking off regular lower body clothing?: A Little 6 Click Score: 19    End of Session Equipment Utilized During Treatment: Gait belt;Rolling walker (2 wheels)  OT Visit Diagnosis: Unsteadiness on feet (R26.81);Pain Pain - Right/Left: Right Pain - part of body: Ankle and joints of foot   Activity Tolerance Patient tolerated treatment well   Patient Left in chair;with call bell/phone within reach;with family/visitor present   Nurse Communication Mobility status        Time: 8299-3716 OT Time Calculation (min): 49 min  Charges:    Lodema Hong, [place]  Office [phone]   Trixie Dredge 05/12/2022, 12:43 PM

## 2022-05-12 NOTE — Progress Notes (Signed)
Patient ID: Clinton [name], male   DOB: 21-Feb-1956, 66 y.o.   MRN: [id number] Patient is a 66 year old gentleman 3 days status post right below-knee amputation.  Patient is showing good progress with his therapy.  There is no drainage in the wound VAC canister.  Evaluation underway for inpatient rehab.  If insurance does not approve inpatient rehab we will plan for discharge to home with durable medical equipment and home health therapy.

## 2022-05-12 NOTE — Plan of Care (Signed)
  Problem: Education: Goal: Knowledge of the prescribed therapeutic regimen will improve Outcome: Progressing Goal: Ability to verbalize activity precautions or restrictions will improve Outcome: Progressing Goal: Understanding of discharge needs will improve Outcome: Progressing   Problem: Activity: Goal: Ability to perform//tolerate increased activity and mobilize with assistive devices will improve Outcome: Progressing   Problem: Clinical Measurements: Goal: Postoperative complications will be avoided or minimized Outcome: Progressing   Problem: Self-Care: Goal: Ability to meet self-care needs will improve Outcome: Progressing   Problem: Self-Concept: Goal: Ability to maintain and perform role responsibilities to the fullest extent possible will improve Outcome: Progressing   Problem: Pain Management: Goal: Pain level will decrease with appropriate interventions Outcome: Progressing   Problem: Skin Integrity: Goal: Demonstration of wound healing without infection will improve Outcome: Progressing   Problem: Education: Goal: Understanding of CV disease, CV risk reduction, and recovery process will improve Outcome: Progressing Goal: Individualized Educational Video(s) Outcome: Progressing   Problem: Activity: Goal: Ability to return to baseline activity level will improve Outcome: Progressing   Problem: Cardiovascular: Goal: Ability to achieve and maintain adequate cardiovascular perfusion will improve Outcome: Progressing Goal: Vascular access site(s) Level 0-1 will be maintained Outcome: Progressing   Problem: Health Behavior/Discharge Planning: Goal: Ability to safely manage health-related needs after discharge will improve Outcome: Progressing   Problem: Education: Goal: Knowledge of General Education information will improve Description: Including pain rating scale, medication(s)/side effects and non-pharmacologic comfort measures Outcome: Progressing    Problem: Health Behavior/Discharge Planning: Goal: Ability to manage health-related needs will improve Outcome: Progressing   Problem: Clinical Measurements: Goal: Ability to maintain clinical measurements within normal limits will improve Outcome: Progressing Goal: Will remain free from infection Outcome: Progressing Goal: Diagnostic test results will improve Outcome: Progressing Goal: Respiratory complications will improve Outcome: Progressing Goal: Cardiovascular complication will be avoided Outcome: Progressing   Problem: Activity: Goal: Risk for activity intolerance will decrease Outcome: Progressing   Problem: Nutrition: Goal: Adequate nutrition will be maintained Outcome: Progressing   Problem: Coping: Goal: Level of anxiety will decrease Outcome: Progressing   Problem: Elimination: Goal: Will not experience complications related to bowel motility Outcome: Progressing Goal: Will not experience complications related to urinary retention Outcome: Progressing   Problem: Pain Managment: Goal: General experience of comfort will improve Outcome: Progressing   Problem: Safety: Goal: Ability to remain free from injury will improve Outcome: Progressing   Problem: Skin Integrity: Goal: Risk for impaired skin integrity will decrease Outcome: Progressing

## 2022-05-12 NOTE — Progress Notes (Addendum)
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 PHMx: HTN, Bil LE ulcers, carotid artery occlusion, PAD, cellulitis    PT Comments    Pt making good progress and very motivated.  He has excellent knee extension at 0 degrees, but flexion is tight to only about 45 degrees.  Pt tolerated AROM and flexion stretches well with minimal pain.  Reports pain significantly better than toe amputation and denies phantom pain.  He ambulated 41' with RW hop to pattern but required several rest breaks in last half of walk.  Continue plan of care.  Provided HEP.     Recommendations for follow up therapy are one component of a multi-disciplinary discharge planning process, led by the attending physician.  Recommendations may be updated based on patient status, additional functional criteria and insurance authorization.  Follow Up Recommendations  Acute inpatient rehab (3hours/day)     Assistance Recommended at Discharge Frequent or constant Supervision/Assistance  Patient can return home with the following Assistance with cooking/housework;A little help with walking and/or transfers;A little help with bathing/dressing/bathroom;Assist for transportation;Help with stairs or ramp for entrance   Equipment Recommendations  Rolling walker (2 wheels)    Recommendations for Other Services       Precautions / Restrictions Precautions Precautions: Fall Required Braces or Orthoses: Other Brace Other Brace: residual limb protector Restrictions RLE Weight Bearing: Non weight bearing     Mobility  Bed Mobility Overal bed mobility: Needs Assistance Bed Mobility: Supine to Sit     Supine to sit: HOB elevated, Min guard     General bed mobility comments: Cues for pushing with UE     Transfers Overall transfer level: Needs assistance Equipment used: Rolling walker (2 wheels) Transfers: Sit to/from Stand Sit to Stand: Min assist           General transfer comment: Performed x 2 with light min A; cues for hand placement    Ambulation/Gait Ambulation/Gait assistance: Min assist Gait Distance (Feet): 70 Feet Assistive device: Rolling walker (2 wheels)   Gait velocity: decreased     General Gait Details: hop to on L LE with RW, vc for use of triceps, not shoulders to facilitate UE support with hops, multiple standing rest breaks required,   Stairs             Wheelchair Mobility    Modified Rankin (Stroke Patients Only)       Balance Overall balance assessment: Needs assistance Sitting-balance support: No upper extremity supported Sitting balance-Leahy Scale: Normal     Standing balance support: Bilateral upper extremity supported, During functional activity, Reliant on assistive device for balance Standing balance-Leahy Scale: Poor Standing balance comment: Reliant on AD and needing min guard for safety                            Cognition Arousal/Alertness: Awake/alert Behavior During Therapy: WFL for tasks assessed/performed Overall Cognitive Status: Within Functional Limits for tasks assessed                                          Exercises Amputee Exercises Quad Sets: AROM, Right, 10 reps, Supine Hip Extension: AROM, Right,  10 reps, Standing Hip ABduction/ADduction: AROM, Right, 10 reps, Supine Knee Flexion: AROM, Right, 10 reps, Seated (with manual stretch 5 sec hold each rep; tolerated well) Knee Extension: AROM, Right, 10 reps, Seated Straight Leg Raises: AROM, Right, 10 reps, Supine    General Comments General comments (skin integrity, edema, etc.): VSS on RA   Pt educated on importance of full knee extension of which his is excellent.  He has limb protector in place - educated that this  needs to stay to protect limb and promote extension but is ok to remove in bed for exercises.  Showed pt and wife how to don/doff.  Discussed that his flexion is tight and encouraged HEP 3 x day.  Pt also with wound vac and shrinker in place.  Showed how to move wound vac tubing to prevent wound from being compressed in same location all of the time.      Pertinent Vitals/Pain Pain Assessment Pain Assessment: 0-10 Pain Score: 1  Pain Location: R residual limb surgical site; denies phantom pain Pain Descriptors / Indicators: Sore Pain Intervention(s): Limited activity within patient's tolerance, Monitored during session    Home Living                          Prior Function            PT Goals (current goals can now be found in the care plan section) Progress towards PT goals: Progressing toward goals    Frequency    Min 3X/week      PT Plan Current plan remains appropriate    Co-evaluation              AM-PAC PT "6 Clicks" Mobility   Outcome Measure  Help needed turning from your back to your side while in a flat bed without using bedrails?: A Little Help needed moving from lying on your back to sitting on the side of a flat bed without using bedrails?: A Little Help needed moving to and from a bed to a chair (including a wheelchair)?: A Little Help needed standing up from a chair using your arms (e.g., wheelchair or bedside chair)?: A Little Help needed to walk in hospital room?: A Little Help needed climbing 3-5 steps with a railing? : Total 6 Click Score: 16    End of Session Equipment Utilized During Treatment: Gait belt Activity Tolerance: Patient tolerated treatment well Patient left: with call bell/phone within reach;in bed (EOB, OT going in) Nurse Communication: Mobility status PT Visit Diagnosis: Unsteadiness on feet (R26.81);Muscle weakness (generalized) (M62.81);Difficulty in walking, not elsewhere classified (R26.2)     Time:  4098-1191 PT Time Calculation (min) (ACUTE ONLY): 35 min  Charges:  $Gait Training: 8-22 mins $Therapeutic Exercise: 8-22 mins                     Anise Salvo, PT Acute Rehab [facility] [phone]    Rayetta Humphrey 05/12/2022, 12:33 PM

## 2022-05-13 ENCOUNTER — Telehealth: Payer: Self-pay | Admitting: Orthopedic Surgery

## 2022-05-13 NOTE — Telephone Encounter (Signed)
I called and sw pt and advised that per. Sharol Given they can pick up any protein powder from [place], [place], [employer] whatever they can find no particular type. Voiced understanding and will call with any other questions.

## 2022-05-13 NOTE — Progress Notes (Signed)
Mobility Specialist Progress Note   05/13/22 0946  Mobility  Activity Ambulated with assistance in room  Activity Response Tolerated well  Distance Ambulated (ft) 30 ft  Level of Assistance Contact guard assist, steadying assist  Assistive Device Front wheel walker  RLE Weight Bearing NWB  Range of Motion/Exercises Active;All extremities   Patient received in supine eager to participate in mobility. Stood with minimal HHA and ambulated with a "hop-to" gait at min guard. Required x5 standing rest breaks second to UE fatigue and wrist pain. Returned to EOB without complaint or incident. Was left in supine with all needs met, call bell in reach.   [place] Vander Kueker, [place], [place]  HNPMV:[phone] Office: [phone]

## 2022-05-13 NOTE — Progress Notes (Signed)
Inpatient Rehab Admissions Coordinator:    Spoke with patient at bedside and wife via phone. We are still waiting for insurance approval. Patient deciding if he just wants to go home tomorrow. Will continue to wait for insurance to respond and go from there. Will follow up when I hear from insurance co.   Rehab Admissons Coordinator [place], [place], [place] [phone]

## 2022-05-13 NOTE — Progress Notes (Signed)
Patient ID: Clinton [name], male   DOB: 1955-09-10, 66 y.o.   MRN: [id number] Patient is a 66 year old gentleman status post transtibial amputation.  Patient is making good progress with physical therapy there is no drainage in the wound VAC canister.  Anticipate patient could discharge to home tomorrow we will discontinue the wound VAC at time of discharge.

## 2022-05-13 NOTE — Telephone Encounter (Signed)
Patient's wife called asked what kind of protein powder should she get?  The number to contact Clinton [name] is [phone]

## 2022-05-14 ENCOUNTER — Inpatient Hospital Stay (HOSPITAL_COMMUNITY)
Admission: RE | Admit: 2022-05-14 | Discharge: 2022-05-17 | DRG: 560 | Disposition: A | Discharge status: Home / Self Care | Payer: 59 | Source: Intra-hospital | Attending: Physical Medicine & Rehabilitation | Admitting: Physical Medicine & Rehabilitation

## 2022-05-14 ENCOUNTER — Other Ambulatory Visit: Payer: Self-pay

## 2022-05-14 DIAGNOSIS — R7303 Prediabetes: Secondary | ICD-10-CM | POA: Diagnosis present

## 2022-05-14 DIAGNOSIS — I10 Essential (primary) hypertension: Secondary | ICD-10-CM | POA: Diagnosis present

## 2022-05-14 DIAGNOSIS — N529 Male erectile dysfunction, unspecified: Secondary | ICD-10-CM | POA: Diagnosis present

## 2022-05-14 DIAGNOSIS — Z882 Allergy status to sulfonamides status: Secondary | ICD-10-CM | POA: Diagnosis not present

## 2022-05-14 DIAGNOSIS — E785 Hyperlipidemia, unspecified: Secondary | ICD-10-CM | POA: Diagnosis present

## 2022-05-14 DIAGNOSIS — Z880 Allergy status to penicillin: Secondary | ICD-10-CM | POA: Diagnosis not present

## 2022-05-14 DIAGNOSIS — Z79899 Other long term (current) drug therapy: Secondary | ICD-10-CM

## 2022-05-14 DIAGNOSIS — I739 Peripheral vascular disease, unspecified: Secondary | ICD-10-CM | POA: Diagnosis present

## 2022-05-14 DIAGNOSIS — Z89511 Acquired absence of right leg below knee: Secondary | ICD-10-CM | POA: Diagnosis not present

## 2022-05-14 DIAGNOSIS — M62838 Other muscle spasm: Secondary | ICD-10-CM | POA: Diagnosis present

## 2022-05-14 DIAGNOSIS — Z23 Encounter for immunization: Secondary | ICD-10-CM | POA: Diagnosis not present

## 2022-05-14 DIAGNOSIS — Z8619 Personal history of other infectious and parasitic diseases: Secondary | ICD-10-CM | POA: Diagnosis not present

## 2022-05-14 DIAGNOSIS — Z833 Family history of diabetes mellitus: Secondary | ICD-10-CM | POA: Diagnosis not present

## 2022-05-14 DIAGNOSIS — N179 Acute kidney failure, unspecified: Secondary | ICD-10-CM | POA: Diagnosis not present

## 2022-05-14 DIAGNOSIS — G8918 Other acute postprocedural pain: Secondary | ICD-10-CM

## 2022-05-14 DIAGNOSIS — D62 Acute posthemorrhagic anemia: Secondary | ICD-10-CM

## 2022-05-14 DIAGNOSIS — Z7982 Long term (current) use of aspirin: Secondary | ICD-10-CM

## 2022-05-14 DIAGNOSIS — Z83438 Family history of other disorder of lipoprotein metabolism and other lipidemia: Secondary | ICD-10-CM | POA: Diagnosis not present

## 2022-05-14 DIAGNOSIS — Z87891 Personal history of nicotine dependence: Secondary | ICD-10-CM

## 2022-05-14 DIAGNOSIS — Z8249 Family history of ischemic heart disease and other diseases of the circulatory system: Secondary | ICD-10-CM

## 2022-05-14 DIAGNOSIS — Z4781 Encounter for orthopedic aftercare following surgical amputation: Principal | ICD-10-CM

## 2022-05-14 DIAGNOSIS — Z7902 Long term (current) use of antithrombotics/antiplatelets: Secondary | ICD-10-CM

## 2022-05-14 DIAGNOSIS — R770 Abnormality of albumin: Secondary | ICD-10-CM | POA: Diagnosis not present

## 2022-05-14 DIAGNOSIS — S88111A Complete traumatic amputation at level between knee and ankle, right lower leg, initial encounter: Secondary | ICD-10-CM

## 2022-05-14 MED ORDER — ENOXAPARIN SODIUM 40 MG/0.4ML IJ SOSY
40.0000 mg | PREFILLED_SYRINGE | INTRAMUSCULAR | Status: DC
  Administered 2022-05-14 – 2022-05-16 (×3): 40 mg via SUBCUTANEOUS
  Filled 2022-05-14 (×3): qty 0.4

## 2022-05-14 MED ORDER — IRBESARTAN 300 MG PO TABS
300.0000 mg | ORAL_TABLET | Freq: Every day | ORAL | Status: DC
  Administered 2022-05-14 – 2022-05-16 (×3): 300 mg via ORAL
  Filled 2022-05-14 (×3): qty 1

## 2022-05-14 MED ORDER — DOCUSATE SODIUM 100 MG PO CAPS
100.0000 mg | ORAL_CAPSULE | Freq: Every day | ORAL | Status: DC
  Administered 2022-05-15 – 2022-05-17 (×3): 100 mg via ORAL
  Filled 2022-05-14 (×4): qty 1

## 2022-05-14 MED ORDER — BISACODYL 10 MG RE SUPP: 10.0000 mg | Freq: Every day | RECTAL | Status: DC | PRN

## 2022-05-14 MED ORDER — CLOPIDOGREL BISULFATE 75 MG PO TABS
75.0000 mg | ORAL_TABLET | Freq: Every day | ORAL | Status: DC
  Administered 2022-05-14 – 2022-05-16 (×3): 75 mg via ORAL
  Filled 2022-05-14 (×3): qty 1

## 2022-05-14 MED ORDER — JUVEN PO PACK
1.0000 | PACK | Freq: Two times a day (BID) | ORAL | Status: DC
  Administered 2022-05-14 – 2022-05-16 (×5): 1 via ORAL
  Filled 2022-05-14 (×5): qty 1

## 2022-05-14 MED ORDER — INFLUENZA VAC A&B SA ADJ QUAD 0.5 ML IM PRSY
0.5000 mL | PREFILLED_SYRINGE | INTRAMUSCULAR | Status: AC
  Administered 2022-05-15: 0.5 mL via INTRAMUSCULAR
  Filled 2022-05-14: qty 0.5

## 2022-05-14 MED ORDER — OXYCODONE HCL 5 MG PO TABS
10.0000 mg | ORAL_TABLET | ORAL | Status: DC | PRN
  Administered 2022-05-14 – 2022-05-16 (×10): 10 mg via ORAL
  Filled 2022-05-14 (×10): qty 2

## 2022-05-14 MED ORDER — POLYETHYLENE GLYCOL 3350 17 G PO PACK: 17.0000 g | PACK | Freq: Every day | ORAL | Status: DC | PRN

## 2022-05-14 MED ORDER — ATORVASTATIN CALCIUM 40 MG PO TABS
40.0000 mg | ORAL_TABLET | Freq: Every day | ORAL | Status: DC
  Administered 2022-05-14 – 2022-05-16 (×3): 40 mg via ORAL
  Filled 2022-05-14 (×3): qty 1

## 2022-05-14 MED ORDER — ZINC SULFATE 220 (50 ZN) MG PO CAPS
220.0000 mg | ORAL_CAPSULE | Freq: Every day | ORAL | Status: DC
  Administered 2022-05-15 – 2022-05-17 (×3): 220 mg via ORAL
  Filled 2022-05-14 (×4): qty 1

## 2022-05-14 MED ORDER — ACETAMINOPHEN 325 MG PO TABS: 325.0000 mg | ORAL_TABLET | ORAL | Status: DC | PRN

## 2022-05-14 MED ORDER — ASPIRIN 81 MG PO TBEC
81.0000 mg | DELAYED_RELEASE_TABLET | Freq: Every day | ORAL | Status: DC
  Administered 2022-05-14 – 2022-05-16 (×3): 81 mg via ORAL
  Filled 2022-05-14 (×3): qty 1

## 2022-05-14 MED ORDER — GUAIFENESIN-DM 100-10 MG/5ML PO SYRP: 5.0000 mL | ORAL_SOLUTION | Freq: Four times a day (QID) | ORAL | Status: DC | PRN

## 2022-05-14 MED ORDER — DIPHENHYDRAMINE HCL 12.5 MG/5ML PO ELIX: 12.5000 mg | ORAL_SOLUTION | Freq: Four times a day (QID) | ORAL | Status: DC | PRN

## 2022-05-14 MED ORDER — PANTOPRAZOLE SODIUM 40 MG PO TBEC
40.0000 mg | DELAYED_RELEASE_TABLET | Freq: Every day | ORAL | Status: DC
  Administered 2022-05-15 – 2022-05-17 (×3): 40 mg via ORAL
  Filled 2022-05-14 (×4): qty 1

## 2022-05-14 MED ORDER — AMLODIPINE BESYLATE 2.5 MG PO TABS
2.5000 mg | ORAL_TABLET | Freq: Every day | ORAL | Status: DC
  Administered 2022-05-14 – 2022-05-16 (×3): 2.5 mg via ORAL
  Filled 2022-05-14 (×3): qty 1

## 2022-05-14 MED ORDER — ALUM & MAG HYDROXIDE-SIMETH 200-200-20 MG/5ML PO SUSP: 15.0000 mL | ORAL | Status: DC | PRN

## 2022-05-14 MED ORDER — FLEET ENEMA 7-19 GM/118ML RE ENEM: 1.0000 | ENEMA | Freq: Once | RECTAL | Status: DC | PRN

## 2022-05-14 MED ORDER — HYDROCHLOROTHIAZIDE 12.5 MG PO TABS
12.5000 mg | ORAL_TABLET | Freq: Every day | ORAL | Status: DC
  Administered 2022-05-14 – 2022-05-16 (×3): 12.5 mg via ORAL
  Filled 2022-05-14 (×3): qty 1

## 2022-05-14 MED ORDER — VITAMIN C 500 MG PO TABS
1000.0000 mg | ORAL_TABLET | Freq: Every day | ORAL | Status: DC
  Administered 2022-05-15 – 2022-05-17 (×3): 1000 mg via ORAL
  Filled 2022-05-14 (×5): qty 2

## 2022-05-14 MED ORDER — PROCHLORPERAZINE EDISYLATE 10 MG/2ML IJ SOLN: 5.0000 mg | Freq: Four times a day (QID) | INTRAMUSCULAR | Status: DC | PRN

## 2022-05-14 MED ORDER — TRAZODONE HCL 50 MG PO TABS: 25.0000 mg | ORAL_TABLET | Freq: Every evening | ORAL | Status: DC | PRN

## 2022-05-14 MED ORDER — PROCHLORPERAZINE 25 MG RE SUPP: 12.5000 mg | Freq: Four times a day (QID) | RECTAL | Status: DC | PRN

## 2022-05-14 MED ORDER — ALUM & MAG HYDROXIDE-SIMETH 200-200-20 MG/5ML PO SUSP: 30.0000 mL | ORAL | Status: DC | PRN

## 2022-05-14 MED ORDER — PROCHLORPERAZINE MALEATE 5 MG PO TABS: 5.0000 mg | ORAL_TABLET | Freq: Four times a day (QID) | ORAL | Status: DC | PRN

## 2022-05-14 NOTE — Progress Notes (Signed)
PMR Admission Coordinator Pre-Admission Assessment   Patient: Clinton [name] is an 66 y.o., male MRN: [id number] DOB: 02-16-1956 Height: '5\' 10"'  (177.8 cm) Weight: 95.3 kg   Insurance Information   PRIMARY: [employer] Choice plus commercial      Policy#: [id number]      Subscriber: patient Phone#: [email or web address]     Fax#: [phone] Pre-Cert#: [id number]   67/1/24 +6  (7 days to admit)  Employer: [employer] Benefits:  Phone #: [phone]     CM Name: Deirdre Pippins [phone] x [phone], Fax [phone] Eff. Date: 08/11/21     Deduct: $2,000 ($2,000 met)      Out of Pocket Max: $4,000 ( $4,000 met)       CIR: 90% coverage 10% co-insurance      SNF: 90% coverage, 10% co-insurance, limited to 60 days/cal year Outpatient: 90% coverage 10% coinsurance, limited to 20 visits /cal year      Home Health: 90% coverage, 10% co-insurance, limited to 60 visits/cal year      DME: 90% coverage 10% co-insurance, 1 type of DME every 3 years     Providers: in network    The "Data Collection Information Summary" for patients in Inpatient Rehabilitation Facilities with attached "Privacy Act [place] Records" was provided and verbally reviewed with: N/A   Emergency Contact Information Contact Information       Name Relation Home Work Mobile    [place] Spouse [phone]   [phone]           Current Medical History  Patient Admitting Diagnosis: R BKA   History of Present Illness: A 66 year old gentleman who presents status post revascularization to the right lower extremity and status post right fifth ray amputation.  Patient has just followed up with vascular surgery and arterial duplex was obtained.  Patient was advised there were no further revascularization options.  Patient complains of odor pain and black tissue changes.  Patient underwent R BKA with VAC placement on 05/09/22 by Dr. Sharol Given.  PT/OT evaluations completed with recommendations for acute inpatient rehab  admission.   Patient's medical record from [facility] has been reviewed by the rehabilitation admission coordinator and physician.   Past Medical History      Past Medical History:  Diagnosis Date   Carotid artery occlusion     Cellulitis     Erectile dysfunction     Hyperlipidemia     Hypertension     Peripheral vascular disease ([place])        Has the patient had major surgery during 100 days prior to admission? Yes   Family History   family history includes Deep vein thrombosis in his father and mother; Diabetes in his father; Heart attack in his father and mother; Heart disease in his father and mother; Hyperlipidemia in his brother and father; Hypertension in his brother and father; Other in his mother; Peripheral vascular disease in his father.   Current Medications   Current Facility-Administered Medications:    0.9 %  sodium chloride infusion, , Intravenous, Continuous, Newt Minion, MD, Last Rate: 75 mL/hr at 05/09/22 2215, Restarted at 05/09/22 2215   acetaminophen (TYLENOL) tablet 325-650 mg, 325-650 mg, Oral, Q6H PRN, Newt Minion, MD, 650 mg at 05/14/22 0631   alum & mag hydroxide-simeth (MAALOX/MYLANTA) 200-200-20 MG/5ML suspension 15-30 mL, 15-30 mL, Oral, Q2H PRN, Newt Minion, MD   amLODipine (NORVASC) tablet 2.5 mg, 2.5 mg, Oral, QHS, Newt Minion, MD, 2.5 mg at 05/13/22 2145   ascorbic acid (  VITAMIN C) tablet 1,000 mg, 1,000 mg, Oral, Daily, Newt Minion, MD, 1,000 mg at 05/14/22 0815   aspirin EC tablet 81 mg, 81 mg, Oral, QHS, Newt Minion, MD, 81 mg at 05/13/22 2145   atorvastatin (LIPITOR) tablet 40 mg, 40 mg, Oral, QHS, Newt Minion, MD, 40 mg at 05/13/22 2145   bisacodyl (DULCOLAX) EC tablet 5 mg, 5 mg, Oral, Daily PRN, Newt Minion, MD   clopidogrel (PLAVIX) tablet 75 mg, 75 mg, Oral, QHS, Newt Minion, MD, 75 mg at 05/13/22 2146   docusate sodium (COLACE) capsule 100 mg, 100 mg, Oral, Daily, Newt Minion, MD, 100 mg at 05/13/22 7591    guaiFENesin-dextromethorphan (ROBITUSSIN DM) 100-10 MG/5ML syrup 15 mL, 15 mL, Oral, Q4H PRN, Newt Minion, MD   hydrALAZINE (APRESOLINE) injection 5 mg, 5 mg, Intravenous, Q20 Min PRN, Newt Minion, MD   irbesartan (AVAPRO) tablet 300 mg, 300 mg, Oral, QHS, 300 mg at 05/13/22 2145 **AND** hydrochlorothiazide (HYDRODIURIL) tablet 12.5 mg, 12.5 mg, Oral, QHS, Newt Minion, MD, 12.5 mg at 05/13/22 2145   HYDROmorphone (DILAUDID) injection 0.5-1 mg, 0.5-1 mg, Intravenous, Q4H PRN, Newt Minion, MD, 1 mg at 05/10/22 1316   labetalol (NORMODYNE) injection 10 mg, 10 mg, Intravenous, Q10 min PRN, Newt Minion, MD   magnesium citrate solution 1 Bottle, 1 Bottle, Oral, Once PRN, Newt Minion, MD   magnesium sulfate IVPB 2 g 50 mL, 2 g, Intravenous, Daily PRN, Newt Minion, MD   metoprolol tartrate (LOPRESSOR) injection 2-5 mg, 2-5 mg, Intravenous, Q2H PRN, Newt Minion, MD   nutrition supplement (JUVEN) (JUVEN) powder packet 1 packet, 1 packet, Oral, BID BM, Newt Minion, MD, 1 packet at 05/14/22 0815   ondansetron [facility]) injection 4 mg, 4 mg, Intravenous, Q6H PRN, Newt Minion, MD   oxyCODONE (Oxy IR/ROXICODONE) immediate release tablet 10-15 mg, 10-15 mg, Oral, Q4H PRN, Newt Minion, MD, 15 mg at 05/13/22 1442   oxyCODONE (Oxy IR/ROXICODONE) immediate release tablet 5-10 mg, 5-10 mg, Oral, Q4H PRN, Newt Minion, MD, 5 mg at 05/14/22 6384   pantoprazole (PROTONIX) EC tablet 40 mg, 40 mg, Oral, Daily, Newt Minion, MD, 40 mg at 05/14/22 0815   phenol (CHLORASEPTIC) mouth spray 1 spray, 1 spray, Mouth/Throat, PRN, Newt Minion, MD   polyethylene glycol (MIRALAX / GLYCOLAX) packet 17 g, 17 g, Oral, Daily PRN, Newt Minion, MD   potassium chloride SA (KLOR-CON M) CR tablet 20-40 mEq, 20-40 mEq, Oral, Daily PRN, Newt Minion, MD   zinc sulfate capsule 220 mg, 220 mg, Oral, Daily, Newt Minion, MD, 220 mg at 05/14/22 0815   Patients Current Diet:  Diet Order                   Diet - low sodium heart healthy             Diet Carb Modified Fluid consistency: Thin; Room service appropriate? Yes  Diet effective now                         Precautions / Restrictions Precautions Precautions: Fall Other Brace: residual limb protector Restrictions Weight Bearing Restrictions: Yes RLE Weight Bearing: Non weight bearing Other Position/Activity Restrictions: Order set says TDWB but per prior note Dr Sharol Given stated NWB would be best and pt reports NWB    Has the patient had 2 or more falls or a  fall with injury in the past year? No   Prior Activity Level Limited Community (1-2x/wk): Went out 2-3 times a week for appointments.  Was driving.  Using crutches in the house, uses scooter as needed.   Prior Functional Level Self Care: Did the patient need help bathing, dressing, using the toilet or eating? Independent   Indoor Mobility: Did the patient need assistance with walking from room to room (with or without device)? Independent   Stairs: Did the patient need assistance with internal or external stairs (with or without device)? Independent   Functional Cognition: Did the patient need help planning regular tasks such as shopping or remembering to take medications? Independent   Patient Information Are you of Hispanic, Latino/a,or Spanish origin?: A. No, not of Hispanic, Latino/a, or Spanish origin What is your race?: A. White Do you need or want an interpreter to communicate with a doctor or health care staff?: 0. No   Patient's Response To:  Health Literacy and Transportation Is the patient able to respond to health literacy and transportation needs?: Yes Health Literacy - How often do you need to have someone help you when you read instructions, pamphlets, or other written material from your doctor or pharmacy?: Never In the past 12 months, has lack of transportation kept you from medical appointments or from getting medications?: No In the past 12 months,  has lack of transportation kept you from meetings, work, or from getting things needed for daily living?: No   Home Assistive Devices / [place] Devices/Equipment: Environmental consultant (specify type), Crutches Home Equipment: Rolling Walker (2 wheels), Crutches, BSC/3in1 (knee scooter)   Prior Device Use: Indicate devices/aids used by the patient prior to current illness, exacerbation or injury? Motorized wheelchair or scooter and crutches   Current Functional Level Cognition   Overall Cognitive Status: Within Functional Limits for tasks assessed Orientation Level: Oriented X4 General Comments: aware of safety and motivated towards therapy    Extremity Assessment (includes Sensation/Coordination)   Upper Extremity Assessment: Defer to OT evaluation  Lower Extremity Assessment: RLE deficits/detail RLE Deficits / Details: R BKA     ADLs   Overall ADL's : Needs assistance/impaired Eating/Feeding: Independent, Sitting Eating/Feeding Details (indicate cue type and reason): recliner or EOB Grooming: Set up, Sitting Grooming Details (indicate cue type and reason): recliner or EOB Upper Body Bathing: Set up, Sitting Upper Body Bathing Details (indicate cue type and reason): recliner or EOB Lower Body Bathing: Minimal assistance, Sit to/from stand Upper Body Dressing : Set up, Sitting Upper Body Dressing Details (indicate cue type and reason): recliner or EOB Lower Body Dressing: Minimal assistance, Sit to/from stand Toilet Transfer: Minimal assistance, Ambulation, Rolling walker (2 wheels), BSC/3in1 Toilet Transfer Details (indicate cue type and reason): transferred to 3n1 over toilet with min assist Toileting- Clothing Manipulation and Hygiene: Minimal assistance, Sit to/from stand Toileting - Clothing Manipulation Details (indicate cue type and reason): min assist to complete Tub/Shower Transfer Details (indicate cue type and reason): Spoke about using his 3n1 in his shower stall at  home once the surgeon has cleared him to get residual limb wet General ADL Comments: focused on toilet transfers     Mobility   Overal bed mobility: Needs Assistance Bed Mobility: Sit to Supine Supine to sit: HOB elevated, Min guard Sit to supine: Supervision General bed mobility comments: able to get to supine and pull self up in bed     Transfers   Overall transfer level: Needs assistance Equipment used: Rolling walker (2  wheels) Transfers: Sit to/from Stand Sit to Stand: Min assist Step pivot transfers: Min assist General transfer comment: performed toilet transfers x2 from EOB  with RW with cues for hand placement     Ambulation / Gait / Stairs / Wheelchair Mobility   Ambulation/Gait Ambulation/Gait assistance: Herbalist (Feet): 70 Feet Assistive device: Rolling walker (2 wheels) Gait Pattern/deviations: Step-to pattern General Gait Details: hop to on L LE with RW, vc for use of triceps, not shoulders to facilitate UE support with hops, multiple standing rest breaks required, Gait velocity: decreased Gait velocity interpretation: <1.31 ft/sec, indicative of household ambulator     Posture / Balance Balance Overall balance assessment: Needs assistance Sitting-balance support: No upper extremity supported Sitting balance-Leahy Scale: Normal Standing balance support: Bilateral upper extremity supported, During functional activity, Reliant on assistive device for balance Standing balance-Leahy Scale: Poor Standing balance comment: reliant on RW for support     Special needs/care consideration Wound Vac Yes in place to R BKA stump site, Skin Dressing and VAC in place post op R BKA, and Special service needs To follow up with Dr. Sharol Given 1 week after discharge from CIR.    Previous Home Environment (from acute therapy documentation) Living Arrangements: Spouse/significant other  Lives With: Spouse Available Help at Discharge: Family, Available 24 hours/day Type of  Home: House Home Layout: One level Home Access: Stairs to enter Entrance Stairs-Rails: Can reach both Entrance Stairs-Number of Steps: 2 Bathroom Shower/Tub: Optometrist: Yes Home Care Services: No   Discharge Living Setting Plans for Discharge Living Setting: Patient's home, House, Lives with (comment) (Lives with wife.) Type of Home at Discharge: House Discharge Home Layout: One level Discharge Home Access: Stairs to enter Entrance Stairs-Rails: Can reach both, Left, Right Entrance Stairs-Number of Steps: 2 at garage entry Discharge Bathroom Shower/Tub: Walk-in shower Discharge Bathroom Accessibility: Yes How Accessible: Accessible via walker Does the patient have any problems obtaining your medications?: No   Social/Family/Support Systems Patient Roles: Spouse Contact Information: Wife Anticipated Caregiver: Jamas Lav - wife - [phone] Ability/Limitations of Caregiver: Wife works PT 2 days a week from 78 to 1:30 on Th-Fri in October.  She can provide supervision and light assist for patient. Caregiver Availability: 24/7 (Mostly 24/7 assist.) Discharge Plan Discussed with Primary Caregiver: Yes Is Caregiver In Agreement with Plan?: Yes Does Caregiver/Family have Issues with Lodging/Transportation while Pt is in Rehab?: No   Goals Patient/Family Goal for Rehab: PT/OT supervision go mod I goals Expected length of stay: 7 days Pt/Family Agrees to Admission and willing to participate: Yes Program Orientation Provided & Reviewed with Pt/Caregiver Including Roles  & Responsibilities: Yes   Decrease burden of Care through IP rehab admission: N/A   Possible need for SNF placement upon discharge: Not anticipated   Patient Condition: I have reviewed medical records from [facility], spoken with CM, and patient, spouse, son, and family member. I met with patient at the bedside for inpatient rehabilitation assessment.  Patient  will benefit from ongoing PT and OT, can actively participate in 3 hours of therapy a day 5 days of the week, and can make measurable gains during the admission.  Patient will also benefit from the coordinated team approach during an Inpatient Acute Rehabilitation admission.  The patient will receive intensive therapy as well as Rehabilitation physician, nursing, social worker, and care management interventions.  Due to bladder management, bowel management, safety, skin/wound care, disease management, medication administration, pain management, and patient education the  patient requires 24 hour a day rehabilitation nursing.  The patient is currently min assist with mobility and basic ADLs.  Discharge setting and therapy post discharge at home with home health is anticipated.  Patient has agreed to participate in the Acute Inpatient Rehabilitation Program and will admit 05/14/22.   Preadmission Screen Completed By:  with updates by Amanda Cockayne, PT,  Retta Diones, 05/14/2022 10:00 AM ______________________________________________________________________   Discussed status with Dr. Jennye Boroughs on 05/14/22 at 0945 and received approval for admission today.   Admission Coordinator:  Retta Diones, RN, time 1001/Date 05/14/22    Assessment/Plan: Diagnosis:R BKA Does the need for close, 24 hr/day Medical supervision in concert with the patient's rehab needs make it unreasonable for this patient to be served in a less intensive setting? Yes Co-Morbidities requiring supervision/potential complications: PAD, HTN, HLD Due to bladder management, bowel management, safety, skin/wound care, disease management, medication administration, pain management, and patient education, does the patient require 24 hr/day rehab nursing? Yes Does the patient require coordinated care of a physician, rehab nurse, PT, OT, and SLP to address physical and functional deficits in the context of the above medical  diagnosis(es)? Yes Addressing deficits in the following areas: balance, endurance, locomotion, strength, transferring, bowel/bladder control, bathing, dressing, feeding, grooming, toileting, cognition, and psychosocial support Can the patient actively participate in an intensive therapy program of at least 3 hrs of therapy 5 days a week? Yes The potential for patient to make measurable gains while on inpatient rehab is excellent Anticipated functional outcomes upon discharge from inpatient rehab: modified independent and supervision PT, modified independent and supervision OT, n/a SLP Estimated rehab length of stay to reach the above functional goals is: 7 days         Anticipated discharge destination: Home 10. Overall Rehab/Functional Prognosis: excellent     MD Signature: Jennye Boroughs MD         Revision History                                                                        Note Details  Author Jennye Boroughs, MD File Time 05/14/2022 10:52 AM  Author Type Physician Status Signed  Last Editor Jennye Boroughs, MD Service CASE MANAGEMENT

## 2022-05-14 NOTE — H&P (Signed)
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 Sharol Given on 05/09/22. Post op to continues to have wound VAC in place till discharge from rehab. Follow up labs showed acute renal failure with rise in BUN/SCr to 40/1.55 as well as ABLA. Pain control improving but he continues to be limited by weakness and loss of limb. Reports occasional pain in residual limb and denies phantom pain. CIR recommended due to functional decline.      Review of Systems  Constitutional:  Negative for chills and fever.  HENT:  Negative for hearing loss or vision changes Respiratory:  Negative for sputum production.   Cardiovascular:  Negative for chest pain and palpitations.  Gastrointestinal:  Negative for abdominal pain and constipation.  Genitourinary:  Positive for frequency.  Musculoskeletal:  Negative for back pain, joint pain and neck pain.  Neurological:  Negative for dizziness, sensory change, speech change and headaches.  Psychiatric/Behavioral:  The patient is not nervous/anxious and does not have insomnia.        Past Medical History:  Diagnosis Date   Carotid artery occlusion    Cellulitis    Erectile dysfunction    Hyperlipidemia    Hypertension    Peripheral vascular disease ([place])     Past Surgical History:  Procedure Laterality Date   ABDOMINAL AORTOGRAM W/LOWER EXTREMITY N/A 02/04/2022   Procedure: ABDOMINAL AORTOGRAM W/LOWER EXTREMITY;  Surgeon: Serafina Mitchell, MD;  Location: [facility];  Service: Cardiovascular;  Laterality: N/A;   ABDOMINAL AORTOGRAM W/LOWER EXTREMITY N/A 02/18/2022   Procedure: ABDOMINAL AORTOGRAM W/LOWER EXTREMITY;  Surgeon: Serafina Mitchell, MD;  Location: [facility];  Service: Cardiovascular;  Laterality: N/A;   ABDOMINAL AORTOGRAM W/LOWER EXTREMITY N/A 04/15/2022   Procedure: ABDOMINAL AORTOGRAM W/LOWER EXTREMITY;  Surgeon: Serafina Mitchell, MD;  Location: [facility];  Service: Cardiovascular;  Laterality: N/A;   AMPUTATION Right 04/16/2022   Procedure: RIGHT 5TH RAY AMPUTATION;  Surgeon: Newt Minion, MD;  Location: [place];  Service: Orthopedics;  Laterality: Right;   AMPUTATION Right 05/09/2022   Procedure: RIGHT BELOW KNEE AMPUTATION;  Surgeon: Newt Minion, MD;  Location: [place];  Service: Orthopedics;  Laterality: Right;   COLONOSCOPY     ENDARTERECTOMY Right 09/05/2015   Procedure: ENDARTERECTOMY CAROTID RIGHT;  Surgeon: Elam Dutch, MD;  Location: [facility];  Service: Vascular;  Laterality: Right;   PATCH ANGIOPLASTY  09/05/2015   Procedure: PATCH ANGIOPLASTY RIGHT CAROTID ARTERY USING HEMASHIELD PLATINUM FINESSE PATCH;  Surgeon: Elam Dutch, MD;  Location: [place];  Service: Vascular;;   PERIPHERAL VASCULAR ATHERECTOMY Left 02/18/2022   Procedure: PERIPHERAL VASCULAR ATHERECTOMY;  Surgeon: Serafina Mitchell, MD;  Location: [place] [facility];  Service: Cardiovascular;  Laterality: Left;  Popliteal and Peroneal   PERIPHERAL VASCULAR BALLOON ANGIOPLASTY  02/04/2022   Procedure: PERIPHERAL VASCULAR BALLOON ANGIOPLASTY;  Surgeon: Serafina Mitchell, MD;  Location: [place] [facility];  Service: Cardiovascular;;   PERIPHERAL VASCULAR BALLOON ANGIOPLASTY Right 04/15/2022   Procedure: PERIPHERAL VASCULAR BALLOON ANGIOPLASTY;  Surgeon: Serafina Mitchell, MD;  Location: [place] [facility];  Service: Cardiovascular;  Laterality: Right;  SFA/POPLITEAL   TONSILLECTOMY  VASECTOMY      Family History  Problem Relation Age of Onset   Other Mother        circulatory problems   Heart attack Mother    Deep vein thrombosis Mother    Heart disease Mother    Hyperlipidemia Father    Hypertension Father    Diabetes Father    Deep vein  thrombosis Father    Heart disease Father        before age 52   Heart attack Father    Peripheral vascular disease Father    Hypertension Brother    Hyperlipidemia Brother     Social History:  Married. Works in Location manager. He reports that he has never smoked. He quit smokeless tobacco use about 7 years ago.  His smokeless tobacco use included chew for 6-7 years.  He reports that he does not currently use alcohol. He reports that he does not use drugs.   Allergies  Allergen Reactions   Penicillins Rash    Has patient had a PCN reaction causing immediate rash, facial/tongue/throat swelling, SOB or lightheadedness with hypotension: Yes Has patient had a PCN reaction causing severe rash involving mucus membranes or skin necrosis: No Has patient had a PCN reaction that required hospitalization No Has patient had a PCN reaction occurring within the last 10 years: No If all of the above answers are "NO", then may proceed with Cephalosporin use.    Sulfa Antibiotics Swelling and Other (See Comments)    Facial/lip swelling    Medications Prior to Admission  Medication Sig Dispense Refill   acetaminophen (TYLENOL) 500 MG tablet Take 1,000 mg by mouth daily as needed for mild pain or headache.     amLODipine (NORVASC) 2.5 MG tablet Take 2.5 mg by mouth at bedtime.     aspirin EC 81 MG tablet Take 81 mg by mouth at bedtime.     atorvastatin (LIPITOR) 40 MG tablet Take 40 mg by mouth at bedtime.     clopidogrel (PLAVIX) 75 MG tablet Take 1 tablet (75 mg total) by mouth daily. (Patient taking differently: Take 75 mg by mouth at bedtime.) 30 tablet 11   irbesartan-hydrochlorothiazide (AVALIDE) 300-12.5 MG tablet Take 1 tablet by mouth at bedtime.     Multiple Vitamin (MULTIVITAMIN) capsule Take 2 capsules by mouth at bedtime. Gummy     sildenafil (VIAGRA) 100 MG tablet Take 100 mg by mouth as needed for erectile dysfunction.      Home: Home Living Family/patient expects to be discharged to::  Private residence Living Arrangements: Spouse/significant other Available Help at Discharge: Family, Available 24 hours/day Type of Home: House Home Access: Stairs to enter [employer] of Steps: 2 Entrance Stairs-Rails: Can reach both Home Layout: One level Bathroom Shower/Tub: Engineer, manufacturing systems: Standard Bathroom Accessibility: Yes Home Equipment: Agricultural consultant (2 wheels), Crutches, BSC/3in1 (knee scooter)  Lives With: Spouse   Functional History: Prior Function Prior Level of Function : Independent/Modified Independent Mobility Comments: using crutches to mobilize, up and down steps, RW to get into shower, knee scooter, crutches in and out of bathroom ADLs Comments: wife changes dressing, pt independent with bathing and dressing, working from home   Functional Status:  Mobility: Bed Mobility Overal bed mobility: Needs Assistance Bed Mobility: Sit to Supine Supine to sit: HOB elevated, Min guard Sit to supine: Supervision General bed mobility comments: able to get to supine and pull self up in bed Transfers Overall transfer level: Needs assistance Equipment used: Rolling walker (2  wheels) Transfers: Sit to/from Stand Sit to Stand: Min assist Step pivot transfers: Min assist General transfer comment: performed toilet transfers x2 from EOB  with RW with cues for hand placement Ambulation/Gait Ambulation/Gait assistance: Min assist Gait Distance (Feet): 70 Feet Assistive device: Rolling walker (2 wheels) Gait Pattern/deviations: Step-to pattern General Gait Details: hop to on L LE with RW, vc for use of triceps, not shoulders to facilitate UE support with hops, multiple standing rest breaks required, Gait velocity: decreased Gait velocity interpretation: <1.31 ft/sec, indicative of household ambulator   ADL: ADL Overall ADL's : Needs assistance/impaired Eating/Feeding: Independent, Sitting Eating/Feeding Details (indicate cue type and reason):  recliner or EOB Grooming: Set up, Sitting Grooming Details (indicate cue type and reason): recliner or EOB Upper Body Bathing: Set up, Sitting Upper Body Bathing Details (indicate cue type and reason): recliner or EOB Lower Body Bathing: Minimal assistance, Sit to/from stand Upper Body Dressing : Set up, Sitting Upper Body Dressing Details (indicate cue type and reason): recliner or EOB Lower Body Dressing: Minimal assistance, Sit to/from stand Toilet Transfer: Minimal assistance, Ambulation, Rolling walker (2 wheels), BSC/3in1 Toilet Transfer Details (indicate cue type and reason): transferred to 3n1 over toilet with min assist Toileting- Clothing Manipulation and Hygiene: Minimal assistance, Sit to/from stand Toileting - Clothing Manipulation Details (indicate cue type and reason): min assist to complete Tub/Shower Transfer Details (indicate cue type and reason): Spoke about using his 3n1 in his shower stall at home once the surgeon has cleared him to get residual limb wet General ADL Comments: focused on toilet transfers   Cognition: Cognition Overall Cognitive Status: Within Functional Limits for tasks assessed Orientation Level: Oriented X4 Cognition Arousal/Alertness: Awake/alert Behavior During Therapy: WFL for tasks assessed/performed Overall Cognitive Status: Within Functional Limits for tasks assessed Area of Impairment: Problem solving Problem Solving: Slow processing General Comments: aware of safety and motivated towards therapy     Blood pressure (!) 147/38, pulse (!) 47, temperature 98.6 F (37 C), temperature source Oral, resp. rate 17, height 5\' 10"  (1.778 m), weight 95.3 kg, SpO2 100 %. Physical Exam    General: Alert and oriented x 4, No apparent distress HEENT: Head is normocephalic, atraumatic, PERRLA, EOMI, sclera anicteric, oral mucosa pink and moist, dentition intact, ext ear canals clear, wearing glasses Neck: Supple without JVD or lymphadenopathy Heart:  Reg rate and rhythm. No murmurs rubs or gallops Chest: CTA bilaterally without wheezes, rales, or rhonchi; no distress Abdomen: Soft, non-tender, non-distended, bowel sounds positive. Extremities: No clubbing, cyanosis, or edema. Pulses are 2+ Psych: Pt's affect is appropriate. Pt is cooperative, pleasant Skin: Clean and intact without signs of breakdown Neuro:  follows commands, CN 2-12 intact, normal insight and judgement Strength 5/5 in b/l UE and RLE Strength 4+/5 L hip flexion Sensation intact to LT in all 4 extremities Musculoskeletal: R BKA with wound vac in place, no drainage in vac canister, BKA limb protector in place, no ROM deficits noted, normal muscle bulk IV in R dorsal hand was removed recently      Lab Results Last 48 Hours  No results found for this or any previous visit (from the past 48 hour(s)).   Imaging Results (Last 48 hours)  No results found.       Blood pressure (!) 120/55, pulse 83, temperature 98.2 F (36.8 C), temperature source Oral, resp. rate 18, height 5\' 10"  (1.778 m), weight 88.7 kg, SpO2 98 %.  Medical Problem List and Plan: 1. Functional deficits secondary to Right BKA  -  patient may not shower  -ELOS/Goals: 7 days, PT/OT supervision to mod I goals  -Admit to CIR 2.  Antithrombotics: -DVT/anticoagulation:  Pharmaceutical: Lovenox             -antiplatelet therapy: ASA and Plavix 3. Pain Management:  Oxycodone 10-15mg  prn.  4. Mood/Behavior/Sleep: LCSW to follow for evaluation and support.              -antipsychotic agents: N/A 5. Neuropsych/cognition: This patient is capable of making decisions on his own behalf. 6. Skin/Wound Care: Continue wound VAC till discharge-->can change over to [facility] if available.  7. Fluids/Electrolytes/Nutrition: Encourage fluid intake.  8. Acute renal failure: BUN/SCr 21/1.1-->40/1.55.  --Had dye study last month but question hypoperfusion.  -Recheck CMP tomorrow 9. ABLA, last HGB 9.3, recheck  tomorrow 10. HTN. Continue Irbesartan 300mg , HCTZ 12.5 mg and Amlodipine 2.5mg  11. HLD. Continue Lipitor     , PA-C 05/14/2022   I have personally performed a face to face diagnostic evaluation of this patient and formulated the key components of the plan.  Additionally, I have personally reviewed laboratory data, imaging studies, as well as relevant notes and concur with the physician assistant's documentation above.  The patient's status has not changed from the original H&P.  Any changes in documentation from the acute care chart have been noted above.  07/14/2022, MD, Fanny Dance

## 2022-05-14 NOTE — Progress Notes (Signed)
Patient ID: Clinton [name], male   DOB: Feb 17, 1956, 66 y.o.   MRN: [id number] Patient without complaints this morning.  Plan for discharge to inpatient rehab today.  Continue the wound VAC pump in rehab.  At discharge from rehab remove the wound VAC dressing and apply the stump shrinker.  I will follow-up in the office 1 week after discharge from inpatient rehab.

## 2022-05-14 NOTE — Progress Notes (Signed)
IP rehab admissions - I have approval for inpatient rehab admission from [facility] insurance.  Bed available on CIR today.  I spoke with patient and his wife and they are agreeable to CIR.  Will admit to inpatient rehab today.  Call me for questions.  [phone]

## 2022-05-14 NOTE — Progress Notes (Signed)
Physical Therapy Treatment Patient Details Name: Clinton [name] MRN: [id number] DOB: 31-Jul-1956 Today's Date: 05/14/2022   History of Present Illness Pt is a 66 yo male admitted 05/09/22 with painful gangrenous ulceration right foot and now s/p R BKA 05/09/22. PHMx: HTN, Bil LE ulcers, carotid artery occlusion, PAD, cellulitis    PT Comments    Pt has bed at [facility] today.  He demonstrates continued improvement, good pain control, and excellent motivation.  Pt ambulated 62' with fewer rest breaks today.  Knee extension ROM is excellent at 0 degrees.  Flexion is tight but improved to ~80 degrees.  Continue plan of care.     Recommendations for follow up therapy are one component of a multi-disciplinary discharge planning process, led by the attending physician.  Recommendations may be updated based on patient status, additional functional criteria and insurance authorization.  Follow Up Recommendations  Acute inpatient rehab (3hours/day)     Assistance Recommended at Discharge Frequent or constant Supervision/Assistance  Patient can return home with the following Assistance with cooking/housework;A little help with walking and/or transfers;A little help with bathing/dressing/bathroom;Assist for transportation;Help with stairs or ramp for entrance   Equipment Recommendations  Rolling walker (2 wheels)    Recommendations for Other Services Rehab consult     Precautions / Restrictions Precautions Precautions: Fall Other Brace: residual limb protector Restrictions Weight Bearing Restrictions: Yes RLE Weight Bearing: Non weight bearing     Mobility  Bed Mobility               General bed mobility comments: up in chiar    Transfers Overall transfer level: Needs assistance Equipment used: Rolling walker (2 wheels) Transfers: Sit to/from Stand Sit to Stand: Min assist           General transfer comment: cues for hand placement; min A to steady     Ambulation/Gait Ambulation/Gait assistance: Min Web designer (Feet): 60 Feet Assistive device: Rolling walker (2 wheels) Gait Pattern/deviations: Step-to pattern Gait velocity: decreased     General Gait Details: hop to on L LE with RW, vc for use of triceps, not shoulders to facilitate UE support with hops, 2 standing rest breaks required,   Stairs             Wheelchair Mobility    Modified Rankin (Stroke Patients Only)       Balance Overall balance assessment: Needs assistance Sitting-balance support: No upper extremity supported Sitting balance-Leahy Scale: Normal     Standing balance support: Bilateral upper extremity supported, During functional activity, Reliant on assistive device for balance Standing balance-Leahy Scale: Poor Standing balance comment: reliant on RW for support (but able to balance on L leg with min guard)                            Cognition Arousal/Alertness: Awake/alert Behavior During Therapy: WFL for tasks assessed/performed Overall Cognitive Status: Within Functional Limits for tasks assessed                                          Exercises Amputee Exercises Quad Sets: AROM, Right, 10 reps, Supine Towel Squeeze: AROM, Both, 10 reps, Supine Hip Extension: AROM, Right, 10 reps, Standing Hip ABduction/ADduction: AROM, Right, 10 reps, Supine Knee Flexion: AROM, Right, 10 reps, Seated (with stretch 5 sec hold) Knee Extension: AROM, Right, 10 reps, Seated  Straight Leg Raises: AROM, Right, 10 reps, Supine    General Comments General comments (skin integrity, edema, etc.): VSS      Pertinent Vitals/Pain Pain Assessment Pain Assessment: 0-10 Pain Score: 2  Pain Location: R residual limb surgical site; denies phantom pain Pain Descriptors / Indicators: Sore Pain Intervention(s): Monitored during session    Home Living                          Prior Function            PT  Goals (current goals can now be found in the care plan section) Progress towards PT goals: Progressing toward goals    Frequency    Min 3X/week      PT Plan Current plan remains appropriate    Co-evaluation              AM-PAC PT "6 Clicks" Mobility   Outcome Measure  Help needed turning from your back to your side while in a flat bed without using bedrails?: A Little Help needed moving from lying on your back to sitting on the side of a flat bed without using bedrails?: A Little Help needed moving to and from a bed to a chair (including a wheelchair)?: A Little Help needed standing up from a chair using your arms (e.g., wheelchair or bedside chair)?: A Little Help needed to walk in hospital room?: A Little Help needed climbing 3-5 steps with a railing? : Total 6 Click Score: 16    End of Session Equipment Utilized During Treatment: Gait belt Activity Tolerance: Patient tolerated treatment well Patient left: in chair;with call bell/phone within reach;with family/visitor present Nurse Communication: Mobility status PT Visit Diagnosis: Unsteadiness on feet (R26.81);Muscle weakness (generalized) (M62.81);Difficulty in walking, not elsewhere classified (R26.2)     Time: 1102-1130 PT Time Calculation (min) (ACUTE ONLY): 28 min  Charges:  $Gait Training: 8-22 mins $Therapeutic Exercise: 8-22 mins                     Abran Richard, PT Acute Rehab [facility] Rehab [phone]    Karlton Lemon 05/14/2022, 11:59 AM

## 2022-05-14 NOTE — Discharge Summary (Signed)
Discharge Diagnoses:  Principal Problem:   Below-knee amputation of right lower extremity ([place]) Active Problems:   Gangrene of right foot [facility])   Surgeries: Procedure(s): RIGHT BELOW KNEE AMPUTATION on 05/09/2022    Consultants:   Discharged Condition: Improved  Hospital Course: DARYAN BUELL is an 66 y.o. male who was admitted 05/09/2022 with a chief complaint of gangrene right foot, with a final diagnosis of Gangrene Right Foot.  Patient was brought to the operating room on 05/09/2022 and underwent Procedure(s): RIGHT BELOW KNEE AMPUTATION.    Patient was given perioperative antibiotics:  Anti-infectives (From admission, onward)    Start     Dose/Rate Route Frequency Ordered Stop   05/09/22 2200  ceFAZolin (ANCEF) IVPB 2g/100 mL premix        2 g 200 mL/hr over 30 Minutes Intravenous Every 8 hours 05/09/22 2106 05/10/22 0603   05/09/22 1015  vancomycin (VANCOCIN) IVPB 1000 mg/200 mL premix        1,000 mg 200 mL/hr over 60 Minutes Intravenous On call to O.R. 05/09/22 1008 05/09/22 2107     .  Patient was given sequential compression devices, early ambulation, and aspirin for DVT prophylaxis.  Recent vital signs: Patient Vitals for the past 24 hrs:  BP Temp Temp src Pulse SpO2  05/13/22 2018 (!) 138/43 -- -- (!) 45 98 %  05/13/22 1446 (!) 126/59 -- -- 83 98 %  05/13/22 1444 102/65 97.9 F (36.6 C) Oral 84 99 %  .  Recent laboratory studies: No results found.  Discharge Medications:   Allergies as of 05/14/2022       Reactions   Penicillins Rash   Has patient had a PCN reaction causing immediate rash, facial/tongue/throat swelling, SOB or lightheadedness with hypotension: Yes Has patient had a PCN reaction causing severe rash involving mucus membranes or skin necrosis: No Has patient had a PCN reaction that required hospitalization No Has patient had a PCN reaction occurring within the last 10 years: No If all of the above answers are "NO", then may proceed with  Cephalosporin use.   Sulfa Antibiotics Swelling, Other (See Comments)   Facial/lip swelling        Medication List     STOP taking these medications    acetaminophen 500 MG tablet Commonly known as: TYLENOL   doxycycline 100 MG capsule Commonly known as: VIBRAMYCIN   HYDROcodone-acetaminophen 7.5-325 MG tablet Commonly known as: NORCO   nitroGLYCERIN 0.2 mg/hr patch Commonly known as: NITRODUR - Dosed in mg/24 hr       TAKE these medications    amLODipine 2.5 MG tablet Commonly known as: NORVASC Take 2.5 mg by mouth at bedtime.   aspirin EC 81 MG tablet Take 81 mg by mouth at bedtime.   atorvastatin 40 MG tablet Commonly known as: LIPITOR Take 40 mg by mouth at bedtime.   clopidogrel 75 MG tablet Commonly known as: Plavix Take 1 tablet (75 mg total) by mouth daily. What changed: when to take this   irbesartan-hydrochlorothiazide 300-12.5 MG tablet Commonly known as: AVALIDE Take 1 tablet by mouth at bedtime.   multivitamin capsule Take 2 capsules by mouth at bedtime. Gummy   sildenafil 100 MG tablet Commonly known as: VIAGRA Take 100 mg by mouth as needed for erectile dysfunction.        Diagnostic Studies: VAS [place] LOWER EXTREMITY ARTERIAL DUPLEX  Result Date: 04/28/2022 LOWER EXTREMITY ARTERIAL DUPLEX STUDY Patient Name:  TREVONN HALLUM  Date of Exam:   04/28/2022  Medical Rec #: [id number]          Accession #:    [id number] Date of Birth: 07-05-1956         Patient Gender: M Patient Age:   29 years Exam Location:  [facility] Procedure:      VAS [place] LOWER EXTREMITY ARTERIAL DUPLEX Referring Phys: Harold Barban --------------------------------------------------------------------------------  Indications: Ulceration. High Risk Factors: Hypertension, hyperlipidemia.  Vascular Interventions: Most Recent: Aortogram 04/15/22 By Dr Trula Slade. Left Pop A,                         LT Peroneal A, RT Pop A, and RT ATA angioplasty. Current ABI:             Prior ABI was unable to calculate due to noncompressible                         arteries. Comparison Study: Prior ABI 04/10/22 Performing Technologist: Elta Guadeloupe RVT, RDMS  Examination Guidelines: A complete evaluation includes B-mode imaging, spectral Doppler, color Doppler, and power Doppler as needed of all accessible portions of each vessel. Bilateral testing is considered an integral part of a complete examination. Limited examinations for reoccurring indications may be performed as noted.  +-----------+--------+-----+---------------+-----------+-----------------------+ RIGHT      PSV cm/sRatioStenosis       Waveform   Comments                +-----------+--------+-----+---------------+-----------+-----------------------+ POP Prox   88                          triphasic                          +-----------+--------+-----+---------------+-----------+-----------------------+ POP Distal 88                          triphasic  diffuse atherosclerosis +-----------+--------+-----+---------------+-----------+-----------------------+ ATA Distal 101                         monophasic hyperemic waveform,                                                       diffuse atherosclerosis                                                   with calcified plaque   +-----------+--------+-----+---------------+-----------+-----------------------+ PTA Distal 0            occluded                                          +-----------+--------+-----+---------------+-----------+-----------------------+ PERO Prox  248     2.5  50-74% stenosismultiphasicdiffuse atherosclerosis  with calcified plaque   +-----------+--------+-----+---------------+-----------+-----------------------+ PERO Distal41                          monophasic diffuse atherosclerosis                                                   with  calcified plaque   +-----------+--------+-----+---------------+-----------+-----------------------+  +-----------+--------+-----+--------+---------+--------------------------------+ LEFT       PSV cm/sRatioStenosisWaveform Comments                         +-----------+--------+-----+--------+---------+--------------------------------+ POP Prox   107                  triphasic                                 +-----------+--------+-----+--------+---------+--------------------------------+ POP Distal 58                   triphasic                                 +-----------+--------+-----+--------+---------+--------------------------------+ ATA Distal 67                   biphasic diffuse atherosclerosis with                                              calcified plaque                 +-----------+--------+-----+--------+---------+--------------------------------+ PTA Distal 42                   triphasicdiffuse atherosclerosis with                                              calcified plaque                 +-----------+--------+-----+--------+---------+--------------------------------+ PERO Distal26                   triphasicdiffuse atherosclerosis with                                              calcified plaque                 +-----------+--------+-----+--------+---------+--------------------------------+  Summary: Right: 50-74% stenosis noted in the peroneal artery. Right PTA appears occluded distally. Right Distal Perotoneal artery waveform is blunted and monophasic. Left: All visualized Left lower extremity arteries are patent with multiphasic waveforms. The calf and ankle vessels are diffusely calcified bilaterally.  See table(s) above for measurements and observations. Electronically signed by Harold Barban MD on 04/28/2022 at 62:14:12 PM.    Final    PERIPHERAL VASCULAR CATHETERIZATION  Result Date: 04/15/2022 Images from the original  result were not included. Patient name: RUVIM RISKO MRN: [id number] DOB:  05/12/1956 Sex: male 04/15/2022 Pre-operative Diagnosis: right toe ulcer Post-operative diagnosis:  Same Surgeon:  [name] Procedure Performed:  1.  Ultrasound-guided access, left femoral artery  2.  Abdominal aortogram  3.  Bilateral lower extremity runoff  4.  Angioplasty, right popliteal artery  5.  Angioplasty, right anterior tibial artery  6.  Conscious sedation, 78 minutes  7.  Closure device, Celt WiFi: 1, 0, 1 Indications: This is a 66 year old gentleman who has previously undergone bilateral percutaneous revascularization.  He is now scheduled for transportation for a nonhealing wound.  Angiography was recommended to optimize blood flow to prevent more proximal amputation. Procedure:  The patient was identified in the holding area and taken to room 8.  The patient was then placed supine on the table and prepped and draped in the usual sterile fashion.  A time out was called.  Conscious sedation was administered with the use of IV fentanyl and Versed under continuous physician and nurse monitoring.  Heart rate, blood pressure, and oxygen saturation were continuously monitored.  Total sedation time was 78 minutes.  Ultrasound was used to evaluate the left common femoral artery.  It was patent .  A digital ultrasound image was acquired.  A micropuncture needle was used to access the left common femoral artery under ultrasound guidance.  An 018 wire was advanced without resistance and a micropuncture sheath was placed.  The 018 wire was removed and a benson wire was placed.  The micropuncture sheath was exchanged for a 5 french sheath.  An omniflush catheter was advanced over the wire to the level of L-1.  An abdominal angiogram was obtained.  Next, using the omniflush catheter and a benson wire, the aortic bifurcation was crossed and the catheter was placed into theright external iliac artery and right runoff was obtained.  left  runoff was performed via retrograde sheath injections. Findings:  Aortogram: No significant renal artery stenosis was identified.  The infrarenal abdominal aorta is widely patent.  Bilateral common and external iliac arteries are widely patent.  Right Lower Extremity: The right common femoral, profundofemoral, and superficial femoral artery are widely patent.  The popliteal artery is patent throughout its course it is heavily calcified with mild luminal irregularity, stenosis approximately 50%.  There is a high-grade lesion at the origin of the anterior tibial artery, greater than 80%.  The tibial vessels become diffusely diseased as they go down to the foot with diffuse disease out onto the foot.  Left Lower Extremity: The left common femoral, profundofemoral, and superficial femoral artery are widely patent.  The popliteal artery is calcified but patent throughout its course.  The dominant runoff vessels the anterior tibial and peroneal artery Intervention: After the above images were acquired the decision made to proceed with intervention.  A 5 French sheath was advanced into the right external iliac artery.  The patient is fully heparinized.  Next using a V-18 wire and a 2.5 x 100 Sterling balloon, wire access was obtained into the anterior tibial artery down to the ankle.  I performed balloon angioplasty using multiple inflations at 40 atm from the ankle up through the anterior tibial artery into the popliteal artery at the level of the joint space.  Completion imaging showed significant improvement in the stenosis.  I elected to upsized to a 3 mm balloon to treat the popliteal artery and proximal anterior tibial artery.  Completion imaging was then performed which showed no residual high-grade lesions.  There remains diffuse disease out onto the  foot.  The patient has been optimized from a vascular perspective.  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  Successfully treated using a 3 mm balloon.  #2  The patient has been optimized from a vascular perspective to proceed with palpitation tomorrow V. [name], M.D., [facility] [place] Office: [phone] Pager:  [phone]    Patient benefited maximally from their hospital stay and there were no complications.     Disposition: Discharge disposition: 02-Transferred to [facility]      Discharge Instructions     Call MD / Call 911   Complete by: As directed    If you experience chest pain or shortness of breath, CALL 911 and be transported to the hospital emergency room.  If you develope a fever above 101 F, pus (white drainage) or increased drainage or redness at the wound, or calf pain, call your surgeon's office.   Constipation Prevention   Complete by: As directed    Drink plenty of fluids.  Prune juice may be helpful.  You may use a stool softener, such as Colace (over the counter) 100 mg twice a day.  Use MiraLax (over the counter) for constipation as needed.   Diet - low sodium heart healthy   Complete by: As directed    Increase activity slowly as tolerated   Complete by: As directed    Negative Pressure Wound Therapy - Incisional   Complete by: As directed    Continue wound VAC while in CIR.  At discharge from CIR please remove the wound VAC dressing and apply the stump shrinker.   Post-operative opioid taper instructions:   Complete by: As directed    POST-OPERATIVE OPIOID TAPER INSTRUCTIONS: It is important to wean off of your opioid medication as soon as possible. If you do not need pain medication after your surgery it is ok to stop day one. Opioids include: Codeine, Hydrocodone(Norco, Vicodin), Oxycodone(Percocet, oxycontin) and hydromorphone amongst others.  Long term and even short term use of opiods can cause: Increased pain  response Dependence Constipation Depression Respiratory depression And more.  Withdrawal symptoms can include Flu like symptoms Nausea, vomiting And more Techniques to manage these symptoms Hydrate well Eat regular healthy meals Stay active Use relaxation techniques(deep breathing, meditating, yoga) Do Not substitute Alcohol to help with tapering If you have been on opioids for less than two weeks and do not have pain than it is ok to stop all together.  Plan to wean off of opioids This plan should start within one week post op of your joint replacement. Maintain the same interval or time between taking each dose and first decrease the dose.  Cut the total daily intake of opioids by one tablet each day Next start to increase the time between doses. The last dose that should be eliminated is the evening dose.          Follow-up Information     Newt Minion, MD Follow up in 1 week(s).   Specialty: Orthopedic Surgery Contact information: [address] [place]  [address] [phone]                  Signed: Newt Minion 05/14/2022, 7:22 AM

## 2022-05-14 NOTE — Progress Notes (Signed)
Inpatient Rehabilitation Admission Medication Review by a Pharmacist  A complete drug regimen review was completed for this patient to identify any potential clinically significant medication issues.  High Risk Drug Classes Is patient taking? Indication by Medication  Antipsychotic Yes Compazine-for nausea  Anticoagulant Yes Lovenox- VTE prophylaxis  Antibiotic No   Opioid Yes Oxycodone- for pain  Antiplatelet Yes ASA, plavix- CVA  Hypoglycemics/insulin No   Vasoactive Medication Yes Amlodipine, Irbesartan /HCTZ-HTN  Chemotherapy No   Other Yes Atorvastatin-HLD Ascorbic acid/ zinc sulfate - wound healing Protonix-GERD Trazodone-sleep     Type of Medication Issue Identified Description of Issue Recommendation(s)  Drug Interaction(s) (clinically significant)     Duplicate Therapy     Allergy     No Medication Administration End Date     Incorrect Dose     Additional Drug Therapy Needed     Significant med changes from prior encounter (inform family/care partners about these prior to discharge). PTA doxycycline was discontinued by Dr. Sharol Given on [facility] acute care. Stopped  on discharge orders 05/14/22.   Other       Clinically significant medication issues were identified that warrant physician communication and completion of prescribed/recommended actions by midnight of the next day:  No  Name of provider notified for urgent issues identified:   Provider Method of Notification:     Pharmacist comments:   Time spent performing this drug regimen review (minutes):  [place], [place] Clinical Pharmacist [phone] 05/14/2022 3:31 PM Please check AMION for all [place] phone numbers After 10:00 PM, call [place]

## 2022-05-15 DIAGNOSIS — R770 Abnormality of albumin: Secondary | ICD-10-CM

## 2022-05-15 LAB — COMPREHENSIVE METABOLIC PANEL
ALT: 36 U/L (ref 0–44)
AST: 27 U/L (ref 15–41)
Albumin: 2.3 g/dL — ABNORMAL LOW (ref 3.5–5.0)
Alkaline Phosphatase: 46 U/L (ref 38–126)
Anion gap: 6 (ref 5–15)
BUN: 34 mg/dL — ABNORMAL HIGH (ref 8–23)
CO2: 29 mmol/L (ref 22–32)
Calcium: 8.7 mg/dL — ABNORMAL LOW (ref 8.9–10.3)
Chloride: 105 mmol/L (ref 98–111)
Creatinine, Ser: 1.11 mg/dL (ref 0.61–1.24)
GFR, Estimated: >60 mL/min (ref >60)
Glucose, Bld: 109 mg/dL — ABNORMAL HIGH (ref 70–99)
Potassium: 4.4 mmol/L (ref 3.5–5.1)
Sodium: 140 mmol/L (ref 135–145)
Total Bilirubin: 0.5 mg/dL (ref 0.3–1.2)
Total Protein: 5.1 g/dL — ABNORMAL LOW (ref 6.5–8.1)

## 2022-05-15 LAB — CBC WITH DIFFERENTIAL/PLATELET
Abs Immature Granulocytes: 0.02 10*3/uL (ref 0.00–0.07)
Basophils Absolute: 0 10*3/uL (ref 0.0–0.1)
Basophils Relative: 1 %
Eosinophils Absolute: 0.1 10*3/uL (ref 0.0–0.5)
Eosinophils Relative: 2 %
HCT: 24.2 % — ABNORMAL LOW (ref 39.0–52.0)
Hemoglobin: 8.1 g/dL — ABNORMAL LOW (ref 13.0–17.0)
Immature Granulocytes: 0 %
Lymphocytes Relative: 23 %
Lymphs Abs: 1.3 10*3/uL (ref 0.7–4.0)
MCH: 31.8 pg (ref 26.0–34.0)
MCHC: 33.5 g/dL (ref 30.0–36.0)
MCV: 94.9 fL (ref 80.0–100.0)
Monocytes Absolute: 0.5 10*3/uL (ref 0.1–1.0)
Monocytes Relative: 9 %
Neutro Abs: 3.6 10*3/uL (ref 1.7–7.7)
Neutrophils Relative %: 65 %
Platelets: 218 10*3/uL (ref 150–400)
RBC: 2.55 MIL/uL — ABNORMAL LOW (ref 4.22–5.81)
RDW: 11.9 % (ref 11.5–15.5)
WBC: 5.6 10*3/uL (ref 4.0–10.5)
nRBC: 0 % (ref 0.0–0.2)

## 2022-05-15 LAB — GLUCOSE, CAPILLARY: Glucose-Capillary: 103 mg/dL — ABNORMAL HIGH (ref 70–99)

## 2022-05-15 MED ORDER — JUVEN PO PACK: 1.0000 | PACK | Freq: Two times a day (BID) | ORAL | 0 refills | Status: DC

## 2022-05-15 MED ORDER — POLYETHYLENE GLYCOL 3350 17 G PO PACK: 17.0000 g | PACK | Freq: Every day | ORAL | 0 refills | Status: DC | PRN

## 2022-05-15 MED ORDER — ZINC SULFATE 220 (50 ZN) MG PO CAPS: 220.0000 mg | ORAL_CAPSULE | Freq: Every day | ORAL | Status: AC

## 2022-05-15 MED ORDER — ASCORBIC ACID 1000 MG PO TABS: 1000.0000 mg | ORAL_TABLET | Freq: Every day | ORAL | Status: DC

## 2022-05-15 MED ORDER — ACETAMINOPHEN 325 MG PO TABS: 325.0000 mg | ORAL_TABLET | ORAL | Status: DC | PRN

## 2022-05-15 NOTE — Progress Notes (Signed)
Patient ID: Clinton [name], male   DOB: 04/14/1956, 66 y.o.   MRN: [id number] Referral made to [employer] for wheelchair, wife to get rolling walker on own since not covered. Pt prefers to go home this weekend. No OT equipment needs wife to get tub bench on own. Team to decide discharge date and get back with this worker

## 2022-05-15 NOTE — Progress Notes (Signed)
Inpatient Rehabilitation  Patient information reviewed and entered into eRehab system by Vela Render M. Jaidyn Kuhl, M.A., CCC/SLP, PPS Coordinator.  Information including medical coding, functional ability and quality indicators will be reviewed and updated through discharge.    

## 2022-05-15 NOTE — Progress Notes (Signed)
Patient ID: URBAN NAVAL, male   DOB: 1956/01/27, 66 y.o.   MRN: [id number]  SW made efforts to meet with pt to complete assessment but pt in PT session. SW will continue to make efforts to complete, and will continue to follow for discharge needs.  Loralee Pacas, MSW, [place] Office: [phone] Cell: [phone] Fax: [phone]

## 2022-05-15 NOTE — Discharge Instructions (Addendum)
Inpatient Rehab Discharge Instructions  [place] Discharge date and time:    Activities/Precautions/ Functional Status: Activity: no lifting, driving, or strenuous exercise  Diet: cardiac diet Wound Care: Wash with soap and water, keep clean and dry, apply stump shrinker.  Contact Dr. Sharol Given if you develop any problems with your incision/wound--redness, swelling, increase in pain, drainage or if you develop fever or chills.     Functional status:  ___ No restrictions     ___ Walk up steps independently ___ 24/7 supervision/assistance   ___ Walk up steps with assistance ___ Intermittent supervision/assistance  ___ Bathe/dress independently ___ Walk with walker     ___ Bathe/dress with assistance ___ Walk Independently    ___ Shower independently ___ Walk with assistance    ___ Shower with assistance _X__ No alcohol     ___ Return to work/school ________  Special Instructions:    COMMUNITY REFERRALS UPON DISCHARGE:     Outpatient: PT             Agency: [employer]  Phone: [phone]             Appointment Date/Time: WILL FOLLOW UP WITH WIFE TO MAKE APPOINTMENT  Medical Equipment/Items Ordered:wheelchair  wife will get tub bench and rolling walker on her own due to private pay                                                 Agency/Supplier:[employer] [facility]  [phone]      My questions have been answered and I understand these instructions. I will adhere to these goals and the provided educational materials after my discharge from the hospital.  Patient/Caregiver Signature _______________________________ Date __________  Clinician Signature _______________________________________ Date __________  Please bring this form and your medication list with you to all your follow-up doctor's appointments.

## 2022-05-15 NOTE — Evaluation (Signed)
Physical Therapy Assessment and Plan  Patient Details  Name: Clinton [name] MRN: [id number] Date of Birth: [birth year]  PT Diagnosis: Abnormality of gait, Difficulty walking, and Pain in joint Rehab Potential: Excellent ELOS: 5-7 days   Today's Date: 05/15/2022 PT Individual Time: 3500-9381 PT Individual Time Calculation (min): 76 min    Hospital Problem: Principal Problem:   S/P BKA (below knee amputation), right [facility])   Past Medical History:  Past Medical History:  Diagnosis Date   Carotid artery occlusion    Cellulitis    Erectile dysfunction    Hyperlipidemia    Hypertension    Peripheral vascular disease ([place])    Past Surgical History:  Past Surgical History:  Procedure Laterality Date   ABDOMINAL AORTOGRAM W/LOWER EXTREMITY N/A 02/04/2022   Procedure: ABDOMINAL AORTOGRAM W/LOWER EXTREMITY;  Surgeon: Serafina Mitchell, MD;  Location: [place] [facility];  Service: Cardiovascular;  Laterality: N/A;   ABDOMINAL AORTOGRAM W/LOWER EXTREMITY N/A 02/18/2022   Procedure: ABDOMINAL AORTOGRAM W/LOWER EXTREMITY;  Surgeon: Serafina Mitchell, MD;  Location: [place] [facility];  Service: Cardiovascular;  Laterality: N/A;   ABDOMINAL AORTOGRAM W/LOWER EXTREMITY N/A 04/15/2022   Procedure: ABDOMINAL AORTOGRAM W/LOWER EXTREMITY;  Surgeon: Serafina Mitchell, MD;  Location: [place] [facility];  Service: Cardiovascular;  Laterality: N/A;   AMPUTATION Right 04/16/2022   Procedure: RIGHT 5TH RAY AMPUTATION;  Surgeon: Newt Minion, MD;  Location: [place];  Service: Orthopedics;  Laterality: Right;   AMPUTATION Right 05/09/2022   Procedure: RIGHT BELOW KNEE AMPUTATION;  Surgeon: Newt Minion, MD;  Location: [place];  Service: Orthopedics;  Laterality: Right;   COLONOSCOPY     ENDARTERECTOMY Right 09/05/2015   Procedure: ENDARTERECTOMY CAROTID RIGHT;  Surgeon: Elam Dutch, MD;  Location: [facility] OR;  Service: Vascular;  Laterality: Right;   PATCH ANGIOPLASTY  09/05/2015   Procedure: PATCH ANGIOPLASTY  RIGHT CAROTID ARTERY USING HEMASHIELD PLATINUM FINESSE PATCH;  Surgeon: Elam Dutch, MD;  Location: [place];  Service: Vascular;;   PERIPHERAL VASCULAR ATHERECTOMY Left 02/18/2022   Procedure: PERIPHERAL VASCULAR ATHERECTOMY;  Surgeon: Serafina Mitchell, MD;  Location: [place] [facility];  Service: Cardiovascular;  Laterality: Left;  Popliteal and Peroneal   PERIPHERAL VASCULAR BALLOON ANGIOPLASTY  02/04/2022   Procedure: PERIPHERAL VASCULAR BALLOON ANGIOPLASTY;  Surgeon: Serafina Mitchell, MD;  Location: [place] [facility];  Service: Cardiovascular;;   PERIPHERAL VASCULAR BALLOON ANGIOPLASTY Right 04/15/2022   Procedure: PERIPHERAL VASCULAR BALLOON ANGIOPLASTY;  Surgeon: Serafina Mitchell, MD;  Location: [place] [facility];  Service: Cardiovascular;  Laterality: Right;  SFA/POPLITEAL   TONSILLECTOMY     VASECTOMY      Assessment & Plan Clinical Impression: Clinton [name] is a HTN, CAS, 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 Sharol Given on 05/09/22. Post op to continues to have wound VAC in place till discharge from rehab. Follow up labs showed acute renal failure with rise in BUN/SCr to 40/1.55 as well as ABLA. Pain control improving but he continues to be limited by weakness and loss of limb. Reports occasional pain in residual limb and denies phantom pain. CIR recommended due to functional decline.  Patient transferred to CIR on 05/14/2022 .   Patient currently requires min with mobility secondary to muscle weakness and decreased standing balance, decreased balance strategies, and difficulty maintaining precautions.  Prior to hospitalization, patient was independent  with mobility and lived with Spouse in a House home.  Home access is 2Stairs to enter.  Patient will benefit from skilled PT intervention to maximize safe  functional mobility, minimize fall risk, and decrease caregiver burden for planned discharge home with 24 hour supervision.  Anticipate patient will benefit from follow up OP at discharge.  PT - End of Session Activity Tolerance: Tolerates 30+ min activity with multiple rests Endurance Deficit: No PT Assessment Rehab Potential (ACUTE/IP ONLY): Excellent PT Barriers to Discharge: Inaccessible home environment;Home environment access/layout;Weight bearing restrictions PT Patient demonstrates impairments in the following area(s): Balance;Pain;Safety;Skin Integrity PT Transfers Functional Problem(s): Bed Mobility;Bed to Chair;Car PT Locomotion Functional Problem(s): Ambulation;Wheelchair Mobility;Stairs PT Plan PT Intensity: Minimum of 1-2 x/day ,45 to 90 minutes PT Frequency: 5 out of 7 days PT Duration Estimated Length of Stay: 5-7 days PT Treatment/Interventions: Ambulation/gait training;Discharge planning;DME/adaptive equipment instruction;Functional mobility training;Pain management;Psychosocial support;Therapeutic Activities;UE/LE Strength taining/ROM;Wheelchair propulsion/positioning;UE/LE Coordination activities;Therapeutic Exercise;Stair training;Skin care/wound management;Patient/family education;Neuromuscular re-education;Disease management/prevention;Community reintegration;Balance/vestibular training PT Transfers Anticipated Outcome(s): supervision transfers PT Locomotion Anticipated Outcome(s): supervision short distance gait PT Recommendation Follow Up Recommendations: Outpatient PT Patient destination: Home Equipment Recommended: Wheelchair cushion (measurements);Wheelchair (measurements);Rolling walker with 5" wheels   PT Evaluation Precautions/Restrictions Precautions Precautions: None Required Braces or Orthoses: Other Brace Other Brace: residual limb protector Restrictions Weight Bearing Restrictions: Yes RLE Weight Bearing: Non weight bearing Other Position/Activity  Restrictions: Order set says TDWB but per prior note Dr Sharol Given stated NWB would be best and pt reports NWB General   Vital Signs  Pain Pain Assessment Pain Scale: 0-10 Pain Score: 3  Pain Type: Surgical pain Pain Location: Leg Pain Orientation: Right Pain Descriptors / Indicators: Aching Pain Frequency: Intermittent Pain Onset: On-going Patients Stated Pain Goal: 1 Pain Intervention(s): Pain med given for lower pain score than stated, per patient request;Relaxation;Rest;Repositioned Multiple Pain Sites: No Pain Interference Pain Interference Pain Effect on Sleep: 1. Rarely or not at all Pain Interference with Therapy Activities: 1. Rarely or not at all Pain Interference with Day-to-Day Activities: 1. 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, hoping to return to office) Leisure: Hobbies-yes (Comment) Vision/Perception  Vision - History Ability to See in Adequate Light: 0 Adequate Perception Perception: Within Functional Limits Praxis Praxis: Intact  Cognition Overall Cognitive Status: Within Functional Limits for tasks assessed Orientation Level: Oriented X4 Month: October Day of Week: Correct Memory: Appears intact Awareness: Appears intact Problem Solving: Appears intact Safety/Judgment: Appears intact Sensation Sensation Light Touch: Appears  Intact Hot/Cold: Appears Intact Proprioception: Not tested Stereognosis: Not tested Coordination Gross Motor Movements are Fluid and Coordinated: No Coordination and Movement Description: dyscoordinated d/t COG shift 2/2 recent amputation Motor  Motor Motor: Within Functional Limits   Trunk/Postural Assessment  Cervical Assessment Cervical Assessment: Within Functional Limits Thoracic Assessment Thoracic Assessment: Within Functional Limits Lumbar Assessment Lumbar Assessment: Within Functional Limits Postural Control Postural Control: Within Functional Limits  Balance Balance Balance Assessed: Yes Static Sitting Balance Static Sitting - Balance Support: Feet supported Static Sitting -  Level of Assistance: 6: Modified independent (Device/Increase time) Dynamic Sitting Balance Dynamic Sitting - Balance Support: During functional activity Dynamic Sitting - Level of Assistance: 5: Stand by assistance Static Standing Balance Static Standing - Balance Support: During functional activity Static Standing - Level of Assistance: 5: Stand by assistance Dynamic Standing Balance Dynamic Standing - Balance Support: During functional activity;Bilateral upper extremity supported Dynamic Standing - Level of Assistance: 4: Min assist Extremity Assessment      RLE Assessment RLE Assessment: Exceptions to [facility] General Strength Comments: >3+/5 hip and knee. No resistance d/t recent amputation LLE Assessment LLE Assessment: Within Functional Limits General Strength Comments: 5/5, asessed in sitting  Care Tool Care Tool Bed Mobility Roll left and right activity   Roll left and right assist level: Independent with assistive device Roll left and right assistive device comment: time  Sit to lying activity   Sit to lying assist level: Supervision/Verbal cueing    Lying to sitting on side of bed activity   Lying to sitting on side of bed assist level: the ability to move from lying on the  back to sitting on the side of the bed with no back support.: Supervision/Verbal cueing     Care Tool Transfers Sit to stand transfer   Sit to stand assist level: Contact Guard/Touching assist    Chair/bed transfer   Chair/bed transfer assist level: Contact Guard/Touching assist     Toilet transfer   Assist Level: Minimal Assistance - Patient > 75%    Car transfer   Car transfer assist level: Contact Guard/Touching assist      Care Tool Locomotion Ambulation   Assist level: Contact Guard/Touching assist Assistive device: Walker-rolling Max distance: 60 ft  Walk 10 feet activity   Assist level: Contact Guard/Touching assist Assistive device: Walker-rolling   Walk 50 feet with 2 turns activity   Assist level: Contact Guard/Touching assist Assistive device: Walker-rolling  Walk 150 feet activity Walk 150 feet activity did not occur: Safety/medical concerns      Walk 10 feet on uneven surfaces activity Walk 10 feet on uneven surfaces activity did not occur: Safety/medical concerns      Stairs   Assist level: Minimal Assistance - Patient > 75% Stairs assistive device: 2 hand rails Max number of stairs: 1  Walk up/down 1 step activity   Walk up/down 1 step (curb) assist level: Minimal Assistance - Patient > 75% Walk up/down 1 step or curb assistive device: 1 hand rail  Walk up/down 4 steps activity Walk up/down 4 steps activity did not occur: Safety/medical concerns      Walk up/down 12 steps activity Walk up/down 12 steps activity did not occur: Safety/medical concerns      Pick up small objects from floor Pick up small object from the floor (from standing position) activity did not occur: Safety/medical concerns      Wheelchair Is the patient using a wheelchair?: Yes Type of Wheelchair: Manual   Wheelchair assist level: Supervision/Verbal cueing Max wheelchair distance: 120 ft  Wheel 50 feet with 2 turns activity   Assist Level: Supervision/Verbal cueing  Wheel  150 feet activity   Assist Level: Minimal Assistance - Patient > 75%    Refer to Care Plan for [place] [facility] PT Short Term Goal 1 (Week 1): =LTGs d/t ELOS  Recommendations for other services: None   Skilled Therapeutic Intervention Evaluation completed (see details above) with patient education regarding purpose of PT evaluation, PT POC and  goals, therapy schedule, weekly team meetings, and other CIR information including safety plan and fall risk safety. Pt reports pain managed on medication. MMT and sensory testing in sitting. Pt performed the below functional mobility tasks with the specified levels of skilled cuing and assistance. Pt performed gait x 60 ft with RW and CGA, bed mobility on mat table with assist only to manage wound vac line. Discussed laying prone to prevent hip flexion contractures. Navigated 6" step in // bars x 2 fwd and backwards. Pt returned to room and to recliner, was left with all needs in reach and alarm active.      Mobility Bed Mobility Bed Mobility: Rolling Right;Rolling Left;Supine to Sit Rolling Right: Independent with assistive device Rolling Left: Independent with assistive device Supine to Sit: Supervision/Verbal cueing Transfers Transfers: Sit to Stand;Stand Pivot Transfers Sit to Stand: Contact Guard/Touching assist Stand Pivot Transfers: Contact Guard/Touching assist Transfer (Assistive device): Rolling walker Locomotion  Gait Ambulation: Yes Gait Assistance: Contact Guard/Touching assist Gait Distance (Feet): 60 Feet Assistive device: Rolling walker Gait Assistance Details: Verbal cues for safe use of DME/AE;Verbal cues for precautions/safety Gait Gait: Yes Gait Pattern: Impaired Gait Pattern: Step-to pattern Gait velocity: decreased Stairs / Additional Locomotion Stairs: Yes Stairs Assistance: Minimal Assistance - Patient > 75% Stair Management Technique: Two rails Number of Stairs: 1 Height of Stairs:  6 Wheelchair Mobility Wheelchair Mobility: Yes Wheelchair Assistance: Chartered loss adjuster: Both upper extremities Wheelchair Parts Management: Needs assistance Distance: 120 ft   Discharge Criteria: Patient will be discharged from PT if patient refuses treatment 3 consecutive times without medical reason, if treatment goals not met, if there is a change in medical status, if patient makes no progress towards goals or if patient is discharged from hospital.  The above assessment, treatment plan, treatment alternatives and goals were discussed and mutually agreed upon: by patient  Mickel Fuchs 05/15/2022, 12:31 PM

## 2022-05-15 NOTE — Evaluation (Signed)
Occupational Therapy Assessment and Plan  Patient Details  Name: Clinton [name] MRN: [id number] Date of Birth: Mar 01, 1956  OT Diagnosis: acute pain and new R BKA with balance, skin integrity deficits  Rehab Potential: Rehab Potential (ACUTE ONLY): Excellent ELOS: 5-7 days   Today's Date: 05/15/2022 OT Individual Time: 7829-5621 OT Individual Time Calculation (min): 60 min     Hospital Problem: Principal Problem:   S/P BKA (below knee amputation), right [facility])   Past Medical History:  Past Medical History:  Diagnosis Date   Carotid artery occlusion    Cellulitis    Erectile dysfunction    Hyperlipidemia    Hypertension    Peripheral vascular disease ([place])    Past Surgical History:  Past Surgical History:  Procedure Laterality Date   ABDOMINAL AORTOGRAM W/LOWER EXTREMITY N/A 02/04/2022   Procedure: ABDOMINAL AORTOGRAM W/LOWER EXTREMITY;  Surgeon: Serafina Mitchell, MD;  Location: [place] [facility];  Service: Cardiovascular;  Laterality: N/A;   ABDOMINAL AORTOGRAM W/LOWER EXTREMITY N/A 02/18/2022   Procedure: ABDOMINAL AORTOGRAM W/LOWER EXTREMITY;  Surgeon: Serafina Mitchell, MD;  Location: [place] [facility];  Service: Cardiovascular;  Laterality: N/A;   ABDOMINAL AORTOGRAM W/LOWER EXTREMITY N/A 04/15/2022   Procedure: ABDOMINAL AORTOGRAM W/LOWER EXTREMITY;  Surgeon: Serafina Mitchell, MD;  Location: [place] [facility];  Service: Cardiovascular;  Laterality: N/A;   AMPUTATION Right 04/16/2022   Procedure: RIGHT 5TH RAY AMPUTATION;  Surgeon: Newt Minion, MD;  Location: [place];  Service: Orthopedics;  Laterality: Right;   AMPUTATION Right 05/09/2022   Procedure: RIGHT BELOW KNEE AMPUTATION;  Surgeon: Newt Minion, MD;  Location: [place];  Service: Orthopedics;  Laterality: Right;   COLONOSCOPY     ENDARTERECTOMY Right 09/05/2015   Procedure: ENDARTERECTOMY CAROTID RIGHT;  Surgeon: Elam Dutch, MD;  Location: [facility] OR;  Service: Vascular;  Laterality: Right;   PATCH ANGIOPLASTY   09/05/2015   Procedure: PATCH ANGIOPLASTY RIGHT CAROTID ARTERY USING HEMASHIELD PLATINUM FINESSE PATCH;  Surgeon: Elam Dutch, MD;  Location: [place];  Service: Vascular;;   PERIPHERAL VASCULAR ATHERECTOMY Left 02/18/2022   Procedure: PERIPHERAL VASCULAR ATHERECTOMY;  Surgeon: Serafina Mitchell, MD;  Location: [place] [facility];  Service: Cardiovascular;  Laterality: Left;  Popliteal and Peroneal   PERIPHERAL VASCULAR BALLOON ANGIOPLASTY  02/04/2022   Procedure: PERIPHERAL VASCULAR BALLOON ANGIOPLASTY;  Surgeon: Serafina Mitchell, MD;  Location: [place] [facility];  Service: Cardiovascular;;   PERIPHERAL VASCULAR BALLOON ANGIOPLASTY Right 04/15/2022   Procedure: PERIPHERAL VASCULAR BALLOON ANGIOPLASTY;  Surgeon: Serafina Mitchell, MD;  Location: [place] [facility];  Service: Cardiovascular;  Laterality: Right;  SFA/POPLITEAL   TONSILLECTOMY     VASECTOMY      Assessment & Plan Clinical Impression:Clinton [name] is a HTN, CAS, 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 Sharol Given on 05/09/22. Post op to continues to have wound VAC in place till discharge from rehab. Follow up labs showed acute renal failure with rise in BUN/SCr to 40/1.55 as well as ABLA. Pain control improving but he continues to be limited by weakness and loss of limb. Reports occasional pain in residual limb and denies phantom pain. CIR recommended due to functional decline.   Patient currently requires min with basic self-care skills and IADL secondary to muscle weakness and decreased standing balance and decreased balance strategies.  Prior to hospitalization, patient could complete all aspects of ADL's and IADL's incl work and Film/video editor, driving etc with independent level.   Patient will benefit from skilled intervention to decrease level of assist with  basic self-care skills, increase independence with basic self-care skills, and increase level of independence with iADL prior to discharge home with care partner.  Anticipate patient will require intermittent supervision and no further OT follow recommended.  OT - End of Session Activity Tolerance: Endurance does not limit participation in activity Endurance Deficit: No OT Assessment Rehab Potential (ACUTE ONLY): Excellent OT Barriers to Discharge: Inaccessible home environment;Home environment access/layout;Wound Care;Weight bearing restrictions OT Barriers to Discharge Comments: wife able to provide S but does work 5 hrs 2x per day, steps to enter home, shower lip to navigate OT Patient demonstrates impairments in the following area(s): Balance;Endurance;Pain;Skin Integrity OT Basic ADL's Functional Problem(s): Grooming;Bathing;Dressing;Toileting OT Advanced ADL's Functional Problem(s): Simple Meal Preparation OT Transfers Functional Problem(s): Toilet;Tub/Shower OT Additional Impairment(s): None OT Plan OT Intensity: Minimum of 1-2 x/day, 45 to 90 minutes OT Frequency: 5 out of 7 days OT Duration/Estimated Length of Stay: 5-7 days OT Treatment/Interventions: Balance/vestibular training;Disease mangement/prevention;Self Care/advanced ADL retraining;Therapeutic Exercise;Wheelchair propulsion/positioning;DME/adaptive equipment instruction;Pain management;Skin care/wound managment;UE/LE Strength taining/ROM;Patient/family education;Splinting/orthotics;Discharge planning;Functional mobility training;Psychosocial support;Therapeutic Activities OT Self Feeding Anticipated Outcome(s): indep OT Basic Self-Care Anticipated Outcome(s): mod I OT Toileting Anticipated Outcome(s): mod I OT Bathroom Transfers Anticipated Outcome(s): S/mod I OT Recommendation Patient destination: Home Follow Up Recommendations: None Equipment Recommended: Rolling walker with 5" wheels;Wheelchair  (measurements);Wheelchair cushion (measurements) Equipment Details: may need w/c, needs RW   OT Evaluation Precautions/Restrictions  Precautions Precautions: None Required Braces or Orthoses: Other Brace Other Brace: residual limb protector Restrictions Weight Bearing Restrictions: Yes RLE Weight Bearing: Non weight bearing Other Position/Activity Restrictions: Order set says TDWB but per prior note Dr Sharol Given stated NWB would be best and pt reports NWB General Chart Reviewed: Yes Family/Caregiver Present: Yes Vital Signs   Pain Pain Assessment Pain Scale: 0-10 Pain Score: 3  Pain Type: Surgical pain Pain Location: Leg Pain Orientation: Right Pain Descriptors / Indicators: Aching Pain Frequency: Intermittent Pain Onset: On-going Patients Stated Pain Goal: 1 Pain Intervention(s): Pain med given for lower pain score than stated, per patient request;Relaxation;Rest;Repositioned Multiple Pain Sites: No Home Living/Prior Functioning Home Living Available Help at Discharge: Family, Available 24 hours/day Type of Home: House Home Access: Stairs to enter Technical brewer of Steps: 2 Entrance Stairs-Rails: Can reach both Home Layout: One level Bathroom Shower/Tub: Tub/shower unit, Multimedia programmer: Programmer, systems: Yes  Lives With: Spouse IADL History Homemaking Responsibilities: Yes Meal Prep Responsibility: Secondary Current License: Yes Mode of Transportation: Car Occupation: Full time employment Type of Occupation: Engineer, mining Leisure and Hobbies: gardening, grandchildren activities IADL Comments: wife can assist for now Prior Function Level of Independence: Independent with gait, Independent with homemaking with ambulation, Independent with transfers  Able to Take Stairs?: Yes Driving: Yes Vocation: Full time employment Vocation Requirements: currently working from [employer], hoping to return to office) Leisure: Hobbies-yes  (Comment) Vision Baseline Vision/History: 1 Wears glasses Ability to See in Adequate Light: 0 Adequate Patient Visual Report: No change from baseline Vision Assessment?: No apparent visual deficits Perception  Perception: Within Functional Limits Praxis Praxis: Intact Cognition Cognition Overall Cognitive Status: Within Functional Limits for tasks assessed Arousal/Alertness: Awake/alert Memory: Appears intact Awareness: Appears intact Problem Solving: Appears intact Safety/Judgment: Appears intact Brief Interview for Mental Status (BIMS) Repetition of Three Words (First Attempt): 3 Temporal Orientation: Year: Correct  Temporal Orientation: Month: Accurate within 5 days Temporal Orientation: Day: Correct Recall: "Sock": Yes, no cue required Recall: "Blue": Yes, no cue required Recall: "Bed": Yes, no cue required BIMS Summary Score: 15 Sensation Sensation Light Touch: Appears Intact Hot/Cold: Appears Intact Proprioception: Appears Intact Stereognosis: Appears Intact Coordination Gross Motor Movements are Fluid and Coordinated: No Fine Motor Movements are Fluid and Coordinated: Yes Coordination and Movement Description: dyscoordinated d/t COG shift 2/2 recent amputation Finger Nose Finger Test: WNL 9 Hole Peg Test: n/t due to no [facility] issues Motor  Motor Motor: Within Functional Limits  Trunk/Postural Assessment  Cervical Assessment Cervical Assessment: Within Functional Limits Thoracic Assessment Thoracic Assessment: Within Functional Limits Lumbar Assessment Lumbar Assessment: Within Functional Limits Postural Control Postural Control: Within Functional Limits  Balance Balance Balance Assessed: Yes Static Sitting Balance Static Sitting - Balance Support: Feet supported Static Sitting - Level of Assistance: 6: Modified independent (Device/Increase time) Dynamic Sitting Balance Dynamic Sitting - Balance Support: During functional activity Dynamic Sitting - Level  of Assistance: 5: Stand by assistance Static Standing Balance Static Standing - Balance Support: During functional activity Static Standing - Level of Assistance: 5: Stand by assistance Dynamic Standing Balance Dynamic Standing - Balance Support: During functional activity;Bilateral upper extremity supported Dynamic Standing - Level of Assistance: 4: Min assist Extremity/Trunk Assessment RUE Assessment RUE Assessment: Within Functional Limits LUE Assessment LUE Assessment: Within Functional Limits  Care Tool Care Tool Self Care Eating   Eating Assist Level: Independent    Oral Care    Oral Care Assist Level: Set up assist    Bathing   Body parts bathed by patient: Right arm;Left arm;Chest;Abdomen;Front perineal area;Buttocks;Right upper leg;Left upper leg;Left lower leg;Face   Body parts n/a: Left lower leg Assist Level: Minimal Assistance - Patient > 75%    Upper Body Dressing(including orthotics)   What is the patient wearing?: Pull over shirt   Assist Level: Set up assist    Lower Body Dressing (excluding footwear)   What is the patient wearing?: Underwear/pull up;Pants;Ace wrap/stump shrinker Assist for lower body dressing: Moderate Assistance - Patient 50 - 74%    Putting on/Taking off footwear   What is the patient wearing?: Socks;Shoes Assist for footwear: Minimal Assistance - Patient > 75%       Care Tool Toileting Toileting activity   Assist for toileting: Contact Guard/Touching assist     Care Tool Bed Mobility Roll left and right activity   Roll left and right assist level: Independent with assistive device Roll left and right assistive device comment: time  Sit to lying activity   Sit to lying assist level: Supervision/Verbal cueing    Lying to sitting on side of bed activity   Lying to sitting on side of bed assist level: the ability to move from lying on the back to sitting on the side of the bed with no back support.: Supervision/Verbal cueing      Care Tool Transfers Sit to stand transfer   Sit to stand assist level: Contact Guard/Touching assist    Chair/bed transfer   Chair/bed transfer assist level: Contact Guard/Touching assist     Toilet transfer   Assist Level: Minimal Assistance - Patient > 75%     Care Tool Cognition  Expression of Ideas and Wants Expression of Ideas and Wants: 4. Without difficulty (complex and basic) - expresses complex messages without difficulty and with speech that is clear and easy to understand  Understanding Verbal and Non-Verbal Content Understanding Verbal and Non-Verbal Content: 4.  Understands (complex and basic) - clear comprehension without cues or repetitions   Memory/Recall Ability Memory/Recall Ability : Current season;Location of own room;That he or she is in a hospital/hospital unit;Staff names and faces   Refer to Care Plan for [place] 1 OT Short Term Goal 1 (Week 1): STG's=LTG's d/t LOC  Recommendations for other services: None    Skilled Therapeutic Intervention ADL ADL Eating: Independent Where Assessed-Eating: Bed level Grooming: Setup Where Assessed-Grooming: Wheelchair;Sitting at sink Upper Body Bathing: Setup Where Assessed-Upper Body Bathing: Sitting at sink Lower Body Bathing: Minimal assistance Where Assessed-Lower Body Bathing: Standing at sink;Sitting at sink;Wheelchair Upper Body Dressing: Setup Where Assessed-Upper Body Dressing: Sitting at sink Lower Body Dressing: Minimal assistance Where Assessed-Lower Body Dressing: Standing at sink;Sitting at sink Toileting: Minimal assistance Where Assessed-Toileting: Glass blower/designer: Psychiatric nurse Method: Counselling psychologist: Energy manager: Unable to assess (OT to assess later session due to time constraints) ADL Comments: pt has R residual limb wound vac requiring skilled OT assist to manage lines and feeding through  garments, pt able to use RW to amb from bed to toilet and to sinkside w/c access with min A. Min A for toileting on regular toilet with grab bars, min A for R residual limb shrinker, donning garments over R LB 1st and for stability in stanidng at RW. Seated UB self care with set up. Shwer stall transfer TBA later visit due to time constraints of full AM self care session and eval. Mobility  Bed Mobility Bed Mobility: Rolling Right;Rolling Left;Supine to Sit Rolling Right: Independent with assistive device Rolling Left: Independent with assistive device Supine to Sit: Supervision/Verbal cueing Transfers Sit to Stand: Contact Guard/Touching assist  OT Intervention/Treatment:   Pt seen for skilled OT servicies for initial evaulation and full am self care and mobility re-training. Wife present for session and some family education as pt is requesting to be discharged home as soon as possible and safe due to family issues. OT indroduced role and provided safety education and DME recommendations throughout session. Pt has R residual limb wound vac requiring skilled OT assist to manage lines and feeding through garments, pt able to use RW to amb from bed to toilet and to sinkside w/c access with min A. Min A for toileting on regular toilet with grab bars, min A for R residual limb shrinker, donning garments over R LB 1st and for stability in stanidng at RW. Seated UB self care with set up. Shwer stall transfer TBA later visit due to time constraints of full AM self care session and eval.Left pt in w/c with safety alarm set, needs and nurse call button in place.   Discharge Criteria: Patient will be discharged from OT if patient refuses treatment 3 consecutive times without medical reason, if treatment goals not met, if there is a change in medical status, if patient makes no progress towards goals or if patient is discharged from hospital.  The above assessment, treatment plan, treatment alternatives and  goals were discussed and mutually agreed upon: by patient and by family  Barnabas Lister 05/15/2022, 12:44 PM

## 2022-05-15 NOTE — Progress Notes (Signed)
Occupational Therapy Session Note  Patient Details  Name: Clinton [name] MRN: [id number] Date of Birth: [birth year]  Today's Date: 05/15/2022 OT Individual Time: 1300-1400 OT Individual Time Calculation (min): 60 min    Short Term Goals: Week 1:  OT Short Term Goal 1 (Week 1): STG's=LTG's d/t LOC  Skilled Therapeutic Interventions/Progress Updates:  OT intervention with focus on bed mobiity, sit<>stand, standing balance, functional amb with RW, TTB tranfsers, toilet transfer, education, and discharge planning to prepare pt for discharge on 10/7 or 10/8. TTB transfers and toilet transfers with supervision using RW. Pt amb with RW in/out of bathroom and tub room. Pt to purchase TTB. Pt's wife present during therapy sessions for education. Bed mobility with supervision. Standing balance with supervision. Pt dons/doffs limb guard with supervision. Pt resting in bed with all needs within reach. Bed alarm activated.  Therapy Documentation Precautions:  Precautions Precautions: None Required Braces or Orthoses: Other Brace Other Brace: residual limb protector Restrictions Weight Bearing Restrictions: Yes RLE Weight Bearing: Non weight bearing Other Position/Activity Restrictions: Order set says TDWB but per prior note Dr Sharol Given stated NWB would be best and pt reports NWB Pain: Pt denies pain this afternoon  Therapy/Group: Individual Therapy  [name] 05/15/2022, 2:13 PM

## 2022-05-15 NOTE — Plan of Care (Signed)
  Problem: RH Balance Goal: LTG: Patient will maintain dynamic sitting balance (OT) Description: LTG:  Patient will maintain dynamic sitting balance with assistance during activities of daily living (OT) Flowsheets (Taken 05/15/2022 1216) LTG: Pt will maintain dynamic sitting balance during ADLs with: Independent Goal: LTG Patient will maintain dynamic standing with ADLs (OT) Description: LTG:  Patient will maintain dynamic standing balance with assist during activities of daily living (OT)  Flowsheets (Taken 05/15/2022 1216) LTG: Pt will maintain dynamic standing balance during ADLs with: Supervision/Verbal cueing   Problem: Sit to Stand Goal: LTG:  Patient will perform sit to stand in prep for activites of daily living with assistance level (OT) Description: LTG:  Patient will perform sit to stand in prep for activites of daily living with assistance level (OT) Flowsheets (Taken 05/15/2022 1216) LTG: PT will perform sit to stand in prep for activites of daily living with assistance level: Supervision/Verbal cueing   Problem: RH Grooming Goal: LTG Patient will perform grooming w/assist,cues/equip (OT) Description: LTG: Patient will perform grooming with assist, with/without cues using equipment (OT) Flowsheets (Taken 05/15/2022 1216) LTG: Pt will perform grooming with assistance level of: Independent   Problem: RH Bathing Goal: LTG Patient will bathe all body parts with assist levels (OT) Description: LTG: Patient will bathe all body parts with assist levels (OT) Flowsheets (Taken 05/15/2022 1216) LTG: Pt will perform bathing with assistance level/cueing: Independent with assistive device    Problem: RH Dressing Goal: LTG Patient will perform upper body dressing (OT) Description: LTG Patient will perform upper body dressing with assist, with/without cues (OT). Flowsheets (Taken 05/15/2022 1216) LTG: Pt will perform upper body dressing with assistance level of: Independent Goal: LTG  Patient will perform lower body dressing w/assist (OT) Description: LTG: Patient will perform lower body dressing with assist, with/without cues in positioning using equipment (OT) Flowsheets (Taken 05/15/2022 1216) LTG: Pt will perform lower body dressing with assistance level of: Independent with assistive device   Problem: RH Toileting Goal: LTG Patient will perform toileting task (3/3 steps) with assistance level (OT) Description: LTG: Patient will perform toileting task (3/3 steps) with assistance level (OT)  Flowsheets (Taken 05/15/2022 1216) LTG: Pt will perform toileting task (3/3 steps) with assistance level: Independent with assistive device   Problem: RH Toilet Transfers Goal: LTG Patient will perform toilet transfers w/assist (OT) Description: LTG: Patient will perform toilet transfers with assist, with/without cues using equipment (OT) Flowsheets (Taken 05/15/2022 1216) LTG: Pt will perform toilet transfers with assistance level of: Independent with assistive device   Problem: RH Tub/Shower Transfers Goal: LTG Patient will perform tub/shower transfers w/assist (OT) Description: LTG: Patient will perform tub/shower transfers with assist, with/without cues using equipment (OT) Flowsheets (Taken 05/15/2022 1216) LTG: Pt will perform tub/shower stall transfers with assistance level of: Supervision/Verbal cueing

## 2022-05-15 NOTE — Progress Notes (Signed)
PROGRESS NOTE   Subjective/Complaints: Pt in bed this AM. No new complaints or concerns today. Reports pain controlled with oxycodone.   Review of Systems  Constitutional:  Negative for chills and fever.  HENT:  Negative for congestion.   Respiratory:  Negative for cough.   Cardiovascular:  Negative for chest pain and palpitations.  Gastrointestinal:  Negative for abdominal pain, diarrhea, heartburn, nausea and vomiting.  Genitourinary: Negative.   Musculoskeletal:  Positive for joint pain.  Neurological: Negative.   Psychiatric/Behavioral: Negative.         Objective:   No results found. Recent Labs    05/15/22 0514  WBC 5.6  HGB 8.1*  HCT 24.2*  PLT 218   Recent Labs    05/15/22 0514  NA 140  K 4.4  CL 105  CO2 29  GLUCOSE 109*  BUN 34*  CREATININE 1.11  CALCIUM 8.7*    Intake/Output Summary (Last 24 hours) at 05/15/2022 1014 Last data filed at 05/15/2022 0740 Gross per 24 hour  Intake 360 ml  Output 1550 ml  Net -1190 ml        Physical Exam: Vital Signs Blood pressure 135/66, pulse 84, temperature 98.2 F (36.8 C), temperature source Oral, resp. rate 16, height 5\' 10"  (1.778 m), weight 88.7 kg, SpO2 98 %.  General: Alert and oriented x 4, No apparent distress HEENT: Head is normocephalic, atraumatic, PERRLA, conjugate gaze, oral mucosa pink and moist Neck: Supple without JVD or lymphadenopathy Heart: Reg rate and rhythm. No murmurs rubs or gallops Chest: CTA bilaterally without wheezes, rales, or rhonchi; no distress Abdomen: Soft, non-tender, non-distended, bowel sounds positive. Extremities: No clubbing, cyanosis, or edema. Pulses are 2+ Psych: Pt's affect is appropriate. 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 Functional deficits which require 3+ hours per day of interdisciplinary therapy in a comprehensive inpatient rehab setting. Physiatrist is providing close team supervision and 24 hour management of active medical problems listed below. Physiatrist and rehab team continue to assess barriers to discharge/monitor patient progress toward functional and medical goals  Care Tool:  Bathing              Bathing assist       Upper Body Dressing/Undressing Upper body dressing        Upper body assist      Lower Body Dressing/Undressing Lower body dressing            Lower body assist       Toileting Toileting    Toileting assist       Transfers Chair/bed transfer  Transfers assist           Locomotion Ambulation   Ambulation assist  Walk 10 feet activity   Assist           Walk 50 feet activity   Assist           Walk 150 feet activity   Assist           Walk 10 feet on uneven surface  activity   Assist           Wheelchair     Assist               Wheelchair 50 feet with 2 turns activity    Assist            Wheelchair 150 feet activity     Assist          Blood pressure 135/66, pulse 84, temperature 98.2 F (36.8 C), temperature source Oral, resp. rate 16, height 5\' 10"  (1.778 m), weight 88.7 kg, SpO2 98 %.  Medical Problem List and Plan: 1. Functional deficits secondary to Right BKA             -patient may not shower             -ELOS/Goals: 7 days, PT/OT supervision to mod I goals             -Continue CIR 2.  Antithrombotics: -DVT/anticoagulation:  Pharmaceutical: Lovenox             -antiplatelet therapy: ASA and  Plavix 3. Pain Management:  Oxycodone 10-15mg  prn.  4. Mood/Behavior/Sleep: LCSW to follow for evaluation and support.              -antipsychotic agents: N/A 5. Neuropsych/cognition: This patient is capable of making decisions on his own behalf. 6. Skin/Wound Care: Continue wound VAC till discharge-->can change over to [facility] if available.  7. Fluids/Electrolytes/Nutrition: Encourage fluid intake.  8. Acute renal failure: BUN/SCr 21/1.1-->40/1.55.  --Had dye study last month but question hypoperfusion.  -Cr down to 1.11 10/5, improved 9. ABLA  -HGB down 8.1 on 10/5, continue to monitor 10. HTN. Continue Irbesartan 300mg , HCTZ 12.5 mg and Amlodipine 2.5mg   -Well controlled, monitor 11. HLD. Continue Lipitor 12. Low Albumin  -Continue protein supplement  -Continue appears to be eating 100% for most meals  LOS: 1 days A FACE TO FACE EVALUATION WAS PERFORMED  Jennye Boroughs 05/15/2022, 10:14 AM

## 2022-05-15 NOTE — Plan of Care (Signed)
  Problem: Consults Goal: RH LIMB LOSS PATIENT EDUCATION Description: Description: See Patient Education module for eduction specifics. Outcome: Progressing Goal: Skin Care Protocol Initiated - if Braden Score 18 or less Description: If consults are not indicated, leave blank or document N/A Outcome: Progressing Goal: Nutrition Consult-if indicated Outcome: Progressing Goal: Diabetes Guidelines if Diabetic/Glucose > 140 Description: If diabetic or lab glucose is > 140 mg/dl - Initiate Diabetes/Hyperglycemia Guidelines & Document Interventions  Outcome: Progressing   Problem: RH BOWEL ELIMINATION Goal: RH STG MANAGE BOWEL WITH ASSISTANCE Description: STG Manage Bowel with Assistance. Outcome: Progressing Goal: RH STG MANAGE BOWEL W/MEDICATION W/ASSISTANCE Description: STG Manage Bowel with Medication with Assistance. Outcome: Progressing   Problem: RH BLADDER ELIMINATION Goal: RH STG MANAGE BLADDER WITH ASSISTANCE Description: STG Manage Bladder With Assistance Outcome: Progressing Goal: RH STG MANAGE BLADDER WITH MEDICATION WITH ASSISTANCE Description: STG Manage Bladder With Medication With Assistance. Outcome: Progressing Goal: RH STG MANAGE BLADDER WITH EQUIPMENT WITH ASSISTANCE Description: STG Manage Bladder With Equipment With Assistance Outcome: Progressing   Problem: RH SKIN INTEGRITY Goal: RH STG SKIN FREE OF INFECTION/BREAKDOWN Outcome: Progressing Goal: RH STG MAINTAIN SKIN INTEGRITY WITH ASSISTANCE Description: STG Maintain Skin Integrity With Assistance. Outcome: Progressing Goal: RH STG ABLE TO PERFORM INCISION/WOUND CARE W/ASSISTANCE Description: STG Able To Perform Incision/Wound Care With Min to Mod Assistance. Outcome: Progressing   Problem: RH SAFETY Goal: RH STG ADHERE TO SAFETY PRECAUTIONS W/ASSISTANCE/DEVICE Description: STG Adhere to Safety Precautions With Assistance/Device. Outcome: Progressing Goal: RH STG DECREASED RISK OF FALL WITH  ASSISTANCE Description: STG Decreased Risk of Fall With Assistance. Outcome: Progressing   Problem: RH PAIN MANAGEMENT Goal: RH STG PAIN MANAGED AT OR BELOW PT'S PAIN GOAL Outcome: Progressing   Problem: RH KNOWLEDGE DEFICIT LIMB LOSS Goal: RH STG INCREASE KNOWLEDGE OF SELF CARE AFTER LIMB LOSS Outcome: Progressing   Problem: Education: Goal: Knowledge of the prescribed therapeutic regimen will improve Outcome: Progressing Goal: Ability to verbalize activity precautions or restrictions will improve Outcome: Progressing Goal: Understanding of discharge needs will improve Outcome: Progressing   Problem: Activity: Goal: Ability to perform//tolerate increased activity and mobilize with assistive devices will improve Outcome: Progressing   Problem: Clinical Measurements: Goal: Postoperative complications will be avoided or minimized Outcome: Progressing   Problem: Self-Care: Goal: Ability to meet self-care needs will improve Outcome: Progressing   Problem: Self-Concept: Goal: Ability to maintain and perform role responsibilities to the fullest extent possible will improve Outcome: Progressing   Problem: Pain Management: Goal: Pain level will decrease with appropriate interventions Outcome: Progressing   Problem: Skin Integrity: Goal: Demonstration of wound healing without infection will improve Outcome: Progressing   Problem: Education: Goal: Understanding of CV disease, CV risk reduction, and recovery process will improve Outcome: Progressing Goal: Individualized Educational Video(s) Outcome: Progressing   Problem: Activity: Goal: Ability to return to baseline activity level will improve Outcome: Progressing   Problem: Cardiovascular: Goal: Ability to achieve and maintain adequate cardiovascular perfusion will improve Outcome: Progressing Goal: Vascular access site(s) Level 0-1 will be maintained Outcome: Progressing   Problem: Health Behavior/Discharge  Planning: Goal: Ability to safely manage health-related needs after discharge will improve Outcome: Progressing

## 2022-05-16 ENCOUNTER — Telehealth: Payer: Self-pay | Admitting: Orthopedic Surgery

## 2022-05-16 ENCOUNTER — Other Ambulatory Visit (HOSPITAL_COMMUNITY): Payer: Self-pay

## 2022-05-16 MED ORDER — ACETAMINOPHEN 325 MG PO TABS
650.0000 mg | ORAL_TABLET | Freq: Three times a day (TID) | ORAL | 0 refills | Status: DC
  Filled 2022-05-16: qty 240, 30d supply, fill #0

## 2022-05-16 MED ORDER — OXYCODONE HCL 5 MG PO TABS
10.0000 mg | ORAL_TABLET | Freq: Four times a day (QID) | ORAL | Status: DC | PRN
  Administered 2022-05-16 – 2022-05-17 (×2): 10 mg via ORAL
  Filled 2022-05-16 (×2): qty 2

## 2022-05-16 MED ORDER — OXYCODONE HCL 10 MG PO TABS
10.0000 mg | ORAL_TABLET | Freq: Four times a day (QID) | ORAL | 0 refills | Status: DC | PRN
  Filled 2022-05-16: qty 28, 7d supply, fill #0

## 2022-05-16 MED ORDER — METHOCARBAMOL 500 MG PO TABS
500.0000 mg | ORAL_TABLET | Freq: Four times a day (QID) | ORAL | 0 refills | Status: DC | PRN
  Filled 2022-05-16: qty 90, 23d supply, fill #0

## 2022-05-16 MED ORDER — TRAMADOL HCL 50 MG PO TABS
50.0000 mg | ORAL_TABLET | Freq: Four times a day (QID) | ORAL | 0 refills | Status: DC | PRN
  Filled 2022-05-16: qty 28, 7d supply, fill #0

## 2022-05-16 MED ORDER — ACETAMINOPHEN 325 MG PO TABS
650.0000 mg | ORAL_TABLET | Freq: Three times a day (TID) | ORAL | Status: DC
  Administered 2022-05-16 – 2022-05-17 (×4): 650 mg via ORAL
  Filled 2022-05-16 (×4): qty 2

## 2022-05-16 MED ORDER — DOCUSATE SODIUM 100 MG PO CAPS
100.0000 mg | ORAL_CAPSULE | Freq: Every day | ORAL | 0 refills | Status: DC
  Filled 2022-05-16: qty 30, 30d supply, fill #0

## 2022-05-16 MED ORDER — TRAMADOL HCL 50 MG PO TABS
50.0000 mg | ORAL_TABLET | Freq: Four times a day (QID) | ORAL | Status: DC | PRN
  Filled 2022-05-16: qty 1

## 2022-05-16 MED ORDER — METHOCARBAMOL 500 MG PO TABS
500.0000 mg | ORAL_TABLET | Freq: Four times a day (QID) | ORAL | Status: DC | PRN
  Administered 2022-05-16: 500 mg via ORAL
  Filled 2022-05-16: qty 1

## 2022-05-16 NOTE — Patient Care Conference (Signed)
Inpatient RehabilitationTeam Conference and Plan of Care Update Date: 05/15/2022   Time: 11:00 AM    Patient Name: Clinton [name]      Medical Record Number: [id number]  Date of Birth: 1956-03-09 Sex: Male         Room/Bed: 4W02C/4W02C-01 Payor Info: Payor: Theme park manager / Plan: Theme park manager OTHER / Product Type: *No Product type* /    Admit Date/Time:  05/14/2022  1:23 PM  Primary Diagnosis:  S/P BKA (below knee amputation), right [facility])  Hospital Problems: Principal Problem:   S/P BKA (below knee amputation), right [facility])    Expected Discharge Date: Expected Discharge Date: 05/17/22  Team Members Present: Physician leading conference: Dr. Jennye Boroughs Social Worker Present: Loralee Pacas, [place] Nurse Present: Dorien Chihuahua, RN PT Present: Ailene Rud, PT OT Present: Willeen Cass, OT;Roanna Epley, COTA     Current Status/Progress Goal Weekly Team Focus  Bowel/Bladder   continent         Swallow/Nutrition/ Hydration             ADL's   close S-min A LB self care, indep after set up UB self care, S toileting, CGA toilet and shower transfers with DME  S/mod I  Family Educ, AE and DME training for d/c   Mobility   supervision-mod I for all mobility, pt's wife participated in hands on family training, ready for d/c  supervision level goals  d/c planning, family training   Communication             Safety/Cognition/ Behavioral Observations            Pain     Muscle spasms   Pain managed with prns   Robaxin q 6 hrs; prn pain med q 4 hrs  Skin     Area left foot; incision right BKA w wound vac   Wound vac off and patient able to manage incision care   Remove wound vac prior to discharge; nutritional supplements for low albumin, educate on skin care    Discharge Planning:  HOme with wife who can provide supervision level has been here for therapies. DME ordered   Team Discussion: Patient is anxious to go home. Staff report doing well. Maintain  wound vac today and remove before discharge.  Patient on target to meet rehab goals: yes  *See Care Plan and progress notes for long and short-term goals.   Revisions to Treatment Plan:  N/a  Teaching Needs: Safety, wound care/incision care, medications, toileting and transfers, etc  Current Barriers to Discharge: Decreased caregiver support and Wound care and weight bearing precautions  Possible Resolutions to Barriers: Family education OP follow up services DME:W/C; already has shower seat and commode in home; wife to purchase TTB and RW on her own    Medical Summary               I attest that I was present, lead the team conference, and concur with the assessment and plan of the team.   Margarito Liner 05/16/2022, 4:46 PM

## 2022-05-16 NOTE — Progress Notes (Addendum)
[facility] Individual Statement of Services  Patient Name:  Clinton [name]  Date:  05/16/2022  Welcome to the [facility].  Our goal is to provide you with an individualized program based on your diagnosis and situation, designed to meet your specific needs.  With this comprehensive rehabilitation program, you will be expected to participate in at least 3 hours of rehabilitation therapies Monday-Friday, with modified therapy programming on the weekends.  Your rehabilitation program will include the following services:  Physical Therapy (PT), Occupational Therapy (OT), 24 hour per day rehabilitation nursing, Care Coordinator, Rehabilitation Medicine, Nutrition Services, and Pharmacy Services  Weekly team conferences will be held on Tuesday to discuss your progress.  Your Inpatient Rehabilitation Care Coordinator will talk with you frequently to get your input and to update you on team discussions.  Team conferences with you and your family in attendance may also be held.  Expected length of stay: 3 days  Overall anticipated outcome: mod/I level  Depending on your progress and recovery, your program may change. Your Inpatient Rehabilitation Care Coordinator will coordinate services and will keep you informed of any changes. Your Inpatient Rehabilitation Care Coordinator's name and contact numbers are listed  below.  The following services may also be recommended but are not provided by the [facility] will be made to provide these services after discharge if needed.  Arrangements include referral to agencies that provide these services.  Your insurance has been verified to be:  [facility] Your primary doctor is:  Dibas Koirala  Pertinent information will be shared with your doctor and your insurance company.  Inpatient  Rehabilitation Care Coordinator:  Ovidio Kin, [place] or Emilia Beck  Information discussed with and copy given to patient by: Elease Hashimoto, 05/16/2022, 9:17 AM

## 2022-05-16 NOTE — Progress Notes (Signed)
Occupational Therapy Session Note  Patient Details  Name: Clinton [name] MRN: [id number] Date of Birth: 1955/09/11  Today's Date: 05/16/2022 OT Individual Time: 1530-1630 OT Individual Time Calculation (min): 60 min    Short Term Goals: Week 1:  OT Short Term Goal 1 (Week 1): STG's=LTG's d/t LOC  Skilled Therapeutic Interventions/Progress Updates:    Upon OT arrival, pt seated in recliner with spouse present. Treatment intervention with a focus on education, discharge planning, quality of life, UE strengthening, functional mobility, and transfers. Pt reports no pain and is agreeable to OT treatment. Discussed plans for discharge and addressed concerns prior to discharge. Pt and spouse both feel relief with acute/inpatient rehab stay and feel prepared for safe discharge home. Pt completes sit to stand transfer with Mod I using RW and ambulates to w/c with Mod I to complete stand to sit transfer with Mod I. Pt was transported outside via w/c and total A for time and completes sit to stand transfer with Mod I using RW and ambulates on uneven surfaces to bench with Mod I and completes stand to sit transfer with Mod I. Pt engages in conversation with this therapist for rest break and completes sit to stand transfer with Mod I using RW to ambulate back to w/c with Mod I and performs stand to sit transfer with Mod I. Pt was transported to fountain and performs B UE exercises for 1x10 reps of each including shoulder flex/ext, elbow flex/ext, shoulder abd/add, scapular prot/retr, supina/pronat, wrist flex/ext. Educated on HEP to perform while home with dumbbell or can of soup for min resistance verbalizing good understanding. Pt was transported back to his room via w/c and total A and request to use the bathroom. Pt completes sit to stand transfer Mod I using RW and ambulates to bathroom completing toilet transfer with Mod I using GB. Pt was left on toilet with spouse present in room and RN aware. Call light  within reach.   Therapy Documentation Precautions:  Precautions Precautions: None Required Braces or Orthoses: Other Brace Other Brace: residual limb protector Restrictions Weight Bearing Restrictions: Yes RLE Weight Bearing: Non weight bearing Other Position/Activity Restrictions: Order set says TDWB but per prior note Dr Sharol Given stated NWB would be best and pt reports NWB   Therapy/Group: Individual Therapy  Marvetta Gibbons 05/16/2022, 5:03 PM

## 2022-05-16 NOTE — Discharge Summary (Signed)
Physician Discharge Summary  Patient ID: Clinton [name] MRN: [id number] DOB/AGE: 1956/03/08 66 y.o.  Admit date: 05/14/2022 Discharge date: 05/17/2022  Discharge Diagnoses:  Principal Problem:   S/P BKA (below knee amputation), right (HCC) Active Problems:   Prediabetes   PAD (peripheral artery disease) (HCC)   Acute blood loss anemia   Post-op pain   Acute renal failure (HCC)   Discharged Condition: stable  Significant Diagnostic Studies: N/A   Labs:  Basic Metabolic Panel:    Latest Ref Rng & Units 05/15/2022    5:14 AM 05/12/2022    1:45 AM 05/11/2022    4:07 AM  BMP  Glucose 70 - 99 mg/dL 462  863  817   BUN 8 - 23 mg/dL 34  40  33   Creatinine 0.61 - 1.24 mg/dL 7.11  6.57  9.03   Sodium 135 - 145 mmol/L 140  135  139   Potassium 3.5 - 5.1 mmol/L 4.4  4.4  4.1   Chloride 98 - 111 mmol/L 105  103  103   CO2 22 - 32 mmol/L 29  25  26    Calcium 8.9 - 10.3 mg/dL 8.7  8.5  8.9      CBC:    Latest Ref Rng & Units 05/15/2022    5:14 AM 05/11/2022    4:07 AM 05/10/2022    3:06 AM  CBC  WBC 4.0 - 10.5 K/uL 5.6  9.4  8.6   Hemoglobin 13.0 - 17.0 g/dL 8.1  9.3  9.8   Hematocrit 39.0 - 52.0 % 24.2  28.0  29.1   Platelets 150 - 400 K/uL 218  303  321      CBG: Recent Labs  Lab 05/15/22 1641  GLUCAP 103*    Brief HPI:   Clinton [name] is a 66 y.o. 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  He continued to have gangrenous changes with severe pain and elected to undergo right-BKA by Dr. 06/16/2022 on 05/09/2022.  Postop labs showed acute renal failure with rise in BUN/serum creatinine to 40/1.55 as well as acute blood loss anemia.  Pain control is improving however he continued to be limited by weakness, pain as well as limb loss. CIR was recommended due to functional decline.    Hospital Course: Clinton [name] was admitted to rehab 05/14/2022 for inpatient therapies to consist of PT and OT at least three hours five days a  week. Past admission physiatrist, therapy team and rehab RN have worked together to provide customized collaborative inpatient rehab.  His blood pressures were monitored on TID basis and have been stable.  Follow-up CBC showed acute blood loss anemia without signs of bleeding.  Follow-up check of electrolytes showed acute renal failure to be resolving.  He was encouraged to increase fluids and recommend follow-up B med/CBC in 1 to 2 weeks on posthospital follow-up.  Pain control has been improving and he has been educated on importance of weaning Oxy as well as use of Robaxin for muscle spasms.  Wound VAC was removed at discharge and incision was reported to be healing well without signs or symptoms of infection.  He made great gains during his short stay and was modified independent at wheelchair level at discharge.  He will continue to receive outpatient PT after discharge.   Rehab course: During patient's stay in rehab brief  team conferences were held to monitor patient's progress, set goals and discuss barriers to  discharge. At admission, patient required min assist with basic ADL tasks admit mobility. He  has had improvement in activity tolerance, balance, postural control as well as ability to compensate for deficits.  He is able to complete ADL tasks at modified independent level. He is independent for transfers and is able to ambulate 100 feet with supervision and verbal cues with use of rolling walker.  Family has been completed with wife.    Discharge disposition: 01-Home or Self Care  Diet: Carb modified  Special Instructions: No driving or strenuous activity till cleared by MD. Recommend repeat BMET  and CBC in 7-10 days to follow up on AKI and ABLA.  Discharge Instructions     Ambulatory referral to Physical Medicine Rehab   Complete by: As directed    Ambulatory referral to Physical Therapy   Complete by: As directed    Eval and treat      Allergies as of 05/17/2022        Reactions   Penicillins Rash   Has patient had a PCN reaction causing immediate rash, facial/tongue/throat swelling, SOB or lightheadedness with hypotension: Yes Has patient had a PCN reaction causing severe rash involving mucus membranes or skin necrosis: No Has patient had a PCN reaction that required hospitalization No Has patient had a PCN reaction occurring within the last 10 years: No If all of the above answers are "NO", then may proceed with Cephalosporin use.   Sulfa Antibiotics Swelling, Other (See Comments)   Facial/lip swelling        Medication List     TAKE these medications    acetaminophen 325 MG tablet Commonly known as: TYLENOL Take 2 tablets (650 mg total) by mouth 4 (four) times daily -  with meals and at bedtime. What changed:  medication strength how much to take when to take this reasons to take this   amLODipine 2.5 MG tablet Commonly known as: NORVASC Take 2.5 mg by mouth at bedtime.   ascorbic acid 1000 MG tablet Commonly known as: VITAMIN C Take 1 tablet (1,000 mg total) by mouth daily.   aspirin EC 81 MG tablet Take 81 mg by mouth at bedtime.   atorvastatin 40 MG tablet Commonly known as: LIPITOR Take 40 mg by mouth at bedtime.   clopidogrel 75 MG tablet Commonly known as: Plavix Take 1 tablet (75 mg total) by mouth daily. What changed: when to take this   docusate sodium 100 MG capsule Commonly known as: COLACE Take 1 capsule (100 mg total) by mouth daily.   irbesartan-hydrochlorothiazide 300-12.5 MG tablet Commonly known as: AVALIDE Take 1 tablet by mouth at bedtime.   methocarbamol 500 MG tablet Commonly known as: ROBAXIN Take 1 tablet (500 mg total) by mouth every 6 (six) hours as needed for muscle spasms.   multivitamin capsule Take 2 capsules by mouth at bedtime. Gummy   nutrition supplement (JUVEN) Pack Take 1 packet by mouth 2 (two) times daily between meals.   Oxycodone HCl 10 MG Tabs--Rx# 28 pills Take 1 tablet  (10 mg total) by mouth every 6 (six) hours as needed for severe pain.   polyethylene glycol 17 g packet Commonly known as: MIRALAX / GLYCOLAX Take 17 g by mouth daily as needed for mild constipation.   sildenafil 100 MG tablet Commonly known as: VIAGRA Take 100 mg by mouth as needed for erectile dysfunction.   traMADol 50 MG tablet--Rx# 28 pills Commonly known as: ULTRAM Take 1 tablet (50 mg total) by mouth 4 (  four) times daily as needed for moderate pain.   zinc sulfate 220 (50 Zn) MG capsule Take 1 capsule (220 mg total) by mouth daily.        Follow-up Information     Koirala, Dibas, MD Follow up.   Specialty: Family Medicine Why: Call in 1-2 days for post hospital follow up Contact information: [place] [address] [phone]         Newt Minion, MD Follow up.   Specialty: Orthopedic Surgery Contact information: [place] [address] [phone]         Courtney Heys, MD Follow up.   Specialty: Physical Medicine and Rehabilitation Why: office will call you with follow up appointment Contact information: [address] [place] [address] [phone]                 Signed: Bary Leriche 05/19/2022, 2:01 AM

## 2022-05-16 NOTE — Progress Notes (Signed)
Inpatient Rehabilitation Discharge Medication Review by a Pharmacist  A complete drug regimen review was completed for this patient to identify any potential clinically significant medication issues.  High Risk Drug Classes Is patient taking? Indication by Medication  Antipsychotic No   Anticoagulant No   Antibiotic No   Opioid Yes Oxycodone- for pain  Antiplatelet Yes ASA, plavix- CVA  Hypoglycemics/insulin No   Vasoactive Medication Yes Amlodipine, Irbesartan /HCTZ-HTN  Chemotherapy No   Other Yes Atorvastatin-HLD Ascorbic acid/ zinc sulfate - wound healing Methocarbamol-muscle spasms Protonix-GERD Tramadol-pain Tylenol -pain Trazodone-sleep Juven- nutritional supplement     Type of Medication Issue Identified Description of Issue Recommendation(s)  Drug Interaction(s) (clinically significant)     Duplicate Therapy     Allergy     No Medication Administration End Date     Incorrect Dose     Additional Drug Therapy Needed     Significant med changes from prior encounter (inform family/care partners about these prior to discharge).    Other       Clinically significant medication issues were identified that warrant physician communication and completion of prescribed/recommended actions by midnight of the next day:  No  Name of provider notified for urgent issues identified:   Provider Method of Notification:   Pharmacist comments:   Time spent performing this drug regimen review (minutes):  [place], [place] Pharmacist [phone] 05/16/2022 2:33 PM  Please check AMION for all [place] phone numbers After 10:00 PM, call [place]

## 2022-05-16 NOTE — Progress Notes (Signed)
Inpatient Rehabilitation Care Coordinator Assessment and Plan Patient Details  Name: Clinton [name] MRN: [id number] Date of Birth: 1956/07/03  Today's Date: 05/16/2022  Hospital Problems: Principal Problem:   S/P BKA (below knee amputation), right [facility])  Past Medical History:  Past Medical History:  Diagnosis Date   Carotid artery occlusion    Cellulitis    Erectile dysfunction    Hyperlipidemia    Hypertension    Peripheral vascular disease ([place])    Past Surgical History:  Past Surgical History:  Procedure Laterality Date   ABDOMINAL AORTOGRAM W/LOWER EXTREMITY N/A 02/04/2022   Procedure: ABDOMINAL AORTOGRAM W/LOWER EXTREMITY;  Surgeon: Serafina Mitchell, MD;  Location: [place] [facility];  Service: Cardiovascular;  Laterality: N/A;   ABDOMINAL AORTOGRAM W/LOWER EXTREMITY N/A 02/18/2022   Procedure: ABDOMINAL AORTOGRAM W/LOWER EXTREMITY;  Surgeon: Serafina Mitchell, MD;  Location: [place] [facility];  Service: Cardiovascular;  Laterality: N/A;   ABDOMINAL AORTOGRAM W/LOWER EXTREMITY N/A 04/15/2022   Procedure: ABDOMINAL AORTOGRAM W/LOWER EXTREMITY;  Surgeon: Serafina Mitchell, MD;  Location: [place] [facility];  Service: Cardiovascular;  Laterality: N/A;   AMPUTATION Right 04/16/2022   Procedure: RIGHT 5TH RAY AMPUTATION;  Surgeon: Newt Minion, MD;  Location: [place];  Service: Orthopedics;  Laterality: Right;   AMPUTATION Right 05/09/2022   Procedure: RIGHT BELOW KNEE AMPUTATION;  Surgeon: Newt Minion, MD;  Location: [place];  Service: Orthopedics;  Laterality: Right;   COLONOSCOPY     ENDARTERECTOMY Right 09/05/2015   Procedure: ENDARTERECTOMY CAROTID RIGHT;  Surgeon: Elam Dutch, MD;  Location: [facility] OR;  Service: Vascular;  Laterality: Right;   PATCH ANGIOPLASTY  09/05/2015   Procedure: PATCH ANGIOPLASTY RIGHT CAROTID ARTERY USING HEMASHIELD PLATINUM FINESSE PATCH;  Surgeon: Elam Dutch, MD;  Location: [place];  Service: Vascular;;   PERIPHERAL VASCULAR ATHERECTOMY Left  02/18/2022   Procedure: PERIPHERAL VASCULAR ATHERECTOMY;  Surgeon: Serafina Mitchell, MD;  Location: [place] [facility];  Service: Cardiovascular;  Laterality: Left;  Popliteal and Peroneal   PERIPHERAL VASCULAR BALLOON ANGIOPLASTY  02/04/2022   Procedure: PERIPHERAL VASCULAR BALLOON ANGIOPLASTY;  Surgeon: Serafina Mitchell, MD;  Location: [place] [facility];  Service: Cardiovascular;;   PERIPHERAL VASCULAR BALLOON ANGIOPLASTY Right 04/15/2022   Procedure: PERIPHERAL VASCULAR BALLOON ANGIOPLASTY;  Surgeon: Serafina Mitchell, MD;  Location: [place] [facility];  Service: Cardiovascular;  Laterality: Right;  SFA/POPLITEAL   TONSILLECTOMY     VASECTOMY     Social History:  reports that he has never smoked. He quit smokeless tobacco use about 7 years ago.  His smokeless tobacco use included chew. He reports that he does not currently use alcohol. He reports that he does not use drugs.  Family / Support Systems Marital Status: Married Patient Roles: Spouse Spouse/Significant Other: Jamas Lav [phone] Other Supports: Friends and neighbors Anticipated Caregiver: marlene and pt Ability/Limitations of Caregiver: Wife works PT 2 days a week she can do supervision pt is high level Caregiver Availability: 24/7 Family Dynamics: Close with family and friends, he feels he has good supports. He is very independent and not one to ask for assist  Social History Preferred language: English Religion: Catholic Cultural Background: No issues Education: [place] - How often do you need to have someone help you when you read instructions, pamphlets, or other written material from your doctor or pharmacy?: Never Writes: Yes Employment Status: Retired Public relations account executive Issues: No issues Guardian/Conservator: None-according to MD pt is capable of making his own  decisions while here   Abuse/Neglect Abuse/Neglect Assessment Can Be Completed: Yes Physical Abuse: Denies Verbal Abuse: Denies Sexual  Abuse: Denies Exploitation of patient/patient's resources: Denies Self-Neglect: Denies  Patient response to: Social Isolation - How often do you feel lonely or isolated from those around you?: Never  Emotional Status Pt's affect, behavior and adjustment status: Pt is motivated to do well and recover and get healed enough to get his prothesis. He is doing well and mod/i from wheelchair and with rolling walker. Feels ready to go home tomorrow. Recent Psychosocial Issues: other health issues-mostley his leg Psychiatric History: No history will be here veyr short time. Seems to be adjusting well and future oriented to getting prothesis once healed Substance Abuse History: No issues  Patient / Family Perceptions, Expectations & Goals Pt/Family understanding of illness & functional limitations: Pt and wife are able to expain his surgery and loss of leg. Both talk with the MD and surgeon and feel they have a good understanding of his plan moving forward. 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, friend Anticipated changes in roles/activities/participation: resume Pt/family expectations/goals: Pt states: " I doing well enough to go home tomorrow.."  Wife states: " He is doing well I knoew he would."  [employer]: None Premorbid Home Care/DME Agencies: Other (Comment) (knee walker, sw, bsc) Transportation available at discharge: wife Is the patient able to respond to transportation needs?: Yes In the past 12 months, has lack of transportation kept you from medical appointments or from getting medications?: No In the past 12 months, has lack of transportation kept you from meetings, work, or from getting things needed for daily living?: No  Discharge Planning Living Arrangements: Spouse/significant other Support Systems: Spouse/significant other, Friends/neighbors, Social worker community Type of Residence: Private residence Insurance Resources: English as a second language teacher (specify) Primary school teacher) Financial Resources: SSD, Family Support Financial Screen Referred: No Living Expenses: Own Money Management: Patient, Spouse Does the patient have any problems obtaining your medications?: No Home Management: both mostly wife with pt's limitations of leg issues Patient/Family Preliminary Plans: Return home with wife who can assist and provide supervision if needed. Wife has been here and observed in therapies. Feels comfortable with discharge home Sat Care Coordinator Anticipated Follow Up Needs: HH/OP  Clinical Impression Pleasant gentleman who is high level and ready to go home. Plan for tomorrow. Wife has been here and comfortable with plan.  Elease Hashimoto 05/16/2022, 9:16 AM

## 2022-05-16 NOTE — Progress Notes (Signed)
PROGRESS NOTE   Subjective/Complaints:   Pt reports wants to leave tomorrow-  Main issue isn't traditional /phantom pain- main issue is muscle spasms.  Said Dr Sharol Given said he didn't need to go home with [facility] and can be removed on day of d/c.   Therapy said he can go home tomorrow- is clear to do so.    ROS:  Pt denies SOB, abd pain, CP, N/V/C/D, and vision changes    Objective:   No results found. Recent Labs    05/15/22 0514  WBC 5.6  HGB 8.1*  HCT 24.2*  PLT 218    Recent Labs    05/15/22 0514  NA 140  K 4.4  CL 105  CO2 29  GLUCOSE 109*  BUN 34*  CREATININE 1.11  CALCIUM 8.7*     Intake/Output Summary (Last 24 hours) at 05/16/2022 0902 Last data filed at 05/16/2022 0700 Gross per 24 hour  Intake 240 ml  Output 1750 ml  Net -1510 ml         Physical Exam: Vital Signs Blood pressure (!) 99/43, pulse 84, temperature 98.1 F (36.7 C), temperature source Oral, resp. rate 16, height 5\' 10"  (1.778 m), weight 88.7 kg, SpO2 98 %.   General: awake, alert, appropriate, sitting up in bed; NAD HENT: conjugate gaze; oropharynx moist CV: regular rate; no JVD Pulmonary: CTA B/L; no W/R/R- good air movement GI: soft, NT, ND, (+)BS Psychiatric: appropriate Neurological: Ox3  Extremities; VAC on R BKA- looks OK- still bulbous Psych: Pt's affect is appropriate. Pt is cooperative, pleasant Skin: Clean and intact without signs of breakdown Neuro:  follows commands, CN 2-12 intact, normal insight and judgement Strength 5/5 in b/l UE and RLE Strength 4+/5 L hip flexion Sensation intact to LT in all 4 extremities Musculoskeletal: R BKA with wound vac in place, no drainage in vac canister, BKA limb protector in place, no ROM deficits noted, normal muscle bulk IV in R dorsal hand was removed recently       Assessment/Plan: 1. Functional deficits which require 3+ hours per day of interdisciplinary therapy  in a comprehensive inpatient rehab setting. Physiatrist is providing close team supervision and 24 hour management of active medical problems listed below. Physiatrist and rehab team continue to assess barriers to discharge/monitor patient progress toward functional and medical goals  Care Tool:  Bathing    Body parts bathed by patient: Right arm, Left arm, Chest, Abdomen, Front perineal area, Buttocks, Right upper leg, Left upper leg, Left lower leg, Face     Body parts n/a: Left lower leg   Bathing assist Assist Level: Minimal Assistance - Patient > 75%     Upper Body Dressing/Undressing Upper body dressing   What is the patient wearing?: Pull over shirt    Upper body assist Assist Level: Set up assist    Lower Body Dressing/Undressing Lower body dressing      What is the patient wearing?: Underwear/pull up, Pants, Ace wrap/stump shrinker     Lower body assist Assist for lower body dressing: Moderate Assistance - Patient 50 - 74%     Toileting Toileting    Toileting assist Assist for toileting: Supervision/Verbal cueing  Transfers Chair/bed transfer  Transfers assist     Chair/bed transfer assist level: Contact Guard/Touching assist     Locomotion Ambulation   Ambulation assist      Assist level: Contact Guard/Touching assist Assistive device: Walker-rolling Max distance: 60 ft   Walk 10 feet activity   Assist     Assist level: Contact Guard/Touching assist Assistive device: Walker-rolling   Walk 50 feet activity   Assist    Assist level: Contact Guard/Touching assist Assistive device: Walker-rolling    Walk 150 feet activity   Assist Walk 150 feet activity did not occur: Safety/medical concerns         Walk 10 feet on uneven surface  activity   Assist Walk 10 feet on uneven surfaces activity did not occur: Safety/medical concerns         Wheelchair     Assist Is the patient using a wheelchair?: Yes Type of  Wheelchair: Manual    Wheelchair assist level: Supervision/Verbal cueing Max wheelchair distance: 120 ft    Wheelchair 50 feet with 2 turns activity    Assist        Assist Level: Supervision/Verbal cueing   Wheelchair 150 feet activity     Assist      Assist Level: Minimal Assistance - Patient > 75%   Blood pressure (!) 99/43, pulse 84, temperature 98.1 F (36.7 C), temperature source Oral, resp. rate 16, height 5\' 10"  (1.778 m), weight 88.7 kg, SpO2 98 %.  Medical Problem List and Plan: 1. Functional deficits secondary to Right BKA             -patient may not shower             -ELOS/Goals: 7 days, PT/OT supervision to mod I goals             -Con't CIR- PT and OT- will d/c tomorrow- need to assess BKA prior to d/c after VAC removed. Make appt with Dr Marciano Sequin- after d/c.  2.  Antithrombotics: -DVT/anticoagulation:  Pharmaceutical: Lovenox             -antiplatelet therapy: ASA and Plavix 3. Pain Management:  Oxycodone 10-15mg  prn.   10/6- Will add Robaxin 500 mg QID prn for muscle spasms- will need f/u with Dr Marciano Sequin 4. Mood/Behavior/Sleep: LCSW to follow for evaluation and support.              -antipsychotic agents: N/A 5. Neuropsych/cognition: This patient is capable of making decisions on his own behalf. 6. Skin/Wound Care: Continue wound VAC till discharge-->can change over to [facility] if available.  7. Fluids/Electrolytes/Nutrition: Encourage fluid intake.  8. Acute renal failure: BUN/SCr 21/1.1-->40/1.55.  --Had dye study last month but question hypoperfusion.  -Cr down to 1.11 10/5, improved 9. ABLA  -HGB down 8.1 on 10/5, continue to monitor 10. HTN. Continue Irbesartan 300mg , HCTZ 12.5 mg and Amlodipine 2.5mg   -Well controlled, monitor 11. HLD. Continue Lipitor 12. Low Albumin  -Continue protein supplement  -Continue appears to be eating 100% for most meals  I spent a total of 35   minutes on total care today- >50% coordination of care- due to IPOC  and d/w PA's about VAC and making sure can remove before to d/c.     LOS: 2 days A FACE TO FACE EVALUATION WAS PERFORMED  Aaisha Sliter 05/16/2022, 9:02 AM

## 2022-05-16 NOTE — IPOC Note (Signed)
Overall Plan of Care [facility]) Patient Details Name: Clinton [name] MRN: [id number] DOB: [birth year]  Admitting Diagnosis: S/P BKA (below knee amputation), right [facility])  Hospital Problems: Principal Problem:   S/P BKA (below knee amputation), right ([place])     Functional Problem List: Nursing Bladder, Bowel, Endurance, Medication Management, Motor, Nutrition, Pain, Safety, Skin Integrity  PT Balance, Pain, Safety, Skin Integrity  OT Balance, Endurance, Pain, Skin Integrity  SLP    TR         Basic ADL's: OT Grooming, Bathing, Dressing, Toileting     Advanced  ADL's: OT Simple Meal Preparation     Transfers: PT Bed Mobility, Bed to Chair, Car  OT Toilet, Tub/Shower     Locomotion: PT Ambulation, Emergency planning/management officer, Stairs     Additional Impairments: OT None  SLP        TR      Anticipated Outcomes Item Anticipated Outcome  Self Feeding indep  Swallowing      Basic self-care  mod I  Toileting  mod I   Bathroom Transfers S/mod I  Bowel/Bladder  continent of bowel and bladder withg min assist  Transfers  supervision transfers  Locomotion  supervision short distance gait  Communication     Cognition     Pain  pain less than or equal to 4/10 with prn medication  Safety/Judgment  free from falls/injury and making appropriate safety decisions with min assist   Therapy Plan: PT Intensity: Minimum of 1-2 x/day ,45 to 90 minutes PT Frequency: 5 out of 7 days PT Duration Estimated Length of Stay: 5-7 days OT Intensity: Minimum of 1-2 x/day, 45 to 90 minutes OT Frequency: 5 out of 7 days OT Duration/Estimated Length of Stay: 5-7 days     Team Interventions: Nursing Interventions Patient/Family Education, Bladder Management, Bowel Management, Pain Management, Skin Care/Wound Management, Medication Management, Discharge Planning  PT interventions Ambulation/gait training, Discharge planning, DME/adaptive equipment instruction, Functional mobility training,  Pain management, Psychosocial support, Therapeutic Activities, UE/LE Strength taining/ROM, Wheelchair propulsion/positioning, UE/LE Coordination activities, Therapeutic Exercise, Stair training, Skin care/wound management, Patient/family education, Neuromuscular re-education, Disease management/prevention, Academic librarian, Training and development officer  OT Interventions Training and development officer, Disease mangement/prevention, Self Care/advanced ADL retraining, Therapeutic Exercise, Wheelchair propulsion/positioning, DME/adaptive equipment instruction, Pain management, Skin care/wound managment, UE/LE Strength taining/ROM, Patient/family education, Splinting/orthotics, Discharge planning, Functional mobility training, Psychosocial support, Therapeutic Activities  SLP Interventions    TR Interventions    SW/CM Interventions Discharge Planning, Psychosocial Support, Patient/Family Education   Barriers to Discharge MD  Medical stability, Home enviroment access/loayout, Wound care, Lack of/limited family support, Weight, and Weight bearing restrictions  Nursing Home environment access/layout, Wound Care, Lack of/limited family support stairs into dwelling, new BKA, spouse works outside home.  PT Inaccessible home environment, Home environment access/layout, Weight bearing restrictions    OT Inaccessible home environment, Home environment access/layout, Wound Care, Weight bearing restrictions wife able to provide S but does work 5 hrs 2x per day, steps to enter home, shower lip to navigate  SLP      SW       Team Discharge Planning: Destination: PT-Home ,OT- Home , SLP-  Projected Follow-up: PT-Outpatient PT, OT-  None, SLP-  Projected Equipment Needs: PT-Wheelchair cushion (measurements), Wheelchair (measurements), Rolling walker with 5" wheels, OT- Rolling walker with 5" wheels, Wheelchair (measurements), Wheelchair cushion (measurements), SLP-  Equipment Details: PT- , OT-may need w/c,  needs RW Patient/family involved in discharge planning: PT- Patient, Family member/caregiver,  OT-Patient, Family member/caregiver, SLP-   MD ELOS:  3-5 days Medical Rehab Prognosis:  Excellent Assessment: The patient has been admitted for CIR therapies with the diagnosis of R BKA. The team will be addressing functional mobility, strength, stamina, balance, safety, adaptive techniques and equipment, self-care, bowel and bladder mgt, patient and caregiver education, preprosthetic training. Goals have been set at mod I to supervision. Anticipated discharge destination is home.        See Team Conference Notes for weekly updates to the plan of care

## 2022-05-16 NOTE — Telephone Encounter (Signed)
FYI Received call from Reesa Chew (PA) she advised if it is ok to remove the wound vac. I spoke with Autumn F. And she advised was ok to remove the wound vac and replace with dry dressing.    [phone]    Phone # to Reesa Chew

## 2022-05-16 NOTE — Progress Notes (Signed)
Occupational Therapy Session Note  Patient Details  Name: Clinton [name] MRN: [id number] Date of Birth: 1956/02/04  Today's Date: 05/16/2022 OT Individual Time: 1045-1200 OT Individual Time Calculation (min): 75 min    Short Term Goals: Week 1:  OT Short Term Goal 1 (Week 1): STG's=LTG's d/t LOC  Skilled Therapeutic Interventions/Progress Updates:    Pt resting in recliner upon arrival. Educated pt and wife on wrapping RLE in plastic bags prior to taking shower. OT intervention with focus on bathing/dressing with sit<>stand from w/c at sink. Pt practiced bed and furniture transfers. Pt practiced TTB transfers. Pt amb in ADL apartment and room with RW. Pt still has wound vac which will be removed prior to discharge. Pt pleased with progress and ready for discharge home tomorrow.   Therapy Documentation Precautions:  Precautions Precautions: None Required Braces or Orthoses: Other Brace Other Brace: residual limb protector Restrictions Weight Bearing Restrictions: Yes RLE Weight Bearing: Non weight bearing Other Position/Activity Restrictions: Order set says TDWB but per prior note Dr Sharol Given stated NWB would be best and pt reports NWB  Pain:  Pt reports 5/10 pain in RLE; meds admin during session and repositioned  Therapy/Group: Individual Therapy  [name] 05/16/2022, 12:09 PM

## 2022-05-16 NOTE — Progress Notes (Signed)
Patient ID: Clinton [name], male   DOB: 1955-09-12, 66 y.o.   MRN: [id number]  Met with pt and wife to discuss follow up PT. Dr. Sharol Given has OPPT in his office have contacted them and have asked Pam-PA to place order in epic they will follow up with once received and contact wife to set up appointment. Both pt and wife feel ready for discharge tomorrow.

## 2022-05-16 NOTE — Progress Notes (Signed)
Inpatient Rehabilitation Care Coordinator Discharge Note DC SAT 10/7  Patient Details  Name: Clinton [name] MRN: [id number] Date of Birth: Feb 10, 1956   Discharge location: HOME WITH WIFE WHO CAN PROVIDE SUPERVISION DOES WORK THURS AND FRIDAY-PART TIME  Length of Stay: 3 DAYS  Discharge activity level: MOD/I LEVEL  Home/community participation: ACTIVE  Patient response FB:PZWCHE Literacy - How often do you need to have someone help you when you read instructions, pamphlets, or other written material from your doctor or pharmacy?: Never  Patient response NI:DPOEUM Isolation - How often do you feel lonely or isolated from those around you?: Never  Services provided included: MD, RD, PT, OT, RN, CM, Pharmacy, SW  Financial Services:  Financial Services Utilized: [place]  Choices offered to/list presented to: PT AND WIFE  Follow-up services arranged:  Outpatient, DME, Patient/Family request agency HH/DME    Outpatient Servicies: [place] [facility]-DR. DUDA OFFICE-OPPT WILL CALL WIFE TO SET UP APPOINTMENT DME : [facility]-WHEELCHAIR WIFE TO GET ROLLING WALKER AND TUB BENCH HH/DME Requested Agency: [facility] MD OFFICE FOR OPPT  Patient response to transportation need: Is the patient able to respond to transportation needs?: Yes In the past 12 months, has lack of transportation kept you from medical appointments or from getting medications?: No In the past 12 months, has lack of transportation kept you from meetings, work, or from getting things needed for daily living?: No    Comments (or additional information):WIFE WAS HERE FOR HANDS ON [place] SAT.   Patient/Family verbalized understanding of follow-up arrangements:  Yes  Individual responsible for coordination of the follow-up plan: MARLENE-WIFE [phone]  Confirmed correct DME delivered: Elease Hashimoto 05/16/2022    Elease Hashimoto

## 2022-05-16 NOTE — Progress Notes (Signed)
Occupational Therapy Discharge Summary  Patient Details  Name: Clinton [name] MRN: [id number] Date of Birth: [birth year]  Date of Discharge from [place] 6, 2023   Patient has met 10 of 10 long term goals due to improved activity tolerance and improved balance. Pt made excellent progress with BADLs and functional transfers during this admission. Pt completes BADLs and functional transfers with RW at mod I level. Pt's wife has been present and participated in therapy session. Pt's wife provides the appropriate level of supervision/assistance. Patient to discharge at overall Supervision level.  Patient's care partner is independent to provide the necessary physical assistance at discharge.    Reasons goals not met: n/a  Recommendation:  Patient will not benefit from ongoing skilled OT services.  Equipment: No equipment provided  Reasons for discharge: treatment goals met  Patient/family agrees with progress made and goals achieved: Yes  OT Discharge ADL ADL Eating: Independent Where Assessed-Eating: Chair Grooming: Independent Where Assessed-Grooming: Sitting at sink Upper Body Bathing: Modified independent Where Assessed-Upper Body Bathing: Wheelchair, Sitting at sink Lower Body Bathing: Modified independent Where Assessed-Lower Body Bathing: Standing at sink, Sitting at sink, Wheelchair Upper Body Dressing: Independent Where Assessed-Upper Body Dressing: Sitting at sink Lower Body Dressing: Modified independent Where Assessed-Lower Body Dressing: Standing at sink, Sitting at sink Toileting: Modified independent Where Assessed-Toileting: Glass blower/designer: Diplomatic Services operational officer Method: Counselling psychologist: Energy manager: Modified independent Clinical cytogeneticist Method: Optometrist: Facilities manager: Unable to assess (OT to assess later session due to time  constraints) ADL Comments: pt has R residual limb wound vac requiring skilled OT assist to manage lines and feeding through garments, pt able to use RW to amb from bed to toilet and to sinkside w/c access with min A. Min A for toileting on regular toilet with grab bars, min A for R residual limb shrinker, donning garments over R LB 1st and for stability in stanidng at RW. Seated UB self care with set up. Shwer stall transfer TBA later visit due to time constraints of full AM self care session and eval. Vision Baseline Vision/History: 1 Wears glasses Patient Visual Report: No change from baseline Vision Assessment?: No apparent visual deficits Perception  Perception: Within Functional Limits Praxis Praxis: Intact Cognition Cognition Overall Cognitive Status: Within Functional Limits for tasks assessed Arousal/Alertness: Awake/alert Orientation Level: Person;Place;Situation Person: Oriented Place: Oriented Situation: Oriented Memory: Appears intact Awareness: Appears intact Problem Solving: Appears intact Safety/Judgment: Appears intact Brief Interview for Mental Status (BIMS) Repetition of Three Words (First Attempt): 3 Temporal Orientation: Year: Correct Temporal Orientation: Month: Accurate within 5 days Temporal Orientation: Day: Correct Recall: "Sock": Yes, no cue required Recall: "Blue": Yes, no cue required Recall: "Bed": Yes, no cue required BIMS Summary Score: 15 Sensation Sensation Light Touch: Appears Intact Hot/Cold: Appears Intact Proprioception: Appears Intact Stereognosis: Appears Intact Coordination Gross Motor Movements are Fluid and Coordinated: Yes Fine Motor Movements are Fluid and Coordinated: Yes Finger Nose Finger Test: WNL Motor  Motor Motor: Within Functional Limits Mobility  Bed Mobility Bed Mobility: Rolling Right;Rolling Left;Supine to Sit Rolling Right: Independent with assistive device Rolling Left: Independent with assistive device Supine  to Sit: Supervision/Verbal cueing Transfers Sit to Stand: Independent with assistive device  Trunk/Postural Assessment  Cervical Assessment Cervical Assessment: Within Functional Limits Thoracic Assessment Thoracic Assessment: Within Functional Limits Lumbar Assessment Lumbar Assessment: Within Functional Limits Postural Control Postural Control: Within Functional Limits  Balance Balance Balance Assessed: Yes Static Sitting Balance  Static Sitting - Balance Support: Feet supported Static Sitting - Level of Assistance: 6: Modified independent (Device/Increase time) Dynamic Sitting Balance Dynamic Sitting - Balance Support: During functional activity Dynamic Sitting - Level of Assistance: 6: Modified independent (Device/Increase time) Static Standing Balance Static Standing - Balance Support: During functional activity Static Standing - Level of Assistance: 6: Modified independent (Device/Increase time) Dynamic Standing Balance Dynamic Standing - Balance Support: During functional activity;Bilateral upper extremity supported Dynamic Standing - Level of Assistance: 5: Stand by assistance Extremity/Trunk Assessment RUE Assessment RUE Assessment: Within Functional Limits LUE Assessment LUE Assessment: Within Functional Limits   [name] 05/16/2022, 12:19 PM

## 2022-05-16 NOTE — TOC Transition Note (Signed)
3 Discharge meds from TOC stored in [facility]. Please bring white sheet from patient shadow chart to main pharmacy for pick up. - [facility] 05/16/2022 

## 2022-05-16 NOTE — Progress Notes (Signed)
Physical Therapy Discharge Summary  Patient Details  Name: Clinton [name] MRN: [id number] Date of Birth: 12-21-1955  Date of Discharge from [facility] 6, 2023  Today's Date: 05/16/2022 PT Individual Time: 0830-0947 PT Individual Time Calculation (min): 77 min    Patient has met 11 of 11 long term goals due to improved activity tolerance, improved balance, and ability to compensate for deficits.  Patient to discharge at a wheelchair level Supervision.   Patient's care partner is independent to provide the necessary physical assistance at discharge.Pt to d/Clinton home with his wife who has undergone hands on family training.  Reasons goals not met: NA  Recommendation:  Patient will benefit from ongoing skilled PT services in outpatient setting to continue to advance safe functional mobility, address ongoing impairments in strength, ROM, compensate for deficits, prepare for and utilize prosthetic, and minimize fall risk.  Equipment: W/Clinton with amputee pad  Reasons for discharge: treatment goals met and discharge from hospital  Patient/family agrees with progress made and goals achieved: Yes  Skilled Therapeutic Interventions/Progress Updates:  pt received in bed and agreeable to therapy. Pt reports pain controlled on medication. Session focused on hands on family training and d/Clinton assessment as documented above and below. Pt performed stair navigation using backwards technique and assist to manage RW. Car transfer with supervision. Discussed time when pt might benefit from CGA although d/Clinton at supervision level, such as unlevel surfaces and curbs. Both pt and wife expressed understanding. Pt also issued HEP of standard amputee exercises to bridge gap to OPPT. At end of session pt requesting to use bathroom. Pt ambulated into bathroom with supervision and doffed clothing in same manner. Pt's wife checked off for gait and transfers, so pt left in his wife's care after discussing gait belt and  alarm use in hospital.   PT Discharge Precautions/Restrictions Precautions Precautions: None Required Braces or Orthoses: Other Brace Other Brace: residual limb protector Restrictions Weight Bearing Restrictions: Yes RLE Weight Bearing: Non weight bearing Other Position/Activity Restrictions: Order set says TDWB but per prior note Dr Sharol Given stated NWB would be best and pt reports NWB Vital Signs  Pain Pain Assessment Pain Scale: 0-10 Pain Score: 7  Pain Type: Surgical pain Pain Location: Leg Pain Orientation: Right Pain Interference Pain Interference Pain Effect on Sleep: 1. Rarely or not at all Pain Interference with Therapy Activities: 1. Rarely or not at all Pain Interference with Day-to-Day Activities: 1. Rarely or not at all Vision/Perception  Vision - History Ability to See in Adequate Light: 0 Adequate Perception Perception: Within Functional Limits Praxis Praxis: Intact  Cognition Overall Cognitive Status: Within Functional Limits for tasks assessed Arousal/Alertness: Awake/alert Orientation Level: Oriented X4 Month: October Day of Week: Correct Memory: Appears intact Awareness: Appears intact Problem Solving: Appears intact Safety/Judgment: Appears intact Sensation   Motor  Motor Motor: Within Functional Limits  Mobility Bed Mobility Bed Mobility: Rolling Right;Rolling Left;Supine to Sit Rolling Right: Independent with assistive device Rolling Left: Independent with assistive device Supine to Sit: Supervision/Verbal cueing Transfers Transfers: Sit to Stand;Stand Pivot Transfers Sit to Stand: Independent with assistive device Stand Pivot Transfers: Independent with assistive device Transfer (Assistive device): Rolling walker Locomotion  Gait Ambulation: Yes Gait Assistance: Supervision/Verbal cueing Gait Distance (Feet): 100 Feet Assistive device: Rolling walker Gait Assistance Details: Verbal cues for safe use of DME/AE;Verbal cues for  precautions/safety Gait Gait: Yes Gait Pattern: Impaired Gait Pattern: Step-to pattern Gait velocity: decreased Stairs / Additional Locomotion Stairs: Yes Stairs Assistance: Contact Guard/Touching assist Stair  Management Technique: Two rails Number of Stairs: 3 Height of Stairs: 6 Ramp: Supervision/Verbal cueing Curb: Supervision/Verbal cueing Wheelchair Mobility Wheelchair Mobility: Yes Wheelchair Assistance: Chartered loss adjuster: Both upper extremities Wheelchair Parts Management: Independent Distance: 300 ft  Trunk/Postural Assessment  Cervical Assessment Cervical Assessment: Within Functional Limits Thoracic Assessment Thoracic Assessment: Within Functional Limits Lumbar Assessment Lumbar Assessment: Within Functional Limits Postural Control Postural Control: Within Functional Limits  Balance Balance Balance Assessed: Yes Static Sitting Balance Static Sitting - Balance Support: Feet supported Static Sitting - Level of Assistance: 6: Modified independent (Device/Increase time) Dynamic Sitting Balance Dynamic Sitting - Balance Support: During functional activity Dynamic Sitting - Level of Assistance: 6: Modified independent (Device/Increase time) Static Standing Balance Static Standing - Balance Support: During functional activity Static Standing - Level of Assistance: 6: Modified independent (Device/Increase time) Dynamic Standing Balance Dynamic Standing - Balance Support: During functional activity;Bilateral upper extremity supported Dynamic Standing - Level of Assistance: 5: Stand by assistance Extremity Assessment      RLE Assessment RLE Assessment: Exceptions to [facility] General Strength Comments: >3+/5 hip and knee. No resistance d/t recent amputation LLE Assessment LLE Assessment: Within Functional Limits General Strength Comments: 5/5, asessed in sitting   Clinton [name] Clinton [name] 05/16/2022, 10:32 AM

## 2022-05-17 NOTE — Progress Notes (Signed)
PROGRESS NOTE   Subjective/Complaints:   VAC removed today. No new concerns or complaints. Pt was discharged home today.  ROS:  Pt denies SOB, abd pain, CP, N/V/C/D, and vision changes    Objective:   No results found. Recent Labs    05/15/22 0514  WBC 5.6  HGB 8.1*  HCT 24.2*  PLT 218    Recent Labs    05/15/22 0514  NA 140  K 4.4  CL 105  CO2 29  GLUCOSE 109*  BUN 34*  CREATININE 1.11  CALCIUM 8.7*     Intake/Output Summary (Last 24 hours) at 05/17/2022 0947 Last data filed at 05/16/2022 1944 Gross per 24 hour  Intake 420 ml  Output 300 ml  Net 120 ml         Physical Exam: Vital Signs Blood pressure (!) 155/68, pulse 87, temperature 98.2 F (36.8 C), temperature source Oral, resp. rate 18, height 5\' 10"  (1.778 m), weight 88.7 kg, SpO2 98 %.   General: awake, alert, appropriate, sitting up in bed; NAD HENT: conjugate gaze; oropharynx moist CV: regular rate; no JVD Pulmonary: CTA B/L; no W/R/R- good air movement GI: soft, NT, ND, (+)BS Psychiatric: appropriate Neurological: Ox3  Extremities; VAC on R BKA- looks OK- still bulbous Psych: Pt's affect is appropriate. Pt is cooperative, pleasant Skin: Clean and intact without signs of breakdown Neuro:  follows commands, CN 2-12 intact, normal insight and judgement Strength 5/5 in b/l UE and RLE Strength 4+/5 L hip flexion Sensation intact to LT in all 4 extremities Musculoskeletal: R BKA incision with staples in place.           Assessment/Plan: 1. Functional deficits which require 3+ hours per day of interdisciplinary therapy in a comprehensive inpatient rehab setting. Physiatrist is providing close team supervision and 24 hour management of active medical problems listed below. Physiatrist and rehab team continue to assess barriers to discharge/monitor patient progress toward functional and medical goals  Care Tool:  Bathing     Body parts bathed by patient: Right arm, Left arm, Chest, Abdomen, Front perineal area, Buttocks, Right upper leg, Left upper leg, Left lower leg, Face     Body parts n/a: Right lower leg   Bathing assist Assist Level: Independent with assistive device     Upper Body Dressing/Undressing Upper body dressing   What is the patient wearing?: Pull over shirt    Upper body assist Assist Level: Independent    Lower Body Dressing/Undressing Lower body dressing      What is the patient wearing?: Underwear/pull up, Pants, Ace wrap/stump shrinker     Lower body assist Assist for lower body dressing: Independent with assitive device     Toileting Toileting    Toileting assist Assist for toileting: Independent with assistive device     Transfers Chair/bed transfer  Transfers assist     Chair/bed transfer assist level: Supervision/Verbal cueing     Locomotion Ambulation   Ambulation assist      Assist level: Supervision/Verbal cueing Assistive device: Walker-rolling Max distance: 100 ft   Walk 10 feet activity   Assist     Assist level: Supervision/Verbal cueing Assistive device: Walker-rolling  Walk 50 feet activity   Assist    Assist level: Supervision/Verbal cueing Assistive device: Walker-rolling    Walk 150 feet activity   Assist Walk 150 feet activity did not occur: Safety/medical concerns  Assist level: Supervision/Verbal cueing Assistive device: Walker-rolling    Walk 10 feet on uneven surface  activity   Assist Walk 10 feet on uneven surfaces activity did not occur: Safety/medical concerns   Assist level: Supervision/Verbal cueing Assistive device: Walker-rolling   Wheelchair     Assist Is the patient using a wheelchair?: Yes Type of Wheelchair: Manual    Wheelchair assist level: Independent Max wheelchair distance: 300 ft    Wheelchair 50 feet with 2 turns activity    Assist        Assist Level: Independent    Wheelchair 150 feet activity     Assist      Assist Level: Independent   Blood pressure (!) 155/68, pulse 87, temperature 98.2 F (36.8 C), temperature source Oral, resp. rate 18, height 5\' 10"  (1.778 m), weight 88.7 kg, SpO2 98 %.  Medical Problem List and Plan: 1. Functional deficits secondary to Right BKA             -patient may not shower             -ELOS/Goals: 7 days, PT/OT supervision to mod I goals             -Con't CIR- PT and OT- will d/c tomorrow- need to assess BKA prior to d/c after VAC removed. Make appt with Dr - after d/c.   -Plan for DC home today  -Reviewed incision as wound Vac was coming off, staples intact, no signs of infection, planned to review after wound vac completely off but he was discharged when I can back, reviewed images no signs of infection noted, f/u with Dr. Benjie Karvonen 10/12 2.  Antithrombotics: -DVT/anticoagulation:  Pharmaceutical: Lovenox             -antiplatelet therapy: ASA and Plavix 3. Pain Management:  Oxycodone 10-15mg  prn.   10/6- Will add Robaxin 500 mg QID prn for muscle spasms- will need f/u with Dr 12/6 4. Mood/Behavior/Sleep: LCSW to follow for evaluation and support.              -antipsychotic agents: N/A 5. Neuropsych/cognition: This patient is capable of making decisions on his own behalf. 6. Skin/Wound Care: Continue wound VAC till discharge-->can change over to [facility] if available.  7. Fluids/Electrolytes/Nutrition: Encourage fluid intake.  8. Acute renal failure: BUN/SCr 21/1.1-->40/1.55.  --Had dye study last month but question hypoperfusion.  -Cr down to 1.11 10/5, improved -f/u with PCP after discharge,recheck as outpatient 9. ABLA  -HGB down 8.1 on 10/5, continue to monitor  -F/u with PCP as outpatient for recheck 10. HTN. Continue Irbesartan 300mg , HCTZ 12.5 mg and Amlodipine 2.5mg   -Intermittently a little elevated, f/u with PCP for continued monitoring 11. HLD. Continue Lipitor 12. Low Albumin  -Continue  protein supplement  -Continue appears to be eating 100% for most meals   LOS: 3 days A FACE TO FACE EVALUATION WAS PERFORMED  09-04-1969 05/17/2022, 9:47 AM

## 2022-05-17 NOTE — Progress Notes (Signed)
Patient discharged this shift, with spouse by side. Wound vac removed as ordered and  staples intact and shrinker  applied as ordered. Medication pickup from pharm and given to patient and spouse. Patient and spouse voices understanding of discharge instruction

## 2022-05-17 NOTE — Progress Notes (Signed)
Wound vac was not removed as no discharge order is in for patient. See order

## 2022-05-19 ENCOUNTER — Encounter (HOSPITAL_COMMUNITY): Payer: 59

## 2022-05-19 ENCOUNTER — Telehealth: Payer: Self-pay | Admitting: Orthopedic Surgery

## 2022-05-19 ENCOUNTER — Encounter: Payer: 59 | Admitting: Surgery

## 2022-05-19 DIAGNOSIS — D62 Acute posthemorrhagic anemia: Secondary | ICD-10-CM

## 2022-05-19 DIAGNOSIS — N179 Acute kidney failure, unspecified: Secondary | ICD-10-CM

## 2022-05-19 DIAGNOSIS — G8918 Other acute postprocedural pain: Secondary | ICD-10-CM

## 2022-05-19 NOTE — Telephone Encounter (Signed)
Pt wife informed

## 2022-05-19 NOTE — Telephone Encounter (Signed)
Pt's wife Jamas Lav called stating that the PA where her husband goes for physical therapy recommended that he need probiotics and they need to know which kind to get. Please call pt's wife at [phone].

## 2022-05-20 ENCOUNTER — Telehealth: Payer: Self-pay

## 2022-05-20 NOTE — Telephone Encounter (Signed)
Pt informed. They just had the question of how often to clean stump. I advised once a day.

## 2022-05-20 NOTE — Telephone Encounter (Signed)
Patient's wife Jamas Lav called wanting to know about cleaning patients stump.  Cb# [phone].  Please advise.  Thank you.

## 2022-05-22 ENCOUNTER — Ambulatory Visit (INDEPENDENT_AMBULATORY_CARE_PROVIDER_SITE_OTHER): Payer: 59 | Admitting: Orthopedic Surgery

## 2022-05-22 DIAGNOSIS — Z89511 Acquired absence of right leg below knee: Secondary | ICD-10-CM

## 2022-05-22 DIAGNOSIS — S88111A Complete traumatic amputation at level between knee and ankle, right lower leg, initial encounter: Secondary | ICD-10-CM

## 2022-05-23 ENCOUNTER — Telehealth: Payer: Self-pay | Admitting: Orthopedic Surgery

## 2022-05-23 ENCOUNTER — Encounter: Payer: Self-pay | Admitting: Orthopedic Surgery

## 2022-05-23 NOTE — Progress Notes (Signed)
Office Visit Note   Patient: Clinton [name]           Date of Birth: 1956/04/24           MRN: [id number] Visit Date: 05/22/2022              Requested by: Lujean Amel, MD [address] [place],  [place] [address] PCP: Lujean Amel, MD  Chief Complaint  Patient presents with   Right Leg - Routine Post Op    05/09/2022 right BKA kerecis graft       HPI: Patient is a 66 year old gentleman who is 2 weeks status post right below-knee amputation with application of Kerecis tissue graft.  He is currently in a 4 XL shrinker and a limb protector.  Patient has developed an abrasion from the limb protractor proximately.  Assessment & Plan: Visit Diagnoses:  1. Below-knee amputation of right lower extremity ([place])     Plan: Patient provided prescription for prosthesis and a 2 XL shrinker.  Harvest staples at follow-up.  Follow-Up Instructions: Return in about 1 week (around 05/29/2022).   Ortho Exam  Patient is alert, oriented, no adenopathy, well-dressed, normal affect, normal respiratory effort. Examination there is swelling the wound is well approximated no redness no cellulitis no drainage.  He has a blister approximately lateral on the thigh from impingement from the limb protector.  His calf measures 39 cm in circumference thigh is 45 cm in circumference consistent with a 2 XL shrinker.  Patient is a new right transtibial  amputee.  Patient's current comorbidities are not expected to impact the ability to function with the prescribed prosthesis. Patient verbally communicates a strong desire to use a prosthesis. Patient currently requires mobility aids to ambulate without a prosthesis.  Expects not to use mobility aids with a new prosthesis.  Patient is a K3 level ambulator that spends a lot of time walking around on uneven terrain over obstacles, up and down stairs, and ambulates with a variable cadence.     Imaging: No results found. No images  are attached to the encounter.  Labs: Lab Results  Component Value Date   HGBA1C 5.4 04/15/2022   ESRSEDRATE 14 04/08/2022   CRP 2.1 04/08/2022     Lab Results  Component Value Date   ALBUMIN 2.3 (L) 05/15/2022   ALBUMIN 3.9 08/27/2015    No results found for: "MG" No results found for: "VD25OH"  No results found for: "PREALBUMIN"    Latest Ref Rng & Units 05/15/2022    5:14 AM 05/11/2022    4:07 AM 05/10/2022    3:06 AM  CBC EXTENDED  WBC 4.0 - 10.5 K/uL 5.6  9.4  8.6   RBC 4.22 - 5.81 MIL/uL 2.55  2.88  3.07   Hemoglobin 13.0 - 17.0 g/dL 8.1  9.3  9.8   HCT 39.0 - 52.0 % 24.2  28.0  29.1   Platelets 150 - 400 K/uL 218  303  321   NEUT# 1.7 - 7.7 K/uL 3.6     Lymph# 0.7 - 4.0 K/uL 1.3        There is no height or weight on file to calculate BMI.  Orders:  No orders of the defined types were placed in this encounter.  No orders of the defined types were placed in this encounter.    Procedures: No procedures performed  Clinical Data: No additional findings.  ROS:  All other systems negative, except as noted in the HPI.  Review of Systems  Objective: Vital Signs: There were no vitals taken for this visit.  Specialty Comments:  No specialty comments available.  PMFS History: Patient Active Problem List   Diagnosis Date Noted   Acute blood loss anemia 05/19/2022   Post-op pain 05/19/2022   Acute renal failure (HCC) 05/19/2022   S/P BKA (below knee amputation), right (HCC) 05/14/2022   Below-knee amputation of right lower extremity (HCC) 05/09/2022   Gangrene of right foot (HCC)    PAD (peripheral artery disease) (HCC) 04/17/2022   Acute osteomyelitis of metatarsal bone of right foot (HCC)    Atherosclerosis of native arteries of the extremities with ulceration (HCC) 04/15/2022   Peripheral arterial disease (HCC) 03/25/2022   Obesity 03/21/2022   Prediabetes 03/21/2022   Erectile dysfunction 07/26/2021   Family history of ischemic heart disease  (IHD) 07/26/2021   Pure hypercholesterolemia 07/26/2021   History of adenomatous polyp of colon 02/03/2019   Internal hemorrhoids 02/03/2019   Carotid artery stenosis, asymptomatic 09/05/2015   Cellulitis    Hypertension    Hyperlipidemia    Occlusion and stenosis of carotid artery without mention of cerebral infarction 01/01/2012   Past Medical History:  Diagnosis Date   Carotid artery occlusion    Cellulitis    Erectile dysfunction    Hyperlipidemia    Hypertension    Peripheral vascular disease (HCC)     Family History  Problem Relation Age of Onset   Other Mother        circulatory problems   Heart attack Mother    Deep vein thrombosis Mother    Heart disease Mother    Hyperlipidemia Father    Hypertension Father    Diabetes Father    Deep vein thrombosis Father    Heart disease Father        before age 3   Heart attack Father    Peripheral vascular disease Father    Hypertension Brother    Hyperlipidemia Brother     Past Surgical History:  Procedure Laterality Date   ABDOMINAL AORTOGRAM W/LOWER EXTREMITY N/A 02/04/2022   Procedure: ABDOMINAL AORTOGRAM W/LOWER EXTREMITY;  Surgeon: Nada Libman, MD;  [facility];  Service: Cardiovascular;  Laterality: N/A;   ABDOMINAL AORTOGRAM W/LOWER EXTREMITY N/A 02/18/2022   Procedure: ABDOMINAL AORTOGRAM W/LOWER EXTREMITY;  Surgeon: Nada Libman, MD;  [facility];  Service: Cardiovascular;  Laterality: N/A;   ABDOMINAL AORTOGRAM W/LOWER EXTREMITY N/A 04/15/2022   Procedure: ABDOMINAL AORTOGRAM W/LOWER EXTREMITY;  Surgeon: Nada Libman, MD;  [facility];  Service: Cardiovascular;  Laterality: N/A;   AMPUTATION Right 04/16/2022   Procedure: RIGHT 5TH RAY AMPUTATION;  Surgeon: Nadara Mustard, MD;  Location: [facility] OR;  Service: Orthopedics;  Laterality: Right;   AMPUTATION Right 05/09/2022   Procedure: RIGHT BELOW KNEE AMPUTATION;  Surgeon: Nadara Mustard, MD;  Location: [facility] OR;   Service: Orthopedics;  Laterality: Right;   COLONOSCOPY     ENDARTERECTOMY Right 09/05/2015   Procedure: ENDARTERECTOMY CAROTID RIGHT;  Surgeon: Sherren Kerns, MD;  Location: [facility] OR;  Service: Vascular;  Laterality: Right;   PATCH ANGIOPLASTY  09/05/2015   Procedure: PATCH ANGIOPLASTY RIGHT CAROTID ARTERY USING HEMASHIELD PLATINUM FINESSE PATCH;  Surgeon: Sherren Kerns, MD;  Location[facility];  Service: Vascular;;   PERIPHERAL VASCULAR ATHERECTOMY Left 02/18/2022   Procedure: PERIPHERAL VASCULAR ATHERECTOMY;  Surgeon: Nada Libman, MD;  [facility];  Service: Cardiovascular;  Laterality: Left;  Popliteal and Peroneal   PERIPHERAL VASCULAR BALLOON ANGIOPLASTY  02/04/2022   Procedure: PERIPHERAL VASCULAR BALLOON ANGIOPLASTY;  Surgeon: Nada Libman, MD;  [facility];  Service: Cardiovascular;;   PERIPHERAL VASCULAR BALLOON ANGIOPLASTY Right 04/15/2022   Procedure: PERIPHERAL VASCULAR BALLOON ANGIOPLASTY;  Surgeon: Nada Libman, MD;  [facility];  Service: Cardiovascular;  Laterality: Right;  SFA/POPLITEAL   TONSILLECTOMY     VASECTOMY     Social History   Occupational History   Not on file  Tobacco Use   Smoking status: Never   Smokeless tobacco: Former    Types: Chew    Quit date: 01/01/2015  Vaping Use   Vaping Use: Never used  Substance and Sexual Activity   Alcohol use: Not Currently   Drug use: No   Sexual activity: Not on file

## 2022-05-23 NOTE — Telephone Encounter (Signed)
Pt's wife called asking for a call back from [employer]. Asking if pt can start using a scooter. Phone number is [phone]

## 2022-05-26 NOTE — Telephone Encounter (Signed)
Pt is 2 weeks s/p BKA and advised that he may not sue the scooter for ambulation that this will put too much pressure on the limb at this time. Vocied understanding and will call with any other  questions.

## 2022-05-29 ENCOUNTER — Ambulatory Visit (INDEPENDENT_AMBULATORY_CARE_PROVIDER_SITE_OTHER): Payer: 59 | Admitting: Orthopedic Surgery

## 2022-05-29 DIAGNOSIS — S88111A Complete traumatic amputation at level between knee and ankle, right lower leg, initial encounter: Secondary | ICD-10-CM

## 2022-05-29 DIAGNOSIS — Z89511 Acquired absence of right leg below knee: Secondary | ICD-10-CM

## 2022-06-03 ENCOUNTER — Encounter: Payer: Self-pay | Admitting: Orthopedic Surgery

## 2022-06-03 NOTE — Progress Notes (Signed)
Office Visit Note   Patient: Clinton [name]           Date of Birth: [birth year]           MRN: [id number] Visit Date: 05/29/2022              Requested by: Darrow Bussing, MD [address] [place],  [place] [address] PCP: Darrow Bussing, MD  Chief Complaint  Patient presents with   Right Leg - Routine Post Op    05/09/2022 right BKA kerecis graft       HPI: Patient is a 66 year old gentleman who is 3 weeks status post right below-knee amputation with Kerecis tissue graft.  Assessment & Plan: Visit Diagnoses:  1. Below-knee amputation of right lower extremity (HCC)     Plan: Staples are harvested patient provided a application for handicap parking.  Continue with the stump shrinker.  Follow-Up Instructions: Return in about 2 weeks (around 06/12/2022).   Ortho Exam  Patient is alert, oriented, no adenopathy, well-dressed, normal affect, normal respiratory effort. Examination the incision is well approximated there is no signs of infection Staples harvested.  Patient is a new right transtibial  amputee.  Patient's current comorbidities are not expected to impact the ability to function with the prescribed prosthesis. Patient verbally communicates a strong desire to use a prosthesis. Patient currently requires mobility aids to ambulate without a prosthesis.  Expects not to use mobility aids with a new prosthesis.  Patient is a K3 level ambulator that spends a lot of time walking around on uneven terrain over obstacles, up and down stairs, and ambulates with a variable cadence.    Imaging: No results found.     Labs: Lab Results  Component Value Date   HGBA1C 5.4 04/15/2022   ESRSEDRATE 14 04/08/2022   CRP 2.1 04/08/2022     Lab Results  Component Value Date   ALBUMIN 2.3 (L) 05/15/2022   ALBUMIN 3.9 08/27/2015    No results found for: "MG" No results found for: "VD25OH"  No results found for: "PREALBUMIN"    Latest Ref Rng &  Units 05/15/2022    5:14 AM 05/11/2022    4:07 AM 05/10/2022    3:06 AM  CBC EXTENDED  WBC 4.0 - 10.5 K/uL 5.6  9.4  8.6   RBC 4.22 - 5.81 MIL/uL 2.55  2.88  3.07   Hemoglobin 13.0 - 17.0 g/dL 8.1  9.3  9.8   HCT 35.4 - 52.0 % 24.2  28.0  29.1   Platelets 150 - 400 K/uL 218  303  321   NEUT# 1.7 - 7.7 K/uL 3.6     Lymph# 0.7 - 4.0 K/uL 1.3        There is no height or weight on file to calculate BMI.  Orders:  No orders of the defined types were placed in this encounter.  No orders of the defined types were placed in this encounter.    Procedures: No procedures performed  Clinical Data: No additional findings.  ROS:  All other systems negative, except as noted in the HPI. Review of Systems  Objective: Vital Signs: There were no vitals taken for this visit.  Specialty Comments:  No specialty comments available.  PMFS History: Patient Active Problem List   Diagnosis Date Noted   Acute blood loss anemia 05/19/2022   Post-op pain 05/19/2022   Acute renal failure (HCC) 05/19/2022   S/P BKA (below knee amputation), right (HCC) 05/14/2022   Below-knee  amputation of right lower extremity (HCC) 05/09/2022   Gangrene of right foot (HCC)    PAD (peripheral artery disease) (HCC) 04/17/2022   Acute osteomyelitis of metatarsal bone of right foot (HCC)    Atherosclerosis of native arteries of the extremities with ulceration (HCC) 04/15/2022   Peripheral arterial disease (HCC) 03/25/2022   Obesity 03/21/2022   Prediabetes 03/21/2022   Erectile dysfunction 07/26/2021   Family history of ischemic heart disease (IHD) 07/26/2021   Pure hypercholesterolemia 07/26/2021   History of adenomatous polyp of colon 02/03/2019   Internal hemorrhoids 02/03/2019   Carotid artery stenosis, asymptomatic 09/05/2015   Cellulitis    Hypertension    Hyperlipidemia    Occlusion and stenosis of carotid artery without mention of cerebral infarction 01/01/2012   Past Medical History:  Diagnosis  Date   Carotid artery occlusion    Cellulitis    Erectile dysfunction    Hyperlipidemia    Hypertension    Peripheral vascular disease (HCC)     Family History  Problem Relation Age of Onset   Other Mother        circulatory problems   Heart attack Mother    Deep vein thrombosis Mother    Heart disease Mother    Hyperlipidemia Father    Hypertension Father    Diabetes Father    Deep vein thrombosis Father    Heart disease Father        before age 55   Heart attack Father    Peripheral vascular disease Father    Hypertension Brother    Hyperlipidemia Brother     Past Surgical History:  Procedure Laterality Date   ABDOMINAL AORTOGRAM W/LOWER EXTREMITY N/A 02/04/2022   Procedure: ABDOMINAL AORTOGRAM W/LOWER EXTREMITY;  Surgeon: Nada Libman, MD;  [facility];  Service: Cardiovascular;  Laterality: N/A;   ABDOMINAL AORTOGRAM W/LOWER EXTREMITY N/A 02/18/2022   Procedure: ABDOMINAL AORTOGRAM W/LOWER EXTREMITY;  Surgeon: Nada Libman, MD;  [facility];  Service: Cardiovascular;  Laterality: N/A;   ABDOMINAL AORTOGRAM W/LOWER EXTREMITY N/A 04/15/2022   Procedure: ABDOMINAL AORTOGRAM W/LOWER EXTREMITY;  Surgeon: Nada Libman, MD;  [facility];  Service: Cardiovascular;  Laterality: N/A;   AMPUTATION Right 04/16/2022   Procedure: RIGHT 5TH RAY AMPUTATION;  Surgeon: Nadara Mustard, MD;  Location: [facility] OR;  Service: Orthopedics;  Laterality: Right;   AMPUTATION Right 05/09/2022   Procedure: RIGHT BELOW KNEE AMPUTATION;  Surgeon: Nadara Mustard, MD;  Location: [facility] OR;  Service: Orthopedics;  Laterality: Right;   COLONOSCOPY     ENDARTERECTOMY Right 09/05/2015   Procedure: ENDARTERECTOMY CAROTID RIGHT;  Surgeon: Sherren Kerns, MD;  Location: [facility] OR;  Service: Vascular;  Laterality: Right;   PATCH ANGIOPLASTY  09/05/2015   Procedure: PATCH ANGIOPLASTY RIGHT CAROTID ARTERY USING HEMASHIELD PLATINUM FINESSE PATCH;  Surgeon: Sherren Kerns, MD;  Location: [facility] OR;  Service: Vascular;;   PERIPHERAL VASCULAR ATHERECTOMY Left 02/18/2022   Procedure: PERIPHERAL VASCULAR ATHERECTOMY;  Surgeon: Nada Libman, MD;  [facility];  Service: Cardiovascular;  Laterality: Left;  Popliteal and Peroneal   PERIPHERAL VASCULAR BALLOON ANGIOPLASTY  02/04/2022   Procedure: PERIPHERAL VASCULAR BALLOON ANGIOPLASTY;  Surgeon: Nada Libman, MD;  [facility];  Service: Cardiovascular;;   PERIPHERAL VASCULAR BALLOON ANGIOPLASTY Right 04/15/2022   Procedure: PERIPHERAL VASCULAR BALLOON ANGIOPLASTY;  Surgeon: Nada Libman, MD;  [facility];  Service: Cardiovascular;  Laterality: Right;  SFA/POPLITEAL   TONSILLECTOMY     VASECTOMY     Social History   Occupational History   Not on file  Tobacco Use   Smoking status: Never   Smokeless tobacco: Former    Types: Chew    Quit date: 01/01/2015  Vaping Use   Vaping Use: Never used  Substance and Sexual Activity   Alcohol use: Not Currently   Drug use: No   Sexual activity: Not on file

## 2022-06-16 ENCOUNTER — Encounter: Payer: 59 | Admitting: Orthopedic Surgery

## 2022-06-16 ENCOUNTER — Encounter: Payer: 59 | Attending: Physical Medicine and Rehabilitation | Admitting: Physical Medicine and Rehabilitation

## 2022-06-16 ENCOUNTER — Encounter: Payer: Self-pay | Admitting: Physical Medicine and Rehabilitation

## 2022-06-16 VITALS — BP 124/71 | HR 58

## 2022-06-16 DIAGNOSIS — S88111A Complete traumatic amputation at level between knee and ankle, right lower leg, initial encounter: Secondary | ICD-10-CM | POA: Diagnosis not present

## 2022-06-16 DIAGNOSIS — G546 Phantom limb syndrome with pain: Secondary | ICD-10-CM | POA: Insufficient documentation

## 2022-06-16 DIAGNOSIS — G8918 Other acute postprocedural pain: Secondary | ICD-10-CM | POA: Diagnosis not present

## 2022-06-16 NOTE — Patient Instructions (Addendum)
Pt is a 66 yr old male with hx of R BKA late 9/23; with HTN, HLD, low albumin-  Here for hospital for R BKA f/u   Suggest MTP/metatarsal pad- on L foot- to cover entire base of foot-  make sure doesn't make shoe too tight.   2.  Discussed- no dressing - so has drainage from middle- where it's slightly open- con't [employer]   3. Con't to follow as needed  4. Educated on energy expenditure- 40% more with BKA; 100% more with AKA.    5. Discussed not wearing limb guard regularly- only for transfers/car ride- - until has Gerald Stabs from Museum/gallery curator to [employer] it!  6. F/u- 9 months- BKA

## 2022-06-16 NOTE — Progress Notes (Signed)
Subjective:    Patient ID: Clinton [name], male    DOB: [birth year], 66 y.o.   MRN: [id number]  HPI Pt is a 66 yr old male with hx of R BKA late 9/23; with HTN, HLD, low albumin-  Here for hospital f/u.   No problems whatsoever-  Phantom pain- but is tolerable.   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  Healing well- - met with Pro[facility]- last Monday- and next meeting next Monday. Just has a couple of scabs.   Hasn't taken a picture since before amputation.   BP and everything doing well  Has callus on bottom of L fot- uncomfortable.   Last A1c 5.4   Pain Inventory Average Pain 0 Pain Right Now 0 My pain is  no pain  In the last 24 hours, has pain interfered with the following? General activity 0 Relation with others 0 Enjoyment of life 0 What TIME of day is your pain at its worst? varies Sleep (in general) Fair  Pain is worse with:  no pain Pain improves with:  no pain Relief from Meds:  no pain  use a walker ability to climb steps?  no do you drive?  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  No problems in this area  Hospital f/u  Hospital f/u    Family History  Problem Relation Age of Onset   Other Mother        circulatory problems   Heart attack Mother    Deep vein thrombosis Mother    Heart disease Mother    Hyperlipidemia Father    Hypertension Father    Diabetes Father    Deep vein thrombosis Father    Heart disease Father        before age 59   Heart attack Father    Peripheral vascular disease Father    Hypertension Brother    Hyperlipidemia Brother    Social History   Socioeconomic History   Marital status: Married    Spouse name: Not on file   Number of children: Not on file   Years of education: Not on file   Highest education level: Not on file  Occupational History   Not on file  Tobacco Use   Smoking status: Never   Smokeless tobacco:  Former    Types: Chew    Quit date: 01/01/2015  Vaping Use   Vaping Use: Never used  Substance and Sexual Activity   Alcohol use: Not Currently   Drug use: No   Sexual activity: Not on file  Other Topics Concern   Not on file  Social History Narrative   Not on file   Social Determinants of Health   Financial Resource Strain: Not on file  Food Insecurity: No Food Insecurity (05/10/2022)   Hunger Vital Sign    Worried About Running Out of Food in the Last Year: Never true    Ran Out of Food in the Last Year: Never true  Transportation Needs: No Transportation Needs (05/10/2022)   PRAPARE - Hydrologist (Medical): No    Lack of Transportation (Non-Medical): No  Physical Activity: Not on file  Stress: Not on file  Social Connections: Not on file   Past Surgical History:  Procedure Laterality Date   ABDOMINAL AORTOGRAM W/LOWER EXTREMITY N/A 02/04/2022   Procedure: ABDOMINAL AORTOGRAM W/LOWER EXTREMITY;  Surgeon: Trula Slade,  Butch Penny, MD;  Location: [place] [facility];  Service: Cardiovascular;  Laterality: N/A;   ABDOMINAL AORTOGRAM W/LOWER EXTREMITY N/A 02/18/2022   Procedure: ABDOMINAL AORTOGRAM W/LOWER EXTREMITY;  Surgeon: Serafina Mitchell, MD;  Location: [place] [facility];  Service: Cardiovascular;  Laterality: N/A;   ABDOMINAL AORTOGRAM W/LOWER EXTREMITY N/A 04/15/2022   Procedure: ABDOMINAL AORTOGRAM W/LOWER EXTREMITY;  Surgeon: Serafina Mitchell, MD;  Location: [place] [facility];  Service: Cardiovascular;  Laterality: N/A;   AMPUTATION Right 04/16/2022   Procedure: RIGHT 5TH RAY AMPUTATION;  Surgeon: Newt Minion, MD;  Location: [place];  Service: Orthopedics;  Laterality: Right;   AMPUTATION Right 05/09/2022   Procedure: RIGHT BELOW KNEE AMPUTATION;  Surgeon: Newt Minion, MD;  Location: [place];  Service: Orthopedics;  Laterality: Right;   COLONOSCOPY     ENDARTERECTOMY Right 09/05/2015   Procedure: ENDARTERECTOMY CAROTID RIGHT;  Surgeon: Elam Dutch,  MD;  Location: [facility] OR;  Service: Vascular;  Laterality: Right;   PATCH ANGIOPLASTY  09/05/2015   Procedure: PATCH ANGIOPLASTY RIGHT CAROTID ARTERY USING HEMASHIELD PLATINUM FINESSE PATCH;  Surgeon: Elam Dutch, MD;  Location: [place];  Service: Vascular;;   PERIPHERAL VASCULAR ATHERECTOMY Left 02/18/2022   Procedure: PERIPHERAL VASCULAR ATHERECTOMY;  Surgeon: Serafina Mitchell, MD;  Location: [place] [facility];  Service: Cardiovascular;  Laterality: Left;  Popliteal and Peroneal   PERIPHERAL VASCULAR BALLOON ANGIOPLASTY  02/04/2022   Procedure: PERIPHERAL VASCULAR BALLOON ANGIOPLASTY;  Surgeon: Serafina Mitchell, MD;  Location: [place] [facility];  Service: Cardiovascular;;   PERIPHERAL VASCULAR BALLOON ANGIOPLASTY Right 04/15/2022   Procedure: PERIPHERAL VASCULAR BALLOON ANGIOPLASTY;  Surgeon: Serafina Mitchell, MD;  Location: [place] [facility];  Service: Cardiovascular;  Laterality: Right;  SFA/POPLITEAL   TONSILLECTOMY     VASECTOMY     Past Medical History:  Diagnosis Date   Carotid artery occlusion    Cellulitis    Erectile dysfunction    Hyperlipidemia    Hypertension    Peripheral vascular disease (HCC)    BP 124/71   Pulse (!) 58   SpO2 98%   Opioid Risk Score:   Fall Risk Score:  `1  Depression screen [facility] 2/9     04/08/2022    3:30 PM  Depression screen [facility] 2/9  Decreased Interest 0  Down, Depressed, Hopeless 0  PHQ - 2 Score 0     Review of Systems    An entire ROS completed and found to be negative except for HPI Objective:   Physical Exam  Awake, alert, appropriate, accompanied by wife, NAD Has 'bump" on bottom of L ball of foot.  But is actually is lack of padding under MTP bases Also is starting to get mild curling of toes on L foot Wearing preshrinker over Dr Donna Christen And has skin top layer removed on back of R thigh from limb guard       Assessment & Plan:   Pt is a 66 yr old male with hx of R BKA late 9/23; with HTN, HLD, low albumin-  Here  for hospital for R BKA f/u   Suggest MTP/metatarsal pad- on L foot- to cover entire base of foot-  make sure doesn't make shoe too tight.   2.  Discussed- no dressing - so has drainage from middle- where it's slightly open- con't [employer]   3. Con't to follow as needed  4. Educated on energy expenditure- 40% more with BKA; 100% more with AKA.  5. Discussed not wearing limb guard regularly- only for transfers/car ride- - until has Gerald Stabs from Museum/gallery curator to [employer] it!  6. F/u- 9 months- BKA   I spent a total of  26  minutes on total care today- >50% coordination of care- due to discussion and viewing BKA and L foot- and energy expenditure.

## 2022-06-17 ENCOUNTER — Encounter: Payer: 59 | Admitting: Physical Therapy

## 2022-06-19 ENCOUNTER — Encounter: Payer: 59 | Admitting: Orthopedic Surgery

## 2022-06-19 ENCOUNTER — Encounter: Payer: Self-pay | Admitting: Orthopedic Surgery

## 2022-06-19 ENCOUNTER — Ambulatory Visit (INDEPENDENT_AMBULATORY_CARE_PROVIDER_SITE_OTHER): Payer: 59 | Admitting: Orthopedic Surgery

## 2022-06-19 ENCOUNTER — Other Ambulatory Visit: Payer: Self-pay

## 2022-06-19 ENCOUNTER — Encounter (HOSPITAL_COMMUNITY): Payer: Self-pay | Admitting: Orthopedic Surgery

## 2022-06-19 DIAGNOSIS — T8781 Dehiscence of amputation stump: Secondary | ICD-10-CM

## 2022-06-19 MED ORDER — DOXYCYCLINE HYCLATE 100 MG PO TABS: 100.0000 mg | ORAL_TABLET | Freq: Two times a day (BID) | ORAL | 0 refills | Status: DC

## 2022-06-19 NOTE — Progress Notes (Signed)
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  Patient states he started having drainage over the anterior aspect of the tibia.  Assessment & Plan: Visit Diagnoses:  1. Dehiscence of amputation stump (HCC)     Plan: We will call in a prescription for doxycycline.  We will plan for debridement of the wound dehiscence right transtibial amputation tomorrow placement of tissue graft and placement of a wound VAC.  Follow-Up Instructions: Return in about 1 week (around 06/26/2022).   Ortho Exam  Patient is alert, oriented, no adenopathy, well-dressed, normal affect, normal respiratory effort. Examination the surgical incision is healing well there is good consolidation at the shape.  Patient has a small open wound over the anterior tibia the wound is 5 mm in diameter and 5 mm deep probing to bone.  There is clear drainage there is no cellulitis.  Imaging: No results found. No images are attached to the encounter.  Labs: Lab Results  Component Value Date   HGBA1C 5.4 04/15/2022   ESRSEDRATE 14 04/08/2022   CRP 2.1 04/08/2022     Lab Results  Component Value Date   ALBUMIN 2.3 (L) 05/15/2022   ALBUMIN 3.9 08/27/2015    No results found for: "MG" No results found for: "VD25OH"  No results found for: "PREALBUMIN"    Latest Ref Rng & Units 05/15/2022    5:14 AM 05/11/2022    4:07 AM 05/10/2022    3:06 AM  CBC EXTENDED  WBC 4.0 - 10.5 K/uL 5.6  9.4  8.6   RBC 4.22 - 5.81 MIL/uL 2.55  2.88  3.07   Hemoglobin  13.0 - 17.0 g/dL 8.1  9.3  9.8   HCT 01.0 - 52.0 % 24.2  28.0  29.1   Platelets 150 - 400 K/uL 218  303  321   NEUT# 1.7 - 7.7 K/uL 3.6     Lymph# 0.7 - 4.0 K/uL 1.3        There is no height or weight on file to calculate BMI.  Orders:  No orders of the defined types were placed in this encounter.  No orders of the defined types were placed in this encounter.    Procedures: No procedures performed  Clinical Data: No additional findings.  ROS:  All other systems negative, except as noted in the HPI. Review of Systems  Objective: Vital Signs: There were no vitals taken for this visit.  Specialty Comments:  No specialty comments available.  PMFS History: Patient Active Problem List   Diagnosis Date Noted   Acute blood loss anemia 05/19/2022   Post-op pain 05/19/2022   Acute renal failure (HCC) 05/19/2022   S/P BKA (below knee amputation), right (HCC) 05/14/2022   Below-knee amputation of right lower extremity (HCC) 05/09/2022   Gangrene of right foot (HCC)    PAD (peripheral  artery disease) (HCC) 04/17/2022   Acute osteomyelitis of metatarsal bone of right foot (HCC)    Atherosclerosis of native arteries of the extremities with ulceration (HCC) 04/15/2022   Peripheral arterial disease (HCC) 03/25/2022   Obesity 03/21/2022   Prediabetes 03/21/2022   Erectile dysfunction 07/26/2021   Family history of ischemic heart disease (IHD) 07/26/2021   Pure hypercholesterolemia 07/26/2021   History of adenomatous polyp of colon 02/03/2019   Internal hemorrhoids 02/03/2019   Carotid artery stenosis, asymptomatic 09/05/2015   Cellulitis    Hypertension    Hyperlipidemia    Occlusion and stenosis of carotid artery without mention of cerebral infarction 01/01/2012   Past Medical History:  Diagnosis Date   Carotid artery occlusion    Cellulitis    Erectile dysfunction    Hyperlipidemia    Hypertension    Peripheral vascular disease (HCC)     Family History  Problem  Relation Age of Onset   Other Mother        circulatory problems   Heart attack Mother    Deep vein thrombosis Mother    Heart disease Mother    Hyperlipidemia Father    Hypertension Father    Diabetes Father    Deep vein thrombosis Father    Heart disease Father        before age 74   Heart attack Father    Peripheral vascular disease Father    Hypertension Brother    Hyperlipidemia Brother     Past Surgical History:  Procedure Laterality Date   ABDOMINAL AORTOGRAM W/LOWER EXTREMITY N/A 02/04/2022   Procedure: ABDOMINAL AORTOGRAM W/LOWER EXTREMITY;  Surgeon: Nada Libman, MD;  [facility];  Service: Cardiovascular;  Laterality: N/A;   ABDOMINAL AORTOGRAM W/LOWER EXTREMITY N/A 02/18/2022   Procedure: ABDOMINAL AORTOGRAM W/LOWER EXTREMITY;  Surgeon: Nada Libman, MD;  Location: [facility];  Service: Cardiovascular;  Laterality: N/A;   ABDOMINAL AORTOGRAM W/LOWER EXTREMITY N/A 04/15/2022   Procedure: ABDOMINAL AORTOGRAM W/LOWER EXTREMITY;  Surgeon: Nada Libman, MD;  [facility]: [facility];  Service: Cardiovascular;  Laterality: N/A;   AMPUTATION Right 04/16/2022   Procedure: RIGHT 5TH RAY AMPUTATION;  Surgeon: Nadara Mustard, MD;  Location[facility] OR;  Service: Orthopedics;  Laterality: Right;   AMPUTATION Right 05/09/2022   Procedure: RIGHT BELOW KNEE AMPUTATION;  Surgeon: Nadara Mustard, MD;  Location: [facility] OR;  Service: Orthopedics;  Laterality: Right;   COLONOSCOPY     ENDARTERECTOMY Right 09/05/2015   Procedure: ENDARTERECTOMY CAROTID RIGHT;  Surgeon: Sherren Kerns, MD;  Location: [facility] OR;  Service: Vascular;  Laterality: Right;   PATCH ANGIOPLASTY  09/05/2015   Procedure: PATCH ANGIOPLASTY RIGHT CAROTID ARTERY USING HEMASHIELD PLATINUM FINESSE PATCH;  Surgeon: Sherren Kerns, MD;  Location: [facility] OR;  Service: Vascular;;   PERIPHERAL VASCULAR ATHERECTOMY Left 02/18/2022   Procedure: PERIPHERAL VASCULAR ATHERECTOMY;  Surgeon: Nada Libman, MD;   [facility];  Service: Cardiovascular;  Laterality: Left;  Popliteal and Peroneal   PERIPHERAL VASCULAR BALLOON ANGIOPLASTY  02/04/2022   Procedure: PERIPHERAL VASCULAR BALLOON ANGIOPLASTY;  Surgeon: Nada Libman, MD;  [facility];  Service: Cardiovascular;;   PERIPHERAL VASCULAR BALLOON ANGIOPLASTY Right 04/15/2022   Procedure: PERIPHERAL VASCULAR BALLOON ANGIOPLASTY;  Surgeon: Nada Libman, MD;  [facility];  Service: Cardiovascular;  Laterality: Right;  SFA/POPLITEAL   TONSILLECTOMY     VASECTOMY     Social History   Occupational  History   Not on file  Tobacco Use   Smoking status: Never   Smokeless tobacco: Former    Types: Chew    Quit date: 01/01/2015  Vaping Use   Vaping Use: Never used  Substance and Sexual Activity   Alcohol use: Not Currently   Drug use: No   Sexual activity: Not on file

## 2022-06-19 NOTE — Progress Notes (Signed)
Clinton [name] denies chest pain or shortness of breath. Patient denies having any s/s of Covid in her household, also denies any known exposure to Covid.

## 2022-06-20 ENCOUNTER — Encounter (HOSPITAL_COMMUNITY)
Admission: RE | Disposition: A | Discharge status: Home / Self Care | Payer: Self-pay | Source: Home / Self Care | Attending: Orthopedic Surgery

## 2022-06-20 ENCOUNTER — Ambulatory Visit (HOSPITAL_BASED_OUTPATIENT_CLINIC_OR_DEPARTMENT_OTHER): Payer: 59 | Admitting: Anesthesiology

## 2022-06-20 ENCOUNTER — Other Ambulatory Visit: Payer: Self-pay

## 2022-06-20 ENCOUNTER — Encounter (HOSPITAL_COMMUNITY): Payer: Self-pay | Admitting: Orthopedic Surgery

## 2022-06-20 ENCOUNTER — Ambulatory Visit (HOSPITAL_COMMUNITY)
Admission: RE | Admit: 2022-06-20 | Discharge: 2022-06-20 | Disposition: A | Discharge status: Home / Self Care | Payer: 59 | Attending: Orthopedic Surgery | Admitting: Orthopedic Surgery

## 2022-06-20 ENCOUNTER — Ambulatory Visit (HOSPITAL_COMMUNITY): Payer: 59 | Admitting: Anesthesiology

## 2022-06-20 DIAGNOSIS — I10 Essential (primary) hypertension: Secondary | ICD-10-CM

## 2022-06-20 DIAGNOSIS — T8781 Dehiscence of amputation stump: Secondary | ICD-10-CM | POA: Diagnosis present

## 2022-06-20 DIAGNOSIS — Z87891 Personal history of nicotine dependence: Secondary | ICD-10-CM | POA: Diagnosis not present

## 2022-06-20 DIAGNOSIS — Z7902 Long term (current) use of antithrombotics/antiplatelets: Secondary | ICD-10-CM | POA: Insufficient documentation

## 2022-06-20 DIAGNOSIS — Z89511 Acquired absence of right leg below knee: Secondary | ICD-10-CM | POA: Diagnosis not present

## 2022-06-20 DIAGNOSIS — D649 Anemia, unspecified: Secondary | ICD-10-CM

## 2022-06-20 HISTORY — PX: STUMP REVISION: SHX6102

## 2022-06-20 HISTORY — PX: APPLICATION OF WOUND VAC: SHX5189

## 2022-06-20 LAB — CBC WITH DIFFERENTIAL/PLATELET
Abs Immature Granulocytes: 0.02 10*3/uL (ref 0.00–0.07)
Basophils Absolute: 0 10*3/uL (ref 0.0–0.1)
Basophils Relative: 1 %
Eosinophils Absolute: 0.1 10*3/uL (ref 0.0–0.5)
Eosinophils Relative: 1 %
HCT: 36.3 % — ABNORMAL LOW (ref 39.0–52.0)
Hemoglobin: 12.2 g/dL — ABNORMAL LOW (ref 13.0–17.0)
Immature Granulocytes: 0 %
Lymphocytes Relative: 23 %
Lymphs Abs: 1.5 10*3/uL (ref 0.7–4.0)
MCH: 31.7 pg (ref 26.0–34.0)
MCHC: 33.6 g/dL (ref 30.0–36.0)
MCV: 94.3 fL (ref 80.0–100.0)
Monocytes Absolute: 0.5 10*3/uL (ref 0.1–1.0)
Monocytes Relative: 8 %
Neutro Abs: 4.2 10*3/uL (ref 1.7–7.7)
Neutrophils Relative %: 67 %
Platelets: 231 10*3/uL (ref 150–400)
RBC: 3.85 MIL/uL — ABNORMAL LOW (ref 4.22–5.81)
RDW: 13 % (ref 11.5–15.5)
WBC: 6.3 10*3/uL (ref 4.0–10.5)
nRBC: 0 % (ref 0.0–0.2)

## 2022-06-20 LAB — COMPREHENSIVE METABOLIC PANEL
ALT: 23 U/L (ref 0–44)
AST: 20 U/L (ref 15–41)
Albumin: 3.6 g/dL (ref 3.5–5.0)
Alkaline Phosphatase: 61 U/L (ref 38–126)
Anion gap: 8 (ref 5–15)
BUN: 17 mg/dL (ref 8–23)
CO2: 25 mmol/L (ref 22–32)
Calcium: 9.5 mg/dL (ref 8.9–10.3)
Chloride: 107 mmol/L (ref 98–111)
Creatinine, Ser: 1.11 mg/dL (ref 0.61–1.24)
GFR, Estimated: >60 mL/min (ref >60)
Glucose, Bld: 113 mg/dL — ABNORMAL HIGH (ref 70–99)
Potassium: 4.3 mmol/L (ref 3.5–5.1)
Sodium: 140 mmol/L (ref 135–145)
Total Bilirubin: 0.8 mg/dL (ref 0.3–1.2)
Total Protein: 6.2 g/dL — ABNORMAL LOW (ref 6.5–8.1)

## 2022-06-20 SURGERY — REVISION, AMPUTATION SITE
Anesthesia: Regional | Laterality: Right

## 2022-06-20 MED ORDER — PROPOFOL 10 MG/ML IV BOLUS
INTRAVENOUS | Status: AC
  Filled 2022-06-20: qty 20

## 2022-06-20 MED ORDER — ACETAMINOPHEN 500 MG PO TABS
1000.0000 mg | ORAL_TABLET | Freq: Once | ORAL | Status: AC
  Administered 2022-06-20: 1000 mg via ORAL
  Filled 2022-06-20: qty 2

## 2022-06-20 MED ORDER — PROPOFOL 10 MG/ML IV BOLUS
INTRAVENOUS | Status: DC | PRN
  Administered 2022-06-20: 200 mg via INTRAVENOUS

## 2022-06-20 MED ORDER — LIDOCAINE 2% (20 MG/ML) 5 ML SYRINGE
INTRAMUSCULAR | Status: DC | PRN
  Administered 2022-06-20: 40 mg via INTRAVENOUS

## 2022-06-20 MED ORDER — FENTANYL CITRATE (PF) 100 MCG/2ML IJ SOLN
INTRAMUSCULAR | Status: AC
  Filled 2022-06-20: qty 2

## 2022-06-20 MED ORDER — CEFAZOLIN SODIUM-DEXTROSE 2-4 GM/100ML-% IV SOLN
2.0000 g | INTRAVENOUS | Status: AC
  Administered 2022-06-20: 2 g via INTRAVENOUS
  Filled 2022-06-20: qty 100

## 2022-06-20 MED ORDER — 0.9 % SODIUM CHLORIDE (POUR BTL) OPTIME
TOPICAL | Status: DC | PRN
  Administered 2022-06-20: 1000 mL

## 2022-06-20 MED ORDER — PHENYLEPHRINE 80 MCG/ML (10ML) SYRINGE FOR IV PUSH (FOR BLOOD PRESSURE SUPPORT)
PREFILLED_SYRINGE | INTRAVENOUS | Status: AC
  Filled 2022-06-20: qty 10

## 2022-06-20 MED ORDER — MIDAZOLAM HCL 2 MG/2ML IJ SOLN: 2.0000 mg | Freq: Once | INTRAMUSCULAR | Status: AC

## 2022-06-20 MED ORDER — OXYCODONE HCL 5 MG PO TABS
ORAL_TABLET | ORAL | Status: AC
  Filled 2022-06-20: qty 1

## 2022-06-20 MED ORDER — ROPIVACAINE HCL 5 MG/ML IJ SOLN: INTRAMUSCULAR | Status: DC | PRN

## 2022-06-20 MED ORDER — ONDANSETRON HCL 4 MG/2ML IJ SOLN
INTRAMUSCULAR | Status: AC
  Filled 2022-06-20: qty 2

## 2022-06-20 MED ORDER — LACTATED RINGERS IV SOLN: INTRAVENOUS | Status: DC

## 2022-06-20 MED ORDER — OXYCODONE HCL 5 MG PO TABS: 10.0000 mg | ORAL_TABLET | Freq: Four times a day (QID) | ORAL | 0 refills | Status: DC | PRN

## 2022-06-20 MED ORDER — MIDAZOLAM HCL 2 MG/2ML IJ SOLN
INTRAMUSCULAR | Status: AC
  Administered 2022-06-20: 2 mg via INTRAVENOUS
  Filled 2022-06-20: qty 2

## 2022-06-20 MED ORDER — CHLORHEXIDINE GLUCONATE 0.12 % MT SOLN
15.0000 mL | Freq: Once | OROMUCOSAL | Status: AC
  Administered 2022-06-20: 15 mL via OROMUCOSAL
  Filled 2022-06-20: qty 15

## 2022-06-20 MED ORDER — FENTANYL CITRATE (PF) 100 MCG/2ML IJ SOLN
25.0000 ug | INTRAMUSCULAR | Status: DC | PRN
  Administered 2022-06-20 (×2): 50 ug via INTRAVENOUS

## 2022-06-20 MED ORDER — PHENYLEPHRINE 80 MCG/ML (10ML) SYRINGE FOR IV PUSH (FOR BLOOD PRESSURE SUPPORT)
PREFILLED_SYRINGE | INTRAVENOUS | Status: DC | PRN
  Administered 2022-06-20 (×2): 80 ug via INTRAVENOUS

## 2022-06-20 MED ORDER — ONDANSETRON HCL 4 MG/2ML IJ SOLN
INTRAMUSCULAR | Status: DC | PRN
  Administered 2022-06-20: 4 mg via INTRAVENOUS

## 2022-06-20 MED ORDER — ORAL CARE MOUTH RINSE: 15.0000 mL | Freq: Once | OROMUCOSAL | Status: AC

## 2022-06-20 MED ORDER — ROPIVACAINE HCL 5 MG/ML IJ SOLN
INTRAMUSCULAR | Status: DC | PRN
  Administered 2022-06-20: 15 mL via PERINEURAL
  Administered 2022-06-20: 25 mL via PERINEURAL

## 2022-06-20 MED ORDER — OXYCODONE HCL 5 MG/5ML PO SOLN: 5.0000 mg | Freq: Once | ORAL | Status: AC | PRN

## 2022-06-20 MED ORDER — PROMETHAZINE HCL 25 MG/ML IJ SOLN: 6.2500 mg | INTRAMUSCULAR | Status: DC | PRN

## 2022-06-20 MED ORDER — OXYCODONE HCL 5 MG PO TABS
5.0000 mg | ORAL_TABLET | Freq: Once | ORAL | Status: AC | PRN
  Administered 2022-06-20: 5 mg via ORAL

## 2022-06-20 SURGICAL SUPPLY — 33 items
BAG COUNTER SPONGE SURGICOUNT (BAG) ×2 IMPLANT
BAG SPNG CNTER NS LX DISP (BAG) ×1
BLADE SAW RECIP 87.9 MT (BLADE) IMPLANT
BLADE SURG 21 STRL SS (BLADE) ×2 IMPLANT
CANISTER WOUND CARE 500ML ATS (WOUND CARE) ×2 IMPLANT
COVER SURGICAL LIGHT HANDLE (MISCELLANEOUS) ×2 IMPLANT
DRAPE DERMATAC (DRAPES) IMPLANT
DRAPE EXTREMITY T 121X128X90 (DISPOSABLE) ×2 IMPLANT
DRAPE HALF SHEET 40X57 (DRAPES) ×2 IMPLANT
DRAPE INCISE IOBAN 66X45 STRL (DRAPES) ×2 IMPLANT
DRAPE U-SHAPE 47X51 STRL (DRAPES) ×4 IMPLANT
DRESSING PREVENA PLUS CUSTOM (GAUZE/BANDAGES/DRESSINGS) ×2 IMPLANT
DRSG PREVENA PLUS CUSTOM (GAUZE/BANDAGES/DRESSINGS) ×1
DURAPREP 26ML APPLICATOR (WOUND CARE) ×2 IMPLANT
ELECT REM PT RETURN 9FT ADLT (ELECTROSURGICAL) ×1
ELECTRODE REM PT RTRN 9FT ADLT (ELECTROSURGICAL) ×2 IMPLANT
GLOVE BIOGEL PI IND STRL 9 (GLOVE) ×2 IMPLANT
GLOVE SURG ORTHO 9.0 STRL STRW (GLOVE) ×2 IMPLANT
GOWN STRL REUS W/ TWL XL LVL3 (GOWN DISPOSABLE) ×4 IMPLANT
GOWN STRL REUS W/TWL XL LVL3 (GOWN DISPOSABLE) ×2
GRAFT SKIN WND MICRO 38 (Tissue) IMPLANT
KIT BASIN OR (CUSTOM PROCEDURE TRAY) ×2 IMPLANT
KIT TURNOVER KIT B (KITS) ×2 IMPLANT
MANIFOLD NEPTUNE II (INSTRUMENTS) ×2 IMPLANT
NS IRRIG 1000ML POUR BTL (IV SOLUTION) ×2 IMPLANT
PACK GENERAL/GYN (CUSTOM PROCEDURE TRAY) ×2 IMPLANT
PAD ARMBOARD 7.5X6 YLW CONV (MISCELLANEOUS) ×2 IMPLANT
PREVENA RESTOR ARTHOFORM 46X30 (CANNISTER) ×2 IMPLANT
STAPLER VISISTAT 35W (STAPLE) IMPLANT
SUT ETHILON 2 0 PSLX (SUTURE) ×4 IMPLANT
SUT SILK 2 0 (SUTURE)
SUT SILK 2-0 18XBRD TIE 12 (SUTURE) IMPLANT
TOWEL GREEN STERILE (TOWEL DISPOSABLE) ×2 IMPLANT

## 2022-06-20 NOTE — Transfer of Care (Signed)
Immediate Anesthesia Transfer of Care Note  Patient: Clinton [name]  Procedure(s) Performed: REVISION RIGHT BELOW KNEE AMPUTATION (Right) APPLICATION OF WOUND VAC (Right)  Patient Location: PACU  Anesthesia Type:GA combined with regional for post-op pain  Level of Consciousness: awake, alert , and oriented  Airway & Oxygen Therapy: Patient Spontanous Breathing and Patient connected to nasal cannula oxygen  Post-op Assessment: Report given to RN, Post -op Vital signs reviewed and stable, Patient moving all extremities X 4, and Patient able to stick tongue midline  Post vital signs: Reviewed  Last Vitals:  Vitals Value Taken Time  BP 125/68 06/20/22 1526  Temp 97.6   Pulse 53 06/20/22 1528  Resp 9 06/20/22 1528  SpO2 100 % 06/20/22 1528  Vitals shown include unvalidated device data.  Last Pain:  Vitals:   06/20/22 1250  TempSrc:   PainSc: 0-No pain         Complications: No notable events documented.

## 2022-06-20 NOTE — Interval H&P Note (Signed)
History and Physical Interval Note:  06/20/2022 11:26 AM  Clinton [name]  has presented today for surgery, with the diagnosis of Dehiscence Right Below Knee Amputation.  The various methods of treatment have been discussed with the patient and family. After consideration of risks, benefits and other options for treatment, the patient has consented to  Procedure(s): REVISION RIGHT BELOW KNEE AMPUTATION (Right) as a surgical intervention.  The patient's history has been reviewed, patient examined, no change in status, stable for surgery.  I have reviewed the patient's chart and labs.  Questions were answered to the patient's satisfaction.     Nadara Mustard

## 2022-06-20 NOTE — Anesthesia Procedure Notes (Signed)
Anesthesia Regional Block: Popliteal block   Pre-Anesthetic Checklist: , timeout performed,  Correct Patient, Correct Site, Correct Laterality,  Correct Procedure, Correct Position, site marked,  Risks and benefits discussed,  Surgical consent,  Pre-op evaluation,  At surgeon's request and post-op pain management  Laterality: Right  Prep: chloraprep       Needles:  Injection technique: Single-shot  Needle Type: Echogenic Stimulator Needle     Needle Length: 9cm  Needle Gauge: 21     Additional Needles:   Procedures:,,,, ultrasound used (permanent image in chart),,    Narrative:  Start time: 06/20/2022 12:36 PM End time: 06/20/2022 12:39 PM Injection made incrementally with aspirations every 5 mL.  Performed by: Personally  Anesthesiologist: Linton Rump, MD  Additional Notes: Discussed risks and benefits of nerve block including, but not limited to, prolonged and/or permanent nerve injury involving sensory and/or motor function. Monitors were applied and a time-out was performed. The nerve and associated structures were visualized under ultrasound guidance. After negative aspiration, local anesthetic was slowly injected around the nerve. There was no evidence of high pressure during the procedure. There were no paresthesias. VSS remained stable and the patient tolerated the procedure well.

## 2022-06-20 NOTE — Op Note (Signed)
06/20/2022  4:24 PM  PATIENT:  Clinton [name]    PRE-OPERATIVE DIAGNOSIS:  Dehiscence Right Below Knee Amputation  POST-OPERATIVE DIAGNOSIS:  Same  PROCEDURE:  REVISION RIGHT BELOW KNEE AMPUTATION, APPLICATION OF WOUND VAC Application of Kerecis micro powder 38 cm.  SURGEON:  Nadara Mustard, MD  PHYSICIAN ASSISTANT:None ANESTHESIA:   General  PREOPERATIVE INDICATIONS:  Clinton [name] is a  66 y.o. male with a diagnosis of Dehiscence Right Below Knee Amputation who failed conservative measures and elected for surgical management.    The risks benefits and alternatives were discussed with the patient preoperatively including but not limited to the risks of infection, bleeding, nerve injury, cardiopulmonary complications, the need for revision surgery, among others, and the patient was willing to proceed.  OPERATIVE IMPLANTS: Micro powder 38 cm.  @ENCIMAGES @  OPERATIVE FINDINGS: Patient had ischemic tissue this was sent for cultures.  Patient is currently on doxycycline.  Tissue margins were clear at the time of closure.  OPERATIVE PROCEDURE: Patient was brought the operating room underwent a general anesthetic.  After adequate levels anesthesia were obtained patient's right lower extremity was prepped using DuraPrep draped into a sterile field a timeout was called.  A fishmouth incision was made around the ulcerative tissue this was carried down to bone.  The posterior flap had ischemic tissue margins medially and posteriorly.  This was resected back to bleeding viable healthy granulation tissue.  The distal tibia and fibula were resected 1 cm with the anterior aspect beveled.  Electrocautery was used for hemostasis the wound was irrigated with normal saline the Kerecis micro powder was applied to the wound bed this was closed with 2-0 nylon.  A [name] customizable and form wound VAC was applied this had a good suction fit patient was taken the PACU in stable  condition.   DISCHARGE PLANNING:  Antibiotic duration: Continue oral antibiotics adjust according to tissue cultures  Weightbearing: Nonweightbearing on the right  Pain medication: Prescription for oxycodone  Dressing care/ Wound VAC: Continue wound VAC  Ambulatory devices: Walker  Discharge to: home  Follow-up: In the office 1 week post operative.

## 2022-06-20 NOTE — Anesthesia Preprocedure Evaluation (Addendum)
Anesthesia Evaluation  Patient identified by MRN, date of birth, ID band Patient awake    Reviewed: Allergy & Precautions, NPO status , Patient's Chart, lab work & pertinent test results  History of Anesthesia Complications Negative for: history of anesthetic complications  Airway Mallampati: III  TM Distance: >3 FB Neck ROM: Full    Dental no notable dental hx.    Pulmonary neg shortness of breath, neg sleep apnea, neg COPD, neg recent URI, former smoker   Pulmonary exam normal breath sounds clear to auscultation       Cardiovascular hypertension (amlodipine, irbesartan-HCTZ), Pt. on medications (-) angina + Peripheral Vascular Disease  (-) Past MI and (-) Cardiac Stents + dysrhythmias (RBBB, LAFB)  Rhythm:Regular Rate:Normal  Carotid artery occlusion   Neuro/Psych    GI/Hepatic negative GI ROS, Neg liver ROS,,,  Endo/Other  negative endocrine ROS  prediabetes  Renal/GU negative Renal ROS     Musculoskeletal   Abdominal  (+) - obese  Peds  Hematology  (+) Blood dyscrasia (on Plavix), anemia   Anesthesia Other Findings 66 year old gentleman is status post transtibial amputation.  He has developed an acute draining ulcer over the distal tibia.  Reproductive/Obstetrics                             Anesthesia Physical Anesthesia Plan  ASA: 3  Anesthesia Plan: General and Regional   Post-op Pain Management: Regional block*   Induction:   PONV Risk Score and Plan: 2 and Ondansetron and Dexamethasone  Airway Management Planned: LMA  Additional Equipment:   Intra-op Plan:   Post-operative Plan: Extubation in OR  Informed Consent: I have reviewed the patients History and Physical, chart, labs and discussed the procedure including the risks, benefits and alternatives for the proposed anesthesia with the patient or authorized representative who has indicated his/her understanding and  acceptance.     Dental advisory given  Plan Discussed with: CRNA and Anesthesiologist  Anesthesia Plan Comments: (Risks of general anesthesia discussed including, but not limited to, sore throat, hoarse voice, chipped/damaged teeth, injury to vocal cords, nausea and vomiting, allergic reactions, lung infection, heart attack, stroke, and death. All questions answered.  Discussed potential risks of nerve blocks including, but not limited to, infection, bleeding, nerve damage, seizures, pneumothorax, respiratory depression, and potential failure of the block. Alternatives to nerve blocks discussed. All questions answered. )        Anesthesia Quick Evaluation

## 2022-06-20 NOTE — Anesthesia Postprocedure Evaluation (Signed)
Anesthesia Post Note  Patient: Clinton [name]  Procedure(s) Performed: REVISION RIGHT BELOW KNEE AMPUTATION (Right) APPLICATION OF WOUND VAC (Right)     Patient location during evaluation: PACU Anesthesia Type: Regional Level of consciousness: awake and alert, patient cooperative and oriented Pain management: pain level controlled (pt states no pain) Vital Signs Assessment: post-procedure vital signs reviewed and stable Respiratory status: spontaneous breathing, nonlabored ventilation and respiratory function stable Cardiovascular status: blood pressure returned to baseline and stable Postop Assessment: no apparent nausea or vomiting Anesthetic complications: no   No notable events documented.  Last Vitals:  Vitals:   06/20/22 1250 06/20/22 1526  BP: 119/60 125/68  Pulse: 66 62  Resp: 17 15  Temp:  37.1 C  SpO2: 100% 100%    Last Pain:  Vitals:   06/20/22 1526  TempSrc:   PainSc: 0-No pain                 Nhia Heaphy,E. Ryder Chesmore

## 2022-06-20 NOTE — H&P (Signed)
Clinton [name] is an 66 y.o. male.   Chief Complaint: Acute draining ulcer right transtibial amputation. HPI: Patient is a 66 year old gentleman is status post transtibial amputation.  He has developed an acute draining ulcer over the distal tibia.  Past Medical History:  Diagnosis Date   Carotid artery occlusion    Cellulitis    Erectile dysfunction    Hyperlipidemia    Hypertension    Peripheral vascular disease (HCC)     Past Surgical History:  Procedure Laterality Date   ABDOMINAL AORTOGRAM W/LOWER EXTREMITY N/A 02/04/2022   Procedure: ABDOMINAL AORTOGRAM W/LOWER EXTREMITY;  Surgeon: Nada Libman, MD;  [facility]: [facility];  Service: Cardiovascular;  Laterality: N/A;   ABDOMINAL AORTOGRAM W/LOWER EXTREMITY N/A 02/18/2022   Procedure: ABDOMINAL AORTOGRAM W/LOWER EXTREMITY;  Surgeon: Nada Libman, MD;  [facility]: [facility];  Service: Cardiovascular;  Laterality: N/A;   ABDOMINAL AORTOGRAM W/LOWER EXTREMITY N/A 04/15/2022   Procedure: ABDOMINAL AORTOGRAM W/LOWER EXTREMITY;  Surgeon: Nada Libman, MD;  [facility]: [facility];  Service: Cardiovascular;  Laterality: N/A;   AMPUTATION Right 04/16/2022   Procedure: RIGHT 5TH RAY AMPUTATION;  Surgeon: Nadara Mustard, MD;  Location: [facility] OR;  Service: Orthopedics;  Laterality: Right;   AMPUTATION Right 05/09/2022   Procedure: RIGHT BELOW KNEE AMPUTATION;  Surgeon: Nadara Mustard, MD;  Location: [facility] OR;  Service: Orthopedics;  Laterality: Right;   COLONOSCOPY     ENDARTERECTOMY Right 09/05/2015   Procedure: ENDARTERECTOMY CAROTID RIGHT;  Surgeon: Sherren Kerns, MD;  Location: [facility] OR;  Service: Vascular;  Laterality: Right;   PATCH ANGIOPLASTY  09/05/2015   Procedure: PATCH ANGIOPLASTY RIGHT CAROTID ARTERY USING HEMASHIELD PLATINUM FINESSE PATCH;  Surgeon: Sherren Kerns, MD;  Location: [facility] OR;  Service: Vascular;;   PERIPHERAL VASCULAR ATHERECTOMY Left 02/18/2022   Procedure: PERIPHERAL VASCULAR ATHERECTOMY;   Surgeon: Nada Libman, MD;  [facility]: [facility];  Service: Cardiovascular;  Laterality: Left;  Popliteal and Peroneal   PERIPHERAL VASCULAR BALLOON ANGIOPLASTY  02/04/2022   Procedure: PERIPHERAL VASCULAR BALLOON ANGIOPLASTY;  Surgeon: Nada Libman, MD;  [facility]: [facility];  Service: Cardiovascular;;   PERIPHERAL VASCULAR BALLOON ANGIOPLASTY Right 04/15/2022   Procedure: PERIPHERAL VASCULAR BALLOON ANGIOPLASTY;  Surgeon: Nada Libman, MD;  [facility];  Service: Cardiovascular;  Laterality: Right;  SFA/POPLITEAL   TONSILLECTOMY     VASECTOMY      Family History  Problem Relation Age of Onset   Other Mother        circulatory problems   Heart attack Mother    Deep vein thrombosis Mother    Heart disease Mother    Hyperlipidemia Father    Hypertension Father    Diabetes Father    Deep vein thrombosis Father    Heart disease Father        before age 46   Heart attack Father    Peripheral vascular disease Father    Hypertension Brother    Hyperlipidemia Brother    Social History:  reports that he has never smoked. He quit smokeless tobacco use about 7 years ago.  His smokeless tobacco use included chew. He reports that he does not currently use alcohol. He reports that he does not use drugs.  Allergies:  Allergies  Allergen Reactions   Penicillins Rash    Has patient had a PCN reaction causing immediate rash, facial/tongue/throat swelling, SOB or lightheadedness with hypotension: Yes Has patient had a  PCN reaction causing severe rash involving mucus membranes or skin necrosis: No Has patient had a PCN reaction that required hospitalization No Has patient had a PCN reaction occurring within the last 10 years: No If all of the above answers are "NO", then may proceed with Cephalosporin use.    Sulfa Antibiotics Swelling and Other (See Comments)    Facial/lip swelling    No medications prior to admission.    No results found for this  or any previous visit (from the past 48 hour(s)). No results found.  Review of Systems  All other systems reviewed and are negative.   There were no vitals taken for this visit. Physical Exam  Examination patient is alert oriented no adenopathy well-dressed normal affect normal respiratory effort.  He has dehiscence of the wound over the distal tibia this is 5 mm in diameter there is exposed bone there is clear serosanguineous drainage there is no cellulitis. 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  Plan: We will plan for distal resection of the tibia local tissue rearrangement wound closure and placement of Kerecis tissue graft.  Risk and benefits were discussed including recurrent infection.  Patient states he understands wished to proceed at this time.  Nadara Mustard, MD 06/20/2022, 7:06 AM

## 2022-06-20 NOTE — Anesthesia Procedure Notes (Signed)
Procedure Name: LMA Insertion Date/Time: 06/20/2022 2:45 PM  Performed by: Cy Blamer, CRNAPre-anesthesia Checklist: Patient identified, Emergency Drugs available, Suction available, Patient being monitored and Timeout performed Patient Re-evaluated:Patient Re-evaluated prior to induction Oxygen Delivery Method: Circle system utilized Preoxygenation: Pre-oxygenation with 100% oxygen Induction Type: IV induction LMA: LMA inserted LMA Size: 4.0 Number of attempts: 1 Tube secured with: Tape Dental Injury: Teeth and Oropharynx as per pre-operative assessment

## 2022-06-20 NOTE — Anesthesia Procedure Notes (Addendum)
Anesthesia Regional Block: Adductor canal block   Pre-Anesthetic Checklist: , timeout performed,  Correct Patient, Correct Site, Correct Laterality,  Correct Procedure, Correct Position, site marked,  Risks and benefits discussed,  Surgical consent,  Pre-op evaluation,  At surgeon's request and post-op pain management  Laterality: Right  Prep: chloraprep       Needles:  Injection technique: Single-shot  Needle Type: Echogenic Stimulator Needle     Needle Length: 9cm  Needle Gauge: 21     Additional Needles:   Procedures:,,,, ultrasound used (permanent image in chart),,    Narrative:  Start time: 06/20/2022 12:40 PM End time: 06/20/2022 12:44 PM Injection made incrementally with aspirations every 5 mL.  Performed by: Personally  Anesthesiologist: Linton Rump, MD  Additional Notes: Discussed risks and benefits of nerve block including, but not limited to, prolonged and/or permanent nerve injury involving sensory and/or motor function. Monitors were applied and a time-out was performed. The nerve and associated structures were visualized under ultrasound guidance. After negative aspiration, local anesthetic was slowly injected around the nerve. There was no evidence of high pressure during the procedure. There were no paresthesias. VSS remained stable and the patient tolerated the procedure well.

## 2022-06-23 ENCOUNTER — Encounter: Payer: 59 | Admitting: Orthopedic Surgery

## 2022-06-23 ENCOUNTER — Encounter (HOSPITAL_COMMUNITY): Payer: Self-pay | Admitting: Orthopedic Surgery

## 2022-06-23 ENCOUNTER — Ambulatory Visit: Payer: 59 | Admitting: Surgery

## 2022-06-23 ENCOUNTER — Encounter (HOSPITAL_COMMUNITY): Payer: 59

## 2022-06-24 NOTE — Addendum Note (Signed)
Addendum  created 06/24/22 0815 by Linton Rump, MD   Clinical Note Signed, Intraprocedure Blocks edited

## 2022-06-25 LAB — AEROBIC/ANAEROBIC CULTURE W GRAM STAIN (SURGICAL/DEEP WOUND)

## 2022-06-27 ENCOUNTER — Ambulatory Visit (INDEPENDENT_AMBULATORY_CARE_PROVIDER_SITE_OTHER): Payer: 59

## 2022-06-27 ENCOUNTER — Encounter: Payer: 59 | Admitting: Family

## 2022-06-27 DIAGNOSIS — S88111A Complete traumatic amputation at level between knee and ankle, right lower leg, initial encounter: Secondary | ICD-10-CM

## 2022-06-27 DIAGNOSIS — Z89511 Acquired absence of right leg below knee: Secondary | ICD-10-CM

## 2022-06-27 NOTE — Progress Notes (Unsigned)
Pt is 7 days post revision right below the knee amputation on 11/10/203. He is here today for wound vac removal. There is no drainage in the canister. The incision is well approximated. There is a small area anterior incision that is puckering and had bloody drainage after vac removal. Pt is on Plavix. He has been wearing his shrinker and limb protector. Advised to wash the area with Dial soap and water daily and to apply the shrinker with direct skin contact and gauze with an ace bandage on the out side to absorb drainage. The pt does report that he has been sitting at his computer working from home 8 hours a day. I advised that he needs to elevate the limb  about chest level throughout the day. He states that he keeps the limb waist level but will try to do this periodically. All questions have been answered and he will call should they have any other questions.  Pt will follow up with Dr. [name] next week  Suann Larry, RMA, [facility]

## 2022-06-30 ENCOUNTER — Other Ambulatory Visit: Payer: Self-pay

## 2022-06-30 ENCOUNTER — Encounter (HOSPITAL_COMMUNITY): Payer: Self-pay | Admitting: Orthopedic Surgery

## 2022-07-01 ENCOUNTER — Ambulatory Visit (INDEPENDENT_AMBULATORY_CARE_PROVIDER_SITE_OTHER): Payer: 59 | Admitting: Orthopedic Surgery

## 2022-07-01 DIAGNOSIS — T8781 Dehiscence of amputation stump: Secondary | ICD-10-CM

## 2022-07-01 DIAGNOSIS — Z89511 Acquired absence of right leg below knee: Secondary | ICD-10-CM

## 2022-07-01 DIAGNOSIS — S88111A Complete traumatic amputation at level between knee and ankle, right lower leg, initial encounter: Secondary | ICD-10-CM

## 2022-07-06 ENCOUNTER — Encounter: Payer: Self-pay | Admitting: Orthopedic Surgery

## 2022-07-06 NOTE — Progress Notes (Signed)
Office Visit Note   Patient: Clinton [name]           Date of Birth: 01/12/1956           MRN: [id number] Visit Date: 07/01/2022              Requested by: Lujean Amel, MD [address] [place],  [place] [address] PCP: Lujean Amel, MD  Chief Complaint  Patient presents with   Right Leg - Routine Post Op    06/20/2022 revision right BKA 06/20/2022 kerecis graft       HPI: Patient is a 66 year old gentleman who presents 2 weeks status post revision right transtibial amputation with application of Kerecis tissue graft.  He has a limb protector and shrinker in place.  He is on Plavix.  Patient is on doxycycline.  He states that he rarely takes Tylenol for pain.  Assessment & Plan: Visit Diagnoses:  1. Dehiscence of amputation stump ([place])   2. Below-knee amputation of right lower extremity ([place])     Plan: Continue with compression reevaluate in 1 week for suture removal.  Follow-Up Instructions: Return in about 1 week (around 07/08/2022).   Ortho Exam  Patient is alert, oriented, no adenopathy, well-dressed, normal affect, normal respiratory effort. Examination the incision is healing nicely there is no cellulitis there is a small amount of clear drainage.  Imaging: No results found.   Labs: Lab Results  Component Value Date   HGBA1C 5.4 04/15/2022   ESRSEDRATE 14 04/08/2022   CRP 2.1 04/08/2022   REPTSTATUS 06/25/2022 FINAL 06/20/2022   GRAMSTAIN  06/20/2022    ABUNDANT WBC PRESENT,BOTH PMN AND MONONUCLEAR NO ORGANISMS SEEN    CULT  06/20/2022    RARE HAEMOPHILUS PARAINFLUENZAE BETA LACTAMASE NEGATIVE FEW GEMELLA MORBILLORUM Standardized susceptibility testing for this organism is not available. Performed at [facility], [place] [address], [place], [place] [address]      Lab Results  Component Value Date   ALBUMIN 3.6 06/20/2022   ALBUMIN 2.3 (L) 05/15/2022   ALBUMIN 3.9 08/27/2015    No results found for: "MG" No  results found for: "VD25OH"  No results found for: "PREALBUMIN"    Latest Ref Rng & Units 06/20/2022   11:45 AM 05/15/2022    5:14 AM 05/11/2022    4:07 AM  CBC EXTENDED  WBC 4.0 - 10.5 K/uL 6.3  5.6  9.4   RBC 4.22 - 5.81 MIL/uL 3.85  2.55  2.88   Hemoglobin 13.0 - 17.0 g/dL 12.2  8.1  9.3   HCT 39.0 - 52.0 % 36.3  24.2  28.0   Platelets 150 - 400 K/uL 231  218  303   NEUT# 1.7 - 7.7 K/uL 4.2  3.6    Lymph# 0.7 - 4.0 K/uL 1.5  1.3       There is no height or weight on file to calculate BMI.  Orders:  No orders of the defined types were placed in this encounter.  No orders of the defined types were placed in this encounter.    Procedures: No procedures performed  Clinical Data: No additional findings.  ROS:  All other systems negative, except as noted in the HPI. Review of Systems  Objective: Vital Signs: There were no vitals taken for this visit.  Specialty Comments:  No specialty comments available.  PMFS History: Patient Active Problem List   Diagnosis Date Noted   Dehiscence of amputation stump of right lower extremity ([place]) 06/20/2022  Acute blood loss anemia 05/19/2022   Post-op pain 05/19/2022   Acute renal failure ([place]) 05/19/2022   S/P BKA (below knee amputation), right ([place]) 05/14/2022   Below-knee amputation of right lower extremity ([place]) 05/09/2022   Gangrene of right foot (HCC)    PAD (peripheral artery disease) ([place]) 04/17/2022   Acute osteomyelitis of metatarsal bone of right foot (HCC)    Atherosclerosis of native arteries of the extremities with ulceration ([place]) 04/15/2022   Peripheral arterial disease ([place]) 03/25/2022   Obesity 03/21/2022   Prediabetes 03/21/2022   Erectile dysfunction 07/26/2021   Family history of ischemic heart disease (IHD) 07/26/2021   Pure hypercholesterolemia 07/26/2021   History of adenomatous polyp of colon 02/03/2019   Internal hemorrhoids 02/03/2019   Carotid artery stenosis, asymptomatic 09/05/2015    Cellulitis    Hypertension    Hyperlipidemia    Occlusion and stenosis of carotid artery without mention of cerebral infarction 01/01/2012   Past Medical History:  Diagnosis Date   Carotid artery occlusion    Cellulitis    Erectile dysfunction    Hyperlipidemia    Hypertension    Peripheral vascular disease ([place])     Family History  Problem Relation Age of Onset   Other Mother        circulatory problems   Heart attack Mother    Deep vein thrombosis Mother    Heart disease Mother    Hyperlipidemia Father    Hypertension Father    Diabetes Father    Deep vein thrombosis Father    Heart disease Father        before age 65   Heart attack Father    Peripheral vascular disease Father    Hypertension Brother    Hyperlipidemia Brother     Past Surgical History:  Procedure Laterality Date   ABDOMINAL AORTOGRAM W/LOWER EXTREMITY N/A 02/04/2022   Procedure: ABDOMINAL AORTOGRAM W/LOWER EXTREMITY;  Surgeon: Serafina Mitchell, MD;  Location: [place] [facility];  Service: Cardiovascular;  Laterality: N/A;   ABDOMINAL AORTOGRAM W/LOWER EXTREMITY N/A 02/18/2022   Procedure: ABDOMINAL AORTOGRAM W/LOWER EXTREMITY;  Surgeon: Serafina Mitchell, MD;  Location: [place] [facility];  Service: Cardiovascular;  Laterality: N/A;   ABDOMINAL AORTOGRAM W/LOWER EXTREMITY N/A 04/15/2022   Procedure: ABDOMINAL AORTOGRAM W/LOWER EXTREMITY;  Surgeon: Serafina Mitchell, MD;  Location: [place] [facility];  Service: Cardiovascular;  Laterality: N/A;   AMPUTATION Right 04/16/2022   Procedure: RIGHT 5TH RAY AMPUTATION;  Surgeon: Newt Minion, MD;  Location: [place];  Service: Orthopedics;  Laterality: Right;   AMPUTATION Right 05/09/2022   Procedure: RIGHT BELOW KNEE AMPUTATION;  Surgeon: Newt Minion, MD;  Location: [place];  Service: Orthopedics;  Laterality: Right;   APPLICATION OF WOUND VAC Right 06/20/2022   Procedure: APPLICATION OF WOUND VAC;  Surgeon: Newt Minion, MD;  Location: [place];  Service: Orthopedics;   Laterality: Right;   COLONOSCOPY     ENDARTERECTOMY Right 09/05/2015   Procedure: ENDARTERECTOMY CAROTID RIGHT;  Surgeon: Elam Dutch, MD;  Location: [facility] OR;  Service: Vascular;  Laterality: Right;   PATCH ANGIOPLASTY  09/05/2015   Procedure: PATCH ANGIOPLASTY RIGHT CAROTID ARTERY USING HEMASHIELD PLATINUM FINESSE PATCH;  Surgeon: Elam Dutch, MD;  Location: [place];  Service: Vascular;;   PERIPHERAL VASCULAR ATHERECTOMY Left 02/18/2022   Procedure: PERIPHERAL VASCULAR ATHERECTOMY;  Surgeon: Serafina Mitchell, MD;  Location: [place] [facility];  Service: Cardiovascular;  Laterality: Left;  Popliteal and Peroneal  PERIPHERAL VASCULAR BALLOON ANGIOPLASTY  02/04/2022   Procedure: PERIPHERAL VASCULAR BALLOON ANGIOPLASTY;  Surgeon: Nada Libman, MD;  [facility];  Service: Cardiovascular;;   PERIPHERAL VASCULAR BALLOON ANGIOPLASTY Right 04/15/2022   Procedure: PERIPHERAL VASCULAR BALLOON ANGIOPLASTY;  Surgeon: Nada Libman, MD;  [facility];  Service: Cardiovascular;  Laterality: Right;  SFA/POPLITEAL   STUMP REVISION Right 06/20/2022   Procedure: REVISION RIGHT BELOW KNEE AMPUTATION;  Surgeon: Nadara Mustard, MD;  Location: [facility] OR;  Service: Orthopedics;  Laterality: Right;   TONSILLECTOMY     VASECTOMY     Social History   Occupational History   Not on file  Tobacco Use   Smoking status: Never   Smokeless tobacco: Former    Types: Chew    Quit date: 01/01/2015  Vaping Use   Vaping Use: Never used  Substance and Sexual Activity   Alcohol use: Not Currently   Drug use: Not on file   Sexual activity: Not on file

## 2022-07-08 ENCOUNTER — Encounter: Payer: 59 | Admitting: Orthopedic Surgery

## 2022-07-08 ENCOUNTER — Ambulatory Visit (INDEPENDENT_AMBULATORY_CARE_PROVIDER_SITE_OTHER): Payer: 59 | Admitting: Orthopedic Surgery

## 2022-07-08 DIAGNOSIS — T8781 Dehiscence of amputation stump: Secondary | ICD-10-CM

## 2022-07-08 DIAGNOSIS — S88111A Complete traumatic amputation at level between knee and ankle, right lower leg, initial encounter: Secondary | ICD-10-CM

## 2022-07-09 ENCOUNTER — Telehealth: Payer: Self-pay | Admitting: Orthopedic Surgery

## 2022-07-09 ENCOUNTER — Ambulatory Visit (INDEPENDENT_AMBULATORY_CARE_PROVIDER_SITE_OTHER): Payer: 59

## 2022-07-09 ENCOUNTER — Ambulatory Visit (HOSPITAL_BASED_OUTPATIENT_CLINIC_OR_DEPARTMENT_OTHER)
Admission: RE | Admit: 2022-07-09 | Discharge: 2022-07-09 | Disposition: A | Discharge status: Home / Self Care | Payer: 59 | Source: Ambulatory Visit | Attending: Cardiovascular Disease | Admitting: Cardiovascular Disease

## 2022-07-09 ENCOUNTER — Encounter (HOSPITAL_BASED_OUTPATIENT_CLINIC_OR_DEPARTMENT_OTHER): Payer: Self-pay

## 2022-07-09 DIAGNOSIS — I10 Essential (primary) hypertension: Secondary | ICD-10-CM

## 2022-07-09 DIAGNOSIS — Z8249 Family history of ischemic heart disease and other diseases of the circulatory system: Secondary | ICD-10-CM | POA: Diagnosis not present

## 2022-07-09 DIAGNOSIS — E782 Mixed hyperlipidemia: Secondary | ICD-10-CM | POA: Insufficient documentation

## 2022-07-09 DIAGNOSIS — I739 Peripheral vascular disease, unspecified: Secondary | ICD-10-CM | POA: Diagnosis not present

## 2022-07-09 DIAGNOSIS — R931 Abnormal findings on diagnostic imaging of heart and coronary circulation: Secondary | ICD-10-CM

## 2022-07-09 LAB — ECHOCARDIOGRAM COMPLETE
Area-P 1/2: 3.12 cm2
S' Lateral: 3.04 cm

## 2022-07-09 NOTE — Telephone Encounter (Signed)
Called pt and sw wife. She wanted to know if it was ok to shower. Advised ok to wash the area with dial soap and water daily. Will call with any other questions.

## 2022-07-09 NOTE — Telephone Encounter (Signed)
Pt's wife she has a questions about pt's stiches and him bathing. Please call pt's wife [name] back at [phone].

## 2022-07-11 ENCOUNTER — Encounter (HOSPITAL_COMMUNITY): Payer: Self-pay | Admitting: Cardiovascular Disease

## 2022-07-11 ENCOUNTER — Other Ambulatory Visit: Payer: Self-pay

## 2022-07-11 DIAGNOSIS — R931 Abnormal findings on diagnostic imaging of heart and coronary circulation: Secondary | ICD-10-CM

## 2022-07-11 NOTE — Telephone Encounter (Signed)
Spoke with patient who added wife to call. Gave them results and explanation of echo and calcium scoring per Dr. Allyson Sabal. Lexiscan explained and order placed. Wife asked if patient could have a specific gene blood test done because a grandson died and the other grandson has heart tissues too. She will put the name of the test in MyChart.

## 2022-07-11 NOTE — Telephone Encounter (Signed)
Patient would like results to his echo and CT scoring.

## 2022-07-13 ENCOUNTER — Encounter: Payer: Self-pay | Admitting: Cardiovascular Disease

## 2022-07-15 ENCOUNTER — Encounter: Payer: 59 | Admitting: Orthopedic Surgery

## 2022-07-16 ENCOUNTER — Telehealth: Payer: Self-pay

## 2022-07-16 ENCOUNTER — Other Ambulatory Visit: Payer: Self-pay | Admitting: Orthopedic Surgery

## 2022-07-16 DIAGNOSIS — I7025 Atherosclerosis of native arteries of other extremities with ulceration: Secondary | ICD-10-CM

## 2022-07-16 NOTE — Telephone Encounter (Signed)
Sure, Christiane Ha. I will check on the test report and reach back to you.

## 2022-07-17 ENCOUNTER — Ambulatory Visit (INDEPENDENT_AMBULATORY_CARE_PROVIDER_SITE_OTHER): Payer: 59 | Admitting: Orthopedic Surgery

## 2022-07-17 ENCOUNTER — Encounter: Payer: Self-pay | Admitting: Orthopedic Surgery

## 2022-07-17 ENCOUNTER — Telehealth (HOSPITAL_COMMUNITY): Payer: Self-pay | Admitting: *Deleted

## 2022-07-17 DIAGNOSIS — Z89512 Acquired absence of left leg below knee: Secondary | ICD-10-CM

## 2022-07-17 DIAGNOSIS — S88111A Complete traumatic amputation at level between knee and ankle, right lower leg, initial encounter: Secondary | ICD-10-CM

## 2022-07-17 NOTE — Progress Notes (Signed)
Office Visit Note   Patient: Clinton [name]           Date of Birth: 1955-11-10           MRN: [id number] Visit Date: 07/17/2022              Requested by: Darrow Bussing, MD [address] [place],  [place] [address] PCP: Darrow Bussing, MD  Chief Complaint  Patient presents with   Right Leg - Routine Post Op    06/20/2022 revision right BKA 06/20/2022 kerecis graft       HPI: Patient is a 66 year old gentleman who presents 4 weeks status post revision right transtibial amputation with application of Kerecis micro graft.  Patient states he had 1 small area of drainage and the incision continues to heal well.  Assessment & Plan: Visit Diagnoses:  1. Below-knee amputation of right lower extremity (HCC)     Plan: Will remove sutures today continue compression, Dial soap cleansing, follow-up in the office in 1 week.  Patient will complete his doxycycline.  Follow-Up Instructions: Return in about 1 week (around 07/24/2022).   Ortho Exam  Patient is alert, oriented, no adenopathy, well-dressed, normal affect, normal respiratory effort. Examination the incision to continues to heal well.  There is 1 area midline that is 1 cm in length 3 mm in width with clear serous drainage.  There is no depth to the wound.  There is a mild area of redness about 1 cm in diameter.  Imaging: No results found.    Labs: Lab Results  Component Value Date   HGBA1C 5.4 04/15/2022   ESRSEDRATE 14 04/08/2022   CRP 2.1 04/08/2022   REPTSTATUS 06/25/2022 FINAL 06/20/2022   GRAMSTAIN  06/20/2022    ABUNDANT WBC PRESENT,BOTH PMN AND MONONUCLEAR NO ORGANISMS SEEN    CULT  06/20/2022    RARE HAEMOPHILUS PARAINFLUENZAE BETA LACTAMASE NEGATIVE FEW GEMELLA MORBILLORUM Standardized susceptibility testing for this organism is not available. Performed at [facility], [address], [place], [place] [address]      Lab Results  Component Value Date   ALBUMIN 3.6  06/20/2022   ALBUMIN 2.3 (L) 05/15/2022   ALBUMIN 3.9 08/27/2015    No results found for: "MG" No results found for: "VD25OH"  No results found for: "PREALBUMIN"    Latest Ref Rng & Units 06/20/2022   11:45 AM 05/15/2022    5:14 AM 05/11/2022    4:07 AM  CBC EXTENDED  WBC 4.0 - 10.5 K/uL 6.3  5.6  9.4   RBC 4.22 - 5.81 MIL/uL 3.85  2.55  2.88   Hemoglobin 13.0 - 17.0 g/dL 17.6  8.1  9.3   HCT 16.0 - 52.0 % 36.3  24.2  28.0   Platelets 150 - 400 K/uL 231  218  303   NEUT# 1.7 - 7.7 K/uL 4.2  3.6    Lymph# 0.7 - 4.0 K/uL 1.5  1.3       There is no height or weight on file to calculate BMI.  Orders:  No orders of the defined types were placed in this encounter.  No orders of the defined types were placed in this encounter.    Procedures: No procedures performed  Clinical Data: No additional findings.  ROS:  All other systems negative, except as noted in the HPI. Review of Systems  Objective: Vital Signs: There were no vitals taken for this visit.  Specialty Comments:  No specialty comments available.  PMFS History: Patient Active Problem List   Diagnosis Date Noted   Dehiscence of amputation stump of right lower extremity (HCC) 06/20/2022   Acute blood loss anemia 05/19/2022   Post-op pain 05/19/2022   Acute renal failure (HCC) 05/19/2022   S/P BKA (below knee amputation), right (HCC) 05/14/2022   Below-knee amputation of right lower extremity (HCC) 05/09/2022   Gangrene of right foot (HCC)    PAD (peripheral artery disease) (HCC) 04/17/2022   Acute osteomyelitis of metatarsal bone of right foot (HCC)    Atherosclerosis of native arteries of the extremities with ulceration (HCC) 04/15/2022   Peripheral arterial disease (HCC) 03/25/2022   Obesity 03/21/2022   Prediabetes 03/21/2022   Erectile dysfunction 07/26/2021   Family history of ischemic heart disease (IHD) 07/26/2021   Pure hypercholesterolemia 07/26/2021   History of adenomatous polyp of colon  02/03/2019   Internal hemorrhoids 02/03/2019   Carotid artery stenosis, asymptomatic 09/05/2015   Cellulitis    Hypertension    Hyperlipidemia    Occlusion and stenosis of carotid artery without mention of cerebral infarction 01/01/2012   Past Medical History:  Diagnosis Date   Carotid artery occlusion    Cellulitis    Erectile dysfunction    Hyperlipidemia    Hypertension    Peripheral vascular disease (HCC)     Family History  Problem Relation Age of Onset   Other Mother        circulatory problems   Heart attack Mother    Deep vein thrombosis Mother    Heart disease Mother    Hyperlipidemia Father    Hypertension Father    Diabetes Father    Deep vein thrombosis Father    Heart disease Father        before age 14   Heart attack Father    Peripheral vascular disease Father    Hypertension Brother    Hyperlipidemia Brother     Past Surgical History:  Procedure Laterality Date   ABDOMINAL AORTOGRAM W/LOWER EXTREMITY N/A 02/04/2022   Procedure: ABDOMINAL AORTOGRAM W/LOWER EXTREMITY;  Surgeon: Nada Libman, MD;  [facility];  Service: Cardiovascular;  Laterality: N/A;   ABDOMINAL AORTOGRAM W/LOWER EXTREMITY N/A 02/18/2022   Procedure: ABDOMINAL AORTOGRAM W/LOWER EXTREMITY;  Surgeon: Nada Libman, MD;  [facility];  Service: Cardiovascular;  Laterality: N/A;   ABDOMINAL AORTOGRAM W/LOWER EXTREMITY N/A 04/15/2022   Procedure: ABDOMINAL AORTOGRAM W/LOWER EXTREMITY;  Surgeon: Nada Libman, MD;  [facility];  Service: Cardiovascular;  Laterality: N/A;   AMPUTATION Right 04/16/2022   Procedure: RIGHT 5TH RAY AMPUTATION;  Surgeon: Nadara Mustard, MD;  Location: [facility] OR;  Service: Orthopedics;  Laterality: Right;   AMPUTATION Right 05/09/2022   Procedure: RIGHT BELOW KNEE AMPUTATION;  Surgeon: Nadara Mustard, MD;  Location: [facility] OR;  Service: Orthopedics;  Laterality: Right;   APPLICATION OF WOUND VAC Right 06/20/2022    Procedure: APPLICATION OF WOUND VAC;  Surgeon: Nadara Mustard, MD;  [facility];  Service: Orthopedics;  Laterality: Right;   COLONOSCOPY     ENDARTERECTOMY Right 09/05/2015   Procedure: ENDARTERECTOMY CAROTID RIGHT;  Surgeon: Sherren Kerns, MD;  Location: [facility];  Service: Vascular;  Laterality: Right;   PATCH ANGIOPLASTY  09/05/2015   Procedure: PATCH ANGIOPLASTY RIGHT CAROTID ARTERY USING HEMASHIELD PLATINUM FINESSE PATCH;  Surgeon: Sherren Kerns, MD;  Location: [facility] OR;  Service: Vascular;;   PERIPHERAL VASCULAR ATHERECTOMY Left 02/18/2022   Procedure: PERIPHERAL VASCULAR  ATHERECTOMY;  Surgeon: Nada Libman, MD;  Location: [facility];  Service: Cardiovascular;  Laterality: Left;  Popliteal and Peroneal   PERIPHERAL VASCULAR BALLOON ANGIOPLASTY  02/04/2022   Procedure: PERIPHERAL VASCULAR BALLOON ANGIOPLASTY;  Surgeon: Nada Libman, MD;  [facility];  Service: Cardiovascular;;   PERIPHERAL VASCULAR BALLOON ANGIOPLASTY Right 04/15/2022   Procedure: PERIPHERAL VASCULAR BALLOON ANGIOPLASTY;  Surgeon: Nada Libman, MD;  [facility];  Service: Cardiovascular;  Laterality: Right;  SFA/POPLITEAL   STUMP REVISION Right 06/20/2022   Procedure: REVISION RIGHT BELOW KNEE AMPUTATION;  Surgeon: Nadara Mustard, MD;  Location: [facility] OR;  Service: Orthopedics;  Laterality: Right;   TONSILLECTOMY     VASECTOMY     Social History   Occupational History   Not on file  Tobacco Use   Smoking status: Never   Smokeless tobacco: Former    Types: Chew    Quit date: 01/01/2015  Vaping Use   Vaping Use: Never used  Substance and Sexual Activity   Alcohol use: Not Currently   Drug use: Not on file   Sexual activity: Not on file

## 2022-07-17 NOTE — Telephone Encounter (Signed)
LDVM per DPR.  ?

## 2022-07-18 ENCOUNTER — Ambulatory Visit (HOSPITAL_COMMUNITY): Payer: 59 | Attending: Cardiovascular Disease

## 2022-07-18 DIAGNOSIS — R931 Abnormal findings on diagnostic imaging of heart and coronary circulation: Secondary | ICD-10-CM | POA: Insufficient documentation

## 2022-07-18 MED ORDER — REGADENOSON 0.4 MG/5ML IV SOLN
0.4000 mg | Freq: Once | INTRAVENOUS | Status: AC
  Administered 2022-07-18: 0.4 mg via INTRAVENOUS

## 2022-07-18 MED ORDER — TECHNETIUM TC 99M TETROFOSMIN IV KIT
31.8000 | PACK | Freq: Once | INTRAVENOUS | Status: AC | PRN
  Administered 2022-07-18: 31.8 via INTRAVENOUS

## 2022-07-18 MED ORDER — TECHNETIUM TC 99M TETROFOSMIN IV KIT
10.5000 | PACK | Freq: Once | INTRAVENOUS | Status: AC | PRN
  Administered 2022-07-18: 10.5 via INTRAVENOUS

## 2022-07-20 ENCOUNTER — Encounter: Payer: Self-pay | Admitting: Orthopedic Surgery

## 2022-07-20 NOTE — Progress Notes (Signed)
Office Visit Note   Patient: Clinton [name]           Date of Birth: 1955/12/13           MRN: [id number] Visit Date: 07/08/2022              Requested by: Darrow Bussing, MD [address] [place],  [place] [address] PCP: Darrow Bussing, MD  Chief Complaint  Patient presents with   Right Leg - Routine Post Op    06/20/2022 revision right BKA kerecis graft      HPI: Patient is a 66 year old gentleman who is 2-1/2 weeks status post revision right below-knee amputation with Kerecis tissue graft.  Assessment & Plan: Visit Diagnoses:  1. Dehiscence of amputation stump (HCC)   2. Below-knee amputation of right lower extremity ([facility]CC)     Plan: Follow-up in 1 week to remove the sutures.  Follow-Up Instructions: Return in about 1 week (around 07/15/2022).   Ortho Exam  Patient is alert, oriented, no adenopathy, well-dressed, normal affect, normal respiratory effort. Examination the incision continues to heal well.  There is a small opening over the midline of the incision that is 3 cm in length and 1 mm in width.  There is some mild redness.  No drainage.  Imaging: No results found.    Labs: Lab Results  Component Value Date   HGBA1C 5.4 04/15/2022   ESRSEDRATE 14 04/08/2022   CRP 2.1 04/08/2022   REPTSTATUS 06/25/2022 FINAL 06/20/2022   GRAMSTAIN  06/20/2022    ABUNDANT WBC PRESENT,BOTH PMN AND MONONUCLEAR NO ORGANISMS SEEN    CULT  06/20/2022    RARE HAEMOPHILUS PARAINFLUENZAE BETA LACTAMASE NEGATIVE FEW GEMELLA MORBILLORUM Standardized susceptibility testing for this organism is not available. Performed at [facility], [address], [place], [place] [address]      Lab Results  Component Value Date   ALBUMIN 3.6 06/20/2022   ALBUMIN 2.3 (L) 05/15/2022   ALBUMIN 3.9 08/27/2015    No results found for: "MG" No results found for: "VD25OH"  No results found for: "PREALBUMIN"    Latest Ref Rng & Units 06/20/2022   11:45 AM  05/15/2022    5:14 AM 05/11/2022    4:07 AM  CBC EXTENDED  WBC 4.0 - 10.5 K/uL 6.3  5.6  9.4   RBC 4.22 - 5.81 MIL/uL 3.85  2.55  2.88   Hemoglobin 13.0 - 17.0 g/dL 48.0  8.1  9.3   HCT 16.5 - 52.0 % 36.3  24.2  28.0   Platelets 150 - 400 K/uL 231  218  303   NEUT# 1.7 - 7.7 K/uL 4.2  3.6    Lymph# 0.7 - 4.0 K/uL 1.5  1.3       There is no height or weight on file to calculate BMI.  Orders:  No orders of the defined types were placed in this encounter.  No orders of the defined types were placed in this encounter.    Procedures: No procedures performed  Clinical Data: No additional findings.  ROS:  All other systems negative, except as noted in the HPI. Review of Systems  Objective: Vital Signs: There were no vitals taken for this visit.  Specialty Comments:  No specialty comments available.  PMFS History: Patient Active Problem List   Diagnosis Date Noted   Dehiscence of amputation stump of right lower extremity (HCC) 06/20/2022   Acute blood loss anemia 05/19/2022   Post-op pain 05/19/2022  Acute renal failure (HCC) 05/19/2022   S/P BKA (below knee amputation), right (HCC) 05/14/2022   Below-knee amputation of right lower extremity (HCC) 05/09/2022   Gangrene of right foot (HCC)    PAD (peripheral artery disease) (HCC) 04/17/2022   Acute osteomyelitis of metatarsal bone of right foot (HCC)    Atherosclerosis of native arteries of the extremities with ulceration (HCC) 04/15/2022   Peripheral arterial disease (HCC) 03/25/2022   Obesity 03/21/2022   Prediabetes 03/21/2022   Erectile dysfunction 07/26/2021   Family history of ischemic heart disease (IHD) 07/26/2021   Pure hypercholesterolemia 07/26/2021   History of adenomatous polyp of colon 02/03/2019   Internal hemorrhoids 02/03/2019   Carotid artery stenosis, asymptomatic 09/05/2015   Cellulitis    Hypertension    Hyperlipidemia    Occlusion and stenosis of carotid artery without mention of cerebral  infarction 01/01/2012   Past Medical History:  Diagnosis Date   Carotid artery occlusion    Cellulitis    Erectile dysfunction    Hyperlipidemia    Hypertension    Peripheral vascular disease (HCC)     Family History  Problem Relation Age of Onset   Other Mother        circulatory problems   Heart attack Mother    Deep vein thrombosis Mother    Heart disease Mother    Hyperlipidemia Father    Hypertension Father    Diabetes Father    Deep vein thrombosis Father    Heart disease Father        before age 19   Heart attack Father    Peripheral vascular disease Father    Hypertension Brother    Hyperlipidemia Brother     Past Surgical History:  Procedure Laterality Date   ABDOMINAL AORTOGRAM W/LOWER EXTREMITY N/A 02/04/2022   Procedure: ABDOMINAL AORTOGRAM W/LOWER EXTREMITY;  Surgeon: Nada Libman, MD;  [facility];  Service: Cardiovascular;  Laterality: N/A;   ABDOMINAL AORTOGRAM W/LOWER EXTREMITY N/A 02/18/2022   Procedure: ABDOMINAL AORTOGRAM W/LOWER EXTREMITY;  Surgeon: Nada Libman, MD;  [facility];  Service: Cardiovascular;  Laterality: N/A;   ABDOMINAL AORTOGRAM W/LOWER EXTREMITY N/A 04/15/2022   Procedure: ABDOMINAL AORTOGRAM W/LOWER EXTREMITY;  Surgeon: Nada Libman, MD;  [facility];  Service: Cardiovascular;  Laterality: N/A;   AMPUTATION Right 04/16/2022   Procedure: RIGHT 5TH RAY AMPUTATION;  Surgeon: Nadara Mustard, MD;  Location: [facility] OR;  Service: Orthopedics;  Laterality: Right;   AMPUTATION Right 05/09/2022   Procedure: RIGHT BELOW KNEE AMPUTATION;  Surgeon: Nadara Mustard, MD;  Location: [facility] OR;  Service: Orthopedics;  Laterality: Right;   APPLICATION OF WOUND VAC Right 06/20/2022   Procedure: APPLICATION OF WOUND VAC;  Surgeon: Nadara Mustard, MD;  [facility];  Service: Orthopedics;  Laterality: Right;   COLONOSCOPY     ENDARTERECTOMY Right 09/05/2015   Procedure: ENDARTERECTOMY CAROTID RIGHT;   Surgeon: Sherren Kerns, MD;  Location: [facility] OR;  Service: Vascular;  Laterality: Right;   PATCH ANGIOPLASTY  09/05/2015   Procedure: PATCH ANGIOPLASTY RIGHT CAROTID ARTERY USING HEMASHIELD PLATINUM FINESSE PATCH;  Surgeon: Sherren Kerns, MD;  Location: [facility] OR;  Service: Vascular;;   PERIPHERAL VASCULAR ATHERECTOMY Left 02/18/2022   Procedure: PERIPHERAL VASCULAR ATHERECTOMY;  Surgeon: Nada Libman, MD;  [facility];  Service: Cardiovascular;  Laterality: Left;  Popliteal and Peroneal   PERIPHERAL VASCULAR BALLOON ANGIOPLASTY  02/04/2022   Procedure: PERIPHERAL VASCULAR BALLOON  ANGIOPLASTY;  Surgeon: Nada Libman, MD;  [facility];  Service: Cardiovascular;;   PERIPHERAL VASCULAR BALLOON ANGIOPLASTY Right 04/15/2022   Procedure: PERIPHERAL VASCULAR BALLOON ANGIOPLASTY;  Surgeon: Nada Libman, MD;  [facility];  Service: Cardiovascular;  Laterality: Right;  SFA/POPLITEAL   STUMP REVISION Right 06/20/2022   Procedure: REVISION RIGHT BELOW KNEE AMPUTATION;  Surgeon: Nadara Mustard, MD;  Location: [facility] OR;  Service: Orthopedics;  Laterality: Right;   TONSILLECTOMY     VASECTOMY     Social History   Occupational History   Not on file  Tobacco Use   Smoking status: Never   Smokeless tobacco: Former    Types: Chew    Quit date: 01/01/2015  Vaping Use   Vaping Use: Never used  Substance and Sexual Activity   Alcohol use: Not Currently   Drug use: Not on file   Sexual activity: Not on file

## 2022-07-21 ENCOUNTER — Encounter: Payer: Self-pay | Admitting: Surgery

## 2022-07-21 ENCOUNTER — Encounter (HOSPITAL_COMMUNITY): Payer: Self-pay

## 2022-07-21 ENCOUNTER — Ambulatory Visit (INDEPENDENT_AMBULATORY_CARE_PROVIDER_SITE_OTHER): Payer: 59 | Admitting: Surgery

## 2022-07-21 ENCOUNTER — Ambulatory Visit (INDEPENDENT_AMBULATORY_CARE_PROVIDER_SITE_OTHER)
Admission: RE | Admit: 2022-07-21 | Discharge: 2022-07-21 | Disposition: A | Discharge status: Home / Self Care | Payer: 59 | Source: Ambulatory Visit | Attending: Vascular Surgery | Admitting: Vascular Surgery

## 2022-07-21 ENCOUNTER — Ambulatory Visit (HOSPITAL_COMMUNITY)
Admission: RE | Admit: 2022-07-21 | Discharge: 2022-07-21 | Disposition: A | Discharge status: Home / Self Care | Payer: 59 | Source: Ambulatory Visit | Attending: Surgery | Admitting: Surgery

## 2022-07-21 VITALS — BP 159/78 | HR 72 | Temp 98.6°F | Resp 20 | Ht 70.0 in | Wt 173.0 lb

## 2022-07-21 DIAGNOSIS — I7025 Atherosclerosis of native arteries of other extremities with ulceration: Secondary | ICD-10-CM | POA: Diagnosis not present

## 2022-07-21 LAB — MYOCARDIAL PERFUSION IMAGING
Peak HR: 90 {beats}/min
Rest HR: 68 {beats}/min
Rest Nuclear Isotope Dose: 10.5 mCi
SDS: 0
SRS: 0
SSS: 0
ST Depression (mm): 0 mm
Stress Nuclear Isotope Dose: 31.8 mCi
TID: 0.85

## 2022-07-21 NOTE — Progress Notes (Signed)
Vascular and Vein Specialist of [facility]  Patient name: Clinton [name] MRN: [id number] DOB: June 06, 1956 Sex: male   REASON FOR VISIT:    Follow up  HISOTRY OF PRESENT ILLNESS:    Clinton [name] is a 66 y.o. male with a history of bilateral lower extremity ulcers since the beginning of the summer.  On 02/04/2022 he underwent angiography.  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  He had diffuse disease out onto the foot.  On 02/18/2022 he had repeat angiography this time of the left leg.  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  These were treated with drug-coated balloon angioplasty.  There was a residual stenosis in the distal popliteal artery and origin of the tibioperoneal trunk which was treated with CSI atherectomy.  He again had diffuse disease out onto the foot.  The last time I saw him, he was scheduled to have a right fifth toe amputation secondary to osteomyelitis by Dr. [name].  Because of the diffuse disease he had in his leg, I felt that repeating his angiogram before toe amputation will be advisable to maximize his chance of healing.  Therefore on 04/15/2022 he underwent angiography.  He had recurrent stenosis in the right popliteal artery behind the joint space, as well as within the anterior tibial artery.  These were both treated using a 3 mm balloon.  From a vascular perspective, he has been optimized.  He went on to have a right fifth toe amputation on 04/16/2022 by Dr. [name].  He did not have great bleeding at the time of his amputation.  He ultimately required right below-knee amputation on 05/09/2022 by Dr. [name], who revised this on 06/20/2022.  He still has had issues with wound healing     The patient underwent right-sided carotid  endarterectomy on 09/05/2015 for asymptomatic stenosis by [name].  His last ultrasound showed no recurrence with no significant bilateral stenosis.  He is also undergone evaluation for a aneurysm.  His ultrasound was negative for aortic enlargement.  He takes a statin for hypercholesterolemia.  He is medically managed for hypertension.   PAST MEDICAL HISTORY:   Past Medical History:  Diagnosis Date   Carotid artery occlusion    Cellulitis    Erectile dysfunction    Hyperlipidemia    Hypertension    Peripheral vascular disease (HCC)      FAMILY HISTORY:   Family History  Problem Relation Age of Onset   Other Mother        circulatory problems   Heart attack Mother    Deep vein thrombosis Mother    Heart disease Mother    Hyperlipidemia Father    Hypertension Father    Diabetes Father    Deep vein thrombosis Father    Heart disease Father        before age 67   Heart attack Father    Peripheral vascular disease Father    Hypertension Brother    Hyperlipidemia Brother     SOCIAL HISTORY:   Social History   Tobacco Use   Smoking status: Never   Smokeless tobacco: Former    Types: Chew    Quit date: 01/01/2015  Substance Use Topics   Alcohol use: Not Currently     ALLERGIES:   Allergies  Allergen Reactions  Penicillins Rash    Has patient had a PCN reaction causing immediate rash, facial/tongue/throat swelling, SOB or lightheadedness with hypotension: Yes Has patient had a PCN reaction causing severe rash involving mucus membranes or skin necrosis: No Has patient had a PCN reaction that required hospitalization No Has patient had a PCN reaction occurring within the last 10 years: No If all of the above answers are "NO", then may proceed with Cephalosporin use.  Tolerated Cefazolin on 04/16/22    Sulfa Antibiotics Swelling and Other (See Comments)    Facial/lip swelling     CURRENT MEDICATIONS:   Current Outpatient Medications  Medication Sig  Dispense Refill   acetaminophen (TYLENOL) 325 MG tablet Take 2 tablets (650 mg total) by mouth 4 (four) times daily -  with meals and at bedtime. 300 tablet 0   amLODipine (NORVASC) 2.5 MG tablet Take 2.5 mg by mouth at bedtime.     ascorbic acid (VITAMIN C) 1000 MG tablet Take 1 tablet (1,000 mg total) by mouth daily.     aspirin EC 81 MG tablet Take 81 mg by mouth at bedtime.     atorvastatin (LIPITOR) 80 MG tablet Take 80 mg by mouth at bedtime.     clopidogrel (PLAVIX) 75 MG tablet Take 1 tablet (75 mg total) by mouth daily. (Patient taking differently: Take 75 mg by mouth at bedtime.) 30 tablet 11   docusate sodium (COLACE) 100 MG capsule Take 1 capsule (100 mg total) by mouth daily. 30 capsule 0   doxycycline (VIBRA-TABS) 100 MG tablet Take 1 tablet (100 mg total) by mouth 2 (two) times daily. 60 tablet 0   irbesartan-hydrochlorothiazide (AVALIDE) 300-12.5 MG tablet Take 1 tablet by mouth at bedtime.     methocarbamol (ROBAXIN) 500 MG tablet Take 1 tablet (500 mg total) by mouth every 6 (six) hours as needed for muscle spasms. 90 tablet 0   Multiple Vitamin (MULTIVITAMIN) capsule Take 2 capsules by mouth at bedtime. Gummy     Omega-3 Fatty Acids (OMEGA 3 PO) Take 1 capsule by mouth daily.     oxyCODONE (OXY IR/ROXICODONE) 5 MG immediate release tablet Take 2 tablets (10 mg total) by mouth every 6 (six) hours as needed for severe pain. 30 tablet 0   polyethylene glycol (MIRALAX / GLYCOLAX) 17 g packet Take 17 g by mouth daily as needed for mild constipation. 14 each 0   Probiotic Product (PROBIOTIC PO) Take 1 capsule by mouth daily.     sildenafil (VIAGRA) 100 MG tablet Take 100 mg by mouth as needed for erectile dysfunction.     zinc sulfate 220 (50 Zn) MG capsule Take 1 capsule (220 mg total) by mouth daily.     No current facility-administered medications for this visit.    REVIEW OF SYSTEMS:   [X]  denotes positive finding, [ ]  denotes negative finding Cardiac  Comments:  Chest pain  or chest pressure:    Shortness of breath upon exertion:    Short of breath when lying flat:    Irregular heart rhythm:        Vascular    Pain in calf, thigh, or hip brought on by ambulation:    Pain in feet at night that wakes you up from your sleep:     Blood clot in your veins:    Leg swelling:         Pulmonary    Oxygen at home:    Productive cough:     Wheezing:  Neurologic    Sudden weakness in arms or legs:     Sudden numbness in arms or legs:     Sudden onset of difficulty speaking or slurred speech:    Temporary loss of vision in one eye:     Problems with dizziness:         Gastrointestinal    Blood in stool:     Vomited blood:         Genitourinary    Burning when urinating:     Blood in urine:        Psychiatric    Major depression:         Hematologic    Bleeding problems:    Problems with blood clotting too easily:        Skin    Rashes or ulcers:        Constitutional    Fever or chills:      PHYSICAL EXAM:   Vitals:   07/21/22 1105  BP: (!) 159/78  Pulse: 72  Resp: 20  Temp: 98.6 F (37 C)  SpO2: 97%  Weight: 173 lb (78.5 kg)  Height: 5\' 10"  (1.778 m)    GENERAL: The patient is a well-nourished male, in no acute distress. The vital signs are documented above. CARDIAC: There is a regular rate and rhythm.  PULMONARY: Non-labored respirations MUSCULOSKELETAL: Right BKA limb guard in place NEUROLOGIC: No focal weakness or paresthesias are detected. SKIN: There are no ulcers or rashes noted. PSYCHIATRIC: The patient has a normal affect.  STUDIES:   I have reviewed the following: +-------+-----------+-----------+------------+------------+  ABI/TBIToday's ABIToday's TBIPrevious ABIPrevious TBI  +-------+-----------+-----------+------------+------------+  Right BKA        BKA        [place]          0.18          +-------+-----------+-----------+------------+------------+  Left  [place]         0.49                  0.27          +-------+-----------+-----------+------------+------------+  Left toe pressure is 83.  Waveforms are monophasic  MEDICAL ISSUES:   He will follow-up in 6 months with left leg duplex    , IV, MD, FACS [facility] [phone] Pager [phone]

## 2022-07-23 ENCOUNTER — Other Ambulatory Visit: Payer: Self-pay

## 2022-07-23 DIAGNOSIS — I7025 Atherosclerosis of native arteries of other extremities with ulceration: Secondary | ICD-10-CM

## 2022-07-24 ENCOUNTER — Ambulatory Visit (INDEPENDENT_AMBULATORY_CARE_PROVIDER_SITE_OTHER): Payer: 59 | Admitting: Orthopedic Surgery

## 2022-07-24 DIAGNOSIS — Z89511 Acquired absence of right leg below knee: Secondary | ICD-10-CM

## 2022-07-24 DIAGNOSIS — S88111A Complete traumatic amputation at level between knee and ankle, right lower leg, initial encounter: Secondary | ICD-10-CM

## 2022-07-24 MED ORDER — SULFAMETHOXAZOLE-TRIMETHOPRIM 800-160 MG PO TABS: 1.0000 | ORAL_TABLET | Freq: Two times a day (BID) | ORAL | 0 refills | Status: DC

## 2022-07-25 ENCOUNTER — Other Ambulatory Visit: Payer: Self-pay | Admitting: Orthopedic Surgery

## 2022-07-25 ENCOUNTER — Telehealth: Payer: Self-pay | Admitting: Orthopedic Surgery

## 2022-07-25 MED ORDER — DOXYCYCLINE HYCLATE 100 MG PO TABS: 100.0000 mg | ORAL_TABLET | Freq: Two times a day (BID) | ORAL | 0 refills | Status: DC

## 2022-07-25 NOTE — Telephone Encounter (Signed)
Pt given rx for Bactrim DS yesterday. He is s/p BKA revision and states that he is allergic to sulfur please advise.

## 2022-07-25 NOTE — Telephone Encounter (Signed)
Pt's wife [name] called about a medication issue. She states pt is allergic to sulfur and antibiotics and Dr [name] prescribed a script with sulfur. Please call pt's wife at [phone].

## 2022-07-25 NOTE — Telephone Encounter (Signed)
I called and sw pt's wife to advise will call with any other questions.

## 2022-07-27 ENCOUNTER — Encounter: Payer: Self-pay | Admitting: Orthopedic Surgery

## 2022-07-27 NOTE — Progress Notes (Signed)
Office Visit Note   Patient: Clinton [name]           Date of Birth: 06-30-1956           MRN: [id number] Visit Date: 07/24/2022              Requested by: Lujean Amel, MD [place],  [place] [address] PCP: Lujean Amel, MD  Chief Complaint  Patient presents with   Right Leg - Routine Post Op    06/20/2022 revision right BKA       HPI: Patient is a 66 year old gentleman status post revision right below-knee amputation.  Patient still has persistent clear serosanguineous drainage.  Patient is completed his course of antibiotics of doxycycline.  Assessment & Plan: Visit Diagnoses:  1. Below-knee amputation of right lower extremity ([place])     Plan: Discussions with patient he stated he did not have drug allergies and Bactrim DS was called in.  Patient states that he does have a reaction to sulfa and we renewed his prescription for doxycycline.  Patient will continue with Dial soap cleansing dry dressing changes and compression.  Patient is on Plavix and baby aspirin that is contributing to the drainage.  Follow-Up Instructions: Return in about 1 week (around 07/31/2022).   Ortho Exam  Patient is alert, oriented, no adenopathy, well-dressed, normal affect, normal respiratory effort. Examination there is a small open wound over the residual limb.  This measures 1 x 0.5 cm and 0.5 cm deep.  There is redness dorsally over the bone.    Imaging: No results found.   Labs: Lab Results  Component Value Date   HGBA1C 5.4 04/15/2022   ESRSEDRATE 14 04/08/2022   CRP 2.1 04/08/2022   REPTSTATUS 06/25/2022 FINAL 06/20/2022   GRAMSTAIN  06/20/2022    ABUNDANT WBC PRESENT,BOTH PMN AND MONONUCLEAR NO ORGANISMS SEEN    CULT  06/20/2022    RARE HAEMOPHILUS PARAINFLUENZAE BETA LACTAMASE NEGATIVE FEW GEMELLA MORBILLORUM Standardized susceptibility testing for this organism is not available. Performed at [facility], [place] [address],  [place], [place] [address]      Lab Results  Component Value Date   ALBUMIN 3.6 06/20/2022   ALBUMIN 2.3 (L) 05/15/2022   ALBUMIN 3.9 08/27/2015    No results found for: "MG" No results found for: "VD25OH"  No results found for: "PREALBUMIN"    Latest Ref Rng & Units 06/20/2022   11:45 AM 05/15/2022    5:14 AM 05/11/2022    4:07 AM  CBC EXTENDED  WBC 4.0 - 10.5 K/uL 6.3  5.6  9.4   RBC 4.22 - 5.81 MIL/uL 3.85  2.55  2.88   Hemoglobin 13.0 - 17.0 g/dL 12.2  8.1  9.3   HCT 39.0 - 52.0 % 36.3  24.2  28.0   Platelets 150 - 400 K/uL 231  218  303   NEUT# 1.7 - 7.7 K/uL 4.2  3.6    Lymph# 0.7 - 4.0 K/uL 1.5  1.3       There is no height or weight on file to calculate BMI.  Orders:  No orders of the defined types were placed in this encounter.  Meds ordered this encounter  Medications   sulfamethoxazole-trimethoprim (BACTRIM DS) 800-160 MG tablet    Sig: Take 1 tablet by mouth 2 (two) times daily.    Dispense:  30 tablet    Refill:  0     Procedures: No procedures performed  Clinical Data:  No additional findings.  ROS:  All other systems negative, except as noted in the HPI. Review of Systems  Objective: Vital Signs: There were no vitals taken for this visit.  Specialty Comments:  No specialty comments available.  PMFS History: Patient Active Problem List   Diagnosis Date Noted   Dehiscence of amputation stump of right lower extremity ([employer]) 06/20/2022   Acute blood loss anemia 05/19/2022   Post-op pain 05/19/2022   Acute renal failure ([place]) 05/19/2022   S/P BKA (below knee amputation), right ([place]) 05/14/2022   Below-knee amputation of right lower extremity ([place]) 05/09/2022   Gangrene of right foot (HCC)    PAD (peripheral artery disease) ([place]) 04/17/2022   Acute osteomyelitis of metatarsal bone of right foot (HCC)    Atherosclerosis of native arteries of the extremities with ulceration ([place]) 04/15/2022   Peripheral arterial disease ([place]) 03/25/2022    Obesity 03/21/2022   Prediabetes 03/21/2022   Erectile dysfunction 07/26/2021   Family history of ischemic heart disease (IHD) 07/26/2021   Pure hypercholesterolemia 07/26/2021   History of adenomatous polyp of colon 02/03/2019   Internal hemorrhoids 02/03/2019   Carotid artery stenosis, asymptomatic 09/05/2015   Cellulitis    Hypertension    Hyperlipidemia    Occlusion and stenosis of carotid artery without mention of cerebral infarction 01/01/2012   Past Medical History:  Diagnosis Date   Carotid artery occlusion    Cellulitis    Erectile dysfunction    Hyperlipidemia    Hypertension    Peripheral vascular disease ([place])     Family History  Problem Relation Age of Onset   Other Mother        circulatory problems   Heart attack Mother    Deep vein thrombosis Mother    Heart disease Mother    Hyperlipidemia Father    Hypertension Father    Diabetes Father    Deep vein thrombosis Father    Heart disease Father        before age 27   Heart attack Father    Peripheral vascular disease Father    Hypertension Brother    Hyperlipidemia Brother     Past Surgical History:  Procedure Laterality Date   ABDOMINAL AORTOGRAM W/LOWER EXTREMITY N/A 02/04/2022   Procedure: ABDOMINAL AORTOGRAM W/LOWER EXTREMITY;  Surgeon: Serafina Mitchell, MD;  Location: [place] [facility];  Service: Cardiovascular;  Laterality: N/A;   ABDOMINAL AORTOGRAM W/LOWER EXTREMITY N/A 02/18/2022   Procedure: ABDOMINAL AORTOGRAM W/LOWER EXTREMITY;  Surgeon: Serafina Mitchell, MD;  Location: [place] [facility];  Service: Cardiovascular;  Laterality: N/A;   ABDOMINAL AORTOGRAM W/LOWER EXTREMITY N/A 04/15/2022   Procedure: ABDOMINAL AORTOGRAM W/LOWER EXTREMITY;  Surgeon: Serafina Mitchell, MD;  Location: [place] [facility];  Service: Cardiovascular;  Laterality: N/A;   AMPUTATION Right 04/16/2022   Procedure: RIGHT 5TH RAY AMPUTATION;  Surgeon: Newt Minion, MD;  Location: [facility];  Service: Orthopedics;  Laterality:  Right;   AMPUTATION Right 05/09/2022   Procedure: RIGHT BELOW KNEE AMPUTATION;  Surgeon: Newt Minion, MD;  Location: [facility];  Service: Orthopedics;  Laterality: Right;   APPLICATION OF WOUND VAC Right 06/20/2022   Procedure: APPLICATION OF WOUND VAC;  Surgeon: Newt Minion, MD;  Location: [facility];  Service: Orthopedics;  Laterality: Right;   COLONOSCOPY     ENDARTERECTOMY Right 09/05/2015   Procedure: ENDARTERECTOMY CAROTID RIGHT;  Surgeon: Elam Dutch, MD;  Location: [facility];  Service: Vascular;  Laterality: Right;   PATCH ANGIOPLASTY  09/05/2015   Procedure: PATCH ANGIOPLASTY RIGHT CAROTID ARTERY USING HEMASHIELD PLATINUM FINESSE PATCH;  Surgeon: Sherren Kerns, MD;  Location: [facility] OR;  Service: Vascular;;   PERIPHERAL VASCULAR ATHERECTOMY Left 02/18/2022   Procedure: PERIPHERAL VASCULAR ATHERECTOMY;  Surgeon: Nada Libman, MD;  [facility];  Service: Cardiovascular;  Laterality: Left;  Popliteal and Peroneal   PERIPHERAL VASCULAR BALLOON ANGIOPLASTY  02/04/2022   Procedure: PERIPHERAL VASCULAR BALLOON ANGIOPLASTY;  Surgeon: Nada Libman, MD;  [facility];  Service: Cardiovascular;;   PERIPHERAL VASCULAR BALLOON ANGIOPLASTY Right 04/15/2022   Procedure: PERIPHERAL VASCULAR BALLOON ANGIOPLASTY;  Surgeon: Nada Libman, MD;  [facility];  Service: Cardiovascular;  Laterality: Right;  SFA/POPLITEAL   STUMP REVISION Right 06/20/2022   Procedure: REVISION RIGHT BELOW KNEE AMPUTATION;  Surgeon: Nadara Mustard, MD;  Location: [facility] OR;  Service: Orthopedics;  Laterality: Right;   TONSILLECTOMY     VASECTOMY     Social History   Occupational History   Not on file  Tobacco Use   Smoking status: Never   Smokeless tobacco: Former    Types: Chew    Quit date: 01/01/2015  Vaping Use   Vaping Use: Never used  Substance and Sexual Activity   Alcohol use: Not Currently   Drug use: Not on file   Sexual activity: Not on file

## 2022-07-29 ENCOUNTER — Encounter: Payer: 59 | Admitting: Physical Therapy

## 2022-07-31 ENCOUNTER — Ambulatory Visit (INDEPENDENT_AMBULATORY_CARE_PROVIDER_SITE_OTHER): Payer: 59 | Admitting: Orthopedic Surgery

## 2022-07-31 DIAGNOSIS — S88111A Complete traumatic amputation at level between knee and ankle, right lower leg, initial encounter: Secondary | ICD-10-CM

## 2022-07-31 DIAGNOSIS — T8781 Dehiscence of amputation stump: Secondary | ICD-10-CM

## 2022-07-31 DIAGNOSIS — Z89511 Acquired absence of right leg below knee: Secondary | ICD-10-CM

## 2022-08-01 ENCOUNTER — Encounter: Payer: Self-pay | Admitting: Orthopedic Surgery

## 2022-08-01 NOTE — Progress Notes (Signed)
Office Visit Note   Patient: Clinton [name]           Date of Birth: 04/28/1956           MRN: [id number] Visit Date: 07/31/2022              Requested by: Darrow Bussing, MD [address] [place],  [place] [address] PCP: Darrow Bussing, MD  Chief Complaint  Patient presents with   Right Leg - Routine Post Op    06/20/2022 revision right BKA donation skin graft at last visit       HPI: Patient is a 66 year old gentleman who is status post right transtibial amputation revision approximately 5 weeks ago.  Patient had application of donated Kerecis tissue graft last week.  He was on doxycycline.  Assessment & Plan: Visit Diagnoses:  1. Below-knee amputation of right lower extremity (HCC)   2. Dehiscence of amputation stump (HCC)     Plan: Continue with wound care and his compression shrinker.  Patient states that he is going to follow-up with Dr. Gery Pray for a recent cardiac test.  Follow-Up Instructions: Return in about 2 weeks (around 08/14/2022).   Ortho Exam  Patient is alert, oriented, no adenopathy, well-dressed, normal affect, normal respiratory effort. Examination patient has improved granulation tissue in the wound bed.  The wound is currently 10 mm in diameter and 3 mm deep.  There is clear serosanguineous drainage.  Fibrinous tissue was debrided and there is healthy granulation tissue at the base.  Imaging: No results found.   Labs: Lab Results  Component Value Date   HGBA1C 5.4 04/15/2022   ESRSEDRATE 14 04/08/2022   CRP 2.1 04/08/2022   REPTSTATUS 06/25/2022 FINAL 06/20/2022   GRAMSTAIN  06/20/2022    ABUNDANT WBC PRESENT,BOTH PMN AND MONONUCLEAR NO ORGANISMS SEEN    CULT  06/20/2022    RARE HAEMOPHILUS PARAINFLUENZAE BETA LACTAMASE NEGATIVE FEW GEMELLA MORBILLORUM Standardized susceptibility testing for this organism is not available. Performed at [facility], [address] [place], [place] [address]      Lab Results   Component Value Date   ALBUMIN 3.6 06/20/2022   ALBUMIN 2.3 (L) 05/15/2022   ALBUMIN 3.9 08/27/2015    No results found for: "MG" No results found for: "VD25OH"  No results found for: "PREALBUMIN"    Latest Ref Rng & Units 06/20/2022   11:45 AM 05/15/2022    5:14 AM 05/11/2022    4:07 AM  CBC EXTENDED  WBC 4.0 - 10.5 K/uL 6.3  5.6  9.4   RBC 4.22 - 5.81 MIL/uL 3.85  2.55  2.88   Hemoglobin 13.0 - 17.0 g/dL 10.1  8.1  9.3   HCT 75.1 - 52.0 % 36.3  24.2  28.0   Platelets 150 - 400 K/uL 231  218  303   NEUT# 1.7 - 7.7 K/uL 4.2  3.6    Lymph# 0.7 - 4.0 K/uL 1.5  1.3       There is no height or weight on file to calculate BMI.  Orders:  No orders of the defined types were placed in this encounter.  No orders of the defined types were placed in this encounter.    Procedures: No procedures performed  Clinical Data: No additional findings.  ROS:  All other systems negative, except as noted in the HPI. Review of Systems  Objective: Vital Signs: There were no vitals taken for this visit.  Specialty Comments:  No specialty comments  available.  PMFS History: Patient Active Problem List   Diagnosis Date Noted   Dehiscence of amputation stump of right lower extremity (HCC) 06/20/2022   Acute blood loss anemia 05/19/2022   Post-op pain 05/19/2022   Acute renal failure (HCC) 05/19/2022   S/P BKA (below knee amputation), right (HCC) 05/14/2022   Below-knee amputation of right lower extremity (HCC) 05/09/2022   Gangrene of right foot (HCC)    PAD (peripheral artery disease) (HCC) 04/17/2022   Acute osteomyelitis of metatarsal bone of right foot (HCC)    Atherosclerosis of native arteries of the extremities with ulceration (HCC) 04/15/2022   Peripheral arterial disease (HCC) 03/25/2022   Obesity 03/21/2022   Prediabetes 03/21/2022   Erectile dysfunction 07/26/2021   Family history of ischemic heart disease (IHD) 07/26/2021   Pure hypercholesterolemia 07/26/2021    History of adenomatous polyp of colon 02/03/2019   Internal hemorrhoids 02/03/2019   Carotid artery stenosis, asymptomatic 09/05/2015   Cellulitis    Hypertension    Hyperlipidemia    Occlusion and stenosis of carotid artery without mention of cerebral infarction 01/01/2012   Past Medical History:  Diagnosis Date   Carotid artery occlusion    Cellulitis    Erectile dysfunction    Hyperlipidemia    Hypertension    Peripheral vascular disease (HCC)     Family History  Problem Relation Age of Onset   Other Mother        circulatory problems   Heart attack Mother    Deep vein thrombosis Mother    Heart disease Mother    Hyperlipidemia Father    Hypertension Father    Diabetes Father    Deep vein thrombosis Father    Heart disease Father        before age 103   Heart attack Father    Peripheral vascular disease Father    Hypertension Brother    Hyperlipidemia Brother     Past Surgical History:  Procedure Laterality Date   ABDOMINAL AORTOGRAM W/LOWER EXTREMITY N/A 02/04/2022   Procedure: ABDOMINAL AORTOGRAM W/LOWER EXTREMITY;  Surgeon: Nada Libman, MD;  [facility];  Service: Cardiovascular;  Laterality: N/A;   ABDOMINAL AORTOGRAM W/LOWER EXTREMITY N/A 02/18/2022   Procedure: ABDOMINAL AORTOGRAM W/LOWER EXTREMITY;  Surgeon: Nada Libman, MD;  [facility];  Service: Cardiovascular;  Laterality: N/A;   ABDOMINAL AORTOGRAM W/LOWER EXTREMITY N/A 04/15/2022   Procedure: ABDOMINAL AORTOGRAM W/LOWER EXTREMITY;  Surgeon: Nada Libman, MD;  [facility];  Service: Cardiovascular;  Laterality: N/A;   AMPUTATION Right 04/16/2022   Procedure: RIGHT 5TH RAY AMPUTATION;  Surgeon: Nadara Mustard, MD;  Location: [facility] OR;  Service: Orthopedics;  Laterality: Right;   AMPUTATION Right 05/09/2022   Procedure: RIGHT BELOW KNEE AMPUTATION;  Surgeon: Nadara Mustard, MD;  Location: [facility] OR;  Service: Orthopedics;  Laterality: Right;   APPLICATION OF  WOUND VAC Right 06/20/2022   Procedure: APPLICATION OF WOUND VAC;  Surgeon: Nadara Mustard, MD;  [facility];  Service: Orthopedics;  Laterality: Right;   COLONOSCOPY     ENDARTERECTOMY Right 09/05/2015   Procedure: ENDARTERECTOMY CAROTID RIGHT;  Surgeon: Sherren Kerns, MD;  Location: [facility];  Service: Vascular;  Laterality: Right;   PATCH ANGIOPLASTY  09/05/2015   Procedure: PATCH ANGIOPLASTY RIGHT CAROTID ARTERY USING HEMASHIELD PLATINUM FINESSE PATCH;  Surgeon: Sherren Kerns, MD;  Location: [facility] OR;  Service: Vascular;;   PERIPHERAL VASCULAR ATHERECTOMY Left 02/18/2022   Procedure:  PERIPHERAL VASCULAR ATHERECTOMY;  Surgeon: Nada Libman, MD;  [facility];  Service: Cardiovascular;  Laterality: Left;  Popliteal and Peroneal   PERIPHERAL VASCULAR BALLOON ANGIOPLASTY  02/04/2022   Procedure: PERIPHERAL VASCULAR BALLOON ANGIOPLASTY;  Surgeon: Nada Libman, MD;  [facility];  Service: Cardiovascular;;   PERIPHERAL VASCULAR BALLOON ANGIOPLASTY Right 04/15/2022   Procedure: PERIPHERAL VASCULAR BALLOON ANGIOPLASTY;  Surgeon: Nada Libman, MD;  [facility];  Service: Cardiovascular;  Laterality: Right;  SFA/POPLITEAL   STUMP REVISION Right 06/20/2022   Procedure: REVISION RIGHT BELOW KNEE AMPUTATION;  Surgeon: Nadara Mustard, MD;  Location: [facility] OR;  Service: Orthopedics;  Laterality: Right;   TONSILLECTOMY     VASECTOMY     Social History   Occupational History   Not on file  Tobacco Use   Smoking status: Never   Smokeless tobacco: Former    Types: Chew    Quit date: 01/01/2015  Vaping Use   Vaping Use: Never used  Substance and Sexual Activity   Alcohol use: Not Currently   Drug use: Not on file   Sexual activity: Not on file

## 2022-08-06 ENCOUNTER — Telehealth: Payer: Self-pay | Admitting: Cardiovascular Disease

## 2022-08-06 ENCOUNTER — Ambulatory Visit: Payer: 59 | Attending: Cardiovascular Disease | Admitting: Cardiovascular Disease

## 2022-08-06 ENCOUNTER — Encounter: Payer: Self-pay | Admitting: Cardiovascular Disease

## 2022-08-06 VITALS — BP 132/77 | HR 72 | Ht 70.0 in | Wt 176.0 lb

## 2022-08-06 DIAGNOSIS — I739 Peripheral vascular disease, unspecified: Secondary | ICD-10-CM | POA: Diagnosis not present

## 2022-08-06 DIAGNOSIS — R931 Abnormal findings on diagnostic imaging of heart and coronary circulation: Secondary | ICD-10-CM

## 2022-08-06 DIAGNOSIS — I10 Essential (primary) hypertension: Secondary | ICD-10-CM | POA: Diagnosis not present

## 2022-08-06 DIAGNOSIS — E782 Mixed hyperlipidemia: Secondary | ICD-10-CM

## 2022-08-06 NOTE — Assessment & Plan Note (Signed)
History of peripheral arterial disease with critical ischemia status post bilateral lower extremity intervention by [name].  He recently had a right below-knee amputation by Dr. [name].

## 2022-08-06 NOTE — Patient Instructions (Signed)
Medication Instructions:  Your physician recommends that you continue on your current medications as directed. Please refer to the Current Medication list given to you today.  *If you need a refill on your cardiac medications before your next appointment, please call your pharmacy*   Follow-Up: At [facility], you and your health needs are our priority.  As part of our continuing mission to provide you with exceptional heart care, we have created designated Provider Care Teams.  These Care Teams include your primary Cardiologist (physician) and Advanced Practice Providers (APPs -  Physician Assistants and Nurse Practitioners) who all work together to provide you with the care you need, when you need it.  We recommend signing up for the patient portal called "MyChart".  Sign up information is provided on this After Visit Summary.  MyChart is used to connect with patients for Virtual Visits (Telemedicine).  Patients are able to view lab/test results, encounter notes, upcoming appointments, etc.  Non-urgent messages can be sent to your provider as well.   To learn more about what you can do with MyChart, go to [email or web address].com.    Your next appointment:   12 month(s)  The format for your next appointment:   In Person  Provider:   Jonathan Berry, MD   

## 2022-08-06 NOTE — Assessment & Plan Note (Signed)
History of hyperlipidemia on statin therapy with lipid profile performed 04/16/2022 revealing total cholesterol 107, LDL 58 and HDL 37.

## 2022-08-06 NOTE — Assessment & Plan Note (Signed)
Coronary calcium score performed 07/01/2022 was 1756 the majority of which was in the LAD and RCA.  He is completely asymptomatic however.  I did do a Lexiscan Myoview stress test on him 07/18/2022 which was entirely normal.  2D echo as well was normal.  He is at goal for secondary prevention with regards to his lipid profile.

## 2022-08-06 NOTE — Telephone Encounter (Signed)
Discuss with Dr Allyson Sabal, he states that left ventricular function is normal.  Returned call to wife, notified of above. She is also asking "what about the right"? Per echo:  Right ventricular systolic function is normal. Verbalized understanding

## 2022-08-06 NOTE — Assessment & Plan Note (Signed)
History of elective left carotid endarterectomy performed by [name] 09/05/2015.  Dopplers are followed by his office.

## 2022-08-06 NOTE — Telephone Encounter (Signed)
Patient's wife states they have a  grandson with left ventricle non compaction and they would like to know if the patient has this.

## 2022-08-06 NOTE — Progress Notes (Signed)
08/06/2022 Clinton [name]   Nov 15, 1955  [id number]  Primary Physician Clinton Bussing, [name] Primary Cardiologist: Clinton [name] Clinton [name], [place], [place]  HPI:  Clinton [name] is a 66 y.o.   mildly overweight married Caucasian male father of 2 children, grandfather to 3 grandchildren who was originally referred by Dr. Darrick [name] for cardiovascular clearance before elective right carotid endarterectomy scheduled for 09/05/15.  I last saw him in the office 03/25/2022.  He is accompanied by his wife Clinton [name] today.  His cardiovascular risk factors are notable for treated hypertension, hyperlipidemia and family history with a father who died of a myocardial infarction at age 64 and a mother at age 72.  His younger brother had CABG several years ago.  He has never had a heart attack or stroke. He denies chest pain or shortness of breath. 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   He has been seeing Dr. Myra [name] for critical limb ischemia and had endovascular therapy of his right popliteal artery and most recently on 02/18/2022, his left popliteal artery and peroneal artery.  His wounds are healing.  Dr. Myra [name] follows him by duplex ultrasound.  Prior to this he was very active working at in [employer] daily and doing yard work for 5 to 6 hours a day.  During that time, he denies chest pain or shortness of breath.  He is very concerned about his family history and wishes to pursue cardiac evaluation.  Since I saw him 4 months ago he did undergo right below the knee amputation by Dr. Lajoyce [name] for critical limb ischemia.  He has not yet been fitted with a prosthesis.  He did have a coronary calcium score performed 07/01/2022 which was 1756, the majority of which was in the LAD and RCA.  He is completely asymptomatic.  A subsequent Myoview stress test performed 07/18/2022 was entirely normal as was a 2D echocardiogram.     Current Meds  Medication Sig   acetaminophen (TYLENOL) 325 MG tablet Take 2  tablets (650 mg total) by mouth 4 (four) times daily -  with meals and at bedtime.   amLODipine (NORVASC) 2.5 MG tablet Take 2.5 mg by mouth at bedtime.   ascorbic acid (VITAMIN C) 1000 MG tablet Take 1 tablet (1,000 mg total) by mouth daily.   aspirin EC 81 MG tablet Take 81 mg by mouth at bedtime.   atorvastatin (LIPITOR) 80 MG tablet Take 80 mg by mouth at bedtime.   clopidogrel (PLAVIX) 75 MG tablet Take 1 tablet (75 mg total) by mouth daily. (Patient taking differently: Take 75 mg by mouth at bedtime.)   doxycycline (VIBRA-TABS) 100 MG tablet Take 1 tablet (100 mg total) by mouth 2 (two) times daily.   doxycycline (VIBRA-TABS) 100 MG tablet Take 1 tablet (100 mg total) by mouth 2 (two) times daily.   irbesartan-hydrochlorothiazide (AVALIDE) 300-12.5 MG tablet Take 1 tablet by mouth at bedtime.   Multiple Vitamin (MULTIVITAMIN) capsule Take 2 capsules by mouth at bedtime. Gummy   Omega-3 Fatty Acids (OMEGA 3 PO) Take 1 capsule by mouth daily.   polyethylene glycol (MIRALAX / GLYCOLAX) 17 g packet Take 17 g by mouth daily as needed for mild constipation.   Probiotic Product (PROBIOTIC PO) Take 1 capsule by mouth daily.   sildenafil (VIAGRA) 100 MG tablet Take 100 mg by mouth as needed for erectile dysfunction.   zinc sulfate 220 (50 Zn) MG capsule Take 1 capsule (220 mg total) by  mouth daily.     Allergies  Allergen Reactions   Penicillins Rash    Has patient had a PCN reaction causing immediate rash, facial/tongue/throat swelling, SOB or lightheadedness with hypotension: Yes Has patient had a PCN reaction causing severe rash involving mucus membranes or skin necrosis: No Has patient had a PCN reaction that required hospitalization No Has patient had a PCN reaction occurring within the last 10 years: No If all of the above answers are "NO", then may proceed with Cephalosporin use.  Tolerated Cefazolin on 04/16/22    Sulfa Antibiotics Swelling and Other (See Comments)    Facial/lip  swelling    Social History   Socioeconomic History   Marital status: Married    Spouse name: Not on file   Number of children: Not on file   Years of education: Not on file   Highest education level: Not on file  Occupational History   Not on file  Tobacco Use   Smoking status: Never   Smokeless tobacco: Former    Types: Chew    Quit date: 01/01/2015  Vaping Use   Vaping Use: Never used  Substance and Sexual Activity   Alcohol use: Not Currently   Drug use: Not on file   Sexual activity: Not on file  Other Topics Concern   Not on file  Social History Narrative   Not on file   Social Determinants of Health   Financial Resource Strain: Not on file  Food Insecurity: No Food Insecurity (05/10/2022)   Hunger Vital Sign    Worried About Running Out of Food in the Last Year: Never true    Ran Out of Food in the Last Year: Never true  Transportation Needs: No Transportation Needs (05/10/2022)   PRAPARE - Administrator, Civil Service (Medical): No    Lack of Transportation (Non-Medical): No  Physical Activity: Not on file  Stress: Not on file  Social Connections: Not on file  Intimate Partner Violence: Not At Risk (05/10/2022)   Humiliation, Afraid, Rape, and Kick questionnaire    Fear of Current or Ex-Partner: No    Emotionally Abused: No    Physically Abused: No    Sexually Abused: No     Review of Systems: General: negative for chills, fever, night sweats or weight changes.  Cardiovascular: negative for chest pain, dyspnea on exertion, edema, orthopnea, palpitations, paroxysmal nocturnal dyspnea or shortness of breath Dermatological: negative for rash Respiratory: negative for cough or wheezing Urologic: negative for hematuria Abdominal: negative for nausea, vomiting, diarrhea, bright red blood per rectum, melena, or hematemesis Neurologic: negative for visual changes, syncope, or dizziness All other systems reviewed and are otherwise negative except as  noted above.    Blood pressure 132/77, pulse 72, height 5\' 10"  (1.778 m), weight 176 lb (79.8 kg), SpO2 100 %.  General appearance: alert and no distress Neck: no adenopathy, no JVD, supple, symmetrical, trachea midline, thyroid not enlarged, symmetric, no tenderness/mass/nodules, and right carotid bruit Lungs: clear to auscultation bilaterally Heart: regular rate and rhythm, S1, S2 normal, no murmur, click, rub or gallop Extremities: Status post right BKA Pulses: Absent left pedal pulse Skin: Skin color, texture, turgor normal. No rashes or lesions Neurologic: Grossly normal  EKG not performed today  ASSESSMENT AND PLAN:   Occlusion and stenosis of carotid artery without mention of cerebral infarction History of elective left carotid endarterectomy performed by Dr. 09/05/2015.  Dopplers are followed by his office.  Hypertension History of essential hypertension  a blood pressure measured today at 132/77.  He is on amlodipine and Avalide.  Hyperlipidemia History of hyperlipidemia on statin therapy with lipid profile performed 04/16/2022 revealing total cholesterol 107, LDL 58 and HDL 37.  Peripheral arterial disease (HCC) History of peripheral arterial disease with critical ischemia status post bilateral lower extremity intervention by Dr. Myra [name].  He recently had a right below-knee amputation by Dr. Lajoyce [name].  Elevated coronary artery calcium score Coronary calcium score performed 07/01/2022 was 1756 the majority of which was in the LAD and RCA.  He is completely asymptomatic however.  I did do a Lexiscan Myoview stress test on him 07/18/2022 which was entirely normal.  2D echo as well was normal.  He is at goal for secondary prevention with regards to his lipid profile.     Clinton [name] FACP,FACC,FAHA, [facility] 08/06/2022 3:38 PM

## 2022-08-06 NOTE — Assessment & Plan Note (Signed)
History of essential hypertension a blood pressure measured today at 132/77.  He is on amlodipine and Avalide.

## 2022-08-18 ENCOUNTER — Encounter: Payer: Self-pay | Admitting: Surgery

## 2022-08-18 ENCOUNTER — Ambulatory Visit (INDEPENDENT_AMBULATORY_CARE_PROVIDER_SITE_OTHER): Payer: 59 | Admitting: Orthopedic Surgery

## 2022-08-18 DIAGNOSIS — S88111A Complete traumatic amputation at level between knee and ankle, right lower leg, initial encounter: Secondary | ICD-10-CM

## 2022-08-18 DIAGNOSIS — Z89511 Acquired absence of right leg below knee: Secondary | ICD-10-CM

## 2022-08-21 ENCOUNTER — Other Ambulatory Visit: Payer: Self-pay | Admitting: Orthopedic Surgery

## 2022-08-21 ENCOUNTER — Encounter: Payer: 59 | Admitting: Orthopedic Surgery

## 2022-08-22 ENCOUNTER — Encounter: Payer: Self-pay | Admitting: Orthopedic Surgery

## 2022-08-22 NOTE — Progress Notes (Signed)
Office Visit Note   Patient: Clinton [name]           Date of Birth: Oct 11, 1955           MRN: [id number] Visit Date: 08/18/2022              Requested by: Lujean Amel, MD [address],  [place] [id number] PCP: Lujean Amel, MD  Chief Complaint  Patient presents with   Right Leg - Routine Post Op    06/20/2022 revision right BKA donation skin graft at last visit       HPI: Patient is a 67 year old gentleman who is seen in follow-up status post revision right below-knee amputation 2 months ago.  Patient states that he just had a cardiac exam that went well.  He will finish antibiotics in 3 days.  Assessment & Plan: Visit Diagnoses:  1. Below-knee amputation of right lower extremity ([place])     Plan: Continue with the stump shrinker  Follow-Up Instructions: Return in about 4 weeks (around 09/15/2022).   Ortho Exam  Patient is alert, oriented, no adenopathy, well-dressed, normal affect, normal respiratory effort. Examination the wound edges are healing well he has a 10 mm wound with healthy granulation tissue and no depth.  Imaging: No results found.   Labs: Lab Results  Component Value Date   HGBA1C 5.4 04/15/2022   ESRSEDRATE 14 04/08/2022   CRP 2.1 04/08/2022   REPTSTATUS 06/25/2022 FINAL 06/20/2022   GRAMSTAIN  06/20/2022    ABUNDANT WBC PRESENT,BOTH PMN AND MONONUCLEAR NO ORGANISMS SEEN    CULT  06/20/2022    RARE HAEMOPHILUS PARAINFLUENZAE BETA LACTAMASE NEGATIVE FEW GEMELLA MORBILLORUM Standardized susceptibility testing for this organism is not available. Performed at [facility], [place] [address] [place], [place] [address]      Lab Results  Component Value Date   ALBUMIN 3.6 06/20/2022   ALBUMIN 2.3 (L) 05/15/2022   ALBUMIN 3.9 08/27/2015    No results found for: "MG" No results found for: "VD25OH"  No results found for: "PREALBUMIN"    Latest Ref Rng & Units 06/20/2022   11:45 AM 05/15/2022    5:14  AM 05/11/2022    4:07 AM  CBC EXTENDED  WBC 4.0 - 10.5 K/uL 6.3  5.6  9.4   RBC 4.22 - 5.81 MIL/uL 3.85  2.55  2.88   Hemoglobin 13.0 - 17.0 g/dL 12.2  8.1  9.3   HCT 39.0 - 52.0 % 36.3  24.2  28.0   Platelets 150 - 400 K/uL 231  218  303   NEUT# 1.7 - 7.7 K/uL 4.2  3.6    Lymph# 0.7 - 4.0 K/uL 1.5  1.3       There is no height or weight on file to calculate BMI.  Orders:  No orders of the defined types were placed in this encounter.  No orders of the defined types were placed in this encounter.    Procedures: No procedures performed  Clinical Data: No additional findings.  ROS:  All other systems negative, except as noted in the HPI. Review of Systems  Objective: Vital Signs: There were no vitals taken for this visit.  Specialty Comments:  No specialty comments available.  PMFS History: Patient Active Problem List   Diagnosis Date Noted   Elevated coronary artery calcium score 08/06/2022   Dehiscence of amputation stump of right lower extremity ([place]) 06/20/2022   Acute blood loss anemia 05/19/2022   Post-op pain  05/19/2022   Acute renal failure ([place]) 05/19/2022   S/P BKA (below knee amputation), right ([place]) 05/14/2022   Below-knee amputation of right lower extremity ([place]) 05/09/2022   Gangrene of right foot (HCC)    PAD (peripheral artery disease) ([place]) 04/17/2022   Acute osteomyelitis of metatarsal bone of right foot (HCC)    Atherosclerosis of native arteries of the extremities with ulceration ([place]) 04/15/2022   Peripheral arterial disease ([place]) 03/25/2022   Obesity 03/21/2022   Prediabetes 03/21/2022   Erectile dysfunction 07/26/2021   Family history of ischemic heart disease (IHD) 07/26/2021   Pure hypercholesterolemia 07/26/2021   History of adenomatous polyp of colon 02/03/2019   Internal hemorrhoids 02/03/2019   Carotid artery stenosis, asymptomatic 09/05/2015   Cellulitis    Hypertension    Hyperlipidemia    Occlusion and stenosis of carotid  artery without mention of cerebral infarction 01/01/2012   Past Medical History:  Diagnosis Date   Carotid artery occlusion    Cellulitis    Erectile dysfunction    Hyperlipidemia    Hypertension    Peripheral vascular disease ([place])     Family History  Problem Relation Age of Onset   Other Mother        circulatory problems   Heart attack Mother    Deep vein thrombosis Mother    Heart disease Mother    Hyperlipidemia Father    Hypertension Father    Diabetes Father    Deep vein thrombosis Father    Heart disease Father        before age 69   Heart attack Father    Peripheral vascular disease Father    Hypertension Brother    Hyperlipidemia Brother     Past Surgical History:  Procedure Laterality Date   ABDOMINAL AORTOGRAM W/LOWER EXTREMITY N/A 02/04/2022   Procedure: ABDOMINAL AORTOGRAM W/LOWER EXTREMITY;  Surgeon: Serafina Mitchell, MD;  Location: [facility];  Service: Cardiovascular;  Laterality: N/A;   ABDOMINAL AORTOGRAM W/LOWER EXTREMITY N/A 02/18/2022   Procedure: ABDOMINAL AORTOGRAM W/LOWER EXTREMITY;  Surgeon: Serafina Mitchell, MD;  Location: [facility];  Service: Cardiovascular;  Laterality: N/A;   ABDOMINAL AORTOGRAM W/LOWER EXTREMITY N/A 04/15/2022   Procedure: ABDOMINAL AORTOGRAM W/LOWER EXTREMITY;  Surgeon: Serafina Mitchell, MD;  Location: [facility];  Service: Cardiovascular;  Laterality: N/A;   AMPUTATION Right 04/16/2022   Procedure: RIGHT 5TH RAY AMPUTATION;  Surgeon: Newt Minion, MD;  Location: [place];  Service: Orthopedics;  Laterality: Right;   AMPUTATION Right 05/09/2022   Procedure: RIGHT BELOW KNEE AMPUTATION;  Surgeon: Newt Minion, MD;  Location: [place];  Service: Orthopedics;  Laterality: Right;   APPLICATION OF WOUND VAC Right 06/20/2022   Procedure: APPLICATION OF WOUND VAC;  Surgeon: Newt Minion, MD;  Location: [place];  Service: Orthopedics;  Laterality: Right;   COLONOSCOPY     ENDARTERECTOMY Right 09/05/2015   Procedure:  ENDARTERECTOMY CAROTID RIGHT;  Surgeon: Elam Dutch, MD;  Location: [facility];  Service: Vascular;  Laterality: Right;   PATCH ANGIOPLASTY  09/05/2015   Procedure: PATCH ANGIOPLASTY RIGHT CAROTID ARTERY USING HEMASHIELD PLATINUM FINESSE PATCH;  Surgeon: Elam Dutch, MD;  Location: [place];  Service: Vascular;;   PERIPHERAL VASCULAR ATHERECTOMY Left 02/18/2022   Procedure: PERIPHERAL VASCULAR ATHERECTOMY;  Surgeon: Serafina Mitchell, MD;  Location: [place] [facility];  Service: Cardiovascular;  Laterality: Left;  Popliteal and Peroneal   PERIPHERAL VASCULAR BALLOON ANGIOPLASTY  02/04/2022   Procedure:  PERIPHERAL VASCULAR BALLOON ANGIOPLASTY;  Surgeon: Nada Libman, MD;  Location: [facility];  Service: Cardiovascular;;   PERIPHERAL VASCULAR BALLOON ANGIOPLASTY Right 04/15/2022   Procedure: PERIPHERAL VASCULAR BALLOON ANGIOPLASTY;  Surgeon: Nada Libman, MD;  Location: [facility];  Service: Cardiovascular;  Laterality: Right;  SFA/POPLITEAL   STUMP REVISION Right 06/20/2022   Procedure: REVISION RIGHT BELOW KNEE AMPUTATION;  Surgeon: Nadara Mustard, MD;  Location[facility];  Service: Orthopedics;  Laterality: Right;   TONSILLECTOMY     VASECTOMY     Social History   Occupational History   Not on file  Tobacco Use   Smoking status: Never   Smokeless tobacco: Former    Types: Chew    Quit date: 01/01/2015  Vaping Use   Vaping Use: Never used  Substance and Sexual Activity   Alcohol use: Not Currently   Drug use: Not on file   Sexual activity: Not on file

## 2022-08-25 ENCOUNTER — Encounter: Payer: 59 | Admitting: Physical Therapy

## 2022-08-27 ENCOUNTER — Encounter: Payer: Self-pay | Admitting: Orthopedic Surgery

## 2022-08-31 ENCOUNTER — Encounter: Payer: Self-pay | Admitting: Orthopedic Surgery

## 2022-09-15 ENCOUNTER — Ambulatory Visit (INDEPENDENT_AMBULATORY_CARE_PROVIDER_SITE_OTHER): Payer: 59 | Admitting: Orthopedic Surgery

## 2022-09-15 ENCOUNTER — Encounter: Payer: Self-pay | Admitting: Orthopedic Surgery

## 2022-09-15 DIAGNOSIS — Z89512 Acquired absence of left leg below knee: Secondary | ICD-10-CM

## 2022-09-15 DIAGNOSIS — S88111A Complete traumatic amputation at level between knee and ankle, right lower leg, initial encounter: Secondary | ICD-10-CM

## 2022-09-15 NOTE — Progress Notes (Addendum)
Office Visit Note   Patient: Clinton [name]           Date of Birth: 01/08/1956           MRN: [id number] Visit Date: 09/15/2022              Requested by: Lujean Amel, MD [place],  [place] [id number] PCP: Lujean Amel, MD  Chief Complaint  Patient presents with   Right Leg - Routine Post Op    06/20/2022 revision right BKA       HPI: Patient is a 67 year old gentleman who presents in follow-up for right transtibial amputation status post revision in November.  He is 2 months out.  Assessment & Plan: Visit Diagnoses:  1. Below-knee amputation of right lower extremity [facility])     Plan: Patient has an appointment with [place] and Gerald Stabs on Wednesday for casting.  Patient has a follow-up appointment with Shirlean Mylar as well for gait training.  Follow-Up Instructions: Return in about 2 months (around 11/14/2022).   Ortho Exam  Patient is alert, oriented, no adenopathy, well-dressed, normal affect, normal respiratory effort. Examination the residual limb is well consolidated there is a little swelling no cellulitis odor or drainage no tenderness to palpation.  Patient is a new right transtibial  amputee.  Patient's current comorbidities are not expected to impact the ability to function with the prescribed prosthesis. Patient verbally communicates a strong desire to use a prosthesis. Patient currently requires mobility aids to ambulate without a prosthesis.  Expects not to use mobility aids with a new prosthesis.  Patient is a K2 level ambulator that will use a prosthesis to walk around their home and the community over low level environmental barriers.      Imaging: No results found.   Labs: Lab Results  Component Value Date   HGBA1C 5.4 04/15/2022   ESRSEDRATE 14 04/08/2022   CRP 2.1 04/08/2022   REPTSTATUS 06/25/2022 FINAL 06/20/2022   GRAMSTAIN  06/20/2022    ABUNDANT WBC PRESENT,BOTH PMN AND MONONUCLEAR NO ORGANISMS SEEN     CULT  06/20/2022    RARE HAEMOPHILUS PARAINFLUENZAE BETA LACTAMASE NEGATIVE FEW GEMELLA MORBILLORUM Standardized susceptibility testing for this organism is not available. Performed at [facility], [place] [address] [place],  [address]      Lab Results  Component Value Date   ALBUMIN 3.6 06/20/2022   ALBUMIN 2.3 (L) 05/15/2022   ALBUMIN 3.9 08/27/2015    No results found for: "MG" No results found for: "VD25OH"  No results found for: "PREALBUMIN"    Latest Ref Rng & Units 06/20/2022   11:45 AM 05/15/2022    5:14 AM 05/11/2022    4:07 AM  CBC EXTENDED  WBC 4.0 - 10.5 K/uL 6.3  5.6  9.4   RBC 4.22 - 5.81 MIL/uL 3.85  2.55  2.88   Hemoglobin 13.0 - 17.0 g/dL 12.2  8.1  9.3   HCT 39.0 - 52.0 % 36.3  24.2  28.0   Platelets 150 - 400 K/uL 231  218  303   NEUT# 1.7 - 7.7 K/uL 4.2  3.6    Lymph# 0.7 - 4.0 K/uL 1.5  1.3       There is no height or weight on file to calculate BMI.  Orders:  No orders of the defined types were placed in this encounter.  No orders of the defined types were placed in this encounter.    Procedures: No procedures  performed  Clinical Data: No additional findings.  ROS:  All other systems negative, except as noted in the HPI. Review of Systems  Objective: Vital Signs: There were no vitals taken for this visit.  Specialty Comments:  No specialty comments available.  PMFS History: Patient Active Problem List   Diagnosis Date Noted   Elevated coronary artery calcium score 08/06/2022   Dehiscence of amputation stump of right lower extremity ([place]) 06/20/2022   Acute blood loss anemia 05/19/2022   Post-op pain 05/19/2022   Acute renal failure ([place]) 05/19/2022   S/P BKA (below knee amputation), right ([place]) 05/14/2022   Below-knee amputation of right lower extremity ([place]) 05/09/2022   Gangrene of right foot (HCC)    PAD (peripheral artery disease) ([place]) 04/17/2022   Acute osteomyelitis of metatarsal bone of right foot  (HCC)    Atherosclerosis of native arteries of the extremities with ulceration ([place]) 04/15/2022   Peripheral arterial disease ([place]) 03/25/2022   Obesity 03/21/2022   Prediabetes 03/21/2022   Erectile dysfunction 07/26/2021   Family history of ischemic heart disease (IHD) 07/26/2021   Pure hypercholesterolemia 07/26/2021   History of adenomatous polyp of colon 02/03/2019   Internal hemorrhoids 02/03/2019   Carotid artery stenosis, asymptomatic 09/05/2015   Cellulitis    Hypertension    Hyperlipidemia    Occlusion and stenosis of carotid artery without mention of cerebral infarction 01/01/2012   Past Medical History:  Diagnosis Date   Carotid artery occlusion    Cellulitis    Erectile dysfunction    Hyperlipidemia    Hypertension    Peripheral vascular disease ([place])     Family History  Problem Relation Age of Onset   Other Mother        circulatory problems   Heart attack Mother    Deep vein thrombosis Mother    Heart disease Mother    Hyperlipidemia Father    Hypertension Father    Diabetes Father    Deep vein thrombosis Father    Heart disease Father        before age 13   Heart attack Father    Peripheral vascular disease Father    Hypertension Brother    Hyperlipidemia Brother     Past Surgical History:  Procedure Laterality Date   ABDOMINAL AORTOGRAM W/LOWER EXTREMITY N/A 02/04/2022   Procedure: ABDOMINAL AORTOGRAM W/LOWER EXTREMITY;  Surgeon: Serafina Mitchell, MD;  Location: [place] [facility];  Service: Cardiovascular;  Laterality: N/A;   ABDOMINAL AORTOGRAM W/LOWER EXTREMITY N/A 02/18/2022   Procedure: ABDOMINAL AORTOGRAM W/LOWER EXTREMITY;  Surgeon: Serafina Mitchell, MD;  Location: [place] [facility];  Service: Cardiovascular;  Laterality: N/A;   ABDOMINAL AORTOGRAM W/LOWER EXTREMITY N/A 04/15/2022   Procedure: ABDOMINAL AORTOGRAM W/LOWER EXTREMITY;  Surgeon: Serafina Mitchell, MD;  Location: [place] [facility];  Service: Cardiovascular;  Laterality: N/A;    AMPUTATION Right 04/16/2022   Procedure: RIGHT 5TH RAY AMPUTATION;  Surgeon: Newt Minion, MD;  Location: [place];  Service: Orthopedics;  Laterality: Right;   AMPUTATION Right 05/09/2022   Procedure: RIGHT BELOW KNEE AMPUTATION;  Surgeon: Newt Minion, MD;  Location: [place];  Service: Orthopedics;  Laterality: Right;   APPLICATION OF WOUND VAC Right 06/20/2022   Procedure: APPLICATION OF WOUND VAC;  Surgeon: Newt Minion, MD;  Location: [place];  Service: Orthopedics;  Laterality: Right;   COLONOSCOPY     ENDARTERECTOMY Right 09/05/2015   Procedure: ENDARTERECTOMY CAROTID RIGHT;  Surgeon: Elam Dutch, MD;  [facility];  Service: Vascular;  Laterality: Right;   PATCH ANGIOPLASTY  09/05/2015   Procedure: PATCH ANGIOPLASTY RIGHT CAROTID ARTERY USING HEMASHIELD PLATINUM FINESSE PATCH;  Surgeon: Elam Dutch, MD;  Location: [place];  Service: Vascular;;   PERIPHERAL VASCULAR ATHERECTOMY Left 02/18/2022   Procedure: PERIPHERAL VASCULAR ATHERECTOMY;  Surgeon: Serafina Mitchell, MD;  Location: [place] [facility];  Service: Cardiovascular;  Laterality: Left;  Popliteal and Peroneal   PERIPHERAL VASCULAR BALLOON ANGIOPLASTY  02/04/2022   Procedure: PERIPHERAL VASCULAR BALLOON ANGIOPLASTY;  Surgeon: Serafina Mitchell, MD;  Location: [place] [facility];  Service: Cardiovascular;;   PERIPHERAL VASCULAR BALLOON ANGIOPLASTY Right 04/15/2022   Procedure: PERIPHERAL VASCULAR BALLOON ANGIOPLASTY;  Surgeon: Serafina Mitchell, MD;  Location: [place] [facility];  Service: Cardiovascular;  Laterality: Right;  SFA/POPLITEAL   STUMP REVISION Right 06/20/2022   Procedure: REVISION RIGHT BELOW KNEE AMPUTATION;  Surgeon: Newt Minion, MD;  Location: [place];  Service: Orthopedics;  Laterality: Right;   TONSILLECTOMY     VASECTOMY     Social History   Occupational History   Not on file  Tobacco Use   Smoking status: Never   Smokeless tobacco: Former    Types: Chew    Quit date: 01/01/2015  Vaping Use   Vaping  Use: Never used  Substance and Sexual Activity   Alcohol use: Not Currently   Drug use: Not on file   Sexual activity: Not on file

## 2022-09-23 ENCOUNTER — Encounter: Payer: 59 | Admitting: Physical Therapy

## 2022-10-01 ENCOUNTER — Encounter: Payer: Self-pay | Admitting: Orthopedic Surgery

## 2022-10-07 ENCOUNTER — Encounter: Payer: 59 | Admitting: Physical Therapy

## 2022-10-15 ENCOUNTER — Encounter: Payer: 59 | Admitting: Physical Therapy

## 2022-10-15 ENCOUNTER — Encounter: Payer: Self-pay | Admitting: Orthopedic Surgery

## 2022-10-16 ENCOUNTER — Encounter: Payer: 59 | Admitting: Physical Therapy

## 2022-10-21 NOTE — Therapy (Signed)
OUTPATIENT PHYSICAL THERAPY PROSTHETICS EVALUATION   Patient Name: SAMIR BUTH MRN: [id number] DOB:[birth year], 67 y.o., male Today's Date: 10/22/2022  PCP: Lujean Amel, MD REFERRING PROVIDER: Reesa Chew, PA-C  END OF SESSION:  PT End of Session - 10/22/22 0927     Visit Number 1    Number of Visits 20    Date for PT Re-Evaluation 01/15/23    Authorization Type [employer]    Authorization Time Period 60 PT visits, 10% co-insurance    Authorization - Visit Number 1    Authorization - Number of Visits 20    Progress Note Due on Visit 10    PT Start Time 0835    PT Stop Time 0921    PT Time Calculation (min) 46 min    Activity Tolerance Patient tolerated treatment well    Behavior During Therapy [facility] [place] for tasks assessed/performed             Past Medical History:  Diagnosis Date   Carotid artery occlusion    Cellulitis    Erectile dysfunction    Hyperlipidemia    Hypertension    Peripheral vascular disease ([place])    Past Surgical History:  Procedure Laterality Date   ABDOMINAL AORTOGRAM W/LOWER EXTREMITY N/A 02/04/2022   Procedure: ABDOMINAL AORTOGRAM W/LOWER EXTREMITY;  Surgeon: Serafina Mitchell, MD;  Location: [place] [facility];  Service: Cardiovascular;  Laterality: N/A;   ABDOMINAL AORTOGRAM W/LOWER EXTREMITY N/A 02/18/2022   Procedure: ABDOMINAL AORTOGRAM W/LOWER EXTREMITY;  Surgeon: Serafina Mitchell, MD;  Location: [place] [facility];  Service: Cardiovascular;  Laterality: N/A;   ABDOMINAL AORTOGRAM W/LOWER EXTREMITY N/A 04/15/2022   Procedure: ABDOMINAL AORTOGRAM W/LOWER EXTREMITY;  Surgeon: Serafina Mitchell, MD;  Location: [place] [facility];  Service: Cardiovascular;  Laterality: N/A;   AMPUTATION Right 04/16/2022   Procedure: RIGHT 5TH RAY AMPUTATION;  Surgeon: Newt Minion, MD;  Location: [place];  Service: Orthopedics;  Laterality: Right;   AMPUTATION Right 05/09/2022   Procedure: RIGHT BELOW KNEE AMPUTATION;  Surgeon: Newt Minion, MD;  Location: [place];   Service: Orthopedics;  Laterality: Right;   APPLICATION OF WOUND VAC Right 06/20/2022   Procedure: APPLICATION OF WOUND VAC;  Surgeon: Newt Minion, MD;  Location: [place];  Service: Orthopedics;  Laterality: Right;   COLONOSCOPY     ENDARTERECTOMY Right 09/05/2015   Procedure: ENDARTERECTOMY CAROTID RIGHT;  Surgeon: Elam Dutch, MD;  Location: [facility];  Service: Vascular;  Laterality: Right;   PATCH ANGIOPLASTY  09/05/2015   Procedure: PATCH ANGIOPLASTY RIGHT CAROTID ARTERY USING HEMASHIELD PLATINUM FINESSE PATCH;  Surgeon: Elam Dutch, MD;  Location: [place];  Service: Vascular;;   PERIPHERAL VASCULAR ATHERECTOMY Left 02/18/2022   Procedure: PERIPHERAL VASCULAR ATHERECTOMY;  Surgeon: Serafina Mitchell, MD;  Location: [place] [facility];  Service: Cardiovascular;  Laterality: Left;  Popliteal and Peroneal   PERIPHERAL VASCULAR BALLOON ANGIOPLASTY  02/04/2022   Procedure: PERIPHERAL VASCULAR BALLOON ANGIOPLASTY;  Surgeon: Serafina Mitchell, MD;  Location: [place] [facility];  Service: Cardiovascular;;   PERIPHERAL VASCULAR BALLOON ANGIOPLASTY Right 04/15/2022   Procedure: PERIPHERAL VASCULAR BALLOON ANGIOPLASTY;  Surgeon: Serafina Mitchell, MD;  Location: [place] [facility];  Service: Cardiovascular;  Laterality: Right;  SFA/POPLITEAL   STUMP REVISION Right 06/20/2022   Procedure: REVISION RIGHT BELOW KNEE AMPUTATION;  Surgeon: Newt Minion, MD;  Location: [place];  Service: Orthopedics;  Laterality: Right;   TONSILLECTOMY     VASECTOMY  Patient Active Problem List   Diagnosis Date Noted   Elevated coronary artery calcium score 08/06/2022   Dehiscence of amputation stump of right lower extremity ([place]) 06/20/2022   Acute blood loss anemia 05/19/2022   Post-op pain 05/19/2022   Acute renal failure ([place]) 05/19/2022   S/P BKA (below knee amputation), right ([place]) 05/14/2022   Below-knee amputation of right lower extremity ([place]) 05/09/2022   Gangrene of right foot (HCC)    PAD (peripheral  artery disease) ([place]) 04/17/2022   Acute osteomyelitis of metatarsal bone of right foot (HCC)    Atherosclerosis of native arteries of the extremities with ulceration ([place]) 04/15/2022   Peripheral arterial disease ([place]) 03/25/2022   Obesity 03/21/2022   Prediabetes 03/21/2022   Erectile dysfunction 07/26/2021   Family history of ischemic heart disease (IHD) 07/26/2021   Pure hypercholesterolemia 07/26/2021   History of adenomatous polyp of colon 02/03/2019   Internal hemorrhoids 02/03/2019   Carotid artery stenosis, asymptomatic 09/05/2015   Cellulitis    Hypertension    Hyperlipidemia    Occlusion and stenosis of carotid artery without mention of cerebral infarction 01/01/2012    ONSET DATE: 10/15/2022 prosthesis delivery  REFERRING DIAG: [id number] (ICD-10-CM) - Below-knee amputation of right lower extremity   THERAPY DIAG:  Other abnormalities of gait and mobility  Unsteadiness on feet  Non-pressure chronic ulcer of skin of other sites with other specified severity (HCC)  Muscle weakness (generalized)  Rationale for Evaluation and Treatment: Rehabilitation  SUBJECTIVE:   SUBJECTIVE STATEMENT: Patient underwent a right Transtibial Amputation 05/09/22 due to non-healing 5th toe amputation and revision on 06/20/2022. He received prosthesis on 10/16/2022.  He wore prosthesis 1hr 2x on 3/8 and got water blisters.  Stopped wear on 3/9 & 3/10, resumed 3/11 1.5 hrs.   Pt accompanied by: significant other  PERTINENT HISTORY: carotid artery occlusion, HTN, PVD, BLE vascular sugeries  PAIN:  Are you having pain? No  PRECAUTIONS: None  WEIGHT BEARING RESTRICTIONS: No  FALLS: Has patient fallen in last 6 months? No  LIVING ENVIRONMENT: Lives with: lives with their spouse Lives in: House  single level Home Access: Stairs to enter Home layout: One level Stairs: Yes: External: 3 steps; can reach both Has following equipment at home: Single point cane, Walker - 2 wheeled,  Wheelchair (manual), shower chair, and knee scooter  PLOF: Independent, Independent with household mobility without device, and Independent with community mobility without device  circulation issues started in 2019 with worsening 2021,   PATIENT GOALS:   to use prosthesis to travel, golf, climb ladder, go to gym, active in community including coaching,    OBJECTIVE:  COGNITION: Overall cognitive status: Within functional limits for tasks assessed   SENSATION: WFL  POSTURE: flexed trunk  and weight shift left  LOWER EXTREMITY ROM:  ROM P:passive  A:active Right eval Left eval  Hip flexion    Hip extension    Hip abduction    Hip adduction    Hip internal rotation    Hip external rotation    Knee flexion WFL   Knee extension WFL   Ankle dorsiflexion    Ankle plantarflexion    Ankle inversion    Ankle eversion     (Blank rows = not tested)  LOWER EXTREMITY MMT:  MMT Right eval Left eval  Hip flexion 5/5 5/5  Hip extension 4/5   Hip abduction 4/5   Hip adduction    Hip internal rotation    Hip external rotation    Knee  flexion 4/5   Knee extension 4/5   Ankle dorsiflexion    Ankle plantarflexion    Ankle inversion    Ankle eversion    (Blank rows = not tested)  TRANSFERS: Sit to stand: Modified independence Stand to sit: Modified independence  GAIT: Gait pattern: step to pattern, decreased step length- Left, decreased stance time- Right, decreased hip/knee flexion- Right, Right hip hike, knee flexed in stance- Right, antalgic, trunk flexed, and abducted- Right Distance walked: 50' Assistive device utilized: Environmental consultant - 2 wheeled and TTA prosthesis Level of assistance: SBA / cues for deviations no balance issues noted with RW Comments: due to wound issues PT recommending RW for BUE support initially  FUNCTIONAL TESTs:  Eval / 10/22/2022:  Merrilee Jansky Balance Scale: 36/56  [facility] PT Assessment - 10/22/22 0845       Standardized Balance Assessment   Standardized  Balance Assessment Berg Balance Test      Berg Balance Test   Sit to Stand Able to stand  independently using hands    Standing Unsupported Able to stand 2 minutes with supervision    Sitting with Back Unsupported but Feet Supported on Floor or Stool Able to sit safely and securely 2 minutes    Stand to Sit Controls descent by using hands    Transfers Able to transfer safely, minor use of hands    Standing Unsupported with Eyes Closed Able to stand 10 seconds with supervision    Standing Unsupported with Feet Together Able to place feet together independently and stand for 1 minute with supervision    From Standing, Reach Forward with Outstretched Arm Can reach forward >5 cm safely (2")    From Standing Position, Pick up Object from Floor Able to pick up shoe, needs supervision    From Standing Position, Turn to Look Behind Over each Shoulder Looks behind one side only/other side shows less weight shift    Turn 360 Degrees Needs assistance while turning    Standing Unsupported, Alternately Place Feet on Step/Stool Needs assistance to keep from falling or unable to try    Standing Unsupported, One Foot in Front Able to take small step independently and hold 30 seconds    Standing on One Leg Able to lift leg independently and hold 5-10 seconds    Total Score 36    Berg comment: BERG  < 36 high risk for falls (close to 100%) 46-51 moderate (>50%)   37-45 significant (>80%) 52-55 lower (> 25%)             CURRENT PROSTHETIC WEAR ASSESSMENT: Eval / 10/22/2022: Patient is dependent with: skin check, residual limb care, prosthetic cleaning, ply sock cleaning, correct ply sock adjustment, proper wear schedule/adjustment, and proper weight-bearing schedule/adjustment Donning prosthesis: SBA Doffing prosthesis: Modified independence Prosthetic wear tolerance: 1-1.5 hours, 2x/day, for last 2 days.  He wore prosthesis 1hr 2x on 3/8 and got water blisters.  Stopped wear on 3/9 & 3/10, resumed 3/11  1.5 hrs 2x/day for 2 days.  Prosthetic weight bearing tolerance: 5 minutes Edema: pitting Residual limb condition: draining (serous) wound at distal tibia, 2nd open blister on distal limb, minimal hair growth, cylindrical shape, no warmth or abnormal color changes noted.  Prosthetic description: silicon liner with pin lock suspension, total contact socket with flexible inner liner, 0-ply socks, dynamic response foot K code/activity level with prosthetic use: Level 3    TODAY'S TREATMENT:  DATE:  10/22/2022:  PATIENT EDUCATION: PATIENT EDUCATED ON FOLLOWING PROSTHETIC CARE: Education details: PT instructed in use of Vivewear under liner with prosthesis wear. When prosthesis is off, Vivewear & tubular compression shrinker.   Skin check, Residual limb care, Prosthetic cleaning, Correct ply sock adjustment, Propper donning, Proper wear schedule/adjustment, and Proper weight-bearing schedule/adjustment Prosthetic wear tolerance: 1.5 hours 3x/day, 7 days/week with plans to increase every 5-7 days depending on issues. Person educated: Patient and Spouse Education method: Explanation, Demonstration, Tactile cues, and Verbal cues Education comprehension: verbalized understanding, verbal cues required, tactile cues required, and needs further education  HOME EXERCISE PROGRAM:  ASSESSMENT:  CLINICAL IMPRESSION: Patient is a 67 y.o. male who was seen today for physical therapy evaluation and treatment for prosthetic training with right Transtibial Amputation.  Patient is depended in prosthetic care.  Patient has a wound on his residual limb that requires close supervision with progressing prosthesis wear.  Berg balance score of 36/56 indicates high risk of falls and dependency and standing ADLs.  Patient's gait is currently dependent on bilateral upper extremity support of walker.   Patient would benefit from skilled PT to improve functional mobility with his prosthesis.  He appears to have the potential to meet his goals that he noted today.  OBJECTIVE IMPAIRMENTS: Abnormal gait, decreased activity tolerance, decreased balance, decreased knowledge of condition, decreased knowledge of use of DME, decreased mobility, difficulty walking, decreased strength, increased edema, prosthetic dependency , and wound on residual limb .   ACTIVITY LIMITATIONS: carrying, lifting, bending, standing, squatting, stairs, transfers, locomotion level, and prosthesis use  PARTICIPATION LIMITATIONS: meal prep, cleaning, driving, community activity, occupation, yard work, and recreational activities.  PERSONAL FACTORS: Fitness, Time since onset of injury/illness/exacerbation, and 3+ comorbidities: see PMH  are also affecting patient's functional outcome.   REHAB POTENTIAL: Good  CLINICAL DECISION MAKING: Evolving/moderate complexity  EVALUATION COMPLEXITY: Moderate   GOALS: Goals reviewed with patient? Yes  SHORT TERM GOALS: Target date: 11/20/2022  Patient donnes prosthesis modified independent & verbalizes proper cleaning. Baseline: SEE OBJECTIVE DATA Goal status: INITIAL 2.  Patient tolerates prosthesis >10 hrs total /day without skin issues increasing and no limb pain. Baseline: SEE OBJECTIVE DATA Goal status: INITIAL  3.  Berg >/= 45/56. Baseline: SEE OBJECTIVE DATA Goal status: INITIAL  4. Patient ambulates 100' with cane & prosthesis with supervision. Baseline: SEE OBJECTIVE DATA Goal status: INITIAL  5. Patient negotiates ramps & curbs with cane & prosthesis with minA. Baseline: SEE OBJECTIVE DATA Goal status: INITIAL  LONG TERM GOALS: Target date: 01/15/2023  Patient demonstrates & verbalized understanding of prosthetic care to enable safe utilization of prosthesis. Baseline: SEE OBJECTIVE DATA Goal status: INITIAL  Patient tolerates prosthesis wear >90% of awake  hours without skin or limb pain issues. Baseline: SEE OBJECTIVE DATA Goal status: INITIAL  Functional Gait Assessment >/= 19/30 to indicate lower fall risk Baseline: SEE OBJECTIVE DATA Goal status: INITIAL  Patient ambulates >500' with prosthesis only independently Baseline: SEE OBJECTIVE DATA Goal status: INITIAL  Patient negotiates ramps, curbs & stairs with single rail with prosthesis only independently. Baseline: SEE OBJECTIVE DATA Goal status: INITIAL  Patient demonstrates & verbalizes ability to perform yard work & golf with prosthesis only safely. Baseline: SEE OBJECTIVE DATA Goal status: INITIAL   PLAN:  PT FREQUENCY: 1-2x/week  PT DURATION: 12 weeks  PLANNED INTERVENTIONS: Therapeutic exercises, Therapeutic activity, Neuromuscular re-education, Balance training, Gait training, Patient/Family education, Self Care, Stair training, Prosthetic training, Re-evaluation, and physical performance testing.  PLAN FOR NEXT SESSION:  check limb, instruct in HEP for balance    Jamey Reas, PT, DPT 10/22/2022, 4:35 PM

## 2022-10-22 ENCOUNTER — Ambulatory Visit: Payer: 59 | Admitting: Physical Therapy

## 2022-10-22 ENCOUNTER — Other Ambulatory Visit: Payer: Self-pay

## 2022-10-22 ENCOUNTER — Encounter: Payer: Self-pay | Admitting: Physical Therapy

## 2022-10-22 DIAGNOSIS — R2689 Other abnormalities of gait and mobility: Secondary | ICD-10-CM | POA: Diagnosis not present

## 2022-10-22 DIAGNOSIS — M6281 Muscle weakness (generalized): Secondary | ICD-10-CM | POA: Diagnosis not present

## 2022-10-22 DIAGNOSIS — R2681 Unsteadiness on feet: Secondary | ICD-10-CM

## 2022-10-22 DIAGNOSIS — L98498 Non-pressure chronic ulcer of skin of other sites with other specified severity: Secondary | ICD-10-CM | POA: Diagnosis not present

## 2022-10-23 ENCOUNTER — Ambulatory Visit (INDEPENDENT_AMBULATORY_CARE_PROVIDER_SITE_OTHER): Payer: 59 | Admitting: Orthopedic Surgery

## 2022-10-23 ENCOUNTER — Telehealth: Payer: Self-pay | Admitting: *Deleted

## 2022-10-23 ENCOUNTER — Encounter: Payer: Self-pay | Admitting: Orthopedic Surgery

## 2022-10-23 DIAGNOSIS — I739 Peripheral vascular disease, unspecified: Secondary | ICD-10-CM

## 2022-10-23 DIAGNOSIS — S88111A Complete traumatic amputation at level between knee and ankle, right lower leg, initial encounter: Secondary | ICD-10-CM

## 2022-10-23 DIAGNOSIS — Z89511 Acquired absence of right leg below knee: Secondary | ICD-10-CM

## 2022-10-23 NOTE — Telephone Encounter (Signed)
Patient wife called and states Dr Sharol Given told them patient needs to be seen ASAP for possible intervention. His left leg is swollen and he has a place on his toe . I reviewed Dr Jess Barters note and note from Dr Waldron Session and both have noted patient needs ASAP appt with Dr Trula Slade. Patient scheduled to be seen by Dr Trula Slade 10/27/22 at 2:20. No [place] availability I will get with [place] scheduler to see if any availability at another site. Patient would need LLE Arterial and ABI. Patient wife will try to coordinate transportation if we have to schedule [place] on different day than his appt with Dr Trula Slade.

## 2022-10-23 NOTE — Progress Notes (Signed)
Office Visit Note   Patient: Clinton [name]           Date of Birth: 1956/01/19           MRN: [id number] Visit Date: 10/23/2022              Requested by: Lujean Amel, MD [address],  [place] [id number] PCP: Lujean Amel, MD  Chief Complaint  Patient presents with   Left Foot - Pain      HPI: Patient is a 67 year old gentleman who is status post a right transtibial amputation.  He has his new prosthesis made with [employer] at [facility] and has had therapy with Shirlean Mylar is doing well with his right leg.  Patient states he has developed acute ischemic pain in the left foot pain at rest patient states he has still having his leg dependent to decrease the pain.  He is status post  nail resection of the great toe and second toe.  Patient is status post multiple endovascular procedures for both lower extremities.  Assessment & Plan: Visit Diagnoses:  1. Below-knee amputation of right lower extremity ([place])   2. PVD (peripheral vascular disease) ([place])     Plan: Recommended following up with vascular vein surgery,.  Patient is rescheduling his appointment with Dr. Trula Slade.  patient may require repeat vascular intervention for the left lower extremity.  Follow-Up Instructions: Return if symptoms worsen or fail to improve.   Ortho Exam  Patient is alert, oriented, no adenopathy, well-dressed, normal affect, normal respiratory effort. Examination patient has pitting edema of the left lower extremity but no cellulitis from his leg being dependent.  I cannot palpate a dorsalis pedis pulse secondary to swelling.  Patient is on Plavix and aspirin.  There are no gangrenous changes no blisters or ulcers.  Patient has no radicular symptoms, sciatic stretch test is negative.  Imaging: No results found. No images are attached to the encounter.  Labs: Lab Results  Component Value Date   HGBA1C 5.4 04/15/2022   ESRSEDRATE 14 04/08/2022   CRP 2.1 04/08/2022    REPTSTATUS 06/25/2022 FINAL 06/20/2022   GRAMSTAIN  06/20/2022    ABUNDANT WBC PRESENT,BOTH PMN AND MONONUCLEAR NO ORGANISMS SEEN    CULT  06/20/2022    RARE HAEMOPHILUS PARAINFLUENZAE BETA LACTAMASE NEGATIVE FEW GEMELLA MORBILLORUM Standardized susceptibility testing for this organism is not available. Performed at [facility], [place] [address] [place], [place] [address]      Lab Results  Component Value Date   ALBUMIN 3.6 06/20/2022   ALBUMIN 2.3 (L) 05/15/2022   ALBUMIN 3.9 08/27/2015    No results found for: "MG" No results found for: "VD25OH"  No results found for: "PREALBUMIN"    Latest Ref Rng & Units 06/20/2022   11:45 AM 05/15/2022    5:14 AM 05/11/2022    4:07 AM  CBC EXTENDED  WBC 4.0 - 10.5 K/uL 6.3  5.6  9.4   RBC 4.22 - 5.81 MIL/uL 3.85  2.55  2.88   Hemoglobin 13.0 - 17.0 g/dL 12.2  8.1  9.3   HCT 39.0 - 52.0 % 36.3  24.2  28.0   Platelets 150 - 400 K/uL 231  218  303   NEUT# 1.7 - 7.7 K/uL 4.2  3.6    Lymph# 0.7 - 4.0 K/uL 1.5  1.3       There is no height or weight on file to calculate BMI.  Orders:  No orders of  the defined types were placed in this encounter.  No orders of the defined types were placed in this encounter.    Procedures: No procedures performed  Clinical Data: No additional findings.  ROS:  All other systems negative, except as noted in the HPI. Review of Systems  Objective: Vital Signs: There were no vitals taken for this visit.  Specialty Comments:  No specialty comments available.  PMFS History: Patient Active Problem List   Diagnosis Date Noted   Elevated coronary artery calcium score 08/06/2022   Dehiscence of amputation stump of right lower extremity ([place]) 06/20/2022   Acute blood loss anemia 05/19/2022   Post-op pain 05/19/2022   Acute renal failure ([place]) 05/19/2022   S/P BKA (below knee amputation), right ([place]) 05/14/2022   Below-knee amputation of right lower extremity ([place]) 05/09/2022    Gangrene of right foot (H[facility])    PAD (peripheral artery disease) ([place]) 04/17/2022   Acute osteomyelitis of metatarsal bone of right foot ([facility])    Atherosclerosis of native arteries of the extremities with ulceration ([place]) 04/15/2022   Peripheral arterial disease ([place]) 03/25/2022   Obesity 03/21/2022   Prediabetes 03/21/2022   Erectile dysfunction 07/26/2021   Family history of ischemic heart disease (IHD) 07/26/2021   Pure hypercholesterolemia 07/26/2021   History of adenomatous polyp of colon 02/03/2019   Internal hemorrhoids 02/03/2019   Carotid artery stenosis, asymptomatic 09/05/2015   Cellulitis    Hypertension    Hyperlipidemia    Occlusion and stenosis of carotid artery without mention of cerebral infarction 01/01/2012   Past Medical History:  Diagnosis Date   Carotid artery occlusion    Cellulitis    Erectile dysfunction    Hyperlipidemia    Hypertension    Peripheral vascular disease ([place])     Family History  Problem Relation Age of Onset   Other Mother        circulatory problems   Heart attack Mother    Deep vein thrombosis Mother    Heart disease Mother    Hyperlipidemia Father    Hypertension Father    Diabetes Father    Deep vein thrombosis Father    Heart disease Father        before age 62   Heart attack Father    Peripheral vascular disease Father    Hypertension Brother    Hyperlipidemia Brother     Past Surgical History:  Procedure Laterality Date   ABDOMINAL AORTOGRAM W/LOWER EXTREMITY N/A 02/04/2022   Procedure: ABDOMINAL AORTOGRAM W/LOWER EXTREMITY;  Surgeon: Serafina Mitchell, MD;  Location: [facility];  Service: Cardiovascular;  Laterality: N/A;   ABDOMINAL AORTOGRAM W/LOWER EXTREMITY N/A 02/18/2022   Procedure: ABDOMINAL AORTOGRAM W/LOWER EXTREMITY;  Surgeon: Serafina Mitchell, MD;  Location: [facility];  Service: Cardiovascular;  Laterality: N/A;   ABDOMINAL AORTOGRAM W/LOWER EXTREMITY N/A 04/15/2022   Procedure: ABDOMINAL  AORTOGRAM W/LOWER EXTREMITY;  Surgeon: Serafina Mitchell, MD;  Location: [facility];  Service: Cardiovascular;  Laterality: N/A;   AMPUTATION Right 04/16/2022   Procedure: RIGHT 5TH RAY AMPUTATION;  Surgeon: Newt Minion, MD;  Location: [facility];  Service: Orthopedics;  Laterality: Right;   AMPUTATION Right 05/09/2022   Procedure: RIGHT BELOW KNEE AMPUTATION;  Surgeon: Newt Minion, MD;  Location: [facility];  Service: Orthopedics;  Laterality: Right;   APPLICATION OF WOUND VAC Right 06/20/2022   Procedure: APPLICATION OF WOUND VAC;  Surgeon: Newt Minion, MD;  Location: [facility];  Service: Orthopedics;  Laterality: Right;   COLONOSCOPY     ENDARTERECTOMY Right 09/05/2015   Procedure: ENDARTERECTOMY CAROTID RIGHT;  Surgeon: Elam Dutch, MD;  Location: [facility];  Service: Vascular;  Laterality: Right;   PATCH ANGIOPLASTY  09/05/2015   Procedure: PATCH ANGIOPLASTY RIGHT CAROTID ARTERY USING HEMASHIELD PLATINUM FINESSE PATCH;  Surgeon: Elam Dutch, MD;  Location: [facility];  Service: Vascular;;   PERIPHERAL VASCULAR ATHERECTOMY Left 02/18/2022   Procedure: PERIPHERAL VASCULAR ATHERECTOMY;  Surgeon: Serafina Mitchell, MD;  Location: [facility];  Service: Cardiovascular;  Laterality: Left;  Popliteal and Peroneal   PERIPHERAL VASCULAR BALLOON ANGIOPLASTY  02/04/2022   Procedure: PERIPHERAL VASCULAR BALLOON ANGIOPLASTY;  Surgeon: Serafina Mitchell, MD;  Location: [facility];  Service: Cardiovascular;;   PERIPHERAL VASCULAR BALLOON ANGIOPLASTY Right 04/15/2022   Procedure: PERIPHERAL VASCULAR BALLOON ANGIOPLASTY;  Surgeon: Serafina Mitchell, MD;  Location: [facility];  Service: Cardiovascular;  Laterality: Right;  SFA/POPLITEAL   STUMP REVISION Right 06/20/2022   Procedure: REVISION RIGHT BELOW KNEE AMPUTATION;  Surgeon: Newt Minion, MD;  Location: [facility];  Service: Orthopedics;  Laterality: Right;   TONSILLECTOMY     VASECTOMY     Social History   Occupational History   Not on  file  Tobacco Use   Smoking status: Never   Smokeless tobacco: Former    Types: Chew    Quit date: 01/01/2015  Vaping Use   Vaping Use: Never used  Substance and Sexual Activity   Alcohol use: Not Currently   Drug use: Not on file   Sexual activity: Not on file

## 2022-10-23 NOTE — Telephone Encounter (Signed)
Patients wife called back and is unable to arrange transportation for patient to get an [place] tomorrow they will keep the appt with Dr Trula Slade on Monday and see if we can work patient in for an ABI at that time.

## 2022-10-27 ENCOUNTER — Ambulatory Visit (INDEPENDENT_AMBULATORY_CARE_PROVIDER_SITE_OTHER): Payer: 59 | Admitting: Surgery

## 2022-10-27 ENCOUNTER — Other Ambulatory Visit: Payer: Self-pay

## 2022-10-27 ENCOUNTER — Encounter: Payer: Self-pay | Admitting: Physical Therapy

## 2022-10-27 ENCOUNTER — Encounter: Payer: Self-pay | Admitting: Surgery

## 2022-10-27 ENCOUNTER — Ambulatory Visit (HOSPITAL_COMMUNITY)
Admission: RE | Admit: 2022-10-27 | Discharge: 2022-10-27 | Disposition: A | Discharge status: Home / Self Care | Payer: 59 | Source: Ambulatory Visit | Attending: Surgery | Admitting: Surgery

## 2022-10-27 ENCOUNTER — Ambulatory Visit (INDEPENDENT_AMBULATORY_CARE_PROVIDER_SITE_OTHER): Payer: 59 | Admitting: Physical Therapy

## 2022-10-27 VITALS — BP 143/78 | HR 74 | Temp 98.4°F | Resp 20 | Ht 70.0 in | Wt 199.8 lb

## 2022-10-27 DIAGNOSIS — R2689 Other abnormalities of gait and mobility: Secondary | ICD-10-CM | POA: Diagnosis not present

## 2022-10-27 DIAGNOSIS — R2681 Unsteadiness on feet: Secondary | ICD-10-CM

## 2022-10-27 DIAGNOSIS — I7025 Atherosclerosis of native arteries of other extremities with ulceration: Secondary | ICD-10-CM

## 2022-10-27 DIAGNOSIS — L98498 Non-pressure chronic ulcer of skin of other sites with other specified severity: Secondary | ICD-10-CM | POA: Diagnosis not present

## 2022-10-27 LAB — VAS US ABI WITH/WO TBI: Left ABI: 0.52

## 2022-10-27 NOTE — Progress Notes (Signed)
[facility]  Patient name: Clinton [name] MRN: [id number] DOB: 07-Dec-1955 Sex: male   REASON FOR VISIT:    Follow up  HISOTRY OF PRESENT ILLNESS:    Clinton [name] is a 67 y.o. male who has undergone the following procedures:   09/05/2015: Right carotid endarterectomy (asymptomatic, Dr. Oneida Alar) 02/04/2022: DCB, right popliteal artery, PTA right anterior tibial artery (ulcers, Dr. Trula Slade) 02/18/2022: DCB, left popliteal artery, PTA, left peroneal artery, atherectomy, left popliteal and peroneal artery (ulcers, Nicholette Dolson) 04/15/2022: Angioplasty, right popliteal artery, angioplasty, right anterior tibial artery (ulcer, Lyanna Blystone) 04/16/2022: Right fifth toe amputation Sharol Given) 05/09/2022: Right below-knee amputation Sharol Given) 06/20/2022: Revision right below-knee amputation Sharol Given)   He has been working with his new prosthesis and doing well he actually walked into my office using only a cane.  He has had issues with his left great toe since January.  He has been on antibiotics.  He has had a nail removed.  He has also had episodes at night where he has persistent pain   Past Medical History:  Diagnosis Date   Carotid artery occlusion    Cellulitis    Erectile dysfunction    Hyperlipidemia    Hypertension    Peripheral vascular disease ([place])      FAMILY HISTORY:   Family History  Problem Relation Age of Onset   Other Mother        circulatory problems   Heart attack Mother    Deep vein thrombosis Mother    Heart disease Mother    Hyperlipidemia Father    Hypertension Father    Diabetes Father    Deep vein thrombosis Father    Heart disease Father        before age 61   Heart attack Father    Peripheral vascular disease Father    Hypertension Brother    Hyperlipidemia Brother     SOCIAL HISTORY:   Social History   Tobacco Use   Smoking status: Never   Smokeless tobacco: Former    Types: Chew     Quit date: 01/01/2015  Substance Use Topics   Alcohol use: Not Currently     ALLERGIES:   Allergies  Allergen Reactions   Penicillins Rash    Has patient had a PCN reaction causing immediate rash, facial/tongue/throat swelling, SOB or lightheadedness with hypotension: Yes Has patient had a PCN reaction causing severe rash involving mucus membranes or skin necrosis: No Has patient had a PCN reaction that required hospitalization No Has patient had a PCN reaction occurring within the last 10 years: No If all of the above answers are "NO", then may proceed with Cephalosporin use.  Tolerated Cefazolin on 04/16/22    Sulfa Antibiotics Swelling and Other (See Comments)    Facial/lip swelling     CURRENT MEDICATIONS:   Current Outpatient Medications  Medication Sig Dispense Refill   amLODipine (NORVASC) 2.5 MG tablet Take 2.5 mg by mouth at bedtime.     ascorbic acid (VITAMIN C) 1000 MG tablet Take 1 tablet (1,000 mg total) by mouth daily.     aspirin EC 81 MG tablet Take 81 mg by mouth at bedtime.     atorvastatin (LIPITOR) 80 MG tablet Take 80 mg by mouth at bedtime.     clopidogrel (PLAVIX) 75 MG tablet Take 1 tablet (75 mg total) by mouth daily. (Patient taking differently: Take 75 mg by mouth at bedtime.) 30 tablet 11   irbesartan-hydrochlorothiazide (AVALIDE) 300-12.5 MG tablet Take  1 tablet by mouth at bedtime.     Multiple Vitamin (MULTIVITAMIN) capsule Take 2 capsules by mouth at bedtime. Gummy     Omega-3 Fatty Acids (OMEGA 3 PO) Take 1 capsule by mouth daily.     Probiotic Product (PROBIOTIC PO) Take 1 capsule by mouth daily.     zinc sulfate 220 (50 Zn) MG capsule Take 1 capsule (220 mg total) by mouth daily.     No current facility-administered medications for this visit.    REVIEW OF SYSTEMS:   [X]  denotes positive finding, [ ]  denotes negative finding Cardiac  Comments:  Chest pain or chest pressure:    Shortness of breath upon exertion:    Short of breath when  lying flat:    Irregular heart rhythm:        Vascular    Pain in calf, thigh, or hip brought on by ambulation:    Pain in feet at night that wakes you up from your sleep:  x   Blood clot in your veins:    Leg swelling:         Pulmonary    Oxygen at home:    Productive cough:     Wheezing:         Neurologic    Sudden weakness in arms or legs:     Sudden numbness in arms or legs:     Sudden onset of difficulty speaking or slurred speech:    Temporary loss of vision in one eye:     Problems with dizziness:         Gastrointestinal    Blood in stool:     Vomited blood:         Genitourinary    Burning when urinating:     Blood in urine:        Psychiatric    Major depression:         Hematologic    Bleeding problems:    Problems with blood clotting too easily:        Skin    Rashes or ulcers:        Constitutional    Fever or chills:      PHYSICAL EXAM:   Vitals:   10/27/22 1421  BP: (!) 143/78  Pulse: 74  Resp: 20  Temp: 98.4 F (36.9 C)  SpO2: 95%  Weight: 199 lb 12.8 oz (90.6 kg)  Height: 5\' 10"  (1.778 m)    GENERAL: The patient is a well-nourished male, in no acute distress. The vital signs are documented above. CARDIAC: There is a regular rate and rhythm.  VASCULAR: Non-palpable left pedal pulse. PULMONARY: Non-labored respirations ABDOMEN: Soft and non-tender with normal pitched bowel sounds.  MUSCULOSKELETAL: Erythema to the left great toe extending up onto the dorsum of the foot with edema NEUROLOGIC: No focal weakness or paresthesias are detected. SKIN: There are no ulcers or rashes noted. PSYCHIATRIC: The patient has a normal affect.  STUDIES:   I have reviewed the following studies:  +-------+-----------+-----------+------------+------------+  ABI/TBIToday's ABIToday's TBIPrevious ABIPrevious TBI  +-------+-----------+-----------+------------+------------+  Right BKA                   BKA                        +-------+-----------+-----------+------------+------------+  Left  0.52       0.1        [place]  0.49          +-------+-----------+-----------+------------+------------+   Left toe pressure: 14 MEDICAL ISSUES:   Left lower extremity atherosclerotic vascular disease with ulceration: The patient has previously undergone intervention on the left leg.  His ABIs are 0.52 today.  His toe pressure is 14.  I think he needs urgent angiography to evaluate his blood flow and intervene for limb salvage.  This will be via a right femoral approach.  I am adding him onto the schedule for tomorrow.    Leia Alf, MD, FACS [facility] [phone] Pager [phone]

## 2022-10-27 NOTE — H&P (View-Only) (Signed)
[facility] [place]  Patient name: Clinton [name] MRN: [id number] DOB: 07-Dec-1955 Sex: male   REASON FOR VISIT:    Follow up  HISOTRY OF PRESENT ILLNESS:    Clinton [name] is a 67 y.o. male who has undergone the following procedures:   09/05/2015: Right carotid endarterectomy (asymptomatic, Dr. Oneida Alar) 02/04/2022: DCB, right popliteal artery, PTA right anterior tibial artery (ulcers, Dr. Trula Slade) 02/18/2022: DCB, left popliteal artery, PTA, left peroneal artery, atherectomy, left popliteal and peroneal artery (ulcers, Messiah Ahr) 04/15/2022: Angioplasty, right popliteal artery, angioplasty, right anterior tibial artery (ulcer, Chany Woolworth) 04/16/2022: Right fifth toe amputation Sharol Given) 05/09/2022: Right below-knee amputation Sharol Given) 06/20/2022: Revision right below-knee amputation Sharol Given)   He has been working with his new prosthesis and doing well he actually walked into my office using only a cane.  He has had issues with his left great toe since January.  He has been on antibiotics.  He has had a nail removed.  He has also had episodes at night where he has persistent pain   Past Medical History:  Diagnosis Date   Carotid artery occlusion    Cellulitis    Erectile dysfunction    Hyperlipidemia    Hypertension    Peripheral vascular disease ([place])      FAMILY HISTORY:   Family History  Problem Relation Age of Onset   Other Mother        circulatory problems   Heart attack Mother    Deep vein thrombosis Mother    Heart disease Mother    Hyperlipidemia Father    Hypertension Father    Diabetes Father    Deep vein thrombosis Father    Heart disease Father        before age 61   Heart attack Father    Peripheral vascular disease Father    Hypertension Brother    Hyperlipidemia Brother     SOCIAL HISTORY:   Social History   Tobacco Use   Smoking status: Never   Smokeless tobacco: Former    Types: Chew     Quit date: 01/01/2015  Substance Use Topics   Alcohol use: Not Currently     ALLERGIES:   Allergies  Allergen Reactions   Penicillins Rash    Has patient had a PCN reaction causing immediate rash, facial/tongue/throat swelling, SOB or lightheadedness with hypotension: Yes Has patient had a PCN reaction causing severe rash involving mucus membranes or skin necrosis: No Has patient had a PCN reaction that required hospitalization No Has patient had a PCN reaction occurring within the last 10 years: No If all of the above answers are "NO", then may proceed with Cephalosporin use.  Tolerated Cefazolin on 04/16/22    Sulfa Antibiotics Swelling and Other (See Comments)    Facial/lip swelling     CURRENT MEDICATIONS:   Current Outpatient Medications  Medication Sig Dispense Refill   amLODipine (NORVASC) 2.5 MG tablet Take 2.5 mg by mouth at bedtime.     ascorbic acid (VITAMIN C) 1000 MG tablet Take 1 tablet (1,000 mg total) by mouth daily.     aspirin EC 81 MG tablet Take 81 mg by mouth at bedtime.     atorvastatin (LIPITOR) 80 MG tablet Take 80 mg by mouth at bedtime.     clopidogrel (PLAVIX) 75 MG tablet Take 1 tablet (75 mg total) by mouth daily. (Patient taking differently: Take 75 mg by mouth at bedtime.) 30 tablet 11   irbesartan-hydrochlorothiazide (AVALIDE) 300-12.5 MG tablet Take  1 tablet by mouth at bedtime.     Multiple Vitamin (MULTIVITAMIN) capsule Take 2 capsules by mouth at bedtime. Gummy     Omega-3 Fatty Acids (OMEGA 3 PO) Take 1 capsule by mouth daily.     Probiotic Product (PROBIOTIC PO) Take 1 capsule by mouth daily.     zinc sulfate 220 (50 Zn) MG capsule Take 1 capsule (220 mg total) by mouth daily.     No current facility-administered medications for this visit.    REVIEW OF SYSTEMS:   [X]  denotes positive finding, [ ]  denotes negative finding Cardiac  Comments:  Chest pain or chest pressure:    Shortness of breath upon exertion:    Short of breath when  lying flat:    Irregular heart rhythm:        Vascular    Pain in calf, thigh, or hip brought on by ambulation:    Pain in feet at night that wakes you up from your sleep:  x   Blood clot in your veins:    Leg swelling:         Pulmonary    Oxygen at home:    Productive cough:     Wheezing:         Neurologic    Sudden weakness in arms or legs:     Sudden numbness in arms or legs:     Sudden onset of difficulty speaking or slurred speech:    Temporary loss of vision in one eye:     Problems with dizziness:         Gastrointestinal    Blood in stool:     Vomited blood:         Genitourinary    Burning when urinating:     Blood in urine:        Psychiatric    Major depression:         Hematologic    Bleeding problems:    Problems with blood clotting too easily:        Skin    Rashes or ulcers:        Constitutional    Fever or chills:      PHYSICAL EXAM:   Vitals:   10/27/22 1421  BP: (!) 143/78  Pulse: 74  Resp: 20  Temp: 98.4 F (36.9 C)  SpO2: 95%  Weight: 199 lb 12.8 oz (90.6 kg)  Height: 5\' 10"  (1.778 m)    GENERAL: The patient is a well-nourished male, in no acute distress. The vital signs are documented above. CARDIAC: There is a regular rate and rhythm.  VASCULAR: Non-palpable left pedal pulse. PULMONARY: Non-labored respirations ABDOMEN: Soft and non-tender with normal pitched bowel sounds.  MUSCULOSKELETAL: Erythema to the left great toe extending up onto the dorsum of the foot with edema NEUROLOGIC: No focal weakness or paresthesias are detected. SKIN: There are no ulcers or rashes noted. PSYCHIATRIC: The patient has a normal affect.  STUDIES:   I have reviewed the following studies:  +-------+-----------+-----------+------------+------------+  ABI/TBIToday's ABIToday's TBIPrevious ABIPrevious TBI  +-------+-----------+-----------+------------+------------+  Right BKA                   BKA                        +-------+-----------+-----------+------------+------------+  Left  0.52       0.1        [place]  0.49          +-------+-----------+-----------+------------+------------+   Left toe pressure: 14 MEDICAL ISSUES:   Left lower extremity atherosclerotic vascular disease with ulceration: The patient has previously undergone intervention on the left leg.  His ABIs are 0.52 today.  His toe pressure is 14.  I think he needs urgent angiography to evaluate his blood flow and intervene for limb salvage.  This will be via a right femoral approach.  I am adding him onto the schedule for tomorrow.    Leia Alf, MD, FACS [facility] [phone] Pager [phone]

## 2022-10-27 NOTE — Therapy (Signed)
OUTPATIENT PHYSICAL THERAPY TREATMENT   Patient Name: Clinton [name] MRN: [id number] DOB:[birth year], 67 y.o., male Today's Date: 10/27/2022  PCP: Lujean Amel, MD REFERRING PROVIDER: Reesa Chew, PA-C  END OF SESSION:  PT End of Session - 10/27/22 0845     Visit Number 2    Number of Visits 20    Date for PT Re-Evaluation 01/15/23    Authorization Type [employer]    Authorization Time Period 56 PT visits, 10% co-insurance    Authorization - Visit Number 2    Authorization - Number of Visits 20    Progress Note Due on Visit 10    PT Start Time 0845    PT Stop Time 0931    PT Time Calculation (min) 46 min    Activity Tolerance Patient tolerated treatment well    Behavior During Therapy [facility] [address] for tasks assessed/performed              Past Medical History:  Diagnosis Date   Carotid artery occlusion    Cellulitis    Erectile dysfunction    Hyperlipidemia    Hypertension    Peripheral vascular disease ([place])    Past Surgical History:  Procedure Laterality Date   ABDOMINAL AORTOGRAM W/LOWER EXTREMITY N/A 02/04/2022   Procedure: ABDOMINAL AORTOGRAM W/LOWER EXTREMITY;  Surgeon: Serafina Mitchell, MD;  Location: [facility];  Service: Cardiovascular;  Laterality: N/A;   ABDOMINAL AORTOGRAM W/LOWER EXTREMITY N/A 02/18/2022   Procedure: ABDOMINAL AORTOGRAM W/LOWER EXTREMITY;  Surgeon: Serafina Mitchell, MD;  Location: [facility];  Service: Cardiovascular;  Laterality: N/A;   ABDOMINAL AORTOGRAM W/LOWER EXTREMITY N/A 04/15/2022   Procedure: ABDOMINAL AORTOGRAM W/LOWER EXTREMITY;  Surgeon: Serafina Mitchell, MD;  Location: [facility];  Service: Cardiovascular;  Laterality: N/A;   AMPUTATION Right 04/16/2022   Procedure: RIGHT 5TH RAY AMPUTATION;  Surgeon: Newt Minion, MD;  Location: [place];  Service: Orthopedics;  Laterality: Right;   AMPUTATION Right 05/09/2022   Procedure: RIGHT BELOW KNEE AMPUTATION;  Surgeon: Newt Minion, MD;  Location: [facility];  Service:  Orthopedics;  Laterality: Right;   APPLICATION OF WOUND VAC Right 06/20/2022   Procedure: APPLICATION OF WOUND VAC;  Surgeon: Newt Minion, MD;  Location: [facility];  Service: Orthopedics;  Laterality: Right;   COLONOSCOPY     ENDARTERECTOMY Right 09/05/2015   Procedure: ENDARTERECTOMY CAROTID RIGHT;  Surgeon: Elam Dutch, MD;  Location: [facility];  Service: Vascular;  Laterality: Right;   PATCH ANGIOPLASTY  09/05/2015   Procedure: PATCH ANGIOPLASTY RIGHT CAROTID ARTERY USING HEMASHIELD PLATINUM FINESSE PATCH;  Surgeon: Elam Dutch, MD;  Location: [facility];  Service: Vascular;;   PERIPHERAL VASCULAR ATHERECTOMY Left 02/18/2022   Procedure: PERIPHERAL VASCULAR ATHERECTOMY;  Surgeon: Serafina Mitchell, MD;  Location: [facility];  Service: Cardiovascular;  Laterality: Left;  Popliteal and Peroneal   PERIPHERAL VASCULAR BALLOON ANGIOPLASTY  02/04/2022   Procedure: PERIPHERAL VASCULAR BALLOON ANGIOPLASTY;  Surgeon: Serafina Mitchell, MD;  Location: [facility];  Service: Cardiovascular;;   PERIPHERAL VASCULAR BALLOON ANGIOPLASTY Right 04/15/2022   Procedure: PERIPHERAL VASCULAR BALLOON ANGIOPLASTY;  Surgeon: Serafina Mitchell, MD;  Location: [facility];  Service: Cardiovascular;  Laterality: Right;  SFA/POPLITEAL   STUMP REVISION Right 06/20/2022   Procedure: REVISION RIGHT BELOW KNEE AMPUTATION;  Surgeon: Newt Minion, MD;  Location: [facility];  Service: Orthopedics;  Laterality: Right;   TONSILLECTOMY     VASECTOMY  Patient Active Problem List   Diagnosis Date Noted   Elevated coronary artery calcium score 08/06/2022   Dehiscence of amputation stump of right lower extremity ([place]) 06/20/2022   Acute blood loss anemia 05/19/2022   Post-op pain 05/19/2022   Acute renal failure ([place]) 05/19/2022   S/P BKA (below knee amputation), right ([place]) 05/14/2022   Below-knee amputation of right lower extremity ([place]) 05/09/2022   Gangrene of right foot ([facility])    PAD (peripheral artery  disease) ([place]) 04/17/2022   Acute osteomyelitis of metatarsal bone of right foot ([facility])    Atherosclerosis of native arteries of the extremities with ulceration ([place]) 04/15/2022   Peripheral arterial disease ([place]) 03/25/2022   Obesity 03/21/2022   Prediabetes 03/21/2022   Erectile dysfunction 07/26/2021   Family history of ischemic heart disease (IHD) 07/26/2021   Pure hypercholesterolemia 07/26/2021   History of adenomatous polyp of colon 02/03/2019   Internal hemorrhoids 02/03/2019   Carotid artery stenosis, asymptomatic 09/05/2015   Cellulitis    Hypertension    Hyperlipidemia    Occlusion and stenosis of carotid artery without mention of cerebral infarction 01/01/2012    ONSET DATE: 10/15/2022 prosthesis delivery  REFERRING DIAG: [id number] (ICD-10-CM) - Below-knee amputation of right lower extremity   THERAPY DIAG:  Other abnormalities of gait and mobility  Unsteadiness on feet  Non-pressure chronic ulcer of skin of other sites with other specified severity ([place])  Rationale for Evaluation and Treatment: Rehabilitation  SUBJECTIVE:   SUBJECTIVE STATEMENT:   He had LLE 1st & 2nd toenail removed.  He is seeing [facility] today.  He is wearing prosthesis 2.5-4 hours 2-3x/day.   PERTINENT HISTORY: carotid artery occlusion, HTN, PVD, BLE vascular sugeries  PAIN:  Are you having pain? No  PRECAUTIONS: None  WEIGHT BEARING RESTRICTIONS: No  FALLS: Has patient fallen in last 6 months? No  LIVING ENVIRONMENT: Lives with: lives with their spouse Lives in: House  single level Home Access: Stairs to enter Home layout: One level Stairs: Yes: External: 3 steps; can reach both Has following equipment at home: Single point cane, Walker - 2 wheeled, Wheelchair (manual), shower chair, and knee scooter  PLOF: Independent, Independent with household mobility without device, and Independent with community mobility without device  circulation issues started in 2019 with worsening 2021,    PATIENT GOALS:   to use prosthesis to travel, golf, climb ladder, go to gym, active in community including coaching,    OBJECTIVE:  COGNITION: Overall cognitive status: Within functional limits for tasks assessed   SENSATION: WFL  POSTURE: flexed trunk  and weight shift left  LOWER EXTREMITY ROM:  ROM P:passive  A:active Right eval Left eval  Hip flexion    Hip extension    Hip abduction    Hip adduction    Hip internal rotation    Hip external rotation    Knee flexion WFL   Knee extension WFL   Ankle dorsiflexion    Ankle plantarflexion    Ankle inversion    Ankle eversion     (Blank rows = not tested)  LOWER EXTREMITY MMT:  MMT Right eval Left eval  Hip flexion 5/5 5/5  Hip extension 4/5   Hip abduction 4/5   Hip adduction    Hip internal rotation    Hip external rotation    Knee flexion 4/5   Knee extension 4/5   Ankle dorsiflexion    Ankle plantarflexion    Ankle inversion    Ankle eversion    (Blank rows =  not tested)  TRANSFERS: Sit to stand: Modified independence Stand to sit: Modified independence  GAIT: Gait pattern: step to pattern, decreased step length- Left, decreased stance time- Right, decreased hip/knee flexion- Right, Right hip hike, knee flexed in stance- Right, antalgic, trunk flexed, and abducted- Right Distance walked: 50' Assistive device utilized: Environmental consultant - 2 wheeled and TTA prosthesis Level of assistance: SBA / cues for deviations no balance issues noted with RW Comments: due to wound issues PT recommending RW for BUE support initially  FUNCTIONAL TESTs:  Eval / 10/22/2022:  Merrilee Jansky Balance Scale: 36/56    CURRENT PROSTHETIC WEAR ASSESSMENT: Eval / 10/22/2022: Patient is dependent with: skin check, residual limb care, prosthetic cleaning, ply sock cleaning, correct ply sock adjustment, proper wear schedule/adjustment, and proper weight-bearing schedule/adjustment Donning prosthesis: SBA Doffing prosthesis: Modified  independence Prosthetic wear tolerance: 1-1.5 hours, 2x/day, for last 2 days.  He wore prosthesis 1hr 2x on 3/8 and got water blisters.  Stopped wear on 3/9 & 3/10, resumed 3/11 1.5 hrs 2x/day for 2 days.  Prosthetic weight bearing tolerance: 5 minutes Edema: pitting Residual limb condition: draining (serous) wound at distal tibia, 2nd open blister on distal limb, minimal hair growth, cylindrical shape, no warmth or abnormal color changes noted.  Prosthetic description: silicon liner with pin lock suspension, total contact socket with flexible inner liner, 0-ply socks, dynamic response foot K code/activity level with prosthetic use: Level 3    TODAY'S TREATMENT:                                                                                                                             DATE:  10/27/2022 Prosthetic Training with TTA prosthesis: Pt arrived using single point cane. PT adjusted height & ensuring lock ring tight. Pt amb 100' X 2 with supervision.  Wound at distal tibia with invagination, serous drainage & scab present with no signs of infection.  PT recommending wear up to 4 hours 3x/day with check underliner every 2 hours for dampness with switching.  Needs to remove prosthesis & liner using shrinker for 2 hours between wear to allow skin / wound to dry. Pt & significant other verbalized understanding.  PT instructed in adjusting ply socks with too few, too many & correct ply fit.  Pt verbalized better understanding.   10/22/2022:  PATIENT EDUCATION: PATIENT EDUCATED ON FOLLOWING PROSTHETIC CARE: Education details: PT instructed in use of Vivewear under liner with prosthesis wear. When prosthesis is off, Vivewear & tubular compression shrinker.   Skin check, Residual limb care, Prosthetic cleaning, Correct ply sock adjustment, Propper donning, Proper wear schedule/adjustment, and Proper weight-bearing schedule/adjustment Prosthetic wear tolerance: 1.5 hours 3x/day, 7 days/week with  plans to increase every 5-7 days depending on issues. Person educated: Patient and Spouse Education method: Explanation, Demonstration, Tactile cues, and Verbal cues Education comprehension: verbalized understanding, verbal cues required, tactile cues required, and needs further education  HOME EXERCISE PROGRAM:  ASSESSMENT:  CLINICAL IMPRESSION: Patient appears to  have better understanding how to adjust ply socks. He is tolerating longer wear & weight bearing with cane.  Patient continues to benefit from skilled PT.   OBJECTIVE IMPAIRMENTS: Abnormal gait, decreased activity tolerance, decreased balance, decreased knowledge of condition, decreased knowledge of use of DME, decreased mobility, difficulty walking, decreased strength, increased edema, prosthetic dependency , and wound on residual limb .   ACTIVITY LIMITATIONS: carrying, lifting, bending, standing, squatting, stairs, transfers, locomotion level, and prosthesis use  PARTICIPATION LIMITATIONS: meal prep, cleaning, driving, community activity, occupation, yard work, and recreational activities.  PERSONAL FACTORS: Fitness, Time since onset of injury/illness/exacerbation, and 3+ comorbidities: see PMH  are also affecting patient's functional outcome.   REHAB POTENTIAL: Good  CLINICAL DECISION MAKING: Evolving/moderate complexity  EVALUATION COMPLEXITY: Moderate   GOALS: Goals reviewed with patient? Yes  SHORT TERM GOALS: Target date: 11/20/2022  Patient donnes prosthesis modified independent & verbalizes proper cleaning. Baseline: SEE OBJECTIVE DATA Goal status: Ongoing 10/27/2022 2.  Patient tolerates prosthesis >10 hrs total /day without skin issues increasing and no limb pain. Baseline: SEE OBJECTIVE DATA Goal status: Ongoing 10/27/2022  3.  Berg >/= 45/56. Baseline: SEE OBJECTIVE DATA Goal status: Ongoing 10/27/2022  4. Patient ambulates 100' with cane & prosthesis with supervision. Baseline: SEE OBJECTIVE  DATA Goal status: Ongoing 10/27/2022  5. Patient negotiates ramps & curbs with cane & prosthesis with minA. Baseline: SEE OBJECTIVE DATA Goal status: Ongoing 10/27/2022  LONG TERM GOALS: Target date: 01/15/2023  Patient demonstrates & verbalized understanding of prosthetic care to enable safe utilization of prosthesis. Baseline: SEE OBJECTIVE DATA Goal status: Ongoing 10/27/2022  Patient tolerates prosthesis wear >90% of awake hours without skin or limb pain issues. Baseline: SEE OBJECTIVE DATA Goal status: Ongoing 10/27/2022  Functional Gait Assessment >/= 19/30 to indicate lower fall risk Baseline: SEE OBJECTIVE DATA Goal status: Ongoing 10/27/2022  Patient ambulates >500' with prosthesis only independently Baseline: SEE OBJECTIVE DATA Goal status: Ongoing 10/27/2022  Patient negotiates ramps, curbs & stairs with single rail with prosthesis only independently. Baseline: SEE OBJECTIVE DATA Goal status: Ongoing 10/27/2022  Patient demonstrates & verbalizes ability to perform yard work & golf with prosthesis only safely. Baseline: SEE OBJECTIVE DATA Goal status: Ongoing 10/27/2022   PLAN:  PT FREQUENCY: 1-2x/week  PT DURATION: 12 weeks  PLANNED INTERVENTIONS: Therapeutic exercises, Therapeutic activity, Neuromuscular re-education, Balance training, Gait training, Patient/Family education, Self Care, Stair training, Prosthetic training, Re-evaluation, and physical performance testing.  PLAN FOR NEXT SESSION:  check updated wear is going, instruct in HEP for balance    Jamey Reas, PT, DPT 10/27/2022, 12:58 PM

## 2022-10-28 ENCOUNTER — Ambulatory Visit (HOSPITAL_COMMUNITY)
Admission: RE | Admit: 2022-10-28 | Discharge: 2022-10-28 | Disposition: A | Discharge status: Home / Self Care | Payer: 59 | Attending: Surgery | Admitting: Surgery

## 2022-10-28 ENCOUNTER — Encounter (HOSPITAL_COMMUNITY)
Admission: RE | Disposition: A | Discharge status: Home / Self Care | Payer: Self-pay | Source: Home / Self Care | Attending: Surgery

## 2022-10-28 ENCOUNTER — Other Ambulatory Visit: Payer: Self-pay

## 2022-10-28 DIAGNOSIS — I70249 Atherosclerosis of native arteries of left leg with ulceration of unspecified site: Secondary | ICD-10-CM | POA: Diagnosis present

## 2022-10-28 DIAGNOSIS — I70245 Atherosclerosis of native arteries of left leg with ulceration of other part of foot: Secondary | ICD-10-CM | POA: Diagnosis not present

## 2022-10-28 DIAGNOSIS — Z87891 Personal history of nicotine dependence: Secondary | ICD-10-CM | POA: Diagnosis not present

## 2022-10-28 DIAGNOSIS — L97929 Non-pressure chronic ulcer of unspecified part of left lower leg with unspecified severity: Secondary | ICD-10-CM | POA: Diagnosis not present

## 2022-10-28 DIAGNOSIS — I7025 Atherosclerosis of native arteries of other extremities with ulceration: Secondary | ICD-10-CM

## 2022-10-28 HISTORY — PX: ABDOMINAL AORTOGRAM W/LOWER EXTREMITY: CATH118223

## 2022-10-28 HISTORY — PX: PERIPHERAL VASCULAR BALLOON ANGIOPLASTY: CATH118281

## 2022-10-28 HISTORY — PX: INTRAVASCULAR LITHOTRIPSY: CATH118324

## 2022-10-28 LAB — POCT I-STAT, CHEM 8
BUN: 14 mg/dL (ref 8–23)
Calcium, Ion: 1.25 mmol/L (ref 1.15–1.40)
Chloride: 102 mmol/L (ref 98–111)
Creatinine, Ser: 1 mg/dL (ref 0.61–1.24)
Glucose, Bld: 100 mg/dL — ABNORMAL HIGH (ref 70–99)
HCT: 38 % — ABNORMAL LOW (ref 39.0–52.0)
Hemoglobin: 12.9 g/dL — ABNORMAL LOW (ref 13.0–17.0)
Potassium: 3.8 mmol/L (ref 3.5–5.1)
Sodium: 141 mmol/L (ref 135–145)
TCO2: 28 mmol/L (ref 22–32)

## 2022-10-28 LAB — POCT ACTIVATED CLOTTING TIME
Activated Clotting Time: 250 seconds
Activated Clotting Time: 233 seconds
Activated Clotting Time: 260 seconds

## 2022-10-28 SURGERY — ABDOMINAL AORTOGRAM W/LOWER EXTREMITY
Anesthesia: LOCAL

## 2022-10-28 MED ORDER — OXYCODONE HCL 5 MG PO TABS: 5.0000 mg | ORAL_TABLET | ORAL | Status: DC | PRN

## 2022-10-28 MED ORDER — CLOPIDOGREL BISULFATE 75 MG PO TABS: 75.0000 mg | ORAL_TABLET | Freq: Every day | ORAL | Status: DC

## 2022-10-28 MED ORDER — SODIUM CHLORIDE 0.9% FLUSH: 3.0000 mL | INTRAVENOUS | Status: DC | PRN

## 2022-10-28 MED ORDER — HEPARIN SODIUM (PORCINE) 1000 UNIT/ML IJ SOLN
INTRAMUSCULAR | Status: AC
  Filled 2022-10-28: qty 10

## 2022-10-28 MED ORDER — HEPARIN SODIUM (PORCINE) 1000 UNIT/ML IJ SOLN
INTRAMUSCULAR | Status: DC | PRN
  Administered 2022-10-28: 2000 [IU] via INTRAVENOUS
  Administered 2022-10-28: 9000 [IU] via INTRAVENOUS

## 2022-10-28 MED ORDER — ACETAMINOPHEN 325 MG PO TABS: 650.0000 mg | ORAL_TABLET | ORAL | Status: DC | PRN

## 2022-10-28 MED ORDER — FENTANYL CITRATE (PF) 100 MCG/2ML IJ SOLN
INTRAMUSCULAR | Status: AC
  Filled 2022-10-28: qty 2

## 2022-10-28 MED ORDER — LIDOCAINE HCL (PF) 1 % IJ SOLN
INTRAMUSCULAR | Status: AC
  Filled 2022-10-28: qty 30

## 2022-10-28 MED ORDER — MIDAZOLAM HCL 2 MG/2ML IJ SOLN
INTRAMUSCULAR | Status: AC
  Filled 2022-10-28: qty 2

## 2022-10-28 MED ORDER — HYDRALAZINE HCL 20 MG/ML IJ SOLN: 5.0000 mg | INTRAMUSCULAR | Status: DC | PRN

## 2022-10-28 MED ORDER — MIDAZOLAM HCL 2 MG/2ML IJ SOLN
INTRAMUSCULAR | Status: DC | PRN
  Administered 2022-10-28: 2 mg via INTRAVENOUS
  Administered 2022-10-28 (×3): 1 mg via INTRAVENOUS

## 2022-10-28 MED ORDER — HEPARIN (PORCINE) IN NACL 1000-0.9 UT/500ML-% IV SOLN
INTRAVENOUS | Status: DC | PRN
  Administered 2022-10-28 (×2): 500 mL

## 2022-10-28 MED ORDER — ASPIRIN 81 MG PO TBEC
81.0000 mg | DELAYED_RELEASE_TABLET | Freq: Every day | ORAL | Status: DC
  Administered 2022-10-28: 81 mg via ORAL
  Filled 2022-10-28: qty 1

## 2022-10-28 MED ORDER — ONDANSETRON HCL 4 MG/2ML IJ SOLN: 4.0000 mg | Freq: Four times a day (QID) | INTRAMUSCULAR | Status: DC | PRN

## 2022-10-28 MED ORDER — SODIUM CHLORIDE 0.9 % IV SOLN: INTRAVENOUS | Status: DC

## 2022-10-28 MED ORDER — IODIXANOL 320 MG/ML IV SOLN
INTRAVENOUS | Status: DC | PRN
  Administered 2022-10-28: 100 mL

## 2022-10-28 MED ORDER — MORPHINE SULFATE (PF) 2 MG/ML IV SOLN: 2.0000 mg | INTRAVENOUS | Status: DC | PRN

## 2022-10-28 MED ORDER — LABETALOL HCL 5 MG/ML IV SOLN: 10.0000 mg | INTRAVENOUS | Status: DC | PRN

## 2022-10-28 MED ORDER — SODIUM CHLORIDE 0.9% FLUSH: 3.0000 mL | Freq: Two times a day (BID) | INTRAVENOUS | Status: DC

## 2022-10-28 MED ORDER — SODIUM CHLORIDE 0.9 % IV SOLN: 250.0000 mL | INTRAVENOUS | Status: DC | PRN

## 2022-10-28 MED ORDER — LIDOCAINE HCL (PF) 1 % IJ SOLN
INTRAMUSCULAR | Status: DC | PRN
  Administered 2022-10-28: 10 mL

## 2022-10-28 MED ORDER — FENTANYL CITRATE (PF) 100 MCG/2ML IJ SOLN
INTRAMUSCULAR | Status: DC | PRN
  Administered 2022-10-28 (×2): 25 ug via INTRAVENOUS
  Administered 2022-10-28: 50 ug via INTRAVENOUS
  Administered 2022-10-28: 25 ug via INTRAVENOUS

## 2022-10-28 MED ORDER — SODIUM CHLORIDE 0.9 % WEIGHT BASED INFUSION: 1.0000 mL/kg/h | INTRAVENOUS | Status: DC

## 2022-10-28 SURGICAL SUPPLY — 32 items
BALLN COYOTE ES OTW 1.5X20X142 (BALLOONS) ×2
BALLN COYOTE OTW 3X100X150 (BALLOONS) ×2
BALLN COYOTE OTW 3X80X150 (BALLOONS) ×2
BALLN STERLI SL OTW 2.5X80X150 (BALLOONS) ×2
BALLN STERLING OTW 2X20X150 (BALLOONS) ×2
BALLOON COYOTE OTW 3X100X150 (BALLOONS) IMPLANT
BALLOON COYOTE OTW 3X80X150 (BALLOONS) IMPLANT
BALLOON CYTE ES OTW 1.5X20X142 (BALLOONS) IMPLANT
BALLOON STERLING OTW 2X20X150 (BALLOONS) IMPLANT
BALLOON STRLNG OTW 2.5X80X150 (BALLOONS) IMPLANT
CATH OMNI FLUSH 5F 65CM (CATHETERS) IMPLANT
CATH QUICKCROSS ANG SELECT (CATHETERS) IMPLANT
CATH SHOCKWAVE M5 4.5X60 (CATHETERS) IMPLANT
CLOSURE MYNX CONTROL 6F/7F (Vascular Products) IMPLANT
DCB RANGER 5.0X60 135 (BALLOONS) IMPLANT
KIT ENCORE 26 ADVANTAGE (KITS) IMPLANT
KIT MICROPUNCTURE NIT STIFF (SHEATH) IMPLANT
KIT PV (KITS) ×3 IMPLANT
RANGER DCB 5.0X60 135 (BALLOONS) ×2
SHEATH CATAPULT 6FR 45 (SHEATH) IMPLANT
SHEATH PINNACLE 5F 10CM (SHEATH) IMPLANT
SHEATH PINNACLE 6F 10CM (SHEATH) IMPLANT
SHEATH PROBE COVER 6X72 (BAG) IMPLANT
STOPCOCK MORSE 400PSI 3WAY (MISCELLANEOUS) IMPLANT
SYR MEDRAD MARK 7 150ML (SYRINGE) ×3 IMPLANT
TRANSDUCER W/STOPCOCK (MISCELLANEOUS) ×3 IMPLANT
TRAY PV CATH (CUSTOM PROCEDURE TRAY) ×3 IMPLANT
TUBING CIL FLEX 10 FLL-RA (TUBING) IMPLANT
WIRE G V18X300CM (WIRE) IMPLANT
WIRE SHEPHERD 6G .014 (WIRE) IMPLANT
WIRE SPARTACORE .014X300CM (WIRE) IMPLANT
WIRE STARTER BENTSON 035X150 (WIRE) IMPLANT

## 2022-10-28 NOTE — Op Note (Signed)
Patient name: Clinton [name] MRN: [id number] DOB: Nov 27, 1955 Sex: male  10/28/2022 Pre-operative Diagnosis: Left leg ulcer Post-operative diagnosis:  Same Surgeon:  [name] Procedure Performed:  1.  Ultrasound-guided access, right femoral artery  2.  Abdominal aortogram  3.  Left lower extremity runoff  4.  Shockwave intra-arterial lithotripsy, left popliteal artery  5.  Shockwave intra-arterial lithotripsy, left tibioperoneal trunk  6.  Drug-coated balloon angioplasty, left popliteal artery  7.  Drug-coated balloon angioplasty, left tibioperoneal trunk  8.  Angioplasty, left peroneal artery  9.  Angioplasty, left posterior tibial artery  10.  Conscious sedation, 151 minutes  11.  Closure device, Mynx   Indications: This is a 67 year old gentleman who has previously undergone revascularization of the left leg.  He has developed new wounds.  He comes in today for angiographic evaluation and possible intervention  Procedure:  The patient was identified in the holding area and taken to room 8.  The patient was then placed supine on the table and prepped and draped in the usual sterile fashion.  A time out was called.  Conscious sedation was administered with the use of IV fentanyl and Versed under continuous physician and nurse monitoring.  Heart rate, blood pressure, and oxygen saturation were continuously monitored.  Total sedation time was 151 minutes.  Ultrasound was used to evaluate the right common femoral artery.  It was patent .  A digital ultrasound image was acquired.  A micropuncture needle was used to access the right common femoral artery under ultrasound guidance.  An 018 wire was advanced without resistance and a micropuncture sheath was placed.  The 018 wire was removed and a benson wire was placed.  The micropuncture sheath was exchanged for a 5 french sheath.  An omniflush catheter was advanced over the wire to the level of L-1.  An abdominal angiogram was obtained.   Next, using the omniflush catheter and a benson wire, the aortic bifurcation was crossed and the catheter was placed into theleft external iliac artery and left runoff was obtained.    Findings:   Aortogram: No significant renal or aortic stenosis.  No significant iliac stenosis bilaterally  Right Lower Extremity: Not evaluated  Left Lower Extremity: Left common femoral and profundofemoral artery are widely patent.  The superficial femoral artery is patent without stenosis but heavily calcified.  The above-knee popliteal artery is without flow-limiting stenosis.  The below-knee popliteal artery is heavily calcified and diffusely diseased.  There is approximately a 80-90% stenosis at the distal popliteal artery at the level of the anterior tibial artery.  The peroneal artery and anterior tibial artery are patent down to the ankle.  There is a short segment occlusion of the posterior tibial artery which does reconstitute and is patent down to the ankle.  There is diffuse disease out onto the foot  Intervention: After the above images were acquired the decision was made to proceed with intervention.  A 6 French catapulted sheath was inserted into the left external iliac artery.  The patient was fully heparinized.  Heparin levels were redosed throughout the procedure.  Next using a 3 x 100 Sterling balloon and a V-18 wire, I was able to get wire access down into the peroneal artery.  I then switched out for a 014 wire.  I then got buddy wire access into the anterior tibial artery with a 014 [place] core wire.  A 4.5 mm shockwave lithotripsy balloon was then advanced into the below-knee popliteal artery extending  across the lesion into the proximal tibioperoneal trunk.  I then performed shockwave lithotripsy for multiple 30 second  runs.  Once this was completed, a 4 x 60 drug-coated Ranger balloon was used to perform drug-coated balloon angioplasty of the below-knee popliteal artery and proximal tibioperoneal  trunk.  Follow-up imaging revealed significant improvement with residual stenosis less than 15%.  Attention was then turned towards the peroneal artery.  I performed primary balloon angioplasty using a 3 mm balloon for 10 cm.  I then tried to cross the total occlusion of the proximal peroneal artery.  I used a quick cross select catheter and a 6 g 014 Wire.  With significant difficulty, I was ultimately able to get wire access across the occlusion which was confirmed with a catheter injection and the posterior tibial artery.  I then tried to cross the lesion with a balloon, but was unable to do so.  I had to switch out for another 014 wire.  I was ultimately able to get a 1.5 x 20 coyote balloon across the occlusion.  I then performed balloon angioplasty to create a channel.  Next, a 3 mm balloon was used to perform balloon angioplasty of the posterior tibial artery.  Completion imaging was then performed which showed inline flow through all 3 tibial vessels down to the ankle with diffuse disease out onto the foot.  I was satisfied with these results and elected to stop the procedure.  The long sheath was exchanged out for a short 6 [place] sheath, and the groin was closed with a Mynx  Impression:  #1  Diffuse disease throughout the below-knee left popliteal artery which was heavily calcified and had a 80-90% stenosis at its distal extent.  With a wire protecting the anterior tibial artery, shockwave lithotripsy using a 4.5 mm balloon was performed in the proximal tibioperoneal trunk and below-knee popliteal artery.  This was followed up with a drug-coated balloon.  #2  Diffuse narrowing in the proximal peroneal artery treated with a 3 mm balloon  #3  Short segment occlusion of the origin of the left posterior tibial artery.  This was very difficult to cross but was ultimately able to be crossed and treated with a 3 mm balloon.  #4  The patient now has inline flow through his popliteal artery and all 3 tibial  vessels down to the ankle.  There is diffuse disease out onto the foot.  #5  Currently, the patient is optimally revascularized   V. [name], M.D., [facility] [place] Office: [phone] Pager:  [phone]

## 2022-10-28 NOTE — Interval H&P Note (Signed)
History and Physical Interval Note:  10/28/2022 9:55 AM  Clinton [name]  has presented today for surgery, with the diagnosis of atherosclerosis of the native artery of the extremity with ulceration.  The various methods of treatment have been discussed with the patient and family. After consideration of risks, benefits and other options for treatment, the patient has consented to  Procedure(s): ABDOMINAL AORTOGRAM W/LOWER EXTREMITY (N/A) as a surgical intervention.  The patient's history has been reviewed, patient examined, no change in status, stable for surgery.  I have reviewed the patient's chart and labs.  Questions were answered to the patient's satisfaction.     [name]

## 2022-10-28 NOTE — Addendum Note (Signed)
Addended by: Nicholas Lose on: 10/28/2022 08:32 AM   Modules accepted: Orders

## 2022-10-29 ENCOUNTER — Encounter (HOSPITAL_COMMUNITY): Payer: Self-pay | Admitting: Surgery

## 2022-10-29 ENCOUNTER — Encounter: Payer: 59 | Admitting: Physical Therapy

## 2022-10-29 ENCOUNTER — Encounter: Payer: Self-pay | Admitting: Surgery

## 2022-10-30 ENCOUNTER — Encounter (HOSPITAL_COMMUNITY): Payer: Self-pay | Admitting: Surgery

## 2022-11-04 ENCOUNTER — Encounter: Payer: Self-pay | Admitting: Physical Therapy

## 2022-11-04 ENCOUNTER — Ambulatory Visit (INDEPENDENT_AMBULATORY_CARE_PROVIDER_SITE_OTHER): Payer: 59 | Admitting: Physical Therapy

## 2022-11-04 DIAGNOSIS — R2681 Unsteadiness on feet: Secondary | ICD-10-CM | POA: Diagnosis not present

## 2022-11-04 DIAGNOSIS — M6281 Muscle weakness (generalized): Secondary | ICD-10-CM

## 2022-11-04 DIAGNOSIS — R2689 Other abnormalities of gait and mobility: Secondary | ICD-10-CM

## 2022-11-04 DIAGNOSIS — L98498 Non-pressure chronic ulcer of skin of other sites with other specified severity: Secondary | ICD-10-CM | POA: Diagnosis not present

## 2022-11-04 NOTE — Therapy (Signed)
OUTPATIENT PHYSICAL THERAPY TREATMENT   Patient Name: Clinton [name] MRN: [id number] DOB:06/12/1956, 67 y.o., male Today's Date: 11/04/2022  PCP: Lujean Amel, MD REFERRING PROVIDER: Reesa Chew, PA-C  END OF SESSION:  PT End of Session - 11/04/22 1514     Visit Number 3    Number of Visits 20    Date for PT Re-Evaluation 01/15/23    Authorization Type [employer]    Authorization Time Period 60 PT visits, 10% co-insurance    Authorization - Number of Visits 20    Progress Note Due on Visit 10    PT Start Time 1515    PT Stop Time 1558    PT Time Calculation (min) 43 min    Activity Tolerance Patient tolerated treatment well    Behavior During Therapy WFL for tasks assessed/performed              Past Medical History:  Diagnosis Date   Carotid artery occlusion    Cellulitis    Erectile dysfunction    Hyperlipidemia    Hypertension    Peripheral vascular disease ([place])    Past Surgical History:  Procedure Laterality Date   ABDOMINAL AORTOGRAM W/LOWER EXTREMITY N/A 02/04/2022   Procedure: ABDOMINAL AORTOGRAM W/LOWER EXTREMITY;  Surgeon: Serafina Mitchell, MD;  Location: [facility];  Service: Cardiovascular;  Laterality: N/A;   ABDOMINAL AORTOGRAM W/LOWER EXTREMITY N/A 02/18/2022   Procedure: ABDOMINAL AORTOGRAM W/LOWER EXTREMITY;  Surgeon: Serafina Mitchell, MD;  Location: [place] [facility];  Service: Cardiovascular;  Laterality: N/A;   ABDOMINAL AORTOGRAM W/LOWER EXTREMITY N/A 04/15/2022   Procedure: ABDOMINAL AORTOGRAM W/LOWER EXTREMITY;  Surgeon: Serafina Mitchell, MD;  Location: [place] [facility];  Service: Cardiovascular;  Laterality: N/A;   ABDOMINAL AORTOGRAM W/LOWER EXTREMITY N/A 10/28/2022   Procedure: ABDOMINAL AORTOGRAM W/LOWER EXTREMITY;  Surgeon: Serafina Mitchell, MD;  Location: [place] [facility];  Service: Cardiovascular;  Laterality: N/A;   AMPUTATION Right 04/16/2022   Procedure: RIGHT 5TH RAY AMPUTATION;  Surgeon: Newt Minion, MD;  Location: [place];  Service: Orthopedics;  Laterality: Right;   AMPUTATION Right 05/09/2022   Procedure: RIGHT BELOW KNEE AMPUTATION;  Surgeon: Newt Minion, MD;  Location: [place];  Service: Orthopedics;  Laterality: Right;   APPLICATION OF WOUND VAC Right 06/20/2022   Procedure: APPLICATION OF WOUND VAC;  Surgeon: Newt Minion, MD;  Location: [place];  Service: Orthopedics;  Laterality: Right;   COLONOSCOPY     ENDARTERECTOMY Right 09/05/2015   Procedure: ENDARTERECTOMY CAROTID RIGHT;  Surgeon: Elam Dutch, MD;  Location: [facility];  Service: Vascular;  Laterality: Right;   INTRAVASCULAR LITHOTRIPSY Left 10/28/2022   Procedure: INTRAVASCULAR LITHOTRIPSY;  Surgeon: Serafina Mitchell, MD;  Location: [place] [facility];  Service: Cardiovascular;  Laterality: Left;  L peroneal   PATCH ANGIOPLASTY  09/05/2015   Procedure: PATCH ANGIOPLASTY RIGHT CAROTID ARTERY USING HEMASHIELD PLATINUM FINESSE PATCH;  Surgeon: Elam Dutch, MD;  Location: [place];  Service: Vascular;;   PERIPHERAL VASCULAR ATHERECTOMY Left 02/18/2022   Procedure: PERIPHERAL VASCULAR ATHERECTOMY;  Surgeon: Serafina Mitchell, MD;  Location: [place] [facility];  Service: Cardiovascular;  Laterality: Left;  Popliteal and Peroneal   PERIPHERAL VASCULAR BALLOON ANGIOPLASTY  02/04/2022   Procedure: PERIPHERAL VASCULAR BALLOON ANGIOPLASTY;  Surgeon: Serafina Mitchell, MD;  Location: [place] [facility];  Service: Cardiovascular;;   PERIPHERAL VASCULAR BALLOON ANGIOPLASTY Right 04/15/2022   Procedure: PERIPHERAL VASCULAR BALLOON ANGIOPLASTY;  Surgeon: Harold Barban  W, MD;  Location: [place] [facility];  Service: Cardiovascular;  Laterality: Right;  SFA/POPLITEAL   PERIPHERAL VASCULAR BALLOON ANGIOPLASTY Left 10/28/2022   Procedure: PERIPHERAL VASCULAR BALLOON ANGIOPLASTY;  Surgeon: Serafina Mitchell, MD;  Location: [place] [facility];  Service: Cardiovascular;  Laterality: Left;  L peroneal and L pop   STUMP REVISION Right 06/20/2022   Procedure: REVISION RIGHT  BELOW KNEE AMPUTATION;  Surgeon: Newt Minion, MD;  Location: [place];  Service: Orthopedics;  Laterality: Right;   TONSILLECTOMY     VASECTOMY     Patient Active Problem List   Diagnosis Date Noted   Elevated coronary artery calcium score 08/06/2022   Dehiscence of amputation stump of right lower extremity ([place]) 06/20/2022   Acute blood loss anemia 05/19/2022   Post-op pain 05/19/2022   Acute renal failure ([place]) 05/19/2022   S/P BKA (below knee amputation), right ([place]) 05/14/2022   Below-knee amputation of right lower extremity ([place]) 05/09/2022   Gangrene of right foot ([facility])    PAD (peripheral artery disease) ([place]) 04/17/2022   Acute osteomyelitis of metatarsal bone of right foot ([facility])    Atherosclerosis of native arteries of the extremities with ulceration ([place]) 04/15/2022   Peripheral arterial disease ([place]) 03/25/2022   Obesity 03/21/2022   Prediabetes 03/21/2022   Erectile dysfunction 07/26/2021   Family history of ischemic heart disease (IHD) 07/26/2021   Pure hypercholesterolemia 07/26/2021   History of adenomatous polyp of colon 02/03/2019   Internal hemorrhoids 02/03/2019   Carotid artery stenosis, asymptomatic 09/05/2015   Cellulitis    Hypertension    Hyperlipidemia    Occlusion and stenosis of carotid artery without mention of cerebral infarction 01/01/2012    ONSET DATE: 10/15/2022 prosthesis delivery  REFERRING DIAG: [id number] (ICD-10-CM) - Below-knee amputation of right lower extremity   THERAPY DIAG:  Other abnormalities of gait and mobility  Unsteadiness on feet  Non-pressure chronic ulcer of skin of other sites with other specified severity ([facility])  Muscle weakness (generalized)  Rationale for Evaluation and Treatment: Rehabilitation  SUBJECTIVE:   SUBJECTIVE STATEMENT:  He had vascular procedure for LLE on 3/19 with entry right groin. No precautions.  He has been wearing prosthesis 4 hrs 2-3x/day Wed, Thurs & Fri but noticed some dry skin cracks. No wear  Sat. Then Nancy Fetter couple of hours. Then Mon back to 3-4 hours 2x/day.   PERTINENT HISTORY: carotid artery occlusion, HTN, PVD, BLE vascular sugeries  PAIN:  Are you having pain? No  PRECAUTIONS: None  WEIGHT BEARING RESTRICTIONS: No  FALLS: Has patient fallen in last 6 months? No  LIVING ENVIRONMENT: Lives with: lives with their spouse Lives in: House  single level Home Access: Stairs to enter Home layout: One level Stairs: Yes: External: 3 steps; can reach both Has following equipment at home: Single point cane, Walker - 2 wheeled, Wheelchair (manual), shower chair, and knee scooter  PLOF: Independent, Independent with household mobility without device, and Independent with community mobility without device  circulation issues started in 2019 with worsening 2021,   PATIENT GOALS:   to use prosthesis to travel, golf, climb ladder, go to gym, active in community including coaching,    OBJECTIVE:  COGNITION: Overall cognitive status: Within functional limits for tasks assessed   SENSATION: WFL  POSTURE: flexed trunk  and weight shift left  LOWER EXTREMITY ROM:  ROM P:passive  A:active Right eval Left eval  Hip flexion    Hip extension    Hip abduction    Hip adduction  Hip internal rotation    Hip external rotation    Knee flexion WFL   Knee extension WFL   Ankle dorsiflexion    Ankle plantarflexion    Ankle inversion    Ankle eversion     (Blank rows = not tested)  LOWER EXTREMITY MMT:  MMT Right eval Left eval  Hip flexion 5/5 5/5  Hip extension 4/5   Hip abduction 4/5   Hip adduction    Hip internal rotation    Hip external rotation    Knee flexion 4/5   Knee extension 4/5   Ankle dorsiflexion    Ankle plantarflexion    Ankle inversion    Ankle eversion    (Blank rows = not tested)  TRANSFERS: Sit to stand: Modified independence Stand to sit: Modified independence  GAIT: Gait pattern: step to pattern, decreased step length- Left, decreased  stance time- Right, decreased hip/knee flexion- Right, Right hip hike, knee flexed in stance- Right, antalgic, trunk flexed, and abducted- Right Distance walked: 50' Assistive device utilized: Environmental consultant - 2 wheeled and TTA prosthesis Level of assistance: SBA / cues for deviations no balance issues noted with RW Comments: due to wound issues PT recommending RW for BUE support initially  FUNCTIONAL TESTs:  Eval / 10/22/2022:  Merrilee Jansky Balance Scale: 36/56    CURRENT PROSTHETIC WEAR ASSESSMENT: Eval / 10/22/2022: Patient is dependent with: skin check, residual limb care, prosthetic cleaning, ply sock cleaning, correct ply sock adjustment, proper wear schedule/adjustment, and proper weight-bearing schedule/adjustment Donning prosthesis: SBA Doffing prosthesis: Modified independence Prosthetic wear tolerance: 1-1.5 hours, 2x/day, for last 2 days.  He wore prosthesis 1hr 2x on 3/8 and got water blisters.  Stopped wear on 3/9 & 3/10, resumed 3/11 1.5 hrs 2x/day for 2 days.  Prosthetic weight bearing tolerance: 5 minutes Edema: pitting Residual limb condition: draining (serous) wound at distal tibia, 2nd open blister on distal limb, minimal hair growth, cylindrical shape, no warmth or abnormal color changes noted.  Prosthetic description: silicon liner with pin lock suspension, total contact socket with flexible inner liner, 0-ply socks, dynamic response foot K code/activity level with prosthetic use: Level 3    TODAY'S TREATMENT:                                                                                                                             DATE:  11/04/2022: Prosthetic Training with TTA prosthesis: Pt reported some distal limb "cracking" so altered wear. See subjective.  Limb has distal dry scab. No blisters noted.  PT recommending use of moisturizer only at night and removing in the morning.  If he notices dry skin during the day he can apply the moisturizer at the beginning of his 2-hour  break and remove it prior to redonning the prosthesis.  PT instructed patient and wife and signs of sweating in the prosthesis with recommendation to remove and pat dry limb and liner then immediately redonning the prosthesis.  Patient and wife  verbalized understanding. Patient continues to wear Vive Wear under - liner shrinker against skin for wound healing.  The socket arrived wearing was too long creating increased pressure around the knee area.  PT cut it off.  PT reviewed need to adjust ply socks during the day as activity can pump fluid out of his residual limb.  Patient verbalized understanding of above  Neuromuscular Re-ed for balance HEP: PT attempted alternate lower extremity kicks but patient's right hip is not strong enough for this activity yet.  1.  Sidestepping right and left along counter with cues on engaging bilateral lower extremities.   2.  Counter RUE and cane LUE -walking forward with equal step length using heel toe as marker and walking backwards with weight shift over the prosthesis in stance  3.  Weight shift over RLE in stance with tightening hip muscles for stabilization tapping left forefoot in lower cabinet  4. Sit to / from stand 18" chair without armrest using upper extremities lightly on seat.  PT demo and verbal cues on technique to decrease upper extremity support and increased lower extremity utilization.  5.  Picking up washcloth from floor. PT demo & verbal cues on technique with TTA prosthesis and set-up for safety.    10/27/2022 Prosthetic Training with TTA prosthesis: Pt arrived using single point cane. PT adjusted height & ensuring lock ring tight. Pt amb 100' X 2 with supervision.  Wound at distal tibia with invagination, serous drainage & scab present with no signs of infection.  PT recommending wear up to 4 hours 3x/day with check underliner every 2 hours for dampness with switching.  Needs to remove prosthesis & liner using shrinker for 2 hours between  wear to allow skin / wound to dry. Pt & significant other verbalized understanding.  PT instructed in adjusting ply socks with too few, too many & correct ply fit.  Pt verbalized better understanding.   10/22/2022:  PATIENT EDUCATION: PATIENT EDUCATED ON FOLLOWING PROSTHETIC CARE: Education details: PT instructed in use of Vivewear under liner with prosthesis wear. When prosthesis is off, Vivewear & tubular compression shrinker.   Skin check, Residual limb care, Prosthetic cleaning, Correct ply sock adjustment, [place]pper donning, Proper wear schedule/adjustment, and Proper weight-bearing schedule/adjustment Prosthetic wear tolerance: 1.5 hours 3x/day, 7 days/week with plans to increase every 5-7 days depending on issues. Person educated: Patient and Spouse Education method: Explanation, Demonstration, Tactile cues, and Verbal cues Education comprehension: verbalized understanding, verbal cues required, tactile cues required, and needs further education  HOME EXERCISE PROGRAM: HEP 11/04/2022 with PT demo & verbal cues. Pt & wife verbalized understanding with Pt return demo.  1.  Sidestepping right and left along counter with cues on engaging bilateral lower extremities. 10' 3 laps.  2.  Counter RUE and cane LUE -walking forward with equal step length using heel toe as marker and walking backwards with weight shift over the prosthesis in stance. 10' 3 laps.   3.  Weight shift over RLE in stance with tightening hip muscles for stabilization tapping left forefoot in lower cabinet. 10 reps 2 sets.  4. Sit to / from stand 18" chair without armrest using upper extremities lightly on seat.  PT demo and verbal cues on technique to decrease upper extremity support and increased lower extremity utilization.  10 reps 2 sets.  5.  Picking up washcloth from floor. PT demo & verbal cues on technique with TTA prosthesis and set-up for safety.  10 reps 2 sets.   ASSESSMENT:  CLINICAL IMPRESSION: Patient appears  to understand limb care including sweat management.  He appears to understand HEP instructed today with goal of strength, balance & function.  Patient continues to benefit from skilled PT.   OBJECTIVE IMPAIRMENTS: Abnormal gait, decreased activity tolerance, decreased balance, decreased knowledge of condition, decreased knowledge of use of DME, decreased mobility, difficulty walking, decreased strength, increased edema, prosthetic dependency , and wound on residual limb .   ACTIVITY LIMITATIONS: carrying, lifting, bending, standing, squatting, stairs, transfers, locomotion level, and prosthesis use  PARTICIPATION LIMITATIONS: meal prep, cleaning, driving, community activity, occupation, yard work, and recreational activities.  PERSONAL FACTORS: Fitness, Time since onset of injury/illness/exacerbation, and 3+ comorbidities: see PMH  are also affecting patient's functional outcome.   REHAB POTENTIAL: Good  CLINICAL DECISION MAKING: Evolving/moderate complexity  EVALUATION COMPLEXITY: Moderate   GOALS: Goals reviewed with patient? Yes  SHORT TERM GOALS: Target date: 11/20/2022  Patient donnes prosthesis modified independent & verbalizes proper cleaning. Baseline: SEE OBJECTIVE DATA Goal status: Ongoing 10/27/2022 2.  Patient tolerates prosthesis >10 hrs total /day without skin issues increasing and no limb pain. Baseline: SEE OBJECTIVE DATA Goal status: Ongoing 10/27/2022  3.  Berg >/= 45/56. Baseline: SEE OBJECTIVE DATA Goal status: Ongoing 10/27/2022  4. Patient ambulates 100' with cane & prosthesis with supervision. Baseline: SEE OBJECTIVE DATA Goal status: Ongoing 10/27/2022  5. Patient negotiates ramps & curbs with cane & prosthesis with minA. Baseline: SEE OBJECTIVE DATA Goal status: Ongoing 10/27/2022  LONG TERM GOALS: Target date: 01/15/2023  Patient demonstrates & verbalized understanding of prosthetic care to enable safe utilization of prosthesis. Baseline: SEE OBJECTIVE  DATA Goal status: Ongoing 10/27/2022  Patient tolerates prosthesis wear >90% of awake hours without skin or limb pain issues. Baseline: SEE OBJECTIVE DATA Goal status: Ongoing 10/27/2022  Functional Gait Assessment >/= 19/30 to indicate lower fall risk Baseline: SEE OBJECTIVE DATA Goal status: Ongoing 10/27/2022  Patient ambulates >500' with prosthesis only independently Baseline: SEE OBJECTIVE DATA Goal status: Ongoing 10/27/2022  Patient negotiates ramps, curbs & stairs with single rail with prosthesis only independently. Baseline: SEE OBJECTIVE DATA Goal status: Ongoing 10/27/2022  Patient demonstrates & verbalizes ability to perform yard work & golf with prosthesis only safely. Baseline: SEE OBJECTIVE DATA Goal status: Ongoing 10/27/2022   PLAN:  PT FREQUENCY: 1-2x/week  PT DURATION: 12 weeks  PLANNED INTERVENTIONS: Therapeutic exercises, Therapeutic activity, Neuromuscular re-education, Balance training, Gait training, Patient/Family education, Self Care, Stair training, Prosthetic training, Re-evaluation, and physical performance testing.  PLAN FOR NEXT SESSION:  check HEP, check limb and review prosthetic care issues, therapeutic exercise & balance activities.   Jamey Reas, PT, DPT 11/04/2022, 4:51 PM

## 2022-11-06 ENCOUNTER — Ambulatory Visit (INDEPENDENT_AMBULATORY_CARE_PROVIDER_SITE_OTHER): Payer: 59 | Admitting: Physical Therapy

## 2022-11-06 DIAGNOSIS — M6281 Muscle weakness (generalized): Secondary | ICD-10-CM

## 2022-11-06 DIAGNOSIS — R2689 Other abnormalities of gait and mobility: Secondary | ICD-10-CM | POA: Diagnosis not present

## 2022-11-06 DIAGNOSIS — R2681 Unsteadiness on feet: Secondary | ICD-10-CM

## 2022-11-06 DIAGNOSIS — L98498 Non-pressure chronic ulcer of skin of other sites with other specified severity: Secondary | ICD-10-CM

## 2022-11-06 NOTE — Therapy (Signed)
OUTPATIENT PHYSICAL THERAPY TREATMENT   Patient Name: Clinton [name] MRN: [id number] DOB:1956/05/21, 67 y.o., male Today's Date: 11/06/2022  PCP: Lujean Amel, MD REFERRING PROVIDER: Reesa Chew, PA-C  END OF SESSION:  PT End of Session - 11/06/22 1513     Visit Number 4    Number of Visits 20    Date for PT Re-Evaluation 01/15/23    Authorization Type [employer]    Authorization Time Period 21 PT visits, 10% co-insurance    Authorization - Visit Number 3    Authorization - Number of Visits 20    Progress Note Due on Visit 10    PT Start Time 1515    PT Stop Time 1600    PT Time Calculation (min) 45 min    Activity Tolerance Patient tolerated treatment well    Behavior During Therapy WFL for tasks assessed/performed              Past Medical History:  Diagnosis Date   Carotid artery occlusion    Cellulitis    Erectile dysfunction    Hyperlipidemia    Hypertension    Peripheral vascular disease ([place])    Past Surgical History:  Procedure Laterality Date   ABDOMINAL AORTOGRAM W/LOWER EXTREMITY N/A 02/04/2022   Procedure: ABDOMINAL AORTOGRAM W/LOWER EXTREMITY;  Surgeon: Serafina [place], MD;  Location: [place] [facility];  Service: Cardiovascular;  Laterality: N/A;   ABDOMINAL AORTOGRAM W/LOWER EXTREMITY N/A 02/18/2022   Procedure: ABDOMINAL AORTOGRAM W/LOWER EXTREMITY;  Surgeon: Serafina [place], MD;  Location: [place] [facility];  Service: Cardiovascular;  Laterality: N/A;   ABDOMINAL AORTOGRAM W/LOWER EXTREMITY N/A 04/15/2022   Procedure: ABDOMINAL AORTOGRAM W/LOWER EXTREMITY;  Surgeon: Serafina [place], MD;  Location: [place] [facility];  Service: Cardiovascular;  Laterality: N/A;   ABDOMINAL AORTOGRAM W/LOWER EXTREMITY N/A 10/28/2022   Procedure: ABDOMINAL AORTOGRAM W/LOWER EXTREMITY;  Surgeon: Serafina [place], MD;  Location: [place] [facility];  Service: Cardiovascular;  Laterality: N/A;   AMPUTATION Right 04/16/2022   Procedure: RIGHT 5TH RAY AMPUTATION;  Surgeon:  Newt Minion, MD;  Location: [place];  Service: Orthopedics;  Laterality: Right;   AMPUTATION Right 05/09/2022   Procedure: RIGHT BELOW KNEE AMPUTATION;  Surgeon: Newt Minion, MD;  Location: [place];  Service: Orthopedics;  Laterality: Right;   APPLICATION OF WOUND VAC Right 06/20/2022   Procedure: APPLICATION OF WOUND VAC;  Surgeon: Newt Minion, MD;  Location: [place];  Service: Orthopedics;  Laterality: Right;   COLONOSCOPY     ENDARTERECTOMY Right 09/05/2015   Procedure: ENDARTERECTOMY CAROTID RIGHT;  Surgeon: Elam Dutch, MD;  Location: [facility];  Service: Vascular;  Laterality: Right;   INTRAVASCULAR LITHOTRIPSY Left 10/28/2022   Procedure: INTRAVASCULAR LITHOTRIPSY;  Surgeon: Serafina [place], MD;  Location: [facility];  Service: Cardiovascular;  Laterality: Left;  L peroneal   PATCH ANGIOPLASTY  09/05/2015   Procedure: PATCH ANGIOPLASTY RIGHT CAROTID ARTERY USING HEMASHIELD PLATINUM FINESSE PATCH;  Surgeon: Elam Dutch, MD;  Location: [place];  Service: Vascular;;   PERIPHERAL VASCULAR ATHERECTOMY Left 02/18/2022   Procedure: PERIPHERAL VASCULAR ATHERECTOMY;  Surgeon: Serafina [place], MD;  Location: [place] [facility];  Service: Cardiovascular;  Laterality: Left;  Popliteal and Peroneal   PERIPHERAL VASCULAR BALLOON ANGIOPLASTY  02/04/2022   Procedure: PERIPHERAL VASCULAR BALLOON ANGIOPLASTY;  Surgeon: Serafina [place], MD;  Location: [place] [facility];  Service: Cardiovascular;;   PERIPHERAL VASCULAR BALLOON ANGIOPLASTY Right 04/15/2022   Procedure:  PERIPHERAL VASCULAR BALLOON ANGIOPLASTY;  Surgeon: Serafina [place], MD;  Location: [place] [facility];  Service: Cardiovascular;  Laterality: Right;  SFA/POPLITEAL   PERIPHERAL VASCULAR BALLOON ANGIOPLASTY Left 10/28/2022   Procedure: PERIPHERAL VASCULAR BALLOON ANGIOPLASTY;  Surgeon: Serafina [place], MD;  Location: [place] [facility];  Service: Cardiovascular;  Laterality: Left;  L peroneal and L pop   STUMP REVISION Right  06/20/2022   Procedure: REVISION RIGHT BELOW KNEE AMPUTATION;  Surgeon: Newt Minion, MD;  Location: [place];  Service: Orthopedics;  Laterality: Right;   TONSILLECTOMY     VASECTOMY     Patient Active Problem List   Diagnosis Date Noted   Elevated coronary artery calcium score 08/06/2022   Dehiscence of amputation stump of right lower extremity ([place]) 06/20/2022   Acute blood loss anemia 05/19/2022   Post-op pain 05/19/2022   Acute renal failure ([place]) 05/19/2022   S/P BKA (below knee amputation), right ([place]) 05/14/2022   Below-knee amputation of right lower extremity ([place]) 05/09/2022   Gangrene of right foot ([facility])    PAD (peripheral artery disease) ([place]) 04/17/2022   Acute osteomyelitis of metatarsal bone of right foot ([facility])    Atherosclerosis of native arteries of the extremities with ulceration ([place]) 04/15/2022   Peripheral arterial disease ([place]) 03/25/2022   Obesity 03/21/2022   Prediabetes 03/21/2022   Erectile dysfunction 07/26/2021   Family history of ischemic heart disease (IHD) 07/26/2021   Pure hypercholesterolemia 07/26/2021   History of adenomatous polyp of colon 02/03/2019   Internal hemorrhoids 02/03/2019   Carotid artery stenosis, asymptomatic 09/05/2015   Cellulitis    Hypertension    Hyperlipidemia    Occlusion and stenosis of carotid artery without mention of cerebral infarction 01/01/2012    ONSET DATE: 10/15/2022 prosthesis delivery  REFERRING DIAG: [id number] (ICD-10-CM) - Below-knee amputation of right lower extremity   THERAPY DIAG:  Other abnormalities of gait and mobility  Unsteadiness on feet  Non-pressure chronic ulcer of skin of other sites with other specified severity (H[facility])  Muscle weakness (generalized)  Rationale for Evaluation and Treatment: Rehabilitation  SUBJECTIVE:   SUBJECTIVE STATEMENT:  He saw chris yesterday and had his prosthesis adjusted and says this is helping with the pain, its more annoying than pain in his shin  PERTINENT  HISTORY: carotid artery occlusion, HTN, PVD, BLE vascular sugeries  PAIN:  Are you having pain? No  PRECAUTIONS: None  WEIGHT BEARING RESTRICTIONS: No  FALLS: Has patient fallen in last 6 months? No  LIVING ENVIRONMENT: Lives with: lives with their spouse Lives in: House  single level Home Access: Stairs to enter Home layout: One level Stairs: Yes: External: 3 steps; can reach both Has following equipment at home: Single point cane, Walker - 2 wheeled, Wheelchair (manual), shower chair, and knee scooter  PLOF: Independent, Independent with household mobility without device, and Independent with community mobility without device  circulation issues started in 2019 with worsening 2021,   PATIENT GOALS:   to use prosthesis to travel, golf, climb ladder, go to gym, active in community including coaching,    OBJECTIVE:  COGNITION: Overall cognitive status: Within functional limits for tasks assessed   SENSATION: WFL  POSTURE: flexed trunk  and weight shift left  LOWER EXTREMITY ROM:  ROM P:passive  A:active Right eval Left eval  Hip flexion    Hip extension    Hip abduction    Hip adduction    Hip internal rotation    Hip external rotation    Knee  flexion WFL   Knee extension [facility]   Ankle dorsiflexion    Ankle plantarflexion    Ankle inversion    Ankle eversion     (Blank rows = not tested)  LOWER EXTREMITY MMT:  MMT Right eval Left eval  Hip flexion 5/5 5/5  Hip extension 4/5   Hip abduction 4/5   Hip adduction    Hip internal rotation    Hip external rotation    Knee flexion 4/5   Knee extension 4/5   Ankle dorsiflexion    Ankle plantarflexion    Ankle inversion    Ankle eversion    (Blank rows = not tested)  TRANSFERS: Sit to stand: Modified independence Stand to sit: Modified independence  GAIT: Gait pattern: step to pattern, decreased step length- Left, decreased stance time- Right, decreased hip/knee flexion- Right, Right hip hike, knee  flexed in stance- Right, antalgic, trunk flexed, and abducted- Right Distance walked: 50' Assistive device utilized: Environmental consultant - 2 wheeled and TTA prosthesis Level of assistance: SBA / cues for deviations no balance issues noted with RW Comments: due to wound issues PT recommending RW for BUE support initially  FUNCTIONAL TESTs:  Eval / 10/22/2022:  Merrilee Jansky Balance Scale: 36/56    CURRENT PROSTHETIC WEAR ASSESSMENT: Eval / 10/22/2022: Patient is dependent with: skin check, residual limb care, prosthetic cleaning, ply sock cleaning, correct ply sock adjustment, proper wear schedule/adjustment, and proper weight-bearing schedule/adjustment Donning prosthesis: SBA Doffing prosthesis: Modified independence Prosthetic wear tolerance: 1-1.5 hours, 2x/day, for last 2 days.  He wore prosthesis 1hr 2x on 3/8 and got water blisters.  Stopped wear on 3/9 & 3/10, resumed 3/11 1.5 hrs 2x/day for 2 days.  Prosthetic weight bearing tolerance: 5 minutes Edema: pitting Residual limb condition: draining (serous) wound at distal tibia, 2nd open blister on distal limb, minimal hair growth, cylindrical shape, no warmth or abnormal color changes noted.  Prosthetic description: silicon liner with pin lock suspension, total contact socket with flexible inner liner, 0-ply socks, dynamic response foot K code/activity level with prosthetic use: Level 3    TODAY'S TREATMENT:                                                                                                                             DATE:  11/06/2022: Prosthetic Training with TTA prosthesis:  Walking without assistance in bars with hands hovering, 5 round tips Step ups fwd and lateral 6 inch step leading with prosthesis side for strengthening focus, 1 to 0 UE support PRN. 10 reps fwd and 10 reps lateral Standing hip flexor stretch for Rt side to help with posture during standing activity 30 sec X 3 Sit to stands using UE support to stand and no UE support  so sit with slow eccentric lower X 10 reps  Neuromuscular re-ed Tandem balance 30 sec X 3 with Rt leg behind Sidestepping without assistance 4 round trips at counter top, cues for weight shift onto prosthesis during  stance phase Walking backwards at counter top 3 round trips with UE support PRN  11/04/2022: Prosthetic Training with TTA prosthesis: Pt reported some distal limb "cracking" so altered wear. See subjective.  Limb has distal dry scab. No blisters noted.  PT recommending use of moisturizer only at night and removing in the morning.  If he notices dry skin during the day he can apply the moisturizer at the beginning of his 2-hour break and remove it prior to redonning the prosthesis.  PT instructed patient and wife and signs of sweating in the prosthesis with recommendation to remove and pat dry limb and liner then immediately redonning the prosthesis.  Patient and wife verbalized understanding. Patient continues to wear Vive Wear under - liner shrinker against skin for wound healing.  The socket arrived wearing was too long creating increased pressure around the knee area.  PT cut it off.  PT reviewed need to adjust ply socks during the day as activity can pump fluid out of his residual limb.  Patient verbalized understanding of above  Neuromuscular Re-ed for balance HEP: PT attempted alternate lower extremity kicks but patient's right hip is not strong enough for this activity yet.  1.  Sidestepping right and left along counter with cues on engaging bilateral lower extremities.   2.  Counter RUE and cane LUE -walking forward with equal step length using heel toe as marker and walking backwards with weight shift over the prosthesis in stance  3.  Weight shift over RLE in stance with tightening hip muscles for stabilization tapping left forefoot in lower cabinet  4. Sit to / from stand 18" chair without armrest using upper extremities lightly on seat.  PT demo and verbal cues on technique  to decrease upper extremity support and increased lower extremity utilization.  5.  Picking up washcloth from floor. PT demo & verbal cues on technique with TTA prosthesis and set-up for safety.    10/27/2022 Prosthetic Training with TTA prosthesis: Pt arrived using single point cane. PT adjusted height & ensuring lock ring tight. Pt amb 100' X 2 with supervision.  Wound at distal tibia with invagination, serous drainage & scab present with no signs of infection.  PT recommending wear up to 4 hours 3x/day with check underliner every 2 hours for dampness with switching.  Needs to remove prosthesis & liner using shrinker for 2 hours between wear to allow skin / wound to dry. Pt & significant other verbalized understanding.  PT instructed in adjusting ply socks with too few, too many & correct ply fit.  Pt verbalized better understanding.   10/22/2022:  PATIENT EDUCATION: PATIENT EDUCATED ON FOLLOWING PROSTHETIC CARE: Education details: PT instructed in use of Vivewear under liner with prosthesis wear. When prosthesis is off, Vivewear & tubular compression shrinker.   Skin check, Residual limb care, Prosthetic cleaning, Correct ply sock adjustment, [place]pper donning, Proper wear schedule/adjustment, and Proper weight-bearing schedule/adjustment Prosthetic wear tolerance: 1.5 hours 3x/day, 7 days/week with plans to increase every 5-7 days depending on issues. Person educated: Patient and Spouse Education method: Explanation, Demonstration, Tactile cues, and Verbal cues Education comprehension: verbalized understanding, verbal cues required, tactile cues required, and needs further education  HOME EXERCISE PROGRAM: HEP 11/04/2022 with PT demo & verbal cues. Pt & wife verbalized understanding with Pt return demo.  1.  Sidestepping right and left along counter with cues on engaging bilateral lower extremities. 10' 3 laps.  2.  Counter RUE and cane LUE -walking forward with equal step length  using heel toe  as marker and walking backwards with weight shift over the prosthesis in stance. 10' 3 laps.   3.  Weight shift over RLE in stance with tightening hip muscles for stabilization tapping left forefoot in lower cabinet. 10 reps 2 sets.  4. Sit to / from stand 18" chair without armrest using upper extremities lightly on seat.  PT demo and verbal cues on technique to decrease upper extremity support and increased lower extremity utilization.  10 reps 2 sets.  5.  Picking up washcloth from floor. PT demo & verbal cues on technique with TTA prosthesis and set-up for safety.  10 reps 2 sets.   ASSESSMENT:  CLINICAL IMPRESSION: He appeared to have less pain and improved standing activity tolerance after adjustments were made to his socket by the prosthetist. He showed good tolerance to strength, balance, and gait progressions today in session and he will continue to benefit from skilled PT.   OBJECTIVE IMPAIRMENTS: Abnormal gait, decreased activity tolerance, decreased balance, decreased knowledge of condition, decreased knowledge of use of DME, decreased mobility, difficulty walking, decreased strength, increased edema, prosthetic dependency , and wound on residual limb .   ACTIVITY LIMITATIONS: carrying, lifting, bending, standing, squatting, stairs, transfers, locomotion level, and prosthesis use  PARTICIPATION LIMITATIONS: meal prep, cleaning, driving, community activity, occupation, yard work, and recreational activities.  PERSONAL FACTORS: Fitness, Time since onset of injury/illness/exacerbation, and 3+ comorbidities: see PMH  are also affecting patient's functional outcome.   REHAB POTENTIAL: Good  CLINICAL DECISION MAKING: Evolving/moderate complexity  EVALUATION COMPLEXITY: Moderate   GOALS: Goals reviewed with patient? Yes  SHORT TERM GOALS: Target date: 11/20/2022  Patient donnes prosthesis modified independent & verbalizes proper cleaning. Baseline: SEE OBJECTIVE DATA Goal status:  Ongoing 10/27/2022 2.  Patient tolerates prosthesis >10 hrs total /day without skin issues increasing and no limb pain. Baseline: SEE OBJECTIVE DATA Goal status: Ongoing 10/27/2022  3.  Berg >/= 45/56. Baseline: SEE OBJECTIVE DATA Goal status: Ongoing 10/27/2022  4. Patient ambulates 100' with cane & prosthesis with supervision. Baseline: SEE OBJECTIVE DATA Goal status: Ongoing 10/27/2022  5. Patient negotiates ramps & curbs with cane & prosthesis with minA. Baseline: SEE OBJECTIVE DATA Goal status: Ongoing 10/27/2022  LONG TERM GOALS: Target date: 01/15/2023  Patient demonstrates & verbalized understanding of prosthetic care to enable safe utilization of prosthesis. Baseline: SEE OBJECTIVE DATA Goal status: Ongoing 10/27/2022  Patient tolerates prosthesis wear >90% of awake hours without skin or limb pain issues. Baseline: SEE OBJECTIVE DATA Goal status: Ongoing 10/27/2022  Functional Gait Assessment >/= 19/30 to indicate lower fall risk Baseline: SEE OBJECTIVE DATA Goal status: Ongoing 10/27/2022  Patient ambulates >500' with prosthesis only independently Baseline: SEE OBJECTIVE DATA Goal status: Ongoing 10/27/2022  Patient negotiates ramps, curbs & stairs with single rail with prosthesis only independently. Baseline: SEE OBJECTIVE DATA Goal status: Ongoing 10/27/2022  Patient demonstrates & verbalizes ability to perform yard work & golf with prosthesis only safely. Baseline: SEE OBJECTIVE DATA Goal status: Ongoing 10/27/2022   PLAN:  PT FREQUENCY: 1-2x/week  PT DURATION: 12 weeks  PLANNED INTERVENTIONS: Therapeutic exercises, Therapeutic activity, Neuromuscular re-education, Balance training, Gait training, Patient/Family education, Self Care, Stair training, Prosthetic training, Re-evaluation, and physical performance testing.  PLAN FOR NEXT SESSION:  check HEP, check limb and review prosthetic care issues, therapeutic exercise & balance activities.   Debbe Odea, PT,  DPT 11/06/2022, 3:14 PM

## 2022-11-09 ENCOUNTER — Encounter: Payer: Self-pay | Admitting: Physical Therapy

## 2022-11-10 ENCOUNTER — Ambulatory Visit (INDEPENDENT_AMBULATORY_CARE_PROVIDER_SITE_OTHER): Payer: 59 | Admitting: Physical Therapy

## 2022-11-10 ENCOUNTER — Encounter: Payer: Self-pay | Admitting: Physical Therapy

## 2022-11-10 DIAGNOSIS — R2681 Unsteadiness on feet: Secondary | ICD-10-CM

## 2022-11-10 DIAGNOSIS — R2689 Other abnormalities of gait and mobility: Secondary | ICD-10-CM | POA: Diagnosis not present

## 2022-11-10 DIAGNOSIS — M6281 Muscle weakness (generalized): Secondary | ICD-10-CM

## 2022-11-10 DIAGNOSIS — L98498 Non-pressure chronic ulcer of skin of other sites with other specified severity: Secondary | ICD-10-CM

## 2022-11-10 NOTE — Therapy (Signed)
OUTPATIENT PHYSICAL THERAPY TREATMENT   Patient Name: Clinton [name] MRN: [id number] DOB:08/26/1955, 67 y.o., male Today's Date: 11/10/2022  PCP: Lujean Amel, MD REFERRING PROVIDER: Reesa Chew, PA-C  END OF SESSION:  PT End of Session - 11/10/22 0800     Visit Number 5    Number of Visits 20    Date for PT Re-Evaluation 01/15/23    Authorization Type [employer]    Authorization Time Period 79 PT visits, 10% co-insurance    Authorization - Visit Number 5    Authorization - Number of Visits 20    Progress Note Due on Visit 10    PT Start Time 0800    PT Stop Time 0840    PT Time Calculation (min) 40 min    Activity Tolerance Patient tolerated treatment well    Behavior During Therapy WFL for tasks assessed/performed               Past Medical History:  Diagnosis Date   Carotid artery occlusion    Cellulitis    Erectile dysfunction    Hyperlipidemia    Hypertension    Peripheral vascular disease    Past Surgical History:  Procedure Laterality Date   ABDOMINAL AORTOGRAM W/LOWER EXTREMITY N/A 02/04/2022   Procedure: ABDOMINAL AORTOGRAM W/LOWER EXTREMITY;  Surgeon: Serafina Mitchell, MD;  Location: [facility];  Service: Cardiovascular;  Laterality: N/A;   ABDOMINAL AORTOGRAM W/LOWER EXTREMITY N/A 02/18/2022   Procedure: ABDOMINAL AORTOGRAM W/LOWER EXTREMITY;  Surgeon: Serafina Mitchell, MD;  Location: [place] [facility];  Service: Cardiovascular;  Laterality: N/A;   ABDOMINAL AORTOGRAM W/LOWER EXTREMITY N/A 04/15/2022   Procedure: ABDOMINAL AORTOGRAM W/LOWER EXTREMITY;  Surgeon: Serafina Mitchell, MD;  Location: [place] [facility];  Service: Cardiovascular;  Laterality: N/A;   ABDOMINAL AORTOGRAM W/LOWER EXTREMITY N/A 10/28/2022   Procedure: ABDOMINAL AORTOGRAM W/LOWER EXTREMITY;  Surgeon: Serafina Mitchell, MD;  Location: [place];  Service: Cardiovascular;  Laterality: N/A;   AMPUTATION Right 04/16/2022   Procedure: RIGHT 5TH RAY AMPUTATION;  Surgeon: Newt Minion, MD;  Location: [place];  Service: Orthopedics;  Laterality: Right;   AMPUTATION Right 05/09/2022   Procedure: RIGHT BELOW KNEE AMPUTATION;  Surgeon: Newt Minion, MD;  Location: [place];  Service: Orthopedics;  Laterality: Right;   APPLICATION OF WOUND VAC Right 06/20/2022   Procedure: APPLICATION OF WOUND VAC;  Surgeon: Newt Minion, MD;  Location: [place];  Service: Orthopedics;  Laterality: Right;   COLONOSCOPY     ENDARTERECTOMY Right 09/05/2015   Procedure: ENDARTERECTOMY CAROTID RIGHT;  Surgeon: Elam Dutch, MD;  Location: [facility];  Service: Vascular;  Laterality: Right;   INTRAVASCULAR LITHOTRIPSY Left 10/28/2022   Procedure: INTRAVASCULAR LITHOTRIPSY;  Surgeon: Serafina Mitchell, MD;  Location: [place] [facility];  Service: Cardiovascular;  Laterality: Left;  L peroneal   PATCH ANGIOPLASTY  09/05/2015   Procedure: PATCH ANGIOPLASTY RIGHT CAROTID ARTERY USING HEMASHIELD PLATINUM FINESSE PATCH;  Surgeon: Elam Dutch, MD;  Location: [place];  Service: Vascular;;   PERIPHERAL VASCULAR ATHERECTOMY Left 02/18/2022   Procedure: PERIPHERAL VASCULAR ATHERECTOMY;  Surgeon: Serafina Mitchell, MD;  Location: [place] [facility];  Service: Cardiovascular;  Laterality: Left;  Popliteal and Peroneal   PERIPHERAL VASCULAR BALLOON ANGIOPLASTY  02/04/2022   Procedure: PERIPHERAL VASCULAR BALLOON ANGIOPLASTY;  Surgeon: Serafina Mitchell, MD;  Location: [place] [facility];  Service: Cardiovascular;;   PERIPHERAL VASCULAR BALLOON ANGIOPLASTY Right 04/15/2022   Procedure:  PERIPHERAL VASCULAR BALLOON ANGIOPLASTY;  Surgeon: Serafina Mitchell, MD;  Location: [place] [facility];  Service: Cardiovascular;  Laterality: Right;  SFA/POPLITEAL   PERIPHERAL VASCULAR BALLOON ANGIOPLASTY Left 10/28/2022   Procedure: PERIPHERAL VASCULAR BALLOON ANGIOPLASTY;  Surgeon: Serafina Mitchell, MD;  Location: [place] [facility];  Service: Cardiovascular;  Laterality: Left;  L peroneal and L pop   STUMP REVISION Right 06/20/2022    Procedure: REVISION RIGHT BELOW KNEE AMPUTATION;  Surgeon: Newt Minion, MD;  Location: [place];  Service: Orthopedics;  Laterality: Right;   TONSILLECTOMY     VASECTOMY     Patient Active Problem List   Diagnosis Date Noted   Elevated coronary artery calcium score 08/06/2022   Dehiscence of amputation stump of right lower extremity 06/20/2022   Acute blood loss anemia 05/19/2022   Post-op pain 05/19/2022   Acute renal failure 05/19/2022   S/P BKA (below knee amputation), right 05/14/2022   Below-knee amputation of right lower extremity 05/09/2022   Gangrene of right foot    PAD (peripheral artery disease) 04/17/2022   Acute osteomyelitis of metatarsal bone of right foot    Atherosclerosis of native arteries of the extremities with ulceration 04/15/2022   Peripheral arterial disease 03/25/2022   Obesity 03/21/2022   Prediabetes 03/21/2022   Erectile dysfunction 07/26/2021   Family history of ischemic heart disease (IHD) 07/26/2021   Pure hypercholesterolemia 07/26/2021   History of adenomatous polyp of colon 02/03/2019   Internal hemorrhoids 02/03/2019   Carotid artery stenosis, asymptomatic 09/05/2015   Cellulitis    Hypertension    Hyperlipidemia    Occlusion and stenosis of carotid artery without mention of cerebral infarction 01/01/2012    ONSET DATE: 10/15/2022 prosthesis delivery  REFERRING DIAG: [id number] (ICD-10-CM) - Below-knee amputation of right lower extremity   THERAPY DIAG:  Other abnormalities of gait and mobility  Unsteadiness on feet  Non-pressure chronic ulcer of skin of other sites with other specified severity  Muscle weakness (generalized)  Rationale for Evaluation and Treatment: Rehabilitation  SUBJECTIVE:   SUBJECTIVE STATEMENT:  He is walking in home without device.  He was able to sit on stool to garden some.  He is wearing prosthesis 4-6 hours 2x/day with off 2-3 hours bw.  PERTINENT HISTORY: carotid artery occlusion, HTN, PVD, BLE vascular  sugeries  PAIN:  Are you having pain? No  PRECAUTIONS: None  WEIGHT BEARING RESTRICTIONS: No  FALLS: Has patient fallen in last 6 months? No  LIVING ENVIRONMENT: Lives with: lives with their spouse Lives in: House  single level Home Access: Stairs to enter Home layout: One level Stairs: Yes: External: 3 steps; can reach both Has following equipment at home: Single point cane, Walker - 2 wheeled, Wheelchair (manual), shower chair, and knee scooter  PLOF: Independent, Independent with household mobility without device, and Independent with community mobility without device  circulation issues started in 2019 with worsening 2021,   PATIENT GOALS:   to use prosthesis to travel, golf, climb ladder, go to gym, active in community including coaching,    OBJECTIVE:  COGNITION: Overall cognitive status: Within functional limits for tasks assessed   SENSATION: WFL  POSTURE: flexed trunk  and weight shift left  LOWER EXTREMITY ROM:  ROM P:passive  A:active Right eval Left eval  Hip flexion    Hip extension    Hip abduction    Hip adduction    Hip internal rotation    Hip external rotation    Knee flexion [facility]  Knee extension [facility]   Ankle dorsiflexion    Ankle plantarflexion    Ankle inversion    Ankle eversion     (Blank rows = not tested)  LOWER EXTREMITY MMT:  MMT Right eval Left eval  Hip flexion 5/5 5/5  Hip extension 4/5   Hip abduction 4/5   Hip adduction    Hip internal rotation    Hip external rotation    Knee flexion 4/5   Knee extension 4/5   Ankle dorsiflexion    Ankle plantarflexion    Ankle inversion    Ankle eversion    (Blank rows = not tested)  TRANSFERS: Sit to stand: Modified independence Stand to sit: Modified independence  GAIT: Gait pattern: step to pattern, decreased step length- Left, decreased stance time- Right, decreased hip/knee flexion- Right, Right hip hike, knee flexed in stance- Right, antalgic, trunk flexed, and abducted-  Right Distance walked: 50' Assistive device utilized: Environmental consultant - 2 wheeled and TTA prosthesis Level of assistance: SBA / cues for deviations no balance issues noted with RW Comments: due to wound issues PT recommending RW for BUE support initially  FUNCTIONAL TESTs:  Eval / 10/22/2022:  Merrilee Jansky Balance Scale: 36/56    CURRENT PROSTHETIC WEAR ASSESSMENT: Eval / 10/22/2022: Patient is dependent with: skin check, residual limb care, prosthetic cleaning, ply sock cleaning, correct ply sock adjustment, proper wear schedule/adjustment, and proper weight-bearing schedule/adjustment Donning prosthesis: SBA Doffing prosthesis: Modified independence Prosthetic wear tolerance: 1-1.5 hours, 2x/day, for last 2 days.  He wore prosthesis 1hr 2x on 3/8 and got water blisters.  Stopped wear on 3/9 & 3/10, resumed 3/11 1.5 hrs 2x/day for 2 days.  Prosthetic weight bearing tolerance: 5 minutes Edema: pitting Residual limb condition: draining (serous) wound at distal tibia, 2nd open blister on distal limb, minimal hair growth, cylindrical shape, no warmth or abnormal color changes noted.  Prosthetic description: silicon liner with pin lock suspension, total contact socket with flexible inner liner, 0-ply socks, dynamic response foot K code/activity level with prosthetic use: Level 3    TODAY'S TREATMENT:                                                                                                                             DATE:  11/10/2022: Prosthetic Training with TTA prosthesis: Wound continues to have scab with no signs of infection but one white spot that could be underlying suture trying to expel.  PT recommended wear 6 hours 2x/day with drying or checking under liner for dampness half way of each 6 hour wear. Pt verbalized understanding. Pt amb 50' X 3 without device safely with slight decrease stance time on prosthesis.  PT demo & verbal cues on lifting items from floor.  Initially timer (no wt) safely.  Then 10# kettle bell with BUEs 3 reps.   Pt amb 25' with 10# kettle bell RUE & 25' in LUE with supervision.  PT recommended walking in home & office without  AD unless he notices limb tenderness. Will need to build frequency of short distances on level surfaces. Pt verbalized understanding.  PT demo & verbal cues on floor transfer using chair to push. Pt performed safely. PT recommended 1-2 times per day to build stronger & learn technique better. Pt verbalized understanding.  PT demo & verbal on push / pull motion with offset feet and weight shift between feet. Pt performed with 5# on cane to simulate "weed eating" with only minor weight shift onto prosthesis.    11/06/2022: Prosthetic Training with TTA prosthesis:  Walking without assistance in bars with hands hovering, 5 round tips Step ups fwd and lateral 6 inch step leading with prosthesis side for strengthening focus, 1 to 0 UE support PRN. 10 reps fwd and 10 reps lateral Standing hip flexor stretch for Rt side to help with posture during standing activity 30 sec X 3 Sit to stands using UE support to stand and no UE support so sit with slow eccentric lower X 10 reps  Neuromuscular re-ed Tandem balance 30 sec X 3 with Rt leg behind Sidestepping without assistance 4 round trips at counter top, cues for weight shift onto prosthesis during stance phase Walking backwards at counter top 3 round trips with UE support PRN  11/04/2022: Prosthetic Training with TTA prosthesis: Pt reported some distal limb "cracking" so altered wear. See subjective.  Limb has distal dry scab. No blisters noted.  PT recommending use of moisturizer only at night and removing in the morning.  If he notices dry skin during the day he can apply the moisturizer at the beginning of his 2-hour break and remove it prior to redonning the prosthesis.  PT instructed patient and wife and signs of sweating in the prosthesis with recommendation to remove and pat dry limb and liner  then immediately redonning the prosthesis.  Patient and wife verbalized understanding. Patient continues to wear Vive Wear under - liner shrinker against skin for wound healing.  The socket arrived wearing was too long creating increased pressure around the knee area.  PT cut it off.  PT reviewed need to adjust ply socks during the day as activity can pump fluid out of his residual limb.  Patient verbalized understanding of above  Neuromuscular Re-ed for balance HEP: PT attempted alternate lower extremity kicks but patient's right hip is not strong enough for this activity yet.  1.  Sidestepping right and left along counter with cues on engaging bilateral lower extremities.   2.  Counter RUE and cane LUE -walking forward with equal step length using heel toe as marker and walking backwards with weight shift over the prosthesis in stance  3.  Weight shift over RLE in stance with tightening hip muscles for stabilization tapping left forefoot in lower cabinet  4. Sit to / from stand 18" chair without armrest using upper extremities lightly on seat.  PT demo and verbal cues on technique to decrease upper extremity support and increased lower extremity utilization.  5.  Picking up washcloth from floor. PT demo & verbal cues on technique with TTA prosthesis and set-up for safety.     PATIENT EDUCATION: PATIENT EDUCATED ON FOLLOWING PROSTHETIC CARE: Education details: PT instructed in use of Vivewear under liner with prosthesis wear. When prosthesis is off, Vivewear & tubular compression shrinker.   Skin check, Residual limb care, Prosthetic cleaning, Correct ply sock adjustment, Propper donning, Proper wear schedule/adjustment, and Proper weight-bearing schedule/adjustment Prosthetic wear tolerance: 1.5 hours 3x/day, 7 days/week with plans  to increase every 5-7 days depending on issues. Person educated: Patient and Spouse Education method: Explanation, Demonstration, Tactile cues, and Verbal  cues Education comprehension: verbalized understanding, verbal cues required, tactile cues required, and needs further education  HOME EXERCISE PROGRAM: HEP 11/04/2022 with PT demo & verbal cues. Pt & wife verbalized understanding with Pt return demo.  1.  Sidestepping right and left along counter with cues on engaging bilateral lower extremities. 10' 3 laps.  2.  Counter RUE and cane LUE -walking forward with equal step length using heel toe as marker and walking backwards with weight shift over the prosthesis in stance. 10' 3 laps.   3.  Weight shift over RLE in stance with tightening hip muscles for stabilization tapping left forefoot in lower cabinet. 10 reps 2 sets.  4. Sit to / from stand 18" chair without armrest using upper extremities lightly on seat.  PT demo and verbal cues on technique to decrease upper extremity support and increased lower extremity utilization.  10 reps 2 sets.  5.  Picking up washcloth from floor. PT demo & verbal cues on technique with TTA prosthesis and set-up for safety.  10 reps 2 sets.   ASSESSMENT:  CLINICAL IMPRESSION: Patient is tolerating increased prosthesis wear & use.  He appears to understand lifting, carrying and floor transfers.  He showed good tolerance to strength, balance, and gait progressions today in session and he will continue to benefit from skilled PT.   OBJECTIVE IMPAIRMENTS: Abnormal gait, decreased activity tolerance, decreased balance, decreased knowledge of condition, decreased knowledge of use of DME, decreased mobility, difficulty walking, decreased strength, increased edema, prosthetic dependency , and wound on residual limb .   ACTIVITY LIMITATIONS: carrying, lifting, bending, standing, squatting, stairs, transfers, locomotion level, and prosthesis use  PARTICIPATION LIMITATIONS: meal prep, cleaning, driving, community activity, occupation, yard work, and recreational activities.  PERSONAL FACTORS: Fitness, Time since onset of  injury/illness/exacerbation, and 3+ comorbidities: see PMH  are also affecting patient's functional outcome.   REHAB POTENTIAL: Good  CLINICAL DECISION MAKING: Evolving/moderate complexity  EVALUATION COMPLEXITY: Moderate   GOALS: Goals reviewed with patient? Yes  SHORT TERM GOALS: Target date: 11/20/2022  Patient donnes prosthesis modified independent & verbalizes proper cleaning. Baseline: SEE OBJECTIVE DATA Goal status: Ongoing 10/27/2022 2.  Patient tolerates prosthesis >10 hrs total /day without skin issues increasing and no limb pain. Baseline: SEE OBJECTIVE DATA Goal status: Ongoing 10/27/2022  3.  Berg >/= 45/56. Baseline: SEE OBJECTIVE DATA Goal status: Ongoing 10/27/2022  4. Patient ambulates 100' with cane & prosthesis with supervision. Baseline: SEE OBJECTIVE DATA Goal status: Ongoing 10/27/2022  5. Patient negotiates ramps & curbs with cane & prosthesis with minA. Baseline: SEE OBJECTIVE DATA Goal status: Ongoing 10/27/2022  LONG TERM GOALS: Target date: 01/15/2023  Patient demonstrates & verbalized understanding of prosthetic care to enable safe utilization of prosthesis. Baseline: SEE OBJECTIVE DATA Goal status: Ongoing 10/27/2022  Patient tolerates prosthesis wear >90% of awake hours without skin or limb pain issues. Baseline: SEE OBJECTIVE DATA Goal status: Ongoing 10/27/2022  Functional Gait Assessment >/= 19/30 to indicate lower fall risk Baseline: SEE OBJECTIVE DATA Goal status: Ongoing 10/27/2022  Patient ambulates >500' with prosthesis only independently Baseline: SEE OBJECTIVE DATA Goal status: Ongoing 10/27/2022  Patient negotiates ramps, curbs & stairs with single rail with prosthesis only independently. Baseline: SEE OBJECTIVE DATA Goal status: Ongoing 10/27/2022  Patient demonstrates & verbalizes ability to perform yard work & golf with prosthesis only safely. Baseline: SEE OBJECTIVE DATA Goal status:  Ongoing 10/27/2022   PLAN:  PT FREQUENCY:  1-2x/week  PT DURATION: 12 weeks  PLANNED INTERVENTIONS: Therapeutic exercises, Therapeutic activity, Neuromuscular re-education, Balance training, Gait training, Patient/Family education, Self Care, Stair training, Prosthetic training, Re-evaluation, and physical performance testing.  PLAN FOR NEXT SESSION:  continue check limb and review prosthetic care issues, therapeutic exercise & balance activities.   Jamey Reas, PT, DPT 11/10/2022, 9:02 AM

## 2022-11-12 ENCOUNTER — Ambulatory Visit (INDEPENDENT_AMBULATORY_CARE_PROVIDER_SITE_OTHER): Payer: 59 | Admitting: Physical Therapy

## 2022-11-12 ENCOUNTER — Encounter: Payer: Self-pay | Admitting: Physical Therapy

## 2022-11-12 DIAGNOSIS — M6281 Muscle weakness (generalized): Secondary | ICD-10-CM | POA: Diagnosis not present

## 2022-11-12 DIAGNOSIS — L98498 Non-pressure chronic ulcer of skin of other sites with other specified severity: Secondary | ICD-10-CM | POA: Diagnosis not present

## 2022-11-12 DIAGNOSIS — R2689 Other abnormalities of gait and mobility: Secondary | ICD-10-CM | POA: Diagnosis not present

## 2022-11-12 DIAGNOSIS — R2681 Unsteadiness on feet: Secondary | ICD-10-CM | POA: Diagnosis not present

## 2022-11-12 NOTE — Therapy (Signed)
OUTPATIENT PHYSICAL THERAPY TREATMENT   Patient Name: Clinton [name] MRN: [id number] DOB:July 12, 1956, 67 y.o., male Today's Date: 11/12/2022  PCP: Lujean Amel, MD REFERRING PROVIDER: Reesa Chew, PA-C  END OF SESSION:  PT End of Session - 11/12/22 0758     Visit Number 6    Number of Visits 20    Date for PT Re-Evaluation 01/15/23    Authorization Type [employer]    Authorization Time Period 63 PT visits, 10% co-insurance    Authorization - Visit Number 6    Authorization - Number of Visits 20    Progress Note Due on Visit 10    PT Start Time 0800    PT Stop Time 0844    PT Time Calculation (min) 44 min    Activity Tolerance Patient tolerated treatment well    Behavior During Therapy [facility] for tasks assessed/performed                Past Medical History:  Diagnosis Date   Carotid artery occlusion    Cellulitis    Erectile dysfunction    Hyperlipidemia    Hypertension    Peripheral vascular disease    Past Surgical History:  Procedure Laterality Date   ABDOMINAL AORTOGRAM W/LOWER EXTREMITY N/A 02/04/2022   Procedure: ABDOMINAL AORTOGRAM W/LOWER EXTREMITY;  Surgeon: Serafina Mitchell, MD;  Location: [place] [facility];  Service: Cardiovascular;  Laterality: N/A;   ABDOMINAL AORTOGRAM W/LOWER EXTREMITY N/A 02/18/2022   Procedure: ABDOMINAL AORTOGRAM W/LOWER EXTREMITY;  Surgeon: Serafina Mitchell, MD;  Location: [place] [facility];  Service: Cardiovascular;  Laterality: N/A;   ABDOMINAL AORTOGRAM W/LOWER EXTREMITY N/A 04/15/2022   Procedure: ABDOMINAL AORTOGRAM W/LOWER EXTREMITY;  Surgeon: Serafina Mitchell, MD;  Location: [place] [facility];  Service: Cardiovascular;  Laterality: N/A;   ABDOMINAL AORTOGRAM W/LOWER EXTREMITY N/A 10/28/2022   Procedure: ABDOMINAL AORTOGRAM W/LOWER EXTREMITY;  Surgeon: Serafina Mitchell, MD;  Location: [place] [facility];  Service: Cardiovascular;  Laterality: N/A;   AMPUTATION Right 04/16/2022   Procedure: RIGHT 5TH RAY AMPUTATION;  Surgeon:  Newt Minion, MD;  Location: [place];  Service: Orthopedics;  Laterality: Right;   AMPUTATION Right 05/09/2022   Procedure: RIGHT BELOW KNEE AMPUTATION;  Surgeon: Newt Minion, MD;  Location: [place];  Service: Orthopedics;  Laterality: Right;   APPLICATION OF WOUND VAC Right 06/20/2022   Procedure: APPLICATION OF WOUND VAC;  Surgeon: Newt Minion, MD;  Location: [place];  Service: Orthopedics;  Laterality: Right;   COLONOSCOPY     ENDARTERECTOMY Right 09/05/2015   Procedure: ENDARTERECTOMY CAROTID RIGHT;  Surgeon: Elam Dutch, MD;  Location: [facility];  Service: Vascular;  Laterality: Right;   PATCH ANGIOPLASTY  09/05/2015   Procedure: PATCH ANGIOPLASTY RIGHT CAROTID ARTERY USING HEMASHIELD PLATINUM FINESSE PATCH;  Surgeon: Elam Dutch, MD;  Location: [place];  Service: Vascular;;   PERIPHERAL INTRAVASCULAR LITHOTRIPSY Left 10/28/2022   Procedure: INTRAVASCULAR LITHOTRIPSY;  Surgeon: Serafina Mitchell, MD;  Location: [place] [facility];  Service: Cardiovascular;  Laterality: Left;  L peroneal   PERIPHERAL VASCULAR ATHERECTOMY Left 02/18/2022   Procedure: PERIPHERAL VASCULAR ATHERECTOMY;  Surgeon: Serafina Mitchell, MD;  Location: [place] [facility];  Service: Cardiovascular;  Laterality: Left;  Popliteal and Peroneal   PERIPHERAL VASCULAR BALLOON ANGIOPLASTY  02/04/2022   Procedure: PERIPHERAL VASCULAR BALLOON ANGIOPLASTY;  Surgeon: Serafina Mitchell, MD;  Location: [place] [facility];  Service: Cardiovascular;;   PERIPHERAL VASCULAR BALLOON ANGIOPLASTY Right 04/15/2022  Procedure: PERIPHERAL VASCULAR BALLOON ANGIOPLASTY;  Surgeon: Serafina Mitchell, MD;  Location: [place] [facility];  Service: Cardiovascular;  Laterality: Right;  SFA/POPLITEAL   PERIPHERAL VASCULAR BALLOON ANGIOPLASTY Left 10/28/2022   Procedure: PERIPHERAL VASCULAR BALLOON ANGIOPLASTY;  Surgeon: Serafina Mitchell, MD;  Location: [place] [facility];  Service: Cardiovascular;  Laterality: Left;  L peroneal and L pop   STUMP REVISION  Right 06/20/2022   Procedure: REVISION RIGHT BELOW KNEE AMPUTATION;  Surgeon: Newt Minion, MD;  Location: [place];  Service: Orthopedics;  Laterality: Right;   TONSILLECTOMY     VASECTOMY     Patient Active Problem List   Diagnosis Date Noted   Elevated coronary artery calcium score 08/06/2022   Dehiscence of amputation stump of right lower extremity 06/20/2022   Acute blood loss anemia 05/19/2022   Post-op pain 05/19/2022   Acute renal failure 05/19/2022   S/P BKA (below knee amputation), right 05/14/2022   Below-knee amputation of right lower extremity 05/09/2022   Gangrene of right foot    PAD (peripheral artery disease) 04/17/2022   Acute osteomyelitis of metatarsal bone of right foot    Atherosclerosis of native arteries of the extremities with ulceration 04/15/2022   Peripheral arterial disease 03/25/2022   Obesity 03/21/2022   Prediabetes 03/21/2022   Erectile dysfunction 07/26/2021   Family history of ischemic heart disease (IHD) 07/26/2021   Pure hypercholesterolemia 07/26/2021   History of adenomatous polyp of colon 02/03/2019   Internal hemorrhoids 02/03/2019   Carotid artery stenosis, asymptomatic 09/05/2015   Cellulitis    Hypertension    Hyperlipidemia    Occlusion and stenosis of carotid artery without mention of cerebral infarction 01/01/2012    ONSET DATE: 10/15/2022 prosthesis delivery  REFERRING DIAG: [id number] (ICD-10-CM) - Below-knee amputation of right lower extremity   THERAPY DIAG:  Other abnormalities of gait and mobility  Unsteadiness on feet  Non-pressure chronic ulcer of skin of other sites with other specified severity  Muscle weakness (generalized)  Rationale for Evaluation and Treatment: Rehabilitation  SUBJECTIVE:   SUBJECTIVE STATEMENT:  When he walks without cane in office & home, it does not seem smooth.  He is managing ply socks.  His limb was tender ~630 after work prior to 2nd 6 hour.    PERTINENT HISTORY: carotid artery  occlusion, HTN, PVD, BLE vascular sugeries  PAIN:  Are you having pain? No  PRECAUTIONS: None  WEIGHT BEARING RESTRICTIONS: No  FALLS: Has patient fallen in last 6 months? No  LIVING ENVIRONMENT: Lives with: lives with their spouse Lives in: House  single level Home Access: Stairs to enter Home layout: One level Stairs: Yes: External: 3 steps; can reach both Has following equipment at home: Single point cane, Walker - 2 wheeled, Wheelchair (manual), shower chair, and knee scooter  PLOF: Independent, Independent with household mobility without device, and Independent with community mobility without device  circulation issues started in 2019 with worsening 2021,   PATIENT GOALS:   to use prosthesis to travel, golf, climb ladder, go to gym, active in community including coaching,    OBJECTIVE:  COGNITION: Overall cognitive status: Within functional limits for tasks assessed   SENSATION: WFL  POSTURE: flexed trunk  and weight shift left  LOWER EXTREMITY ROM:  ROM P:passive  A:active Right eval Left eval  Hip flexion    Hip extension    Hip abduction    Hip adduction    Hip internal rotation    Hip external rotation  Knee flexion WFL   Knee extension [facility]   Ankle dorsiflexion    Ankle plantarflexion    Ankle inversion    Ankle eversion     (Blank rows = not tested)  LOWER EXTREMITY MMT:  MMT Right eval Left eval  Hip flexion 5/5 5/5  Hip extension 4/5   Hip abduction 4/5   Hip adduction    Hip internal rotation    Hip external rotation    Knee flexion 4/5   Knee extension 4/5   Ankle dorsiflexion    Ankle plantarflexion    Ankle inversion    Ankle eversion    (Blank rows = not tested)  TRANSFERS: Sit to stand: Modified independence Stand to sit: Modified independence  GAIT: Gait pattern: step to pattern, decreased step length- Left, decreased stance time- Right, decreased hip/knee flexion- Right, Right hip hike, knee flexed in stance- Right,  antalgic, trunk flexed, and abducted- Right Distance walked: 50' Assistive device utilized: Environmental consultant - 2 wheeled and TTA prosthesis Level of assistance: SBA / cues for deviations no balance issues noted with RW Comments: due to wound issues PT recommending RW for BUE support initially  FUNCTIONAL TESTs:  Eval / 10/22/2022:  Merrilee Jansky Balance Scale: 36/56    CURRENT PROSTHETIC WEAR ASSESSMENT: Eval / 10/22/2022: Patient is dependent with: skin check, residual limb care, prosthetic cleaning, ply sock cleaning, correct ply sock adjustment, proper wear schedule/adjustment, and proper weight-bearing schedule/adjustment Donning prosthesis: SBA Doffing prosthesis: Modified independence Prosthetic wear tolerance: 1-1.5 hours, 2x/day, for last 2 days.  He wore prosthesis 1hr 2x on 3/8 and got water blisters.  Stopped wear on 3/9 & 3/10, resumed 3/11 1.5 hrs 2x/day for 2 days.  Prosthetic weight bearing tolerance: 5 minutes Edema: pitting Residual limb condition: draining (serous) wound at distal tibia, 2nd open blister on distal limb, minimal hair growth, cylindrical shape, no warmth or abnormal color changes noted.  Prosthetic description: silicon liner with pin lock suspension, total contact socket with flexible inner liner, 0-ply socks, dynamic response foot K code/activity level with prosthetic use: Level 3    TODAY'S TREATMENT:                                                                                                                             DATE:  11/12/2022: Prosthetic Training with TTA prosthesis: PT recommended continue to attempt wear 6hrs 2x/day as able to tolerate related to limb pain & monitor wound.  PT recommending appt with Gerald Stabs prosthetist for alignment check when amb without device and check if shrinkers are correct size for limb volume.  Do not add pads until he is wearing 3 or more ply socks at all times. PT also recommended creating packing list for travel. Pt verbalized  understanding.  PT used line on floor for visual reference, verbal cues & proprioception for proper step width and LLE heel rise in terminal stance to facilitate weight over prosthesis medial-lateral and ant-post. Pt amb with cane  300' X 2 and 18' without device.   Therapeutic Exercise: PT recommending initiating cardio exercise with seated equipment. PT instructed in technique & set-up of bike with TTA prosthesis.  Pt rode recumbent bike seat 8 level 1 for 5 min. PT demo & verbal cues on UE weight machines with pelvis level. Pt verbalized understanding.    11/10/2022: Prosthetic Training with TTA prosthesis: Wound continues to have scab with no signs of infection but one white spot that could be underlying suture trying to expel.  PT recommended wear 6 hours 2x/day with drying or checking under liner for dampness half way of each 6 hour wear. Pt verbalized understanding. Pt amb 50' X 3 without device safely with slight decrease stance time on prosthesis.  PT demo & verbal cues on lifting items from floor.  Initially timer (no wt) safely. Then 10# kettle bell with BUEs 3 reps.   Pt amb 25' with 10# kettle bell RUE & 25' in LUE with supervision.  PT recommended walking in home & office without AD unless he notices limb tenderness. Will need to build frequency of short distances on level surfaces. Pt verbalized understanding.  PT demo & verbal cues on floor transfer using chair to push. Pt performed safely. PT recommended 1-2 times per day to build stronger & learn technique better. Pt verbalized understanding.  PT demo & verbal on push / pull motion with offset feet and weight shift between feet. Pt performed with 5# on cane to simulate "weed eating" with only minor weight shift onto prosthesis.    11/06/2022: Prosthetic Training with TTA prosthesis:  Walking without assistance in bars with hands hovering, 5 round tips Step ups fwd and lateral 6 inch step leading with prosthesis side for  strengthening focus, 1 to 0 UE support PRN. 10 reps fwd and 10 reps lateral Standing hip flexor stretch for Rt side to help with posture during standing activity 30 sec X 3 Sit to stands using UE support to stand and no UE support so sit with slow eccentric lower X 10 reps  Neuromuscular re-ed Tandem balance 30 sec X 3 with Rt leg behind Sidestepping without assistance 4 round trips at counter top, cues for weight shift onto prosthesis during stance phase Walking backwards at counter top 3 round trips with UE support PRN     PATIENT EDUCATION: PATIENT EDUCATED ON FOLLOWING PROSTHETIC CARE: Education details: PT instructed in use of Vivewear under liner with prosthesis wear. When prosthesis is off, Vivewear & tubular compression shrinker.   Skin check, Residual limb care, Prosthetic cleaning, Correct ply sock adjustment, Propper donning, Proper wear schedule/adjustment, and Proper weight-bearing schedule/adjustment Prosthetic wear tolerance: 1.5 hours 3x/day, 7 days/week with plans to increase every 5-7 days depending on issues. Person educated: Patient and Spouse Education method: Explanation, Demonstration, Tactile cues, and Verbal cues Education comprehension: verbalized understanding, verbal cues required, tactile cues required, and needs further education  HOME EXERCISE PROGRAM: HEP 11/04/2022 with PT demo & verbal cues. Pt & wife verbalized understanding with Pt return demo.  1.  Sidestepping right and left along counter with cues on engaging bilateral lower extremities. 10' 3 laps.  2.  Counter RUE and cane LUE -walking forward with equal step length using heel toe as marker and walking backwards with weight shift over the prosthesis in stance. 10' 3 laps.   3.  Weight shift over RLE in stance with tightening hip muscles for stabilization tapping left forefoot in lower cabinet. 10 reps 2 sets.  4.  Sit to / from stand 18" chair without armrest using upper extremities lightly on seat.  PT  demo and verbal cues on technique to decrease upper extremity support and increased lower extremity utilization.  10 reps 2 sets.  5.  Picking up washcloth from floor. PT demo & verbal cues on technique with TTA prosthesis and set-up for safety.  10 reps 2 sets.   ASSESSMENT:  CLINICAL IMPRESSION: Patient improved step width and weight shift directly over prosthesis in stance. He reported less discomfort.  He appears to understand how to use stationary bike with prosthesis. He  continues to benefit from skilled PT.   OBJECTIVE IMPAIRMENTS: Abnormal gait, decreased activity tolerance, decreased balance, decreased knowledge of condition, decreased knowledge of use of DME, decreased mobility, difficulty walking, decreased strength, increased edema, prosthetic dependency , and wound on residual limb .   ACTIVITY LIMITATIONS: carrying, lifting, bending, standing, squatting, stairs, transfers, locomotion level, and prosthesis use  PARTICIPATION LIMITATIONS: meal prep, cleaning, driving, community activity, occupation, yard work, and recreational activities.  PERSONAL FACTORS: Fitness, Time since onset of injury/illness/exacerbation, and 3+ comorbidities: see PMH  are also affecting patient's functional outcome.   REHAB POTENTIAL: Good  CLINICAL DECISION MAKING: Evolving/moderate complexity  EVALUATION COMPLEXITY: Moderate   GOALS: Goals reviewed with patient? Yes  SHORT TERM GOALS: Target date: 11/20/2022  Patient donnes prosthesis modified independent & verbalizes proper cleaning. Baseline: SEE OBJECTIVE DATA Goal status: Ongoing 10/27/2022 2.  Patient tolerates prosthesis >10 hrs total /day without skin issues increasing and no limb pain. Baseline: SEE OBJECTIVE DATA Goal status: Ongoing 10/27/2022  3.  Berg >/= 45/56. Baseline: SEE OBJECTIVE DATA Goal status: Ongoing 10/27/2022  4. Patient ambulates 100' with cane & prosthesis with supervision. Baseline: SEE OBJECTIVE DATA Goal  status: Ongoing 10/27/2022  5. Patient negotiates ramps & curbs with cane & prosthesis with minA. Baseline: SEE OBJECTIVE DATA Goal status: Ongoing 10/27/2022  LONG TERM GOALS: Target date: 01/15/2023  Patient demonstrates & verbalized understanding of prosthetic care to enable safe utilization of prosthesis. Baseline: SEE OBJECTIVE DATA Goal status: Ongoing 10/27/2022  Patient tolerates prosthesis wear >90% of awake hours without skin or limb pain issues. Baseline: SEE OBJECTIVE DATA Goal status: Ongoing 10/27/2022  Functional Gait Assessment >/= 19/30 to indicate lower fall risk Baseline: SEE OBJECTIVE DATA Goal status: Ongoing 10/27/2022  Patient ambulates >500' with prosthesis only independently Baseline: SEE OBJECTIVE DATA Goal status: Ongoing 10/27/2022  Patient negotiates ramps, curbs & stairs with single rail with prosthesis only independently. Baseline: SEE OBJECTIVE DATA Goal status: Ongoing 10/27/2022  Patient demonstrates & verbalizes ability to perform yard work & golf with prosthesis only safely. Baseline: SEE OBJECTIVE DATA Goal status: Ongoing 10/27/2022   PLAN:  PT FREQUENCY: 1-2x/week  PT DURATION: 12 weeks  PLANNED INTERVENTIONS: Therapeutic exercises, Therapeutic activity, Neuromuscular re-education, Balance training, Gait training, Patient/Family education, Self Care, Stair training, Prosthetic training, Re-evaluation, and physical performance testing.  PLAN FOR NEXT SESSION:  check STGs next week,  continue check limb and review prosthetic care issues, therapeutic exercise & balance activities.   Jamey Reas, PT, DPT 11/12/2022, 1:30 PM

## 2022-11-13 ENCOUNTER — Ambulatory Visit (INDEPENDENT_AMBULATORY_CARE_PROVIDER_SITE_OTHER): Payer: 59 | Admitting: Orthopedic Surgery

## 2022-11-13 ENCOUNTER — Other Ambulatory Visit: Payer: Self-pay | Admitting: *Deleted

## 2022-11-13 DIAGNOSIS — I7025 Atherosclerosis of native arteries of other extremities with ulceration: Secondary | ICD-10-CM

## 2022-11-13 DIAGNOSIS — Z89511 Acquired absence of right leg below knee: Secondary | ICD-10-CM | POA: Diagnosis not present

## 2022-11-13 DIAGNOSIS — S88111A Complete traumatic amputation at level between knee and ankle, right lower leg, initial encounter: Secondary | ICD-10-CM

## 2022-11-13 DIAGNOSIS — I739 Peripheral vascular disease, unspecified: Secondary | ICD-10-CM

## 2022-11-14 ENCOUNTER — Encounter: Payer: Self-pay | Admitting: Orthopedic Surgery

## 2022-11-14 NOTE — Progress Notes (Signed)
Office Visit Note   Patient: Clinton [name]           Date of Birth: [birth year]           MRN: [id number] Visit Date: 11/13/2022              Requested by: Darrow BussingKoirala, Dibas, MD [address] [place],  [place] [id number] PCP: Darrow BussingKoirala, Dibas, MD  Chief Complaint  Patient presents with   Right Leg - Follow-up    Hx right bka   Left Foot - Follow-up      HPI: Patient is a 67 year old gentleman who is status post right transtibial amputation.  Patient is currently wearing his prosthetic leg and is driving.  He has a small ulcer over the residual limb.  Patient is status post endovascular treatment for the left lower extremity March 19.  Assessment & Plan: Visit Diagnoses:  1. Below-knee amputation of right lower extremity, initial encounter     Plan: Recommended a under liner for the right transtibial amputation.  Recommended a size extra-large sock for the left lower extremity.  Follow-Up Instructions: Return in about 4 weeks (around 12/11/2022).   Ortho Exam  Patient is alert, oriented, no adenopathy, well-dressed, normal affect, normal respiratory effort. Examination patient has a small end bearing ulcer on the right transtibial amputation with callused skin.  Patient states he has been getting modifications to the socket.  The left calf measures 40 cm in circumference with pitting edema.  He is currently in an extra-large sock and recommended that he obtained more extra-large socks.  Imaging: No results found.   Labs: Lab Results  Component Value Date   HGBA1C 5.4 04/15/2022   ESRSEDRATE 14 04/08/2022   CRP 2.1 04/08/2022   REPTSTATUS 06/25/2022 FINAL 06/20/2022   GRAMSTAIN  06/20/2022    ABUNDANT WBC PRESENT,BOTH PMN AND MONONUCLEAR NO ORGANISMS SEEN    CULT  06/20/2022    RARE HAEMOPHILUS PARAINFLUENZAE BETA LACTAMASE NEGATIVE FEW GEMELLA MORBILLORUM Standardized susceptibility testing for this organism is not available. Performed at [facility], [address] [place], [place] [address]      Lab Results  Component Value Date   ALBUMIN 3.6 06/20/2022   ALBUMIN 2.3 (L) 05/15/2022   ALBUMIN 3.9 08/27/2015    No results found for: "MG" No results found for: "VD25OH"  No results found for: "PREALBUMIN"    Latest Ref Rng & Units 10/28/2022   10:05 AM 06/20/2022   11:45 AM 05/15/2022    5:14 AM  CBC EXTENDED  WBC 4.0 - 10.5 K/uL  6.3  5.6   RBC 4.22 - 5.81 MIL/uL  3.85  2.55   Hemoglobin 13.0 - 17.0 g/dL 62.112.9  30.812.2  8.1   HCT 65.739.0 - 52.0 % 38.0  36.3  24.2   Platelets 150 - 400 K/uL  231  218   NEUT# 1.7 - 7.7 K/uL  4.2  3.6   Lymph# 0.7 - 4.0 K/uL  1.5  1.3      There is no height or weight on file to calculate BMI.  Orders:  No orders of the defined types were placed in this encounter.  No orders of the defined types were placed in this encounter.    Procedures: No procedures performed  Clinical Data: No additional findings.  ROS:  All other systems negative, except as noted in the HPI. Review of Systems  Objective: Vital Signs: There were no vitals taken for this visit.  Specialty Comments:  No specialty comments available.  PMFS History: Patient Active Problem List   Diagnosis Date Noted   Elevated coronary artery calcium score 08/06/2022   Dehiscence of amputation stump of right lower extremity 06/20/2022   Acute blood loss anemia 05/19/2022   Post-op pain 05/19/2022   Acute renal failure 05/19/2022   S/P BKA (below knee amputation), right 05/14/2022   Below-knee amputation of right lower extremity 05/09/2022   Gangrene of right foot    PAD (peripheral artery disease) 04/17/2022   Acute osteomyelitis of metatarsal bone of right foot    Atherosclerosis of native arteries of the extremities with ulceration 04/15/2022   Peripheral arterial disease 03/25/2022   Obesity 03/21/2022   Prediabetes 03/21/2022   Erectile dysfunction 07/26/2021   Family history of ischemic heart  disease (IHD) 07/26/2021   Pure hypercholesterolemia 07/26/2021   History of adenomatous polyp of colon 02/03/2019   Internal hemorrhoids 02/03/2019   Carotid artery stenosis, asymptomatic 09/05/2015   Cellulitis    Hypertension    Hyperlipidemia    Occlusion and stenosis of carotid artery without mention of cerebral infarction 01/01/2012   Past Medical History:  Diagnosis Date   Carotid artery occlusion    Cellulitis    Erectile dysfunction    Hyperlipidemia    Hypertension    Peripheral vascular disease     Family History  Problem Relation Age of Onset   Other Mother        circulatory problems   Heart attack Mother    Deep vein thrombosis Mother    Heart disease Mother    Hyperlipidemia Father    Hypertension Father    Diabetes Father    Deep vein thrombosis Father    Heart disease Father        before age 68   Heart attack Father    Peripheral vascular disease Father    Hypertension Brother    Hyperlipidemia Brother     Past Surgical History:  Procedure Laterality Date   ABDOMINAL AORTOGRAM W/LOWER EXTREMITY N/A 02/04/2022   Procedure: ABDOMINAL AORTOGRAM W/LOWER EXTREMITY;  Surgeon: Nada LibmanBrabham, Vance W, MD;  Location: [facility];  Service: Cardiovascular;  Laterality: N/A;   ABDOMINAL AORTOGRAM W/LOWER EXTREMITY N/A 02/18/2022   Procedure: ABDOMINAL AORTOGRAM W/LOWER EXTREMITY;  Surgeon: Nada LibmanBrabham, Vance W, MD;  Location: [facility];  Service: Cardiovascular;  Laterality: N/A;   ABDOMINAL AORTOGRAM W/LOWER EXTREMITY N/A 04/15/2022   Procedure: ABDOMINAL AORTOGRAM W/LOWER EXTREMITY;  Surgeon: Nada LibmanBrabham, Vance W, MD;  Location: [facility];  Service: Cardiovascular;  Laterality: N/A;   ABDOMINAL AORTOGRAM W/LOWER EXTREMITY N/A 10/28/2022   Procedure: ABDOMINAL AORTOGRAM W/LOWER EXTREMITY;  Surgeon: Nada LibmanBrabham, Vance W, MD;  Location: [facility];  Service: Cardiovascular;  Laterality: N/A;   AMPUTATION Right 04/16/2022   Procedure: RIGHT 5TH RAY  AMPUTATION;  Surgeon: Nadara Mustarduda, Jinelle Butchko V, MD;  [facility];  Service: Orthopedics;  Laterality: Right;   AMPUTATION Right 05/09/2022   Procedure: RIGHT BELOW KNEE AMPUTATION;  Surgeon: Nadara Mustarduda, Quintavis Brands V, MD;  [facility];  Service: Orthopedics;  Laterality: Right;   APPLICATION OF WOUND VAC Right 06/20/2022   Procedure: APPLICATION OF WOUND VAC;  Surgeon: Nadara Mustarduda, Hideko Esselman V, MD;  [facility]: [facility];  Service: Orthopedics;  Laterality: Right;   COLONOSCOPY     ENDARTERECTOMY Right 09/05/2015   Procedure: ENDARTERECTOMY CAROTID RIGHT;  Surgeon: Sherren Kernsharles E Fields, MD;  [facility];  Service: Vascular;  Laterality: Right;   PATCH ANGIOPLASTY  09/05/2015   Procedure: PATCH ANGIOPLASTY RIGHT CAROTID ARTERY USING HEMASHIELD PLATINUM FINESSE PATCH;  Surgeon: Sherren Kerns, MD;  Location: [facility] OR;  Service: Vascular;;   PERIPHERAL INTRAVASCULAR LITHOTRIPSY Left 10/28/2022   Procedure: INTRAVASCULAR LITHOTRIPSY;  Surgeon: Nada Libman, MD;  [facility];  Service: Cardiovascular;  Laterality: Left;  L peroneal   PERIPHERAL VASCULAR ATHERECTOMY Left 02/18/2022   Procedure: PERIPHERAL VASCULAR ATHERECTOMY;  Surgeon: Nada Libman, MD;  [facility];  Service: Cardiovascular;  Laterality: Left;  Popliteal and Peroneal   PERIPHERAL VASCULAR BALLOON ANGIOPLASTY  02/04/2022   Procedure: PERIPHERAL VASCULAR BALLOON ANGIOPLASTY;  Surgeon: Nada Libman, MD;  [facility]: [facility];  Service: Cardiovascular;;   PERIPHERAL VASCULAR BALLOON ANGIOPLASTY Right 04/15/2022   Procedure: PERIPHERAL VASCULAR BALLOON ANGIOPLASTY;  Surgeon: Nada Libman, MD;  [facility];  Service: Cardiovascular;  Laterality: Right;  SFA/POPLITEAL   PERIPHERAL VASCULAR BALLOON ANGIOPLASTY Left 10/28/2022   Procedure: PERIPHERAL VASCULAR BALLOON ANGIOPLASTY;  Surgeon: Nada Libman, MD;  [facility];  Service: Cardiovascular;  Laterality: Left;  L peroneal and L pop    STUMP REVISION Right 06/20/2022   Procedure: REVISION RIGHT BELOW KNEE AMPUTATION;  Surgeon: Nadara Mustard, MD;  Location: [facility] OR;  Service: Orthopedics;  Laterality: Right;   TONSILLECTOMY     VASECTOMY     Social History   Occupational History   Not on file  Tobacco Use   Smoking status: Never   Smokeless tobacco: Former    Types: Chew    Quit date: 01/01/2015  Vaping Use   Vaping Use: Never used  Substance and Sexual Activity   Alcohol use: Not Currently   Drug use: Not on file   Sexual activity: Not on file

## 2022-11-17 ENCOUNTER — Encounter: Payer: Self-pay | Admitting: Physical Therapy

## 2022-11-17 ENCOUNTER — Ambulatory Visit (INDEPENDENT_AMBULATORY_CARE_PROVIDER_SITE_OTHER): Payer: 59 | Admitting: Physical Therapy

## 2022-11-17 ENCOUNTER — Encounter: Payer: 59 | Admitting: Physical Therapy

## 2022-11-17 DIAGNOSIS — M6281 Muscle weakness (generalized): Secondary | ICD-10-CM

## 2022-11-17 DIAGNOSIS — R2681 Unsteadiness on feet: Secondary | ICD-10-CM

## 2022-11-17 DIAGNOSIS — L98498 Non-pressure chronic ulcer of skin of other sites with other specified severity: Secondary | ICD-10-CM

## 2022-11-17 DIAGNOSIS — R2689 Other abnormalities of gait and mobility: Secondary | ICD-10-CM

## 2022-11-17 NOTE — Therapy (Signed)
OUTPATIENT PHYSICAL THERAPY TREATMENT   Patient Name: Clinton [name] MRN: [id number] DOB:1955/09/08, 67 y.o., male Today's Date: 11/17/2022  PCP: Darrow Bussing, MD REFERRING PROVIDER: Delle Reining, PA-C  END OF SESSION:  PT End of Session - 11/17/22 0758     Visit Number 7    Number of Visits 20    Date for PT Re-Evaluation 01/15/23    Authorization Type [employer]    Authorization Time Period 20 PT visits, 10% co-insurance    Authorization - Number of Visits 20    Progress Note Due on Visit 10    PT Start Time 0758    PT Stop Time [phone]    PT Time Calculation (min) 44 min    Activity Tolerance Patient tolerated treatment well    Behavior During Therapy [facility] for tasks assessed/performed                 Past Medical History:  Diagnosis Date   Carotid artery occlusion    Cellulitis    Erectile dysfunction    Hyperlipidemia    Hypertension    Peripheral vascular disease    Past Surgical History:  Procedure Laterality Date   ABDOMINAL AORTOGRAM W/LOWER EXTREMITY N/A 02/04/2022   Procedure: ABDOMINAL AORTOGRAM W/LOWER EXTREMITY;  Surgeon: Nada Libman, MD;  Location: [facility];  [name]: Cardiovascular;  Laterality: N/A;   ABDOMINAL AORTOGRAM W/LOWER EXTREMITY N/A 02/18/2022   Procedure: ABDOMINAL AORTOGRAM W/LOWER EXTREMITY;  Surgeon: Nada Libman, MD;  Location: [facility];  [name]: Cardiovascular;  Laterality: N/A;   ABDOMINAL AORTOGRAM W/LOWER EXTREMITY N/A 04/15/2022   Procedure: ABDOMINAL AORTOGRAM W/LOWER EXTREMITY;  Surgeon: Nada Libman, MD;  Location: [facility];  [name]: Cardiovascular;  Laterality: N/A;   ABDOMINAL AORTOGRAM W/LOWER EXTREMITY N/A 10/28/2022   Procedure: ABDOMINAL AORTOGRAM W/LOWER EXTREMITY;  Surgeon: Nada Libman, MD;  Location: [facility];  [name]: Cardiovascular;  Laterality: N/A;   AMPUTATION Right 04/16/2022   Procedure: RIGHT 5TH RAY AMPUTATION;  Surgeon: Nadara Mustard, MD;  Location: [facility] OR;   [name]: Orthopedics;  Laterality: Right;   AMPUTATION Right 05/09/2022   Procedure: RIGHT BELOW KNEE AMPUTATION;  Surgeon: Nadara Mustard, MD;  Location: [facility] OR;  [name]: Orthopedics;  Laterality: Right;   APPLICATION OF WOUND VAC Right 06/20/2022   Procedure: APPLICATION OF WOUND VAC;  Surgeon: Nadara Mustard, MD;  Location: [facility] OR;  [name]: Orthopedics;  Laterality: Right;   COLONOSCOPY     ENDARTERECTOMY Right 09/05/2015   Procedure: ENDARTERECTOMY CAROTID RIGHT;  Surgeon: Sherren Kerns, MD;  Location: [facility] OR;  [name]: Vascular;  Laterality: Right;   PATCH ANGIOPLASTY  09/05/2015   Procedure: PATCH ANGIOPLASTY RIGHT CAROTID ARTERY USING HEMASHIELD PLATINUM FINESSE PATCH;  Surgeon: Sherren Kerns, MD;  Location: [facility] OR;  [name]: Vascular;;   PERIPHERAL INTRAVASCULAR LITHOTRIPSY Left 10/28/2022   Procedure: INTRAVASCULAR LITHOTRIPSY;  Surgeon: Nada Libman, MD;  [facility]: [facility];  [name]: Cardiovascular;  Laterality: Left;  L peroneal   PERIPHERAL VASCULAR ATHERECTOMY Left 02/18/2022   Procedure: PERIPHERAL VASCULAR ATHERECTOMY;  Surgeon: Nada Libman, MD;  [facility]: [facility];  [name]: Cardiovascular;  Laterality: Left;  Popliteal and Peroneal   PERIPHERAL VASCULAR BALLOON ANGIOPLASTY  02/04/2022   Procedure: PERIPHERAL VASCULAR BALLOON ANGIOPLASTY;  Surgeon: Nada Libman, MD;  [facility]: [facility];  [name]: Cardiovascular;;   PERIPHERAL VASCULAR BALLOON ANGIOPLASTY Right 04/15/2022   Procedure: PERIPHERAL VASCULAR BALLOON ANGIOPLASTY;  Surgeon: Nada Libman, MD;  [facility]: [facility];  [name]: Cardiovascular;  Laterality: Right;  SFA/POPLITEAL   PERIPHERAL VASCULAR BALLOON ANGIOPLASTY Left 10/28/2022   Procedure: PERIPHERAL VASCULAR BALLOON ANGIOPLASTY;  Surgeon: Nada Libman, MD;  [facility]: [facility];  [name]: Cardiovascular;  Laterality: Left;  L peroneal and L pop   STUMP REVISION Right 06/20/2022   Procedure: REVISION  RIGHT BELOW KNEE AMPUTATION;  Surgeon: Nadara Mustard, MD;  Location: [facility] OR;  [name]: Orthopedics;  Laterality: Right;   TONSILLECTOMY     VASECTOMY     Patient Active Problem List   Diagnosis Date Noted   Elevated coronary artery calcium score 08/06/2022   Dehiscence of amputation stump of right lower extremity 06/20/2022   Acute blood loss anemia 05/19/2022   Post-op pain 05/19/2022   Acute renal failure 05/19/2022   S/P BKA (below knee amputation), right 05/14/2022   Below-knee amputation of right lower extremity 05/09/2022   Gangrene of right foot    PAD (peripheral artery disease) 04/17/2022   Acute osteomyelitis of metatarsal bone of right foot    Atherosclerosis of native arteries of the extremities with ulceration 04/15/2022   Peripheral arterial disease 03/25/2022   Obesity 03/21/2022   Prediabetes 03/21/2022   Erectile dysfunction 07/26/2021   Family history of ischemic heart disease (IHD) 07/26/2021   Pure hypercholesterolemia 07/26/2021   History of adenomatous polyp of colon 02/03/2019   Internal hemorrhoids 02/03/2019   Carotid artery stenosis, asymptomatic 09/05/2015   Cellulitis    Hypertension    Hyperlipidemia    Occlusion and stenosis of carotid artery without mention of cerebral infarction 01/01/2012    ONSET DATE: 10/15/2022 prosthesis delivery  REFERRING DIAG: Z61.096E (ICD-10-CM) - Below-knee amputation of right lower extremity   THERAPY DIAG:  Other abnormalities of gait and mobility  Unsteadiness on feet  Non-pressure chronic ulcer of skin of other sites with other specified severity  Muscle weakness (generalized)  Rationale for Evaluation and Treatment: Rehabilitation  SUBJECTIVE:   SUBJECTIVE STATEMENT:  He wore prosthesis most of awake hours without checking for sweat or dampness.  He has soreness in distal limb when he first gets up after sitting >1 hr, then it decreases.     PERTINENT HISTORY: carotid artery occlusion, HTN, PVD, BLE  vascular sugeries  PAIN:  Are you having pain? No  PRECAUTIONS: None  WEIGHT BEARING RESTRICTIONS: No  FALLS: Has patient fallen in last 6 months? No  LIVING ENVIRONMENT: Lives with: lives with their spouse Lives in: House  single level Home Access: Stairs to enter Home layout: One level Stairs: Yes: External: 3 steps; can reach both Has following equipment at home: Single point cane, Walker - 2 wheeled, Wheelchair (manual), shower chair, and knee scooter  PLOF: Independent, Independent with household mobility without device, and Independent with community mobility without device  circulation issues started in 2019 with worsening 2021,   PATIENT GOALS:   to use prosthesis to travel, golf, climb ladder, go to gym, active in community including coaching,    OBJECTIVE:  COGNITION: Overall cognitive status: Within functional limits for tasks assessed   SENSATION: WFL  POSTURE: flexed trunk  and weight shift left  LOWER EXTREMITY ROM:  ROM P:passive  A:active Right eval Left eval  Hip flexion    Hip extension    Hip abduction    Hip adduction    Hip internal rotation    Hip external rotation    Knee flexion [facility]   Knee  extension [facility]   Ankle dorsiflexion    Ankle plantarflexion    Ankle inversion    Ankle eversion     (Blank rows = not tested)  LOWER EXTREMITY MMT:  MMT Right eval Left eval  Hip flexion 5/5 5/5  Hip extension 4/5   Hip abduction 4/5   Hip adduction    Hip internal rotation    Hip external rotation    Knee flexion 4/5   Knee extension 4/5   Ankle dorsiflexion    Ankle plantarflexion    Ankle inversion    Ankle eversion    (Blank rows = not tested)  TRANSFERS: 11/17/2022: sit to /from stand: independent without UE assist  10/22/2022 / Eval: Sit to stand: Modified independence Stand to sit: Modified independence  GAIT: 11/17/2022:  Pt amb community distances with cane including neg ramps & curbs safely.  Pt amb up to 200' (residual  limb pain limits distances) without device safely with verbal cues only to correct deviations. Gait Velocity: self-selected 2.13 ft/sec & fast pace 2.67 ft/sec Functional Gait Assessment: 10/30  10/22/2022 / Eval: Gait pattern: step to pattern, decreased step length- Left, decreased stance time- Right, decreased hip/knee flexion- Right, Right hip hike, knee flexed in stance- Right, antalgic, trunk flexed, and abducted- Right Distance walked: 50' Assistive device utilized: Environmental consultant - 2 wheeled and TTA prosthesis Level of assistance: SBA / cues for deviations no balance issues noted with RW Comments: due to wound issues PT recommending RW for BUE support initially  FUNCTIONAL TESTs:  11/17/2022: Sharlene Motts Balance 50/56 & Functional Gait Assessment 10/30  [facility] PT Assessment - 11/17/22 0811       Standardized Balance Assessment   Standardized Balance Assessment Berg Balance Test      Berg Balance Test   Sit to Stand Able to stand without using hands and stabilize independently    Standing Unsupported Able to stand safely 2 minutes    Sitting with Back Unsupported but Feet Supported on Floor or Stool Able to sit safely and securely 2 minutes    Stand to Sit Sits safely with minimal use of hands    Transfers Able to transfer safely, minor use of hands    Standing Unsupported with Eyes Closed Able to stand 10 seconds safely    Standing Unsupported with Feet Together Able to place feet together independently and stand 1 minute safely    From Standing, Reach Forward with Outstretched Arm Can reach confidently >25 cm (10")    From Standing Position, Pick up Object from Floor Able to pick up shoe safely and easily    From Standing Position, Turn to Look Behind Over each Shoulder Looks behind from both sides and weight shifts well    Turn 360 Degrees Able to turn 360 degrees safely but slowly    Standing Unsupported, Alternately Place Feet on Step/Stool Able to complete 4 steps without aid or supervision     Standing Unsupported, One Foot in Front Able to plae foot ahead of the other independently and hold 30 seconds    Standing on One Leg Able to lift leg independently and hold 5-10 seconds    Total Score 50    Berg comment: BERG  < 36 high risk for falls (close to 100%) 46-51 moderate (>50%)   37-45 significant (>80%) 52-55 lower (> 25%)      Functional Gait  Assessment   Gait assessed  Yes    Gait Level Surface Walks 20 ft, slow speed, abnormal gait  pattern, evidence for imbalance or deviates 10-15 in outside of the 12 in walkway width. Requires more than 7 sec to ambulate 20 ft.    Change in Gait Speed Makes only minor adjustments to walking speed, or accomplishes a change in speed with significant gait deviations, deviates 10-15 in outside the 12 in walkway width, or changes speed but loses balance but is able to recover and continue walking.    Gait with Horizontal Head Turns Performs head turns with moderate changes in gait velocity, slows down, deviates 10-15 in outside 12 in walkway width but recovers, can continue to walk.    Gait with Vertical Head Turns Performs task with moderate change in gait velocity, slows down, deviates 10-15 in outside 12 in walkway width but recovers, can continue to walk.    Gait and Pivot Turn Turns slowly, requires verbal cueing, or requires several small steps to catch balance following turn and stop    Step Over Obstacle Is able to step over one shoe box (4.5 in total height) but must slow down and adjust steps to clear box safely. May require verbal cueing.    Gait with Narrow Base of Support Ambulates less than 4 steps heel to toe or cannot perform without assistance.    Gait with Eyes Closed Walks 20 ft, slow speed, abnormal gait pattern, evidence for imbalance, deviates 10-15 in outside 12 in walkway width. Requires more than 9 sec to ambulate 20 ft.    Ambulating Backwards Walks 20 ft, slow speed, abnormal gait pattern, evidence for imbalance, deviates  10-15 in outside 12 in walkway width.    Steps Alternating feet, must use rail.    Total Score 10    FGA comment: Max score 30  Interpretation: 25-28 = low risk fall   19-24 = medium risk fall  < 19 = high risk fall            Eval / 10/22/2022:  Sharlene Motts Balance Scale: 36/56  CURRENT PROSTHETIC WEAR ASSESSMENT: 11/17/2022: Pt wearing prosthesis most of awake hours except bathing & last 1-1.5 hours of day. Wound scab at distal tibia. PT continues to advise / problem-solve limb pain & other prosthetic issues.    Eval / 10/22/2022: Patient is dependent with: skin check, residual limb care, prosthetic cleaning, ply sock cleaning, correct ply sock adjustment, proper wear schedule/adjustment, and proper weight-bearing schedule/adjustment Donning prosthesis: SBA Doffing prosthesis: Modified independence Prosthetic wear tolerance: 1-1.5 hours, 2x/day, for last 2 days.  He wore prosthesis 1hr 2x on 3/8 and got water blisters.  Stopped wear on 3/9 & 3/10, resumed 3/11 1.5 hrs 2x/day for 2 days.  Prosthetic weight bearing tolerance: 5 minutes Edema: pitting Residual limb condition: draining (serous) wound at distal tibia, 2nd open blister on distal limb, minimal hair growth, cylindrical shape, no warmth or abnormal color changes noted.  Prosthetic description: silicon liner with pin lock suspension, total contact socket with flexible inner liner, 0-ply socks, dynamic response foot K code/activity level with prosthetic use: Level 3    TODAY'S TREATMENT:  DATE:  11/17/2022: Prosthetic Training with TTA prosthesis: See Functional Gait Assessment, gait, balance & prosthetic care/ use. PT instructed in stepping over obstacles:  6" height forward facing & 10" height side stepping.  Pt able to perform with supervision.  Picking up pace with longer & quicker steps. Pt able to perform for  3 steps intermittently with supervision. Stairs alternating pattern for flight 1 steps.  First flight with 2 rails, then 2nd & 3rd with contralateral rail.  PT cues & supervision.  Pt verbalizes PT recommendation for checking limb / liner for dampness every 4 hours.  Work on above functional activities.    11/12/2022: Prosthetic Training with TTA prosthesis: PT recommended continue to attempt wear 6hrs 2x/day as able to tolerate related to limb pain & monitor wound.  PT recommending appt with [facility] prosthetist for alignment check when amb without device and check if shrinkers are correct size for limb volume.  Do not add pads until he is wearing 3 or more ply socks at all times. PT also recommended creating packing list for travel. Pt verbalized understanding.  PT used line on floor for visual reference, verbal cues & proprioception for proper step width and LLE heel rise in terminal stance to facilitate weight over prosthesis medial-lateral and ant-post. Pt amb with cane 300' X 2 and 85' without device.   Therapeutic Exercise: PT recommending initiating cardio exercise with seated equipment. PT instructed in technique & set-up of bike with TTA prosthesis.  Pt rode recumbent bike seat 8 level 1 for 5 min. PT demo & verbal cues on UE weight machines with pelvis level. Pt verbalized understanding.    11/10/2022: Prosthetic Training with TTA prosthesis: Wound continues to have scab with no signs of infection but one white spot that could be underlying suture trying to expel.  PT recommended wear 6 hours 2x/day with drying or checking under liner for dampness half way of each 6 hour wear. Pt verbalized understanding. Pt amb 50' X 3 without device safely with slight decrease stance time on prosthesis.  PT demo & verbal cues on lifting items from floor.  Initially timer (no wt) safely. Then 10# kettle bell with BUEs 3 reps.   Pt amb 25' with 10# kettle bell RUE & 25' in LUE with supervision.  PT  recommended walking in home & office without AD unless he notices limb tenderness. Will need to build frequency of short distances on level surfaces. Pt verbalized understanding.  PT demo & verbal cues on floor transfer using chair to push. Pt performed safely. PT recommended 1-2 times per day to build stronger & learn technique better. Pt verbalized understanding.  PT demo & verbal on push / pull motion with offset feet and weight shift between feet. Pt performed with 5# on cane to simulate "weed eating" with only minor weight shift onto prosthesis.     PATIENT EDUCATION: PATIENT EDUCATED ON FOLLOWING PROSTHETIC CARE: Education details: PT instructed in use of Vivewear under liner with prosthesis wear. When prosthesis is off, Vivewear & tubular compression shrinker.   Skin check, Residual limb care, Prosthetic cleaning, Correct ply sock adjustment, Propper donning, Proper wear schedule/adjustment, and Proper weight-bearing schedule/adjustment Prosthetic wear tolerance: 1.5 hours 3x/day, 7 days/week with plans to increase every 5-7 days depending on issues. Person educated: Patient and Spouse Education method: Explanation, Demonstration, Tactile cues, and Verbal cues Education comprehension: verbalized understanding, verbal cues required, tactile cues required, and needs further education  HOME EXERCISE PROGRAM: HEP 11/04/2022 with PT demo &  verbal cues. Pt & wife verbalized understanding with Pt return demo.  1.  Sidestepping right and left along counter with cues on engaging bilateral lower extremities. 10' 3 laps.  2.  Counter RUE and cane LUE -walking forward with equal step length using heel toe as marker and walking backwards with weight shift over the prosthesis in stance. 10' 3 laps.   3.  Weight shift over RLE in stance with tightening hip muscles for stabilization tapping left forefoot in lower cabinet. 10 reps 2 sets.  4. Sit to / from stand 18" chair without armrest using upper  extremities lightly on seat.  PT demo and verbal cues on technique to decrease upper extremity support and increased lower extremity utilization.  10 reps 2 sets.  5.  Picking up washcloth from floor. PT demo & verbal cues on technique with TTA prosthesis and set-up for safety.  10 reps 2 sets.   ASSESSMENT:  CLINICAL IMPRESSION: Patient improved balance with Berg score 50/56 from 36/56 indicating lower fall risk. Functional Gait Assessment 10/30 indicates high fall risk with gait.  He continues to benefit from skilled PT.   OBJECTIVE IMPAIRMENTS: Abnormal gait, decreased activity tolerance, decreased balance, decreased knowledge of condition, decreased knowledge of use of DME, decreased mobility, difficulty walking, decreased strength, increased edema, prosthetic dependency , and wound on residual limb .   ACTIVITY LIMITATIONS: carrying, lifting, bending, standing, squatting, stairs, transfers, locomotion level, and prosthesis use  PARTICIPATION LIMITATIONS: meal prep, cleaning, driving, community activity, occupation, yard work, and recreational activities.  PERSONAL FACTORS: Fitness, Time since onset of injury/illness/exacerbation, and 3+ comorbidities: see PMH  are also affecting patient's functional outcome.   REHAB POTENTIAL: Good  CLINICAL DECISION MAKING: Evolving/moderate complexity  EVALUATION COMPLEXITY: Moderate   GOALS: Goals reviewed with patient? Yes  SHORT TERM GOALS: Target date: 11/20/2022  Patient donnes prosthesis modified independent & verbalizes proper cleaning. Baseline: SEE OBJECTIVE DATA Goal status: MET 11/17/2022 2.  Patient tolerates prosthesis >10 hrs total /day without skin issues increasing and no limb pain. Baseline: SEE OBJECTIVE DATA Goal status: MET 11/17/2022  3.  Berg >/= 45/56. Baseline: SEE OBJECTIVE DATA Goal status: MET 11/17/2022  4. Patient ambulates 100' with cane & prosthesis with supervision. Baseline: SEE OBJECTIVE DATA Goal status:  MET 11/17/2022  5. Patient negotiates ramps & curbs with cane & prosthesis with minA. Baseline: SEE OBJECTIVE DATA Goal status: MET 11/17/2022  LONG TERM GOALS: Target date: 01/15/2023  Patient demonstrates & verbalized understanding of prosthetic care to enable safe utilization of prosthesis. Baseline: SEE OBJECTIVE DATA Goal status: Ongoing 10/27/2022  Patient tolerates prosthesis wear >90% of awake hours without skin or limb pain issues. Baseline: SEE OBJECTIVE DATA Goal status: Ongoing 10/27/2022  Functional Gait Assessment >/= 19/30 to indicate lower fall risk Baseline: SEE OBJECTIVE DATA Goal status: Ongoing 10/27/2022  Patient ambulates >500' with prosthesis only independently Baseline: SEE OBJECTIVE DATA Goal status: Ongoing 10/27/2022  Patient negotiates ramps, curbs & stairs with single rail with prosthesis only independently. Baseline: SEE OBJECTIVE DATA Goal status: Ongoing 10/27/2022  Patient demonstrates & verbalizes ability to perform yard work & golf with prosthesis only safely. Baseline: SEE OBJECTIVE DATA Goal status: Ongoing 10/27/2022   PLAN:  PT FREQUENCY: 1-2x/week  PT DURATION: 12 weeks  PLANNED INTERVENTIONS: Therapeutic exercises, Therapeutic activity, Neuromuscular re-education, Balance training, Gait training, Patient/Family education, Self Care, Stair training, Prosthetic training, Re-evaluation, and physical performance testing.  PLAN FOR NEXT SESSION:  progress gait activities to include components of FGA,  continue  check limb and review prosthetic care issues, therapeutic exercise & balance activities.   Vladimir Faster, PT, DPT 11/17/2022, 9:00 AM

## 2022-11-18 ENCOUNTER — Encounter: Payer: Self-pay | Admitting: Orthopedic Surgery

## 2022-11-19 ENCOUNTER — Encounter: Payer: 59 | Admitting: Physical Therapy

## 2022-11-20 ENCOUNTER — Encounter: Payer: Self-pay | Admitting: Physical Therapy

## 2022-11-20 ENCOUNTER — Ambulatory Visit (INDEPENDENT_AMBULATORY_CARE_PROVIDER_SITE_OTHER): Payer: 59 | Admitting: Physical Therapy

## 2022-11-20 DIAGNOSIS — M6281 Muscle weakness (generalized): Secondary | ICD-10-CM

## 2022-11-20 DIAGNOSIS — L98498 Non-pressure chronic ulcer of skin of other sites with other specified severity: Secondary | ICD-10-CM | POA: Diagnosis not present

## 2022-11-20 DIAGNOSIS — R2689 Other abnormalities of gait and mobility: Secondary | ICD-10-CM | POA: Diagnosis not present

## 2022-11-20 DIAGNOSIS — R2681 Unsteadiness on feet: Secondary | ICD-10-CM | POA: Diagnosis not present

## 2022-11-20 NOTE — Therapy (Signed)
OUTPATIENT PHYSICAL THERAPY TREATMENT   Patient Name: TIQUAN BOUCH MRN: [id number] DOB:10-Jun-1956, 67 y.o., male Today's Date: 11/20/2022  PCP: Darrow Bussing, MD REFERRING PROVIDER: Delle Reining, PA-C  END OF SESSION:  PT End of Session - 11/20/22 0759     Visit Number 8    Number of Visits 20    Date for PT Re-Evaluation 01/15/23    Authorization Type [employer]    Authorization Time Period 20 PT visits, 10% co-insurance    Authorization - Number of Visits 20    Progress Note Due on Visit 10    PT Start Time 0759    PT Stop Time [phone]    PT Time Calculation (min) 43 min    Activity Tolerance Patient tolerated treatment well    Behavior During Therapy [facility] for tasks assessed/performed                  Past Medical History:  Diagnosis Date   Carotid artery occlusion    Cellulitis    Erectile dysfunction    Hyperlipidemia    Hypertension    Peripheral vascular disease    Past Surgical History:  Procedure Laterality Date   ABDOMINAL AORTOGRAM W/LOWER EXTREMITY N/A 02/04/2022   Procedure: ABDOMINAL AORTOGRAM W/LOWER EXTREMITY;  Surgeon: Nada Libman, MD;  Location: [facility];  Service: Cardiovascular;  Laterality: N/A;   ABDOMINAL AORTOGRAM W/LOWER EXTREMITY N/A 02/18/2022   Procedure: ABDOMINAL AORTOGRAM W/LOWER EXTREMITY;  Surgeon: Nada Libman, MD;  Location: [facility];  Service: Cardiovascular;  Laterality: N/A;   ABDOMINAL AORTOGRAM W/LOWER EXTREMITY N/A 04/15/2022   Procedure: ABDOMINAL AORTOGRAM W/LOWER EXTREMITY;  Surgeon: Nada Libman, MD;  Location: [facility];  Service: Cardiovascular;  Laterality: N/A;   ABDOMINAL AORTOGRAM W/LOWER EXTREMITY N/A 10/28/2022   Procedure: ABDOMINAL AORTOGRAM W/LOWER EXTREMITY;  Surgeon: Nada Libman, MD;  Location: [facility];  Service: Cardiovascular;  Laterality: N/A;   AMPUTATION Right 04/16/2022   Procedure: RIGHT 5TH RAY AMPUTATION;  Surgeon: Nadara Mustard, MD;  Location: [facility]  OR;  Service: Orthopedics;  Laterality: Right;   AMPUTATION Right 05/09/2022   Procedure: RIGHT BELOW KNEE AMPUTATION;  Surgeon: Nadara Mustard, MD;  Location: [facility] OR;  Service: Orthopedics;  Laterality: Right;   APPLICATION OF WOUND VAC Right 06/20/2022   Procedure: APPLICATION OF WOUND VAC;  Surgeon: Nadara Mustard, MD;  Location: [facility];  Service: Orthopedics;  Laterality: Right;   COLONOSCOPY     ENDARTERECTOMY Right 09/05/2015   Procedure: ENDARTERECTOMY CAROTID RIGHT;  Surgeon: Sherren Kerns, MD;  Location: [facility] OR;  Service: Vascular;  Laterality: Right;   PATCH ANGIOPLASTY  09/05/2015   Procedure: PATCH ANGIOPLASTY RIGHT CAROTID ARTERY USING HEMASHIELD PLATINUM FINESSE PATCH;  Surgeon: Sherren Kerns, MD;  Location: [facility] OR;  Service: Vascular;;   PERIPHERAL INTRAVASCULAR LITHOTRIPSY Left 10/28/2022   Procedure: INTRAVASCULAR LITHOTRIPSY;  Surgeon: Nada Libman, MD;  Location: [facility];  Service: Cardiovascular;  Laterality: Left;  L peroneal   PERIPHERAL VASCULAR ATHERECTOMY Left 02/18/2022   Procedure: PERIPHERAL VASCULAR ATHERECTOMY;  Surgeon: Nada Libman, MD;  Location: [facility];  Service: Cardiovascular;  Laterality: Left;  Popliteal and Peroneal   PERIPHERAL VASCULAR BALLOON ANGIOPLASTY  02/04/2022   Procedure: PERIPHERAL VASCULAR BALLOON ANGIOPLASTY;  Surgeon: Nada Libman, MD;  Location: [facility];  Service: Cardiovascular;;   PERIPHERAL VASCULAR BALLOON ANGIOPLASTY Right 04/15/2022   Procedure: PERIPHERAL VASCULAR BALLOON ANGIOPLASTY;  Surgeon: Nada LibmanBrabham, Vance W, MD;  Location: [facility];  Service: Cardiovascular;  Laterality: Right;  SFA/POPLITEAL   PERIPHERAL VASCULAR BALLOON ANGIOPLASTY Left 10/28/2022   Procedure: PERIPHERAL VASCULAR BALLOON ANGIOPLASTY;  Surgeon: Nada LibmanBrabham, Vance W, MD;  Location: [facility];  Service: Cardiovascular;  Laterality: Left;  L peroneal and L pop   STUMP REVISION Right 06/20/2022   Procedure:  REVISION RIGHT BELOW KNEE AMPUTATION;  Surgeon: Nadara Mustarduda, Marcus V, MD;  [facility];  Service: Orthopedics;  Laterality: Right;   TONSILLECTOMY     VASECTOMY     Patient Active Problem List   Diagnosis Date Noted   Elevated coronary artery calcium score 08/06/2022   Dehiscence of amputation stump of right lower extremity 06/20/2022   Acute blood loss anemia 05/19/2022   Post-op pain 05/19/2022   Acute renal failure 05/19/2022   S/P BKA (below knee amputation), right 05/14/2022   Below-knee amputation of right lower extremity 05/09/2022   Gangrene of right foot    PAD (peripheral artery disease) 04/17/2022   Acute osteomyelitis of metatarsal bone of right foot    Atherosclerosis of native arteries of the extremities with ulceration 04/15/2022   Peripheral arterial disease 03/25/2022   Obesity 03/21/2022   Prediabetes 03/21/2022   Erectile dysfunction 07/26/2021   Family history of ischemic heart disease (IHD) 07/26/2021   Pure hypercholesterolemia 07/26/2021   History of adenomatous polyp of colon 02/03/2019   Internal hemorrhoids 02/03/2019   Carotid artery stenosis, asymptomatic 09/05/2015   Cellulitis    Hypertension    Hyperlipidemia    Occlusion and stenosis of carotid artery without mention of cerebral infarction 01/01/2012    ONSET DATE: 10/15/2022 prosthesis delivery  REFERRING DIAG: [id number] (ICD-10-CM) - Below-knee amputation of right lower extremity   THERAPY DIAG:  Other abnormalities of gait and mobility  Unsteadiness on feet  Non-pressure chronic ulcer of skin of other sites with other specified severity  Muscle weakness (generalized)  Rationale for Evaluation and Treatment: Rehabilitation  SUBJECTIVE:   SUBJECTIVE STATEMENT:  He has been using cane in home & office due to pain. He has been adjusting socks.  PERTINENT HISTORY: carotid artery occlusion, HTN, PVD, BLE vascular sugeries  PAIN:  Are you having pain? No  PRECAUTIONS: None  WEIGHT  BEARING RESTRICTIONS: No  FALLS: Has patient fallen in last 6 months? No  LIVING ENVIRONMENT: Lives with: lives with their spouse Lives in: House  single level Home Access: Stairs to enter Home layout: One level Stairs: Yes: External: 3 steps; can reach both Has following equipment at home: Single point cane, Walker - 2 wheeled, Wheelchair (manual), shower chair, and knee scooter  PLOF: Independent, Independent with household mobility without device, and Independent with community mobility without device  circulation issues started in 2019 with worsening 2021,   PATIENT GOALS:   to use prosthesis to travel, golf, climb ladder, go to gym, active in community including coaching,    OBJECTIVE:  COGNITION: Overall cognitive status: Within functional limits for tasks assessed   SENSATION: WFL  POSTURE: flexed trunk  and weight shift left  LOWER EXTREMITY ROM:  ROM P:passive  A:active Right eval Left eval  Hip flexion    Hip extension    Hip abduction    Hip adduction    Hip internal rotation    Hip external rotation    Knee flexion WFL   Knee extension [facility]   Ankle dorsiflexion    Ankle plantarflexion    Ankle inversion  Ankle eversion     (Blank rows = not tested)  LOWER EXTREMITY MMT:  MMT Right eval Left eval  Hip flexion 5/5 5/5  Hip extension 4/5   Hip abduction 4/5   Hip adduction    Hip internal rotation    Hip external rotation    Knee flexion 4/5   Knee extension 4/5   Ankle dorsiflexion    Ankle plantarflexion    Ankle inversion    Ankle eversion    (Blank rows = not tested)  TRANSFERS: 11/17/2022: sit to /from stand: independent without UE assist  10/22/2022 / Eval: Sit to stand: Modified independence Stand to sit: Modified independence  GAIT: 11/17/2022:  Pt amb community distances with cane including neg ramps & curbs safely.  Pt amb up to 200' (residual limb pain limits distances) without device safely with verbal cues only to correct  deviations. Gait Velocity: self-selected 2.13 ft/sec & fast pace 2.67 ft/sec Functional Gait Assessment: 10/30  10/22/2022 / Eval: Gait pattern: step to pattern, decreased step length- Left, decreased stance time- Right, decreased hip/knee flexion- Right, Right hip hike, knee flexed in stance- Right, antalgic, trunk flexed, and abducted- Right Distance walked: 50' Assistive device utilized: Environmental consultant - 2 wheeled and TTA prosthesis Level of assistance: SBA / cues for deviations no balance issues noted with RW Comments: due to wound issues PT recommending RW for BUE support initially  FUNCTIONAL TESTs:  11/17/2022: Sharlene Motts Balance 50/56 & Functional Gait Assessment 10/30   Eval / 10/22/2022:  Sharlene Motts Balance Scale: 36/56  CURRENT PROSTHETIC WEAR ASSESSMENT: 11/17/2022: Pt wearing prosthesis most of awake hours except bathing & last 1-1.5 hours of day. Wound scab at distal tibia. PT continues to advise / problem-solve limb pain & other prosthetic issues.    Eval / 10/22/2022: Patient is dependent with: skin check, residual limb care, prosthetic cleaning, ply sock cleaning, correct ply sock adjustment, proper wear schedule/adjustment, and proper weight-bearing schedule/adjustment Donning prosthesis: SBA Doffing prosthesis: Modified independence Prosthetic wear tolerance: 1-1.5 hours, 2x/day, for last 2 days.  He wore prosthesis 1hr 2x on 3/8 and got water blisters.  Stopped wear on 3/9 & 3/10, resumed 3/11 1.5 hrs 2x/day for 2 days.  Prosthetic weight bearing tolerance: 5 minutes Edema: pitting Residual limb condition: draining (serous) wound at distal tibia, 2nd open blister on distal limb, minimal hair growth, cylindrical shape, no warmth or abnormal color changes noted.  Prosthetic description: silicon liner with pin lock suspension, total contact socket with flexible inner liner, 0-ply socks, dynamic response foot K code/activity level with prosthetic use: Level 3    TODAY'S TREATMENT:                                                                                                                              DATE:  11/20/2022: Prosthetic Training with TTA prosthesis: PT showed pt Vivewear website to purchase more underliners (liner liner).  Circumferential 31.5cm indicates size III/large. Pt verbalized understanding. PT  reviewed recommendation for scar mobilization. Pt verbalized understanding.  PT worked on functional balance tossing ball ~10' and swinging racket similar to baseball with minor balance loss that he self corrected.   Pt amb without device working on scanning up/down, right/left & diagonals maintaining path & pace.  Worked on ability to walk in dark by alternating eyes open 3 steps / eyes closed 3 steps.  He improved both gait skills with instruction. Pt neg ramp & curb without device with supervision & cues.    11/17/2022: Prosthetic Training with TTA prosthesis: See Functional Gait Assessment, gait, balance & prosthetic care/ use. PT instructed in stepping over obstacles:  6" height forward facing & 10" height side stepping.  Pt able to perform with supervision.  Picking up pace with longer & quicker steps. Pt able to perform for 3 steps intermittently with supervision. Stairs alternating pattern for flight 1 steps.  First flight with 2 rails, then 2nd & 3rd with contralateral rail.  PT cues & supervision.  Pt verbalizes PT recommendation for checking limb / liner for dampness every 4 hours.  Work on above functional activities.    11/12/2022: Prosthetic Training with TTA prosthesis: PT recommended continue to attempt wear 6hrs 2x/day as able to tolerate related to limb pain & monitor wound.  PT recommending appt with [facility] prosthetist for alignment check when amb without device and check if shrinkers are correct size for limb volume.  Do not add pads until he is wearing 3 or more ply socks at all times. PT also recommended creating packing list for travel. Pt verbalized  understanding.  PT used line on floor for visual reference, verbal cues & proprioception for proper step width and LLE heel rise in terminal stance to facilitate weight over prosthesis medial-lateral and ant-post. Pt amb with cane 300' X 2 and 22' without device.   Therapeutic Exercise: PT recommending initiating cardio exercise with seated equipment. PT instructed in technique & set-up of bike with TTA prosthesis.  Pt rode recumbent bike seat 8 level 1 for 5 min. PT demo & verbal cues on UE weight machines with pelvis level. Pt verbalized understanding.    PATIENT EDUCATION: PATIENT EDUCATED ON FOLLOWING PROSTHETIC CARE: Education details: PT instructed in use of Vivewear under liner with prosthesis wear. When prosthesis is off, Vivewear & tubular compression shrinker.   Skin check, Residual limb care, Prosthetic cleaning, Correct ply sock adjustment, Propper donning, Proper wear schedule/adjustment, and Proper weight-bearing schedule/adjustment Prosthetic wear tolerance: 1.5 hours 3x/day, 7 days/week with plans to increase every 5-7 days depending on issues. Person educated: Patient and Spouse Education method: Explanation, Demonstration, Tactile cues, and Verbal cues Education comprehension: verbalized understanding, verbal cues required, tactile cues required, and needs further education  HOME EXERCISE PROGRAM: HEP 11/04/2022 with PT demo & verbal cues. Pt & wife verbalized understanding with Pt return demo.  1.  Sidestepping right and left along counter with cues on engaging bilateral lower extremities. 10' 3 laps.  2.  Counter RUE and cane LUE -walking forward with equal step length using heel toe as marker and walking backwards with weight shift over the prosthesis in stance. 10' 3 laps.   3.  Weight shift over RLE in stance with tightening hip muscles for stabilization tapping left forefoot in lower cabinet. 10 reps 2 sets.  4. Sit to / from stand 18" chair without armrest using upper  extremities lightly on seat.  PT demo and verbal cues on technique to decrease upper extremity support and increased  lower extremity utilization.  10 reps 2 sets.  5.  Picking up washcloth from floor. PT demo & verbal cues on technique with TTA prosthesis and set-up for safety.  10 reps 2 sets.   ASSESSMENT:  CLINICAL IMPRESSION: Patient had more residual limb pain today limiting his standing & gait tolerance.  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 He had improved balance with basic baseball activities.  He continues to benefit from skilled PT.   OBJECTIVE IMPAIRMENTS: Abnormal gait, decreased activity tolerance, decreased balance, decreased knowledge of condition, decreased knowledge of use of DME, decreased mobility, difficulty walking, decreased strength, increased edema, prosthetic dependency , and wound on residual limb .   ACTIVITY LIMITATIONS: carrying, lifting, bending, standing, squatting, stairs, transfers, locomotion level, and prosthesis use  PARTICIPATION LIMITATIONS: meal prep, cleaning, driving, community activity, occupation, yard work, and recreational activities.  PERSONAL FACTORS: Fitness, Time since onset of injury/illness/exacerbation, and 3+ comorbidities: see PMH  are also affecting patient's functional outcome.   REHAB POTENTIAL: Good  CLINICAL DECISION MAKING: Evolving/moderate complexity  EVALUATION COMPLEXITY: Moderate   GOALS: Goals reviewed with patient? Yes  SHORT TERM GOALS: Target date: 11/20/2022  Patient donnes prosthesis modified independent & verbalizes proper cleaning. Baseline: SEE OBJECTIVE DATA Goal status: MET 11/17/2022 2.  Patient tolerates prosthesis >10 hrs total /day without skin issues increasing and no limb pain. Baseline: SEE OBJECTIVE DATA Goal status: MET 11/17/2022  3.  Berg >/= 45/56. Baseline: SEE OBJECTIVE DATA Goal status: MET 11/17/2022  4. Patient ambulates 100' with cane & prosthesis with  supervision. Baseline: SEE OBJECTIVE DATA Goal status: MET 11/17/2022  5. Patient negotiates ramps & curbs with cane & prosthesis with minA. Baseline: SEE OBJECTIVE DATA Goal status: MET 11/17/2022  LONG TERM GOALS: Target date: 01/15/2023  Patient demonstrates & verbalized understanding of prosthetic care to enable safe utilization of prosthesis. Baseline: SEE OBJECTIVE DATA Goal status: Ongoing 10/27/2022  Patient tolerates prosthesis wear >90% of awake hours without skin or limb pain issues. Baseline: SEE OBJECTIVE DATA Goal status: Ongoing 10/27/2022  Functional Gait Assessment >/= 19/30 to indicate lower fall risk Baseline: SEE OBJECTIVE DATA Goal status: Ongoing 10/27/2022  Patient ambulates >500' with prosthesis only independently Baseline: SEE OBJECTIVE DATA Goal status: Ongoing 10/27/2022  Patient negotiates ramps, curbs & stairs with single rail with prosthesis only independently. Baseline: SEE OBJECTIVE DATA Goal status: Ongoing 10/27/2022  Patient demonstrates & verbalizes ability to perform yard work & golf with prosthesis only safely. Baseline: SEE OBJECTIVE DATA Goal status: Ongoing 10/27/2022   PLAN:  PT FREQUENCY: 1-2x/week  PT DURATION: 12 weeks  PLANNED INTERVENTIONS: Therapeutic exercises, Therapeutic activity, Neuromuscular re-education, Balance training, Gait training, Patient/Family education, Self Care, Stair training, Prosthetic training, Re-evaluation, and physical performance testing.  PLAN FOR NEXT SESSION: begin to reduce frequency to 1x/wk with instruction for activities to work on over the following week,  progress gait activities to include components of FGA,  continue check limb and review prosthetic care issues, therapeutic exercise & balance activities.   Vladimir Faster, PT, DPT 11/20/2022, 12:41 PM

## 2022-11-24 ENCOUNTER — Ambulatory Visit (HOSPITAL_COMMUNITY)
Admission: RE | Admit: 2022-11-24 | Discharge: 2022-11-24 | Disposition: A | Discharge status: Home / Self Care | Payer: 59 | Source: Ambulatory Visit | Attending: Surgery | Admitting: Surgery

## 2022-11-24 ENCOUNTER — Ambulatory Visit (INDEPENDENT_AMBULATORY_CARE_PROVIDER_SITE_OTHER)
Admission: RE | Admit: 2022-11-24 | Discharge: 2022-11-24 | Disposition: A | Discharge status: Home / Self Care | Payer: 59 | Source: Ambulatory Visit | Attending: Surgery | Admitting: Surgery

## 2022-11-24 ENCOUNTER — Encounter: Payer: 59 | Admitting: Physical Therapy

## 2022-11-24 ENCOUNTER — Ambulatory Visit (INDEPENDENT_AMBULATORY_CARE_PROVIDER_SITE_OTHER): Payer: 59 | Admitting: Surgery

## 2022-11-24 ENCOUNTER — Ambulatory Visit (HOSPITAL_BASED_OUTPATIENT_CLINIC_OR_DEPARTMENT_OTHER): Payer: 59 | Admitting: Internal Medicine

## 2022-11-24 ENCOUNTER — Encounter: Payer: Self-pay | Admitting: Surgery

## 2022-11-24 VITALS — BP 155/69 | HR 55 | Temp 98.0°F | Resp 20 | Ht 70.0 in | Wt 193.0 lb

## 2022-11-24 DIAGNOSIS — I7025 Atherosclerosis of native arteries of other extremities with ulceration: Secondary | ICD-10-CM | POA: Diagnosis present

## 2022-11-24 DIAGNOSIS — I70213 Atherosclerosis of native arteries of extremities with intermittent claudication, bilateral legs: Secondary | ICD-10-CM | POA: Diagnosis not present

## 2022-11-24 DIAGNOSIS — I6523 Occlusion and stenosis of bilateral carotid arteries: Secondary | ICD-10-CM | POA: Diagnosis not present

## 2022-11-24 DIAGNOSIS — I739 Peripheral vascular disease, unspecified: Secondary | ICD-10-CM

## 2022-11-24 LAB — VAS US ABI WITH/WO TBI: Left ABI: 0.72

## 2022-11-24 NOTE — Progress Notes (Signed)
[facility] [place]  Patient name: BINNIE VONDERHAAR MRN: [id number] DOB: Jul 22, 1956 Sex: male   REASON FOR VISIT:    Follow up  HISOTRY OF PRESENT ILLNESS:    PLEAS CARNEAL is a 67 y.o. male who has undergone the following procedures:     09/05/2015: Right carotid endarterectomy (asymptomatic, [name]) 02/04/2022: DCB, right popliteal artery, PTA right anterior tibial artery (ulcers, [name]) 02/18/2022: DCB, left popliteal artery, PTA, left peroneal artery, atherectomy, left popliteal and peroneal artery (ulcers, Leona Alen) 04/15/2022: Angioplasty, right popliteal artery, angioplasty, right anterior tibial artery (ulcer, Dalesha Stanback) 04/16/2022: Right fifth toe amputation [name]) 05/09/2022: Right below-knee amputation [name]) 06/20/2022: Revision right below-knee amputation [name])  I saw him in the office on 10/27/2022 with an ulcer on the left foot.  He underwent angiography on 10/28/2022.  He was found to have diffuse disease throughout the left below-knee popliteal artery which was heavily calcified with an 80-90% stenosis.  This was treated with shockwave lithotripsy followed by drug-coated balloon angioplasty.  I then performed angioplasty of the posterior tibial artery that had a proximal occlusion.  At the completion, the patient had inline flow through his popliteal artery with 3 vessels down to the ankle.  There was diffuse disease out onto the foot.  He feels that the left leg is much better.  He has been wearing compression socks which does help with the edema.        PAST MEDICAL HISTORY:   Past Medical History:  Diagnosis Date   Carotid artery occlusion    Cellulitis    Erectile dysfunction    Hyperlipidemia    Hypertension    Peripheral vascular disease      FAMILY HISTORY:   Family History  Problem Relation Age of Onset   Other Mother        circulatory problems   Heart attack Mother    Deep vein  thrombosis Mother    Heart disease Mother    Hyperlipidemia Father    Hypertension Father    Diabetes Father    Deep vein thrombosis Father    Heart disease Father        before age 43   Heart attack Father    Peripheral vascular disease Father    Hypertension Brother    Hyperlipidemia Brother     SOCIAL HISTORY:   Social History   Tobacco Use   Smoking status: Never   Smokeless tobacco: Former    Types: Chew    Quit date: 01/01/2015  Substance Use Topics   Alcohol use: Not Currently     ALLERGIES:   Allergies  Allergen Reactions   Penicillins Rash    Has patient had a PCN reaction causing immediate rash, facial/tongue/throat swelling, SOB or lightheadedness with hypotension: Yes Has patient had a PCN reaction causing severe rash involving mucus membranes or skin necrosis: No Has patient had a PCN reaction that required hospitalization No Has patient had a PCN reaction occurring within the last 10 years: No If all of the above answers are "NO", then may proceed with Cephalosporin use.  Tolerated Cefazolin on 04/16/22    Sulfa Antibiotics Swelling and Other (See Comments)    Facial/lip swelling     CURRENT MEDICATIONS:   Current Outpatient Medications  Medication Sig Dispense Refill   amLODipine (NORVASC) 2.5 MG tablet Take 2.5 mg by mouth at bedtime.     ascorbic acid (VITAMIN C) 500 MG tablet Take 500 mg by mouth daily.  aspirin EC 81 MG tablet Take 81 mg by mouth at bedtime.     atorvastatin (LIPITOR) 80 MG tablet Take 80 mg by mouth at bedtime.     clopidogrel (PLAVIX) 75 MG tablet Take 1 tablet (75 mg total) by mouth daily. (Patient taking differently: Take 75 mg by mouth at bedtime.) 30 tablet 11   irbesartan-hydrochlorothiazide (AVALIDE) 300-12.5 MG tablet Take 1 tablet by mouth at bedtime.     Multiple Vitamin (MULTIVITAMIN) capsule Take 2 capsules by mouth at bedtime.     Omega-3 Fatty Acids (OMEGA 3 PO) Take 3 capsules by mouth daily. 454 mg each      Probiotic Product (PROBIOTIC PO) Take 1 capsule by mouth daily.     zinc sulfate 220 (50 Zn) MG capsule Take 1 capsule (220 mg total) by mouth daily.     No current facility-administered medications for this visit.    REVIEW OF SYSTEMS:   [X]  denotes positive finding, [ ]  denotes negative finding Cardiac  Comments:  Chest pain or chest pressure:    Shortness of breath upon exertion:    Short of breath when lying flat:    Irregular heart rhythm:        Vascular    Pain in calf, thigh, or hip brought on by ambulation:    Pain in feet at night that wakes you up from your sleep:     Blood clot in your veins:    Leg swelling:         Pulmonary    Oxygen at home:    Productive cough:     Wheezing:         Neurologic    Sudden weakness in arms or legs:     Sudden numbness in arms or legs:     Sudden onset of difficulty speaking or slurred speech:    Temporary loss of vision in one eye:     Problems with dizziness:         Gastrointestinal    Blood in stool:     Vomited blood:         Genitourinary    Burning when urinating:     Blood in urine:        Psychiatric    Major depression:         Hematologic    Bleeding problems:    Problems with blood clotting too easily:        Skin    Rashes or ulcers:        Constitutional    Fever or chills:      PHYSICAL EXAM:   Vitals:   11/24/22 0830  BP: (!) 155/69  Pulse: (!) 55  Resp: 20  Temp: 98 F (36.7 C)  SpO2: 96%  Weight: 193 lb (87.5 kg)  Height: 5\' 10"  (1.778 m)    GENERAL: The patient is a well-nourished male, in no acute distress. The vital signs are documented above. CARDIAC: There is a regular rate and rhythm.  PULMONARY: Non-labored respirations MUSCULOSKELETAL: There are no major deformities or cyanosis. NEUROLOGIC: No focal weakness or paresthesias are detected. SKIN: There are no ulcers or rashes noted. PSYCHIATRIC: The patient has a normal affect.  STUDIES:   I have reviewed the  following: +-------+-----------+-----------+------------+------------+  ABI/TBIToday's ABIToday's TBIPrevious ABIPrevious TBI  +-------+-----------+-----------+------------+------------+  Right BKA                   BKA                       +-------+-----------+-----------+------------+------------+  Left  0.72       0.11       0.52        0.1           +-------+-----------+-----------+------------+------------+  Left toe pressure:  17 Monophasic waveforms  Left: No evidence of stenosis.  No evidence of occlusion.  Proximal and mid calf PTA and peroneal artery were not well visualized.  ATA collateral at the ankle (60 cm/s).   MEDICAL ISSUES:   PAD with ulcer: He has been maximally revascularized at this point.  He has diffuse disease out onto the foot.  He will return in 3 months with surveillance imaging including his carotid    Charlena Cross, MD, FACS [facility] [phone] Pager [phone]

## 2022-11-25 ENCOUNTER — Encounter: Payer: Self-pay | Admitting: Physical Therapy

## 2022-11-25 ENCOUNTER — Ambulatory Visit (INDEPENDENT_AMBULATORY_CARE_PROVIDER_SITE_OTHER): Payer: 59 | Admitting: Physical Therapy

## 2022-11-25 DIAGNOSIS — R2689 Other abnormalities of gait and mobility: Secondary | ICD-10-CM

## 2022-11-25 DIAGNOSIS — M6281 Muscle weakness (generalized): Secondary | ICD-10-CM

## 2022-11-25 DIAGNOSIS — R2681 Unsteadiness on feet: Secondary | ICD-10-CM

## 2022-11-25 DIAGNOSIS — L98498 Non-pressure chronic ulcer of skin of other sites with other specified severity: Secondary | ICD-10-CM

## 2022-11-25 NOTE — Therapy (Signed)
OUTPATIENT PHYSICAL THERAPY TREATMENT   Patient Name: NORMA IGNASIAK MRN: [id number] DOB:[birth year], 67 y.o., male Today's Date: 11/25/2022  PCP: Darrow Bussing, MD REFERRING PROVIDER: Delle Reining, PA-C  END OF SESSION:  PT End of Session - 11/25/22 0759     Visit Number 9    Number of Visits 20    Date for PT Re-Evaluation 01/15/23    Authorization Type [employer]    Authorization Time Period 20 PT visits, 10% co-insurance    Authorization - Number of Visits 20    Progress Note Due on Visit 10    PT Start Time 0800    PT Stop Time 0845    PT Time Calculation (min) 45 min    Activity Tolerance Patient tolerated treatment well    Behavior During Therapy WFL for tasks assessed/performed                  Past Medical History:  Diagnosis Date   Carotid artery occlusion    Cellulitis    Erectile dysfunction    Hyperlipidemia    Hypertension    Peripheral vascular disease    Past Surgical History:  Procedure Laterality Date   ABDOMINAL AORTOGRAM W/LOWER EXTREMITY N/A 02/04/2022   Procedure: ABDOMINAL AORTOGRAM W/LOWER EXTREMITY;  Surgeon: Nada Libman, MD;  Location: [facility];  Service: Cardiovascular;  Laterality: N/A;   ABDOMINAL AORTOGRAM W/LOWER EXTREMITY N/A 02/18/2022   Procedure: ABDOMINAL AORTOGRAM W/LOWER EXTREMITY;  Surgeon: Nada Libman, MD;  Location: [facility];  Service: Cardiovascular;  Laterality: N/A;   ABDOMINAL AORTOGRAM W/LOWER EXTREMITY N/A 04/15/2022   Procedure: ABDOMINAL AORTOGRAM W/LOWER EXTREMITY;  Surgeon: Nada Libman, MD;  Location: [facility];  Service: Cardiovascular;  Laterality: N/A;   ABDOMINAL AORTOGRAM W/LOWER EXTREMITY N/A 10/28/2022   Procedure: ABDOMINAL AORTOGRAM W/LOWER EXTREMITY;  Surgeon: Nada Libman, MD;  Location: [facility];  Service: Cardiovascular;  Laterality: N/A;   AMPUTATION Right 04/16/2022   Procedure: RIGHT 5TH RAY AMPUTATION;  Surgeon: Nadara Mustard, MD;  Location: [facility]  OR;  Service: Orthopedics;  Laterality: Right;   AMPUTATION Right 05/09/2022   Procedure: RIGHT BELOW KNEE AMPUTATION;  Surgeon: Nadara Mustard, MD;  Location: [facility] OR;  Service: Orthopedics;  Laterality: Right;   APPLICATION OF WOUND VAC Right 06/20/2022   Procedure: APPLICATION OF WOUND VAC;  Surgeon: Nadara Mustard, MD;  Location: [facility];  Service: Orthopedics;  Laterality: Right;   COLONOSCOPY     ENDARTERECTOMY Right 09/05/2015   Procedure: ENDARTERECTOMY CAROTID RIGHT;  Surgeon: Sherren Kerns, MD;  Location: [facility] OR;  Service: Vascular;  Laterality: Right;   PATCH ANGIOPLASTY  09/05/2015   Procedure: PATCH ANGIOPLASTY RIGHT CAROTID ARTERY USING HEMASHIELD PLATINUM FINESSE PATCH;  Surgeon: Sherren Kerns, MD;  Location: [facility] OR;  Service: Vascular;;   PERIPHERAL INTRAVASCULAR LITHOTRIPSY Left 10/28/2022   Procedure: INTRAVASCULAR LITHOTRIPSY;  Surgeon: Nada Libman, MD;  Location: [facility];  Service: Cardiovascular;  Laterality: Left;  L peroneal   PERIPHERAL VASCULAR ATHERECTOMY Left 02/18/2022   Procedure: PERIPHERAL VASCULAR ATHERECTOMY;  Surgeon: Nada Libman, MD;  Location: [facility];  Service: Cardiovascular;  Laterality: Left;  Popliteal and Peroneal   PERIPHERAL VASCULAR BALLOON ANGIOPLASTY  02/04/2022   Procedure: PERIPHERAL VASCULAR BALLOON ANGIOPLASTY;  Surgeon: Nada Libman, MD;  Location: [facility];  Service: Cardiovascular;;   PERIPHERAL VASCULAR BALLOON ANGIOPLASTY Right 04/15/2022   Procedure: PERIPHERAL VASCULAR BALLOON ANGIOPLASTY;  Surgeon: Nada Libman, MD;  Location: [facility];  Service: Cardiovascular;  Laterality: Right;  SFA/POPLITEAL   PERIPHERAL VASCULAR BALLOON ANGIOPLASTY Left 10/28/2022   Procedure: PERIPHERAL VASCULAR BALLOON ANGIOPLASTY;  Surgeon: Nada Libman, MD;  Location: [facility];  Service: Cardiovascular;  Laterality: Left;  L peroneal and L pop   STUMP REVISION Right 06/20/2022   Procedure:  REVISION RIGHT BELOW KNEE AMPUTATION;  Surgeon: Nadara Mustard, MD;  Location: [facility] OR;  Service: Orthopedics;  Laterality: Right;   TONSILLECTOMY     VASECTOMY     Patient Active Problem List   Diagnosis Date Noted   Elevated coronary artery calcium score 08/06/2022   Dehiscence of amputation stump of right lower extremity 06/20/2022   Acute blood loss anemia 05/19/2022   Post-op pain 05/19/2022   Acute renal failure 05/19/2022   S/P BKA (below knee amputation), right 05/14/2022   Below-knee amputation of right lower extremity 05/09/2022   Gangrene of right foot    PAD (peripheral artery disease) 04/17/2022   Acute osteomyelitis of metatarsal bone of right foot    Atherosclerosis of native arteries of the extremities with ulceration 04/15/2022   Peripheral arterial disease 03/25/2022   Obesity 03/21/2022   Prediabetes 03/21/2022   Erectile dysfunction 07/26/2021   Family history of ischemic heart disease (IHD) 07/26/2021   Pure hypercholesterolemia 07/26/2021   History of adenomatous polyp of colon 02/03/2019   Internal hemorrhoids 02/03/2019   Carotid artery stenosis, asymptomatic 09/05/2015   Cellulitis    Hypertension    Hyperlipidemia    Occlusion and stenosis of carotid artery without mention of cerebral infarction 01/01/2012    ONSET DATE: 10/15/2022 prosthesis delivery  REFERRING DIAG: Z61.096E (ICD-10-CM) - Below-knee amputation of right lower extremity   THERAPY DIAG:  Other abnormalities of gait and mobility  Unsteadiness on feet  Non-pressure chronic ulcer of skin of other sites with other specified severity  Muscle weakness (generalized)  Rationale for Evaluation and Treatment: Rehabilitation  SUBJECTIVE:   SUBJECTIVE STATEMENT:  He saw [name] yesterday with good results.  He took prosthesis off Saturday & Sunday early 3-5pm instead of 8pm.  After sits >30-60 min, the initial standing hurts.   PERTINENT HISTORY: carotid artery occlusion, HTN, PVD, BLE  vascular sugeries  PAIN:  Are you having pain? No  PRECAUTIONS: None  WEIGHT BEARING RESTRICTIONS: No  FALLS: Has patient fallen in last 6 months? No  LIVING ENVIRONMENT: Lives with: lives with their spouse Lives in: House  single level Home Access: Stairs to enter Home layout: One level Stairs: Yes: External: 3 steps; can reach both Has following equipment at home: Single point cane, Walker - 2 wheeled, Wheelchair (manual), shower chair, and knee scooter  PLOF: Independent, Independent with household mobility without device, and Independent with community mobility without device  circulation issues started in 2019 with worsening 2021,   PATIENT GOALS:   to use prosthesis to travel, golf, climb ladder, go to gym, active in community including coaching,    OBJECTIVE:  COGNITION: Overall cognitive status: Within functional limits for tasks assessed   SENSATION: WFL  POSTURE: flexed trunk  and weight shift left  LOWER EXTREMITY ROM:  ROM P:passive  A:active Right eval Left eval  Hip flexion    Hip extension    Hip abduction    Hip adduction    Hip internal rotation    Hip external rotation    Knee flexion [facility]   Knee extension [facility]  Ankle dorsiflexion    Ankle plantarflexion    Ankle inversion    Ankle eversion     (Blank rows = not tested)  LOWER EXTREMITY MMT:  MMT Right eval Left eval  Hip flexion 5/5 5/5  Hip extension 4/5   Hip abduction 4/5   Hip adduction    Hip internal rotation    Hip external rotation    Knee flexion 4/5   Knee extension 4/5   Ankle dorsiflexion    Ankle plantarflexion    Ankle inversion    Ankle eversion    (Blank rows = not tested)  TRANSFERS: 11/17/2022: sit to /from stand: independent without UE assist  10/22/2022 / Eval: Sit to stand: Modified independence Stand to sit: Modified independence  GAIT: 11/17/2022:  Pt amb community distances with cane including neg ramps & curbs safely.  Pt amb up to 200' (residual  limb pain limits distances) without device safely with verbal cues only to correct deviations. Gait Velocity: self-selected 2.13 ft/sec & fast pace 2.67 ft/sec Functional Gait Assessment: 10/30  10/22/2022 / Eval: Gait pattern: step to pattern, decreased step length- Left, decreased stance time- Right, decreased hip/knee flexion- Right, Right hip hike, knee flexed in stance- Right, antalgic, trunk flexed, and abducted- Right Distance walked: 50' Assistive device utilized: Environmental consultant - 2 wheeled and TTA prosthesis Level of assistance: SBA / cues for deviations no balance issues noted with RW Comments: due to wound issues PT recommending RW for BUE support initially  FUNCTIONAL TESTs:  11/17/2022: Sharlene Motts Balance 50/56 & Functional Gait Assessment 10/30   Eval / 10/22/2022:  Sharlene Motts Balance Scale: 36/56  CURRENT PROSTHETIC WEAR ASSESSMENT: 11/17/2022: Pt wearing prosthesis most of awake hours except bathing & last 1-1.5 hours of day. Wound scab at distal tibia. PT continues to advise / problem-solve limb pain & other prosthetic issues.    Eval / 10/22/2022: Patient is dependent with: skin check, residual limb care, prosthetic cleaning, ply sock cleaning, correct ply sock adjustment, proper wear schedule/adjustment, and proper weight-bearing schedule/adjustment Donning prosthesis: SBA Doffing prosthesis: Modified independence Prosthetic wear tolerance: 1-1.5 hours, 2x/day, for last 2 days.  He wore prosthesis 1hr 2x on 3/8 and got water blisters.  Stopped wear on 3/9 & 3/10, resumed 3/11 1.5 hrs 2x/day for 2 days.  Prosthetic weight bearing tolerance: 5 minutes Edema: pitting Residual limb condition: draining (serous) wound at distal tibia, 2nd open blister on distal limb, minimal hair growth, cylindrical shape, no warmth or abnormal color changes noted.  Prosthetic description: silicon liner with pin lock suspension, total contact socket with flexible inner liner, 0-ply socks, dynamic response foot K  code/activity level with prosthetic use: Level 3    TODAY'S TREATMENT:                                                                                                                             DATE:  11/25/2022: Prosthetic Training with TTA prosthesis: Pt's wife in attendance with him  today.   Pt ordered incorrect Vivewear socks. PT used Vivewear website to show his wife underliners (liner liner). She verbalized understanding.  PT reviewed scar mobilization with demo / verbal cues & recommended performing any time out of prosthesis & shrinker like transition. Also cleaning & drying invaginated area. Pt's limb continues to have scab at distal tibia with mild adherence.  His limb soreness is at distal medial tibia.  Pt is seeing prosthetist tomorrow. PT recommending window at this sore area and alignment. Pt to ambulate without device focusing on step width & wt shift forward over prosthesis when prosthetist is observing for alignment. Pt & wife verbalized understanding.  PT demo how pt changes prosthetic stance duration with pain. PT explained weight bearing varies with UE support, distance & frequency.  He needs to progress WBing based on limb pain. Walking inside office & home without device.  Begin to carry cane for basic community until notices decreased stance time then use the cane. Pt & wife verbalized understanding.  PT instructed with demo, verbal & tactile cues on weight shifting onto prosthesis for 30-60 sec prior to taking first step. This will seat limb into socket better.  PT noted improved prosthetic stance duration with this activity prior to walking. Pt verbalized understanding.  Pt amb 300' working on step width & left heel rise prior to LLE stepping for wt shift over prosthesis.   PT & pt discussed reducing frequency to 1x/wk with PT instructing what to do outside of PT. Pt to consider with further discussion with 2nd PT appt this week. Would start next week if seems appropriate.      11/20/2022: Prosthetic Training with TTA prosthesis: PT showed pt Vivewear website to purchase more underliners (liner liner).  Circumferential 31.5cm indicates size III/large. Pt verbalized understanding. PT reviewed recommendation for scar mobilization. Pt verbalized understanding.  PT worked on functional balance tossing ball ~10' and swinging racket similar to baseball with minor balance loss that he self corrected.   Pt amb without device working on scanning up/down, right/left & diagonals maintaining path & pace.  Worked on ability to walk in dark by alternating eyes open 3 steps / eyes closed 3 steps.  He improved both gait skills with instruction. Pt neg ramp & curb without device with supervision & cues.    11/17/2022: Prosthetic Training with TTA prosthesis: See Functional Gait Assessment, gait, balance & prosthetic care/ use. PT instructed in stepping over obstacles:  6" height forward facing & 10" height side stepping.  Pt able to perform with supervision.  Picking up pace with longer & quicker steps. Pt able to perform for 3 steps intermittently with supervision. Stairs alternating pattern for flight 1 steps.  First flight with 2 rails, then 2nd & 3rd with contralateral rail.  PT cues & supervision.  Pt verbalizes PT recommendation for checking limb / liner for dampness every 4 hours.  Work on above functional activities.     PATIENT EDUCATION: PATIENT EDUCATED ON FOLLOWING PROSTHETIC CARE: Education details: PT instructed in use of Vivewear under liner with prosthesis wear. When prosthesis is off, Vivewear & tubular compression shrinker.   Skin check, Residual limb care, Prosthetic cleaning, Correct ply sock adjustment, Propper donning, Proper wear schedule/adjustment, and Proper weight-bearing schedule/adjustment Prosthetic wear tolerance: 1.5 hours 3x/day, 7 days/week with plans to increase every 5-7 days depending on issues. Person educated: Patient and Spouse Education  method: Explanation, Demonstration, Tactile cues, and Verbal cues Education comprehension: verbalized understanding, verbal cues required, tactile cues  required, and needs further education  HOME EXERCISE PROGRAM: HEP 11/04/2022 with PT demo & verbal cues. Pt & wife verbalized understanding with Pt return demo.  1.  Sidestepping right and left along counter with cues on engaging bilateral lower extremities. 10' 3 laps.  2.  Counter RUE and cane LUE -walking forward with equal step length using heel toe as marker and walking backwards with weight shift over the prosthesis in stance. 10' 3 laps.   3.  Weight shift over RLE in stance with tightening hip muscles for stabilization tapping left forefoot in lower cabinet. 10 reps 2 sets.  4. Sit to / from stand 18" chair without armrest using upper extremities lightly on seat.  PT demo and verbal cues on technique to decrease upper extremity support and increased lower extremity utilization.  10 reps 2 sets.  5.  Picking up washcloth from floor. PT demo & verbal cues on technique with TTA prosthesis and set-up for safety.  10 reps 2 sets.   ASSESSMENT:  CLINICAL IMPRESSION: Patient improved stance duration on prosthesis with PT instruction in weight shift upon arising and proper weight shift over prosthesis in stance.  Limb pain at distal medial tibia continues to be primary limiting factor.   OBJECTIVE IMPAIRMENTS: Abnormal gait, decreased activity tolerance, decreased balance, decreased knowledge of condition, decreased knowledge of use of DME, decreased mobility, difficulty walking, decreased strength, increased edema, prosthetic dependency , and wound on residual limb .   ACTIVITY LIMITATIONS: carrying, lifting, bending, standing, squatting, stairs, transfers, locomotion level, and prosthesis use  PARTICIPATION LIMITATIONS: meal prep, cleaning, driving, community activity, occupation, yard work, and recreational activities.  PERSONAL FACTORS:  Fitness, Time since onset of injury/illness/exacerbation, and 3+ comorbidities: see PMH  are also affecting patient's functional outcome.   REHAB POTENTIAL: Good  CLINICAL DECISION MAKING: Evolving/moderate complexity  EVALUATION COMPLEXITY: Moderate   GOALS: Goals reviewed with patient? Yes  SHORT TERM GOALS: Target date: 11/20/2022  Patient donnes prosthesis modified independent & verbalizes proper cleaning. Baseline: SEE OBJECTIVE DATA Goal status: MET 11/17/2022 2.  Patient tolerates prosthesis >10 hrs total /day without skin issues increasing and no limb pain. Baseline: SEE OBJECTIVE DATA Goal status: MET 11/17/2022  3.  Berg >/= 45/56. Baseline: SEE OBJECTIVE DATA Goal status: MET 11/17/2022  4. Patient ambulates 100' with cane & prosthesis with supervision. Baseline: SEE OBJECTIVE DATA Goal status: MET 11/17/2022  5. Patient negotiates ramps & curbs with cane & prosthesis with minA. Baseline: SEE OBJECTIVE DATA Goal status: MET 11/17/2022  LONG TERM GOALS: Target date: 01/15/2023  Patient demonstrates & verbalized understanding of prosthetic care to enable safe utilization of prosthesis. Baseline: SEE OBJECTIVE DATA Goal status: Ongoing 10/27/2022  Patient tolerates prosthesis wear >90% of awake hours without skin or limb pain issues. Baseline: SEE OBJECTIVE DATA Goal status: Ongoing 10/27/2022  Functional Gait Assessment >/= 19/30 to indicate lower fall risk Baseline: SEE OBJECTIVE DATA Goal status: Ongoing 10/27/2022  Patient ambulates >500' with prosthesis only independently Baseline: SEE OBJECTIVE DATA Goal status: Ongoing 10/27/2022  Patient negotiates ramps, curbs & stairs with single rail with prosthesis only independently. Baseline: SEE OBJECTIVE DATA Goal status: Ongoing 10/27/2022  Patient demonstrates & verbalizes ability to perform yard work & golf with prosthesis only safely. Baseline: SEE OBJECTIVE DATA Goal status: Ongoing 10/27/2022   PLAN:  PT  FREQUENCY: 1-2x/week  PT DURATION: 12 weeks  PLANNED INTERVENTIONS: Therapeutic exercises, Therapeutic activity, Neuromuscular re-education, Balance training, Gait training, Patient/Family education, Self Care, Stair training, Prosthetic training, Re-evaluation, and  physical performance testing.  PLAN FOR NEXT SESSION: Do 10th visit note, discuss reducing frequency to 1x/wk with instruction for activities to work on over the following week,  progress gait activities to include components of FGA,  continue check limb and review prosthetic care issues, therapeutic exercise & balance activities.   Vladimir Faster, PT, DPT 11/25/2022, 9:00 AM

## 2022-11-26 ENCOUNTER — Encounter: Payer: 59 | Admitting: Physical Therapy

## 2022-11-27 ENCOUNTER — Encounter: Payer: Self-pay | Admitting: Physical Therapy

## 2022-11-27 ENCOUNTER — Ambulatory Visit (INDEPENDENT_AMBULATORY_CARE_PROVIDER_SITE_OTHER): Payer: 59 | Admitting: Physical Therapy

## 2022-11-27 DIAGNOSIS — M6281 Muscle weakness (generalized): Secondary | ICD-10-CM

## 2022-11-27 DIAGNOSIS — L98498 Non-pressure chronic ulcer of skin of other sites with other specified severity: Secondary | ICD-10-CM | POA: Diagnosis not present

## 2022-11-27 DIAGNOSIS — R2681 Unsteadiness on feet: Secondary | ICD-10-CM | POA: Diagnosis not present

## 2022-11-27 DIAGNOSIS — R2689 Other abnormalities of gait and mobility: Secondary | ICD-10-CM | POA: Diagnosis not present

## 2022-11-27 NOTE — Therapy (Signed)
OUTPATIENT PHYSICAL THERAPY TREATMENT & PROGRESS NOTE   Patient Name: Clinton [name] MRN: [id number] DOB:[birth year], 67 y.o., male Today's Date: 11/27/2022  PCP: Darrow Bussing, MD REFERRING PROVIDER: Delle Reining, PA-C  Progress Note Reporting Period 10/22/2022 to 11/27/2022  See note below for Objective Data and Assessment of Progress/Goals.      END OF SESSION:  PT End of Session - 11/27/22 0757     Visit Number 10    Number of Visits 20    Date for PT Re-Evaluation 01/15/23    Authorization Type [employer]    Authorization Time Period 20 PT visits, 10% co-insurance    Authorization - Number of Visits 20    Progress Note Due on Visit 20    PT Start Time 0757    PT Stop Time 0841    PT Time Calculation (min) 44 min    Activity Tolerance Patient tolerated treatment well    Behavior During Therapy WFL for tasks assessed/performed                   Past Medical History:  Diagnosis Date   Carotid artery occlusion    Cellulitis    Erectile dysfunction    Hyperlipidemia    Hypertension    Peripheral vascular disease    Past Surgical History:  Procedure Laterality Date   ABDOMINAL AORTOGRAM W/LOWER EXTREMITY N/A 02/04/2022   Procedure: ABDOMINAL AORTOGRAM W/LOWER EXTREMITY;  Surgeon: Nada Libman, MD;  Location: [facility];  Service: Cardiovascular;  Laterality: N/A;   ABDOMINAL AORTOGRAM W/LOWER EXTREMITY N/A 02/18/2022   Procedure: ABDOMINAL AORTOGRAM W/LOWER EXTREMITY;  Surgeon: Nada Libman, MD;  Location: [facility];  Service: Cardiovascular;  Laterality: N/A;   ABDOMINAL AORTOGRAM W/LOWER EXTREMITY N/A 04/15/2022   Procedure: ABDOMINAL AORTOGRAM W/LOWER EXTREMITY;  Surgeon: Nada Libman, MD;  Location: [facility];  Service: Cardiovascular;  Laterality: N/A;   ABDOMINAL AORTOGRAM W/LOWER EXTREMITY N/A 10/28/2022   Procedure: ABDOMINAL AORTOGRAM W/LOWER EXTREMITY;  Surgeon: Nada Libman, MD;  Location: [facility];   Service: Cardiovascular;  Laterality: N/A;   AMPUTATION Right 04/16/2022   Procedure: RIGHT 5TH RAY AMPUTATION;  Surgeon: Nadara Mustard, MD;  Location: [facility];  Service: Orthopedics;  Laterality: Right;   AMPUTATION Right 05/09/2022   Procedure: RIGHT BELOW KNEE AMPUTATION;  Surgeon: Nadara Mustard, MD;  Location: [facility] OR;  Service: Orthopedics;  Laterality: Right;   APPLICATION OF WOUND VAC Right 06/20/2022   Procedure: APPLICATION OF WOUND VAC;  Surgeon: Nadara Mustard, MD;  Location: [facility];  Service: Orthopedics;  Laterality: Right;   COLONOSCOPY     ENDARTERECTOMY Right 09/05/2015   Procedure: ENDARTERECTOMY CAROTID RIGHT;  Surgeon: Sherren Kerns, MD;  Location: [facility] OR;  Service: Vascular;  Laterality: Right;   PATCH ANGIOPLASTY  09/05/2015   Procedure: PATCH ANGIOPLASTY RIGHT CAROTID ARTERY USING HEMASHIELD PLATINUM FINESSE PATCH;  Surgeon: Sherren Kerns, MD;  Location: [facility] OR;  Service: Vascular;;   PERIPHERAL INTRAVASCULAR LITHOTRIPSY Left 10/28/2022   Procedure: INTRAVASCULAR LITHOTRIPSY;  Surgeon: Nada Libman, MD;  Location: [facility];  Service: Cardiovascular;  Laterality: Left;  L peroneal   PERIPHERAL VASCULAR ATHERECTOMY Left 02/18/2022   Procedure: PERIPHERAL VASCULAR ATHERECTOMY;  Surgeon: Nada Libman, MD;  Location: [facility];  Service: Cardiovascular;  Laterality: Left;  Popliteal and Peroneal   PERIPHERAL VASCULAR BALLOON ANGIOPLASTY  02/04/2022   Procedure: PERIPHERAL VASCULAR BALLOON ANGIOPLASTY;  Surgeon: Myra Gianotti,  Fran Lowes, MD;  Location: [facility];  Service: Cardiovascular;;   PERIPHERAL VASCULAR BALLOON ANGIOPLASTY Right 04/15/2022   Procedure: PERIPHERAL VASCULAR BALLOON ANGIOPLASTY;  Surgeon: Nada Libman, MD;  Location: [facility];  Service: Cardiovascular;  Laterality: Right;  SFA/POPLITEAL   PERIPHERAL VASCULAR BALLOON ANGIOPLASTY Left 10/28/2022   Procedure: PERIPHERAL VASCULAR BALLOON ANGIOPLASTY;  Surgeon: Nada Libman,  MD;  Location: [facility];  Service: Cardiovascular;  Laterality: Left;  L peroneal and L pop   STUMP REVISION Right 06/20/2022   Procedure: REVISION RIGHT BELOW KNEE AMPUTATION;  Surgeon: Nadara Mustard, MD;  Location: [facility] OR;  Service: Orthopedics;  Laterality: Right;   TONSILLECTOMY     VASECTOMY     Patient Active Problem List   Diagnosis Date Noted   Elevated coronary artery calcium score 08/06/2022   Dehiscence of amputation stump of right lower extremity 06/20/2022   Acute blood loss anemia 05/19/2022   Post-op pain 05/19/2022   Acute renal failure 05/19/2022   S/P BKA (below knee amputation), right 05/14/2022   Below-knee amputation of right lower extremity 05/09/2022   Gangrene of right foot    PAD (peripheral artery disease) 04/17/2022   Acute osteomyelitis of metatarsal bone of right foot    Atherosclerosis of native arteries of the extremities with ulceration 04/15/2022   Peripheral arterial disease 03/25/2022   Obesity 03/21/2022   Prediabetes 03/21/2022   Erectile dysfunction 07/26/2021   Family history of ischemic heart disease (IHD) 07/26/2021   Pure hypercholesterolemia 07/26/2021   History of adenomatous polyp of colon 02/03/2019   Internal hemorrhoids 02/03/2019   Carotid artery stenosis, asymptomatic 09/05/2015   Cellulitis    Hypertension    Hyperlipidemia    Occlusion and stenosis of carotid artery without mention of cerebral infarction 01/01/2012    ONSET DATE: 10/15/2022 prosthesis delivery  REFERRING DIAG: Z61.096E (ICD-10-CM) - Below-knee amputation of right lower extremity   THERAPY DIAG:  Other abnormalities of gait and mobility  Unsteadiness on feet  Non-pressure chronic ulcer of skin of other sites with other specified severity  Muscle weakness (generalized)  Rationale for Evaluation and Treatment: Rehabilitation  SUBJECTIVE:   SUBJECTIVE STATEMENT:  He saw prosthetist yesterday who blew out inner flexible socket but no window in  laminated socket.  It feels better.  CPO also did a dynamic alignment.   PERTINENT HISTORY: carotid artery occlusion, HTN, PVD, BLE vascular sugeries  PAIN:  Are you having pain? No  PRECAUTIONS: None  WEIGHT BEARING RESTRICTIONS: No  FALLS: Has patient fallen in last 6 months? No  LIVING ENVIRONMENT: Lives with: lives with their spouse Lives in: House  single level Home Access: Stairs to enter Home layout: One level Stairs: Yes: External: 3 steps; can reach both Has following equipment at home: Single point cane, Walker - 2 wheeled, Wheelchair (manual), shower chair, and knee scooter  PLOF: Independent, Independent with household mobility without device, and Independent with community mobility without device  circulation issues started in 2019 with worsening 2021,   PATIENT GOALS:   to use prosthesis to travel, golf, climb ladder, go to gym, active in community including coaching,    OBJECTIVE:  COGNITION: Overall cognitive status: Within functional limits for tasks assessed   SENSATION: WFL  POSTURE: flexed trunk  and weight shift left  LOWER EXTREMITY ROM:  ROM P:passive  A:active Right eval Left eval  Hip flexion    Hip extension    Hip abduction    Hip adduction  Hip internal rotation    Hip external rotation    Knee flexion WFL   Knee extension WFL   Ankle dorsiflexion    Ankle plantarflexion    Ankle inversion    Ankle eversion     (Blank rows = not tested)  LOWER EXTREMITY MMT:  MMT Right eval Left eval  Hip flexion 5/5 5/5  Hip extension 4/5   Hip abduction 4/5   Hip adduction    Hip internal rotation    Hip external rotation    Knee flexion 4/5   Knee extension 4/5   Ankle dorsiflexion    Ankle plantarflexion    Ankle inversion    Ankle eversion    (Blank rows = not tested)  TRANSFERS: 11/17/2022: sit to /from stand: independent without UE assist  10/22/2022 / Eval: Sit to stand: Modified independence Stand to sit: Modified  independence  GAIT: 11/17/2022:  Pt amb community distances with cane including neg ramps & curbs safely.  Pt amb up to 200' (residual limb pain limits distances) without device safely with verbal cues only to correct deviations. Gait Velocity: self-selected 2.13 ft/sec & fast pace 2.67 ft/sec Functional Gait Assessment: 10/30  10/22/2022 / Eval: Gait pattern: step to pattern, decreased step length- Left, decreased stance time- Right, decreased hip/knee flexion- Right, Right hip hike, knee flexed in stance- Right, antalgic, trunk flexed, and abducted- Right Distance walked: 50' Assistive device utilized: Environmental consultant - 2 wheeled and TTA prosthesis Level of assistance: SBA / cues for deviations no balance issues noted with RW Comments: due to wound issues PT recommending RW for BUE support initially  FUNCTIONAL TESTs:  11/27/2022:   11/17/2022: Sharlene Motts Balance 50/56 & Functional Gait Assessment 10/30  Eval / 10/22/2022:  Sharlene Motts Balance Scale: 36/56  CURRENT PROSTHETIC WEAR ASSESSMENT: 11/17/2022: Pt wearing prosthesis most of awake hours except bathing & last 1-1.5 hours of day. Wound scab at distal tibia. PT continues to advise / problem-solve limb pain & other prosthetic issues.    Eval / 10/22/2022: Patient is dependent with: skin check, residual limb care, prosthetic cleaning, ply sock cleaning, correct ply sock adjustment, proper wear schedule/adjustment, and proper weight-bearing schedule/adjustment Donning prosthesis: SBA Doffing prosthesis: Modified independence Prosthetic wear tolerance: 1-1.5 hours, 2x/day, for last 2 days.  He wore prosthesis 1hr 2x on 3/8 and got water blisters.  Stopped wear on 3/9 & 3/10, resumed 3/11 1.5 hrs 2x/day for 2 days.  Prosthetic weight bearing tolerance: 5 minutes Edema: pitting Residual limb condition: draining (serous) wound at distal tibia, 2nd open blister on distal limb, minimal hair growth, cylindrical shape, no warmth or abnormal color changes noted.   Prosthetic description: silicon liner with pin lock suspension, total contact socket with flexible inner liner, 0-ply socks, dynamic response foot K code/activity level with prosthetic use: Level 3    TODAY'S TREATMENT:  DATE:  11/27/2022: Prosthetic Training with TTA prosthesis: Pt amb 300' X 3 without device working on weight shift with pelvis & decreasing lateral trunk lean.  Carried 10# wt in contralateral UE to facilitate trunk stabilization.  Walking with pulley wt resistance 10# both concentric / eccentric control with wt posterior, anterior, right & left 5 reps ea with seated rests between.  Pt continues to progress amb without device distance & frequency based on pain.   11/25/2022: Prosthetic Training with TTA prosthesis: Pt's wife in attendance with him today.   Pt ordered incorrect Vivewear socks. PT used Vivewear website to show his wife underliners (liner liner). She verbalized understanding.  PT reviewed scar mobilization with demo / verbal cues & recommended performing any time out of prosthesis & shrinker like transition. Also cleaning & drying invaginated area. Pt's limb continues to have scab at distal tibia with mild adherence.  His limb soreness is at distal medial tibia.  Pt is seeing prosthetist tomorrow. PT recommending window at this sore area and alignment. Pt to ambulate without device focusing on step width & wt shift forward over prosthesis when prosthetist is observing for alignment. Pt & wife verbalized understanding.  PT demo how pt changes prosthetic stance duration with pain. PT explained weight bearing varies with UE support, distance & frequency.  He needs to progress WBing based on limb pain. Walking inside office & home without device.  Begin to carry cane for basic community until notices decreased stance time then use the cane. Pt & wife  verbalized understanding.  PT instructed with demo, verbal & tactile cues on weight shifting onto prosthesis for 30-60 sec prior to taking first step. This will seat limb into socket better.  PT noted improved prosthetic stance duration with this activity prior to walking. Pt verbalized understanding.  Pt amb 300' working on step width & left heel rise prior to LLE stepping for wt shift over prosthesis.   PT & pt discussed reducing frequency to 1x/wk with PT instructing what to do outside of PT. Pt to consider with further discussion with 2nd PT appt this week. Would start next week if seems appropriate.     11/20/2022: Prosthetic Training with TTA prosthesis: PT showed pt Vivewear website to purchase more underliners (liner liner).  Circumferential 31.5cm indicates size III/large. Pt verbalized understanding. PT reviewed recommendation for scar mobilization. Pt verbalized understanding.  PT worked on functional balance tossing ball ~10' and swinging racket similar to baseball with minor balance loss that he self corrected.   Pt amb without device working on scanning up/down, right/left & diagonals maintaining path & pace.  Worked on ability to walk in dark by alternating eyes open 3 steps / eyes closed 3 steps.  He improved both gait skills with instruction. Pt neg ramp & curb without device with supervision & cues.    PATIENT EDUCATION: PATIENT EDUCATED ON FOLLOWING PROSTHETIC CARE: Education details: PT instructed in use of Vivewear under liner with prosthesis wear. When prosthesis is off, Vivewear & tubular compression shrinker.   Skin check, Residual limb care, Prosthetic cleaning, Correct ply sock adjustment, Propper donning, Proper wear schedule/adjustment, and Proper weight-bearing schedule/adjustment Prosthetic wear tolerance: 1.5 hours 3x/day, 7 days/week with plans to increase every 5-7 days depending on issues. Person educated: Patient and Spouse Education method: Explanation,  Demonstration, Tactile cues, and Verbal cues Education comprehension: verbalized understanding, verbal cues required, tactile cues required, and needs further education  HOME EXERCISE PROGRAM: HEP 11/04/2022 with PT demo & verbal cues.  Pt & wife verbalized understanding with Pt return demo.  1.  Sidestepping right and left along counter with cues on engaging bilateral lower extremities. 10' 3 laps.  2.  Counter RUE and cane LUE -walking forward with equal step length using heel toe as marker and walking backwards with weight shift over the prosthesis in stance. 10' 3 laps.   3.  Weight shift over RLE in stance with tightening hip muscles for stabilization tapping left forefoot in lower cabinet. 10 reps 2 sets.  4. Sit to / from stand 18" chair without armrest using upper extremities lightly on seat.  PT demo and verbal cues on technique to decrease upper extremity support and increased lower extremity utilization.  10 reps 2 sets.  5.  Picking up washcloth from floor. PT demo & verbal cues on technique with TTA prosthesis and set-up for safety.  10 reps 2 sets.   ASSESSMENT: CLINICAL IMPRESSION: Modification to socket appears to have decreased limb pain.  PT worked on pelvic wt shift & core stabilization which he improved.     OBJECTIVE IMPAIRMENTS: Abnormal gait, decreased activity tolerance, decreased balance, decreased knowledge of condition, decreased knowledge of use of DME, decreased mobility, difficulty walking, decreased strength, increased edema, prosthetic dependency , and wound on residual limb .   ACTIVITY LIMITATIONS: carrying, lifting, bending, standing, squatting, stairs, transfers, locomotion level, and prosthesis use  PARTICIPATION LIMITATIONS: meal prep, cleaning, driving, community activity, occupation, yard work, and recreational activities.  PERSONAL FACTORS: Fitness, Time since onset of injury/illness/exacerbation, and 3+ comorbidities: see PMH  are also affecting patient's  functional outcome.   REHAB POTENTIAL: Good  CLINICAL DECISION MAKING: Evolving/moderate complexity  EVALUATION COMPLEXITY: Moderate   GOALS: Goals reviewed with patient? Yes  SHORT TERM GOALS: Target date: 11/20/2022  Patient donnes prosthesis modified independent & verbalizes proper cleaning. Baseline: SEE OBJECTIVE DATA Goal status: MET 11/17/2022 2.  Patient tolerates prosthesis >10 hrs total /day without skin issues increasing and no limb pain. Baseline: SEE OBJECTIVE DATA Goal status: MET 11/17/2022  3.  Berg >/= 45/56. Baseline: SEE OBJECTIVE DATA Goal status: MET 11/17/2022  4. Patient ambulates 100' with cane & prosthesis with supervision. Baseline: SEE OBJECTIVE DATA Goal status: MET 11/17/2022  5. Patient negotiates ramps & curbs with cane & prosthesis with minA. Baseline: SEE OBJECTIVE DATA Goal status: MET 11/17/2022  LONG TERM GOALS: Target date: 01/15/2023  Patient demonstrates & verbalized understanding of prosthetic care to enable safe utilization of prosthesis. Baseline: SEE OBJECTIVE DATA Goal status: Ongoing 10/27/2022  Patient tolerates prosthesis wear >90% of awake hours without skin or limb pain issues. Baseline: SEE OBJECTIVE DATA Goal status: Ongoing 10/27/2022  Functional Gait Assessment >/= 19/30 to indicate lower fall risk Baseline: SEE OBJECTIVE DATA Goal status: Ongoing 10/27/2022  Patient ambulates >500' with prosthesis only independently Baseline: SEE OBJECTIVE DATA Goal status: Ongoing 10/27/2022  Patient negotiates ramps, curbs & stairs with single rail with prosthesis only independently. Baseline: SEE OBJECTIVE DATA Goal status: Ongoing 10/27/2022  Patient demonstrates & verbalizes ability to perform yard work & golf with prosthesis only safely. Baseline: SEE OBJECTIVE DATA Goal status: Ongoing 10/27/2022   PLAN:  PT FREQUENCY: 1-2x/week  PT DURATION: 12 weeks  PLANNED INTERVENTIONS: Therapeutic exercises, Therapeutic activity,  Neuromuscular re-education, Balance training, Gait training, Patient/Family education, Self Care, Stair training, Prosthetic training, Re-evaluation, and physical performance testing.  PLAN FOR NEXT SESSION: reduce frequency to 1x/wk with instruction for activities to work on over the following week,  progress gait activities to include  components of FGA,  continue check limb and review prosthetic care issues, therapeutic exercise & balance activities.   Vladimir Faster, PT, DPT 11/27/2022, 10:46 AM

## 2022-12-01 ENCOUNTER — Encounter: Payer: 59 | Admitting: Surgery

## 2022-12-01 ENCOUNTER — Encounter (HOSPITAL_COMMUNITY): Payer: 59

## 2022-12-01 ENCOUNTER — Encounter: Payer: 59 | Admitting: Physical Therapy

## 2022-12-02 ENCOUNTER — Other Ambulatory Visit: Payer: Self-pay

## 2022-12-02 ENCOUNTER — Encounter: Payer: 59 | Admitting: Physical Therapy

## 2022-12-02 DIAGNOSIS — Z8249 Family history of ischemic heart disease and other diseases of the circulatory system: Secondary | ICD-10-CM

## 2022-12-02 DIAGNOSIS — I70213 Atherosclerosis of native arteries of extremities with intermittent claudication, bilateral legs: Secondary | ICD-10-CM

## 2022-12-02 DIAGNOSIS — I6523 Occlusion and stenosis of bilateral carotid arteries: Secondary | ICD-10-CM

## 2022-12-02 DIAGNOSIS — I7025 Atherosclerosis of native arteries of other extremities with ulceration: Secondary | ICD-10-CM

## 2022-12-02 DIAGNOSIS — I739 Peripheral vascular disease, unspecified: Secondary | ICD-10-CM

## 2022-12-02 NOTE — Progress Notes (Signed)
Orders per Dr. Sumy Joseph placed for lab work. 

## 2022-12-03 ENCOUNTER — Encounter: Payer: 59 | Admitting: Physical Therapy

## 2022-12-04 ENCOUNTER — Encounter: Payer: 59 | Admitting: Physical Therapy

## 2022-12-09 ENCOUNTER — Encounter: Payer: Self-pay | Admitting: Physical Therapy

## 2022-12-09 ENCOUNTER — Ambulatory Visit (INDEPENDENT_AMBULATORY_CARE_PROVIDER_SITE_OTHER): Payer: 59 | Admitting: Physical Therapy

## 2022-12-09 ENCOUNTER — Encounter: Payer: 59 | Admitting: Physical Therapy

## 2022-12-09 DIAGNOSIS — R2681 Unsteadiness on feet: Secondary | ICD-10-CM | POA: Diagnosis not present

## 2022-12-09 DIAGNOSIS — L98498 Non-pressure chronic ulcer of skin of other sites with other specified severity: Secondary | ICD-10-CM

## 2022-12-09 DIAGNOSIS — R2689 Other abnormalities of gait and mobility: Secondary | ICD-10-CM

## 2022-12-09 DIAGNOSIS — M6281 Muscle weakness (generalized): Secondary | ICD-10-CM | POA: Diagnosis not present

## 2022-12-09 NOTE — Therapy (Signed)
OUTPATIENT PHYSICAL THERAPY TREATMENT   Patient Name: Clinton [name] MRN: [id number] DOB:05-29-1956, 67 y.o., male Today's Date: 12/09/2022  PCP: Darrow Bussing, MD REFERRING PROVIDER: Delle Reining, PA-C    END OF SESSION:  PT End of Session - 12/09/22 0759     Visit Number 11    Number of Visits 20    Date for PT Re-Evaluation 01/15/23    Authorization Type [employer]    Authorization Time Period 20 PT visits, 10% co-insurance    Authorization - Visit Number 11    Authorization - Number of Visits 20    Progress Note Due on Visit 20    PT Start Time 0759    PT Stop Time 0841    PT Time Calculation (min) 42 min    Activity Tolerance Patient tolerated treatment well    Behavior During Therapy [facility] for tasks assessed/performed                   Past Medical History:  Diagnosis Date   Carotid artery occlusion    Cellulitis    Erectile dysfunction    Hyperlipidemia    Hypertension    Peripheral vascular disease (HCC)    Past Surgical History:  Procedure Laterality Date   ABDOMINAL AORTOGRAM W/LOWER EXTREMITY N/A 02/04/2022   Procedure: ABDOMINAL AORTOGRAM W/LOWER EXTREMITY;  Surgeon: Nada Libman, MD;  Location: [facility];  Service: Cardiovascular;  Laterality: N/A;   ABDOMINAL AORTOGRAM W/LOWER EXTREMITY N/A 02/18/2022   Procedure: ABDOMINAL AORTOGRAM W/LOWER EXTREMITY;  Surgeon: Nada Libman, MD;  Location: [facility];  Service: Cardiovascular;  Laterality: N/A;   ABDOMINAL AORTOGRAM W/LOWER EXTREMITY N/A 04/15/2022   Procedure: ABDOMINAL AORTOGRAM W/LOWER EXTREMITY;  Surgeon: Nada Libman, MD;  Location: [facility];  Service: Cardiovascular;  Laterality: N/A;   ABDOMINAL AORTOGRAM W/LOWER EXTREMITY N/A 10/28/2022   Procedure: ABDOMINAL AORTOGRAM W/LOWER EXTREMITY;  Surgeon: Nada Libman, MD;  Location: [facility];  Service: Cardiovascular;  Laterality: N/A;   AMPUTATION Right 04/16/2022   Procedure: RIGHT 5TH RAY  AMPUTATION;  Surgeon: Nadara Mustard, MD;  Location: [facility] OR;  Service: Orthopedics;  Laterality: Right;   AMPUTATION Right 05/09/2022   Procedure: RIGHT BELOW KNEE AMPUTATION;  Surgeon: Nadara Mustard, MD;  Location: [facility] OR;  Service: Orthopedics;  Laterality: Right;   APPLICATION OF WOUND VAC Right 06/20/2022   Procedure: APPLICATION OF WOUND VAC;  Surgeon: Nadara Mustard, MD;  Location: [facility];  Service: Orthopedics;  Laterality: Right;   COLONOSCOPY     ENDARTERECTOMY Right 09/05/2015   Procedure: ENDARTERECTOMY CAROTID RIGHT;  Surgeon: Sherren Kerns, MD;  Location: [facility] OR;  Service: Vascular;  Laterality: Right;   PATCH ANGIOPLASTY  09/05/2015   Procedure: PATCH ANGIOPLASTY RIGHT CAROTID ARTERY USING HEMASHIELD PLATINUM FINESSE PATCH;  Surgeon: Sherren Kerns, MD;  Location: [facility] OR;  Service: Vascular;;   PERIPHERAL INTRAVASCULAR LITHOTRIPSY Left 10/28/2022   Procedure: INTRAVASCULAR LITHOTRIPSY;  Surgeon: Nada Libman, MD;  Location: [facility];  Service: Cardiovascular;  Laterality: Left;  L peroneal   PERIPHERAL VASCULAR ATHERECTOMY Left 02/18/2022   Procedure: PERIPHERAL VASCULAR ATHERECTOMY;  Surgeon: Nada Libman, MD;  Location: [facility];  Service: Cardiovascular;  Laterality: Left;  Popliteal and Peroneal   PERIPHERAL VASCULAR BALLOON ANGIOPLASTY  02/04/2022   Procedure: PERIPHERAL VASCULAR BALLOON ANGIOPLASTY;  Surgeon: Nada Libman, MD;  Location: [facility];  Service: Cardiovascular;;   PERIPHERAL  VASCULAR BALLOON ANGIOPLASTY Right 04/15/2022   Procedure: PERIPHERAL VASCULAR BALLOON ANGIOPLASTY;  Surgeon: Nada Libman, MD;  Location: [facility];  Service: Cardiovascular;  Laterality: Right;  SFA/POPLITEAL   PERIPHERAL VASCULAR BALLOON ANGIOPLASTY Left 10/28/2022   Procedure: PERIPHERAL VASCULAR BALLOON ANGIOPLASTY;  Surgeon: Nada Libman, MD;  Location: [facility];  Service: Cardiovascular;  Laterality: Left;  L peroneal and L pop    STUMP REVISION Right 06/20/2022   Procedure: REVISION RIGHT BELOW KNEE AMPUTATION;  Surgeon: Nadara Mustard, MD;  Location: [facility] OR;  Service: Orthopedics;  Laterality: Right;   TONSILLECTOMY     VASECTOMY     Patient Active Problem List   Diagnosis Date Noted   Elevated coronary artery calcium score 08/06/2022   Dehiscence of amputation stump of right lower extremity (HCC) 06/20/2022   Acute blood loss anemia 05/19/2022   Post-op pain 05/19/2022   Acute renal failure (HCC) 05/19/2022   S/P BKA (below knee amputation), right (HCC) 05/14/2022   Below-knee amputation of right lower extremity (HCC) 05/09/2022   Gangrene of right foot (HCC)    PAD (peripheral artery disease) (HCC) 04/17/2022   Acute osteomyelitis of metatarsal bone of right foot (HCC)    Atherosclerosis of native arteries of the extremities with ulceration (HCC) 04/15/2022   Peripheral arterial disease (HCC) 03/25/2022   Obesity 03/21/2022   Prediabetes 03/21/2022   Erectile dysfunction 07/26/2021   Family history of ischemic heart disease (IHD) 07/26/2021   Pure hypercholesterolemia 07/26/2021   History of adenomatous polyp of colon 02/03/2019   Internal hemorrhoids 02/03/2019   Carotid artery stenosis, asymptomatic 09/05/2015   Cellulitis    Hypertension    Hyperlipidemia    Occlusion and stenosis of carotid artery without mention of cerebral infarction 01/01/2012    ONSET DATE: 10/15/2022 prosthesis delivery  REFERRING DIAG: Z61.096E (ICD-10-CM) - Below-knee amputation of right lower extremity   THERAPY DIAG:  Other abnormalities of gait and mobility  Unsteadiness on feet  Non-pressure chronic ulcer of skin of other sites with other specified severity (HCC)  Muscle weakness (generalized)  Rationale for Evaluation and Treatment: Rehabilitation  SUBJECTIVE:   SUBJECTIVE STATEMENT:  This weekend he ran into 2 individuals with amputations.  He thinks the limb pain is better but still limits him.    PERTINENT HISTORY: carotid artery occlusion, HTN, PVD, BLE vascular sugeries  PAIN:  Are you having pain? Limb pain with weight bearing in last week 2/10 - 7-9/10 decreases by adjusting socks & using cane to limit WB decreases pain.   PRECAUTIONS: None  WEIGHT BEARING RESTRICTIONS: No  FALLS: Has patient fallen in last 6 months? No  LIVING ENVIRONMENT: Lives with: lives with their spouse Lives in: House  single level Home Access: Stairs to enter Home layout: One level Stairs: Yes: External: 3 steps; can reach both Has following equipment at home: Single point cane, Walker - 2 wheeled, Wheelchair (manual), shower chair, and knee scooter  PLOF: Independent, Independent with household mobility without device, and Independent with community mobility without device  circulation issues started in 2019 with worsening 2021,   PATIENT GOALS:   to use prosthesis to travel, golf, climb ladder, go to gym, active in community including coaching,    OBJECTIVE:  COGNITION: Overall cognitive status: Within functional limits for tasks assessed   SENSATION: WFL  POSTURE: flexed trunk  and weight shift left  LOWER EXTREMITY ROM:  ROM P:passive  A:active Right eval Left eval  Hip flexion  Hip extension    Hip abduction    Hip adduction    Hip internal rotation    Hip external rotation    Knee flexion WFL   Knee extension WFL   Ankle dorsiflexion    Ankle plantarflexion    Ankle inversion    Ankle eversion     (Blank rows = not tested)  LOWER EXTREMITY MMT:  MMT Right eval Left eval  Hip flexion 5/5 5/5  Hip extension 4/5   Hip abduction 4/5   Hip adduction    Hip internal rotation    Hip external rotation    Knee flexion 4/5   Knee extension 4/5   Ankle dorsiflexion    Ankle plantarflexion    Ankle inversion    Ankle eversion    (Blank rows = not tested)  TRANSFERS: 11/17/2022: sit to /from stand: independent without UE assist  10/22/2022 / Eval: Sit to  stand: Modified independence Stand to sit: Modified independence  GAIT: 11/17/2022:  Pt amb community distances with cane including neg ramps & curbs safely.  Pt amb up to 200' (residual limb pain limits distances) without device safely with verbal cues only to correct deviations. Gait Velocity: self-selected 2.13 ft/sec & fast pace 2.67 ft/sec Functional Gait Assessment: 10/30  10/22/2022 / Eval: Gait pattern: step to pattern, decreased step length- Left, decreased stance time- Right, decreased hip/knee flexion- Right, Right hip hike, knee flexed in stance- Right, antalgic, trunk flexed, and abducted- Right Distance walked: 50' Assistive device utilized: Environmental consultant - 2 wheeled and TTA prosthesis Level of assistance: SBA / cues for deviations no balance issues noted with RW Comments: due to wound issues PT recommending RW for BUE support initially  FUNCTIONAL TESTs:  11/27/2022:   11/17/2022: Sharlene Motts Balance 50/56 & Functional Gait Assessment 10/30  Eval / 10/22/2022:  Sharlene Motts Balance Scale: 36/56  CURRENT PROSTHETIC WEAR ASSESSMENT: 11/17/2022: Pt wearing prosthesis most of awake hours except bathing & last 1-1.5 hours of day. Wound scab at distal tibia. PT continues to advise / problem-solve limb pain & other prosthetic issues.    Eval / 10/22/2022: Patient is dependent with: skin check, residual limb care, prosthetic cleaning, ply sock cleaning, correct ply sock adjustment, proper wear schedule/adjustment, and proper weight-bearing schedule/adjustment Donning prosthesis: SBA Doffing prosthesis: Modified independence Prosthetic wear tolerance: 1-1.5 hours, 2x/day, for last 2 days.  He wore prosthesis 1hr 2x on 3/8 and got water blisters.  Stopped wear on 3/9 & 3/10, resumed 3/11 1.5 hrs 2x/day for 2 days.  Prosthetic weight bearing tolerance: 5 minutes Edema: pitting Residual limb condition: draining (serous) wound at distal tibia, 2nd open blister on distal limb, minimal hair growth, cylindrical  shape, no warmth or abnormal color changes noted.  Prosthetic description: silicon liner with pin lock suspension, total contact socket with flexible inner liner, 0-ply socks, dynamic response foot K code/activity level with prosthetic use: Level 3    TODAY'S TREATMENT:  DATE:  12/09/2022: Prosthetic Training with TTA prosthesis: PT reviewed pain management for limb pain. Use cane to limit weight bearing as needed working to build frequency & distance as tolerated.  Stand every 30 min even using timer during day.  PT instructed with demo & verbal cues on use of cutoff socks to increase fit more distally than knee area.  Pt verbalized understanding and reports improved comfort.   Neuromuscular Re-education: PT instructed verbal, demo & HO cues on HEP. Pt verbalized & return demo understanding.  Standing in corner hip width apart on foam pad.  check to make sure pelvis is centered over feet so eqaul weight on feet.  Keep your eyes open first 5 reps of each motion, then eyes closed 5 reps.  Slowly, look up toward ceiling and then down toward the floor as if you are nodding yes. look side to side as if you are shaking head slowly no look up-right to down-left diagonal motion look up-left to down-right diagonal motion   11/27/2022: Prosthetic Training with TTA prosthesis: Pt amb 300' X 3 without device working on weight shift with pelvis & decreasing lateral trunk lean.  Carried 10# wt in contralateral UE to facilitate trunk stabilization.  Walking with pulley wt resistance 10# both concentric / eccentric control with wt posterior, anterior, right & left 5 reps ea with seated rests between.  Pt continues to progress amb without device distance & frequency based on pain.   11/25/2022: Prosthetic Training with TTA prosthesis: Pt's wife in attendance with him today.   Pt  ordered incorrect Vivewear socks. PT used Vivewear website to show his wife underliners (liner liner). She verbalized understanding.  PT reviewed scar mobilization with demo / verbal cues & recommended performing any time out of prosthesis & shrinker like transition. Also cleaning & drying invaginated area. Pt's limb continues to have scab at distal tibia with mild adherence.  His limb soreness is at distal medial tibia.  Pt is seeing prosthetist tomorrow. PT recommending window at this sore area and alignment. Pt to ambulate without device focusing on step width & wt shift forward over prosthesis when prosthetist is observing for alignment. Pt & wife verbalized understanding.  PT demo how pt changes prosthetic stance duration with pain. PT explained weight bearing varies with UE support, distance & frequency.  He needs to progress WBing based on limb pain. Walking inside office & home without device.  Begin to carry cane for basic community until notices decreased stance time then use the cane. Pt & wife verbalized understanding.  PT instructed with demo, verbal & tactile cues on weight shifting onto prosthesis for 30-60 sec prior to taking first step. This will seat limb into socket better.  PT noted improved prosthetic stance duration with this activity prior to walking. Pt verbalized understanding.  Pt amb 300' working on step width & left heel rise prior to LLE stepping for wt shift over prosthesis.   PT & pt discussed reducing frequency to 1x/wk with PT instructing what to do outside of PT. Pt to consider with further discussion with 2nd PT appt this week. Would start next week if seems appropriate.    HOME EXERCISE PROGRAM: Access Code: [email or web address] URL: [email or web address] Date: 12/09/2022 Prepared by: Vladimir Faster  Exercises - Wide stance on Foam Pad head movements  - 1 x daily - 4 x weekly - 1 sets - 5-10 reps  HEP 11/04/2022 with PT demo & verbal cues. Pt & wife verbalized  understanding with Pt  return demo.  1.  Sidestepping right and left along counter with cues on engaging bilateral lower extremities. 10' 3 laps.  2.  Counter RUE and cane LUE -walking forward with equal step length using heel toe as marker and walking backwards with weight shift over the prosthesis in stance. 10' 3 laps.   3.  Weight shift over RLE in stance with tightening hip muscles for stabilization tapping left forefoot in lower cabinet. 10 reps 2 sets.  4. Sit to / from stand 18" chair without armrest using upper extremities lightly on seat.  PT demo and verbal cues on technique to decrease upper extremity support and increased lower extremity utilization.  10 reps 2 sets.  5.  Picking up washcloth from floor. PT demo & verbal cues on technique with TTA prosthesis and set-up for safety.  10 reps 2 sets.   ASSESSMENT: CLINICAL IMPRESSION: Pt appears to understand pain management using AD, standing every 30 min & use of cutoff socks.  PT instructed in HEP for balance & increasing use of prosthesis for stabilization.       OBJECTIVE IMPAIRMENTS: Abnormal gait, decreased activity tolerance, decreased balance, decreased knowledge of condition, decreased knowledge of use of DME, decreased mobility, difficulty walking, decreased strength, increased edema, prosthetic dependency , and wound on residual limb .   ACTIVITY LIMITATIONS: carrying, lifting, bending, standing, squatting, stairs, transfers, locomotion level, and prosthesis use  PARTICIPATION LIMITATIONS: meal prep, cleaning, driving, community activity, occupation, yard work, and recreational activities.  PERSONAL FACTORS: Fitness, Time since onset of injury/illness/exacerbation, and 3+ comorbidities: see PMH  are also affecting patient's functional outcome.   REHAB POTENTIAL: Good  CLINICAL DECISION MAKING: Evolving/moderate complexity  EVALUATION COMPLEXITY: Moderate   GOALS: Goals reviewed with patient? Yes  SHORT TERM GOALS:  Target date: 11/20/2022  Patient donnes prosthesis modified independent & verbalizes proper cleaning. Baseline: SEE OBJECTIVE DATA Goal status: MET 11/17/2022 2.  Patient tolerates prosthesis >10 hrs total /day without skin issues increasing and no limb pain. Baseline: SEE OBJECTIVE DATA Goal status: MET 11/17/2022  3.  Berg >/= 45/56. Baseline: SEE OBJECTIVE DATA Goal status: MET 11/17/2022  4. Patient ambulates 100' with cane & prosthesis with supervision. Baseline: SEE OBJECTIVE DATA Goal status: MET 11/17/2022  5. Patient negotiates ramps & curbs with cane & prosthesis with minA. Baseline: SEE OBJECTIVE DATA Goal status: MET 11/17/2022  LONG TERM GOALS: Target date: 01/15/2023  Patient demonstrates & verbalized understanding of prosthetic care to enable safe utilization of prosthesis. Baseline: SEE OBJECTIVE DATA Goal status: Ongoing 10/27/2022  Patient tolerates prosthesis wear >90% of awake hours without skin or limb pain issues. Baseline: SEE OBJECTIVE DATA Goal status: Ongoing 10/27/2022  Functional Gait Assessment >/= 19/30 to indicate lower fall risk Baseline: SEE OBJECTIVE DATA Goal status: Ongoing 10/27/2022  Patient ambulates >500' with prosthesis only independently Baseline: SEE OBJECTIVE DATA Goal status: Ongoing 10/27/2022  Patient negotiates ramps, curbs & stairs with single rail with prosthesis only independently. Baseline: SEE OBJECTIVE DATA Goal status: Ongoing 10/27/2022  Patient demonstrates & verbalizes ability to perform yard work & golf with prosthesis only safely. Baseline: SEE OBJECTIVE DATA Goal status: Ongoing 10/27/2022   PLAN:  PT FREQUENCY: 1-2x/week  PT DURATION: 12 weeks  PLANNED INTERVENTIONS: Therapeutic exercises, Therapeutic activity, Neuromuscular re-education, Balance training, Gait training, Patient/Family education, Self Care, Stair training, Prosthetic training, Re-evaluation, and physical performance testing.  PLAN FOR NEXT SESSION:  continue 1x/wk with instruction for activities to work on over the following week,  progress gait activities  to include components of FGA,  continue check limb and review prosthetic care issues, therapeutic exercise & balance activities.   Vladimir Faster, PT, DPT 12/09/2022, 8:49 AM

## 2022-12-15 ENCOUNTER — Ambulatory Visit: Payer: 59 | Admitting: Orthopedic Surgery

## 2022-12-16 ENCOUNTER — Ambulatory Visit (INDEPENDENT_AMBULATORY_CARE_PROVIDER_SITE_OTHER): Payer: 59 | Admitting: Physical Therapy

## 2022-12-16 ENCOUNTER — Encounter: Payer: 59 | Admitting: Physical Therapy

## 2022-12-16 ENCOUNTER — Encounter: Payer: Self-pay | Admitting: Physical Therapy

## 2022-12-16 DIAGNOSIS — R2689 Other abnormalities of gait and mobility: Secondary | ICD-10-CM | POA: Diagnosis not present

## 2022-12-16 DIAGNOSIS — M6281 Muscle weakness (generalized): Secondary | ICD-10-CM

## 2022-12-16 DIAGNOSIS — R2681 Unsteadiness on feet: Secondary | ICD-10-CM

## 2022-12-16 DIAGNOSIS — L98498 Non-pressure chronic ulcer of skin of other sites with other specified severity: Secondary | ICD-10-CM

## 2022-12-16 NOTE — Therapy (Signed)
OUTPATIENT PHYSICAL THERAPY TREATMENT   Patient Name: Clinton [name] MRN: [id number] DOB:06-13-1956, 67 y.o., male Today's Date: 12/16/2022  PCP: Darrow Bussing, MD REFERRING PROVIDER: Delle Reining, PA-C    END OF SESSION:  PT End of Session - 12/16/22 0758     Visit Number 12    Number of Visits 20    Date for PT Re-Evaluation 01/15/23    Authorization Type [employer]    Authorization Time Period 20 PT visits, 10% co-insurance    Authorization - Visit Number 12    Authorization - Number of Visits 20    Progress Note Due on Visit 20    PT Start Time 0759    PT Stop Time [phone]    PT Time Calculation (min) 39 min    Activity Tolerance Patient tolerated treatment well    Behavior During Therapy [facility] for tasks assessed/performed                   Past Medical History:  Diagnosis Date   Carotid artery occlusion    Cellulitis    Erectile dysfunction    Hyperlipidemia    Hypertension    Peripheral vascular disease (HCC)    Past Surgical History:  Procedure Laterality Date   ABDOMINAL AORTOGRAM W/LOWER EXTREMITY N/A 02/04/2022   Procedure: ABDOMINAL AORTOGRAM W/LOWER EXTREMITY;  Surgeon: Nada Libman, MD;  Location: [facility];  Service: Cardiovascular;  Laterality: N/A;   ABDOMINAL AORTOGRAM W/LOWER EXTREMITY N/A 02/18/2022   Procedure: ABDOMINAL AORTOGRAM W/LOWER EXTREMITY;  Surgeon: Nada Libman, MD;  Location: [facility];  Service: Cardiovascular;  Laterality: N/A;   ABDOMINAL AORTOGRAM W/LOWER EXTREMITY N/A 04/15/2022   Procedure: ABDOMINAL AORTOGRAM W/LOWER EXTREMITY;  Surgeon: Nada Libman, MD;  Location: [facility];  Service: Cardiovascular;  Laterality: N/A;   ABDOMINAL AORTOGRAM W/LOWER EXTREMITY N/A 10/28/2022   Procedure: ABDOMINAL AORTOGRAM W/LOWER EXTREMITY;  Surgeon: Nada Libman, MD;  Location: [facility];  Service: Cardiovascular;  Laterality: N/A;   AMPUTATION Right 04/16/2022   Procedure: RIGHT 5TH RAY  AMPUTATION;  Surgeon: Nadara Mustard, MD;  Location: [facility] OR;  Service: Orthopedics;  Laterality: Right;   AMPUTATION Right 05/09/2022   Procedure: RIGHT BELOW KNEE AMPUTATION;  Surgeon: Nadara Mustard, MD;  Location: [facility] OR;  Service: Orthopedics;  Laterality: Right;   APPLICATION OF WOUND VAC Right 06/20/2022   Procedure: APPLICATION OF WOUND VAC;  Surgeon: Nadara Mustard, MD;  Location: [facility];  Service: Orthopedics;  Laterality: Right;   COLONOSCOPY     ENDARTERECTOMY Right 09/05/2015   Procedure: ENDARTERECTOMY CAROTID RIGHT;  Surgeon: Sherren Kerns, MD;  Location: [facility] OR;  Service: Vascular;  Laterality: Right;   PATCH ANGIOPLASTY  09/05/2015   Procedure: PATCH ANGIOPLASTY RIGHT CAROTID ARTERY USING HEMASHIELD PLATINUM FINESSE PATCH;  Surgeon: Sherren Kerns, MD;  Location: [facility] OR;  Service: Vascular;;   PERIPHERAL INTRAVASCULAR LITHOTRIPSY Left 10/28/2022   Procedure: INTRAVASCULAR LITHOTRIPSY;  Surgeon: Nada Libman, MD;  Location: [facility];  Service: Cardiovascular;  Laterality: Left;  L peroneal   PERIPHERAL VASCULAR ATHERECTOMY Left 02/18/2022   Procedure: PERIPHERAL VASCULAR ATHERECTOMY;  Surgeon: Nada Libman, MD;  Location: [facility];  Service: Cardiovascular;  Laterality: Left;  Popliteal and Peroneal   PERIPHERAL VASCULAR BALLOON ANGIOPLASTY  02/04/2022   Procedure: PERIPHERAL VASCULAR BALLOON ANGIOPLASTY;  Surgeon: Nada Libman, MD;  Location: [facility];  Service: Cardiovascular;;   PERIPHERAL  VASCULAR BALLOON ANGIOPLASTY Right 04/15/2022   Procedure: PERIPHERAL VASCULAR BALLOON ANGIOPLASTY;  Surgeon: Nada Libman, MD;  Location: [facility];  Service: Cardiovascular;  Laterality: Right;  SFA/POPLITEAL   PERIPHERAL VASCULAR BALLOON ANGIOPLASTY Left 10/28/2022   Procedure: PERIPHERAL VASCULAR BALLOON ANGIOPLASTY;  Surgeon: Nada Libman, MD;  Location: [facility];  Service: Cardiovascular;  Laterality: Left;  L peroneal and L pop    STUMP REVISION Right 06/20/2022   Procedure: REVISION RIGHT BELOW KNEE AMPUTATION;  Surgeon: Nadara Mustard, MD;  Location: [facility] OR;  Service: Orthopedics;  Laterality: Right;   TONSILLECTOMY     VASECTOMY     Patient Active Problem List   Diagnosis Date Noted   Elevated coronary artery calcium score 08/06/2022   Dehiscence of amputation stump of right lower extremity (HCC) 06/20/2022   Acute blood loss anemia 05/19/2022   Post-op pain 05/19/2022   Acute renal failure (HCC) 05/19/2022   S/P BKA (below knee amputation), right (HCC) 05/14/2022   Below-knee amputation of right lower extremity (HCC) 05/09/2022   Gangrene of right foot (HCC)    PAD (peripheral artery disease) (HCC) 04/17/2022   Acute osteomyelitis of metatarsal bone of right foot (HCC)    Atherosclerosis of native arteries of the extremities with ulceration (HCC) 04/15/2022   Peripheral arterial disease (HCC) 03/25/2022   Obesity 03/21/2022   Prediabetes 03/21/2022   Erectile dysfunction 07/26/2021   Family history of ischemic heart disease (IHD) 07/26/2021   Pure hypercholesterolemia 07/26/2021   History of adenomatous polyp of colon 02/03/2019   Internal hemorrhoids 02/03/2019   Carotid artery stenosis, asymptomatic 09/05/2015   Cellulitis    Hypertension    Hyperlipidemia    Occlusion and stenosis of carotid artery without mention of cerebral infarction 01/01/2012    ONSET DATE: 10/15/2022 prosthesis delivery  REFERRING DIAG: Z61.096E (ICD-10-CM) - Below-knee amputation of right lower extremity   THERAPY DIAG:  Other abnormalities of gait and mobility  Unsteadiness on feet  Non-pressure chronic ulcer of skin of other sites with other specified severity (HCC)  Muscle weakness (generalized)  Rationale for Evaluation and Treatment: Rehabilitation  SUBJECTIVE:   SUBJECTIVE STATEMENT:  His limb is better that started on Friday.  He has been trying to get up more often. He is walking in office & home some  without cane.  It is sorer in mornings when first donning prosthesis. He continues to adjust ply socks.  He is wearing shrinker at night and it feels like proper size.    PERTINENT HISTORY: carotid artery occlusion, HTN, PVD, BLE vascular sugeries  PAIN:  Are you having pain? Limb pain with weight bearing in last week  1-2/10 - 7-8/10 decreases by adjusting socks & using cane to limit WB decreases pain.   PRECAUTIONS: None  WEIGHT BEARING RESTRICTIONS: No  FALLS: Has patient fallen in last 6 months? No  LIVING ENVIRONMENT: Lives with: lives with their spouse Lives in: House  single level Home Access: Stairs to enter Home layout: One level Stairs: Yes: External: 3 steps; can reach both Has following equipment at home: Single point cane, Walker - 2 wheeled, Wheelchair (manual), shower chair, and knee scooter  PLOF: Independent, Independent with household mobility without device, and Independent with community mobility without device  circulation issues started in 2019 with worsening 2021,   PATIENT GOALS:   to use prosthesis to travel, golf, climb ladder, go to gym, active in community including coaching,    OBJECTIVE:  COGNITION: Overall cognitive status:  Within functional limits for tasks assessed   SENSATION: WFL  POSTURE: flexed trunk  and weight shift left  LOWER EXTREMITY ROM:  ROM P:passive  A:active Right eval Left eval  Hip flexion    Hip extension    Hip abduction    Hip adduction    Hip internal rotation    Hip external rotation    Knee flexion WFL   Knee extension WFL   Ankle dorsiflexion    Ankle plantarflexion    Ankle inversion    Ankle eversion     (Blank rows = not tested)  LOWER EXTREMITY MMT:  MMT Right eval Left eval  Hip flexion 5/5 5/5  Hip extension 4/5   Hip abduction 4/5   Hip adduction    Hip internal rotation    Hip external rotation    Knee flexion 4/5   Knee extension 4/5   Ankle dorsiflexion    Ankle plantarflexion     Ankle inversion    Ankle eversion    (Blank rows = not tested)  TRANSFERS: 11/17/2022: sit to /from stand: independent without UE assist  10/22/2022 / Eval: Sit to stand: Modified independence Stand to sit: Modified independence  GAIT: 11/17/2022:  Pt amb community distances with cane including neg ramps & curbs safely.  Pt amb up to 200' (residual limb pain limits distances) without device safely with verbal cues only to correct deviations. Gait Velocity: self-selected 2.13 ft/sec & fast pace 2.67 ft/sec Functional Gait Assessment: 10/30  10/22/2022 / Eval: Gait pattern: step to pattern, decreased step length- Left, decreased stance time- Right, decreased hip/knee flexion- Right, Right hip hike, knee flexed in stance- Right, antalgic, trunk flexed, and abducted- Right Distance walked: 50' Assistive device utilized: Environmental consultant - 2 wheeled and TTA prosthesis Level of assistance: SBA / cues for deviations no balance issues noted with RW Comments: due to wound issues PT recommending RW for BUE support initially  FUNCTIONAL TESTs:  11/27/2022:   11/17/2022: Sharlene Motts Balance 50/56 & Functional Gait Assessment 10/30  Eval / 10/22/2022:  Sharlene Motts Balance Scale: 36/56  CURRENT PROSTHETIC WEAR ASSESSMENT: 11/17/2022: Pt wearing prosthesis most of awake hours except bathing & last 1-1.5 hours of day. Wound scab at distal tibia. PT continues to advise / problem-solve limb pain & other prosthetic issues.    Eval / 10/22/2022: Patient is dependent with: skin check, residual limb care, prosthetic cleaning, ply sock cleaning, correct ply sock adjustment, proper wear schedule/adjustment, and proper weight-bearing schedule/adjustment Donning prosthesis: SBA Doffing prosthesis: Modified independence Prosthetic wear tolerance: 1-1.5 hours, 2x/day, for last 2 days.  He wore prosthesis 1hr 2x on 3/8 and got water blisters.  Stopped wear on 3/9 & 3/10, resumed 3/11 1.5 hrs 2x/day for 2 days.  Prosthetic weight bearing  tolerance: 5 minutes Edema: pitting Residual limb condition: draining (serous) wound at distal tibia, 2nd open blister on distal limb, minimal hair growth, cylindrical shape, no warmth or abnormal color changes noted.  Prosthetic description: silicon liner with pin lock suspension, total contact socket with flexible inner liner, 0-ply socks, dynamic response foot K code/activity level with prosthetic use: Level 3    TODAY'S TREATMENT:  DATE:  12/16/2022: Prosthetic Training with TTA prosthesis: PT instructed in engaging hamstring muscles to pull tibia away from anterior socket and co-contracting with functional activities that require quad use.  -sitting at edge of chair with RLE ~30* flexed isometric hamstring set 5 sec 10 reps -hamstring set then standing up & hamstring set then sitting down 10 reps -stepping RLE forward with hamstring set with initial contact with heel 10 reps cane support -hamstring set weight shift onto prosthesis & stepping LLE forward 10 reps -gait 150' with cane with hamstring engagement with each heel strike initial contact -TUG engaging hamstring with sit to/from stand and each step heel strike initial contact without device  Near counter for support & safety Sidestepping with PT cues on technique to engage BLEs properly and weight shift. Engaging hamstring with prosthetic weight bearing. -progressed to turning 90* initially just turning foot to proper location then turning to walk along counter.  -progressed to turning 180* towards counter.  PT demo & verbal cues on technique for each. Pt return demo & verbalized understanding.    12/09/2022: Prosthetic Training with TTA prosthesis: PT reviewed pain management for limb pain. Use cane to limit weight bearing as needed working to build frequency & distance as tolerated.  Stand every 30 min even  using timer during day.  PT instructed with demo & verbal cues on use of cutoff socks to increase fit more distally than knee area.  Pt verbalized understanding and reports improved comfort.   Neuromuscular Re-education: PT instructed verbal, demo & HO cues on HEP. Pt verbalized & return demo understanding.  Standing in corner hip width apart on foam pad.  check to make sure pelvis is centered over feet so eqaul weight on feet.  Keep your eyes open first 5 reps of each motion, then eyes closed 5 reps.  Slowly, look up toward ceiling and then down toward the floor as if you are nodding yes. look side to side as if you are shaking head slowly no look up-right to down-left diagonal motion look up-left to down-right diagonal motion   11/27/2022: Prosthetic Training with TTA prosthesis: Pt amb 300' X 3 without device working on weight shift with pelvis & decreasing lateral trunk lean.  Carried 10# wt in contralateral UE to facilitate trunk stabilization.  Walking with pulley wt resistance 10# both concentric / eccentric control with wt posterior, anterior, right & left 5 reps ea with seated rests between.  Pt continues to progress amb without device distance & frequency based on pain.     HOME EXERCISE PROGRAM: Access Code: [email or web address] URL: [email or web address] Date: 12/09/2022 Prepared by: Vladimir Faster  Exercises - Wide stance on Foam Pad head movements  - 1 x daily - 4 x weekly - 1 sets - 5-10 reps  HEP 11/04/2022 with PT demo & verbal cues. Pt & wife verbalized understanding with Pt return demo.  1.  Sidestepping right and left along counter with cues on engaging bilateral lower extremities. 10' 3 laps.  2.  Counter RUE and cane LUE -walking forward with equal step length using heel toe as marker and walking backwards with weight shift over the prosthesis in stance. 10' 3 laps.   3.  Weight shift over RLE in stance with tightening hip muscles for stabilization tapping left  forefoot in lower cabinet. 10 reps 2 sets.  4. Sit to / from stand 18" chair without armrest using upper extremities lightly on seat.  PT demo and verbal cues on technique to decrease  upper extremity support and increased lower extremity utilization.  10 reps 2 sets.  5.  Picking up washcloth from floor. PT demo & verbal cues on technique with TTA prosthesis and set-up for safety.  10 reps 2 sets.   ASSESSMENT: CLINICAL IMPRESSION: Patient appears to understand engaging hamstring for functional activities. PT recommended focusing 2x/day for 10 min with activities that performed in PT. He reports that it did seem to improve limb pain issues.       OBJECTIVE IMPAIRMENTS: Abnormal gait, decreased activity tolerance, decreased balance, decreased knowledge of condition, decreased knowledge of use of DME, decreased mobility, difficulty walking, decreased strength, increased edema, prosthetic dependency , and wound on residual limb .   ACTIVITY LIMITATIONS: carrying, lifting, bending, standing, squatting, stairs, transfers, locomotion level, and prosthesis use  PARTICIPATION LIMITATIONS: meal prep, cleaning, driving, community activity, occupation, yard work, and recreational activities.  PERSONAL FACTORS: Fitness, Time since onset of injury/illness/exacerbation, and 3+ comorbidities: see PMH  are also affecting patient's functional outcome.   REHAB POTENTIAL: Good  CLINICAL DECISION MAKING: Evolving/moderate complexity  EVALUATION COMPLEXITY: Moderate   GOALS: Goals reviewed with patient? Yes  SHORT TERM GOALS: Target date: 11/20/2022  Patient donnes prosthesis modified independent & verbalizes proper cleaning. Baseline: SEE OBJECTIVE DATA Goal status: MET 11/17/2022 2.  Patient tolerates prosthesis >10 hrs total /day without skin issues increasing and no limb pain. Baseline: SEE OBJECTIVE DATA Goal status: MET 11/17/2022  3.  Berg >/= 45/56. Baseline: SEE OBJECTIVE DATA Goal status: MET  11/17/2022  4. Patient ambulates 100' with cane & prosthesis with supervision. Baseline: SEE OBJECTIVE DATA Goal status: MET 11/17/2022  5. Patient negotiates ramps & curbs with cane & prosthesis with minA. Baseline: SEE OBJECTIVE DATA Goal status: MET 11/17/2022  LONG TERM GOALS: Target date: 01/15/2023  Patient demonstrates & verbalized understanding of prosthetic care to enable safe utilization of prosthesis. Baseline: SEE OBJECTIVE DATA Goal status: Ongoing 10/27/2022  Patient tolerates prosthesis wear >90% of awake hours without skin or limb pain issues. Baseline: SEE OBJECTIVE DATA Goal status: Ongoing 10/27/2022  Functional Gait Assessment >/= 19/30 to indicate lower fall risk Baseline: SEE OBJECTIVE DATA Goal status: Ongoing 10/27/2022  Patient ambulates >500' with prosthesis only independently Baseline: SEE OBJECTIVE DATA Goal status: Ongoing 10/27/2022  Patient negotiates ramps, curbs & stairs with single rail with prosthesis only independently. Baseline: SEE OBJECTIVE DATA Goal status: Ongoing 10/27/2022  Patient demonstrates & verbalizes ability to perform yard work & golf with prosthesis only safely. Baseline: SEE OBJECTIVE DATA Goal status: Ongoing 10/27/2022   PLAN:  PT FREQUENCY: 1-2x/week  PT DURATION: 12 weeks  PLANNED INTERVENTIONS: Therapeutic exercises, Therapeutic activity, Neuromuscular re-education, Balance training, Gait training, Patient/Family education, Self Care, Stair training, Prosthetic training, Re-evaluation, and physical performance testing.  PLAN FOR NEXT SESSION: check if hamstring focus improved limb pain,  continue 1x/wk with instruction for activities to work on over the following week,  progress gait activities to include components of FGA,  continue check limb and review prosthetic care issues, therapeutic exercise & balance activities.   Vladimir Faster, PT, DPT 12/16/2022, 9:34 AM

## 2022-12-22 ENCOUNTER — Ambulatory Visit: Payer: 59 | Attending: Genetic Counselor | Admitting: Genetic Counselor

## 2022-12-22 ENCOUNTER — Ambulatory Visit (INDEPENDENT_AMBULATORY_CARE_PROVIDER_SITE_OTHER): Payer: 59 | Admitting: Orthopedic Surgery

## 2022-12-22 ENCOUNTER — Encounter: Payer: Self-pay | Admitting: Orthopedic Surgery

## 2022-12-22 DIAGNOSIS — Z89511 Acquired absence of right leg below knee: Secondary | ICD-10-CM | POA: Diagnosis not present

## 2022-12-22 DIAGNOSIS — S88111A Complete traumatic amputation at level between knee and ankle, right lower leg, initial encounter: Secondary | ICD-10-CM

## 2022-12-22 NOTE — Progress Notes (Signed)
Office Visit Note   Patient: Clinton [name]           Date of Birth: 22-Feb-1956           MRN: [id number] Visit Date: 12/22/2022              Requested by: Darrow Bussing, MD [address] [place],  [place] [address] PCP: Darrow Bussing, MD  Chief Complaint  Patient presents with   Right Leg - Follow-up    06/20/2022 revision right BKA       HPI: Patient is a 67 year old gentleman who is working with Zella Ball with gait training.  He states that the ulcers over the residual limb are improving.  He is currently wearing an under liner.  Assessment & Plan: Visit Diagnoses:  1. Below-knee amputation of right lower extremity, initial encounter [facility])     Plan: Patient will follow-up with [facility] he states that they are going to try a pneumatic sleeve on the exterior of the liner.  Follow-Up Instructions: Return in about 4 weeks (around 01/19/2023).   Ortho Exam  Patient is alert, oriented, no adenopathy, well-dressed, normal affect, normal respiratory effort. Examination the end bearing ulcer continues to heal this is less than 2 mm in diameter.  There is no cellulitis no drainage.  Imaging: No results found. No images are attached to the encounter.  Labs: Lab Results  Component Value Date   HGBA1C 5.4 04/15/2022   ESRSEDRATE 14 04/08/2022   CRP 2.1 04/08/2022   REPTSTATUS 06/25/2022 FINAL 06/20/2022   GRAMSTAIN  06/20/2022    ABUNDANT WBC PRESENT,BOTH PMN AND MONONUCLEAR NO ORGANISMS SEEN    CULT  06/20/2022    RARE HAEMOPHILUS PARAINFLUENZAE BETA LACTAMASE NEGATIVE FEW GEMELLA MORBILLORUM Standardized susceptibility testing for this organism is not available. Performed at [facility], [address], [place], [place] [address]      Lab Results  Component Value Date   ALBUMIN 3.6 06/20/2022   ALBUMIN 2.3 (L) 05/15/2022   ALBUMIN 3.9 08/27/2015    No results found for: "MG" No results found for: "VD25OH"  No results found for:  "PREALBUMIN"    Latest Ref Rng & Units 10/28/2022   10:05 AM 06/20/2022   11:45 AM 05/15/2022    5:14 AM  CBC EXTENDED  WBC 4.0 - 10.5 K/uL  6.3  5.6   RBC 4.22 - 5.81 MIL/uL  3.85  2.55   Hemoglobin 13.0 - 17.0 g/dL 82.9  56.2  8.1   HCT 13.0 - 52.0 % 38.0  36.3  24.2   Platelets 150 - 400 K/uL  231  218   NEUT# 1.7 - 7.7 K/uL  4.2  3.6   Lymph# 0.7 - 4.0 K/uL  1.5  1.3      There is no height or weight on file to calculate BMI.  Orders:  No orders of the defined types were placed in this encounter.  No orders of the defined types were placed in this encounter.    Procedures: No procedures performed  Clinical Data: No additional findings.  ROS:  All other systems negative, except as noted in the HPI. Review of Systems  Objective: Vital Signs: There were no vitals taken for this visit.  Specialty Comments:  No specialty comments available.  PMFS History: Patient Active Problem List   Diagnosis Date Noted   Elevated coronary artery calcium score 08/06/2022   Dehiscence of amputation stump of right lower extremity (HCC) 06/20/2022  Acute blood loss anemia 05/19/2022   Post-op pain 05/19/2022   Acute renal failure (HCC) 05/19/2022   S/P BKA (below knee amputation), right (HCC) 05/14/2022   Below-knee amputation of right lower extremity (HCC) 05/09/2022   Gangrene of right foot (HCC)    PAD (peripheral artery disease) (HCC) 04/17/2022   Acute osteomyelitis of metatarsal bone of right foot (HCC)    Atherosclerosis of native arteries of the extremities with ulceration (HCC) 04/15/2022   Peripheral arterial disease (HCC) 03/25/2022   Obesity 03/21/2022   Prediabetes 03/21/2022   Erectile dysfunction 07/26/2021   Family history of ischemic heart disease (IHD) 07/26/2021   Pure hypercholesterolemia 07/26/2021   History of adenomatous polyp of colon 02/03/2019   Internal hemorrhoids 02/03/2019   Carotid artery stenosis, asymptomatic 09/05/2015   Cellulitis     Hypertension    Hyperlipidemia    Occlusion and stenosis of carotid artery without mention of cerebral infarction 01/01/2012   Past Medical History:  Diagnosis Date   Carotid artery occlusion    Cellulitis    Erectile dysfunction    Hyperlipidemia    Hypertension    Peripheral vascular disease (HCC)     Family History  Problem Relation Age of Onset   Other Mother        circulatory problems   Heart attack Mother    Deep vein thrombosis Mother    Heart disease Mother    Hyperlipidemia Father    Hypertension Father    Diabetes Father    Deep vein thrombosis Father    Heart disease Father        before age 12   Heart attack Father    Peripheral vascular disease Father    Hypertension Brother    Hyperlipidemia Brother     Past Surgical History:  Procedure Laterality Date   ABDOMINAL AORTOGRAM W/LOWER EXTREMITY N/A 02/04/2022   Procedure: ABDOMINAL AORTOGRAM W/LOWER EXTREMITY;  Surgeon: Nada Libman, MD;  Location: [facility];  Service: Cardiovascular;  Laterality: N/A;   ABDOMINAL AORTOGRAM W/LOWER EXTREMITY N/A 02/18/2022   Procedure: ABDOMINAL AORTOGRAM W/LOWER EXTREMITY;  Surgeon: Nada Libman, MD;  Location: [facility];  Service: Cardiovascular;  Laterality: N/A;   ABDOMINAL AORTOGRAM W/LOWER EXTREMITY N/A 04/15/2022   Procedure: ABDOMINAL AORTOGRAM W/LOWER EXTREMITY;  Surgeon: Nada Libman, MD;  Location: [facility];  Service: Cardiovascular;  Laterality: N/A;   ABDOMINAL AORTOGRAM W/LOWER EXTREMITY N/A 10/28/2022   Procedure: ABDOMINAL AORTOGRAM W/LOWER EXTREMITY;  Surgeon: Nada Libman, MD;  Location: [facility];  Service: Cardiovascular;  Laterality: N/A;   AMPUTATION Right 04/16/2022   Procedure: RIGHT 5TH RAY AMPUTATION;  Surgeon: Nadara Mustard, MD;  Location: [facility] OR;  Service: Orthopedics;  Laterality: Right;   AMPUTATION Right 05/09/2022   Procedure: RIGHT BELOW KNEE AMPUTATION;  Surgeon: Nadara Mustard, MD;  Location: [facility] OR;   Service: Orthopedics;  Laterality: Right;   APPLICATION OF WOUND VAC Right 06/20/2022   Procedure: APPLICATION OF WOUND VAC;  Surgeon: Nadara Mustard, MD;  Location: [facility];  Service: Orthopedics;  Laterality: Right;   COLONOSCOPY     ENDARTERECTOMY Right 09/05/2015   Procedure: ENDARTERECTOMY CAROTID RIGHT;  Surgeon: Sherren Kerns, MD;  Location: [facility] OR;  Service: Vascular;  Laterality: Right;   PATCH ANGIOPLASTY  09/05/2015   Procedure: PATCH ANGIOPLASTY RIGHT CAROTID ARTERY USING HEMASHIELD PLATINUM FINESSE PATCH;  Surgeon: Sherren Kerns, MD;  Location: [facility];  Service: Vascular;;   PERIPHERAL INTRAVASCULAR  LITHOTRIPSY Left 10/28/2022   Procedure: INTRAVASCULAR LITHOTRIPSY;  Surgeon: Nada Libman, MD;  Location: [facility];  Service: Cardiovascular;  Laterality: Left;  L peroneal   PERIPHERAL VASCULAR ATHERECTOMY Left 02/18/2022   Procedure: PERIPHERAL VASCULAR ATHERECTOMY;  Surgeon: Nada Libman, MD;  Location: [facility];  Service: Cardiovascular;  Laterality: Left;  Popliteal and Peroneal   PERIPHERAL VASCULAR BALLOON ANGIOPLASTY  02/04/2022   Procedure: PERIPHERAL VASCULAR BALLOON ANGIOPLASTY;  Surgeon: Nada Libman, MD;  Location: [facility];  Service: Cardiovascular;;   PERIPHERAL VASCULAR BALLOON ANGIOPLASTY Right 04/15/2022   Procedure: PERIPHERAL VASCULAR BALLOON ANGIOPLASTY;  Surgeon: Nada Libman, MD;  Location: [facility];  Service: Cardiovascular;  Laterality: Right;  SFA/POPLITEAL   PERIPHERAL VASCULAR BALLOON ANGIOPLASTY Left 10/28/2022   Procedure: PERIPHERAL VASCULAR BALLOON ANGIOPLASTY;  Surgeon: Nada Libman, MD;  Location: [facility];  Service: Cardiovascular;  Laterality: Left;  L peroneal and L pop   STUMP REVISION Right 06/20/2022   Procedure: REVISION RIGHT BELOW KNEE AMPUTATION;  Surgeon: Nadara Mustard, MD;  Location: [facility] OR;  Service: Orthopedics;  Laterality: Right;   TONSILLECTOMY     VASECTOMY     Social  History   Occupational History   Not on file  Tobacco Use   Smoking status: Never   Smokeless tobacco: Former    Types: Chew    Quit date: 01/01/2015  Vaping Use   Vaping Use: Never used  Substance and Sexual Activity   Alcohol use: Not Currently   Drug use: Not on file   Sexual activity: Not on file

## 2022-12-23 ENCOUNTER — Encounter: Payer: Self-pay | Admitting: Physical Therapy

## 2022-12-23 ENCOUNTER — Ambulatory Visit (INDEPENDENT_AMBULATORY_CARE_PROVIDER_SITE_OTHER): Payer: 59 | Admitting: Physical Therapy

## 2022-12-23 DIAGNOSIS — L98498 Non-pressure chronic ulcer of skin of other sites with other specified severity: Secondary | ICD-10-CM

## 2022-12-23 DIAGNOSIS — R2681 Unsteadiness on feet: Secondary | ICD-10-CM | POA: Diagnosis not present

## 2022-12-23 DIAGNOSIS — M6281 Muscle weakness (generalized): Secondary | ICD-10-CM | POA: Diagnosis not present

## 2022-12-23 DIAGNOSIS — R2689 Other abnormalities of gait and mobility: Secondary | ICD-10-CM

## 2022-12-23 NOTE — Therapy (Signed)
OUTPATIENT PHYSICAL THERAPY TREATMENT   Patient Name: Clinton [name] MRN: [id number] DOB:08-15-1955, 67 y.o., male Today's Date: 12/23/2022  PCP: Darrow Bussing, MD REFERRING PROVIDER: Delle Reining, PA-C    END OF SESSION:  PT End of Session - 12/23/22 0800     Visit Number 13    Number of Visits 20    Date for PT Re-Evaluation 01/15/23    Authorization Type [employer]    Authorization Time Period 20 PT visits, 10% co-insurance    Authorization - Number of Visits 20    Progress Note Due on Visit 20    PT Start Time 0800    PT Stop Time 0845    PT Time Calculation (min) 45 min    Activity Tolerance Patient tolerated treatment well    Behavior During Therapy WFL for tasks assessed/performed                    Past Medical History:  Diagnosis Date   Carotid artery occlusion    Cellulitis    Erectile dysfunction    Hyperlipidemia    Hypertension    Peripheral vascular disease (HCC)    Past Surgical History:  Procedure Laterality Date   ABDOMINAL AORTOGRAM W/LOWER EXTREMITY N/A 02/04/2022   Procedure: ABDOMINAL AORTOGRAM W/LOWER EXTREMITY;  Surgeon: Nada Libman, MD;  Location: [facility];  Service: Cardiovascular;  Laterality: N/A;   ABDOMINAL AORTOGRAM W/LOWER EXTREMITY N/A 02/18/2022   Procedure: ABDOMINAL AORTOGRAM W/LOWER EXTREMITY;  Surgeon: Nada Libman, MD;  Location: [facility];  Service: Cardiovascular;  Laterality: N/A;   ABDOMINAL AORTOGRAM W/LOWER EXTREMITY N/A 04/15/2022   Procedure: ABDOMINAL AORTOGRAM W/LOWER EXTREMITY;  Surgeon: Nada Libman, MD;  Location: [facility];  Service: Cardiovascular;  Laterality: N/A;   ABDOMINAL AORTOGRAM W/LOWER EXTREMITY N/A 10/28/2022   Procedure: ABDOMINAL AORTOGRAM W/LOWER EXTREMITY;  Surgeon: Nada Libman, MD;  Location: [facility];  Service: Cardiovascular;  Laterality: N/A;   AMPUTATION Right 04/16/2022   Procedure: RIGHT 5TH RAY AMPUTATION;  Surgeon: Nadara Mustard, MD;   Location: [facility] OR;  Service: Orthopedics;  Laterality: Right;   AMPUTATION Right 05/09/2022   Procedure: RIGHT BELOW KNEE AMPUTATION;  Surgeon: Nadara Mustard, MD;  Location: [facility] OR;  Service: Orthopedics;  Laterality: Right;   APPLICATION OF WOUND VAC Right 06/20/2022   Procedure: APPLICATION OF WOUND VAC;  Surgeon: Nadara Mustard, MD;  Location: [facility];  Service: Orthopedics;  Laterality: Right;   COLONOSCOPY     ENDARTERECTOMY Right 09/05/2015   Procedure: ENDARTERECTOMY CAROTID RIGHT;  Surgeon: Sherren Kerns, MD;  Location: [facility] OR;  Service: Vascular;  Laterality: Right;   PATCH ANGIOPLASTY  09/05/2015   Procedure: PATCH ANGIOPLASTY RIGHT CAROTID ARTERY USING HEMASHIELD PLATINUM FINESSE PATCH;  Surgeon: Sherren Kerns, MD;  Location: [facility] OR;  Service: Vascular;;   PERIPHERAL INTRAVASCULAR LITHOTRIPSY Left 10/28/2022   Procedure: INTRAVASCULAR LITHOTRIPSY;  Surgeon: Nada Libman, MD;  Location: [facility];  Service: Cardiovascular;  Laterality: Left;  L peroneal   PERIPHERAL VASCULAR ATHERECTOMY Left 02/18/2022   Procedure: PERIPHERAL VASCULAR ATHERECTOMY;  Surgeon: Nada Libman, MD;  Location: [facility];  Service: Cardiovascular;  Laterality: Left;  Popliteal and Peroneal   PERIPHERAL VASCULAR BALLOON ANGIOPLASTY  02/04/2022   Procedure: PERIPHERAL VASCULAR BALLOON ANGIOPLASTY;  Surgeon: Nada Libman, MD;  Location: [facility];  Service: Cardiovascular;;   PERIPHERAL VASCULAR BALLOON ANGIOPLASTY Right 04/15/2022  Procedure: PERIPHERAL VASCULAR BALLOON ANGIOPLASTY;  Surgeon: Nada Libman, MD;  Location: [facility];  Service: Cardiovascular;  Laterality: Right;  SFA/POPLITEAL   PERIPHERAL VASCULAR BALLOON ANGIOPLASTY Left 10/28/2022   Procedure: PERIPHERAL VASCULAR BALLOON ANGIOPLASTY;  Surgeon: Nada Libman, MD;  Location: [facility];  Service: Cardiovascular;  Laterality: Left;  L peroneal and L pop   STUMP REVISION Right 06/20/2022    Procedure: REVISION RIGHT BELOW KNEE AMPUTATION;  Surgeon: Nadara Mustard, MD;  Location: [facility] OR;  Service: Orthopedics;  Laterality: Right;   TONSILLECTOMY     VASECTOMY     Patient Active Problem List   Diagnosis Date Noted   Elevated coronary artery calcium score 08/06/2022   Dehiscence of amputation stump of right lower extremity (HCC) 06/20/2022   Acute blood loss anemia 05/19/2022   Post-op pain 05/19/2022   Acute renal failure (HCC) 05/19/2022   S/P BKA (below knee amputation), right (HCC) 05/14/2022   Below-knee amputation of right lower extremity (HCC) 05/09/2022   Gangrene of right foot (HCC)    PAD (peripheral artery disease) (HCC) 04/17/2022   Acute osteomyelitis of metatarsal bone of right foot (HCC)    Atherosclerosis of native arteries of the extremities with ulceration (HCC) 04/15/2022   Peripheral arterial disease (HCC) 03/25/2022   Obesity 03/21/2022   Prediabetes 03/21/2022   Erectile dysfunction 07/26/2021   Family history of ischemic heart disease (IHD) 07/26/2021   Pure hypercholesterolemia 07/26/2021   History of adenomatous polyp of colon 02/03/2019   Internal hemorrhoids 02/03/2019   Carotid artery stenosis, asymptomatic 09/05/2015   Cellulitis    Hypertension    Hyperlipidemia    Occlusion and stenosis of carotid artery without mention of cerebral infarction 01/01/2012    ONSET DATE: 10/15/2022 prosthesis delivery  REFERRING DIAG: N56.213Y (ICD-10-CM) - Below-knee amputation of right lower extremity   THERAPY DIAG:  Other abnormalities of gait and mobility  Unsteadiness on feet  Non-pressure chronic ulcer of skin of other sites with other specified severity (HCC)  Muscle weakness (generalized)  Rationale for Evaluation and Treatment: Rehabilitation  SUBJECTIVE:   SUBJECTIVE STATEMENT:  He has been aware of activity level to manage pain.  He got his foam to start working on corner balance.  Pain seems to be better. Air bladder is getting on noon  today. He has done hamstring sets sitting only.   PERTINENT HISTORY: carotid artery occlusion, HTN, PVD, BLE vascular sugeries  PAIN:  Are you having pain? Limb pain with weight bearing in last week 1-2/10 - 6-7/10 decreases by adjusting socks & using cane to limit WB decreases pain.   PRECAUTIONS: None  WEIGHT BEARING RESTRICTIONS: No  FALLS: Has patient fallen in last 6 months? No  LIVING ENVIRONMENT: Lives with: lives with their spouse Lives in: House  single level Home Access: Stairs to enter Home layout: One level Stairs: Yes: External: 3 steps; can reach both Has following equipment at home: Single point cane, Walker - 2 wheeled, Wheelchair (manual), shower chair, and knee scooter  PLOF: Independent, Independent with household mobility without device, and Independent with community mobility without device  circulation issues started in 2019 with worsening 2021,   PATIENT GOALS:   to use prosthesis to travel, golf, climb ladder, go to gym, active in community including coaching,    OBJECTIVE:  COGNITION: Overall cognitive status: Within functional limits for tasks assessed   SENSATION: WFL  POSTURE: flexed trunk  and weight shift left  LOWER EXTREMITY ROM:  ROM  P:passive  A:active Right eval Left eval  Hip flexion    Hip extension    Hip abduction    Hip adduction    Hip internal rotation    Hip external rotation    Knee flexion WFL   Knee extension WFL   Ankle dorsiflexion    Ankle plantarflexion    Ankle inversion    Ankle eversion     (Blank rows = not tested)  LOWER EXTREMITY MMT:  MMT Right eval Left eval  Hip flexion 5/5 5/5  Hip extension 4/5   Hip abduction 4/5   Hip adduction    Hip internal rotation    Hip external rotation    Knee flexion 4/5   Knee extension 4/5   Ankle dorsiflexion    Ankle plantarflexion    Ankle inversion    Ankle eversion    (Blank rows = not tested)  TRANSFERS: 11/17/2022: sit to /from stand: independent  without UE assist  10/22/2022 / Eval: Sit to stand: Modified independence Stand to sit: Modified independence  GAIT: 11/17/2022:  Pt amb community distances with cane including neg ramps & curbs safely.  Pt amb up to 200' (residual limb pain limits distances) without device safely with verbal cues only to correct deviations. Gait Velocity: self-selected 2.13 ft/sec & fast pace 2.67 ft/sec Functional Gait Assessment: 10/30  10/22/2022 / Eval: Gait pattern: step to pattern, decreased step length- Left, decreased stance time- Right, decreased hip/knee flexion- Right, Right hip hike, knee flexed in stance- Right, antalgic, trunk flexed, and abducted- Right Distance walked: 50' Assistive device utilized: Environmental consultant - 2 wheeled and TTA prosthesis Level of assistance: SBA / cues for deviations no balance issues noted with RW Comments: due to wound issues PT recommending RW for BUE support initially  FUNCTIONAL TESTs:  11/27/2022:   11/17/2022: Sharlene Motts Balance 50/56 & Functional Gait Assessment 10/30  Eval / 10/22/2022:  Sharlene Motts Balance Scale: 36/56  CURRENT PROSTHETIC WEAR ASSESSMENT: 11/17/2022: Pt wearing prosthesis most of awake hours except bathing & last 1-1.5 hours of day. Wound scab at distal tibia. PT continues to advise / problem-solve limb pain & other prosthetic issues.    Eval / 10/22/2022: Patient is dependent with: skin check, residual limb care, prosthetic cleaning, ply sock cleaning, correct ply sock adjustment, proper wear schedule/adjustment, and proper weight-bearing schedule/adjustment Donning prosthesis: SBA Doffing prosthesis: Modified independence Prosthetic wear tolerance: 1-1.5 hours, 2x/day, for last 2 days.  He wore prosthesis 1hr 2x on 3/8 and got water blisters.  Stopped wear on 3/9 & 3/10, resumed 3/11 1.5 hrs 2x/day for 2 days.  Prosthetic weight bearing tolerance: 5 minutes Edema: pitting Residual limb condition: draining (serous) wound at distal tibia, 2nd open blister on  distal limb, minimal hair growth, cylindrical shape, no warmth or abnormal color changes noted.  Prosthetic description: silicon liner with pin lock suspension, total contact socket with flexible inner liner, 0-ply socks, dynamic response foot K code/activity level with prosthetic use: Level 3    TODAY'S TREATMENT:  DATE:  12/23/2022: Prosthetic Training with TTA prosthesis: Stationary stance with equal weight bearing BLEs. Can touch item on right side to facilitate weight on RLE. Pt return demo understanding.  PT instructed patient in concerns for volume changes between the end of the day and the next morning.  He reports using 5 wear shrinker against the residual limb and only 1 ply of tubal compression sock over the top.  PT recommended using double table compression sock with a goal to have less difference in morning ply sock fit compared to end of day.  Patient verbalized understanding Patient ambulated 300 feet x 2 without an assist device working on scanning and maintaining path. PT demo and educated on stepping over obstacle.  Patient able to return demonstration without assistive device with supervision. Patient negotiated ramp and curb without an assistive device with supervision.  PT cueing to improve technique.  Neuromuscular reeducation: Standing in corner with equal weightbearing bilateral lower extremities feet hip with apart initiating each activity/head movement with equal weightbearing.  Head motions right/left, up/down and diagonals 5 reps each eyes open on floor, eyes closed on floor and eyes open on foam.  Intermittent touch for balance.    12/16/2022: Prosthetic Training with TTA prosthesis: PT instructed in engaging hamstring muscles to pull tibia away from anterior socket and co-contracting with functional activities that require quad use.  -sitting at  edge of chair with RLE ~30* flexed isometric hamstring set 5 sec 10 reps -hamstring set then standing up & hamstring set then sitting down 10 reps -stepping RLE forward with hamstring set with initial contact with heel 10 reps cane support -hamstring set weight shift onto prosthesis & stepping LLE forward 10 reps -gait 150' with cane with hamstring engagement with each heel strike initial contact -TUG engaging hamstring with sit to/from stand and each step heel strike initial contact without device  Near counter for support & safety Sidestepping with PT cues on technique to engage BLEs properly and weight shift. Engaging hamstring with prosthetic weight bearing. -progressed to turning 90* initially just turning foot to proper location then turning to walk along counter.  -progressed to turning 180* towards counter.  PT demo & verbal cues on technique for each. Pt return demo & verbalized understanding.    12/09/2022: Prosthetic Training with TTA prosthesis: PT reviewed pain management for limb pain. Use cane to limit weight bearing as needed working to build frequency & distance as tolerated.  Stand every 30 min even using timer during day.  PT instructed with demo & verbal cues on use of cutoff socks to increase fit more distally than knee area.  Pt verbalized understanding and reports improved comfort.   Neuromuscular Re-education: PT instructed verbal, demo & HO cues on HEP. Pt verbalized & return demo understanding.  Standing in corner hip width apart on foam pad.  check to make sure pelvis is centered over feet so eqaul weight on feet.  Keep your eyes open first 5 reps of each motion, then eyes closed 5 reps.  Slowly, look up toward ceiling and then down toward the floor as if you are nodding yes. look side to side as if you are shaking head slowly no look up-right to down-left diagonal motion look up-left to down-right diagonal motion    HOME EXERCISE PROGRAM: Access Code:  [email or web address] URL: [email or web address] Date: 12/09/2022 Prepared by: Vladimir Faster  Exercises - Wide stance on Foam Pad head movements  - 1 x daily - 4 x weekly - 1 sets -  5-10 reps  HEP 11/04/2022 with PT demo & verbal cues. Pt & wife verbalized understanding with Pt return demo.  1.  Sidestepping right and left along counter with cues on engaging bilateral lower extremities. 10' 3 laps.  2.  Counter RUE and cane LUE -walking forward with equal step length using heel toe as marker and walking backwards with weight shift over the prosthesis in stance. 10' 3 laps.   3.  Weight shift over RLE in stance with tightening hip muscles for stabilization tapping left forefoot in lower cabinet. 10 reps 2 sets.  4. Sit to / from stand 18" chair without armrest using upper extremities lightly on seat.  PT demo and verbal cues on technique to decrease upper extremity support and increased lower extremity utilization.  10 reps 2 sets.  5.  Picking up washcloth from floor. PT demo & verbal cues on technique with TTA prosthesis and set-up for safety.  10 reps 2 sets.   ASSESSMENT: CLINICAL IMPRESSION: Patient's residual limb pain appears to be improving limiting activities less.  Patient appears to understand HEP in corner for balance.  Patient continues to benefit from skilled PT to progress prosthetic use and safety.     OBJECTIVE IMPAIRMENTS: Abnormal gait, decreased activity tolerance, decreased balance, decreased knowledge of condition, decreased knowledge of use of DME, decreased mobility, difficulty walking, decreased strength, increased edema, prosthetic dependency , and wound on residual limb .   ACTIVITY LIMITATIONS: carrying, lifting, bending, standing, squatting, stairs, transfers, locomotion level, and prosthesis use  PARTICIPATION LIMITATIONS: meal prep, cleaning, driving, community activity, occupation, yard work, and recreational activities.  PERSONAL FACTORS: Fitness, Time since  onset of injury/illness/exacerbation, and 3+ comorbidities: see PMH  are also affecting patient's functional outcome.   REHAB POTENTIAL: Good  CLINICAL DECISION MAKING: Evolving/moderate complexity  EVALUATION COMPLEXITY: Moderate   GOALS: Goals reviewed with patient? Yes  SHORT TERM GOALS: Target date: 11/20/2022  Patient donnes prosthesis modified independent & verbalizes proper cleaning. Baseline: SEE OBJECTIVE DATA Goal status: MET 11/17/2022 2.  Patient tolerates prosthesis >10 hrs total /day without skin issues increasing and no limb pain. Baseline: SEE OBJECTIVE DATA Goal status: MET 11/17/2022  3.  Berg >/= 45/56. Baseline: SEE OBJECTIVE DATA Goal status: MET 11/17/2022  4. Patient ambulates 100' with cane & prosthesis with supervision. Baseline: SEE OBJECTIVE DATA Goal status: MET 11/17/2022  5. Patient negotiates ramps & curbs with cane & prosthesis with minA. Baseline: SEE OBJECTIVE DATA Goal status: MET 11/17/2022  LONG TERM GOALS: Target date: 01/15/2023  Patient demonstrates & verbalized understanding of prosthetic care to enable safe utilization of prosthesis. Baseline: SEE OBJECTIVE DATA Goal status: Ongoing 10/27/2022  Patient tolerates prosthesis wear >90% of awake hours without skin or limb pain issues. Baseline: SEE OBJECTIVE DATA Goal status: Ongoing 10/27/2022  Functional Gait Assessment >/= 19/30 to indicate lower fall risk Baseline: SEE OBJECTIVE DATA Goal status: Ongoing 10/27/2022  Patient ambulates >500' with prosthesis only independently Baseline: SEE OBJECTIVE DATA Goal status: Ongoing 10/27/2022  Patient negotiates ramps, curbs & stairs with single rail with prosthesis only independently. Baseline: SEE OBJECTIVE DATA Goal status: Ongoing 10/27/2022  Patient demonstrates & verbalizes ability to perform yard work & golf with prosthesis only safely. Baseline: SEE OBJECTIVE DATA Goal status: Ongoing 10/27/2022   PLAN:  PT FREQUENCY: 1-2x/week  PT  DURATION: 12 weeks  PLANNED INTERVENTIONS: Therapeutic exercises, Therapeutic activity, Neuromuscular re-education, Balance training, Gait training, Patient/Family education, Self Care, Stair training, Prosthetic training, Re-evaluation, and physical performance testing.  PLAN  FOR NEXT SESSION: Check new bladder system for prosthesis.  Continue 1x/wk with instruction for activities to work on over the following week,  progress gait activities to include components of FGA,  continue check limb and review prosthetic care issues, therapeutic exercise & balance activities.   Vladimir Faster, PT, DPT 12/23/2022, 12:33 PM

## 2022-12-29 ENCOUNTER — Encounter: Payer: Self-pay | Admitting: Cardiovascular Disease

## 2022-12-30 ENCOUNTER — Ambulatory Visit (INDEPENDENT_AMBULATORY_CARE_PROVIDER_SITE_OTHER): Payer: 59 | Admitting: Physical Therapy

## 2022-12-30 ENCOUNTER — Encounter: Payer: Self-pay | Admitting: Physical Therapy

## 2022-12-30 DIAGNOSIS — M6281 Muscle weakness (generalized): Secondary | ICD-10-CM

## 2022-12-30 DIAGNOSIS — R2681 Unsteadiness on feet: Secondary | ICD-10-CM

## 2022-12-30 DIAGNOSIS — R2689 Other abnormalities of gait and mobility: Secondary | ICD-10-CM

## 2022-12-30 DIAGNOSIS — L98498 Non-pressure chronic ulcer of skin of other sites with other specified severity: Secondary | ICD-10-CM

## 2022-12-30 NOTE — Therapy (Signed)
OUTPATIENT PHYSICAL THERAPY TREATMENT   Patient Name: Clinton [name] MRN: [id number] DOB:11/16/1955, 67 y.o., male Today's Date: 12/30/2022  PCP: Darrow Bussing, MD REFERRING PROVIDER: Delle Reining, PA-C    END OF SESSION:  PT End of Session - 12/30/22 0758     Visit Number 14    Number of Visits 20    Date for PT Re-Evaluation 01/15/23    Authorization Type [employer]    Authorization Time Period 20 PT visits, 10% co-insurance    Authorization - Visit Number 14    Authorization - Number of Visits 20    Progress Note Due on Visit 20    PT Start Time 0759    PT Stop Time 0845    PT Time Calculation (min) 46 min    Activity Tolerance Patient tolerated treatment well    Behavior During Therapy WFL for tasks assessed/performed                     Past Medical History:  Diagnosis Date   Carotid artery occlusion    Cellulitis    Erectile dysfunction    Hyperlipidemia    Hypertension    Peripheral vascular disease (HCC)    Past Surgical History:  Procedure Laterality Date   ABDOMINAL AORTOGRAM W/LOWER EXTREMITY N/A 02/04/2022   Procedure: ABDOMINAL AORTOGRAM W/LOWER EXTREMITY;  Surgeon: Nada Libman, MD;  Location: [facility];  Service: Cardiovascular;  Laterality: N/A;   ABDOMINAL AORTOGRAM W/LOWER EXTREMITY N/A 02/18/2022   Procedure: ABDOMINAL AORTOGRAM W/LOWER EXTREMITY;  Surgeon: Nada Libman, MD;  Location: [facility];  Service: Cardiovascular;  Laterality: N/A;   ABDOMINAL AORTOGRAM W/LOWER EXTREMITY N/A 04/15/2022   Procedure: ABDOMINAL AORTOGRAM W/LOWER EXTREMITY;  Surgeon: Nada Libman, MD;  Location: [facility];  Service: Cardiovascular;  Laterality: N/A;   ABDOMINAL AORTOGRAM W/LOWER EXTREMITY N/A 10/28/2022   Procedure: ABDOMINAL AORTOGRAM W/LOWER EXTREMITY;  Surgeon: Nada Libman, MD;  Location: [facility];  Service: Cardiovascular;  Laterality: N/A;   AMPUTATION Right 04/16/2022   Procedure: RIGHT 5TH RAY  AMPUTATION;  Surgeon: Nadara Mustard, MD;  Location: [facility] OR;  Service: Orthopedics;  Laterality: Right;   AMPUTATION Right 05/09/2022   Procedure: RIGHT BELOW KNEE AMPUTATION;  Surgeon: Nadara Mustard, MD;  Location: [facility] OR;  Service: Orthopedics;  Laterality: Right;   APPLICATION OF WOUND VAC Right 06/20/2022   Procedure: APPLICATION OF WOUND VAC;  Surgeon: Nadara Mustard, MD;  Location: [facility];  Service: Orthopedics;  Laterality: Right;   COLONOSCOPY     ENDARTERECTOMY Right 09/05/2015   Procedure: ENDARTERECTOMY CAROTID RIGHT;  Surgeon: Sherren Kerns, MD;  Location: [facility] OR;  Service: Vascular;  Laterality: Right;   PATCH ANGIOPLASTY  09/05/2015   Procedure: PATCH ANGIOPLASTY RIGHT CAROTID ARTERY USING HEMASHIELD PLATINUM FINESSE PATCH;  Surgeon: Sherren Kerns, MD;  Location: [facility] OR;  Service: Vascular;;   PERIPHERAL INTRAVASCULAR LITHOTRIPSY Left 10/28/2022   Procedure: INTRAVASCULAR LITHOTRIPSY;  Surgeon: Nada Libman, MD;  Location: [facility];  Service: Cardiovascular;  Laterality: Left;  L peroneal   PERIPHERAL VASCULAR ATHERECTOMY Left 02/18/2022   Procedure: PERIPHERAL VASCULAR ATHERECTOMY;  Surgeon: Nada Libman, MD;  Location: [facility];  Service: Cardiovascular;  Laterality: Left;  Popliteal and Peroneal   PERIPHERAL VASCULAR BALLOON ANGIOPLASTY  02/04/2022   Procedure: PERIPHERAL VASCULAR BALLOON ANGIOPLASTY;  Surgeon: Nada Libman, MD;  Location: [facility];  Service: Cardiovascular;;  PERIPHERAL VASCULAR BALLOON ANGIOPLASTY Right 04/15/2022   Procedure: PERIPHERAL VASCULAR BALLOON ANGIOPLASTY;  Surgeon: Nada Libman, MD;  Location: [facility];  Service: Cardiovascular;  Laterality: Right;  SFA/POPLITEAL   PERIPHERAL VASCULAR BALLOON ANGIOPLASTY Left 10/28/2022   Procedure: PERIPHERAL VASCULAR BALLOON ANGIOPLASTY;  Surgeon: Nada Libman, MD;  Location: [facility];  Service: Cardiovascular;  Laterality: Left;  L peroneal and L pop    STUMP REVISION Right 06/20/2022   Procedure: REVISION RIGHT BELOW KNEE AMPUTATION;  Surgeon: Nadara Mustard, MD;  Location: [facility] OR;  Service: Orthopedics;  Laterality: Right;   TONSILLECTOMY     VASECTOMY     Patient Active Problem List   Diagnosis Date Noted   Elevated coronary artery calcium score 08/06/2022   Dehiscence of amputation stump of right lower extremity (HCC) 06/20/2022   Acute blood loss anemia 05/19/2022   Post-op pain 05/19/2022   Acute renal failure (HCC) 05/19/2022   S/P BKA (below knee amputation), right (HCC) 05/14/2022   Below-knee amputation of right lower extremity (HCC) 05/09/2022   Gangrene of right foot (HCC)    PAD (peripheral artery disease) (HCC) 04/17/2022   Acute osteomyelitis of metatarsal bone of right foot (HCC)    Atherosclerosis of native arteries of the extremities with ulceration (HCC) 04/15/2022   Peripheral arterial disease (HCC) 03/25/2022   Obesity 03/21/2022   Prediabetes 03/21/2022   Erectile dysfunction 07/26/2021   Family history of ischemic heart disease (IHD) 07/26/2021   Pure hypercholesterolemia 07/26/2021   History of adenomatous polyp of colon 02/03/2019   Internal hemorrhoids 02/03/2019   Carotid artery stenosis, asymptomatic 09/05/2015   Cellulitis    Hypertension    Hyperlipidemia    Occlusion and stenosis of carotid artery without mention of cerebral infarction 01/01/2012    ONSET DATE: 10/15/2022 prosthesis delivery  REFERRING DIAG: Z61.096E (ICD-10-CM) - Below-knee amputation of right lower extremity   THERAPY DIAG:  Other abnormalities of gait and mobility  Unsteadiness on feet  Muscle weakness (generalized)  Non-pressure chronic ulcer of skin of other sites with other specified severity (HCC)  Rationale for Evaluation and Treatment: Rehabilitation  SUBJECTIVE:   SUBJECTIVE STATEMENT: He got bladder sleeve a week ago and it seems to be helping.  He is still working on where to locate it & how much to  inflate. His wounds appear to be healing.    PERTINENT HISTORY: carotid artery occlusion, HTN, PVD, BLE vascular sugeries  PAIN:  Are you having pain? Limb pain with weight bearing in last week 1/10 - 7/10 decreases by adjusting socks & using cane to limit WB decreases pain.   PRECAUTIONS: None  WEIGHT BEARING RESTRICTIONS: No  FALLS: Has patient fallen in last 6 months? No  LIVING ENVIRONMENT: Lives with: lives with their spouse Lives in: House  single level Home Access: Stairs to enter Home layout: One level Stairs: Yes: External: 3 steps; can reach both Has following equipment at home: Single point cane, Walker - 2 wheeled, Wheelchair (manual), shower chair, and knee scooter  PLOF: Independent, Independent with household mobility without device, and Independent with community mobility without device  circulation issues started in 2019 with worsening 2021,   PATIENT GOALS:   to use prosthesis to travel, golf, climb ladder, go to gym, active in community including coaching,    OBJECTIVE:  COGNITION: Overall cognitive status: Within functional limits for tasks assessed   SENSATION: WFL  POSTURE: flexed trunk  and weight shift left  LOWER EXTREMITY ROM:  ROM P:passive  A:active Right eval Left eval  Hip flexion    Hip extension    Hip abduction    Hip adduction    Hip internal rotation    Hip external rotation    Knee flexion WFL   Knee extension WFL   Ankle dorsiflexion    Ankle plantarflexion    Ankle inversion    Ankle eversion     (Blank rows = not tested)  LOWER EXTREMITY MMT:  MMT Right eval Left eval  Hip flexion 5/5 5/5  Hip extension 4/5   Hip abduction 4/5   Hip adduction    Hip internal rotation    Hip external rotation    Knee flexion 4/5   Knee extension 4/5   Ankle dorsiflexion    Ankle plantarflexion    Ankle inversion    Ankle eversion    (Blank rows = not tested)  TRANSFERS: 11/17/2022: sit to /from stand: independent without UE  assist  10/22/2022 / Eval: Sit to stand: Modified independence Stand to sit: Modified independence  GAIT: 11/17/2022:  Pt amb community distances with cane including neg ramps & curbs safely.  Pt amb up to 200' (residual limb pain limits distances) without device safely with verbal cues only to correct deviations. Gait Velocity: self-selected 2.13 ft/sec & fast pace 2.67 ft/sec Functional Gait Assessment: 10/30  10/22/2022 / Eval: Gait pattern: step to pattern, decreased step length- Left, decreased stance time- Right, decreased hip/knee flexion- Right, Right hip hike, knee flexed in stance- Right, antalgic, trunk flexed, and abducted- Right Distance walked: 50' Assistive device utilized: Environmental consultant - 2 wheeled and TTA prosthesis Level of assistance: SBA / cues for deviations no balance issues noted with RW Comments: due to wound issues PT recommending RW for BUE support initially  FUNCTIONAL TESTs:  11/27/2022:   11/17/2022: Sharlene Motts Balance 50/56 & Functional Gait Assessment 10/30  Eval / 10/22/2022:  Sharlene Motts Balance Scale: 36/56  CURRENT PROSTHETIC WEAR ASSESSMENT: 11/17/2022: Pt wearing prosthesis most of awake hours except bathing & last 1-1.5 hours of day. Wound scab at distal tibia. PT continues to advise / problem-solve limb pain & other prosthetic issues.    Eval / 10/22/2022: Patient is dependent with: skin check, residual limb care, prosthetic cleaning, ply sock cleaning, correct ply sock adjustment, proper wear schedule/adjustment, and proper weight-bearing schedule/adjustment Donning prosthesis: SBA Doffing prosthesis: Modified independence Prosthetic wear tolerance: 1-1.5 hours, 2x/day, for last 2 days.  He wore prosthesis 1hr 2x on 3/8 and got water blisters.  Stopped wear on 3/9 & 3/10, resumed 3/11 1.5 hrs 2x/day for 2 days.  Prosthetic weight bearing tolerance: 5 minutes Edema: pitting Residual limb condition: draining (serous) wound at distal tibia, 2nd open blister on distal  limb, minimal hair growth, cylindrical shape, no warmth or abnormal color changes noted.  Prosthetic description: silicon liner with pin lock suspension, total contact socket with flexible inner liner, 0-ply socks, dynamic response foot K code/activity level with prosthetic use: Level 3    TODAY'S TREATMENT:  DATE:  12/30/2022: Prosthetic Training with TTA prosthesis: PT demo & verbal cues on locating bladder sleeve so pressure will not be on tibial plateau or crest. Pt verbalized understanding.  Used mirror to work on equal weight bearing BLEs with pelvic / trunk shift with hip width stance for standing ADLs and wider for sports. Focus on weight shift onto prosthesis before initiating swing.  Simulated baseball & golf swing with proper weight shift. Tossing ball with LLE step then RLE step through.  Pt verbalized understanding. PT demo & verbal cues on focus for sound foot heel rise prior to stepping to facilitate weight shift directly over prosthesis in stance. Pt able to return demo understanding and reports improved comfort with prosthesis.    Therapeutic Exercise: Leg Press BLEs 100# 10 reps with PT cueing use at gym. Hamstring stretch seated with strap DF trunk flexion 30 sec hold 1 rep Standing gastroc with forefoot on 2" step 30 sec hold 1 rep Quad stretch supine hooklying with strap knee flexion 30 sec hold 1 rep Pt verbalized understanding of above.    12/23/2022: Prosthetic Training with TTA prosthesis: Stationary stance with equal weight bearing BLEs. Can touch item on right side to facilitate weight on RLE. Pt return demo understanding.  PT instructed patient in concerns for volume changes between the end of the day and the next morning.  He reports using 5 wear shrinker against the residual limb and only 1 ply of tubal compression sock over the top.  PT  recommended using double table compression sock with a goal to have less difference in morning ply sock fit compared to end of day.  Patient verbalized understanding Patient ambulated 300 feet x 2 without an assist device working on scanning and maintaining path. PT demo and educated on stepping over obstacle.  Patient able to return demonstration without assistive device with supervision. Patient negotiated ramp and curb without an assistive device with supervision.  PT cueing to improve technique.  Neuromuscular reeducation: Standing in corner with equal weightbearing bilateral lower extremities feet hip with apart initiating each activity/head movement with equal weightbearing.  Head motions right/left, up/down and diagonals 5 reps each eyes open on floor, eyes closed on floor and eyes open on foam.  Intermittent touch for balance.    12/16/2022: Prosthetic Training with TTA prosthesis: PT instructed in engaging hamstring muscles to pull tibia away from anterior socket and co-contracting with functional activities that require quad use.  -sitting at edge of chair with RLE ~30* flexed isometric hamstring set 5 sec 10 reps -hamstring set then standing up & hamstring set then sitting down 10 reps -stepping RLE forward with hamstring set with initial contact with heel 10 reps cane support -hamstring set weight shift onto prosthesis & stepping LLE forward 10 reps -gait 150' with cane with hamstring engagement with each heel strike initial contact -TUG engaging hamstring with sit to/from stand and each step heel strike initial contact without device  Near counter for support & safety Sidestepping with PT cues on technique to engage BLEs properly and weight shift. Engaging hamstring with prosthetic weight bearing. -progressed to turning 90* initially just turning foot to proper location then turning to walk along counter.  -progressed to turning 180* towards counter.  PT demo & verbal cues on  technique for each. Pt return demo & verbalized understanding.    HOME EXERCISE PROGRAM: Access Code: [email or web address] URL: [email or web address] Date: 12/09/2022 Prepared by: Vladimir Faster  Exercises - Wide stance on Foam Pad head movements  -  1 x daily - 4 x weekly - 1 sets - 5-10 reps  HEP 11/04/2022 with PT demo & verbal cues. Pt & wife verbalized understanding with Pt return demo.  1.  Sidestepping right and left along counter with cues on engaging bilateral lower extremities. 10' 3 laps.  2.  Counter RUE and cane LUE -walking forward with equal step length using heel toe as marker and walking backwards with weight shift over the prosthesis in stance. 10' 3 laps.   3.  Weight shift over RLE in stance with tightening hip muscles for stabilization tapping left forefoot in lower cabinet. 10 reps 2 sets.  4. Sit to / from stand 18" chair without armrest using upper extremities lightly on seat.  PT demo and verbal cues on technique to decrease upper extremity support and increased lower extremity utilization.  10 reps 2 sets.  5.  Picking up washcloth from floor. PT demo & verbal cues on technique with TTA prosthesis and set-up for safety.  10 reps 2 sets.   ASSESSMENT: CLINICAL IMPRESSION: New bladder sleeve appears to have improved residual limb comfort.  Pt appears to understand equal weight bearing.  He appears to understand exercises above.   OBJECTIVE IMPAIRMENTS: Abnormal gait, decreased activity tolerance, decreased balance, decreased knowledge of condition, decreased knowledge of use of DME, decreased mobility, difficulty walking, decreased strength, increased edema, prosthetic dependency , and wound on residual limb .   ACTIVITY LIMITATIONS: carrying, lifting, bending, standing, squatting, stairs, transfers, locomotion level, and prosthesis use  PARTICIPATION LIMITATIONS: meal prep, cleaning, driving, community activity, occupation, yard work, and recreational  activities.  PERSONAL FACTORS: Fitness, Time since onset of injury/illness/exacerbation, and 3+ comorbidities: see PMH  are also affecting patient's functional outcome.   REHAB POTENTIAL: Good  CLINICAL DECISION MAKING: Evolving/moderate complexity  EVALUATION COMPLEXITY: Moderate   GOALS: Goals reviewed with patient? Yes  SHORT TERM GOALS: Target date: 11/20/2022  Patient donnes prosthesis modified independent & verbalizes proper cleaning. Baseline: SEE OBJECTIVE DATA Goal status: MET 11/17/2022 2.  Patient tolerates prosthesis >10 hrs total /day without skin issues increasing and no limb pain. Baseline: SEE OBJECTIVE DATA Goal status: MET 11/17/2022  3.  Berg >/= 45/56. Baseline: SEE OBJECTIVE DATA Goal status: MET 11/17/2022  4. Patient ambulates 100' with cane & prosthesis with supervision. Baseline: SEE OBJECTIVE DATA Goal status: MET 11/17/2022  5. Patient negotiates ramps & curbs with cane & prosthesis with minA. Baseline: SEE OBJECTIVE DATA Goal status: MET 11/17/2022  LONG TERM GOALS: Target date: 01/15/2023  Patient demonstrates & verbalized understanding of prosthetic care to enable safe utilization of prosthesis. Baseline: SEE OBJECTIVE DATA Goal status: Ongoing 10/27/2022  Patient tolerates prosthesis wear >90% of awake hours without skin or limb pain issues. Baseline: SEE OBJECTIVE DATA Goal status: Ongoing 10/27/2022  Functional Gait Assessment >/= 19/30 to indicate lower fall risk Baseline: SEE OBJECTIVE DATA Goal status: Ongoing 10/27/2022  Patient ambulates >500' with prosthesis only independently Baseline: SEE OBJECTIVE DATA Goal status: Ongoing 10/27/2022  Patient negotiates ramps, curbs & stairs with single rail with prosthesis only independently. Baseline: SEE OBJECTIVE DATA Goal status: Ongoing 10/27/2022  Patient demonstrates & verbalizes ability to perform yard work & golf with prosthesis only safely. Baseline: SEE OBJECTIVE DATA Goal status: Ongoing  10/27/2022   PLAN:  PT FREQUENCY: 1-2x/week  PT DURATION: 12 weeks  PLANNED INTERVENTIONS: Therapeutic exercises, Therapeutic activity, Neuromuscular re-education, Balance training, Gait training, Patient/Family education, Self Care, Stair training, Prosthetic training, Re-evaluation, and physical performance testing.  PLAN FOR  NEXT SESSION:  assess LTGs & do recert   Vladimir Faster, PT, DPT 12/30/2022, 12:54 PM

## 2023-01-13 ENCOUNTER — Encounter: Payer: Self-pay | Admitting: Physical Therapy

## 2023-01-13 ENCOUNTER — Ambulatory Visit (INDEPENDENT_AMBULATORY_CARE_PROVIDER_SITE_OTHER): Payer: 59 | Admitting: Physical Therapy

## 2023-01-13 DIAGNOSIS — R2689 Other abnormalities of gait and mobility: Secondary | ICD-10-CM | POA: Diagnosis not present

## 2023-01-13 DIAGNOSIS — R2681 Unsteadiness on feet: Secondary | ICD-10-CM | POA: Diagnosis not present

## 2023-01-13 DIAGNOSIS — M6281 Muscle weakness (generalized): Secondary | ICD-10-CM | POA: Diagnosis not present

## 2023-01-13 DIAGNOSIS — L98498 Non-pressure chronic ulcer of skin of other sites with other specified severity: Secondary | ICD-10-CM

## 2023-01-13 NOTE — Therapy (Addendum)
OUTPATIENT PHYSICAL THERAPY TREATMENT   Patient Name: Clinton [name] MRN: [id number] DOB:1956-05-22, 67 y.o., male Today's Date: 01/13/2023  PCP: Darrow Bussing, MD REFERRING PROVIDER: Delle Reining, PA-C    END OF SESSION:  PT End of Session - 01/13/23 0756     Visit Number 15    Number of Visits 20    Date for PT Re-Evaluation 04/09/23    Authorization Type [employer]    Authorization Time Period 20 PT visits, 10% co-insurance    Authorization - Visit Number 15    Authorization - Number of Visits 20    Progress Note Due on Visit 20    PT Start Time 0756    PT Stop Time 0843    PT Time Calculation (min) 47 min    Activity Tolerance Patient tolerated treatment well    Behavior During Therapy [facility] for tasks assessed/performed                     Past Medical History:  Diagnosis Date   Carotid artery occlusion    Cellulitis    Erectile dysfunction    Hyperlipidemia    Hypertension    Peripheral vascular disease (HCC)    Past Surgical History:  Procedure Laterality Date   ABDOMINAL AORTOGRAM W/LOWER EXTREMITY N/A 02/04/2022   Procedure: ABDOMINAL AORTOGRAM W/LOWER EXTREMITY;  Surgeon: Nada Libman, MD;  Location: [facility];  Service: Cardiovascular;  Laterality: N/A;   ABDOMINAL AORTOGRAM W/LOWER EXTREMITY N/A 02/18/2022   Procedure: ABDOMINAL AORTOGRAM W/LOWER EXTREMITY;  Surgeon: Nada Libman, MD;  Location: [facility];  Service: Cardiovascular;  Laterality: N/A;   ABDOMINAL AORTOGRAM W/LOWER EXTREMITY N/A 04/15/2022   Procedure: ABDOMINAL AORTOGRAM W/LOWER EXTREMITY;  Surgeon: Nada Libman, MD;  Location: [facility];  Service: Cardiovascular;  Laterality: N/A;   ABDOMINAL AORTOGRAM W/LOWER EXTREMITY N/A 10/28/2022   Procedure: ABDOMINAL AORTOGRAM W/LOWER EXTREMITY;  Surgeon: Nada Libman, MD;  Location: [facility];  Service: Cardiovascular;  Laterality: N/A;   AMPUTATION Right 04/16/2022   Procedure: RIGHT 5TH RAY  AMPUTATION;  Surgeon: Nadara Mustard, MD;  Location: [facility] OR;  Service: Orthopedics;  Laterality: Right;   AMPUTATION Right 05/09/2022   Procedure: RIGHT BELOW KNEE AMPUTATION;  Surgeon: Nadara Mustard, MD;  Location: [facility] OR;  Service: Orthopedics;  Laterality: Right;   APPLICATION OF WOUND VAC Right 06/20/2022   Procedure: APPLICATION OF WOUND VAC;  Surgeon: Nadara Mustard, MD;  Location: [facility];  Service: Orthopedics;  Laterality: Right;   COLONOSCOPY     ENDARTERECTOMY Right 09/05/2015   Procedure: ENDARTERECTOMY CAROTID RIGHT;  Surgeon: Sherren Kerns, MD;  Location: [facility] OR;  Service: Vascular;  Laterality: Right;   PATCH ANGIOPLASTY  09/05/2015   Procedure: PATCH ANGIOPLASTY RIGHT CAROTID ARTERY USING HEMASHIELD PLATINUM FINESSE PATCH;  Surgeon: Sherren Kerns, MD;  Location: [facility] OR;  Service: Vascular;;   PERIPHERAL INTRAVASCULAR LITHOTRIPSY Left 10/28/2022   Procedure: INTRAVASCULAR LITHOTRIPSY;  Surgeon: Nada Libman, MD;  Location: [facility];  Service: Cardiovascular;  Laterality: Left;  L peroneal   PERIPHERAL VASCULAR ATHERECTOMY Left 02/18/2022   Procedure: PERIPHERAL VASCULAR ATHERECTOMY;  Surgeon: Nada Libman, MD;  Location: [facility];  Service: Cardiovascular;  Laterality: Left;  Popliteal and Peroneal   PERIPHERAL VASCULAR BALLOON ANGIOPLASTY  02/04/2022   Procedure: PERIPHERAL VASCULAR BALLOON ANGIOPLASTY;  Surgeon: Nada Libman, MD;  Location: [facility];  Service: Cardiovascular;;  PERIPHERAL VASCULAR BALLOON ANGIOPLASTY Right 04/15/2022   Procedure: PERIPHERAL VASCULAR BALLOON ANGIOPLASTY;  Surgeon: Nada Libman, MD;  Location: [facility];  Service: Cardiovascular;  Laterality: Right;  SFA/POPLITEAL   PERIPHERAL VASCULAR BALLOON ANGIOPLASTY Left 10/28/2022   Procedure: PERIPHERAL VASCULAR BALLOON ANGIOPLASTY;  Surgeon: Nada Libman, MD;  Location: [facility];  Service: Cardiovascular;  Laterality: Left;  L peroneal and L pop    STUMP REVISION Right 06/20/2022   Procedure: REVISION RIGHT BELOW KNEE AMPUTATION;  Surgeon: Nadara Mustard, MD;  Location: [facility] OR;  Service: Orthopedics;  Laterality: Right;   TONSILLECTOMY     VASECTOMY     Patient Active Problem List   Diagnosis Date Noted   Elevated coronary artery calcium score 08/06/2022   Dehiscence of amputation stump of right lower extremity (HCC) 06/20/2022   Acute blood loss anemia 05/19/2022   Post-op pain 05/19/2022   Acute renal failure (HCC) 05/19/2022   S/P BKA (below knee amputation), right (HCC) 05/14/2022   Below-knee amputation of right lower extremity (HCC) 05/09/2022   Gangrene of right foot (HCC)    PAD (peripheral artery disease) (HCC) 04/17/2022   Acute osteomyelitis of metatarsal bone of right foot (HCC)    Atherosclerosis of native arteries of the extremities with ulceration (HCC) 04/15/2022   Peripheral arterial disease (HCC) 03/25/2022   Obesity 03/21/2022   Prediabetes 03/21/2022   Erectile dysfunction 07/26/2021   Family history of ischemic heart disease (IHD) 07/26/2021   Pure hypercholesterolemia 07/26/2021   History of adenomatous polyp of colon 02/03/2019   Internal hemorrhoids 02/03/2019   Carotid artery stenosis, asymptomatic 09/05/2015   Cellulitis    Hypertension    Hyperlipidemia    Occlusion and stenosis of carotid artery without mention of cerebral infarction 01/01/2012    ONSET DATE: 10/15/2022 prosthesis delivery  REFERRING DIAG: Z61.096E (ICD-10-CM) - Below-knee amputation of right lower extremity   THERAPY DIAG:  Other abnormalities of gait and mobility  Unsteadiness on feet  Muscle weakness (generalized)  Non-pressure chronic ulcer of skin of other sites with other specified severity (HCC)  Rationale for Evaluation and Treatment: Rehabilitation  SUBJECTIVE:   SUBJECTIVE STATEMENT: He has not been using bladder system much. He uses it couple hours every other day.  He is using socks.  He has not used cane  in last 5 days.   He threw baseball some with grandsons.     PERTINENT HISTORY: carotid artery occlusion, HTN, PVD, BLE vascular sugeries  PAIN:  Are you having pain? Limb pain 0/10 with weight bearing in last week up to 4/10  PRECAUTIONS: None  WEIGHT BEARING RESTRICTIONS: No  FALLS: Has patient fallen in last 6 months? No  LIVING ENVIRONMENT: Lives with: lives with their spouse Lives in: House  single level Home Access: Stairs to enter Home layout: One level Stairs: Yes: External: 3 steps; can reach both Has following equipment at home: Single point cane, Walker - 2 wheeled, Wheelchair (manual), shower chair, and knee scooter  PLOF: Independent, Independent with household mobility without device, and Independent with community mobility without device  circulation issues started in 2019 with worsening 2021,   PATIENT GOALS:   to use prosthesis to travel, golf, climb ladder, go to gym, active in community including coaching,    OBJECTIVE:  COGNITION: Overall cognitive status: Within functional limits for tasks assessed   SENSATION: WFL  POSTURE: flexed trunk  and weight shift left  LOWER EXTREMITY ROM:  ROM P:passive  A:active Right eval  Hip flexion   Hip extension   Hip abduction   Hip adduction   Hip internal rotation   Hip external rotation   Knee flexion WFL  Knee extension WFL  Ankle dorsiflexion   Ankle plantarflexion   Ankle inversion   Ankle eversion    (Blank rows = not tested)  LOWER EXTREMITY MMT:  MMT Right eval Left eval  Hip flexion 5/5 5/5  Hip extension 4/5   Hip abduction 4/5   Hip adduction    Hip internal rotation    Hip external rotation    Knee flexion 4/5   Knee extension 4/5   Ankle dorsiflexion    Ankle plantarflexion    Ankle inversion    Ankle eversion    (Blank rows = not tested)  TRANSFERS: 11/17/2022: sit to /from stand: independent without UE assist  10/22/2022 / Eval: Sit to stand: Modified independence Stand to  sit: Modified independence  GAIT: 01/13/2023: Gait Velocity: no device comfortable / self-selected pace 3.66 ft/sec and fast pace 5.60 ft/sec Pt amb 500' without device with cues only.  Pt neg ramp & curb without device with supervision / guarded motions. Pt neg stairs alternating pattern with single rail with cues for technique.    11/17/2022:  Pt amb community distances with cane including neg ramps & curbs safely.  Pt amb up to 200' (residual limb pain limits distances) without device safely with verbal cues only to correct deviations. Gait Velocity: self-selected 2.13 ft/sec & fast pace 2.67 ft/sec Functional Gait Assessment: 10/30  10/22/2022 / Eval: Gait pattern: step to pattern, decreased step length- Left, decreased stance time- Right, decreased hip/knee flexion- Right, Right hip hike, knee flexed in stance- Right, antalgic, trunk flexed, and abducted- Right Distance walked: 50' Assistive device utilized: Environmental consultant - 2 wheeled and TTA prosthesis Level of assistance: SBA / cues for deviations no balance issues noted with RW Comments: due to wound issues PT recommending RW for BUE support initially  FUNCTIONAL TESTs:  01/13/2023: Timed Up & Go without device except prosthesis: standard 8.94 sec and cognitive 8.94 sec Four Square Step Test 14.18 & 13.50 sec both indicating fall risk Functional Gait Assessment 16/30 without device. Indicates high fall risk.   [facility] PT Assessment - 01/13/23 0800       Standardized Balance Assessment   Standardized Balance Assessment Timed Up and Go Test;Four Square Step Test      Timed Up and Go Test   Normal TUG (seconds) 8.94    Cognitive TUG (seconds) 8.94   math with one error     Four Square Step Test    Trial One  14.18    Trial Two 13.50      Functional Gait  Assessment   Gait assessed  Yes    Gait Level Surface Walks 20 ft in less than 5.5 sec, no assistive devices, good speed, no evidence for imbalance, normal gait pattern, deviates no  more than 6 in outside of the 12 in walkway width.    Change in Gait Speed Able to change speed, demonstrates mild gait deviations, deviates 6-10 in outside of the 12 in walkway width, or no gait deviations, unable to achieve a major change in velocity, or uses a change in velocity, or uses an assistive device.    Gait with Horizontal Head Turns Performs head turns smoothly with slight change in gait velocity (eg, minor disruption to smooth gait path), deviates 6-10 in outside 12 in walkway width, or uses an assistive device.  Gait with Vertical Head Turns Performs task with slight change in gait velocity (eg, minor disruption to smooth gait path), deviates 6 - 10 in outside 12 in walkway width or uses assistive device    Gait and Pivot Turn Pivot turns safely in greater than 3 sec and stops with no loss of balance, or pivot turns safely within 3 sec and stops with mild imbalance, requires small steps to catch balance.    Step Over Obstacle Is able to step over one shoe box (4.5 in total height) but must slow down and adjust steps to clear box safely. May require verbal cueing.    Gait with Narrow Base of Support Ambulates less than 4 steps heel to toe or cannot perform without assistance.    Gait with Eyes Closed Walks 20 ft, slow speed, abnormal gait pattern, evidence for imbalance, deviates 10-15 in outside 12 in walkway width. Requires more than 9 sec to ambulate 20 ft.    Ambulating Backwards Walks 20 ft, slow speed, abnormal gait pattern, evidence for imbalance, deviates 10-15 in outside 12 in walkway width.    Steps Alternating feet, must use rail.    Total Score 16    FGA comment: Max score 30  Interpretation: 25-28 = low risk fall   19-24 = medium risk fall  < 19 = high risk fall             11/17/2022: Berg Balance 50/56 & Functional Gait Assessment 10/30  Eval / 10/22/2022:  Sharlene Motts Balance Scale: 36/56  CURRENT PROSTHETIC WEAR ASSESSMENT: 01/13/2023: patient tolerates prosthesis wear  >90% of awake hours. He has wound with scab at distal tibia present which is healing still.  He has limb pain 0-4/10 with prosthesis wear & weight bearing but significant improvement.  Pt is independent with: skin check, residual limb care, prosthetic cleaning, ply sock cleaning, correct ply sock adjustment, proper wear schedule/adjustment, and proper weight-bearing schedule/adjustment   11/17/2022: Pt wearing prosthesis most of awake hours except bathing & last 1-1.5 hours of day. Wound scab at distal tibia. PT continues to advise / problem-solve limb pain & other prosthetic issues.    Eval / 10/22/2022: Patient is dependent with: skin check, residual limb care, prosthetic cleaning, ply sock cleaning, correct ply sock adjustment, proper wear schedule/adjustment, and proper weight-bearing schedule/adjustment Donning prosthesis: SBA Doffing prosthesis: Modified independence Prosthetic wear tolerance: 1-1.5 hours, 2x/day, for last 2 days.  He wore prosthesis 1hr 2x on 3/8 and got water blisters.  Stopped wear on 3/9 & 3/10, resumed 3/11 1.5 hrs 2x/day for 2 days.  Prosthetic weight bearing tolerance: 5 minutes Edema: pitting Residual limb condition: draining (serous) wound at distal tibia, 2nd open blister on distal limb, minimal hair growth, cylindrical shape, no warmth or abnormal color changes noted.  Prosthetic description: silicon liner with pin lock suspension, total contact socket with flexible inner liner, 0-ply socks, dynamic response foot K code/activity level with prosthetic use: Level 3    TODAY'S TREATMENT:  DATE:  01/13/2023: Prosthetic Training with TTA prosthesis: See objective data. PT reviewed results and indications for function.  PT recommended & verbal cues on stepping over obstacles, curb leading with either LE, scanning during gait, alternating 3 steps  eyes open & eyes closed, picking up pace and stairs alternating pattern. Pt verbalized & return demo understanding.  Prosthesis appears 3/8" too short. PT recommended appt with prosthetist to correct and may need temporary lift in left shoe if catching toe until he accommodates. Pt verbalized understanding.    12/30/2022: Prosthetic Training with TTA prosthesis: PT demo & verbal cues on locating bladder sleeve so pressure will not be on tibial plateau or crest. Pt verbalized understanding.  Used mirror to work on equal weight bearing BLEs with pelvic / trunk shift with hip width stance for standing ADLs and wider for sports. Focus on weight shift onto prosthesis before initiating swing.  Simulated baseball & golf swing with proper weight shift. Tossing ball with LLE step then RLE step through.  Pt verbalized understanding. PT demo & verbal cues on focus for sound foot heel rise prior to stepping to facilitate weight shift directly over prosthesis in stance. Pt able to return demo understanding and reports improved comfort with prosthesis.    Therapeutic Exercise: Leg Press BLEs 100# 10 reps with PT cueing use at gym. Hamstring stretch seated with strap DF trunk flexion 30 sec hold 1 rep Standing gastroc with forefoot on 2" step 30 sec hold 1 rep Quad stretch supine hooklying with strap knee flexion 30 sec hold 1 rep Pt verbalized understanding of above.    12/23/2022: Prosthetic Training with TTA prosthesis: Stationary stance with equal weight bearing BLEs. Can touch item on right side to facilitate weight on RLE. Pt return demo understanding.  PT instructed patient in concerns for volume changes between the end of the day and the next morning.  He reports using 5 wear shrinker against the residual limb and only 1 ply of tubal compression sock over the top.  PT recommended using double table compression sock with a goal to have less difference in morning ply sock fit compared to end of day.   Patient verbalized understanding Patient ambulated 300 feet x 2 without an assist device working on scanning and maintaining path. PT demo and educated on stepping over obstacle.  Patient able to return demonstration without assistive device with supervision. Patient negotiated ramp and curb without an assistive device with supervision.  PT cueing to improve technique.  Neuromuscular reeducation: Standing in corner with equal weightbearing bilateral lower extremities feet hip with apart initiating each activity/head movement with equal weightbearing.  Head motions right/left, up/down and diagonals 5 reps each eyes open on floor, eyes closed on floor and eyes open on foam.  Intermittent touch for balance.   HOME EXERCISE PROGRAM: Access Code: [email or web address] URL: [email or web address] Date: 12/09/2022 Prepared by: Vladimir Faster  Exercises - Wide stance on Foam Pad head movements  - 1 x daily - 4 x weekly - 1 sets - 5-10 reps  HEP 11/04/2022 with PT demo & verbal cues. Pt & wife verbalized understanding with Pt return demo.  1.  Sidestepping right and left along counter with cues on engaging bilateral lower extremities. 10' 3 laps.  2.  Counter RUE and cane LUE -walking forward with equal step length using heel toe as marker and walking backwards with weight shift over the prosthesis in stance. 10' 3 laps.   3.  Weight shift over RLE in stance with  tightening hip muscles for stabilization tapping left forefoot in lower cabinet. 10 reps 2 sets.  4. Sit to / from stand 18" chair without armrest using upper extremities lightly on seat.  PT demo and verbal cues on technique to decrease upper extremity support and increased lower extremity utilization.  10 reps 2 sets.  5.  Picking up washcloth from floor. PT demo & verbal cues on technique with TTA prosthesis and set-up for safety.  10 reps 2 sets.   ASSESSMENT: CLINICAL IMPRESSION: Patient has made excellent progress with function & use  of his prosthesis but would benefit from additional instruction to maximize function & reduce fall risk.  He is able to tolerate wear most of awake hours but still has a wound & limb pain issues that need PT guidance. Functional Gait Assessment and Four Square step test both indicate high fall risk.   OBJECTIVE IMPAIRMENTS: Abnormal gait, decreased activity tolerance, decreased balance, decreased knowledge of condition, decreased knowledge of use of DME, decreased mobility, difficulty walking, decreased strength, increased edema, prosthetic dependency , and wound on residual limb .   ACTIVITY LIMITATIONS: carrying, lifting, bending, standing, squatting, stairs, transfers, locomotion level, and prosthesis use  PARTICIPATION LIMITATIONS: meal prep, cleaning, driving, community activity, occupation, yard work, and recreational activities.  PERSONAL FACTORS: Fitness, Time since onset of injury/illness/exacerbation, and 3+ comorbidities: see PMH  are also affecting patient's functional outcome.   REHAB POTENTIAL: Good  CLINICAL DECISION MAKING: Evolving/moderate complexity  EVALUATION COMPLEXITY: Moderate   GOALS: Goals reviewed with patient? Yes  SHORT TERM GOALS: Target date: visit 18  Patient able to negotiate ramp & curb without device safely.  Baseline: SEE OBJECTIVE DATA Goal status: set 01/13/2023 2.  Patient tolerates prosthesis >90% awake hours /day and weight bearing activities throughout day with limb pain </=2/10. Baseline: SEE OBJECTIVE DATA Goal status: set 01/13/2023  3.  Functional Gait Assessment >/=19/30 Baseline: SEE OBJECTIVE DATA Goal status: set 01/13/2023  4. Patient negotiates stairs with single rail alternating pattern modified independent.  Baseline: SEE OBJECTIVE DATA Goal status: set 01/13/2023   LONG TERM GOALS: Target date: 01/15/2023  Patient demonstrates & verbalized understanding of prosthetic care to enable safe utilization of prosthesis. Baseline: SEE  OBJECTIVE DATA Goal status: MET 01/13/2023  Patient tolerates prosthesis wear >90% of awake hours without skin or limb pain issues. Baseline: SEE OBJECTIVE DATA Goal status: ongoing 01/13/2023 time met but wound & pain still present  Functional Gait Assessment >/= 19/30 to indicate lower fall risk Baseline: SEE OBJECTIVE DATA improved to 16/30 from 10/30 Goal status: NOT MET 01/13/2023 but progressing  Patient ambulates >500' with prosthesis only independently Baseline: SEE OBJECTIVE DATA Goal status: MET 01/13/2023  Patient negotiates ramps, curbs & stairs with single rail with prosthesis only independently. Baseline: SEE OBJECTIVE DATA Goal status: NOT MET 01/13/2023 but progressing  Patient demonstrates & verbalizes ability to perform yard work & golf with prosthesis only safely. Baseline: SEE OBJECTIVE DATA Goal status: NOT MET 01/13/2023 but progressing   UPDATED LONG TERM GOALS: Target date: 04/09/2023   Patient tolerates prosthesis wear >90% of awake hours without skin or limb pain issues. Baseline: SEE OBJECTIVE DATA Goal status: ongoing 01/13/2023  2.  Functional Gait Assessment >/= 22/30 to indicate lower fall risk Baseline: SEE OBJECTIVE DATA  Goal status: updated 01/13/2023  3.  Patient ambulates >1000' with prosthesis only independently Baseline: SEE OBJECTIVE DATA Goal status: updated 01/13/2023  4.  Patient negotiates ramps, curbs & stairs with single rail with prosthesis only  independently. Baseline: SEE OBJECTIVE DATA Goal status: updated 01/13/2023  5.  Patient demonstrates & verbalizes ability to perform yard work & golf with prosthesis only safely. Baseline: SEE OBJECTIVE DATA Goal status: ongoing 01/13/2023  6.  Four Square Step Test <10 sec safely to indicate lower fall risk.  Baseline: SEE OBJECTIVE DATA Goal status:  set 01/13/2023  PLAN:  PT FREQUENCY: 1x every 1-2 weeks  PT DURATION: 5 additional visits  PLANNED INTERVENTIONS: Therapeutic exercises,  Therapeutic activity, Neuromuscular re-education, Balance training, Gait training, Patient/Family education, Self Care, Stair training, Prosthetic training, Re-evaluation, and physical performance testing.  PLAN FOR NEXT SESSION:  progress gait & balance activities in resistance, work towards updated STGs.    Vladimir Faster, PT, DPT 01/13/2023, 10:01 AM

## 2023-01-22 ENCOUNTER — Ambulatory Visit (INDEPENDENT_AMBULATORY_CARE_PROVIDER_SITE_OTHER): Payer: 59 | Admitting: Orthopedic Surgery

## 2023-01-22 ENCOUNTER — Ambulatory Visit: Payer: 59 | Admitting: Orthopedic Surgery

## 2023-01-22 DIAGNOSIS — S88111A Complete traumatic amputation at level between knee and ankle, right lower leg, initial encounter: Secondary | ICD-10-CM

## 2023-01-22 DIAGNOSIS — Z89511 Acquired absence of right leg below knee: Secondary | ICD-10-CM

## 2023-01-28 ENCOUNTER — Encounter: Payer: Self-pay | Admitting: Orthopedic Surgery

## 2023-01-28 NOTE — Progress Notes (Signed)
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  He is currently wearing his prosthesis.  He has 3 physical therapy visits left.  He feels well.  He has been wearing his prosthesis for 3 months.  Patient states he tried a narrow bladder support that was not successful.  Assessment & Plan: Visit Diagnoses:  1. Below-knee amputation of right lower extremity, initial encounter [facility])     Plan: Continue with physical therapy follow-up with [facility] if he develops any full-thickness ulcers.  Follow-Up Instructions: Return in about 4 weeks (around 02/19/2023).   Ortho Exam  Patient is alert, oriented, no adenopathy, well-dressed, normal affect, normal respiratory effort. Examination there is a small 3 mm area of skin breakdown on the residual limb.  There is no redness no cellulitis no drainage.  Imaging: No results found. No images are attached to the encounter.  Labs: Lab Results  Component Value Date   HGBA1C 5.4 04/15/2022   ESRSEDRATE 14 04/08/2022   CRP 2.1 04/08/2022   REPTSTATUS 06/25/2022 FINAL 06/20/2022   GRAMSTAIN  06/20/2022    ABUNDANT WBC PRESENT,BOTH PMN AND MONONUCLEAR NO ORGANISMS SEEN    CULT  06/20/2022    RARE HAEMOPHILUS PARAINFLUENZAE BETA LACTAMASE NEGATIVE FEW GEMELLA MORBILLORUM Standardized susceptibility testing for this organism is not available. Performed at [facility], [address] [place], [place] [address]      Lab Results  Component Value Date   ALBUMIN 3.6 06/20/2022   ALBUMIN 2.3 (L) 05/15/2022    ALBUMIN 3.9 08/27/2015    No results found for: "MG" No results found for: "VD25OH"  No results found for: "PREALBUMIN"    Latest Ref Rng & Units 10/28/2022   10:05 AM 06/20/2022   11:45 AM 05/15/2022    5:14 AM  CBC EXTENDED  WBC 4.0 - 10.5 K/uL  6.3  5.6   RBC 4.22 - 5.81 MIL/uL  3.85  2.55   Hemoglobin 13.0 - 17.0 g/dL 01.0  27.2  8.1   HCT 53.6 - 52.0 % 38.0  36.3  24.2   Platelets 150 - 400 K/uL  231  218   NEUT# 1.7 - 7.7 K/uL  4.2  3.6   Lymph# 0.7 - 4.0 K/uL  1.5  1.3      There is no height or weight on file to calculate BMI.  Orders:  No orders of the defined types were placed in this encounter.  No orders of the defined types were placed in this encounter.    Procedures: No procedures performed  Clinical Data: No additional findings.  ROS:  All other systems negative, except as noted in the HPI. Review of Systems  Objective: Vital Signs: There were no vitals taken for this visit.  Specialty Comments:  No specialty comments available.  PMFS History: Patient Active Problem List   Diagnosis Date Noted   Elevated coronary artery calcium  score 08/06/2022   Dehiscence of amputation stump of right lower extremity (HCC) 06/20/2022   Acute blood loss anemia 05/19/2022   Post-op pain 05/19/2022   Acute renal failure (HCC) 05/19/2022   S/P BKA (below knee amputation), right (HCC) 05/14/2022   Below-knee amputation of right lower extremity (HCC) 05/09/2022   Gangrene of right foot (HCC)    PAD (peripheral artery disease) (HCC) 04/17/2022   Acute osteomyelitis of metatarsal bone of right foot (HCC)    Atherosclerosis of native arteries of the extremities with ulceration (HCC) 04/15/2022   Peripheral arterial disease (HCC) 03/25/2022   Obesity 03/21/2022   Prediabetes 03/21/2022   Erectile dysfunction 07/26/2021   Family history of ischemic heart disease (IHD) 07/26/2021   Pure hypercholesterolemia 07/26/2021   History of adenomatous polyp of colon  02/03/2019   Internal hemorrhoids 02/03/2019   Carotid artery stenosis, asymptomatic 09/05/2015   Cellulitis    Hypertension    Hyperlipidemia    Occlusion and stenosis of carotid artery without mention of cerebral infarction 01/01/2012   Past Medical History:  Diagnosis Date   Carotid artery occlusion    Cellulitis    Erectile dysfunction    Hyperlipidemia    Hypertension    Peripheral vascular disease (HCC)     Family History  Problem Relation Age of Onset   Other Mother        circulatory problems   Heart attack Mother    Deep vein thrombosis Mother    Heart disease Mother    Hyperlipidemia Father    Hypertension Father    Diabetes Father    Deep vein thrombosis Father    Heart disease Father        before age 40   Heart attack Father    Peripheral vascular disease Father    Hypertension Brother    Hyperlipidemia Brother     Past Surgical History:  Procedure Laterality Date   ABDOMINAL AORTOGRAM W/LOWER EXTREMITY N/A 02/04/2022   Procedure: ABDOMINAL AORTOGRAM W/LOWER EXTREMITY;  Surgeon: Nada Libman, MD;  Location: [facility];  Service: Cardiovascular;  Laterality: N/A;   ABDOMINAL AORTOGRAM W/LOWER EXTREMITY N/A 02/18/2022   Procedure: ABDOMINAL AORTOGRAM W/LOWER EXTREMITY;  Surgeon: Nada Libman, MD;  Location: [facility];  Service: Cardiovascular;  Laterality: N/A;   ABDOMINAL AORTOGRAM W/LOWER EXTREMITY N/A 04/15/2022   Procedure: ABDOMINAL AORTOGRAM W/LOWER EXTREMITY;  Surgeon: Nada Libman, MD;  Location: [facility];  Service: Cardiovascular;  Laterality: N/A;   ABDOMINAL AORTOGRAM W/LOWER EXTREMITY N/A 10/28/2022   Procedure: ABDOMINAL AORTOGRAM W/LOWER EXTREMITY;  Surgeon: Nada Libman, MD;  Location: [facility];  Service: Cardiovascular;  Laterality: N/A;   AMPUTATION Right 04/16/2022   Procedure: RIGHT 5TH RAY AMPUTATION;  Surgeon: Nadara Mustard, MD;  Location: [facility] OR;  Service: Orthopedics;  Laterality: Right;    AMPUTATION Right 05/09/2022   Procedure: RIGHT BELOW KNEE AMPUTATION;  Surgeon: Nadara Mustard, MD;  Location: [facility] OR;  Service: Orthopedics;  Laterality: Right;   APPLICATION OF WOUND VAC Right 06/20/2022   Procedure: APPLICATION OF WOUND VAC;  Surgeon: Nadara Mustard, MD;  Location: [facility];  Service: Orthopedics;  Laterality: Right;   COLONOSCOPY     ENDARTERECTOMY Right 09/05/2015   Procedure: ENDARTERECTOMY CAROTID RIGHT;  Surgeon: Sherren Kerns, MD;  Location: [facility] OR;  Service: Vascular;  Laterality: Right;   PATCH ANGIOPLASTY  09/05/2015   Procedure: PATCH ANGIOPLASTY RIGHT CAROTID ARTERY USING HEMASHIELD PLATINUM FINESSE PATCH;  Surgeon: Sherren Kerns, MD;  Location: [facility] OR;  Service: Vascular;;   PERIPHERAL INTRAVASCULAR LITHOTRIPSY Left 10/28/2022   Procedure: INTRAVASCULAR LITHOTRIPSY;  Surgeon: Nada Libman, MD;  Location: [facility];  Service: Cardiovascular;  Laterality: Left;  L peroneal   PERIPHERAL VASCULAR ATHERECTOMY Left 02/18/2022   Procedure: PERIPHERAL VASCULAR ATHERECTOMY;  Surgeon: Nada Libman, MD;  Location: [facility];  Service: Cardiovascular;  Laterality: Left;  Popliteal and Peroneal   PERIPHERAL VASCULAR BALLOON ANGIOPLASTY  02/04/2022   Procedure: PERIPHERAL VASCULAR BALLOON ANGIOPLASTY;  Surgeon: Nada Libman, MD;  Location: [facility];  Service: Cardiovascular;;   PERIPHERAL VASCULAR BALLOON ANGIOPLASTY Right 04/15/2022   Procedure: PERIPHERAL VASCULAR BALLOON ANGIOPLASTY;  Surgeon: Nada Libman, MD;  Location: [facility];  Service: Cardiovascular;  Laterality: Right;  SFA/POPLITEAL   PERIPHERAL VASCULAR BALLOON ANGIOPLASTY Left 10/28/2022   Procedure: PERIPHERAL VASCULAR BALLOON ANGIOPLASTY;  Surgeon: Nada Libman, MD;  Location: [facility];  Service: Cardiovascular;  Laterality: Left;  L peroneal and L pop   STUMP REVISION Right 06/20/2022   Procedure: REVISION RIGHT BELOW KNEE AMPUTATION;  Surgeon: Nadara Mustard, MD;  Location: [facility] OR;  Service: Orthopedics;  Laterality: Right;   TONSILLECTOMY     VASECTOMY     Social History   Occupational History   Not on file  Tobacco Use   Smoking status: Never   Smokeless tobacco: Former    Types: Chew    Quit date: 01/01/2015  Vaping Use   Vaping Use: Never used  Substance and Sexual Activity   Alcohol use: Not Currently   Drug use: Not on file   Sexual activity: Not on file

## 2023-02-03 ENCOUNTER — Encounter: Payer: Self-pay | Admitting: Physical Therapy

## 2023-02-03 ENCOUNTER — Ambulatory Visit (INDEPENDENT_AMBULATORY_CARE_PROVIDER_SITE_OTHER): Payer: 59 | Admitting: Physical Therapy

## 2023-02-03 DIAGNOSIS — R2681 Unsteadiness on feet: Secondary | ICD-10-CM

## 2023-02-03 DIAGNOSIS — L98498 Non-pressure chronic ulcer of skin of other sites with other specified severity: Secondary | ICD-10-CM

## 2023-02-03 DIAGNOSIS — M6281 Muscle weakness (generalized): Secondary | ICD-10-CM | POA: Diagnosis not present

## 2023-02-03 DIAGNOSIS — R2689 Other abnormalities of gait and mobility: Secondary | ICD-10-CM | POA: Diagnosis not present

## 2023-02-03 NOTE — Patient Instructions (Signed)
stepping over obstacles - lead with prosthesis to see it clear. curb leading with either LE - try to maintain momentum scanning during gait _ both lines of T & X goal is to maintain path & pace alternating 3 steps eyes open & eyes closed picking up pace  stairs alternating pattern 4 square stepping clockwise & counterclockwise

## 2023-02-03 NOTE — Therapy (Signed)
OUTPATIENT PHYSICAL THERAPY TREATMENT   Patient Name: Clinton [name] MRN: [id number] DOB:1956-06-20, 67 y.o., male Today's Date: 02/03/2023  PCP: Darrow Bussing, MD REFERRING PROVIDER: Delle Reining, PA-C    END OF SESSION:  PT End of Session - 02/03/23 0756     Visit Number 16    Number of Visits 20    Date for PT Re-Evaluation 04/09/23    Authorization Type [employer]    Authorization Time Period 20 PT visits, 10% co-insurance    Authorization - Visit Number 16    Authorization - Number of Visits 20    Progress Note Due on Visit 20    PT Start Time 0756    PT Stop Time 0844    PT Time Calculation (min) 48 min    Activity Tolerance Patient tolerated treatment well    Behavior During Therapy [facility] for tasks assessed/performed                      Past Medical History:  Diagnosis Date   Carotid artery occlusion    Cellulitis    Erectile dysfunction    Hyperlipidemia    Hypertension    Peripheral vascular disease (HCC)    Past Surgical History:  Procedure Laterality Date   ABDOMINAL AORTOGRAM W/LOWER EXTREMITY N/A 02/04/2022   Procedure: ABDOMINAL AORTOGRAM W/LOWER EXTREMITY;  Surgeon: Nada Libman, MD;  Location: [facility];  Service: Cardiovascular;  Laterality: N/A;   ABDOMINAL AORTOGRAM W/LOWER EXTREMITY N/A 02/18/2022   Procedure: ABDOMINAL AORTOGRAM W/LOWER EXTREMITY;  Surgeon: Nada Libman, MD;  Location: [facility];  Service: Cardiovascular;  Laterality: N/A;   ABDOMINAL AORTOGRAM W/LOWER EXTREMITY N/A 04/15/2022   Procedure: ABDOMINAL AORTOGRAM W/LOWER EXTREMITY;  Surgeon: Nada Libman, MD;  Location: [facility];  Service: Cardiovascular;  Laterality: N/A;   ABDOMINAL AORTOGRAM W/LOWER EXTREMITY N/A 10/28/2022   Procedure: ABDOMINAL AORTOGRAM W/LOWER EXTREMITY;  Surgeon: Nada Libman, MD;  Location: [facility];  Service: Cardiovascular;  Laterality: N/A;   AMPUTATION Right 04/16/2022   Procedure: RIGHT 5TH RAY  AMPUTATION;  Surgeon: Nadara Mustard, MD;  Location: [facility] OR;  Service: Orthopedics;  Laterality: Right;   AMPUTATION Right 05/09/2022   Procedure: RIGHT BELOW KNEE AMPUTATION;  Surgeon: Nadara Mustard, MD;  Location: [facility] OR;  Service: Orthopedics;  Laterality: Right;   APPLICATION OF WOUND VAC Right 06/20/2022   Procedure: APPLICATION OF WOUND VAC;  Surgeon: Nadara Mustard, MD;  Location: [facility];  Service: Orthopedics;  Laterality: Right;   COLONOSCOPY     ENDARTERECTOMY Right 09/05/2015   Procedure: ENDARTERECTOMY CAROTID RIGHT;  Surgeon: Sherren Kerns, MD;  Location: [facility] OR;  Service: Vascular;  Laterality: Right;   PATCH ANGIOPLASTY  09/05/2015   Procedure: PATCH ANGIOPLASTY RIGHT CAROTID ARTERY USING HEMASHIELD PLATINUM FINESSE PATCH;  Surgeon: Sherren Kerns, MD;  Location: [facility] OR;  Service: Vascular;;   PERIPHERAL INTRAVASCULAR LITHOTRIPSY Left 10/28/2022   Procedure: INTRAVASCULAR LITHOTRIPSY;  Surgeon: Nada Libman, MD;  Location: [facility];  Service: Cardiovascular;  Laterality: Left;  L peroneal   PERIPHERAL VASCULAR ATHERECTOMY Left 02/18/2022   Procedure: PERIPHERAL VASCULAR ATHERECTOMY;  Surgeon: Nada Libman, MD;  Location: [facility];  Service: Cardiovascular;  Laterality: Left;  Popliteal and Peroneal   PERIPHERAL VASCULAR BALLOON ANGIOPLASTY  02/04/2022   Procedure: PERIPHERAL VASCULAR BALLOON ANGIOPLASTY;  Surgeon: Nada Libman, MD;  Location: [facility];  Service: Cardiovascular;;  PERIPHERAL VASCULAR BALLOON ANGIOPLASTY Right 04/15/2022   Procedure: PERIPHERAL VASCULAR BALLOON ANGIOPLASTY;  Surgeon: Nada Libman, MD;  Location: [facility];  Service: Cardiovascular;  Laterality: Right;  SFA/POPLITEAL   PERIPHERAL VASCULAR BALLOON ANGIOPLASTY Left 10/28/2022   Procedure: PERIPHERAL VASCULAR BALLOON ANGIOPLASTY;  Surgeon: Nada Libman, MD;  Location: [facility];  Service: Cardiovascular;  Laterality: Left;  L peroneal and L pop    STUMP REVISION Right 06/20/2022   Procedure: REVISION RIGHT BELOW KNEE AMPUTATION;  Surgeon: Nadara Mustard, MD;  Location: [facility] OR;  Service: Orthopedics;  Laterality: Right;   TONSILLECTOMY     VASECTOMY     Patient Active Problem List   Diagnosis Date Noted   Elevated coronary artery calcium score 08/06/2022   Dehiscence of amputation stump of right lower extremity (HCC) 06/20/2022   Acute blood loss anemia 05/19/2022   Post-op pain 05/19/2022   Acute renal failure (HCC) 05/19/2022   S/P BKA (below knee amputation), right (HCC) 05/14/2022   Below-knee amputation of right lower extremity (HCC) 05/09/2022   Gangrene of right foot (HCC)    PAD (peripheral artery disease) (HCC) 04/17/2022   Acute osteomyelitis of metatarsal bone of right foot (HCC)    Atherosclerosis of native arteries of the extremities with ulceration (HCC) 04/15/2022   Peripheral arterial disease (HCC) 03/25/2022   Obesity 03/21/2022   Prediabetes 03/21/2022   Erectile dysfunction 07/26/2021   Family history of ischemic heart disease (IHD) 07/26/2021   Pure hypercholesterolemia 07/26/2021   History of adenomatous polyp of colon 02/03/2019   Internal hemorrhoids 02/03/2019   Carotid artery stenosis, asymptomatic 09/05/2015   Cellulitis    Hypertension    Hyperlipidemia    Occlusion and stenosis of carotid artery without mention of cerebral infarction 01/01/2012    ONSET DATE: 10/15/2022 prosthesis delivery  REFERRING DIAG: U04.540J (ICD-10-CM) - Below-knee amputation of right lower extremity   THERAPY DIAG:  Other abnormalities of gait and mobility  Unsteadiness on feet  Muscle weakness (generalized)  Non-pressure chronic ulcer of skin of other sites with other specified severity (HCC)  Rationale for Evaluation and Treatment: Rehabilitation  SUBJECTIVE:  SUBJECTIVE STATEMENT: He played golf and got into pool a couple times. The seal bag inflated when he got into pool. The limb discomfort is worse when  getting up after sitting with knee flexed. A big piece of scab came off and feels better. The prosthetist changed prosthesis and now feels tall.      PERTINENT HISTORY: carotid artery occlusion, HTN, PVD, BLE vascular sugeries  PAIN:  Are you having pain? Limb pain 0/10 with weight bearing in last week up to  3/10  PRECAUTIONS: None  WEIGHT BEARING RESTRICTIONS: No  FALLS: Has patient fallen in last 6 months? No  LIVING ENVIRONMENT: Lives with: lives with their spouse Lives in: House  single level Home Access: Stairs to enter Home layout: One level Stairs: Yes: External: 3 steps; can reach both Has following equipment at home: Single point cane, Walker - 2 wheeled, Wheelchair (manual), shower chair, and knee scooter  PLOF: Independent, Independent with household mobility without device, and Independent with community mobility without device  circulation issues started in 2019 with worsening 2021,   PATIENT GOALS:   to use prosthesis to travel, golf, climb ladder, go to gym, active in community including coaching,    OBJECTIVE:  COGNITION: Overall cognitive status: Within functional limits for tasks assessed   SENSATION: WFL  POSTURE: flexed trunk  and weight shift  left  LOWER EXTREMITY ROM:  ROM P:passive  A:active Right eval  Hip flexion   Hip extension   Hip abduction   Hip adduction   Hip internal rotation   Hip external rotation   Knee flexion WFL  Knee extension WFL  Ankle dorsiflexion   Ankle plantarflexion   Ankle inversion   Ankle eversion    (Blank rows = not tested)  LOWER EXTREMITY MMT:  MMT Right eval Left eval  Hip flexion 5/5 5/5  Hip extension 4/5   Hip abduction 4/5   Hip adduction    Hip internal rotation    Hip external rotation    Knee flexion 4/5   Knee extension 4/5   Ankle dorsiflexion    Ankle plantarflexion    Ankle inversion    Ankle eversion    (Blank rows = not tested)  TRANSFERS: 11/17/2022: sit to /from stand:  independent without UE assist  10/22/2022 / Eval: Sit to stand: Modified independence Stand to sit: Modified independence  GAIT: 01/13/2023: Gait Velocity: no device comfortable / self-selected pace 3.66 ft/sec and fast pace 5.60 ft/sec Pt amb 500' without device with cues only.  Pt neg ramp & curb without device with supervision / guarded motions. Pt neg stairs alternating pattern with single rail with cues for technique.    11/17/2022:  Pt amb community distances with cane including neg ramps & curbs safely.  Pt amb up to 200' (residual limb pain limits distances) without device safely with verbal cues only to correct deviations. Gait Velocity: self-selected 2.13 ft/sec & fast pace 2.67 ft/sec Functional Gait Assessment: 10/30  10/22/2022 / Eval: Gait pattern: step to pattern, decreased step length- Left, decreased stance time- Right, decreased hip/knee flexion- Right, Right hip hike, knee flexed in stance- Right, antalgic, trunk flexed, and abducted- Right Distance walked: 50' Assistive device utilized: Environmental consultant - 2 wheeled and TTA prosthesis Level of assistance: SBA / cues for deviations no balance issues noted with RW Comments: due to wound issues PT recommending RW for BUE support initially  FUNCTIONAL TESTs:  01/13/2023: Timed Up & Go without device except prosthesis: standard 8.94 sec and cognitive 8.94 sec Four Square Step Test 14.18 & 13.50 sec both indicating fall risk Functional Gait Assessment 16/30 without device. Indicates high fall risk.     4/8/2024Sharlene Motts Balance 50/56 & Functional Gait Assessment 10/30  Eval / 10/22/2022:  Sharlene Motts Balance Scale: 36/56  CURRENT PROSTHETIC WEAR ASSESSMENT: 01/13/2023: patient tolerates prosthesis wear >90% of awake hours. He has wound with scab at distal tibia present which is healing still.  He has limb pain 0-4/10 with prosthesis wear & weight bearing but significant improvement.  Pt is independent with: skin check, residual limb care,  prosthetic cleaning, ply sock cleaning, correct ply sock adjustment, proper wear schedule/adjustment, and proper weight-bearing schedule/adjustment   11/17/2022: Pt wearing prosthesis most of awake hours except bathing & last 1-1.5 hours of day. Wound scab at distal tibia. PT continues to advise / problem-solve limb pain & other prosthetic issues.    Eval / 10/22/2022: Patient is dependent with: skin check, residual limb care, prosthetic cleaning, ply sock cleaning, correct ply sock adjustment, proper wear schedule/adjustment, and proper weight-bearing schedule/adjustment Donning prosthesis: SBA Doffing prosthesis: Modified independence Prosthetic wear tolerance: 1-1.5 hours, 2x/day, for last 2 days.  He wore prosthesis 1hr 2x on 3/8 and got water blisters.  Stopped wear on 3/9 & 3/10, resumed 3/11 1.5 hrs 2x/day for 2 days.  Prosthetic weight bearing tolerance: 5 minutes  Edema: pitting Residual limb condition: draining (serous) wound at distal tibia, 2nd open blister on distal limb, minimal hair growth, cylindrical shape, no warmth or abnormal color changes noted.  Prosthetic description: silicon liner with pin lock suspension, total contact socket with flexible inner liner, 0-ply socks, dynamic response foot K code/activity level with prosthetic use: Level 3    TODAY'S TREATMENT:                                                                                                                             DATE:  02/03/2023: Prosthetic Training with TTA prosthesis:  -PT demo & verbal cues on use of suction seal vacuum water bag. He needs to remove all air from system by pumping until bulb does not re-inflate and may need 1-2# ankle weight on prosthesis if submerging foot.  Pt verbalized understanding.  -pt reported some distal patella pressure.  PT instructed in proper positioning of patella tendon bar and probably needed more ply socks so limb does not sit so deep into socket. Pt verbalized  understanding.  -Patient reports large fluctuations in ply socks from morning to night.  PT discussed may be having fluid retention at night with only Vivewear sock and may need to consider use of tubal compression over Vivewear to minimize changes. At this point in prosthesis use would expect up to 5-ply change at most during day. Pt verbalized understanding.  -PT assessed prosthesis height with pelvis in standing and lift plates.  His prosthesis does appear ~1/4" tall. -He reported more discomfort when initiates walking after he sits with knee bent in tight area like car but not so much if he can extend LE out.  PT educated may be due to posterior socket pressure in sitting. At next appt with prosthetist discuss design of posterior socket with middle section rolled more and increase depth of hamstring relieves. Pt verbalized understanding.  -PT educated that he needs to work on the following as PT noting some differences like timing with activities. To return to his target activities he needs to be more fluent. Pt verbalized understanding.  stepping over obstacles - lead with prosthesis to see it clear. curb leading with either LE - try to maintain momentum scanning during gait _ both lines of T & X goal is to maintain path & pace alternating 3 steps eyes open & eyes closed picking up pace  stairs alternating pattern 4 square stepping clockwise & counterclockwise  01/13/2023: Prosthetic Training with TTA prosthesis: See objective data. PT reviewed results and indications for function.  PT recommended & verbal cues on stepping over obstacles, curb leading with either LE, scanning during gait, alternating 3 steps eyes open & eyes closed, picking up pace and stairs alternating pattern. Pt verbalized & return demo understanding.  Prosthesis appears 3/8" too short. PT recommended appt with prosthetist to correct and may need temporary lift in left shoe if catching toe until he accommodates. Pt verbalized  understanding.  12/30/2022: Prosthetic Training with TTA prosthesis: PT demo & verbal cues on locating bladder sleeve so pressure will not be on tibial plateau or crest. Pt verbalized understanding.  Used mirror to work on equal weight bearing BLEs with pelvic / trunk shift with hip width stance for standing ADLs and wider for sports. Focus on weight shift onto prosthesis before initiating swing.  Simulated baseball & golf swing with proper weight shift. Tossing ball with LLE step then RLE step through.  Pt verbalized understanding. PT demo & verbal cues on focus for sound foot heel rise prior to stepping to facilitate weight shift directly over prosthesis in stance. Pt able to return demo understanding and reports improved comfort with prosthesis.    Therapeutic Exercise: Leg Press BLEs 100# 10 reps with PT cueing use at gym. Hamstring stretch seated with strap DF trunk flexion 30 sec hold 1 rep Standing gastroc with forefoot on 2" step 30 sec hold 1 rep Quad stretch supine hooklying with strap knee flexion 30 sec hold 1 rep Pt verbalized understanding of above.     HOME EXERCISE PROGRAM: Access Code: [email or web address] URL: [email or web address] Date: 12/09/2022 Prepared by: Vladimir Faster  Exercises - Wide stance on Foam Pad head movements  - 1 x daily - 4 x weekly - 1 sets - 5-10 reps  HEP 11/04/2022 with PT demo & verbal cues. Pt & wife verbalized understanding with Pt return demo.  1.  Sidestepping right and left along counter with cues on engaging bilateral lower extremities. 10' 3 laps.  2.  Counter RUE and cane LUE -walking forward with equal step length using heel toe as marker and walking backwards with weight shift over the prosthesis in stance. 10' 3 laps.   3.  Weight shift over RLE in stance with tightening hip muscles for stabilization tapping left forefoot in lower cabinet. 10 reps 2 sets.  4. Sit to / from stand 18" chair without armrest using upper extremities  lightly on seat.  PT demo and verbal cues on technique to decrease upper extremity support and increased lower extremity utilization.  10 reps 2 sets.  5.  Picking up washcloth from floor. PT demo & verbal cues on technique with TTA prosthesis and set-up for safety.  10 reps 2 sets.   ASSESSMENT: CLINICAL IMPRESSION: Patient appears to understand prosthetic care instructions noted above to address advanced issues that he is reporting. Pt appears to also understand recommendations for working on advanced skills noted above. Pt continues to benefit from skilled PT.    OBJECTIVE IMPAIRMENTS: Abnormal gait, decreased activity tolerance, decreased balance, decreased knowledge of condition, decreased knowledge of use of DME, decreased mobility, difficulty walking, decreased strength, increased edema, prosthetic dependency , and wound on residual limb .   ACTIVITY LIMITATIONS: carrying, lifting, bending, standing, squatting, stairs, transfers, locomotion level, and prosthesis use  PARTICIPATION LIMITATIONS: meal prep, cleaning, driving, community activity, occupation, yard work, and recreational activities.  PERSONAL FACTORS: Fitness, Time since onset of injury/illness/exacerbation, and 3+ comorbidities: see PMH  are also affecting patient's functional outcome.   REHAB POTENTIAL: Good  CLINICAL DECISION MAKING: Evolving/moderate complexity  EVALUATION COMPLEXITY: Moderate   GOALS: Goals reviewed with patient? Yes  SHORT TERM GOALS: Target date: visit 18  Patient able to negotiate ramp & curb without device safely.  Baseline: SEE OBJECTIVE DATA Goal status: ongoing 02/03/2023 2.  Patient tolerates prosthesis >90% awake hours /day and weight bearing activities throughout day with limb pain </=2/10. Baseline: SEE OBJECTIVE DATA Goal status: ongoing 02/03/2023  3.  Functional Gait Assessment >/=19/30 Baseline: SEE OBJECTIVE DATA Goal status: ongoing 02/03/2023  4. Patient negotiates stairs  with single rail alternating pattern modified independent.  Baseline: SEE OBJECTIVE DATA Goal status: ongoing 02/03/2023   UPDATED LONG TERM GOALS: Target date: 04/09/2023   Patient tolerates prosthesis wear >90% of awake hours without skin or limb pain issues. Baseline: SEE OBJECTIVE DATA Goal status: ongoing 02/03/2023  2.  Functional Gait Assessment >/= 22/30 to indicate lower fall risk Baseline: SEE OBJECTIVE DATA  Goal status: ongoing 02/03/2023  3.  Patient ambulates >1000' with prosthesis only independently Baseline: SEE OBJECTIVE DATA Goal status: ongoing 02/03/2023  4.  Patient negotiates ramps, curbs & stairs with single rail with prosthesis only independently. Baseline: SEE OBJECTIVE DATA Goal status: ongoing 02/03/2023  5.  Patient demonstrates & verbalizes ability to perform yard work & golf with prosthesis only safely. Baseline: SEE OBJECTIVE DATA Goal status: ongoing 02/03/2023  6.  Four Square Step Test <10 sec safely to indicate lower fall risk.  Baseline: SEE OBJECTIVE DATA Goal status:  ongoing 02/03/2023  PLAN:  PT FREQUENCY: 1x every 1-2 weeks  PT DURATION: 5 additional visits  PLANNED INTERVENTIONS: Therapeutic exercises, Therapeutic activity, Neuromuscular re-education, Balance training, Gait training, Patient/Family education, Self Care, Stair training, Prosthetic training, Re-evaluation, and physical performance testing.  PLAN FOR NEXT SESSION:  check on prosthetic care & gait activities noted 6/25,   progress gait & balance activities in resistance, work towards updated STGs.    Vladimir Faster, PT, DPT 02/03/2023, 9:56 AM

## 2023-02-17 ENCOUNTER — Other Ambulatory Visit: Payer: Self-pay

## 2023-02-17 ENCOUNTER — Ambulatory Visit (INDEPENDENT_AMBULATORY_CARE_PROVIDER_SITE_OTHER): Payer: 59 | Admitting: Physical Therapy

## 2023-02-17 ENCOUNTER — Encounter: Payer: Self-pay | Admitting: Physical Therapy

## 2023-02-17 DIAGNOSIS — R2689 Other abnormalities of gait and mobility: Secondary | ICD-10-CM | POA: Diagnosis not present

## 2023-02-17 DIAGNOSIS — R2681 Unsteadiness on feet: Secondary | ICD-10-CM

## 2023-02-17 DIAGNOSIS — M6281 Muscle weakness (generalized): Secondary | ICD-10-CM | POA: Diagnosis not present

## 2023-02-17 DIAGNOSIS — L98498 Non-pressure chronic ulcer of skin of other sites with other specified severity: Secondary | ICD-10-CM | POA: Diagnosis not present

## 2023-02-17 MED ORDER — CLOPIDOGREL BISULFATE 75 MG PO TABS: 75.0000 mg | ORAL_TABLET | Freq: Every day | ORAL | 11 refills | Status: DC

## 2023-02-17 NOTE — Telephone Encounter (Signed)
Pt's wife called for Plavix refill.  Reviewed pt's chart, returned call for clarification, two identifiers used. Pt had transferred to a different pharmacy and prescription had expired. New script sent in. Pt has appt on 7/15 with [name].

## 2023-02-17 NOTE — Therapy (Signed)
OUTPATIENT PHYSICAL THERAPY TREATMENT   Patient Name: Clinton [name] MRN: [id number] DOB:1955-12-03, 67 y.o., male Today's Date: 02/17/2023  PCP: Darrow Bussing, MD REFERRING PROVIDER: Delle Reining, PA-C    END OF SESSION:  PT End of Session - 02/17/23 0759     Visit Number 17    Number of Visits 20    Date for PT Re-Evaluation 04/09/23    Authorization Type [employer]    Authorization Time Period 20 PT visits, 10% co-insurance    Authorization - Visit Number 17    Authorization - Number of Visits 20    Progress Note Due on Visit 20    PT Start Time 0759    PT Stop Time 0843    PT Time Calculation (min) 44 min    Activity Tolerance Patient tolerated treatment well    Behavior During Therapy [facility] for tasks assessed/performed                       Past Medical History:  Diagnosis Date   Carotid artery occlusion    Cellulitis    Erectile dysfunction    Hyperlipidemia    Hypertension    Peripheral vascular disease (HCC)    Past Surgical History:  Procedure Laterality Date   ABDOMINAL AORTOGRAM W/LOWER EXTREMITY N/A 02/04/2022   Procedure: ABDOMINAL AORTOGRAM W/LOWER EXTREMITY;  Surgeon: Nada Libman, MD;  Location: [facility];  Service: Cardiovascular;  Laterality: N/A;   ABDOMINAL AORTOGRAM W/LOWER EXTREMITY N/A 02/18/2022   Procedure: ABDOMINAL AORTOGRAM W/LOWER EXTREMITY;  Surgeon: Nada Libman, MD;  Location: [facility];  Service: Cardiovascular;  Laterality: N/A;   ABDOMINAL AORTOGRAM W/LOWER EXTREMITY N/A 04/15/2022   Procedure: ABDOMINAL AORTOGRAM W/LOWER EXTREMITY;  Surgeon: Nada Libman, MD;  Location: [facility];  Service: Cardiovascular;  Laterality: N/A;   ABDOMINAL AORTOGRAM W/LOWER EXTREMITY N/A 10/28/2022   Procedure: ABDOMINAL AORTOGRAM W/LOWER EXTREMITY;  Surgeon: Nada Libman, MD;  Location: [facility];  Service: Cardiovascular;  Laterality: N/A;   AMPUTATION Right 04/16/2022   Procedure: RIGHT 5TH RAY  AMPUTATION;  Surgeon: Nadara Mustard, MD;  Location: [facility] OR;  Service: Orthopedics;  Laterality: Right;   AMPUTATION Right 05/09/2022   Procedure: RIGHT BELOW KNEE AMPUTATION;  Surgeon: Nadara Mustard, MD;  Location: [facility] OR;  Service: Orthopedics;  Laterality: Right;   APPLICATION OF WOUND VAC Right 06/20/2022   Procedure: APPLICATION OF WOUND VAC;  Surgeon: Nadara Mustard, MD;  Location: [facility] OR;  Service: Orthopedics;  Laterality: Right;   COLONOSCOPY     ENDARTERECTOMY Right 09/05/2015   Procedure: ENDARTERECTOMY CAROTID RIGHT;  Surgeon: Sherren Kerns, MD;  Location: [facility] OR;  Service: Vascular;  Laterality: Right;   PATCH ANGIOPLASTY  09/05/2015   Procedure: PATCH ANGIOPLASTY RIGHT CAROTID ARTERY USING HEMASHIELD PLATINUM FINESSE PATCH;  Surgeon: Sherren Kerns, MD;  Location: [facility] OR;  Service: Vascular;;   PERIPHERAL INTRAVASCULAR LITHOTRIPSY Left 10/28/2022   Procedure: INTRAVASCULAR LITHOTRIPSY;  Surgeon: Nada Libman, MD;  Location: [facility];  Service: Cardiovascular;  Laterality: Left;  L peroneal   PERIPHERAL VASCULAR ATHERECTOMY Left 02/18/2022   Procedure: PERIPHERAL VASCULAR ATHERECTOMY;  Surgeon: Nada Libman, MD;  Location: [facility];  Service: Cardiovascular;  Laterality: Left;  Popliteal and Peroneal   PERIPHERAL VASCULAR BALLOON ANGIOPLASTY  02/04/2022   Procedure: PERIPHERAL VASCULAR BALLOON ANGIOPLASTY;  Surgeon: Nada Libman, MD;  Location: [facility];  Service:  Cardiovascular;;   PERIPHERAL VASCULAR BALLOON ANGIOPLASTY Right 04/15/2022   Procedure: PERIPHERAL VASCULAR BALLOON ANGIOPLASTY;  Surgeon: Nada Libman, MD;  [facility]: [facility];  Service: Cardiovascular;  Laterality: Right;  SFA/POPLITEAL   PERIPHERAL VASCULAR BALLOON ANGIOPLASTY Left 10/28/2022   Procedure: PERIPHERAL VASCULAR BALLOON ANGIOPLASTY;  Surgeon: Nada Libman, MD;  [facility]: [facility];  Service: Cardiovascular;  Laterality: Left;  L peroneal and L pop    STUMP REVISION Right 06/20/2022   Procedure: REVISION RIGHT BELOW KNEE AMPUTATION;  Surgeon: Nadara Mustard, MD;  Location: [facility] OR;  Service: Orthopedics;  Laterality: Right;   TONSILLECTOMY     VASECTOMY     Patient Active Problem List   Diagnosis Date Noted   Elevated coronary artery calcium score 08/06/2022   Dehiscence of amputation stump of right lower extremity (HCC) 06/20/2022   Acute blood loss anemia 05/19/2022   Post-op pain 05/19/2022   Acute renal failure (HCC) 05/19/2022   S/P BKA (below knee amputation), right (HCC) 05/14/2022   Below-knee amputation of right lower extremity (HCC) 05/09/2022   Gangrene of right foot (HCC)    PAD (peripheral artery disease) (HCC) 04/17/2022   Acute osteomyelitis of metatarsal bone of right foot (HCC)    Atherosclerosis of native arteries of the extremities with ulceration (HCC) 04/15/2022   Peripheral arterial disease (HCC) 03/25/2022   Obesity 03/21/2022   Prediabetes 03/21/2022   Erectile dysfunction 07/26/2021   Family history of ischemic heart disease (IHD) 07/26/2021   Pure hypercholesterolemia 07/26/2021   History of adenomatous polyp of colon 02/03/2019   Internal hemorrhoids 02/03/2019   Carotid artery stenosis, asymptomatic 09/05/2015   Cellulitis    Hypertension    Hyperlipidemia    Occlusion and stenosis of carotid artery without mention of cerebral infarction 01/01/2012    ONSET DATE: 10/15/2022 prosthesis delivery  REFERRING DIAG: Z61.096E (ICD-10-CM) - Below-knee amputation of right lower extremity   THERAPY DIAG:  Other abnormalities of gait and mobility  Unsteadiness on feet  Muscle weakness (generalized)  Non-pressure chronic ulcer of skin of other sites with other specified severity (HCC)  Rationale for Evaluation and Treatment: Rehabilitation  SUBJECTIVE:  SUBJECTIVE STATEMENT:   wound has closed.  Things are good but balance.  No falls or close falls just seem uneasy.  He saw Scarlette Slice, [facility] last week  who made adjustments in alignment but did not change height. He has stopped his toe a couple of times. Sitting with knee bent then is uncomfortable getting up.    PERTINENT HISTORY: carotid artery occlusion, HTN, PVD, BLE vascular sugeries  PAIN:  Are you having pain? Limb pain 0/10 with weight bearing in last week up to  3/10  PRECAUTIONS: None  WEIGHT BEARING RESTRICTIONS: No  FALLS: Has patient fallen in last 6 months? No  LIVING ENVIRONMENT: Lives with: lives with their spouse Lives in: House  single level Home Access: Stairs to enter Home layout: One level Stairs: Yes: External: 3 steps; can reach both Has following equipment at home: Single point cane, Walker - 2 wheeled, Wheelchair (manual), shower chair, and knee scooter  PLOF: Independent, Independent with household mobility without device, and Independent with community mobility without device  circulation issues started in 2019 with worsening 2021,   PATIENT GOALS:   to use prosthesis to travel, golf, climb ladder, go to gym, active in community including coaching,    OBJECTIVE:  COGNITION: Overall cognitive status: Within functional limits for tasks assessed   SENSATION: [facility]  POSTURE: flexed trunk  and weight shift left  LOWER EXTREMITY ROM:  ROM P:passive  A:active Right eval  Hip flexion   Hip extension   Hip abduction   Hip adduction   Hip internal rotation   Hip external rotation   Knee flexion WFL  Knee extension WFL  Ankle dorsiflexion   Ankle plantarflexion   Ankle inversion   Ankle eversion    (Blank rows = not tested)  LOWER EXTREMITY MMT:  MMT Right eval Left eval  Hip flexion 5/5 5/5  Hip extension 4/5   Hip abduction 4/5   Hip adduction    Hip internal rotation    Hip external rotation    Knee flexion 4/5   Knee extension 4/5   Ankle dorsiflexion    Ankle plantarflexion    Ankle inversion    Ankle eversion    (Blank rows = not tested)  TRANSFERS: 11/17/2022: sit to /from  stand: independent without UE assist  10/22/2022 / Eval: Sit to stand: Modified independence Stand to sit: Modified independence  GAIT: 01/13/2023: Gait Velocity: no device comfortable / self-selected pace 3.66 ft/sec and fast pace 5.60 ft/sec Pt amb 500' without device with cues only.  Pt neg ramp & curb without device with supervision / guarded motions. Pt neg stairs alternating pattern with single rail with cues for technique.    11/17/2022:  Pt amb community distances with cane including neg ramps & curbs safely.  Pt amb up to 200' (residual limb pain limits distances) without device safely with verbal cues only to correct deviations. Gait Velocity: self-selected 2.13 ft/sec & fast pace 2.67 ft/sec Functional Gait Assessment: 10/30  10/22/2022 / Eval: Gait pattern: step to pattern, decreased step length- Left, decreased stance time- Right, decreased hip/knee flexion- Right, Right hip hike, knee flexed in stance- Right, antalgic, trunk flexed, and abducted- Right Distance walked: 50' Assistive device utilized: Environmental consultant - 2 wheeled and TTA prosthesis Level of assistance: SBA / cues for deviations no balance issues noted with RW Comments: due to wound issues PT recommending RW for BUE support initially  FUNCTIONAL TESTs:  01/13/2023: Timed Up & Go without device except prosthesis: standard 8.94 sec and cognitive 8.94 sec Four Square Step Test 14.18 & 13.50 sec both indicating fall risk Functional Gait Assessment 16/30 without device. Indicates high fall risk.     4/8/2024Sharlene Motts Balance 50/56 & Functional Gait Assessment 10/30  Eval / 10/22/2022:  Sharlene Motts Balance Scale: 36/56  CURRENT PROSTHETIC WEAR ASSESSMENT: 01/13/2023: patient tolerates prosthesis wear >90% of awake hours. He has wound with scab at distal tibia present which is healing still.  He has limb pain 0-4/10 with prosthesis wear & weight bearing but significant improvement.  Pt is independent with: skin check, residual limb  care, prosthetic cleaning, ply sock cleaning, correct ply sock adjustment, proper wear schedule/adjustment, and proper weight-bearing schedule/adjustment   11/17/2022: Pt wearing prosthesis most of awake hours except bathing & last 1-1.5 hours of day. Wound scab at distal tibia. PT continues to advise / problem-solve limb pain & other prosthetic issues.    Eval / 10/22/2022: Patient is dependent with: skin check, residual limb care, prosthetic cleaning, ply sock cleaning, correct ply sock adjustment, proper wear schedule/adjustment, and proper weight-bearing schedule/adjustment Donning prosthesis: SBA Doffing prosthesis: Modified independence Prosthetic wear tolerance: 1-1.5 hours, 2x/day, for last 2 days.  He wore prosthesis 1hr 2x on 3/8 and got water blisters.  Stopped wear on 3/9 & 3/10, resumed 3/11 1.5 hrs 2x/day for 2 days.  Prosthetic weight bearing tolerance: 5 minutes Edema: pitting Residual limb condition: draining (serous) wound at distal tibia, 2nd open blister on distal limb, minimal hair growth, cylindrical shape, no warmth or abnormal color changes noted.  Prosthetic description: silicon liner with pin lock suspension, total contact socket with flexible inner liner, 0-ply socks, dynamic response foot K code/activity level with prosthetic use: Level 3    TODAY'S TREATMENT:                                                                                                                             DATE:  02/17/2023: Prosthetic Training with TTA prosthesis: PT educated in Positioning seated so weight of prosthesis is taken by floor. Pt verbalized understanding.  PT assessed pelvis height in standing equal weight bearing and prosthesis appears proper height.  PT recommended using Adjust-a-Lift in left shoe and progressively decreasing until removes. This will allow perception that prosthesis is too high until he can accommodate to height. Pt verbalized understanding. Continue to do HEP  for ambulation to improve dynamic balance. Pt verbalized understanding.  Neuromuscular Re-education: Standing with equal weight bearing LEs with UE resistance blue T-band alternating UEs & BUEs 10 reps each for row, forward reach & biceps curl. Added to HEP with verbal, demo & HO cues. Pt verbalized & return demo understanding.  Standing T-band green hip abd, ext, add & SLR flex 10 reps ea BLEs with light chair back support. Added to HEP with verbal, demo & HO cues. Pt verbalized & return demo understanding.    02/03/2023: Prosthetic Training with TTA prosthesis:  -PT demo & verbal cues on use of suction seal vacuum water bag. He needs to remove all air from system by pumping until bulb does not re-inflate and may need 1-2# ankle weight on prosthesis if submerging foot.  Pt verbalized understanding.  -pt reported some distal patella pressure.  PT instructed in proper positioning of patella tendon bar and probably needed more ply socks so limb does not sit so deep into socket. Pt verbalized understanding.  -Patient reports large fluctuations in ply socks from morning to night.  PT discussed may be having fluid retention at night with only Vivewear sock and may need to consider use of tubal compression over Vivewear to minimize changes. At this point in prosthesis use would expect up to 5-ply change at most during day. Pt verbalized understanding.  -PT assessed prosthesis height with pelvis in standing and lift plates.  His prosthesis does appear ~1/4" tall. -He reported more discomfort when initiates walking after he sits with knee bent in tight area like car but not so much if he can extend LE out.  PT educated may be due to posterior socket pressure in sitting. At next appt with prosthetist discuss design of posterior socket with middle section rolled more and increase depth of hamstring relieves. Pt verbalized understanding.  -PT educated that he needs to work on the following as PT noting some  differences like timing with activities. To return to his target activities he needs to be more fluent. Pt verbalized understanding.  stepping over obstacles - lead with prosthesis to see it clear. curb leading with either LE - try to maintain momentum scanning during gait _ both lines of T & X goal is to maintain path & pace alternating 3 steps eyes open & eyes closed picking up pace  stairs alternating pattern 4 square stepping clockwise & counterclockwise  01/13/2023: Prosthetic Training with TTA prosthesis: See objective data. PT reviewed results and indications for function.  PT recommended & verbal cues on stepping over obstacles, curb leading with either LE, scanning during gait, alternating 3 steps eyes open & eyes closed, picking up pace and stairs alternating pattern. Pt verbalized & return demo understanding.  Prosthesis appears 3/8" too short. PT recommended appt with prosthetist to correct and may need temporary lift in left shoe if catching toe until he accommodates. Pt verbalized understanding.      HOME EXERCISE PROGRAM: Access Code: [email or web address] URL: [email or web address] Date: 02/17/2023 Prepared by: Vladimir Faster  Exercises - Wide stance on Foam Pad head movements  - 1 x daily - 4 x weekly - 1 sets - 5-10 reps - Alternating Punch with Resistance  - 1 x daily - 3-4 x weekly - 1 sets - 10 reps - 5 seconds hold - Standing Scapular Protraction with Resistance  - 1 x daily - 3-4 x weekly - 1 sets - 10 reps - 5 seconds hold - Standing alternate rows with resistance  - 1 x daily - 3-4 x weekly - 1 sets - 10 reps - 5 seconds hold - Standing Row with Anchored Resistance  - 1 x daily - 3-4 x weekly - 1 sets - 10 reps - 5 seconds hold - Alternating elbow flexion with resistance  - 1 x daily - 3-4 x weekly - 1 sets - 10 reps - 5 seconds hold - Standing Bicep Curls with Resistance  - 1 x daily - 3-4 x weekly - 1 sets - 10 reps - 5 seconds hold - Standing Hip Flexion with  Resistance (Mirrored)  - 1 x daily - 3-4 x weekly - 1 sets - 10 reps - Standing Hip Adduction with Resistance (Mirrored)  - 1 x daily - 3-4 x weekly - 1 sets - 10 reps - Standing Hip Extension with Resistance  - 1 x daily - 3-4 x weekly - 1 sets - 10 reps - Standing Hip Abduction with Theraband Resistance  - 1 x daily - 3-4 x weekly - 1 sets - 10 reps  HEP 11/04/2022 with PT demo & verbal cues. Pt & wife verbalized understanding with Pt return demo.  1.  Sidestepping right and left along counter with cues on engaging bilateral lower extremities. 10' 3 laps.  2.  Counter RUE and cane LUE -walking forward with equal step length using heel toe as marker and walking backwards with weight shift over the prosthesis in stance. 10' 3 laps.   3.  Weight shift over RLE in stance with tightening hip muscles for stabilization tapping left forefoot in lower cabinet. 10 reps 2 sets.  4. Sit to / from stand 18" chair without armrest using upper extremities lightly on seat.  PT demo and verbal cues on technique to decrease upper extremity support and increased lower extremity utilization.  10 reps 2 sets.  5.  Picking up washcloth from floor. PT demo & verbal cues on technique with TTA  prosthesis and set-up for safety.  10 reps 2 sets.   ASSESSMENT: CLINICAL IMPRESSION: Patient appears to understand standing T-band exercises for UEs & LEs.  He appears to understand prosthetic instructions also. Pt continues to benefit from skilled PT.    OBJECTIVE IMPAIRMENTS: Abnormal gait, decreased activity tolerance, decreased balance, decreased knowledge of condition, decreased knowledge of use of DME, decreased mobility, difficulty walking, decreased strength, increased edema, prosthetic dependency , and wound on residual limb .   ACTIVITY LIMITATIONS: carrying, lifting, bending, standing, squatting, stairs, transfers, locomotion level, and prosthesis use  PARTICIPATION LIMITATIONS: meal prep, cleaning, driving, community  activity, occupation, yard work, and recreational activities.  PERSONAL FACTORS: Fitness, Time since onset of injury/illness/exacerbation, and 3+ comorbidities: see PMH  are also affecting patient's functional outcome.   REHAB POTENTIAL: Good  CLINICAL DECISION MAKING: Evolving/moderate complexity  EVALUATION COMPLEXITY: Moderate   GOALS: Goals reviewed with patient? Yes  SHORT TERM GOALS: Target date: visit 18  Patient able to negotiate ramp & curb without device safely.  Baseline: SEE OBJECTIVE DATA Goal status: ongoing 02/17/2023 2.  Patient tolerates prosthesis >90% awake hours /day and weight bearing activities throughout day with limb pain </=2/10. Baseline: SEE OBJECTIVE DATA Goal status: ongoing 02/17/2023  3.  Functional Gait Assessment >/=19/30 Baseline: SEE OBJECTIVE DATA Goal status: ongoing 02/17/2023  4. Patient negotiates stairs with single rail alternating pattern modified independent.  Baseline: SEE OBJECTIVE DATA Goal status: ongoing 02/17/2023   UPDATED LONG TERM GOALS: Target date: 04/09/2023   Patient tolerates prosthesis wear >90% of awake hours without skin or limb pain issues. Baseline: SEE OBJECTIVE DATA Goal status: ongoing 02/17/2023  2.  Functional Gait Assessment >/= 22/30 to indicate lower fall risk Baseline: SEE OBJECTIVE DATA  Goal status: ongoing 02/17/2023  3.  Patient ambulates >1000' with prosthesis only independently Baseline: SEE OBJECTIVE DATA Goal status: ongoing 02/17/2023  4.  Patient negotiates ramps, curbs & stairs with single rail with prosthesis only independently. Baseline: SEE OBJECTIVE DATA Goal status: ongoing 02/17/2023  5.  Patient demonstrates & verbalizes ability to perform yard work & golf with prosthesis only safely. Baseline: SEE OBJECTIVE DATA Goal status: ongoing 02/17/2023  6.  Four Square Step Test <10 sec safely to indicate lower fall risk.  Baseline: SEE OBJECTIVE DATA Goal status:  ongoing 02/17/2023  PLAN:  PT  FREQUENCY: 1x every 1-2 weeks  PT DURATION: 5 additional visits  PLANNED INTERVENTIONS: Therapeutic exercises, Therapeutic activity, Neuromuscular re-education, Balance training, Gait training, Patient/Family education, Self Care, Stair training, Prosthetic training, Re-evaluation, and physical performance testing.  PLAN FOR NEXT SESSION: check updated STGs   check on T-band exercises,   progress gait & balance activities in resistance.    Vladimir Faster, PT, DPT 02/17/2023, 10:14 AM

## 2023-02-19 ENCOUNTER — Ambulatory Visit (INDEPENDENT_AMBULATORY_CARE_PROVIDER_SITE_OTHER): Payer: 59 | Admitting: Orthopedic Surgery

## 2023-02-19 DIAGNOSIS — S88111A Complete traumatic amputation at level between knee and ankle, right lower leg, initial encounter: Secondary | ICD-10-CM

## 2023-02-19 DIAGNOSIS — Z89511 Acquired absence of right leg below knee: Secondary | ICD-10-CM

## 2023-02-23 ENCOUNTER — Ambulatory Visit: Payer: 59 | Admitting: Surgery

## 2023-02-23 ENCOUNTER — Ambulatory Visit (INDEPENDENT_AMBULATORY_CARE_PROVIDER_SITE_OTHER)
Admission: RE | Admit: 2023-02-23 | Discharge: 2023-02-23 | Disposition: A | Discharge status: Home / Self Care | Payer: 59 | Source: Ambulatory Visit | Attending: Surgery | Admitting: Surgery

## 2023-02-23 ENCOUNTER — Ambulatory Visit (HOSPITAL_COMMUNITY)
Admission: RE | Admit: 2023-02-23 | Discharge: 2023-02-23 | Disposition: A | Discharge status: Home / Self Care | Payer: 59 | Source: Ambulatory Visit | Attending: Surgery | Admitting: Surgery

## 2023-02-23 ENCOUNTER — Encounter: Payer: Self-pay | Admitting: Surgery

## 2023-02-23 VITALS — BP 161/79 | HR 63 | Temp 97.7°F | Resp 20 | Ht 70.0 in | Wt 204.3 lb

## 2023-02-23 DIAGNOSIS — I6523 Occlusion and stenosis of bilateral carotid arteries: Secondary | ICD-10-CM | POA: Insufficient documentation

## 2023-02-23 DIAGNOSIS — I739 Peripheral vascular disease, unspecified: Secondary | ICD-10-CM | POA: Insufficient documentation

## 2023-02-23 DIAGNOSIS — I70213 Atherosclerosis of native arteries of extremities with intermittent claudication, bilateral legs: Secondary | ICD-10-CM

## 2023-02-23 DIAGNOSIS — I7025 Atherosclerosis of native arteries of other extremities with ulceration: Secondary | ICD-10-CM | POA: Diagnosis not present

## 2023-02-23 LAB — VAS US ABI WITH/WO TBI: Left ABI: 0.67

## 2023-02-23 NOTE — Progress Notes (Signed)
Vascular and Vein Specialist of [facility]  Patient name: Clinton [name] MRN: [id number] DOB: 06/16/1956 Sex: male   REASON FOR VISIT:    Follow up  HISOTRY OF PRESENT ILLNESS:   Clinton [name] is a 67 y.o. male who has undergone the following procedures:     09/05/2015: Right carotid endarterectomy (asymptomatic, [name]) 02/04/2022: DCB, right popliteal artery, PTA right anterior tibial artery (ulcers, [name]) 02/18/2022: DCB, left popliteal artery, PTA, left peroneal artery, atherectomy, left popliteal and peroneal artery (ulcers, [name]) 04/15/2022: Angioplasty, right popliteal artery, angioplasty, right anterior tibial artery (ulcer, [name]) 04/16/2022: Right fifth toe amputation [name]) 05/09/2022: Right below-knee amputation [name]) 06/20/2022: Revision right below-knee amputation [name]) 10/28/2022: Left below-knee popliteal artery shockwave lithotripsy and drug-coated balloon angioplasty.  Posterior tibial angioplasty (ulcer)   He is now walking with a prosthesis.  There are no wounds on his left foot.  He is on dual antiplatelet therapy and a statin.  PAST MEDICAL HISTORY:   Past Medical History:  Diagnosis Date   Carotid artery occlusion    Cellulitis    Erectile dysfunction    Hyperlipidemia    Hypertension    Peripheral vascular disease (HCC)      FAMILY HISTORY:   Family History  Problem Relation Age of Onset   Other Mother        circulatory problems   Heart attack Mother    Deep vein thrombosis Mother    Heart disease Mother    Hyperlipidemia Father    Hypertension Father    Diabetes Father    Deep vein thrombosis Father    Heart disease Father        before age 13   Heart attack Father    Peripheral vascular disease Father    Hypertension Brother    Hyperlipidemia Brother     SOCIAL HISTORY:   Social History   Tobacco Use   Smoking status: Never   Smokeless tobacco: Former    Types: Chew     Quit date: 01/01/2015  Substance Use Topics   Alcohol use: Not Currently     ALLERGIES:   Allergies  Allergen Reactions   Penicillins Rash    Has patient had a PCN reaction causing immediate rash, facial/tongue/throat swelling, SOB or lightheadedness with hypotension: Yes Has patient had a PCN reaction causing severe rash involving mucus membranes or skin necrosis: No Has patient had a PCN reaction that required hospitalization No Has patient had a PCN reaction occurring within the last 10 years: No If all of the above answers are "NO", then may proceed with Cephalosporin use.  Tolerated Cefazolin on 04/16/22    Sulfa Antibiotics Swelling and Other (See Comments)    Facial/lip swelling     CURRENT MEDICATIONS:   Current Outpatient Medications  Medication Sig Dispense Refill   amLODipine (NORVASC) 2.5 MG tablet Take 2.5 mg by mouth at bedtime.     ascorbic acid (VITAMIN C) 500 MG tablet Take 500 mg by mouth daily.     aspirin EC 81 MG tablet Take 81 mg by mouth at bedtime.     atorvastatin (LIPITOR) 80 MG tablet Take 80 mg by mouth at bedtime.     clopidogrel (PLAVIX) 75 MG tablet Take 1 tablet (75 mg total) by mouth daily. 30 tablet 11   irbesartan-hydrochlorothiazide (AVALIDE) 300-12.5 MG tablet Take 1 tablet by mouth at bedtime.     Multiple Vitamin (MULTIVITAMIN) capsule Take 2 capsules by mouth at bedtime.  Omega-3 Fatty Acids (OMEGA 3 PO) Take 3 capsules by mouth daily. 454 mg each     Probiotic Product (PROBIOTIC PO) Take 1 capsule by mouth daily.     zinc sulfate 220 (50 Zn) MG capsule Take 1 capsule (220 mg total) by mouth daily.     No current facility-administered medications for this visit.    REVIEW OF SYSTEMS:   [X]  denotes positive finding, [ ]  denotes negative finding Cardiac  Comments:  Chest pain or chest pressure:    Shortness of breath upon exertion:    Short of breath when lying flat:    Irregular heart rhythm:        Vascular    Pain in calf,  thigh, or hip brought on by ambulation:    Pain in feet at night that wakes you up from your sleep:     Blood clot in your veins:    Leg swelling:         Pulmonary    Oxygen at home:    Productive cough:     Wheezing:         Neurologic    Sudden weakness in arms or legs:     Sudden numbness in arms or legs:     Sudden onset of difficulty speaking or slurred speech:    Temporary loss of vision in one eye:     Problems with dizziness:         Gastrointestinal    Blood in stool:     Vomited blood:         Genitourinary    Burning when urinating:     Blood in urine:        Psychiatric    Major depression:         Hematologic    Bleeding problems:    Problems with blood clotting too easily:        Skin    Rashes or ulcers:        Constitutional    Fever or chills:      PHYSICAL EXAM:   Vitals:   02/23/23 1410 02/23/23 1412  BP: (!) 157/73 (!) 161/79  Pulse: 63   Resp: 20   Temp: 97.7 F (36.5 C)   SpO2: 97%   Weight: 204 lb 4.8 oz (92.7 kg)   Height: 5\' 10"  (1.778 m)     GENERAL: The patient is a well-nourished male, in no acute distress. The vital signs are documented above. CARDIAC: There is a regular rate and rhythm.  VASCULAR: Nonpalpable pedal pulse on the left PULMONARY: Non-labored respirations ABDOMEN: Soft and non-tender with normal pitched bowel sounds.  MUSCULOSKELETAL: There are no major deformities or cyanosis. NEUROLOGIC: No focal weakness or paresthesias are detected. SKIN: There are no ulcers or rashes noted. PSYCHIATRIC: The patient has a normal affect.  STUDIES:   Studies:   Carotid: Right Carotid: Velocities in the right ICA are consistent with a 1-39%  stenosis.   Left Carotid: Velocities in the left ICA are consistent with a 1-39%  stenosis.   Vertebrals: Bilateral vertebral arteries demonstrate antegrade flow.  Subclavians: Right subclavian artery was stenotic. Normal flow  hemodynamics were               seen in the left  subclavian artery.   +-------+-----------+-----------+------------+------------+  ABI/TBIToday's ABIToday's TBIPrevious ABIPrevious TBI  +-------+-----------+-----------+------------+------------+  Right BKA                   BKA                       +-------+-----------+-----------+------------+------------+  Left  0.67       0.13       0.72        0.11          +-------+-----------+-----------+------------+------------+  Left toe pressure: 20  Left: Patent left lower extremity arterial system with no stenosis  identifeid. Calcific plaque with acoustic shadowing throughout.      MEDICAL ISSUES:   Left leg PAD: Ulcers have all healed.  Imaging studies today showed no significant stenosis.  He will return in 3 months for surveillance imaging  Carotid: Ultrasound today shows widely patent bilateral carotid arteries.  He will need surveillance imaging in 1 year.    Charlena Cross, MD, FACS [facility]ists [facility] [phone] Pager [phone]

## 2023-02-27 ENCOUNTER — Encounter: Payer: Self-pay | Admitting: Orthopedic Surgery

## 2023-02-27 NOTE — Progress Notes (Signed)
Office Visit Note   Patient: Clinton [name]           Date of Birth: 1956-06-13           MRN: [id number] Visit Date: 02/19/2023              Requested by: Darrow Bussing, MD [address] [place],  [place] [address] PCP: Darrow Bussing, MD  Chief Complaint  Patient presents with   Right Leg - Follow-up    Hx Right BKA 2023      HPI: 8 months s/p BKA right, using 15 ply sock  Assessment & Plan: Visit Diagnoses:  1. Below-knee amputation of right lower extremity, initial encounter [facility])     Plan: plan for new socket in september  Follow-Up Instructions: No follow-ups on file.   Ortho Exam  Patient is alert, oriented, no adenopathy, well-dressed, normal affect, normal respiratory effort. Distal ulcer healed, no knee effusion  Patient is an existing right transtibial  amputee.  Patient's current comorbidities are not expected to impact the ability to function with the prescribed prosthesis. Patient verbally communicates a strong desire to use a prosthesis. Patient currently requires mobility aids to ambulate without a prosthesis.  Expects not to use mobility aids with a new prosthesis.  Patient is a K2 level ambulator that will use a prosthesis to walk around their home and the community over low level environmental barriers.      Imaging: No results found. No images are attached to the encounter.  Labs: Lab Results  Component Value Date   HGBA1C 5.4 04/15/2022   ESRSEDRATE 14 04/08/2022   CRP 2.1 04/08/2022   REPTSTATUS 06/25/2022 FINAL 06/20/2022   GRAMSTAIN  06/20/2022    ABUNDANT WBC PRESENT,BOTH PMN AND MONONUCLEAR NO ORGANISMS SEEN    CULT  06/20/2022    RARE HAEMOPHILUS PARAINFLUENZAE BETA LACTAMASE NEGATIVE FEW GEMELLA MORBILLORUM Standardized susceptibility testing for this organism is not available. Performed at [facility], [address], [place], [place] [address]      Lab Results  Component Value Date    ALBUMIN 3.6 06/20/2022   ALBUMIN 2.3 (L) 05/15/2022   ALBUMIN 3.9 08/27/2015    No results found for: "MG" No results found for: "VD25OH"  No results found for: "PREALBUMIN"    Latest Ref Rng & Units 10/28/2022   10:05 AM 06/20/2022   11:45 AM 05/15/2022    5:14 AM  CBC EXTENDED  WBC 4.0 - 10.5 K/uL  6.3  5.6   RBC 4.22 - 5.81 MIL/uL  3.85  2.55   Hemoglobin 13.0 - 17.0 g/dL 25.9  56.3  8.1   HCT 87.5 - 52.0 % 38.0  36.3  24.2   Platelets 150 - 400 K/uL  231  218   NEUT# 1.7 - 7.7 K/uL  4.2  3.6   Lymph# 0.7 - 4.0 K/uL  1.5  1.3      There is no height or weight on file to calculate BMI.  Orders:  No orders of the defined types were placed in this encounter.  No orders of the defined types were placed in this encounter.    Procedures: No procedures performed  Clinical Data: No additional findings.  ROS:  All other systems negative, except as noted in the HPI. Review of Systems  Objective: Vital Signs: There were no vitals taken for this visit.  Specialty Comments:  No specialty comments available.  PMFS History: Patient Active Problem List  Diagnosis Date Noted   Elevated coronary artery calcium score 08/06/2022   Dehiscence of amputation stump of right lower extremity (HCC) 06/20/2022   Acute blood loss anemia 05/19/2022   Post-op pain 05/19/2022   Acute renal failure (HCC) 05/19/2022   S/P BKA (below knee amputation), right (HCC) 05/14/2022   Below-knee amputation of right lower extremity (HCC) 05/09/2022   Gangrene of right foot (HCC)    PAD (peripheral artery disease) (HCC) 04/17/2022   Acute osteomyelitis of metatarsal bone of right foot (HCC)    Atherosclerosis of native arteries of the extremities with ulceration (HCC) 04/15/2022   Peripheral arterial disease (HCC) 03/25/2022   Obesity 03/21/2022   Prediabetes 03/21/2022   Erectile dysfunction 07/26/2021   Family history of ischemic heart disease (IHD) 07/26/2021   Pure hypercholesterolemia  07/26/2021   History of adenomatous polyp of colon 02/03/2019   Internal hemorrhoids 02/03/2019   Carotid artery stenosis, asymptomatic 09/05/2015   Cellulitis    Hypertension    Hyperlipidemia    Occlusion and stenosis of carotid artery without mention of cerebral infarction 01/01/2012   Past Medical History:  Diagnosis Date   Carotid artery occlusion    Cellulitis    Erectile dysfunction    Hyperlipidemia    Hypertension    Peripheral vascular disease (HCC)     Family History  Problem Relation Age of Onset   Other Mother        circulatory problems   Heart attack Mother    Deep vein thrombosis Mother    Heart disease Mother    Hyperlipidemia Father    Hypertension Father    Diabetes Father    Deep vein thrombosis Father    Heart disease Father        before age 35   Heart attack Father    Peripheral vascular disease Father    Hypertension Brother    Hyperlipidemia Brother     Past Surgical History:  Procedure Laterality Date   ABDOMINAL AORTOGRAM W/LOWER EXTREMITY N/A 02/04/2022   Procedure: ABDOMINAL AORTOGRAM W/LOWER EXTREMITY;  Surgeon: Nada Libman, MD;  Location: [facility];  Service: Cardiovascular;  Laterality: N/A;   ABDOMINAL AORTOGRAM W/LOWER EXTREMITY N/A 02/18/2022   Procedure: ABDOMINAL AORTOGRAM W/LOWER EXTREMITY;  Surgeon: Nada Libman, MD;  Location: [facility];  Service: Cardiovascular;  Laterality: N/A;   ABDOMINAL AORTOGRAM W/LOWER EXTREMITY N/A 04/15/2022   Procedure: ABDOMINAL AORTOGRAM W/LOWER EXTREMITY;  Surgeon: Nada Libman, MD;  Location: [facility];  Service: Cardiovascular;  Laterality: N/A;   ABDOMINAL AORTOGRAM W/LOWER EXTREMITY N/A 10/28/2022   Procedure: ABDOMINAL AORTOGRAM W/LOWER EXTREMITY;  Surgeon: Nada Libman, MD;  Location: [facility];  Service: Cardiovascular;  Laterality: N/A;   AMPUTATION Right 04/16/2022   Procedure: RIGHT 5TH RAY AMPUTATION;  Surgeon: Nadara Mustard, MD;  Location: [facility]  OR;  Service: Orthopedics;  Laterality: Right;   AMPUTATION Right 05/09/2022   Procedure: RIGHT BELOW KNEE AMPUTATION;  Surgeon: Nadara Mustard, MD;  Location: [facility] OR;  Service: Orthopedics;  Laterality: Right;   APPLICATION OF WOUND VAC Right 06/20/2022   Procedure: APPLICATION OF WOUND VAC;  Surgeon: Nadara Mustard, MD;  Location: [facility] OR;  Service: Orthopedics;  Laterality: Right;   COLONOSCOPY     ENDARTERECTOMY Right 09/05/2015   Procedure: ENDARTERECTOMY CAROTID RIGHT;  Surgeon: Sherren Kerns, MD;  Location: [facility] OR;  Service: Vascular;  Laterality: Right;   PATCH ANGIOPLASTY  09/05/2015   Procedure: PATCH ANGIOPLASTY  RIGHT CAROTID ARTERY USING HEMASHIELD PLATINUM FINESSE PATCH;  Surgeon: Sherren Kerns, MD;  Location: [facility] OR;  Service: Vascular;;   PERIPHERAL INTRAVASCULAR LITHOTRIPSY Left 10/28/2022   Procedure: INTRAVASCULAR LITHOTRIPSY;  Surgeon: Nada Libman, MD;  Location: [facility];  Service: Cardiovascular;  Laterality: Left;  L peroneal   PERIPHERAL VASCULAR ATHERECTOMY Left 02/18/2022   Procedure: PERIPHERAL VASCULAR ATHERECTOMY;  Surgeon: Nada Libman, MD;  Location: [facility];  Service: Cardiovascular;  Laterality: Left;  Popliteal and Peroneal   PERIPHERAL VASCULAR BALLOON ANGIOPLASTY  02/04/2022   Procedure: PERIPHERAL VASCULAR BALLOON ANGIOPLASTY;  Surgeon: Nada Libman, MD;  Location: [facility];  Service: Cardiovascular;;   PERIPHERAL VASCULAR BALLOON ANGIOPLASTY Right 04/15/2022   Procedure: PERIPHERAL VASCULAR BALLOON ANGIOPLASTY;  Surgeon: Nada Libman, MD;  Location: [facility];  Service: Cardiovascular;  Laterality: Right;  SFA/POPLITEAL   PERIPHERAL VASCULAR BALLOON ANGIOPLASTY Left 10/28/2022   Procedure: PERIPHERAL VASCULAR BALLOON ANGIOPLASTY;  Surgeon: Nada Libman, MD;  Location: [facility];  Service: Cardiovascular;  Laterality: Left;  L peroneal and L pop   STUMP REVISION Right 06/20/2022   Procedure:  REVISION RIGHT BELOW KNEE AMPUTATION;  Surgeon: Nadara Mustard, MD;  Location: [facility] OR;  Service: Orthopedics;  Laterality: Right;   TONSILLECTOMY     VASECTOMY     Social History   Occupational History   Not on file  Tobacco Use   Smoking status: Never   Smokeless tobacco: Former    Types: Chew    Quit date: 01/01/2015  Vaping Use   Vaping status: Never Used  Substance and Sexual Activity   Alcohol use: Not Currently   Drug use: Not on file   Sexual activity: Not on file

## 2023-03-03 ENCOUNTER — Encounter: Payer: 59 | Admitting: Physical Therapy

## 2023-03-04 ENCOUNTER — Other Ambulatory Visit: Payer: Self-pay

## 2023-03-04 DIAGNOSIS — I739 Peripheral vascular disease, unspecified: Secondary | ICD-10-CM

## 2023-03-17 ENCOUNTER — Encounter: Payer: Self-pay | Admitting: Physical Therapy

## 2023-03-17 ENCOUNTER — Ambulatory Visit (INDEPENDENT_AMBULATORY_CARE_PROVIDER_SITE_OTHER): Payer: 59 | Admitting: Physical Therapy

## 2023-03-17 DIAGNOSIS — M6281 Muscle weakness (generalized): Secondary | ICD-10-CM

## 2023-03-17 DIAGNOSIS — R2689 Other abnormalities of gait and mobility: Secondary | ICD-10-CM | POA: Diagnosis not present

## 2023-03-17 DIAGNOSIS — L98498 Non-pressure chronic ulcer of skin of other sites with other specified severity: Secondary | ICD-10-CM | POA: Diagnosis not present

## 2023-03-17 DIAGNOSIS — R2681 Unsteadiness on feet: Secondary | ICD-10-CM | POA: Diagnosis not present

## 2023-03-17 NOTE — Therapy (Signed)
OUTPATIENT PHYSICAL THERAPY TREATMENT   Patient Name: Clinton [name] MRN: [id number] DOB:Apr 07, 1956, 67 y.o., male Today's Date: 03/17/2023  PCP: Darrow Bussing, MD REFERRING PROVIDER: Delle Reining, PA-C    END OF SESSION:  PT End of Session - 03/17/23 0757     Visit Number 18    Number of Visits 20    Date for PT Re-Evaluation 04/09/23    Authorization Type [employer]    Authorization Time Period 20 PT visits, 10% co-insurance    Authorization - Visit Number 18    Authorization - Number of Visits 20    Progress Note Due on Visit 20    PT Start Time 0757    PT Stop Time 0847    PT Time Calculation (min) 50 min    Activity Tolerance Patient tolerated treatment well    Behavior During Therapy [facility] for tasks assessed/performed                        Past Medical History:  Diagnosis Date   Carotid artery occlusion    Cellulitis    Erectile dysfunction    Hyperlipidemia    Hypertension    Peripheral vascular disease (HCC)    Past Surgical History:  Procedure Laterality Date   ABDOMINAL AORTOGRAM W/LOWER EXTREMITY N/A 02/04/2022   Procedure: ABDOMINAL AORTOGRAM W/LOWER EXTREMITY;  Surgeon: Nada Libman, MD;  Location: [facility];  Service: Cardiovascular;  Laterality: N/A;   ABDOMINAL AORTOGRAM W/LOWER EXTREMITY N/A 02/18/2022   Procedure: ABDOMINAL AORTOGRAM W/LOWER EXTREMITY;  Surgeon: Nada Libman, MD;  Location: [facility];  Service: Cardiovascular;  Laterality: N/A;   ABDOMINAL AORTOGRAM W/LOWER EXTREMITY N/A 04/15/2022   Procedure: ABDOMINAL AORTOGRAM W/LOWER EXTREMITY;  Surgeon: Nada Libman, MD;  Location: [facility];  Service: Cardiovascular;  Laterality: N/A;   ABDOMINAL AORTOGRAM W/LOWER EXTREMITY N/A 10/28/2022   Procedure: ABDOMINAL AORTOGRAM W/LOWER EXTREMITY;  Surgeon: Nada Libman, MD;  Location: [facility];  Service: Cardiovascular;  Laterality: N/A;   AMPUTATION Right 04/16/2022   Procedure: RIGHT 5TH RAY  AMPUTATION;  Surgeon: Nadara Mustard, MD;  Location: [facility] OR;  Service: Orthopedics;  Laterality: Right;   AMPUTATION Right 05/09/2022   Procedure: RIGHT BELOW KNEE AMPUTATION;  Surgeon: Nadara Mustard, MD;  Location: [facility] OR;  Service: Orthopedics;  Laterality: Right;   APPLICATION OF WOUND VAC Right 06/20/2022   Procedure: APPLICATION OF WOUND VAC;  Surgeon: Nadara Mustard, MD;  Location: [facility] OR;  Service: Orthopedics;  Laterality: Right;   COLONOSCOPY     ENDARTERECTOMY Right 09/05/2015   Procedure: ENDARTERECTOMY CAROTID RIGHT;  Surgeon: Sherren Kerns, MD;  Location: [facility] OR;  Service: Vascular;  Laterality: Right;   PATCH ANGIOPLASTY  09/05/2015   Procedure: PATCH ANGIOPLASTY RIGHT CAROTID ARTERY USING HEMASHIELD PLATINUM FINESSE PATCH;  Surgeon: Sherren Kerns, MD;  Location: [facility] OR;  Service: Vascular;;   PERIPHERAL INTRAVASCULAR LITHOTRIPSY Left 10/28/2022   Procedure: INTRAVASCULAR LITHOTRIPSY;  Surgeon: Nada Libman, MD;  [facility]: [facility];  Service: Cardiovascular;  Laterality: Left;  L peroneal   PERIPHERAL VASCULAR ATHERECTOMY Left 02/18/2022   Procedure: PERIPHERAL VASCULAR ATHERECTOMY;  Surgeon: Nada Libman, MD;  [facility]: [facility];  Service: Cardiovascular;  Laterality: Left;  Popliteal and Peroneal   PERIPHERAL VASCULAR BALLOON ANGIOPLASTY  02/04/2022   Procedure: PERIPHERAL VASCULAR BALLOON ANGIOPLASTY;  Surgeon: Nada Libman, MD;  [facility]: [facility];  Service: Cardiovascular;;   PERIPHERAL VASCULAR BALLOON ANGIOPLASTY Right 04/15/2022   Procedure: PERIPHERAL VASCULAR BALLOON ANGIOPLASTY;  Surgeon: Nada Libman, MD;  [facility]: [facility];  Service: Cardiovascular;  Laterality: Right;  SFA/POPLITEAL   PERIPHERAL VASCULAR BALLOON ANGIOPLASTY Left 10/28/2022   Procedure: PERIPHERAL VASCULAR BALLOON ANGIOPLASTY;  Surgeon: Nada Libman, MD;  [facility]: [facility];  Service: Cardiovascular;  Laterality: Left;  L peroneal and L pop    STUMP REVISION Right 06/20/2022   Procedure: REVISION RIGHT BELOW KNEE AMPUTATION;  Surgeon: Nadara Mustard, MD;  Location: [facility] OR;  Service: Orthopedics;  Laterality: Right;   TONSILLECTOMY     VASECTOMY     Patient Active Problem List   Diagnosis Date Noted   Elevated coronary artery calcium score 08/06/2022   Dehiscence of amputation stump of right lower extremity (HCC) 06/20/2022   Acute blood loss anemia 05/19/2022   Post-op pain 05/19/2022   Acute renal failure (HCC) 05/19/2022   S/P BKA (below knee amputation), right (HCC) 05/14/2022   Below-knee amputation of right lower extremity (HCC) 05/09/2022   Gangrene of right foot (HCC)    PAD (peripheral artery disease) (HCC) 04/17/2022   Acute osteomyelitis of metatarsal bone of right foot (HCC)    Atherosclerosis of native arteries of the extremities with ulceration (HCC) 04/15/2022   Peripheral arterial disease (HCC) 03/25/2022   Obesity 03/21/2022   Prediabetes 03/21/2022   Erectile dysfunction 07/26/2021   Family history of ischemic heart disease (IHD) 07/26/2021   Pure hypercholesterolemia 07/26/2021   History of adenomatous polyp of colon 02/03/2019   Internal hemorrhoids 02/03/2019   Carotid artery stenosis, asymptomatic 09/05/2015   Cellulitis    Hypertension    Hyperlipidemia    Occlusion and stenosis of carotid artery without mention of cerebral infarction 01/01/2012    ONSET DATE: 10/15/2022 prosthesis delivery  REFERRING DIAG: Z61.096E (ICD-10-CM) - Below-knee amputation of right lower extremity   THERAPY DIAG:  Other abnormalities of gait and mobility  Unsteadiness on feet  Muscle weakness (generalized)  Non-pressure chronic ulcer of skin of other sites with other specified severity (HCC)  Rationale for Evaluation and Treatment: Rehabilitation  SUBJECTIVE:  SUBJECTIVE STATEMENT:   The wound appears sealed but the indention is still present. He no longer feels the bruising of limb.   PERTINENT HISTORY:  carotid artery occlusion, HTN, PVD, BLE vascular sugeries  PAIN:  Are you having pain? Limb pain 0/10 with weight bearing in last week up to  3/10  PRECAUTIONS: None  WEIGHT BEARING RESTRICTIONS: No  FALLS: Has patient fallen in last 6 months? No  LIVING ENVIRONMENT: Lives with: lives with their spouse Lives in: House  single level Home Access: Stairs to enter Home layout: One level Stairs: Yes: External: 3 steps; can reach both Has following equipment at home: Single point cane, Walker - 2 wheeled, Wheelchair (manual), shower chair, and knee scooter  PLOF: Independent, Independent with household mobility without device, and Independent with community mobility without device  circulation issues started in 2019 with worsening 2021,   PATIENT GOALS:   to use prosthesis to travel, golf, climb ladder, go to gym, active in community including coaching,    OBJECTIVE:  COGNITION: Overall cognitive status: Within functional limits for tasks assessed   SENSATION: WFL  POSTURE: flexed trunk  and weight shift left  LOWER EXTREMITY ROM:  ROM P:passive  A:active Right eval  Hip flexion   Hip extension   Hip abduction   Hip adduction  Hip internal rotation   Hip external rotation   Knee flexion WFL  Knee extension WFL  Ankle dorsiflexion   Ankle plantarflexion   Ankle inversion   Ankle eversion    (Blank rows = not tested)  LOWER EXTREMITY MMT:  MMT Right eval Left eval  Hip flexion 5/5 5/5  Hip extension 4/5   Hip abduction 4/5   Hip adduction    Hip internal rotation    Hip external rotation    Knee flexion 4/5   Knee extension 4/5   Ankle dorsiflexion    Ankle plantarflexion    Ankle inversion    Ankle eversion    (Blank rows = not tested)  TRANSFERS: 11/17/2022: sit to /from stand: independent without UE assist  10/22/2022 / Eval: Sit to stand: Modified independence Stand to sit: Modified independence  GAIT: 03/17/2023: Pt neg ramp & curb without  device modified independent. Pt neg 11 steps with left rail alternating pattern with minor cues on foot position descending.  Gait Velocity: self-selected / comfortable: 3.72 ft/sec and fast 4.72 ft/sec Functional Gait Assessment 20/30  [facility] PT Assessment - 03/17/23 0800       Functional Gait  Assessment   Gait assessed  Yes    Gait Level Surface Walks 20 ft in less than 5.5 sec, no assistive devices, good speed, no evidence for imbalance, normal gait pattern, deviates no more than 6 in outside of the 12 in walkway width.    Change in Gait Speed Able to smoothly change walking speed without loss of balance or gait deviation. Deviate no more than 6 in outside of the 12 in walkway width.    Gait with Horizontal Head Turns Performs head turns smoothly with slight change in gait velocity (eg, minor disruption to smooth gait path), deviates 6-10 in outside 12 in walkway width, or uses an assistive device.    Gait with Vertical Head Turns Performs task with slight change in gait velocity (eg, minor disruption to smooth gait path), deviates 6 - 10 in outside 12 in walkway width or uses assistive device    Gait and Pivot Turn Pivot turns safely in greater than 3 sec and stops with no loss of balance, or pivot turns safely within 3 sec and stops with mild imbalance, requires small steps to catch balance.    Step Over Obstacle Is able to step over one shoe box (4.5 in total height) without changing gait speed. No evidence of imbalance.    Gait with Narrow Base of Support Ambulates 4-7 steps.    Gait with Eyes Closed Walks 20 ft, slow speed, abnormal gait pattern, evidence for imbalance, deviates 10-15 in outside 12 in walkway width. Requires more than 9 sec to ambulate 20 ft.    Ambulating Backwards Walks 20 ft, uses assistive device, slower speed, mild gait deviations, deviates 6-10 in outside 12 in walkway width.    Steps Alternating feet, must use rail.    Total Score 20               01/13/2023: Gait Velocity: no device comfortable / self-selected pace 3.66 ft/sec and fast pace 4.60 ft/sec Pt amb 500' without device with cues only.  Pt neg ramp & curb without device with supervision / guarded motions. Pt neg stairs alternating pattern with single rail with cues for technique.    11/17/2022:  Pt amb community distances with cane including neg ramps & curbs safely.  Pt amb up to 200' (residual limb pain limits  distances) without device safely with verbal cues only to correct deviations. Gait Velocity: self-selected 2.13 ft/sec & fast pace 2.67 ft/sec Functional Gait Assessment: 10/30  10/22/2022 / Eval: Gait pattern: step to pattern, decreased step length- Left, decreased stance time- Right, decreased hip/knee flexion- Right, Right hip hike, knee flexed in stance- Right, antalgic, trunk flexed, and abducted- Right Distance walked: 50' Assistive device utilized: Environmental consultant - 2 wheeled and TTA prosthesis Level of assistance: SBA / cues for deviations no balance issues noted with RW Comments: due to wound issues PT recommending RW for BUE support initially  FUNCTIONAL TESTs:  01/13/2023: Timed Up & Go without device except prosthesis: standard 8.94 sec and cognitive 8.94 sec Four Square Step Test 14.18 & 13.50 sec both indicating fall risk Functional Gait Assessment 16/30 without device. Indicates high fall risk.   [facility] PT Assessment - 03/17/23 0800       Functional Gait  Assessment   Gait assessed  Yes    Gait Level Surface Walks 20 ft in less than 5.5 sec, no assistive devices, good speed, no evidence for imbalance, normal gait pattern, deviates no more than 6 in outside of the 12 in walkway width.    Change in Gait Speed Able to smoothly change walking speed without loss of balance or gait deviation. Deviate no more than 6 in outside of the 12 in walkway width.    Gait with Horizontal Head Turns Performs head turns smoothly with slight change in gait velocity (eg,  minor disruption to smooth gait path), deviates 6-10 in outside 12 in walkway width, or uses an assistive device.    Gait with Vertical Head Turns Performs task with slight change in gait velocity (eg, minor disruption to smooth gait path), deviates 6 - 10 in outside 12 in walkway width or uses assistive device    Gait and Pivot Turn Pivot turns safely in greater than 3 sec and stops with no loss of balance, or pivot turns safely within 3 sec and stops with mild imbalance, requires small steps to catch balance.    Step Over Obstacle Is able to step over one shoe box (4.5 in total height) without changing gait speed. No evidence of imbalance.    Gait with Narrow Base of Support Ambulates 4-7 steps.    Gait with Eyes Closed Walks 20 ft, slow speed, abnormal gait pattern, evidence for imbalance, deviates 10-15 in outside 12 in walkway width. Requires more than 9 sec to ambulate 20 ft.    Ambulating Backwards Walks 20 ft, uses assistive device, slower speed, mild gait deviations, deviates 6-10 in outside 12 in walkway width.    Steps Alternating feet, must use rail.    Total Score 20              11/17/2022: Berg Balance 50/56 & Functional Gait Assessment 10/30  Eval / 10/22/2022:  Sharlene Motts Balance Scale: 36/56  CURRENT PROSTHETIC WEAR ASSESSMENT: 01/13/2023: patient tolerates prosthesis wear >90% of awake hours. He has wound with scab at distal tibia present which is healing still.  He has limb pain 0-4/10 with prosthesis wear & weight bearing but significant improvement.  Pt is independent with: skin check, residual limb care, prosthetic cleaning, ply sock cleaning, correct ply sock adjustment, proper wear schedule/adjustment, and proper weight-bearing schedule/adjustment   11/17/2022: Pt wearing prosthesis most of awake hours except bathing & last 1-1.5 hours of day. Wound scab at distal tibia. PT continues to advise / problem-solve limb pain & other prosthetic issues.  Eval / 10/22/2022: Patient  is dependent with: skin check, residual limb care, prosthetic cleaning, ply sock cleaning, correct ply sock adjustment, proper wear schedule/adjustment, and proper weight-bearing schedule/adjustment Donning prosthesis: SBA Doffing prosthesis: Modified independence Prosthetic wear tolerance: 1-1.5 hours, 2x/day, for last 2 days.  He wore prosthesis 1hr 2x on 3/8 and got water blisters.  Stopped wear on 3/9 & 3/10, resumed 3/11 1.5 hrs 2x/day for 2 days.  Prosthetic weight bearing tolerance: 5 minutes Edema: pitting Residual limb condition: draining (serous) wound at distal tibia, 2nd open blister on distal limb, minimal hair growth, cylindrical shape, no warmth or abnormal color changes noted.  Prosthetic description: silicon liner with pin lock suspension, total contact socket with flexible inner liner, 0-ply socks, dynamic response foot K code/activity level with prosthetic use: Level 3    TODAY'S TREATMENT:                                                                                                                             DATE:  03/17/2023: Prosthetic Training with TTA prosthesis: PT instructed in adjusting position of prosthesis to sit with equal weight bearing. Pt verbalized understanding and reports feels better. PT demo & verbal cues on standing with equal weight bearing and touching counter with RUE lightly when able to facilitate weight to right. Pt verbalized and return demo understanding.  PT demo & verbal cues on turning from stationary position and turning 90* around table. Pt return demo and improved with repetition.   PT used internet to show pt a rotator unit which could improve sitting in confined spaces. Pt verbalized understanding.  PT instructed pt in actual vs perceived weight with changes anticipated with more intimate fitting socket. PT instructed in residual limb pain, phantom sensation vs phantom pain. Pt verbalized understanding.     02/17/2023: Prosthetic Training  with TTA prosthesis: PT educated in Positioning seated so weight of prosthesis is taken by floor. Pt verbalized understanding.  PT assessed pelvis height in standing equal weight bearing and prosthesis appears proper height.  PT recommended using Adjust-a-Lift in left shoe and progressively decreasing until removes. This will allow perception that prosthesis is too high until he can accommodate to height. Pt verbalized understanding. Continue to do HEP for ambulation to improve dynamic balance. Pt verbalized understanding.  Neuromuscular Re-education: Standing with equal weight bearing LEs with UE resistance blue T-band alternating UEs & BUEs 10 reps each for row, forward reach & biceps curl. Added to HEP with verbal, demo & HO cues. Pt verbalized & return demo understanding.  Standing T-band green hip abd, ext, add & SLR flex 10 reps ea BLEs with light chair back support. Added to HEP with verbal, demo & HO cues. Pt verbalized & return demo understanding.    02/03/2023: Prosthetic Training with TTA prosthesis:  -PT demo & verbal cues on use of suction seal vacuum water bag. He needs to remove all air from system by pumping until bulb does  not re-inflate and may need 1-2# ankle weight on prosthesis if submerging foot.  Pt verbalized understanding.  -pt reported some distal patella pressure.  PT instructed in proper positioning of patella tendon bar and probably needed more ply socks so limb does not sit so deep into socket. Pt verbalized understanding.  -Patient reports large fluctuations in ply socks from morning to night.  PT discussed may be having fluid retention at night with only Vivewear sock and may need to consider use of tubal compression over Vivewear to minimize changes. At this point in prosthesis use would expect up to 5-ply change at most during day. Pt verbalized understanding.  -PT assessed prosthesis height with pelvis in standing and lift plates.  His prosthesis does appear ~1/4"  tall. -He reported more discomfort when initiates walking after he sits with knee bent in tight area like car but not so much if he can extend LE out.  PT educated may be due to posterior socket pressure in sitting. At next appt with prosthetist discuss design of posterior socket with middle section rolled more and increase depth of hamstring relieves. Pt verbalized understanding.  -PT educated that he needs to work on the following as PT noting some differences like timing with activities. To return to his target activities he needs to be more fluent. Pt verbalized understanding.  stepping over obstacles - lead with prosthesis to see it clear. curb leading with either LE - try to maintain momentum scanning during gait _ both lines of T & X goal is to maintain path & pace alternating 3 steps eyes open & eyes closed picking up pace  stairs alternating pattern 4 square stepping clockwise & counterclockwise   HOME EXERCISE PROGRAM: Access Code: [email or web address] URL: [email or web address] Date: 02/17/2023 Prepared by: Vladimir Faster  Exercises - Wide stance on Foam Pad head movements  - 1 x daily - 4 x weekly - 1 sets - 5-10 reps - Alternating Punch with Resistance  - 1 x daily - 3-4 x weekly - 1 sets - 10 reps - 5 seconds hold - Standing Scapular Protraction with Resistance  - 1 x daily - 3-4 x weekly - 1 sets - 10 reps - 5 seconds hold - Standing alternate rows with resistance  - 1 x daily - 3-4 x weekly - 1 sets - 10 reps - 5 seconds hold - Standing Row with Anchored Resistance  - 1 x daily - 3-4 x weekly - 1 sets - 10 reps - 5 seconds hold - Alternating elbow flexion with resistance  - 1 x daily - 3-4 x weekly - 1 sets - 10 reps - 5 seconds hold - Standing Bicep Curls with Resistance  - 1 x daily - 3-4 x weekly - 1 sets - 10 reps - 5 seconds hold - Standing Hip Flexion with Resistance (Mirrored)  - 1 x daily - 3-4 x weekly - 1 sets - 10 reps - Standing Hip Adduction with Resistance  (Mirrored)  - 1 x daily - 3-4 x weekly - 1 sets - 10 reps - Standing Hip Extension with Resistance  - 1 x daily - 3-4 x weekly - 1 sets - 10 reps - Standing Hip Abduction with Theraband Resistance  - 1 x daily - 3-4 x weekly - 1 sets - 10 reps  HEP 11/04/2022 with PT demo & verbal cues. Pt & wife verbalized understanding with Pt return demo.  1.  Sidestepping right and left along counter with cues on engaging bilateral  lower extremities. 10' 3 laps.  2.  Counter RUE and cane LUE -walking forward with equal step length using heel toe as marker and walking backwards with weight shift over the prosthesis in stance. 10' 3 laps.   3.  Weight shift over RLE in stance with tightening hip muscles for stabilization tapping left forefoot in lower cabinet. 10 reps 2 sets.  4. Sit to / from stand 18" chair without armrest using upper extremities lightly on seat.  PT demo and verbal cues on technique to decrease upper extremity support and increased lower extremity utilization.  10 reps 2 sets.  5.  Picking up washcloth from floor. PT demo & verbal cues on technique with TTA prosthesis and set-up for safety.  10 reps 2 sets.  stepping over obstacles - lead with prosthesis to see it clear. curb leading with either LE - try to maintain momentum scanning during gait _ both lines of T & X goal is to maintain path & pace alternating 3 steps eyes open & eyes closed picking up pace  stairs alternating pattern 4 square stepping clockwise & counterclockwise ASSESSMENT: CLINICAL IMPRESSION: Patient continues to improve function with PT directions for issues that are still bothering him.  PT anticipates discharge at next appointment after his vacation.   OBJECTIVE IMPAIRMENTS: Abnormal gait, decreased activity tolerance, decreased balance, decreased knowledge of condition, decreased knowledge of use of DME, decreased mobility, difficulty walking, decreased strength, increased edema, prosthetic dependency , and wound on  residual limb .   ACTIVITY LIMITATIONS: carrying, lifting, bending, standing, squatting, stairs, transfers, locomotion level, and prosthesis use  PARTICIPATION LIMITATIONS: meal prep, cleaning, driving, community activity, occupation, yard work, and recreational activities.  PERSONAL FACTORS: Fitness, Time since onset of injury/illness/exacerbation, and 3+ comorbidities: see PMH  are also affecting patient's functional outcome.   REHAB POTENTIAL: Good  CLINICAL DECISION MAKING: Evolving/moderate complexity  EVALUATION COMPLEXITY: Moderate   GOALS: Goals reviewed with patient? Yes  SHORT TERM GOALS: Target date: visit 18  Patient able to negotiate ramp & curb without device safely.  Baseline: SEE OBJECTIVE DATA Goal status: MET 03/17/2023 2.  Patient tolerates prosthesis >90% awake hours /day and weight bearing activities throughout day with limb pain </=2/10. Baseline: SEE OBJECTIVE DATA Goal status: MET 03/17/2023  3.  Functional Gait Assessment >/=19/30 Baseline: SEE OBJECTIVE DATA Goal status: MET 03/17/2023  4. Patient negotiates stairs with single rail alternating pattern modified independent.  Baseline: SEE OBJECTIVE DATA Goal status: partially MET 03/17/2023   UPDATED LONG TERM GOALS: Target date: 04/09/2023   Patient tolerates prosthesis wear >90% of awake hours without skin or limb pain issues. Baseline: SEE OBJECTIVE DATA Goal status: ongoing 02/17/2023  2.  Functional Gait Assessment >/= 22/30 to indicate lower fall risk Baseline: SEE OBJECTIVE DATA  Goal status: ongoing 02/17/2023  3.  Patient ambulates >1000' with prosthesis only independently Baseline: SEE OBJECTIVE DATA Goal status: ongoing 02/17/2023  4.  Patient negotiates ramps, curbs & stairs with single rail with prosthesis only independently. Baseline: SEE OBJECTIVE DATA Goal status: ongoing 02/17/2023  5.  Patient demonstrates & verbalizes ability to perform yard work & golf with prosthesis only  safely. Baseline: SEE OBJECTIVE DATA Goal status: ongoing 02/17/2023  6.  Four Square Step Test <10 sec safely to indicate lower fall risk.  Baseline: SEE OBJECTIVE DATA Goal status:  ongoing 02/17/2023  PLAN:  PT FREQUENCY: 1x every 1-2 weeks  PT DURATION: 5 additional visits  PLANNED INTERVENTIONS: Therapeutic exercises, Therapeutic activity, Neuromuscular re-education, Balance training,  Gait training, Patient/Family education, Self Care, Stair training, Prosthetic training, Re-evaluation, and physical performance testing.  PLAN FOR NEXT SESSION: assess LTGs and discharge.   Vladimir Faster, PT, DPT 03/17/2023, 9:06 AM

## 2023-03-20 ENCOUNTER — Encounter: Payer: 59 | Attending: Physical Medicine and Rehabilitation | Admitting: Physical Medicine and Rehabilitation

## 2023-03-20 ENCOUNTER — Encounter: Payer: Self-pay | Admitting: Physical Medicine and Rehabilitation

## 2023-03-20 VITALS — BP 178/73 | HR 71 | Ht 70.0 in | Wt 204.0 lb

## 2023-03-20 DIAGNOSIS — Z89511 Acquired absence of right leg below knee: Secondary | ICD-10-CM | POA: Diagnosis not present

## 2023-03-20 DIAGNOSIS — S88111D Complete traumatic amputation at level between knee and ankle, right lower leg, subsequent encounter: Secondary | ICD-10-CM | POA: Insufficient documentation

## 2023-03-20 DIAGNOSIS — T148XXA Other injury of unspecified body region, initial encounter: Secondary | ICD-10-CM | POA: Diagnosis present

## 2023-03-20 DIAGNOSIS — S80821A Blister (nonthermal), right lower leg, initial encounter: Secondary | ICD-10-CM

## 2023-03-20 NOTE — Progress Notes (Signed)
Subjective:    Patient ID: Clinton [name], male    DOB: 29-Nov-1955, 67 y.o.   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     Has gotten new blister and pain on bottom of R BKA.   Had been doing great.   No other time this has occurred.   No major skin issues since wearing prosthesis.  But seeing Derm about sweating- and see if that is reason why got blister that popped.    Pain Inventory Average Pain 1 Pain Right Now 4 My pain is dull  In the last 24 hours, has pain interfered with the following? General activity 0 Relation with others 0 Enjoyment of life 1 What TIME of day is your pain at its worst? morning  Sleep (in general) Good  Pain is worse with: standing Pain improves with:  no pain Relief from Meds:  none  Family History  Problem Relation Age of Onset   Other Mother        circulatory problems   Heart attack Mother    Deep vein thrombosis Mother    Heart disease Mother    Hyperlipidemia Father    Hypertension Father    Diabetes Father    Deep vein thrombosis Father    Heart disease Father        before age 59   Heart attack Father    Peripheral vascular disease Father    Hypertension Brother    Hyperlipidemia Brother    Social History   Socioeconomic History   Marital status: Married    Spouse name: Not on file   Number of children: Not on file   Years of education: Not on file   Highest education level: Not on file  Occupational History   Not on file  Tobacco Use   Smoking status: Never   Smokeless tobacco: Former    Types: Chew    Quit date: 01/01/2015  Vaping Use   Vaping status: Never Used  Substance and Sexual Activity   Alcohol use: Not Currently   Drug use: Not on file   Sexual activity: Not on file  Other Topics Concern   Not on file  Social History Narrative   Not on file   Social Determinants of Health   Financial Resource Strain:  Not on file  Food Insecurity: No Food Insecurity (05/10/2022)   Hunger Vital Sign    Worried About Running Out of Food in the Last Year: Never true    Ran Out of Food in the Last Year: Never true  Transportation Needs: No Transportation Needs (05/10/2022)   PRAPARE - Administrator, Civil Service (Medical): No    Lack of Transportation (Non-Medical): No  Physical Activity: Not on file  Stress: Not on file  Social Connections: Not on file   Past Surgical History:  Procedure Laterality Date   ABDOMINAL AORTOGRAM W/LOWER EXTREMITY N/A 02/04/2022   Procedure: ABDOMINAL AORTOGRAM W/LOWER EXTREMITY;  Surgeon: Nada Libman, MD;  Location: [facility];  Service: Cardiovascular;  Laterality: N/A;   ABDOMINAL AORTOGRAM W/LOWER EXTREMITY N/A 02/18/2022   Procedure: ABDOMINAL AORTOGRAM W/LOWER EXTREMITY;  Surgeon: Nada Libman, MD;  Location: [facility];  Service: Cardiovascular;  Laterality: N/A;   ABDOMINAL AORTOGRAM W/LOWER EXTREMITY N/A 04/15/2022   Procedure: ABDOMINAL AORTOGRAM W/LOWER EXTREMITY;  Surgeon:  Nada Libman, MD;  Location: [facility];  Service: Cardiovascular;  Laterality: N/A;   ABDOMINAL AORTOGRAM W/LOWER EXTREMITY N/A 10/28/2022   Procedure: ABDOMINAL AORTOGRAM W/LOWER EXTREMITY;  Surgeon: Nada Libman, MD;  Location: [facility];  Service: Cardiovascular;  Laterality: N/A;   AMPUTATION Right 04/16/2022   Procedure: RIGHT 5TH RAY AMPUTATION;  Surgeon: Nadara Mustard, MD;  Location: [facility] OR;  Service: Orthopedics;  Laterality: Right;   AMPUTATION Right 05/09/2022   Procedure: RIGHT BELOW KNEE AMPUTATION;  Surgeon: Nadara Mustard, MD;  Location: [facility] OR;  Service: Orthopedics;  Laterality: Right;   APPLICATION OF WOUND VAC Right 06/20/2022   Procedure: APPLICATION OF WOUND VAC;  Surgeon: Nadara Mustard, MD;  Location: [facility];  Service: Orthopedics;  Laterality: Right;   COLONOSCOPY     ENDARTERECTOMY Right 09/05/2015   Procedure:  ENDARTERECTOMY CAROTID RIGHT;  Surgeon: Sherren Kerns, MD;  Location: [facility] OR;  Service: Vascular;  Laterality: Right;   PATCH ANGIOPLASTY  09/05/2015   Procedure: PATCH ANGIOPLASTY RIGHT CAROTID ARTERY USING HEMASHIELD PLATINUM FINESSE PATCH;  Surgeon: Sherren Kerns, MD;  Location: [facility] OR;  Service: Vascular;;   PERIPHERAL INTRAVASCULAR LITHOTRIPSY Left 10/28/2022   Procedure: INTRAVASCULAR LITHOTRIPSY;  Surgeon: Nada Libman, MD;  [facility]: [facility];  Service: Cardiovascular;  Laterality: Left;  L peroneal   PERIPHERAL VASCULAR ATHERECTOMY Left 02/18/2022   Procedure: PERIPHERAL VASCULAR ATHERECTOMY;  Surgeon: Nada Libman, MD;  [facility]: [facility];  Service: Cardiovascular;  Laterality: Left;  Popliteal and Peroneal   PERIPHERAL VASCULAR BALLOON ANGIOPLASTY  02/04/2022   Procedure: PERIPHERAL VASCULAR BALLOON ANGIOPLASTY;  Surgeon: Nada Libman, MD;  [facility];  Service: Cardiovascular;;   PERIPHERAL VASCULAR BALLOON ANGIOPLASTY Right 04/15/2022   Procedure: PERIPHERAL VASCULAR BALLOON ANGIOPLASTY;  Surgeon: Nada Libman, MD;  [facility];  Service: Cardiovascular;  Laterality: Right;  SFA/POPLITEAL   PERIPHERAL VASCULAR BALLOON ANGIOPLASTY Left 10/28/2022   Procedure: PERIPHERAL VASCULAR BALLOON ANGIOPLASTY;  Surgeon: Nada Libman, MD;  [facility];  Service: Cardiovascular;  Laterality: Left;  L peroneal and L pop   STUMP REVISION Right 06/20/2022   Procedure: REVISION RIGHT BELOW KNEE AMPUTATION;  Surgeon: Nadara Mustard, MD;  Location: [facility] OR;  Service: Orthopedics;  Laterality: Right;   TONSILLECTOMY     VASECTOMY     Past Surgical History:  Procedure Laterality Date   ABDOMINAL AORTOGRAM W/LOWER EXTREMITY N/A 02/04/2022   Procedure: ABDOMINAL AORTOGRAM W/LOWER EXTREMITY;  Surgeon: Nada Libman, MD;  [facility];  Service: Cardiovascular;  Laterality: N/A;   ABDOMINAL AORTOGRAM  W/LOWER EXTREMITY N/A 02/18/2022   Procedure: ABDOMINAL AORTOGRAM W/LOWER EXTREMITY;  Surgeon: Nada Libman, MD;  [facility];  Service: Cardiovascular;  Laterality: N/A;   ABDOMINAL AORTOGRAM W/LOWER EXTREMITY N/A 04/15/2022   Procedure: ABDOMINAL AORTOGRAM W/LOWER EXTREMITY;  Surgeon: Nada Libman, MD;  [facility];  Service: Cardiovascular;  Laterality: N/A;   ABDOMINAL AORTOGRAM W/LOWER EXTREMITY N/A 10/28/2022   Procedure: ABDOMINAL AORTOGRAM W/LOWER EXTREMITY;  Surgeon: Nada Libman, MD;  [facility];  Service: Cardiovascular;  Laterality: N/A;   AMPUTATION Right 04/16/2022   Procedure: RIGHT 5TH RAY AMPUTATION;  Surgeon: Nadara Mustard, MD;  Location: [facility] OR;  Service: Orthopedics;  Laterality: Right;   AMPUTATION Right 05/09/2022   Procedure: RIGHT BELOW KNEE AMPUTATION;  Surgeon: Nadara Mustard,  MD;  Location: [facility];  Service: Orthopedics;  Laterality: Right;   APPLICATION OF WOUND VAC Right 06/20/2022   Procedure: APPLICATION OF WOUND VAC;  Surgeon: Nadara Mustard, MD;  Location: [facility];  Service: Orthopedics;  Laterality: Right;   COLONOSCOPY     ENDARTERECTOMY Right 09/05/2015   Procedure: ENDARTERECTOMY CAROTID RIGHT;  Surgeon: Sherren Kerns, MD;  Location: [facility] OR;  Service: Vascular;  Laterality: Right;   PATCH ANGIOPLASTY  09/05/2015   Procedure: PATCH ANGIOPLASTY RIGHT CAROTID ARTERY USING HEMASHIELD PLATINUM FINESSE PATCH;  Surgeon: Sherren Kerns, MD;  Location: [facility] OR;  Service: Vascular;;   PERIPHERAL INTRAVASCULAR LITHOTRIPSY Left 10/28/2022   Procedure: INTRAVASCULAR LITHOTRIPSY;  Surgeon: Nada Libman, MD;  [facility];  Service: Cardiovascular;  Laterality: Left;  L peroneal   PERIPHERAL VASCULAR ATHERECTOMY Left 02/18/2022   Procedure: PERIPHERAL VASCULAR ATHERECTOMY;  Surgeon: Nada Libman, MD;  [facility];  Service: Cardiovascular;  Laterality: Left;  Popliteal and Peroneal    PERIPHERAL VASCULAR BALLOON ANGIOPLASTY  02/04/2022   Procedure: PERIPHERAL VASCULAR BALLOON ANGIOPLASTY;  Surgeon: Nada Libman, MD;  [facility];  Service: Cardiovascular;;   PERIPHERAL VASCULAR BALLOON ANGIOPLASTY Right 04/15/2022   Procedure: PERIPHERAL VASCULAR BALLOON ANGIOPLASTY;  Surgeon: Nada Libman, MD;  [facility];  Service: Cardiovascular;  Laterality: Right;  SFA/POPLITEAL   PERIPHERAL VASCULAR BALLOON ANGIOPLASTY Left 10/28/2022   Procedure: PERIPHERAL VASCULAR BALLOON ANGIOPLASTY;  Surgeon: Nada Libman, MD;  [facility];  Service: Cardiovascular;  Laterality: Left;  L peroneal and L pop   STUMP REVISION Right 06/20/2022   Procedure: REVISION RIGHT BELOW KNEE AMPUTATION;  Surgeon: Nadara Mustard, MD;  Location: [facility] OR;  Service: Orthopedics;  Laterality: Right;   TONSILLECTOMY     VASECTOMY     Past Medical History:  Diagnosis Date   Carotid artery occlusion    Cellulitis    Erectile dysfunction    Hyperlipidemia    Hypertension    Peripheral vascular disease (HCC)    BP (!) 176/74   Pulse 71   Ht 5\' 10"  (1.778 m)   Wt 204 lb (92.5 kg)   SpO2 96%   BMI 29.27 kg/m   Opioid Risk Score:   Fall Risk Score:  `1  Depression screen [facility] 2/9     06/16/2022    3:19 PM 04/08/2022    3:30 PM  Depression screen PHQ 2/9  Decreased Interest 0 0  Down, Depressed, Hopeless 0 0  PHQ - 2 Score 0 0  Altered sleeping 0   Tired, decreased energy 0   Change in appetite 0   Feeling bad or failure about yourself  0   Trouble concentrating 0   Moving slowly or fidgety/restless 0   Suicidal thoughts 0   PHQ-9 Score 0      Review of Systems  Musculoskeletal:        RT leg  All other systems reviewed and are negative.      Objective:   Physical Exam   Awake, alert, appropriate, accompanied by wife- took prosthesis off.         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    Aquaphor- or Vaseline-  use small amount 2x/day and cover with larger bandaid- apply with q-tip-  2 take this weekend off wearing prosthesis- restart Monday- and build back up time wise and use more socks.    3.   Call [facility] to see if can be seen - Thayer Ohm- - spoke with Thayer Ohm from room- to get him in with [facility].    4.  Don't wear prosthesis until Monday AM and see [facility] before leaves town next week- if gets worse looking- then wait to put back on again-but should be ok once [facility] makes changes.    5.  Hydrate well when sitting outside- to replace water he loses   6/ F/U in 6 months- f/u R BKA.    I spent a total of  32  minutes on total care today- >50% coordination of care- due to  F/u on R BKA- and blister-

## 2023-03-20 NOTE — Patient Instructions (Signed)
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    Aquaphor- or Vaseline-  use small amount 2x/day and cover with larger bandaid- apply with q-tip-   2 take this weekend off wearing prosthesis- restart Monday- and build back up time wise and use more socks.    3.   Call [facility] to see if can be seen - Thayer Ohm- - spoke with Thayer Ohm from room- to get him in with [facility].    4.  Don't wear prosthesis until Monday AM and see [facility] before leaves town next week- if gets worse looking- then wait to put back on again-but should be ok once [facility] makes changes.    5.  Hydrate well when sitting outside- to replace water he loses   6/ F/U in 6 months- f/u R BKA.    I spent a total of  32  minutes on total care today- >50% coordination of care- due to  F/u on R BKA- and blister-

## 2023-04-08 ENCOUNTER — Ambulatory Visit (INDEPENDENT_AMBULATORY_CARE_PROVIDER_SITE_OTHER): Payer: 59 | Admitting: Physical Therapy

## 2023-04-08 ENCOUNTER — Encounter: Payer: Self-pay | Admitting: Physical Therapy

## 2023-04-08 DIAGNOSIS — M6281 Muscle weakness (generalized): Secondary | ICD-10-CM | POA: Diagnosis not present

## 2023-04-08 DIAGNOSIS — R2681 Unsteadiness on feet: Secondary | ICD-10-CM | POA: Diagnosis not present

## 2023-04-08 DIAGNOSIS — L98498 Non-pressure chronic ulcer of skin of other sites with other specified severity: Secondary | ICD-10-CM | POA: Diagnosis not present

## 2023-04-08 DIAGNOSIS — R2689 Other abnormalities of gait and mobility: Secondary | ICD-10-CM

## 2023-04-08 NOTE — Therapy (Signed)
OUTPATIENT PHYSICAL THERAPY TREATMENT & DISCHARGE SUMMARY   Patient Name: Clinton [name] MRN: [id number] DOB:1956-04-24, 67 y.o., male Today's Date: 04/08/2023  PCP: Darrow Bussing, MD REFERRING PROVIDER: Delle Reining, PA-C   PHYSICAL THERAPY DISCHARGE SUMMARY  Visits from Start of Care: 19  Current functional level related to goals / functional outcomes: See below   Remaining deficits: See impression statement below.   Education / Equipment: Patient was educated in prosthetic care & HEP which he appears to understand.    Patient agrees to discharge. Patient goals were met. Patient is being discharged due to meeting the stated rehab goals.   END OF SESSION:  PT End of Session - 04/08/23 0802     Visit Number 19    Number of Visits 20    Date for PT Re-Evaluation 04/09/23    Authorization Type [employer]    Authorization Time Period 20 PT visits, 10% co-insurance    Authorization - Visit Number 19    Authorization - Number of Visits 20    Progress Note Due on Visit 20    PT Start Time 0800    PT Stop Time 0824    PT Time Calculation (min) 24 min    Activity Tolerance Patient tolerated treatment well    Behavior During Therapy WFL for tasks assessed/performed                         Past Medical History:  Diagnosis Date   Carotid artery occlusion    Cellulitis    Erectile dysfunction    Hyperlipidemia    Hypertension    Peripheral vascular disease (HCC)    Past Surgical History:  Procedure Laterality Date   ABDOMINAL AORTOGRAM W/LOWER EXTREMITY N/A 02/04/2022   Procedure: ABDOMINAL AORTOGRAM W/LOWER EXTREMITY;  Surgeon: Nada Libman, MD;  Location: [facility];  Service: Cardiovascular;  Laterality: N/A;   ABDOMINAL AORTOGRAM W/LOWER EXTREMITY N/A 02/18/2022   Procedure: ABDOMINAL AORTOGRAM W/LOWER EXTREMITY;  Surgeon: Nada Libman, MD;  Location: [facility];  Service: Cardiovascular;  Laterality: N/A;   ABDOMINAL AORTOGRAM  W/LOWER EXTREMITY N/A 04/15/2022   Procedure: ABDOMINAL AORTOGRAM W/LOWER EXTREMITY;  Surgeon: Nada Libman, MD;  Location: [facility];  Service: Cardiovascular;  Laterality: N/A;   ABDOMINAL AORTOGRAM W/LOWER EXTREMITY N/A 10/28/2022   Procedure: ABDOMINAL AORTOGRAM W/LOWER EXTREMITY;  Surgeon: Nada Libman, MD;  Location: [facility];  Service: Cardiovascular;  Laterality: N/A;   AMPUTATION Right 04/16/2022   Procedure: RIGHT 5TH RAY AMPUTATION;  Surgeon: Nadara Mustard, MD;  Location: [facility] OR;  Service: Orthopedics;  Laterality: Right;   AMPUTATION Right 05/09/2022   Procedure: RIGHT BELOW KNEE AMPUTATION;  Surgeon: Nadara Mustard, MD;  Location: [facility] OR;  Service: Orthopedics;  Laterality: Right;   APPLICATION OF WOUND VAC Right 06/20/2022   Procedure: APPLICATION OF WOUND VAC;  Surgeon: Nadara Mustard, MD;  Location: [facility];  Service: Orthopedics;  Laterality: Right;   COLONOSCOPY     ENDARTERECTOMY Right 09/05/2015   Procedure: ENDARTERECTOMY CAROTID RIGHT;  Surgeon: Sherren Kerns, MD;  Location: [facility] OR;  Service: Vascular;  Laterality: Right;   PATCH ANGIOPLASTY  09/05/2015   Procedure: PATCH ANGIOPLASTY RIGHT CAROTID ARTERY USING HEMASHIELD PLATINUM FINESSE PATCH;  Surgeon: Sherren Kerns, MD;  Location: [facility] OR;  Service: Vascular;;   PERIPHERAL INTRAVASCULAR LITHOTRIPSY Left 10/28/2022   Procedure: INTRAVASCULAR LITHOTRIPSY;  Surgeon: Nada Libman, MD;  Location:  [facility];  Service: Cardiovascular;  Laterality: Left;  L peroneal   PERIPHERAL VASCULAR ATHERECTOMY Left 02/18/2022   Procedure: PERIPHERAL VASCULAR ATHERECTOMY;  Surgeon: Nada Libman, MD;  Location: [facility];  Service: Cardiovascular;  Laterality: Left;  Popliteal and Peroneal   PERIPHERAL VASCULAR BALLOON ANGIOPLASTY  02/04/2022   Procedure: PERIPHERAL VASCULAR BALLOON ANGIOPLASTY;  Surgeon: Nada Libman, MD;  Location: [facility];  Service: Cardiovascular;;   PERIPHERAL  VASCULAR BALLOON ANGIOPLASTY Right 04/15/2022   Procedure: PERIPHERAL VASCULAR BALLOON ANGIOPLASTY;  Surgeon: Nada Libman, MD;  Location: [facility];  Service: Cardiovascular;  Laterality: Right;  SFA/POPLITEAL   PERIPHERAL VASCULAR BALLOON ANGIOPLASTY Left 10/28/2022   Procedure: PERIPHERAL VASCULAR BALLOON ANGIOPLASTY;  Surgeon: Nada Libman, MD;  Location: [facility];  Service: Cardiovascular;  Laterality: Left;  L peroneal and L pop   STUMP REVISION Right 06/20/2022   Procedure: REVISION RIGHT BELOW KNEE AMPUTATION;  Surgeon: Nadara Mustard, MD;  Location: [facility] OR;  Service: Orthopedics;  Laterality: Right;   TONSILLECTOMY     VASECTOMY     Patient Active Problem List   Diagnosis Date Noted   Elevated coronary artery calcium score 08/06/2022   Dehiscence of amputation stump of right lower extremity (HCC) 06/20/2022   Acute blood loss anemia 05/19/2022   Post-op pain 05/19/2022   Acute renal failure (HCC) 05/19/2022   S/P BKA (below knee amputation), right (HCC) 05/14/2022   Below-knee amputation of right lower extremity (HCC) 05/09/2022   Gangrene of right foot (HCC)    PAD (peripheral artery disease) (HCC) 04/17/2022   Acute osteomyelitis of metatarsal bone of right foot (HCC)    Atherosclerosis of native arteries of the extremities with ulceration (HCC) 04/15/2022   Peripheral arterial disease (HCC) 03/25/2022   Obesity 03/21/2022   Prediabetes 03/21/2022   Erectile dysfunction 07/26/2021   Family history of ischemic heart disease (IHD) 07/26/2021   Pure hypercholesterolemia 07/26/2021   History of adenomatous polyp of colon 02/03/2019   Internal hemorrhoids 02/03/2019   Carotid artery stenosis, asymptomatic 09/05/2015   Cellulitis    Hypertension    Hyperlipidemia    Occlusion and stenosis of carotid artery without mention of cerebral infarction 01/01/2012    ONSET DATE: 10/15/2022 prosthesis delivery  REFERRING DIAG: Z61.096E (ICD-10-CM) - Below-knee  amputation of right lower extremity   THERAPY DIAG:  Other abnormalities of gait and mobility  Unsteadiness on feet  Muscle weakness (generalized)  Non-pressure chronic ulcer of skin of other sites with other specified severity (HCC)  Rationale for Evaluation and Treatment: Rehabilitation  SUBJECTIVE:  SUBJECTIVE STATEMENT:  He got a blister on bottom of limb ~3 weeks ago which has healed.  He went on long vacation with no issues.  He is able to do yard work without issues.    PERTINENT HISTORY: carotid artery occlusion, HTN, PVD, BLE vascular sugeries  PAIN:  Are you having pain? Limb pain 0/10 with weight bearing in last week 0/10  PRECAUTIONS: None  WEIGHT BEARING RESTRICTIONS: No  FALLS: Has patient fallen in last 6 months? No  LIVING ENVIRONMENT: Lives with: lives with their spouse Lives in: House  single level Home Access: Stairs to enter Home layout: One level Stairs: Yes: External: 3 steps; can reach both Has following equipment at home: Single point cane, Walker - 2 wheeled, Wheelchair (manual), shower chair, and knee scooter  PLOF: Independent, Independent with household mobility without device, and Independent with community mobility without  device  circulation issues started in 2019 with worsening 2021,   PATIENT GOALS:   to use prosthesis to travel, golf, climb ladder, go to gym, active in community including coaching,    OBJECTIVE:  COGNITION: Overall cognitive status: Within functional limits for tasks assessed   SENSATION: WFL  POSTURE: flexed trunk  and weight shift left  LOWER EXTREMITY ROM:  ROM P:passive  A:active Right eval  Hip flexion   Hip extension   Hip abduction   Hip adduction   Hip internal rotation   Hip external rotation   Knee flexion WFL  Knee extension WFL  Ankle dorsiflexion   Ankle plantarflexion   Ankle inversion   Ankle eversion    (Blank rows = not tested)  LOWER EXTREMITY MMT:  MMT Right eval Left eval   Hip flexion 5/5 5/5  Hip extension 4/5   Hip abduction 4/5   Hip adduction    Hip internal rotation    Hip external rotation    Knee flexion 4/5   Knee extension 4/5   Ankle dorsiflexion    Ankle plantarflexion    Ankle inversion    Ankle eversion    (Blank rows = not tested)  TRANSFERS: 11/17/2022: sit to /from stand: independent without UE assist  10/22/2022 / Eval: Sit to stand: Modified independence Stand to sit: Modified independence  GAIT: 04/09/2023: Pt neg ramp & curb without device modified independent. Pt neg 11 steps with left rail alternating pattern Gait Velocity: self-selected / comfortable: 3.75 ft/sec and fast 5.00  ft/sec Functional Gait Assessment 25/30  03/17/2023: Pt neg ramp & curb without device modified independent. Pt neg 11 steps with left rail alternating pattern with minor cues on foot position descending.  Gait Velocity: self-selected / comfortable: 3.72 ft/sec and fast 4.72  ft/sec Functional Gait Assessment 20/30  [facility] PT Assessment - 04/08/23 0800       Four Square Step Test    Trial One  11.11    Trial Two 10.01      Functional Gait  Assessment   Gait assessed  Yes    Gait Level Surface Walks 20 ft in less than 5.5 sec, no assistive devices, good speed, no evidence for imbalance, normal gait pattern, deviates no more than 6 in outside of the 12 in walkway width.    Change in Gait Speed Able to smoothly change walking speed without loss of balance or gait deviation. Deviate no more than 6 in outside of the 12 in walkway width.    Gait with Horizontal Head Turns Performs head turns smoothly with no change in gait. Deviates no more than 6 in outside 12 in walkway width    Gait with Vertical Head Turns Performs head turns with no change in gait. Deviates no more than 6 in outside 12 in walkway width.    Gait and Pivot Turn Pivot turns safely within 3 sec and stops quickly with no loss of balance.    Step Over Obstacle Is able to step over 2  stacked shoe boxes taped together (9 in total height) without changing gait speed. No evidence of imbalance.    Gait with Narrow Base of Support Ambulates 4-7 steps.    Gait with Eyes Closed Walks 20 ft, uses assistive device, slower speed, mild gait deviations, deviates 6-10 in outside 12 in walkway width. Ambulates 20 ft in less than 9 sec but greater than 7 sec.    Ambulating Backwards Walks 20 ft, uses assistive device, slower speed,  mild gait deviations, deviates 6-10 in outside 12 in walkway width.    Steps Alternating feet, must use rail.    Total Score 25               01/13/2023: Gait Velocity: no device comfortable / self-selected pace 3.66 ft/sec and fast pace 4.60 ft/sec Pt amb 500' without device with cues only.  Pt neg ramp & curb without device with supervision / guarded motions. Pt neg stairs alternating pattern with single rail with cues for technique.    11/17/2022:  Pt amb community distances with cane including neg ramps & curbs safely.  Pt amb up to 200' (residual limb pain limits distances) without device safely with verbal cues only to correct deviations. Gait Velocity: self-selected 2.13 ft/sec & fast pace 2.67 ft/sec Functional Gait Assessment: 10/30  10/22/2022 / Eval: Gait pattern: step to pattern, decreased step length- Left, decreased stance time- Right, decreased hip/knee flexion- Right, Right hip hike, knee flexed in stance- Right, antalgic, trunk flexed, and abducted- Right Distance walked: 50' Assistive device utilized: Environmental consultant - 2 wheeled and TTA prosthesis Level of assistance: SBA / cues for deviations no balance issues noted with RW Comments: due to wound issues PT recommending RW for BUE support initially  FUNCTIONAL TESTs:  04/08/2023:  [facility] PT Assessment - 04/08/23 0800       Four Square Step Test    Trial One  11.11    Trial Two 10.01      Functional Gait  Assessment   Gait assessed  Yes    Gait Level Surface Walks 20 ft in less than 5.5  sec, no assistive devices, good speed, no evidence for imbalance, normal gait pattern, deviates no more than 6 in outside of the 12 in walkway width.    Change in Gait Speed Able to smoothly change walking speed without loss of balance or gait deviation. Deviate no more than 6 in outside of the 12 in walkway width.    Gait with Horizontal Head Turns Performs head turns smoothly with no change in gait. Deviates no more than 6 in outside 12 in walkway width    Gait with Vertical Head Turns Performs head turns with no change in gait. Deviates no more than 6 in outside 12 in walkway width.    Gait and Pivot Turn Pivot turns safely within 3 sec and stops quickly with no loss of balance.    Step Over Obstacle Is able to step over 2 stacked shoe boxes taped together (9 in total height) without changing gait speed. No evidence of imbalance.    Gait with Narrow Base of Support Ambulates 4-7 steps.    Gait with Eyes Closed Walks 20 ft, uses assistive device, slower speed, mild gait deviations, deviates 6-10 in outside 12 in walkway width. Ambulates 20 ft in less than 9 sec but greater than 7 sec.    Ambulating Backwards Walks 20 ft, uses assistive device, slower speed, mild gait deviations, deviates 6-10 in outside 12 in walkway width.    Steps Alternating feet, must use rail.    Total Score 25              01/13/2023: Timed Up & Go without device except prosthesis: standard 8.94 sec and cognitive 8.94 sec Four Square Step Test 14.18 & 13.50 sec both indicating fall risk Functional Gait Assessment 16/30 without device. Indicates high fall risk.   [facility] PT Assessment - 04/08/23 0800       Four Square  Step Test    Trial One  11.11    Trial Two 10.01      Functional Gait  Assessment   Gait assessed  Yes    Gait Level Surface Walks 20 ft in less than 5.5 sec, no assistive devices, good speed, no evidence for imbalance, normal gait pattern, deviates no more than 6 in outside of the 12 in walkway width.     Change in Gait Speed Able to smoothly change walking speed without loss of balance or gait deviation. Deviate no more than 6 in outside of the 12 in walkway width.    Gait with Horizontal Head Turns Performs head turns smoothly with no change in gait. Deviates no more than 6 in outside 12 in walkway width    Gait with Vertical Head Turns Performs head turns with no change in gait. Deviates no more than 6 in outside 12 in walkway width.    Gait and Pivot Turn Pivot turns safely within 3 sec and stops quickly with no loss of balance.    Step Over Obstacle Is able to step over 2 stacked shoe boxes taped together (9 in total height) without changing gait speed. No evidence of imbalance.    Gait with Narrow Base of Support Ambulates 4-7 steps.    Gait with Eyes Closed Walks 20 ft, uses assistive device, slower speed, mild gait deviations, deviates 6-10 in outside 12 in walkway width. Ambulates 20 ft in less than 9 sec but greater than 7 sec.    Ambulating Backwards Walks 20 ft, uses assistive device, slower speed, mild gait deviations, deviates 6-10 in outside 12 in walkway width.    Steps Alternating feet, must use rail.    Total Score 25               11/17/2022: Berg Balance 50/56 & Functional Gait Assessment 10/30  Eval / 10/22/2022:  Sharlene Motts Balance Scale: 36/56  CURRENT PROSTHETIC WEAR ASSESSMENT:  04/09/2023: He is wearing prosthesis all awake hours without skin or pain issues. He is independent in prosthetic care.    01/13/2023: patient tolerates prosthesis wear >90% of awake hours. He has wound with scab at distal tibia present which is healing still.  He has limb pain 0-4/10 with prosthesis wear & weight bearing but significant improvement.  Pt is independent with: skin check, residual limb care, prosthetic cleaning, ply sock cleaning, correct ply sock adjustment, proper wear schedule/adjustment, and proper weight-bearing schedule/adjustment   11/17/2022: Pt wearing prosthesis most of  awake hours except bathing & last 1-1.5 hours of day. Wound scab at distal tibia. PT continues to advise / problem-solve limb pain & other prosthetic issues.    Eval / 10/22/2022: Patient is dependent with: skin check, residual limb care, prosthetic cleaning, ply sock cleaning, correct ply sock adjustment, proper wear schedule/adjustment, and proper weight-bearing schedule/adjustment Donning prosthesis: SBA Doffing prosthesis: Modified independence Prosthetic wear tolerance: 1-1.5 hours, 2x/day, for last 2 days.  He wore prosthesis 1hr 2x on 3/8 and got water blisters.  Stopped wear on 3/9 & 3/10, resumed 3/11 1.5 hrs 2x/day for 2 days.  Prosthetic weight bearing tolerance: 5 minutes Edema: pitting Residual limb condition: draining (serous) wound at distal tibia, 2nd open blister on distal limb, minimal hair growth, cylindrical shape, no warmth or abnormal color changes noted.  Prosthetic description: silicon liner with pin lock suspension, total contact socket with flexible inner liner, 0-ply socks, dynamic response foot K code/activity level with prosthetic use: Level 3  TODAY'S TREATMENT:                                                                                                                             DATE:  04/08/2023: See objective data  03/17/2023: Prosthetic Training with TTA prosthesis: PT instructed in adjusting position of prosthesis to sit with equal weight bearing. Pt verbalized understanding and reports feels better. PT demo & verbal cues on standing with equal weight bearing and touching counter with RUE lightly when able to facilitate weight to right. Pt verbalized and return demo understanding.  PT demo & verbal cues on turning from stationary position and turning 90* around table. Pt return demo and improved with repetition.   PT used internet to show pt a rotator unit which could improve sitting in confined spaces. Pt verbalized understanding.  PT instructed pt in  actual vs perceived weight with changes anticipated with more intimate fitting socket. PT instructed in residual limb pain, phantom sensation vs phantom pain. Pt verbalized understanding.     02/17/2023: Prosthetic Training with TTA prosthesis: PT educated in Positioning seated so weight of prosthesis is taken by floor. Pt verbalized understanding.  PT assessed pelvis height in standing equal weight bearing and prosthesis appears proper height.  PT recommended using Adjust-a-Lift in left shoe and progressively decreasing until removes. This will allow perception that prosthesis is too high until he can accommodate to height. Pt verbalized understanding. Continue to do HEP for ambulation to improve dynamic balance. Pt verbalized understanding.  Neuromuscular Re-education: Standing with equal weight bearing LEs with UE resistance blue T-band alternating UEs & BUEs 10 reps each for row, forward reach & biceps curl. Added to HEP with verbal, demo & HO cues. Pt verbalized & return demo understanding.  Standing T-band green hip abd, ext, add & SLR flex 10 reps ea BLEs with light chair back support. Added to HEP with verbal, demo & HO cues. Pt verbalized & return demo understanding.     HOME EXERCISE PROGRAM: Access Code: [email or web address] URL: [email or web address] Date: 02/17/2023 Prepared by: Vladimir Faster  Exercises - Wide stance on Foam Pad head movements  - 1 x daily - 4 x weekly - 1 sets - 5-10 reps - Alternating Punch with Resistance  - 1 x daily - 3-4 x weekly - 1 sets - 10 reps - 5 seconds hold - Standing Scapular Protraction with Resistance  - 1 x daily - 3-4 x weekly - 1 sets - 10 reps - 5 seconds hold - Standing alternate rows with resistance  - 1 x daily - 3-4 x weekly - 1 sets - 10 reps - 5 seconds hold - Standing Row with Anchored Resistance  - 1 x daily - 3-4 x weekly - 1 sets - 10 reps - 5 seconds hold - Alternating elbow flexion with resistance  - 1 x daily - 3-4 x weekly -  1 sets - 10 reps - 5 seconds hold - Standing Bicep Curls  with Resistance  - 1 x daily - 3-4 x weekly - 1 sets - 10 reps - 5 seconds hold - Standing Hip Flexion with Resistance (Mirrored)  - 1 x daily - 3-4 x weekly - 1 sets - 10 reps - Standing Hip Adduction with Resistance (Mirrored)  - 1 x daily - 3-4 x weekly - 1 sets - 10 reps - Standing Hip Extension with Resistance  - 1 x daily - 3-4 x weekly - 1 sets - 10 reps - Standing Hip Abduction with Theraband Resistance  - 1 x daily - 3-4 x weekly - 1 sets - 10 reps  HEP 11/04/2022 with PT demo & verbal cues. Pt & wife verbalized understanding with Pt return demo.  1.  Sidestepping right and left along counter with cues on engaging bilateral lower extremities. 10' 3 laps.  2.  Counter RUE and cane LUE -walking forward with equal step length using heel toe as marker and walking backwards with weight shift over the prosthesis in stance. 10' 3 laps.   3.  Weight shift over RLE in stance with tightening hip muscles for stabilization tapping left forefoot in lower cabinet. 10 reps 2 sets.  4. Sit to / from stand 18" chair without armrest using upper extremities lightly on seat.  PT demo and verbal cues on technique to decrease upper extremity support and increased lower extremity utilization.  10 reps 2 sets.  5.  Picking up washcloth from floor. PT demo & verbal cues on technique with TTA prosthesis and set-up for safety.  10 reps 2 sets.  stepping over obstacles - lead with prosthesis to see it clear. curb leading with either LE - try to maintain momentum scanning during gait _ both lines of T & X goal is to maintain path & pace alternating 3 steps eyes open & eyes closed picking up pace  stairs alternating pattern 4 square stepping clockwise & counterclockwise  ASSESSMENT: CLINICAL IMPRESSION: Patient mett all LTGs.  He appears to be safely functioning with his prosthesis at full community level.  All objective / functional outcome tests indicate  low fall risk.    OBJECTIVE IMPAIRMENTS: Abnormal gait, decreased activity tolerance, decreased balance, decreased knowledge of condition, decreased knowledge of use of DME, decreased mobility, difficulty walking, decreased strength, increased edema, prosthetic dependency , and wound on residual limb .   ACTIVITY LIMITATIONS: carrying, lifting, bending, standing, squatting, stairs, transfers, locomotion level, and prosthesis use  PARTICIPATION LIMITATIONS: meal prep, cleaning, driving, community activity, occupation, yard work, and recreational activities.  PERSONAL FACTORS: Fitness, Time since onset of injury/illness/exacerbation, and 3+ comorbidities: see PMH  are also affecting patient's functional outcome.   REHAB POTENTIAL: Good  CLINICAL DECISION MAKING: Evolving/moderate complexity  EVALUATION COMPLEXITY: Moderate   GOALS: Goals reviewed with patient? Yes  SHORT TERM GOALS: Target date: visit 18  Patient able to negotiate ramp & curb without device safely.  Baseline: SEE OBJECTIVE DATA Goal status: MET 03/17/2023 2.  Patient tolerates prosthesis >90% awake hours /day and weight bearing activities throughout day with limb pain </=2/10. Baseline: SEE OBJECTIVE DATA Goal status: MET 03/17/2023  3.  Functional Gait Assessment >/=19/30 Baseline: SEE OBJECTIVE DATA Goal status: MET 03/17/2023  4. Patient negotiates stairs with single rail alternating pattern modified independent.  Baseline: SEE OBJECTIVE DATA Goal status: partially MET 03/17/2023   UPDATED LONG TERM GOALS: Target date: 04/09/2023   Patient tolerates prosthesis wear >90% of awake hours without skin or limb pain issues. Baseline: SEE OBJECTIVE DATA  Goal status: MET 04/08/2023  2.  Functional Gait Assessment >/= 22/30 to indicate lower fall risk Baseline: SEE OBJECTIVE DATA  Goal status: MET 04/08/2023  3.  Patient ambulates >1000' with prosthesis only independently Baseline: SEE OBJECTIVE DATA Goal status: MET  04/08/2023  4.  Patient negotiates ramps, curbs & stairs with single rail with prosthesis only independently. Baseline: SEE OBJECTIVE DATA Goal status: MET 04/08/2023  5.  Patient demonstrates & verbalizes ability to perform yard work & golf with prosthesis only safely. Baseline: SEE OBJECTIVE DATA Goal status: MET 04/08/2023  6.  Four Square Step Test <10 sec safely to indicate lower fall risk.  Baseline: SEE OBJECTIVE DATA Goal status:  MET 04/08/2023  PLAN:  PT FREQUENCY: 1x every 1-2 weeks  PT DURATION: 5 additional visits  PLANNED INTERVENTIONS: Therapeutic exercises, Therapeutic activity, Neuromuscular re-education, Balance training, Gait training, Patient/Family education, Self Care, Stair training, Prosthetic training, Re-evaluation, and physical performance testing.  PLAN FOR NEXT SESSION: discharge PT.   Vladimir Faster, PT, DPT 04/08/2023, 8:25 AM

## 2023-04-20 ENCOUNTER — Ambulatory Visit (INDEPENDENT_AMBULATORY_CARE_PROVIDER_SITE_OTHER): Payer: 59 | Admitting: Orthopedic Surgery

## 2023-04-20 ENCOUNTER — Encounter: Payer: Self-pay | Admitting: Orthopedic Surgery

## 2023-04-20 DIAGNOSIS — S88111A Complete traumatic amputation at level between knee and ankle, right lower leg, initial encounter: Secondary | ICD-10-CM

## 2023-04-20 DIAGNOSIS — Z89511 Acquired absence of right leg below knee: Secondary | ICD-10-CM | POA: Diagnosis not present

## 2023-04-20 DIAGNOSIS — T8781 Dehiscence of amputation stump: Secondary | ICD-10-CM

## 2023-04-20 NOTE — Progress Notes (Signed)
Office Visit Note   Patient: Clinton [name]           Date of Birth: May 23, 1956           MRN: [id number] Visit Date: 04/20/2023              Requested by: Darrow Bussing, MD [address] [place],  [place] [address] PCP: Darrow Bussing, MD  Chief Complaint  Patient presents with   Right Leg - Follow-up    Hx Right BKA 2023      HPI: Patient is a 67 year old gentleman who is seen in follow-up nonbearing ulceration right residual limb secondary to subsiding.  His socket with loss of residual volume.  Assessment & Plan: Visit Diagnoses:  1. Below-knee amputation of right lower extremity, initial encounter (HCC)   2. Dehiscence of amputation stump (HCC)     Plan: Patient is provided a prescription for [employer] for a new socket liner materials and supplies.  He will continue to wear the Vive wear prosthetic under liner.  Recommended Mycostatin for the rash on the right thigh.  Follow-Up Instructions: Return if symptoms worsen or fail to improve.   Ortho Exam  Patient is alert, oriented, no adenopathy, well-dressed, normal affect, normal respiratory effort. Examination patient has a fungal rash proximal to the Vive wear a prosthetic under liner.  The rash is stable.  The ulcer on the end of the residual limb secondary to subsiding in the socket has decreased callus there is improved granulation tissue there is no cellulitis.  The wound is approximately a centimeter in diameter with healthy granulation tissue.  Patient will need a new socket for further healing and to prevent further ulceration.  Patient is an existing right transtibial  amputee.  Patient's current comorbidities are not expected to impact the ability to function with the prescribed prosthesis. Patient verbally communicates a strong desire to use a prosthesis. Patient currently requires mobility aids to ambulate without a prosthesis.  Expects not to use mobility aids with a new  prosthesis.  Patient is a K3 level ambulator that spends a lot of time walking around on uneven terrain over obstacles, up and down stairs, and ambulates with a variable cadence.     Imaging: No results found. No images are attached to the encounter.  Labs: Lab Results  Component Value Date   HGBA1C 5.4 04/15/2022   ESRSEDRATE 14 04/08/2022   CRP 2.1 04/08/2022   REPTSTATUS 06/25/2022 FINAL 06/20/2022   GRAMSTAIN  06/20/2022    ABUNDANT WBC PRESENT,BOTH PMN AND MONONUCLEAR NO ORGANISMS SEEN    CULT  06/20/2022    RARE HAEMOPHILUS PARAINFLUENZAE BETA LACTAMASE NEGATIVE FEW GEMELLA MORBILLORUM Standardized susceptibility testing for this organism is not available. Performed at [facility], [address], [place], [place] [address]      Lab Results  Component Value Date   ALBUMIN 3.6 06/20/2022   ALBUMIN 2.3 (L) 05/15/2022   ALBUMIN 3.9 08/27/2015    No results found for: "MG" No results found for: "VD25OH"  No results found for: "PREALBUMIN"    Latest Ref Rng & Units 10/28/2022   10:05 AM 06/20/2022   11:45 AM 05/15/2022    5:14 AM  CBC EXTENDED  WBC 4.0 - 10.5 K/uL  6.3  5.6   RBC 4.22 - 5.81 MIL/uL  3.85  2.55   Hemoglobin 13.0 - 17.0 g/dL 82.9  56.2  8.1   HCT 13.0 - 52.0 % 38.0  36.3  24.2   Platelets 150 - 400 K/uL  231  218   NEUT# 1.7 - 7.7 K/uL  4.2  3.6   Lymph# 0.7 - 4.0 K/uL  1.5  1.3      There is no height or weight on file to calculate BMI.  Orders:  No orders of the defined types were placed in this encounter.  No orders of the defined types were placed in this encounter.    Procedures: No procedures performed  Clinical Data: No additional findings.  ROS:  All other systems negative, except as noted in the HPI. Review of Systems  Objective: Vital Signs: There were no vitals taken for this visit.  Specialty Comments:  No specialty comments available.  PMFS History: Patient Active Problem List   Diagnosis Date  Noted   Elevated coronary artery calcium score 08/06/2022   Dehiscence of amputation stump of right lower extremity (HCC) 06/20/2022   Acute blood loss anemia 05/19/2022   Post-op pain 05/19/2022   Acute renal failure (HCC) 05/19/2022   S/P BKA (below knee amputation), right (HCC) 05/14/2022   Below-knee amputation of right lower extremity (HCC) 05/09/2022   Gangrene of right foot (HCC)    PAD (peripheral artery disease) (HCC) 04/17/2022   Acute osteomyelitis of metatarsal bone of right foot (HCC)    Atherosclerosis of native arteries of the extremities with ulceration (HCC) 04/15/2022   Peripheral arterial disease (HCC) 03/25/2022   Obesity 03/21/2022   Prediabetes 03/21/2022   Erectile dysfunction 07/26/2021   Family history of ischemic heart disease (IHD) 07/26/2021   Pure hypercholesterolemia 07/26/2021   History of adenomatous polyp of colon 02/03/2019   Internal hemorrhoids 02/03/2019   Carotid artery stenosis, asymptomatic 09/05/2015   Cellulitis    Hypertension    Hyperlipidemia    Occlusion and stenosis of carotid artery without mention of cerebral infarction 01/01/2012   Past Medical History:  Diagnosis Date   Carotid artery occlusion    Cellulitis    Erectile dysfunction    Hyperlipidemia    Hypertension    Peripheral vascular disease (HCC)     Family History  Problem Relation Age of Onset   Other Mother        circulatory problems   Heart attack Mother    Deep vein thrombosis Mother    Heart disease Mother    Hyperlipidemia Father    Hypertension Father    Diabetes Father    Deep vein thrombosis Father    Heart disease Father        before age 76   Heart attack Father    Peripheral vascular disease Father    Hypertension Brother    Hyperlipidemia Brother     Past Surgical History:  Procedure Laterality Date   ABDOMINAL AORTOGRAM W/LOWER EXTREMITY N/A 02/04/2022   Procedure: ABDOMINAL AORTOGRAM W/LOWER EXTREMITY;  Surgeon: Nada Libman, MD;   Location: [facility];  Service: Cardiovascular;  Laterality: N/A;   ABDOMINAL AORTOGRAM W/LOWER EXTREMITY N/A 02/18/2022   Procedure: ABDOMINAL AORTOGRAM W/LOWER EXTREMITY;  Surgeon: Nada Libman, MD;  Location: [facility];  Service: Cardiovascular;  Laterality: N/A;   ABDOMINAL AORTOGRAM W/LOWER EXTREMITY N/A 04/15/2022   Procedure: ABDOMINAL AORTOGRAM W/LOWER EXTREMITY;  Surgeon: Nada Libman, MD;  Location: [facility];  Service: Cardiovascular;  Laterality: N/A;   ABDOMINAL AORTOGRAM W/LOWER EXTREMITY N/A 10/28/2022   Procedure: ABDOMINAL AORTOGRAM W/LOWER EXTREMITY;  Surgeon: Nada Libman, MD;  Location: [facility];  Service:  Cardiovascular;  Laterality: N/A;   AMPUTATION Right 04/16/2022   Procedure: RIGHT 5TH RAY AMPUTATION;  Surgeon: Nadara Mustard, MD;  Location: [facility] OR;  Service: Orthopedics;  Laterality: Right;   AMPUTATION Right 05/09/2022   Procedure: RIGHT BELOW KNEE AMPUTATION;  Surgeon: Nadara Mustard, MD;  Location: [facility] OR;  Service: Orthopedics;  Laterality: Right;   APPLICATION OF WOUND VAC Right 06/20/2022   Procedure: APPLICATION OF WOUND VAC;  Surgeon: Nadara Mustard, MD;  Location: [facility];  Service: Orthopedics;  Laterality: Right;   COLONOSCOPY     ENDARTERECTOMY Right 09/05/2015   Procedure: ENDARTERECTOMY CAROTID RIGHT;  Surgeon: Sherren Kerns, MD;  Location: [facility] OR;  Service: Vascular;  Laterality: Right;   PATCH ANGIOPLASTY  09/05/2015   Procedure: PATCH ANGIOPLASTY RIGHT CAROTID ARTERY USING HEMASHIELD PLATINUM FINESSE PATCH;  Surgeon: Sherren Kerns, MD;  Location: [facility] OR;  Service: Vascular;;   PERIPHERAL INTRAVASCULAR LITHOTRIPSY Left 10/28/2022   Procedure: INTRAVASCULAR LITHOTRIPSY;  Surgeon: Nada Libman, MD;  Location: [facility];  Service: Cardiovascular;  Laterality: Left;  L peroneal   PERIPHERAL VASCULAR ATHERECTOMY Left 02/18/2022   Procedure: PERIPHERAL VASCULAR ATHERECTOMY;  Surgeon: Nada Libman, MD;   Location: [facility];  Service: Cardiovascular;  Laterality: Left;  Popliteal and Peroneal   PERIPHERAL VASCULAR BALLOON ANGIOPLASTY  02/04/2022   Procedure: PERIPHERAL VASCULAR BALLOON ANGIOPLASTY;  Surgeon: Nada Libman, MD;  Location: [facility];  Service: Cardiovascular;;   PERIPHERAL VASCULAR BALLOON ANGIOPLASTY Right 04/15/2022   Procedure: PERIPHERAL VASCULAR BALLOON ANGIOPLASTY;  Surgeon: Nada Libman, MD;  Location: [facility];  Service: Cardiovascular;  Laterality: Right;  SFA/POPLITEAL   PERIPHERAL VASCULAR BALLOON ANGIOPLASTY Left 10/28/2022   Procedure: PERIPHERAL VASCULAR BALLOON ANGIOPLASTY;  Surgeon: Nada Libman, MD;  Location: [facility];  Service: Cardiovascular;  Laterality: Left;  L peroneal and L pop   STUMP REVISION Right 06/20/2022   Procedure: REVISION RIGHT BELOW KNEE AMPUTATION;  Surgeon: Nadara Mustard, MD;  Location: [facility] OR;  Service: Orthopedics;  Laterality: Right;   TONSILLECTOMY     VASECTOMY     Social History   Occupational History   Not on file  Tobacco Use   Smoking status: Never   Smokeless tobacco: Former    Types: Chew    Quit date: 01/01/2015  Vaping Use   Vaping status: Never Used  Substance and Sexual Activity   Alcohol use: Not Currently   Drug use: Not on file   Sexual activity: Not on file

## 2023-05-20 ENCOUNTER — Ambulatory Visit (INDEPENDENT_AMBULATORY_CARE_PROVIDER_SITE_OTHER)
Admission: RE | Admit: 2023-05-20 | Discharge: 2023-05-20 | Discharge status: Home / Self Care | Payer: 59 | Source: Ambulatory Visit | Attending: Vascular Surgery

## 2023-05-20 ENCOUNTER — Ambulatory Visit (HOSPITAL_COMMUNITY)
Admission: RE | Admit: 2023-05-20 | Discharge: 2023-05-20 | Disposition: A | Discharge status: Home / Self Care | Payer: 59 | Source: Ambulatory Visit | Attending: Vascular Surgery | Admitting: Vascular Surgery

## 2023-05-20 DIAGNOSIS — I739 Peripheral vascular disease, unspecified: Secondary | ICD-10-CM | POA: Diagnosis present

## 2023-05-20 LAB — VAS US ABI WITH/WO TBI

## 2023-05-25 ENCOUNTER — Ambulatory Visit: Payer: 59 | Admitting: Surgery

## 2023-05-31 NOTE — Progress Notes (Unsigned)
[facility] [place]  Patient name: Clinton [name] MRN: [id number] DOB: 11-26-1955 Sex: male   REASON FOR VISIT:    Follow-up  HISOTRY OF PRESENT ILLNESS:    Clinton [name] is a 67 y.o. male who has undergone the following procedures:     09/05/2015: Right carotid endarterectomy (asymptomatic, [name]) 02/04/2022: DCB, right popliteal artery, PTA right anterior tibial artery (ulcers, [name]) 02/18/2022: DCB, left popliteal artery, PTA, left peroneal artery, atherectomy, left popliteal and peroneal artery (ulcers, Shneur Whitten[place]) 04/15/2022: Angioplasty, right popliteal artery, angioplasty, right anterior tibial artery (ulcer, Chatara Lucente) 04/16/2022: Right fifth toe amputation [name]) 05/09/2022: Right below-knee amputation [name]) 06/20/2022: Revision right below-knee amputation [name]) 10/28/2022: Left below-knee popliteal artery shockwave lithotripsy and drug-coated balloon angioplasty.  Posterior tibial angioplasty (ulcer)     He is now walking with a prosthesis.  There are no wounds on his left foot.  He is on dual antiplatelet therapy and a statin.  His most recent ultrasound showed no significant stenosis.  He is back today for surveillance imaging   PAST MEDICAL HISTORY:   Past Medical History:  Diagnosis Date   Carotid artery occlusion    Cellulitis    Erectile dysfunction    Hyperlipidemia    Hypertension    Peripheral vascular disease (HCC)      FAMILY HISTORY:   Family History  Problem Relation Age of Onset   Other Mother        circulatory problems   Heart attack Mother    Deep vein thrombosis Mother    Heart disease Mother    Hyperlipidemia Father    Hypertension Father    Diabetes Father    Deep vein thrombosis Father    Heart disease Father        before age 55   Heart attack Father    Peripheral vascular disease Father    Hypertension Brother    Hyperlipidemia Brother     SOCIAL HISTORY:    Social History   Tobacco Use   Smoking status: Never   Smokeless tobacco: Former    Types: Chew    Quit date: 01/01/2015  Substance Use Topics   Alcohol use: Not Currently     ALLERGIES:   Allergies  Allergen Reactions   Penicillins Rash    Has patient had a PCN reaction causing immediate rash, facial/tongue/throat swelling, SOB or lightheadedness with hypotension: Yes Has patient had a PCN reaction causing severe rash involving mucus membranes or skin necrosis: No Has patient had a PCN reaction that required hospitalization No Has patient had a PCN reaction occurring within the last 10 years: No If all of the above answers are "NO", then may proceed with Cephalosporin use.  Tolerated Cefazolin on 04/16/22    Sulfa Antibiotics Swelling and Other (See Comments)    Facial/lip swelling     CURRENT MEDICATIONS:   Current Outpatient Medications  Medication Sig Dispense Refill   amLODipine (NORVASC) 2.5 MG tablet Take 2.5 mg by mouth at bedtime.     ascorbic acid (VITAMIN C) 500 MG tablet Take 500 mg by mouth daily.     aspirin EC 81 MG tablet Take 81 mg by mouth at bedtime.     atorvastatin (LIPITOR) 80 MG tablet Take 80 mg by mouth at bedtime.     clopidogrel (PLAVIX) 75 MG tablet Take 1 tablet (75 mg total) by mouth daily. 30 tablet 11   irbesartan-hydrochlorothiazide (AVALIDE) 300-12.5 MG tablet Take 1 tablet by mouth at  bedtime.     Multiple Vitamin (MULTIVITAMIN) capsule Take 2 capsules by mouth at bedtime.     Omega-3 Fatty Acids (OMEGA 3 PO) Take 3 capsules by mouth daily. 454 mg each     Probiotic Product (PROBIOTIC PO) Take 1 capsule by mouth daily.     zinc sulfate 220 (50 Zn) MG capsule Take 1 capsule (220 mg total) by mouth daily.     No current facility-administered medications for this visit.    REVIEW OF SYSTEMS:   [X]  denotes positive finding, [ ]  denotes negative finding Cardiac  Comments:  Chest pain or chest pressure: ***   Shortness of breath upon  exertion:    Short of breath when lying flat:    Irregular heart rhythm:        Vascular    Pain in calf, thigh, or hip brought on by ambulation:    Pain in feet at night that wakes you up from your sleep:     Blood clot in your veins:    Leg swelling:         Pulmonary    Oxygen at home:    Productive cough:     Wheezing:         Neurologic    Sudden weakness in arms or legs:     Sudden numbness in arms or legs:     Sudden onset of difficulty speaking or slurred speech:    Temporary loss of vision in one eye:     Problems with dizziness:         Gastrointestinal    Blood in stool:     Vomited blood:         Genitourinary    Burning when urinating:     Blood in urine:        Psychiatric    Major depression:         Hematologic    Bleeding problems:    Problems with blood clotting too easily:        Skin    Rashes or ulcers:        Constitutional    Fever or chills:      PHYSICAL EXAM:   There were no vitals filed for this visit.  GENERAL: The patient is a well-nourished male, in no acute distress. The vital signs are documented above. CARDIAC: There is a regular rate and rhythm.  VASCULAR: *** PULMONARY: Non-labored respirations ABDOMEN: Soft and non-tender with normal pitched bowel sounds.  MUSCULOSKELETAL: There are no major deformities or cyanosis. NEUROLOGIC: No focal weakness or paresthesias are detected. SKIN: There are no ulcers or rashes noted. PSYCHIATRIC: The patient has a normal affect.  STUDIES:   ***  MEDICAL ISSUES:   ***  Carotid: Will schedule carotid duplex surveillance in 9 months  Charlena Cross, MD, FACS [facility] [phone] Pager [phone]

## 2023-06-01 ENCOUNTER — Ambulatory Visit (INDEPENDENT_AMBULATORY_CARE_PROVIDER_SITE_OTHER): Payer: 59 | Admitting: Surgery

## 2023-06-01 ENCOUNTER — Encounter: Payer: Self-pay | Admitting: Surgery

## 2023-06-01 VITALS — BP 172/69 | HR 62 | Temp 98.0°F | Ht 70.0 in | Wt 207.0 lb

## 2023-06-01 DIAGNOSIS — I7025 Atherosclerosis of native arteries of other extremities with ulceration: Secondary | ICD-10-CM | POA: Diagnosis not present

## 2023-06-08 ENCOUNTER — Telehealth: Payer: Self-pay | Admitting: Cardiovascular Disease

## 2023-06-08 NOTE — Telephone Encounter (Signed)
Spoke to patient who report chest pain/discomfort that comes and goes for about 2 weeks lasting about 5-10 minutes. He deny SOB, dizziness, fatigue or feeling like he is going to pass out. He does report headache and jaw pain over the last 2 days. He denies pain at this time.Appointment scheduled for 10/29 with [place], [place]. Advised patient if worse before appt the he should go to the ED. Patient verbalized understanding and agree.

## 2023-06-08 NOTE — Telephone Encounter (Signed)
Pt c/o of Chest Pain: STAT if CP now or developed within 24 hours  1. Are you having CP right now? No  2. Are you experiencing any other symptoms (ex. SOB, nausea, vomiting, sweating)? No  3. How long have you been experiencing CP? Couple of weeks   4. Is your CP continuous or coming and going? Coming and going   5. Have you taken Nitroglycerin? No  ?

## 2023-06-09 ENCOUNTER — Ambulatory Visit: Payer: 59 | Attending: Physician Assistant | Admitting: Physician Assistant

## 2023-06-09 ENCOUNTER — Other Ambulatory Visit: Payer: Self-pay | Admitting: Physician Assistant

## 2023-06-09 VITALS — BP 106/66 | HR 70 | Ht 70.0 in | Wt 206.0 lb

## 2023-06-09 DIAGNOSIS — I200 Unstable angina: Secondary | ICD-10-CM | POA: Diagnosis not present

## 2023-06-09 DIAGNOSIS — I10 Essential (primary) hypertension: Secondary | ICD-10-CM | POA: Diagnosis not present

## 2023-06-09 DIAGNOSIS — I739 Peripheral vascular disease, unspecified: Secondary | ICD-10-CM

## 2023-06-09 DIAGNOSIS — E785 Hyperlipidemia, unspecified: Secondary | ICD-10-CM | POA: Diagnosis not present

## 2023-06-09 DIAGNOSIS — I6521 Occlusion and stenosis of right carotid artery: Secondary | ICD-10-CM

## 2023-06-09 MED ORDER — NITROGLYCERIN 0.4 MG SL SUBL: 0.4000 mg | SUBLINGUAL_TABLET | SUBLINGUAL | 3 refills | Status: DC | PRN

## 2023-06-09 NOTE — H&P (View-Only) (Signed)
Cardiology Office Note:  .   Date:  06/09/2023  ID:  Clinton [name], DOB May 28, 1956, MRN [id number] PCP: Darrow Bussing, MD  [facility] Providers Cardiologist:  Nanetta Batty, MD     History of Present Illness: [name] Kitchen   Clinton [name] is a 67 y.o. male with PMH of hypertension, hyperlipidemia, PAD and carotid artery disease s/p R CEA on 09/05/2015.  He has significant family history of heart issue with his father died of MI at the age of 58, mother at age 15, younger brother had CABG.  He himself has never had a heart attack or stroke.  He has been followed by [name] for critical limb ischemia and had endovascular therapy for his right popliteal artery, left popliteal artery and the peroneal artery.  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  He had a calcium scoring test on 07/01/2022 which showed calcium score of 1756 majority of which is in the LAD and RCA territory, he was completely asymptomatic.  Subsequent Myoview obtained on 07/18/2022 was normal.  Echocardiogram obtained on 07/09/2022 shows EF 60 to 65%, mild LVH of the basal septal segment, grade 1 DD.  Patient underwent lower extremity angiography on 10/28/2022 which showed widely patent left common femoral and profundofemoral artery, heavily calcified but patent left SFA.  Below-knee popliteal artery is heavily calcified and diffusely diseased was 80 to 90% stenosis in the distal popliteal artery at the level of the anterior tibial artery.  This was treated with with drug-coated balloon angioplasty.  He was last seen by [name] on 06/01/2023 at which time he was doing well.  Patient presents today accompanied by his wife, in the past several weeks, he has been having intermittent chest tightness.  Chest tightness primarily occurs with physical activity.  It usually last about 5 to 10 minutes.  Chest tightness is not exacerbated by deep inspiration, body rotation or palpation.   Because his prior history of right below-knee amputation, he does not do much strenuous activity.  EKG showed normal sinus rhythm, chronic right bundle branch block, minimal ST depression in V3-V5.  His case was reviewed with DOD Dr. Jacques Navy, given very high pretest probability of underlying coronary artery disease, we recommend proceeding directly to cardiac catheterization.  Of note, his son was scheduled to undergo valve repair today however tested positive for COVID.  Therefore his sons valve surgery has been moved to next Thursday.  Risk and the benefit of cardiac authorization has been explained to the patient and the wife, he is agreeable to proceed.  ROS:   Patient complains of chest pain, denies any significant shortness of breath.  He has no lower extremity edema, orthopnea or PND.  Studies Reviewed: .        Cardiac Studies & Procedures     STRESS TESTS  MYOCARDIAL PERFUSION IMAGING 07/18/2022  Interpretation Summary   The study is normal. The study is low risk.   No ST deviation was noted. Arrhythmias during recovery: frequent PVCs.   LV perfusion is normal. There is no evidence of ischemia. There is no evidence of infarction. Significant extracardiac uptake is present, however wall motion appears normal and likely does not impact interpretation of perfusion.   Left ventricular function is normal. Calculated ejection fraction performed but not reported, visually appears 60%. End diastolic cavity size is normal.   Prior study available for comparison from 08/15/2015. No changes compared to prior study.   ECHOCARDIOGRAM  Cardiology Office Note:  .   Date:  06/09/2023  ID:  Clinton [name], DOB May 28, 1956, MRN [id number] PCP: Darrow Bussing, MD  [facility] Providers Cardiologist:  Nanetta Batty, MD     History of Present Illness: [name] Kitchen   Clinton [name] is a 67 y.o. male with PMH of hypertension, hyperlipidemia, PAD and carotid artery disease s/p R CEA on 09/05/2015.  He has significant family history of heart issue with his father died of MI at the age of 58, mother at age 15, younger brother had CABG.  He himself has never had a heart attack or stroke.  He has been followed by [name] for critical limb ischemia and had endovascular therapy for his right popliteal artery, left popliteal artery and the peroneal artery.  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  He had a calcium scoring test on 07/01/2022 which showed calcium score of 1756 majority of which is in the LAD and RCA territory, he was completely asymptomatic.  Subsequent Myoview obtained on 07/18/2022 was normal.  Echocardiogram obtained on 07/09/2022 shows EF 60 to 65%, mild LVH of the basal septal segment, grade 1 DD.  Patient underwent lower extremity angiography on 10/28/2022 which showed widely patent left common femoral and profundofemoral artery, heavily calcified but patent left SFA.  Below-knee popliteal artery is heavily calcified and diffusely diseased was 80 to 90% stenosis in the distal popliteal artery at the level of the anterior tibial artery.  This was treated with with drug-coated balloon angioplasty.  He was last seen by [name] on 06/01/2023 at which time he was doing well.  Patient presents today accompanied by his wife, in the past several weeks, he has been having intermittent chest tightness.  Chest tightness primarily occurs with physical activity.  It usually last about 5 to 10 minutes.  Chest tightness is not exacerbated by deep inspiration, body rotation or palpation.   Because his prior history of right below-knee amputation, he does not do much strenuous activity.  EKG showed normal sinus rhythm, chronic right bundle branch block, minimal ST depression in V3-V5.  His case was reviewed with DOD Dr. Jacques Navy, given very high pretest probability of underlying coronary artery disease, we recommend proceeding directly to cardiac catheterization.  Of note, his son was scheduled to undergo valve repair today however tested positive for COVID.  Therefore his sons valve surgery has been moved to next Thursday.  Risk and the benefit of cardiac authorization has been explained to the patient and the wife, he is agreeable to proceed.  ROS:   Patient complains of chest pain, denies any significant shortness of breath.  He has no lower extremity edema, orthopnea or PND.  Studies Reviewed: .        Cardiac Studies & Procedures     STRESS TESTS  MYOCARDIAL PERFUSION IMAGING 07/18/2022  Interpretation Summary   The study is normal. The study is low risk.   No ST deviation was noted. Arrhythmias during recovery: frequent PVCs.   LV perfusion is normal. There is no evidence of ischemia. There is no evidence of infarction. Significant extracardiac uptake is present, however wall motion appears normal and likely does not impact interpretation of perfusion.   Left ventricular function is normal. Calculated ejection fraction performed but not reported, visually appears 60%. End diastolic cavity size is normal.   Prior study available for comparison from 08/15/2015. No changes compared to prior study.   ECHOCARDIOGRAM  ECHOCARDIOGRAM COMPLETE 07/09/2022  Narrative ECHOCARDIOGRAM REPORT    Patient Name:   Clinton [name] Date of Exam: 07/09/2022 Medical Rec #:  [id number]         Height:       70.0 in Accession #:    [id number]        Weight:       173.0 lb Date of Birth:  06-17-1956        BSA:          1.963 m Patient Age:    79 years          BP:           110/72  mmHg Patient Gender: M                 HR:           76 bpm. Exam Location:  Outpatient  Procedure: 2D Echo, 3D Echo, Color Doppler, Cardiac Doppler and Strain Analysis  Indications:    Hyperlipidemia  History:        Patient has no prior history of Echocardiogram examinations. PAD; Risk Factors:Non-Smoker, Dyslipidemia and Hypertension. Acute renal failure.  Sonographer:    Jeryl Columbia RDCS Referring Phys: [phone] JONATHAN J BERRY  IMPRESSIONS   1. Left ventricular ejection fraction, by estimation, is 60 to 65%. The left ventricle has normal function. The left ventricle has no regional wall motion abnormalities. There is mild left ventricular hypertrophy of the basal-septal segment. Left ventricular diastolic parameters are consistent with Grade I diastolic dysfunction (impaired relaxation). The average left ventricular global longitudinal strain is -12.3 %. The global longitudinal strain is abnormal. 2. Right ventricular systolic function is normal. The right ventricular size is mildly enlarged. 3. The mitral valve is normal in structure. No evidence of mitral valve regurgitation. No evidence of mitral stenosis. 4. The aortic valve is tricuspid. Aortic valve regurgitation is not visualized. No aortic stenosis is present. 5. The inferior vena cava is normal in size with greater than 50% respiratory variability, suggesting right atrial pressure of 3 mmHg.  Comparison(s): No prior Echocardiogram.  FINDINGS Left Ventricle: Left ventricular ejection fraction, by estimation, is 60 to 65%. The left ventricle has normal function. The left ventricle has no regional wall motion abnormalities. The average left ventricular global longitudinal strain is -12.3 %. The global longitudinal strain is abnormal. The left ventricular internal cavity size was normal in size. There is mild left ventricular hypertrophy of the basal-septal segment. Left ventricular diastolic parameters are consistent with  Grade I diastolic dysfunction (impaired relaxation).  Right Ventricle: The right ventricular size is mildly enlarged. Right ventricular systolic function is normal.  Left Atrium: Left atrial size was normal in size.  Right Atrium: Right atrial size was normal in size.  Pericardium: There is no evidence of pericardial effusion.  Mitral Valve: The mitral valve is normal in structure. No evidence of mitral valve regurgitation. No evidence of mitral valve stenosis.  Tricuspid Valve: The tricuspid valve is normal in structure. Tricuspid valve regurgitation is trivial. No evidence of tricuspid stenosis.  Aortic Valve: The aortic valve is tricuspid. Aortic valve regurgitation is not visualized. No aortic stenosis is present.  Pulmonic Valve: The pulmonic valve was normal in structure. Pulmonic valve regurgitation is trivial. No evidence of pulmonic stenosis.  Aorta: The aortic root is normal in size and structure.  Venous: The inferior vena cava is normal in size with greater than 50% respiratory variability, suggesting right atrial pressure of 3 mmHg.  ECHOCARDIOGRAM COMPLETE 07/09/2022  Narrative ECHOCARDIOGRAM REPORT    Patient Name:   Clinton [name] Date of Exam: 07/09/2022 Medical Rec #:  [id number]         Height:       70.0 in Accession #:    [id number]        Weight:       173.0 lb Date of Birth:  06-17-1956        BSA:          1.963 m Patient Age:    79 years          BP:           110/72  mmHg Patient Gender: M                 HR:           76 bpm. Exam Location:  Outpatient  Procedure: 2D Echo, 3D Echo, Color Doppler, Cardiac Doppler and Strain Analysis  Indications:    Hyperlipidemia  History:        Patient has no prior history of Echocardiogram examinations. PAD; Risk Factors:Non-Smoker, Dyslipidemia and Hypertension. Acute renal failure.  Sonographer:    Jeryl Columbia RDCS Referring Phys: [phone] JONATHAN J BERRY  IMPRESSIONS   1. Left ventricular ejection fraction, by estimation, is 60 to 65%. The left ventricle has normal function. The left ventricle has no regional wall motion abnormalities. There is mild left ventricular hypertrophy of the basal-septal segment. Left ventricular diastolic parameters are consistent with Grade I diastolic dysfunction (impaired relaxation). The average left ventricular global longitudinal strain is -12.3 %. The global longitudinal strain is abnormal. 2. Right ventricular systolic function is normal. The right ventricular size is mildly enlarged. 3. The mitral valve is normal in structure. No evidence of mitral valve regurgitation. No evidence of mitral stenosis. 4. The aortic valve is tricuspid. Aortic valve regurgitation is not visualized. No aortic stenosis is present. 5. The inferior vena cava is normal in size with greater than 50% respiratory variability, suggesting right atrial pressure of 3 mmHg.  Comparison(s): No prior Echocardiogram.  FINDINGS Left Ventricle: Left ventricular ejection fraction, by estimation, is 60 to 65%. The left ventricle has normal function. The left ventricle has no regional wall motion abnormalities. The average left ventricular global longitudinal strain is -12.3 %. The global longitudinal strain is abnormal. The left ventricular internal cavity size was normal in size. There is mild left ventricular hypertrophy of the basal-septal segment. Left ventricular diastolic parameters are consistent with  Grade I diastolic dysfunction (impaired relaxation).  Right Ventricle: The right ventricular size is mildly enlarged. Right ventricular systolic function is normal.  Left Atrium: Left atrial size was normal in size.  Right Atrium: Right atrial size was normal in size.  Pericardium: There is no evidence of pericardial effusion.  Mitral Valve: The mitral valve is normal in structure. No evidence of mitral valve regurgitation. No evidence of mitral valve stenosis.  Tricuspid Valve: The tricuspid valve is normal in structure. Tricuspid valve regurgitation is trivial. No evidence of tricuspid stenosis.  Aortic Valve: The aortic valve is tricuspid. Aortic valve regurgitation is not visualized. No aortic stenosis is present.  Pulmonic Valve: The pulmonic valve was normal in structure. Pulmonic valve regurgitation is trivial. No evidence of pulmonic stenosis.  Aorta: The aortic root is normal in size and structure.  Venous: The inferior vena cava is normal in size with greater than 50% respiratory variability, suggesting right atrial pressure of 3 mmHg.  Cardiology Office Note:  .   Date:  06/09/2023  ID:  Clinton [name], DOB May 28, 1956, MRN [id number] PCP: Darrow Bussing, MD  [facility] Providers Cardiologist:  Nanetta Batty, MD     History of Present Illness: [name] Kitchen   Clinton [name] is a 67 y.o. male with PMH of hypertension, hyperlipidemia, PAD and carotid artery disease s/p R CEA on 09/05/2015.  He has significant family history of heart issue with his father died of MI at the age of 58, mother at age 15, younger brother had CABG.  He himself has never had a heart attack or stroke.  He has been followed by [name] for critical limb ischemia and had endovascular therapy for his right popliteal artery, left popliteal artery and the peroneal artery.  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  He had a calcium scoring test on 07/01/2022 which showed calcium score of 1756 majority of which is in the LAD and RCA territory, he was completely asymptomatic.  Subsequent Myoview obtained on 07/18/2022 was normal.  Echocardiogram obtained on 07/09/2022 shows EF 60 to 65%, mild LVH of the basal septal segment, grade 1 DD.  Patient underwent lower extremity angiography on 10/28/2022 which showed widely patent left common femoral and profundofemoral artery, heavily calcified but patent left SFA.  Below-knee popliteal artery is heavily calcified and diffusely diseased was 80 to 90% stenosis in the distal popliteal artery at the level of the anterior tibial artery.  This was treated with with drug-coated balloon angioplasty.  He was last seen by [name] on 06/01/2023 at which time he was doing well.  Patient presents today accompanied by his wife, in the past several weeks, he has been having intermittent chest tightness.  Chest tightness primarily occurs with physical activity.  It usually last about 5 to 10 minutes.  Chest tightness is not exacerbated by deep inspiration, body rotation or palpation.   Because his prior history of right below-knee amputation, he does not do much strenuous activity.  EKG showed normal sinus rhythm, chronic right bundle branch block, minimal ST depression in V3-V5.  His case was reviewed with DOD Dr. Jacques Navy, given very high pretest probability of underlying coronary artery disease, we recommend proceeding directly to cardiac catheterization.  Of note, his son was scheduled to undergo valve repair today however tested positive for COVID.  Therefore his sons valve surgery has been moved to next Thursday.  Risk and the benefit of cardiac authorization has been explained to the patient and the wife, he is agreeable to proceed.  ROS:   Patient complains of chest pain, denies any significant shortness of breath.  He has no lower extremity edema, orthopnea or PND.  Studies Reviewed: .        Cardiac Studies & Procedures     STRESS TESTS  MYOCARDIAL PERFUSION IMAGING 07/18/2022  Interpretation Summary   The study is normal. The study is low risk.   No ST deviation was noted. Arrhythmias during recovery: frequent PVCs.   LV perfusion is normal. There is no evidence of ischemia. There is no evidence of infarction. Significant extracardiac uptake is present, however wall motion appears normal and likely does not impact interpretation of perfusion.   Left ventricular function is normal. Calculated ejection fraction performed but not reported, visually appears 60%. End diastolic cavity size is normal.   Prior study available for comparison from 08/15/2015. No changes compared to prior study.   ECHOCARDIOGRAM

## 2023-06-09 NOTE — Patient Instructions (Addendum)
Medication Instructions:  START NITROGLYCERIN 0.4 MG AS NEEDED FOR CHEST PAIN  NIGHT BEFORE PROCEDURE HOLD IRBESARTAN HYDROCHLOROTHIAZIDE  *If you need a refill on your cardiac medications before your next appointment, please call your pharmacy*   Lab Work: BMET AND CBC TODAY If you have labs (blood work) drawn today and your tests are completely normal, you will receive your results only by: MyChart Message (if you have MyChart) OR A paper copy in the mail If you have any lab test that is abnormal or we need to change your treatment, we will call you to review the results.   Testing/Procedures:       Cardiac/Peripheral Catheterization   You are scheduled for a Cardiac Catheterization on Monday, November 4 with Dr. Alverda Skeans.  1. Please arrive at the [facility] (Main Entrance A) at [facility]: [address] [place], [place] [address] at 7:00 AM (This time is 2 hour(s) before your procedure to ensure your preparation). Free valet parking service is available. You will check in at ADMITTING. The support person will be asked to wait in the waiting room.  It is OK to have someone drop you off and come back when you are ready to be discharged.        Special note: Every effort is made to have your procedure done on time. Please understand that emergencies sometimes delay scheduled procedures.  2. Diet: Do not eat solid foods after midnight.  You may have clear liquids until 5 AM the day of the procedure.  3. Labs: You will need to have blood drawn today 06/09/23. You do not need to be fasting.  4. Medication instructions in preparation for your procedure:   Contrast Allergy: No  Night before procedure hold irbesartan hydrochlorothiazide  On the morning of your procedure, take Aspirin 81 mg and Plavix/Clopidogrel and any morning medicines NOT listed above.  You may use sips of water.  5. Plan to go home the same day, you will only stay overnight if medically  necessary. 6. You MUST have a responsible adult to drive you home. 7. An adult MUST be with you the first 24 hours after you arrive home. 8. Bring a current list of your medications, and the last time and date medication taken. 9. Bring ID and current insurance cards. 10.Please wear clothes that are easy to get on and off and wear slip-on shoes.  Thank you for allowing [place] to care for you!   --  Invasive Cardiovascular services    Follow-Up: At [facility], you and your health needs are our priority.  As part of our continuing mission to provide you with exceptional heart care, we have created designated Provider Care Teams.  These Care Teams include your primary Cardiologist (physician) and Advanced Practice Providers (APPs -  Physician Assistants and Nurse Practitioners) who all work together to provide you with the care you need, when you need it.   Your next appointment:   2-3 week(s) after heart cath  Provider:   Azalee Course, PA

## 2023-06-09 NOTE — Progress Notes (Signed)
Cardiology Office Note:  .   Date:  06/09/2023  ID:  Clinton [name], DOB May 28, 1956, MRN [id number] PCP: Darrow Bussing, MD  [facility] Providers Cardiologist:  Nanetta Batty, MD     History of Present Illness: [name] Kitchen   Clinton [name] is a 67 y.o. male with PMH of hypertension, hyperlipidemia, PAD and carotid artery disease s/p R CEA on 09/05/2015.  He has significant family history of heart issue with his father died of MI at the age of 58, mother at age 15, younger brother had CABG.  He himself has never had a heart attack or stroke.  He has been followed by [name] for critical limb ischemia and had endovascular therapy for his right popliteal artery, left popliteal artery and the peroneal artery.  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  He had a calcium scoring test on 07/01/2022 which showed calcium score of 1756 majority of which is in the LAD and RCA territory, he was completely asymptomatic.  Subsequent Myoview obtained on 07/18/2022 was normal.  Echocardiogram obtained on 07/09/2022 shows EF 60 to 65%, mild LVH of the basal septal segment, grade 1 DD.  Patient underwent lower extremity angiography on 10/28/2022 which showed widely patent left common femoral and profundofemoral artery, heavily calcified but patent left SFA.  Below-knee popliteal artery is heavily calcified and diffusely diseased was 80 to 90% stenosis in the distal popliteal artery at the level of the anterior tibial artery.  This was treated with with drug-coated balloon angioplasty.  He was last seen by [name] on 06/01/2023 at which time he was doing well.  Patient presents today accompanied by his wife, in the past several weeks, he has been having intermittent chest tightness.  Chest tightness primarily occurs with physical activity.  It usually last about 5 to 10 minutes.  Chest tightness is not exacerbated by deep inspiration, body rotation or palpation.   Because his prior history of right below-knee amputation, he does not do much strenuous activity.  EKG showed normal sinus rhythm, chronic right bundle branch block, minimal ST depression in V3-V5.  His case was reviewed with DOD Dr. Jacques Navy, given very high pretest probability of underlying coronary artery disease, we recommend proceeding directly to cardiac catheterization.  Of note, his son was scheduled to undergo valve repair today however tested positive for COVID.  Therefore his sons valve surgery has been moved to next Thursday.  Risk and the benefit of cardiac authorization has been explained to the patient and the wife, he is agreeable to proceed.  ROS:   Patient complains of chest pain, denies any significant shortness of breath.  He has no lower extremity edema, orthopnea or PND.  Studies Reviewed: .        Cardiac Studies & Procedures     STRESS TESTS  MYOCARDIAL PERFUSION IMAGING 07/18/2022  Interpretation Summary   The study is normal. The study is low risk.   No ST deviation was noted. Arrhythmias during recovery: frequent PVCs.   LV perfusion is normal. There is no evidence of ischemia. There is no evidence of infarction. Significant extracardiac uptake is present, however wall motion appears normal and likely does not impact interpretation of perfusion.   Left ventricular function is normal. Calculated ejection fraction performed but not reported, visually appears 60%. End diastolic cavity size is normal.   Prior study available for comparison from 08/15/2015. No changes compared to prior study.   ECHOCARDIOGRAM  Cardiology Office Note:  .   Date:  06/09/2023  ID:  Clinton [name], DOB May 28, 1956, MRN [id number] PCP: Darrow Bussing, MD  [facility] Providers Cardiologist:  Nanetta Batty, MD     History of Present Illness: [name] Kitchen   Clinton [name] is a 67 y.o. male with PMH of hypertension, hyperlipidemia, PAD and carotid artery disease s/p R CEA on 09/05/2015.  He has significant family history of heart issue with his father died of MI at the age of 58, mother at age 15, younger brother had CABG.  He himself has never had a heart attack or stroke.  He has been followed by [name] for critical limb ischemia and had endovascular therapy for his right popliteal artery, left popliteal artery and the peroneal artery.  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  He had a calcium scoring test on 07/01/2022 which showed calcium score of 1756 majority of which is in the LAD and RCA territory, he was completely asymptomatic.  Subsequent Myoview obtained on 07/18/2022 was normal.  Echocardiogram obtained on 07/09/2022 shows EF 60 to 65%, mild LVH of the basal septal segment, grade 1 DD.  Patient underwent lower extremity angiography on 10/28/2022 which showed widely patent left common femoral and profundofemoral artery, heavily calcified but patent left SFA.  Below-knee popliteal artery is heavily calcified and diffusely diseased was 80 to 90% stenosis in the distal popliteal artery at the level of the anterior tibial artery.  This was treated with with drug-coated balloon angioplasty.  He was last seen by [name] on 06/01/2023 at which time he was doing well.  Patient presents today accompanied by his wife, in the past several weeks, he has been having intermittent chest tightness.  Chest tightness primarily occurs with physical activity.  It usually last about 5 to 10 minutes.  Chest tightness is not exacerbated by deep inspiration, body rotation or palpation.   Because his prior history of right below-knee amputation, he does not do much strenuous activity.  EKG showed normal sinus rhythm, chronic right bundle branch block, minimal ST depression in V3-V5.  His case was reviewed with DOD Dr. Jacques Navy, given very high pretest probability of underlying coronary artery disease, we recommend proceeding directly to cardiac catheterization.  Of note, his son was scheduled to undergo valve repair today however tested positive for COVID.  Therefore his sons valve surgery has been moved to next Thursday.  Risk and the benefit of cardiac authorization has been explained to the patient and the wife, he is agreeable to proceed.  ROS:   Patient complains of chest pain, denies any significant shortness of breath.  He has no lower extremity edema, orthopnea or PND.  Studies Reviewed: .        Cardiac Studies & Procedures     STRESS TESTS  MYOCARDIAL PERFUSION IMAGING 07/18/2022  Interpretation Summary   The study is normal. The study is low risk.   No ST deviation was noted. Arrhythmias during recovery: frequent PVCs.   LV perfusion is normal. There is no evidence of ischemia. There is no evidence of infarction. Significant extracardiac uptake is present, however wall motion appears normal and likely does not impact interpretation of perfusion.   Left ventricular function is normal. Calculated ejection fraction performed but not reported, visually appears 60%. End diastolic cavity size is normal.   Prior study available for comparison from 08/15/2015. No changes compared to prior study.   ECHOCARDIOGRAM  ECHOCARDIOGRAM COMPLETE 07/09/2022  Narrative ECHOCARDIOGRAM REPORT    Patient Name:   Clinton [name] Date of Exam: 07/09/2022 Medical Rec #:  [id number]         Height:       70.0 in Accession #:    [id number]        Weight:       173.0 lb Date of Birth:  06-17-1956        BSA:          1.963 m Patient Age:    79 years          BP:           110/72  mmHg Patient Gender: M                 HR:           76 bpm. Exam Location:  Outpatient  Procedure: 2D Echo, 3D Echo, Color Doppler, Cardiac Doppler and Strain Analysis  Indications:    Hyperlipidemia  History:        Patient has no prior history of Echocardiogram examinations. PAD; Risk Factors:Non-Smoker, Dyslipidemia and Hypertension. Acute renal failure.  Sonographer:    Jeryl Columbia RDCS Referring Phys: [phone] JONATHAN J BERRY  IMPRESSIONS   1. Left ventricular ejection fraction, by estimation, is 60 to 65%. The left ventricle has normal function. The left ventricle has no regional wall motion abnormalities. There is mild left ventricular hypertrophy of the basal-septal segment. Left ventricular diastolic parameters are consistent with Grade I diastolic dysfunction (impaired relaxation). The average left ventricular global longitudinal strain is -12.3 %. The global longitudinal strain is abnormal. 2. Right ventricular systolic function is normal. The right ventricular size is mildly enlarged. 3. The mitral valve is normal in structure. No evidence of mitral valve regurgitation. No evidence of mitral stenosis. 4. The aortic valve is tricuspid. Aortic valve regurgitation is not visualized. No aortic stenosis is present. 5. The inferior vena cava is normal in size with greater than 50% respiratory variability, suggesting right atrial pressure of 3 mmHg.  Comparison(s): No prior Echocardiogram.  FINDINGS Left Ventricle: Left ventricular ejection fraction, by estimation, is 60 to 65%. The left ventricle has normal function. The left ventricle has no regional wall motion abnormalities. The average left ventricular global longitudinal strain is -12.3 %. The global longitudinal strain is abnormal. The left ventricular internal cavity size was normal in size. There is mild left ventricular hypertrophy of the basal-septal segment. Left ventricular diastolic parameters are consistent with  Grade I diastolic dysfunction (impaired relaxation).  Right Ventricle: The right ventricular size is mildly enlarged. Right ventricular systolic function is normal.  Left Atrium: Left atrial size was normal in size.  Right Atrium: Right atrial size was normal in size.  Pericardium: There is no evidence of pericardial effusion.  Mitral Valve: The mitral valve is normal in structure. No evidence of mitral valve regurgitation. No evidence of mitral valve stenosis.  Tricuspid Valve: The tricuspid valve is normal in structure. Tricuspid valve regurgitation is trivial. No evidence of tricuspid stenosis.  Aortic Valve: The aortic valve is tricuspid. Aortic valve regurgitation is not visualized. No aortic stenosis is present.  Pulmonic Valve: The pulmonic valve was normal in structure. Pulmonic valve regurgitation is trivial. No evidence of pulmonic stenosis.  Aorta: The aortic root is normal in size and structure.  Venous: The inferior vena cava is normal in size with greater than 50% respiratory variability, suggesting right atrial pressure of 3 mmHg.  ECHOCARDIOGRAM COMPLETE 07/09/2022  Narrative ECHOCARDIOGRAM REPORT    Patient Name:   Clinton [name] Date of Exam: 07/09/2022 Medical Rec #:  [id number]         Height:       70.0 in Accession #:    [id number]        Weight:       173.0 lb Date of Birth:  06-17-1956        BSA:          1.963 m Patient Age:    79 years          BP:           110/72  mmHg Patient Gender: M                 HR:           76 bpm. Exam Location:  Outpatient  Procedure: 2D Echo, 3D Echo, Color Doppler, Cardiac Doppler and Strain Analysis  Indications:    Hyperlipidemia  History:        Patient has no prior history of Echocardiogram examinations. PAD; Risk Factors:Non-Smoker, Dyslipidemia and Hypertension. Acute renal failure.  Sonographer:    Jeryl Columbia RDCS Referring Phys: [phone] JONATHAN J BERRY  IMPRESSIONS   1. Left ventricular ejection fraction, by estimation, is 60 to 65%. The left ventricle has normal function. The left ventricle has no regional wall motion abnormalities. There is mild left ventricular hypertrophy of the basal-septal segment. Left ventricular diastolic parameters are consistent with Grade I diastolic dysfunction (impaired relaxation). The average left ventricular global longitudinal strain is -12.3 %. The global longitudinal strain is abnormal. 2. Right ventricular systolic function is normal. The right ventricular size is mildly enlarged. 3. The mitral valve is normal in structure. No evidence of mitral valve regurgitation. No evidence of mitral stenosis. 4. The aortic valve is tricuspid. Aortic valve regurgitation is not visualized. No aortic stenosis is present. 5. The inferior vena cava is normal in size with greater than 50% respiratory variability, suggesting right atrial pressure of 3 mmHg.  Comparison(s): No prior Echocardiogram.  FINDINGS Left Ventricle: Left ventricular ejection fraction, by estimation, is 60 to 65%. The left ventricle has normal function. The left ventricle has no regional wall motion abnormalities. The average left ventricular global longitudinal strain is -12.3 %. The global longitudinal strain is abnormal. The left ventricular internal cavity size was normal in size. There is mild left ventricular hypertrophy of the basal-septal segment. Left ventricular diastolic parameters are consistent with  Grade I diastolic dysfunction (impaired relaxation).  Right Ventricle: The right ventricular size is mildly enlarged. Right ventricular systolic function is normal.  Left Atrium: Left atrial size was normal in size.  Right Atrium: Right atrial size was normal in size.  Pericardium: There is no evidence of pericardial effusion.  Mitral Valve: The mitral valve is normal in structure. No evidence of mitral valve regurgitation. No evidence of mitral valve stenosis.  Tricuspid Valve: The tricuspid valve is normal in structure. Tricuspid valve regurgitation is trivial. No evidence of tricuspid stenosis.  Aortic Valve: The aortic valve is tricuspid. Aortic valve regurgitation is not visualized. No aortic stenosis is present.  Pulmonic Valve: The pulmonic valve was normal in structure. Pulmonic valve regurgitation is trivial. No evidence of pulmonic stenosis.  Aorta: The aortic root is normal in size and structure.  Venous: The inferior vena cava is normal in size with greater than 50% respiratory variability, suggesting right atrial pressure of 3 mmHg.  Cardiology Office Note:  .   Date:  06/09/2023  ID:  Clinton [name], DOB May 28, 1956, MRN [id number] PCP: Darrow Bussing, MD  [facility] Providers Cardiologist:  Nanetta Batty, MD     History of Present Illness: [name] Kitchen   Clinton [name] is a 67 y.o. male with PMH of hypertension, hyperlipidemia, PAD and carotid artery disease s/p R CEA on 09/05/2015.  He has significant family history of heart issue with his father died of MI at the age of 58, mother at age 15, younger brother had CABG.  He himself has never had a heart attack or stroke.  He has been followed by [name] for critical limb ischemia and had endovascular therapy for his right popliteal artery, left popliteal artery and the peroneal artery.  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  He had a calcium scoring test on 07/01/2022 which showed calcium score of 1756 majority of which is in the LAD and RCA territory, he was completely asymptomatic.  Subsequent Myoview obtained on 07/18/2022 was normal.  Echocardiogram obtained on 07/09/2022 shows EF 60 to 65%, mild LVH of the basal septal segment, grade 1 DD.  Patient underwent lower extremity angiography on 10/28/2022 which showed widely patent left common femoral and profundofemoral artery, heavily calcified but patent left SFA.  Below-knee popliteal artery is heavily calcified and diffusely diseased was 80 to 90% stenosis in the distal popliteal artery at the level of the anterior tibial artery.  This was treated with with drug-coated balloon angioplasty.  He was last seen by [name] on 06/01/2023 at which time he was doing well.  Patient presents today accompanied by his wife, in the past several weeks, he has been having intermittent chest tightness.  Chest tightness primarily occurs with physical activity.  It usually last about 5 to 10 minutes.  Chest tightness is not exacerbated by deep inspiration, body rotation or palpation.   Because his prior history of right below-knee amputation, he does not do much strenuous activity.  EKG showed normal sinus rhythm, chronic right bundle branch block, minimal ST depression in V3-V5.  His case was reviewed with DOD Dr. Jacques Navy, given very high pretest probability of underlying coronary artery disease, we recommend proceeding directly to cardiac catheterization.  Of note, his son was scheduled to undergo valve repair today however tested positive for COVID.  Therefore his sons valve surgery has been moved to next Thursday.  Risk and the benefit of cardiac authorization has been explained to the patient and the wife, he is agreeable to proceed.  ROS:   Patient complains of chest pain, denies any significant shortness of breath.  He has no lower extremity edema, orthopnea or PND.  Studies Reviewed: .        Cardiac Studies & Procedures     STRESS TESTS  MYOCARDIAL PERFUSION IMAGING 07/18/2022  Interpretation Summary   The study is normal. The study is low risk.   No ST deviation was noted. Arrhythmias during recovery: frequent PVCs.   LV perfusion is normal. There is no evidence of ischemia. There is no evidence of infarction. Significant extracardiac uptake is present, however wall motion appears normal and likely does not impact interpretation of perfusion.   Left ventricular function is normal. Calculated ejection fraction performed but not reported, visually appears 60%. End diastolic cavity size is normal.   Prior study available for comparison from 08/15/2015. No changes compared to prior study.   ECHOCARDIOGRAM

## 2023-06-10 LAB — BASIC METABOLIC PANEL
BUN/Creatinine Ratio: 13 (ref 10–24)
BUN: 13 mg/dL (ref 8–27)
CO2: 28 mmol/L (ref 20–29)
Calcium: 9.8 mg/dL (ref 8.6–10.2)
Chloride: 104 mmol/L (ref 96–106)
Creatinine, Ser: 1.04 mg/dL (ref 0.76–1.27)
Glucose: 115 mg/dL — ABNORMAL HIGH (ref 70–99)
Potassium: 4.7 mmol/L (ref 3.5–5.2)
Sodium: 141 mmol/L (ref 134–144)
eGFR: 78 mL/min/{1.73_m2} (ref >59)

## 2023-06-10 LAB — CBC
Hematocrit: 40.4 % (ref 37.5–51.0)
Hemoglobin: 13.4 g/dL (ref 13.0–17.7)
MCH: 32.5 pg (ref 26.6–33.0)
MCHC: 33.2 g/dL (ref 31.5–35.7)
MCV: 98 fL — ABNORMAL HIGH (ref 79–97)
Platelets: 257 10*3/uL (ref 150–450)
RBC: 4.12 x10E6/uL — ABNORMAL LOW (ref 4.14–5.80)
RDW: 12.5 % (ref 11.6–15.4)
WBC: 7.4 10*3/uL (ref 3.4–10.8)

## 2023-06-11 ENCOUNTER — Telehealth: Payer: Self-pay | Admitting: *Deleted

## 2023-06-11 NOTE — Telephone Encounter (Signed)
Cardiac Catheterization scheduled at [facility] for: Monday June 15, 2023 9 AM Arrival time [facility] Main Entrance A at: 7 AM  Nothing to eat after midnight prior to procedure, clear liquids until 5 AM day of procedure.  Medication instructions: -Hold:  Irbesartan-hydrochlorothiazide-will hold PM prior  -Other usual morning medications can be taken with sips of water including aspirin 81 mg and Plavix 75 mg.  Plan to go home the same day, you will only stay overnight if medically necessary.  You must have responsible adult to drive you home.  Someone must be with you the first 24 hours after you arrive home.  Reviewed procedure instructions with patient.

## 2023-06-15 ENCOUNTER — Inpatient Hospital Stay (HOSPITAL_COMMUNITY)
Admission: AD | Admit: 2023-06-15 | Discharge: 2023-06-29 | DRG: 234 | Disposition: A | Discharge status: Home Health | Payer: 59 | Attending: Internal Medicine | Admitting: Internal Medicine

## 2023-06-15 ENCOUNTER — Other Ambulatory Visit: Payer: Self-pay

## 2023-06-15 ENCOUNTER — Encounter (HOSPITAL_COMMUNITY): Payer: Self-pay | Admitting: Internal Medicine

## 2023-06-15 ENCOUNTER — Encounter (HOSPITAL_COMMUNITY)
Admission: AD | Disposition: A | Discharge status: Home Health | Payer: Self-pay | Source: Home / Self Care | Attending: Internal Medicine

## 2023-06-15 DIAGNOSIS — D62 Acute posthemorrhagic anemia: Secondary | ICD-10-CM | POA: Diagnosis not present

## 2023-06-15 DIAGNOSIS — E8770 Fluid overload, unspecified: Secondary | ICD-10-CM | POA: Diagnosis not present

## 2023-06-15 DIAGNOSIS — E7841 Elevated Lipoprotein(a): Secondary | ICD-10-CM | POA: Diagnosis present

## 2023-06-15 DIAGNOSIS — R0989 Other specified symptoms and signs involving the circulatory and respiratory systems: Secondary | ICD-10-CM | POA: Diagnosis not present

## 2023-06-15 DIAGNOSIS — Z880 Allergy status to penicillin: Secondary | ICD-10-CM

## 2023-06-15 DIAGNOSIS — Z7982 Long term (current) use of aspirin: Secondary | ICD-10-CM | POA: Diagnosis not present

## 2023-06-15 DIAGNOSIS — N179 Acute kidney failure, unspecified: Secondary | ICD-10-CM | POA: Diagnosis not present

## 2023-06-15 DIAGNOSIS — Z89511 Acquired absence of right leg below knee: Secondary | ICD-10-CM | POA: Diagnosis not present

## 2023-06-15 DIAGNOSIS — Z8249 Family history of ischemic heart disease and other diseases of the circulatory system: Secondary | ICD-10-CM | POA: Diagnosis not present

## 2023-06-15 DIAGNOSIS — Z79899 Other long term (current) drug therapy: Secondary | ICD-10-CM | POA: Diagnosis not present

## 2023-06-15 DIAGNOSIS — I25110 Atherosclerotic heart disease of native coronary artery with unstable angina pectoris: Secondary | ICD-10-CM | POA: Diagnosis present

## 2023-06-15 DIAGNOSIS — Z7902 Long term (current) use of antithrombotics/antiplatelets: Secondary | ICD-10-CM | POA: Diagnosis not present

## 2023-06-15 DIAGNOSIS — Z833 Family history of diabetes mellitus: Secondary | ICD-10-CM

## 2023-06-15 DIAGNOSIS — I4510 Unspecified right bundle-branch block: Secondary | ICD-10-CM | POA: Diagnosis present

## 2023-06-15 DIAGNOSIS — Z8349 Family history of other endocrine, nutritional and metabolic diseases: Secondary | ICD-10-CM | POA: Diagnosis not present

## 2023-06-15 DIAGNOSIS — I493 Ventricular premature depolarization: Secondary | ICD-10-CM | POA: Diagnosis not present

## 2023-06-15 DIAGNOSIS — E1151 Type 2 diabetes mellitus with diabetic peripheral angiopathy without gangrene: Secondary | ICD-10-CM | POA: Diagnosis present

## 2023-06-15 DIAGNOSIS — E785 Hyperlipidemia, unspecified: Secondary | ICD-10-CM | POA: Diagnosis not present

## 2023-06-15 DIAGNOSIS — I10 Essential (primary) hypertension: Secondary | ICD-10-CM | POA: Diagnosis present

## 2023-06-15 DIAGNOSIS — R34 Anuria and oliguria: Secondary | ICD-10-CM | POA: Diagnosis not present

## 2023-06-15 DIAGNOSIS — I25119 Atherosclerotic heart disease of native coronary artery with unspecified angina pectoris: Principal | ICD-10-CM | POA: Diagnosis present

## 2023-06-15 DIAGNOSIS — E7800 Pure hypercholesterolemia, unspecified: Secondary | ICD-10-CM | POA: Diagnosis present

## 2023-06-15 DIAGNOSIS — I739 Peripheral vascular disease, unspecified: Secondary | ICD-10-CM | POA: Diagnosis not present

## 2023-06-15 DIAGNOSIS — I779 Disorder of arteries and arterioles, unspecified: Secondary | ICD-10-CM | POA: Diagnosis not present

## 2023-06-15 DIAGNOSIS — Z882 Allergy status to sulfonamides status: Secondary | ICD-10-CM | POA: Diagnosis not present

## 2023-06-15 DIAGNOSIS — I6521 Occlusion and stenosis of right carotid artery: Secondary | ICD-10-CM | POA: Diagnosis not present

## 2023-06-15 DIAGNOSIS — I2510 Atherosclerotic heart disease of native coronary artery without angina pectoris: Secondary | ICD-10-CM | POA: Diagnosis not present

## 2023-06-15 DIAGNOSIS — J9589 Other postprocedural complications and disorders of respiratory system, not elsewhere classified: Secondary | ICD-10-CM | POA: Diagnosis not present

## 2023-06-15 DIAGNOSIS — Z9862 Peripheral vascular angioplasty status: Secondary | ICD-10-CM | POA: Diagnosis not present

## 2023-06-15 DIAGNOSIS — Z01810 Encounter for preprocedural cardiovascular examination: Secondary | ICD-10-CM | POA: Diagnosis not present

## 2023-06-15 DIAGNOSIS — Z951 Presence of aortocoronary bypass graft: Secondary | ICD-10-CM

## 2023-06-15 DIAGNOSIS — Z95828 Presence of other vascular implants and grafts: Secondary | ICD-10-CM | POA: Diagnosis not present

## 2023-06-15 HISTORY — PX: LEFT HEART CATH AND CORONARY ANGIOGRAPHY: CATH118249

## 2023-06-15 LAB — CBC
HCT: 38.5 % — ABNORMAL LOW (ref 39.0–52.0)
Hemoglobin: 13 g/dL (ref 13.0–17.0)
MCH: 32.3 pg (ref 26.0–34.0)
MCHC: 33.8 g/dL (ref 30.0–36.0)
MCV: 95.8 fL (ref 80.0–100.0)
Platelets: 219 10*3/uL (ref 150–400)
RBC: 4.02 MIL/uL — ABNORMAL LOW (ref 4.22–5.81)
RDW: 12.3 % (ref 11.5–15.5)
WBC: 4.9 10*3/uL (ref 4.0–10.5)
nRBC: 0 % (ref 0.0–0.2)

## 2023-06-15 LAB — CREATININE, SERUM
Creatinine, Ser: 1.1 mg/dL (ref 0.61–1.24)
GFR, Estimated: >60 mL/min (ref >60)

## 2023-06-15 SURGERY — LEFT HEART CATH AND CORONARY ANGIOGRAPHY
Anesthesia: LOCAL

## 2023-06-15 MED ORDER — IOHEXOL 350 MG/ML SOLN
INTRAVENOUS | Status: DC | PRN
  Administered 2023-06-15: 61 mL via INTRA_ARTERIAL

## 2023-06-15 MED ORDER — LIDOCAINE HCL (PF) 1 % IJ SOLN
INTRAMUSCULAR | Status: DC | PRN
  Administered 2023-06-15: 2 mL

## 2023-06-15 MED ORDER — FENTANYL CITRATE (PF) 100 MCG/2ML IJ SOLN
INTRAMUSCULAR | Status: AC
  Filled 2023-06-15: qty 2

## 2023-06-15 MED ORDER — HEPARIN SODIUM (PORCINE) 1000 UNIT/ML IJ SOLN
INTRAMUSCULAR | Status: AC
  Filled 2023-06-15: qty 10

## 2023-06-15 MED ORDER — ACETAMINOPHEN 325 MG PO TABS
650.0000 mg | ORAL_TABLET | ORAL | Status: DC | PRN
Start: 2023-06-15 — End: 2023-06-24
  Administered 2023-06-20: 650 mg via ORAL
  Filled 2023-06-15: qty 2

## 2023-06-15 MED ORDER — LIDOCAINE HCL (PF) 1 % IJ SOLN
INTRAMUSCULAR | Status: AC
  Filled 2023-06-15: qty 30

## 2023-06-15 MED ORDER — SODIUM CHLORIDE 0.9% FLUSH
3.0000 mL | Freq: Two times a day (BID) | INTRAVENOUS | Status: DC
  Administered 2023-06-15 – 2023-06-23 (×9): 3 mL via INTRAVENOUS

## 2023-06-15 MED ORDER — ONDANSETRON HCL 4 MG/2ML IJ SOLN: 4.0000 mg | Freq: Four times a day (QID) | INTRAMUSCULAR | Status: DC | PRN

## 2023-06-15 MED ORDER — AMLODIPINE BESYLATE 5 MG PO TABS
2.5000 mg | ORAL_TABLET | Freq: Every day | ORAL | Status: DC
  Administered 2023-06-15 – 2023-06-23 (×9): 2.5 mg via ORAL
  Filled 2023-06-15 (×9): qty 1

## 2023-06-15 MED ORDER — FUROSEMIDE 10 MG/ML IJ SOLN
20.0000 mg | Freq: Once | INTRAMUSCULAR | Status: AC
  Administered 2023-06-15: 20 mg via INTRAVENOUS
  Filled 2023-06-15: qty 2

## 2023-06-15 MED ORDER — MIDAZOLAM HCL 2 MG/2ML IJ SOLN
INTRAMUSCULAR | Status: DC | PRN
  Administered 2023-06-15: 1 mg via INTRAVENOUS

## 2023-06-15 MED ORDER — HEPARIN (PORCINE) IN NACL 1000-0.9 UT/500ML-% IV SOLN
INTRAVENOUS | Status: DC | PRN
  Administered 2023-06-15: 1000 mL via INTRA_ARTERIAL

## 2023-06-15 MED ORDER — HYDRALAZINE HCL 20 MG/ML IJ SOLN: 10.0000 mg | INTRAMUSCULAR | Status: AC | PRN

## 2023-06-15 MED ORDER — SODIUM CHLORIDE 0.9% FLUSH: 3.0000 mL | INTRAVENOUS | Status: DC | PRN

## 2023-06-15 MED ORDER — ASPIRIN 81 MG PO CHEW: 81.0000 mg | CHEWABLE_TABLET | ORAL | Status: DC

## 2023-06-15 MED ORDER — HEPARIN SODIUM (PORCINE) 5000 UNIT/ML IJ SOLN
5000.0000 [IU] | Freq: Three times a day (TID) | INTRAMUSCULAR | Status: DC
Start: 2023-06-15 — End: 2023-06-16
  Administered 2023-06-15 – 2023-06-16 (×2): 5000 [IU] via SUBCUTANEOUS
  Filled 2023-06-15 (×2): qty 1

## 2023-06-15 MED ORDER — FENTANYL CITRATE (PF) 100 MCG/2ML IJ SOLN
INTRAMUSCULAR | Status: DC | PRN
  Administered 2023-06-15: 25 ug via INTRAVENOUS

## 2023-06-15 MED ORDER — VERAPAMIL HCL 2.5 MG/ML IV SOLN
INTRAVENOUS | Status: DC | PRN
  Administered 2023-06-15: 10 mL via INTRA_ARTERIAL

## 2023-06-15 MED ORDER — SODIUM CHLORIDE 0.9 % WEIGHT BASED INFUSION: 1.0000 mL/kg/h | INTRAVENOUS | Status: DC

## 2023-06-15 MED ORDER — ATORVASTATIN CALCIUM 80 MG PO TABS
80.0000 mg | ORAL_TABLET | Freq: Every day | ORAL | Status: DC
  Administered 2023-06-15 – 2023-06-19 (×5): 80 mg via ORAL
  Filled 2023-06-15 (×5): qty 1

## 2023-06-15 MED ORDER — HEPARIN SODIUM (PORCINE) 1000 UNIT/ML IJ SOLN
INTRAMUSCULAR | Status: DC | PRN
  Administered 2023-06-15: 5000 [IU] via INTRAVENOUS

## 2023-06-15 MED ORDER — SODIUM CHLORIDE 0.9 % WEIGHT BASED INFUSION
3.0000 mL/kg/h | INTRAVENOUS | Status: DC
  Administered 2023-06-15: 3 mL/kg/h via INTRAVENOUS

## 2023-06-15 MED ORDER — SODIUM CHLORIDE 0.9 % IV SOLN: 250.0000 mL | INTRAVENOUS | Status: AC | PRN

## 2023-06-15 MED ORDER — LABETALOL HCL 5 MG/ML IV SOLN: 10.0000 mg | INTRAVENOUS | Status: AC | PRN

## 2023-06-15 MED ORDER — VERAPAMIL HCL 2.5 MG/ML IV SOLN
INTRAVENOUS | Status: AC
  Filled 2023-06-15: qty 2

## 2023-06-15 MED ORDER — MIDAZOLAM HCL 2 MG/2ML IJ SOLN
INTRAMUSCULAR | Status: AC
  Filled 2023-06-15: qty 2

## 2023-06-15 SURGICAL SUPPLY — 8 items
CATH INFINITI 5FR ANG PIGTAIL (CATHETERS) IMPLANT
CATH INFINITI AMBI 6FR TG (CATHETERS) IMPLANT
DEVICE RAD COMP TR BAND LRG (VASCULAR PRODUCTS) IMPLANT
GLIDESHEATH SLEND SS 6F .021 (SHEATH) IMPLANT
GUIDEWIRE INQWIRE 1.5J.035X260 (WIRE) IMPLANT
INQWIRE 1.5J .035X260CM (WIRE) ×1
PACK CARDIAC CATHETERIZATION (CUSTOM PROCEDURE TRAY) ×2 IMPLANT
SET ATX-X65L (MISCELLANEOUS) IMPLANT

## 2023-06-15 NOTE — Interval H&P Note (Signed)
History and Physical Interval Note:  06/15/2023 7:04 AM  Clinton [name]  has presented today for surgery, with the diagnosis of angina.  The various methods of treatment have been discussed with the patient and family. After consideration of risks, benefits and other options for treatment, the patient has consented to  Procedure(s): LEFT HEART CATH AND CORONARY ANGIOGRAPHY (N/A) as a surgical intervention.  The patient's history has been reviewed, patient examined, no change in status, stable for surgery.  I have reviewed the patient's chart and labs.  Questions were answered to the patient's satisfaction.     [name]

## 2023-06-15 NOTE — Interval H&P Note (Signed)
History and Physical Interval Note:  06/15/2023 7:29 AM  Clinton [name]  has presented today for surgery, with the diagnosis of angina.  The various methods of treatment have been discussed with the patient and family. After consideration of risks, benefits and other options for treatment, the patient has consented to  Procedure(s): LEFT HEART CATH AND CORONARY ANGIOGRAPHY (N/A) as a surgical intervention.  The patient's history has been reviewed, patient examined, no change in status, stable for surgery.  I have reviewed the patient's chart and labs.  Questions were answered to the patient's satisfaction.     [name]

## 2023-06-15 NOTE — Plan of Care (Signed)
Patient progressing with plan of care. 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 Ambulating unassisted with left BKA prosthesis to bathroom without difficulty. Maintaining appropriate vital signs:    06/15/23 1640  Vitals  BP (!) 142/68  MAP (mmHg) 89  Pulse Rate 78  ECG Heart Rate 75  MEWS COLOR  MEWS Score Color Green  Oxygen Therapy  SpO2 97 %  MEWS Score  MEWS Temp 0  MEWS Systolic 0  MEWS Pulse 0  MEWS RR 0  MEWS LOC 0  MEWS Score 0   Pending consultation per [facility]. Will continue to monitor closely.

## 2023-06-15 NOTE — Consult Note (Cosign Needed)
[address]       [place] [address]             [phone]        Clinton [name] [place] [facility] [place] Medical Record #[id number] Date of Birth: 07/28/1956  Referring: Clinton Pyo, MD Primary Care: Clinton Bussing, MD Primary Cardiologist:Clinton Allyson Sabal, MD  Reason for consult:  Evaluation for surgical management of critical left main coronary artery stenosis   History of Present Illness:     Clinton [name] is a 67 year old gentleman with a past history notable for hypertension, dyslipidemia, peripheral vascular disease status post right below the knee amputation for critical limb ischemia about one year ago, cerebrovascular disease status post right carotid endarterectomy in 2017.  He recently presented to [name], Clinton [name] for evaluation of exertional chest tightness.  An EKG was obtained obtained showing chronic right bundle branch block, minimal ST depression in V3 through V5.  Further evaluation with left heart catheterization was recommended and performed earlier today.  Coronary angiography demonstrates a 95% proximal left main coronary artery stenosis.  CT surgery has been asked to evaluate Clinton [name] for consideration of coronary bypass grafting.  Clinton [name] is followed by [name], vascular surgery. He takes Plavix regularly for management of Clinton vascular disease.  Clinton most recent vascular procedure was 10/28/2022 per [name] and included shockwave intra-arterial lithotripsy of the left popliteal and left tibioperoneal trunk followed by drug-coated balloon angioplasty of the same vessels.  Last dose of Plavix was earlier today.   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  He enjoys playing golf.  He denies any current dental problems.  He is right-handed   Zubrod Score: At the time of surgery this patient's most appropriate activity status/level should be described as: []     0    Normal activity, no symptoms []     1     Restricted in physical strenuous activity but ambulatory, able to do out light work []     2    Ambulatory and capable of self care, unable to do work activities, up and about                 more than 50%  Of the time                            [x]     3    Only limited self care, in bed greater than 50% of waking hours []     4    Completely disabled, no self care, confined to bed or chair []     5    Moribund  Past Medical History:  Diagnosis Date   Carotid artery occlusion    Cellulitis    Erectile dysfunction    Hyperlipidemia    Hypertension    Peripheral vascular disease (HCC)     Past Surgical History:  Procedure Laterality Date   ABDOMINAL AORTOGRAM W/LOWER EXTREMITY N/A 02/04/2022   Procedure: ABDOMINAL AORTOGRAM W/LOWER EXTREMITY;  Surgeon: Nada Libman, MD;  [facility];  Service: Cardiovascular;  Laterality: N/A;   ABDOMINAL AORTOGRAM W/LOWER EXTREMITY N/A 02/18/2022   Procedure: ABDOMINAL AORTOGRAM W/LOWER EXTREMITY;  Surgeon: Nada Libman, MD;  [facility]: [facility];  Service: Cardiovascular;  Laterality: N/A;   ABDOMINAL AORTOGRAM W/LOWER EXTREMITY N/A 04/15/2022   Procedure: ABDOMINAL AORTOGRAM W/LOWER EXTREMITY;  Surgeon: Nada Libman,  MD;  [facility]: [facility];  Service: Cardiovascular;  Laterality: N/A;   ABDOMINAL AORTOGRAM W/LOWER EXTREMITY N/A 10/28/2022   Procedure: ABDOMINAL AORTOGRAM W/LOWER EXTREMITY;  Surgeon: Nada Libman, MD;  [facility]: [facility];  Service: Cardiovascular;  Laterality: N/A;   AMPUTATION Right 04/16/2022   Procedure: RIGHT 5TH RAY AMPUTATION;  Surgeon: Nadara Mustard, MD;  Location: [facility] OR;  Service: Orthopedics;  Laterality: Right;   AMPUTATION Right 05/09/2022   Procedure: RIGHT BELOW KNEE AMPUTATION;  Surgeon: Nadara Mustard, MD;  Location: [facility] OR;  Service: Orthopedics;  Laterality: Right;   APPLICATION OF WOUND VAC Right 06/20/2022   Procedure: APPLICATION OF WOUND VAC;  Surgeon: Nadara Mustard, MD;  Location: [facility];  Service: Orthopedics;  Laterality: Right;   COLONOSCOPY     ENDARTERECTOMY Right 09/05/2015   Procedure: ENDARTERECTOMY CAROTID RIGHT;  Surgeon: Sherren Kerns, MD;  Location: [facility] OR;  Service: Vascular;  Laterality: Right;   LEFT HEART CATH AND CORONARY ANGIOGRAPHY N/A 06/15/2023   Procedure: LEFT HEART CATH AND CORONARY ANGIOGRAPHY;  Surgeon: Clinton Pyo, MD;  [facility];  Service: Cardiovascular;  Laterality: N/A;   PATCH ANGIOPLASTY  09/05/2015   Procedure: PATCH ANGIOPLASTY RIGHT CAROTID ARTERY USING HEMASHIELD PLATINUM FINESSE PATCH;  Surgeon: Sherren Kerns, MD;  Location: [facility] OR;  Service: Vascular;;   PERIPHERAL INTRAVASCULAR LITHOTRIPSY Left 10/28/2022   Procedure: INTRAVASCULAR LITHOTRIPSY;  Surgeon: Nada Libman, MD;  [facility];  Service: Cardiovascular;  Laterality: Left;  L peroneal   PERIPHERAL VASCULAR ATHERECTOMY Left 02/18/2022   Procedure: PERIPHERAL VASCULAR ATHERECTOMY;  Surgeon: Nada Libman, MD;  [facility];  Service: Cardiovascular;  Laterality: Left;  Popliteal and Peroneal   PERIPHERAL VASCULAR BALLOON ANGIOPLASTY  02/04/2022   Procedure: PERIPHERAL VASCULAR BALLOON ANGIOPLASTY;  Surgeon: Nada Libman, MD;  [facility];  Service: Cardiovascular;;   PERIPHERAL VASCULAR BALLOON ANGIOPLASTY Right 04/15/2022   Procedure: PERIPHERAL VASCULAR BALLOON ANGIOPLASTY;  Surgeon: Nada Libman, MD;  [facility];  Service: Cardiovascular;  Laterality: Right;  SFA/POPLITEAL   PERIPHERAL VASCULAR BALLOON ANGIOPLASTY Left 10/28/2022   Procedure: PERIPHERAL VASCULAR BALLOON ANGIOPLASTY;  Surgeon: Nada Libman, MD;  [facility];  Service: Cardiovascular;  Laterality: Left;  L peroneal and L pop   STUMP REVISION Right 06/20/2022   Procedure: REVISION RIGHT BELOW KNEE AMPUTATION;  Surgeon: Nadara Mustard, MD;  Location: [facility] OR;  Service:  Orthopedics;  Laterality: Right;   TONSILLECTOMY     VASECTOMY      Social History   Tobacco Use  Smoking Status Never  Smokeless Tobacco Former   Types: Chew   Quit date: 01/01/2015    Social History   Substance and Sexual Activity  Alcohol Use Not Currently     Allergies  Allergen Reactions   Penicillins Rash    Has patient had a PCN reaction causing immediate rash, facial/tongue/throat swelling, SOB or lightheadedness with hypotension: Yes Has patient had a PCN reaction causing severe rash involving mucus membranes or skin necrosis: No Has patient had a PCN reaction that required hospitalization No Has patient had a PCN reaction occurring within the last 10 years: No If all of the above answers are "NO", then may proceed with Cephalosporin use.  Tolerated Cefazolin on 04/16/22    Sulfa Antibiotics Swelling and Other (See Comments)    Facial/lip swelling    Current Facility-Administered  Medications  Medication Dose Route Frequency Provider Last Rate Last Admin   0.9 %  sodium chloride infusion  250 mL Intravenous PRN Clinton Pyo, MD 10 mL/hr at 06/15/23 1206 Rate Change at 06/15/23 1206   acetaminophen (TYLENOL) tablet 650 mg  650 mg Oral Q4H PRN Clinton Pyo, MD       amLODipine (NORVASC) tablet 2.5 mg  2.5 mg Oral Daily Clinton Pyo, MD       atorvastatin (LIPITOR) tablet 80 mg  80 mg Oral Daily Clinton Pyo, MD       heparin injection 5,000 Units  5,000 Units Subcutaneous Q8H Clinton Pyo, MD       hydrALAZINE (APRESOLINE) injection 10 mg  10 mg Intravenous Q20 Min PRN Clinton Pyo, MD       labetalol (NORMODYNE) injection 10 mg  10 mg Intravenous Q10 min PRN Clinton Pyo, MD       ondansetron [facility]) injection 4 mg  4 mg Intravenous Q6H PRN Clinton Pyo, MD       sodium chloride flush (NS) 0.9 % injection 3 mL  3 mL Intravenous Q12H Clinton Pyo, MD       sodium chloride flush (NS) 0.9 % injection 3 mL  3 mL Intravenous PRN  Clinton Pyo, MD        Medications Prior to Admission  Medication Sig Dispense Refill Last Dose   amLODipine (NORVASC) 2.5 MG tablet Take 2.5 mg by mouth at bedtime.   06/14/2023   aspirin EC 81 MG tablet Take 81 mg by mouth at bedtime.   06/15/2023 at 0545   atorvastatin (LIPITOR) 80 MG tablet Take 80 mg by mouth at bedtime.   06/14/2023   clopidogrel (PLAVIX) 75 MG tablet Take 1 tablet (75 mg total) by mouth daily. 30 tablet 11 06/15/2023 at 0545   irbesartan-hydrochlorothiazide (AVALIDE) 300-12.5 MG tablet Take 1 tablet by mouth at bedtime.   Past Week   Multiple Vitamin (MULTIVITAMIN) capsule Take 2 capsules by mouth every morning. Gummy   06/15/2023   nitroGLYCERIN (NITROSTAT) 0.4 MG SL tablet Place 1 tablet (0.4 mg total) under the tongue every 5 (five) minutes as needed for chest pain. 25 tablet 3 Past Week   Omega-3 Fatty Acids (OMEGA 3 PO) Take 2 capsules by mouth daily. 454 mg each   06/15/2023   Probiotic Product (PROBIOTIC PO) Take 220 mg by mouth daily.   06/15/2023   vitamin C (ASCORBIC ACID) 250 MG tablet Take 500 mg by mouth daily.   06/15/2023   zinc sulfate 220 (50 Zn) MG capsule Take 1 capsule (220 mg total) by mouth daily.   06/15/2023   terbinafine (LAMISIL) 250 MG tablet Take 250 mg by mouth daily.   More than a month    Family History  Problem Relation Age of Onset   Other Mother        circulatory problems   Heart attack Mother    Deep vein thrombosis Mother    Heart disease Mother    Hyperlipidemia Father    Hypertension Father    Diabetes Father    Deep vein thrombosis Father    Heart disease Father        before age 57   Heart attack Father    Peripheral vascular disease Father    Hypertension Brother    Hyperlipidemia Brother      Review of Systems:     Cardiac Review of Systems: Y or  [    ]=  no  Chest Pain [   x ]  Resting SOB [   ] Exertional SOB  [ x ]  Orthopnea [  ]   Pedal Edema [   ]    Palpitations [  ] Syncope  [  ]   Presyncope [    ]  General Review of Systems: [Y] = yes [  ]=no Constitional: recent weight change [  ]; anorexia [  ]; fatigue [  ]; nausea [  ]; night sweats [  ]; fever [  ]; or chills [  ]                                                                 Eye : blurred vision [  ]; diplopia [   ]; vision changes [  ];  Amaurosis fugax[  ]; Resp: cough [  ];  wheezing[  ];  hemoptysis[  ]; shortness of breath[x  ]; paroxysmal nocturnal dyspnea[  ]; dyspnea on exertion[x  ]; or orthopnea[  ];  GI:  gallstones[  ], vomiting[  ];  dysphagia[  ]; melena[  ];  hematochezia [  ]; heartburn[  ];   Hx of  Colonoscopy[  ]; GU: kidney stones [  ]; hematuria[  ];   dysuria [  ];  nocturia[  ];  history of     obstruction [  ]; urinary frequency [  ]             Skin: rash, swelling[  ];, hair loss[  ];  peripheral edema[  ];  or itching[  ]; Musculosketetal: myalgias[  ];  joint swelling[  ];  joint erythema[  ];  joint pain[  ];  back pain[  ];  Heme/Lymph: bruising[  ];  bleeding[  ];  anemia[  ];  Neuro: TIA[  ];  headaches[  ];  stroke[  ];  vertigo[  ];  seizures[  ];   paresthesias[  ];  difficulty walking[  ];  Psych:depression[  ]; anxiety[  ];  Endocrine: diabetes[  ];  thyroid dysfunction[  ];                 Physical Exam: BP (!) 145/80 (BP Location: Left Arm)   Pulse 61   Temp 98.1 F (36.7 C) (Oral)   Resp 16   Ht 5\' 10"  (1.778 m)   Wt 88.9 kg   SpO2 100%   BMI 28.12 kg/m    General appearance: alert, cooperative, and no distress Head: Normocephalic, without obvious abnormality, atraumatic Neck: no adenopathy, no carotid bruit, no JVD, and supple, symmetrical, trachea midline Lymph nodes: No cervical or clavicular adenopathy Resp: Breath sounds full, full, and clear to auscultation Cardio: Regular rate and rhythm.  Monitor shows normal sinus rhythm in the 80s.  There is a grade 1/6 to 2/6 systolic murmur most audible at the left upper sternal border GI: Soft, nontender Extremities: Status  post right BKA.  The left foot is warm and well-perfused with brisk capillary refill.  I was not able to palpate either the dorsalis pedis or posterior tibial pulses.  Modified Allen's test was performed with the pulse oximetry probe applied to the left thumb.  A brisk waveform was maintained with occlusion of  the radial artery. Neurologic: Grossly normal  Diagnostic Studies & Laboratory data:     Recent Radiology Findings:    LEFT HEART CATH AND CORONARY ANGIOGRAPHY   Conclusion      Ost LM to Mid LM lesion is 95% stenosed.   1st Diag lesion is 50% stenosed.   1.  95% left main lesion.   2.  LVEDP of 36 mmHg.   Recommendation: Will admit and obtain cardiothoracic surgical evaluation.  Will administer Lasix 20 mg IV x 1 given elevated LVEDP.   Indications  Angina pectoris (HCC) [I20.9 (ICD-10-CM)]    Coronary Findings  Diagnostic Dominance: Right Left Main  Ost LM to Mid LM lesion is 95% stenosed.    Left Anterior Descending  The vessel exhibits minimal luminal irregularities.    First Diagonal Branch  1st Diag lesion is 50% stenosed.    Right Coronary Artery  The vessel exhibits minimal luminal irregularities.    Intervention   No interventions have been documented.   Coronary Diagrams  Diagnostic Dominance: Right      I have independently reviewed the above radiologic studies and discussed with the patient   Recent Lab Findings: Lab Results  Component Value Date   WBC 4.9 06/15/2023   HGB 13.0 06/15/2023   HCT 38.5 (L) 06/15/2023   PLT 219 06/15/2023   GLUCOSE 115 (H) 06/09/2023   CHOL 107 04/16/2022   TRIG 59 04/16/2022   HDL 37 (L) 04/16/2022   LDLCALC 58 04/16/2022   ALT 23 06/20/2022   AST 20 06/20/2022   NA 141 06/09/2023   K 4.7 06/09/2023   CL 104 06/09/2023   CREATININE 1.10 06/15/2023   BUN 13 06/09/2023   CO2 28 06/09/2023   INR 1.00 08/27/2015   HGBA1C 5.4 04/15/2022      Assessment / Plan:      67 year old gentleman with  the above described past medical history notable for hypertension, dyslipidemia, cerebrovascular disease status post right carotid endarterectomy, peripheral vascular disease status post right BKA for critical limb ischemia last year and more recently having had angioplasty with stent placement to the left lower extremity.  He was recently evaluated for exertional chest tightness and was found to have critical left main coronary artery stenosis on coronary angiography earlier today.  Clinton last dose of Plavix was this morning.  We agree that coronary bypass grafting is Clinton best option for revascularization.  We will need to wait a minimum of 5 days for Plavix washout to optimize perioperative safety.  The procedure and expected perioperative [name] were discussed with Clinton [name] and Clinton [name] in detail and their questions were answered.  We will proceed with preoperative workup including carotid duplex scan and echocardiogram.  Will tentatively plan for coronary bypass grafting with Dr. Cliffton Asters early next week.  I  spent 25 minutes counseling the patient face to face.   Clinton [name], Clinton [name]  06/15/2023 4:44 PM      Agree with above. 67yo male with LM disease.  Echocardiogram shows preserved biventricular function and no significant valvular disease.  On review of Clinton LHC, he does not have good lateral wall targets, but there is a dominant diagonal.  The LAD also appears to be a good target.  He will need a plavix washout prior to surgery.    Harrell Keane Scrape

## 2023-06-16 ENCOUNTER — Inpatient Hospital Stay (HOSPITAL_COMMUNITY): Payer: 59

## 2023-06-16 DIAGNOSIS — Z01810 Encounter for preprocedural cardiovascular examination: Secondary | ICD-10-CM

## 2023-06-16 DIAGNOSIS — R0989 Other specified symptoms and signs involving the circulatory and respiratory systems: Secondary | ICD-10-CM

## 2023-06-16 DIAGNOSIS — I25110 Atherosclerotic heart disease of native coronary artery with unstable angina pectoris: Secondary | ICD-10-CM | POA: Diagnosis not present

## 2023-06-16 LAB — BASIC METABOLIC PANEL
Anion gap: 11 (ref 5–15)
BUN: 13 mg/dL (ref 8–23)
CO2: 24 mmol/L (ref 22–32)
Calcium: 9 mg/dL (ref 8.9–10.3)
Chloride: 105 mmol/L (ref 98–111)
Creatinine, Ser: 1.09 mg/dL (ref 0.61–1.24)
GFR, Estimated: >60 mL/min (ref >60)
Glucose, Bld: 128 mg/dL — ABNORMAL HIGH (ref 70–99)
Potassium: 4 mmol/L (ref 3.5–5.1)
Sodium: 140 mmol/L (ref 135–145)

## 2023-06-16 LAB — ECHOCARDIOGRAM COMPLETE
AR max vel: 2.66 cm2
AV Area VTI: 2.84 cm2
AV Area mean vel: 2.52 cm2
AV Mean grad: 4 mm[Hg]
AV Peak grad: 7 mm[Hg]
Ao pk vel: 1.32 m/s
Area-P 1/2: 3.39 cm2
Height: 70 in
S' Lateral: 2.4 cm
Weight: 3136 [oz_av]

## 2023-06-16 LAB — LIPID PANEL
Cholesterol: 130 mg/dL (ref 0–200)
HDL: 50 mg/dL (ref >40)
LDL Cholesterol: 70 mg/dL (ref 0–99)
Total CHOL/HDL Ratio: 2.6 {ratio}
Triglycerides: 52 mg/dL (ref <150)
VLDL: 10 mg/dL (ref 0–40)

## 2023-06-16 LAB — MAGNESIUM: Magnesium: 2.1 mg/dL (ref 1.7–2.4)

## 2023-06-16 LAB — HEMOGLOBIN A1C
Hgb A1c MFr Bld: 5.5 % (ref 4.8–5.6)
Mean Plasma Glucose: 111.15 mg/dL

## 2023-06-16 MED ORDER — ASPIRIN 81 MG PO TBEC
81.0000 mg | DELAYED_RELEASE_TABLET | Freq: Every day | ORAL | Status: DC
  Administered 2023-06-16 – 2023-06-23 (×8): 81 mg via ORAL
  Filled 2023-06-16 (×8): qty 1

## 2023-06-16 MED ORDER — EZETIMIBE 10 MG PO TABS
10.0000 mg | ORAL_TABLET | Freq: Every day | ORAL | Status: DC
  Administered 2023-06-17 – 2023-06-29 (×12): 10 mg via ORAL
  Filled 2023-06-16 (×12): qty 1

## 2023-06-16 MED ORDER — ENOXAPARIN SODIUM 40 MG/0.4ML IJ SOSY
40.0000 mg | PREFILLED_SYRINGE | Freq: Every day | INTRAMUSCULAR | Status: DC
  Administered 2023-06-16 – 2023-06-17 (×2): 40 mg via SUBCUTANEOUS
  Filled 2023-06-16 (×2): qty 0.4

## 2023-06-16 MED ORDER — PERFLUTREN LIPID MICROSPHERE
3.0000 mL | INTRAVENOUS | Status: AC | PRN
  Administered 2023-06-16: 3 mL via INTRAVENOUS

## 2023-06-16 MED ORDER — METOPROLOL TARTRATE 12.5 MG HALF TABLET
12.5000 mg | ORAL_TABLET | Freq: Two times a day (BID) | ORAL | Status: DC
  Administered 2023-06-16 – 2023-06-23 (×16): 12.5 mg via ORAL
  Filled 2023-06-16 (×16): qty 1

## 2023-06-16 NOTE — Plan of Care (Signed)
  Problem: Activity: Goal: Ability to return to baseline activity level will improve Outcome: Progressing   Problem: Cardiovascular: Goal: Ability to achieve and maintain adequate cardiovascular perfusion will improve Outcome: Progressing Goal: Vascular access site(s) Level 0-1 will be maintained Outcome: Progressing   Problem: Health Behavior/Discharge Planning: Goal: Ability to safely manage health-related needs after discharge will improve Outcome: Progressing   Problem: Health Behavior/Discharge Planning: Goal: Ability to manage health-related needs will improve Outcome: Progressing   Problem: Clinical Measurements: Goal: Ability to maintain clinical measurements within normal limits will improve Outcome: Progressing Goal: Will remain free from infection Outcome: Progressing Goal: Respiratory complications will improve Outcome: Progressing Goal: Cardiovascular complication will be avoided Outcome: Progressing   Problem: Activity: Goal: Risk for activity intolerance will decrease Outcome: Progressing   Problem: Nutrition: Goal: Adequate nutrition will be maintained Outcome: Progressing   Problem: Elimination: Goal: Will not experience complications related to bowel motility Outcome: Progressing Goal: Will not experience complications related to urinary retention Outcome: Progressing   Problem: Safety: Goal: Ability to remain free from injury will improve Outcome: Progressing

## 2023-06-16 NOTE — Progress Notes (Signed)
   Patient Name: JOAQUIM TOLEN Date of Encounter: 06/16/2023 [facility] Cardiologist: Nanetta Batty, MD   Interval Summary  .    No chest pain, wife at the bedside.   Vital Signs .    Vitals:   06/15/23 2252 06/16/23 0523 06/16/23 0700 06/16/23 0728  BP: (!) 119/58 136/60    Pulse: 75 62  77  Resp: 18 18  18   Temp:  97.9 F (36.6 C) 97.7 F (36.5 C) 97.7 F (36.5 C)  TempSrc: Oral Oral Oral Oral  SpO2:    97%  Weight:      Height:        Intake/Output Summary (Last 24 hours) at 06/16/2023 0803 Last data filed at 06/16/2023 0725 Gross per 24 hour  Intake 403.67 ml  Output 1565 ml  Net -1161.33 ml      06/15/2023    7:40 AM 06/09/2023    9:00 AM 06/01/2023   10:09 AM  Last 3 Weights  Weight (lbs) 196 lb 206 lb 207 lb  Weight (kg) 88.905 kg 93.441 kg 93.895 kg      Telemetry/ECG    Sinus Rhythm, PVCs - Personally Reviewed  Physical Exam .   GEN: No acute distress.   Neck: No JVD Cardiac: RRR, no murmurs, rubs, or gallops.  Respiratory: Clear to auscultation bilaterally. GI: Soft, nontender, non-distended  MS: No edema, right BKA Skin: Right radial cath site stable  Assessment & Plan .     67 y.o. male with PMH of hypertension, hyperlipidemia, PAD and carotid artery disease s/p R CEA on 09/05/2015 who presented for outpatient cardiac cath. Found to have severe LM disease, admitted with plans for CABG.  Unstable Angina CAD -- underwent outpatient cardiac cath with 95% ost/mLM stenosis, 1st diag lesion 50%. Seen by TCTS with need for plavix washout prior to surgery. Official MD recommendations pending. Echo pending. No chest pain this morning. -- continue ASA, statin, add metoprolol 12.5mg  BID  HLD -- check lipids, LP (a) pending -- continue atorvastatin 80mg  daily  HTN -- home ARB/hydrochlorothiazide held -- will add metoprolol 12.5mg  BID  PVCs -- check BMET, Mag -- add metoprolol 12.5mg  BID   Carotid artery disease S/p R CEA  '17 -- updated carotid dopplers pending -- on ASA, statin  PAD  S/p Right BKA -- Right below knee amputation in 2023 for critical limb ischemia. Recent angiography showed heavily calcified but patent left SFA and heavily calcified and diffusely diseased below knee popliteal artery with 80-90% stenosis at the level of the anterior tibial artery, treated with drug-coated balloon angioplasty -- on ASA, statin  For questions or updates, please contact [facility] Please consult [email or web address] for contact info under        Signed, Laverda Page, NP

## 2023-06-16 NOTE — Progress Notes (Signed)
Mobility Specialist Progress Note:   06/16/23 1500  Mobility  Activity Ambulated with assistance in hallway  Level of Assistance Contact guard assist, steadying assist  Assistive Device None  Distance Ambulated (ft) 250 ft  Activity Response Tolerated well  Mobility Referral Yes  $Mobility charge 1 Mobility  Mobility Specialist Start Time (ACUTE ONLY) 1457  Mobility Specialist Stop Time (ACUTE ONLY) 1508  Mobility Specialist Time Calculation (min) (ACUTE ONLY) 11 min    Pre Mobility: 67 HR During Mobility: 94 HR Post Mobility:  88 HR  Pt received in bed, agreeable to mobility. Mod I for bed mobility and donning RLE prosthetic. Asymptomatic w/ no complaints throughout. Pt left in bed with call bell and all needs met.  D'Vante Earlene Plater Mobility Specialist Please contact via Special educational needs teacher or Rehab office at [phone]

## 2023-06-17 ENCOUNTER — Encounter (HOSPITAL_COMMUNITY): Payer: Self-pay | Admitting: Internal Medicine

## 2023-06-17 DIAGNOSIS — I25119 Atherosclerotic heart disease of native coronary artery with unspecified angina pectoris: Secondary | ICD-10-CM | POA: Diagnosis not present

## 2023-06-17 LAB — LIPOPROTEIN A (LPA): Lipoprotein (a): 147.1 nmol/L — ABNORMAL HIGH (ref <75.0)

## 2023-06-17 NOTE — Progress Notes (Signed)
   Patient Name: Clinton [name] Date of Encounter: 06/17/2023 [facility] Cardiologist: Nanetta Batty, MD   Interval Summary  .    Feeling well this morning. No chest pain. His son is scheduled for valve surgery with Dr. Laneta Simmers tomorrow.   Vital Signs .    Vitals:   06/16/23 1338 06/16/23 1404 06/16/23 1952 06/16/23 2037  BP: 117/62 117/62 (!) 128/59 (!) 128/59  Pulse:   70 65  Resp:   17   Temp: 98.5 F (36.9 C) 98.5 F (36.9 C) 98.6 F (37 C)   TempSrc: Oral Oral Oral   SpO2:      Weight:      Height:        Intake/Output Summary (Last 24 hours) at 06/17/2023 0809 Last data filed at 06/17/2023 0742 Gross per 24 hour  Intake --  Output 90 ml  Net -90 ml      06/15/2023    7:40 AM 06/09/2023    9:00 AM 06/01/2023   10:09 AM  Last 3 Weights  Weight (lbs) 196 lb 206 lb 207 lb  Weight (kg) 88.905 kg 93.441 kg 93.895 kg      Telemetry/ECG    Sinus Rhythm, Sinus Bradycardia PVCs - Personally Reviewed  Physical Exam .   GEN: No acute distress.   Neck: No JVD Cardiac: RRR, no murmurs, rubs, or gallops.  Respiratory: Clear to auscultation bilaterally. GI: Soft, nontender, non-distended  MS: No edema, right BKA  Assessment & Plan .     67 y.o. male with PMH of hypertension, hyperlipidemia, PAD and carotid artery disease s/p R CEA on 09/05/2015 who presented for outpatient cardiac cath. Found to have severe LM disease, admitted with plans for CABG.   Unstable Angina CAD -- underwent outpatient cardiac cath with 95% ost/mLM stenosis, 1st diag lesion 50%. Seen by TCTS with need for plavix washout prior to surgery. Not officially posted for surgery yet. Echo showed LVEF of 60-65%, normal RV -- continue ASA, statin, metoprolol 12.5mg  BID   HLD -- LDL 70, HDL 50, LP (a) 147 -- continue atorvastatin 80mg  daily   HTN -- home ARB/hydrochlorothiazide held -- continue metoprolol 12.5mg  BID   PVCs -- improved  -- continue metoprolol 12.5mg  BID     Carotid artery disease S/p R CEA '17 -- carotid dopplers with 1-39% bilaterally -- on ASA, statin   PAD  S/p Right BKA -- Right below knee amputation in 2023 for critical limb ischemia. Recent angiography showed heavily calcified but patent left SFA and heavily calcified and diffusely diseased below knee popliteal artery with 80-90% stenosis at the level of the anterior tibial artery, treated with drug-coated balloon angioplasty -- on ASA, statin  For questions or updates, please contact [facility] Please consult [email or web address] for contact info under        Signed, Laverda Page, NP

## 2023-06-17 NOTE — Plan of Care (Signed)

## 2023-06-17 NOTE — Progress Notes (Signed)
Pt has been ambulating without CP. Prosthesis donned in room. Discussed with pt and wife IS (1600 ml currently), sternal precautions, mobility post op and dc planning. Their son is having AVR tomorrow therefore discussion also regarding wife attending to both pt and son. Pt practiced standing from chair following sternal precautions, which he could do with focus. Encouraged more practice in the next few days. Gave ed materials to view.  [id number] Ethelda Chick BS, ACSM-CEP 06/17/2023 2:58 PM

## 2023-06-17 NOTE — Plan of Care (Signed)
  Problem: Activity: Goal: Ability to return to baseline activity level will improve Outcome: Progressing   Problem: Health Behavior/Discharge Planning: Goal: Ability to safely manage health-related needs after discharge will improve Outcome: Progressing   Problem: Health Behavior/Discharge Planning: Goal: Ability to manage health-related needs will improve Outcome: Progressing   Problem: Clinical Measurements: Goal: Ability to maintain clinical measurements within normal limits will improve Outcome: Progressing Goal: Respiratory complications will improve Outcome: Progressing Goal: Cardiovascular complication will be avoided Outcome: Progressing   Problem: Activity: Goal: Risk for activity intolerance will decrease Outcome: Progressing   Problem: Nutrition: Goal: Adequate nutrition will be maintained Outcome: Progressing   Problem: Safety: Goal: Ability to remain free from injury will improve Outcome: Progressing

## 2023-06-18 DIAGNOSIS — I25119 Atherosclerotic heart disease of native coronary artery with unspecified angina pectoris: Secondary | ICD-10-CM | POA: Diagnosis not present

## 2023-06-18 LAB — HEPARIN LEVEL (UNFRACTIONATED): Heparin Unfractionated: 0.85 [IU]/mL — ABNORMAL HIGH (ref 0.30–0.70)

## 2023-06-18 MED ORDER — HEPARIN (PORCINE) 25000 UT/250ML-% IV SOLN
1050.0000 [IU]/h | INTRAVENOUS | Status: DC
  Administered 2023-06-18: 1200 [IU]/h via INTRAVENOUS
  Administered 2023-06-19 – 2023-06-23 (×6): 1050 [IU]/h via INTRAVENOUS
  Filled 2023-06-18 (×7): qty 250

## 2023-06-18 MED ORDER — HEPARIN BOLUS VIA INFUSION
4000.0000 [IU] | Freq: Once | INTRAVENOUS | Status: AC
  Administered 2023-06-18: 4000 [IU] via INTRAVENOUS
  Filled 2023-06-18: qty 4000

## 2023-06-18 NOTE — Progress Notes (Signed)
   Patient Name: Clinton [name] Date of Encounter: 06/18/2023 [facility] Cardiologist: Nanetta Batty, MD   Interval Summary  .    No complaints, son is having valve surgery with Dr. Laneta Simmers this morning. Wife at the bedside.   Vital Signs .    Vitals:   06/16/23 2037 06/17/23 1329 06/17/23 2100 06/18/23 0629  BP: (!) 128/59 125/60 (!) 133/56 (!) 143/58  Pulse: 65 (!) 57 (!) 57 (!) 55  Resp:  16 17 17   Temp:  98.8 F (37.1 C) 98.4 F (36.9 C) 98.3 F (36.8 C)  TempSrc:  Oral Oral Oral  SpO2:  100% 97% 98%  Weight:      Height:        Intake/Output Summary (Last 24 hours) at 06/18/2023 0757 Last data filed at 06/17/2023 0858 Gross per 24 hour  Intake 243 ml  Output --  Net 243 ml      06/15/2023    7:40 AM 06/09/2023    9:00 AM 06/01/2023   10:09 AM  Last 3 Weights  Weight (lbs) 196 lb 206 lb 207 lb  Weight (kg) 88.905 kg 93.441 kg 93.895 kg      Telemetry/ECG    Sinus Bradycardia, occ PVC - Personally Reviewed  Physical Exam .   GEN: No acute distress.   Neck: No JVD Cardiac: RRR, no murmurs, rubs, or gallops.  Respiratory: Clear to auscultation bilaterally. GI: Soft, nontender, non-distended  MS: Right BKA  Assessment & Plan .     67 y.o. male with PMH of hypertension, hyperlipidemia, PAD and carotid artery disease s/p R CEA on 09/05/2015 who presented for outpatient cardiac cath. Found to have severe LM disease, admitted with plans for CABG.   Unstable Angina CAD -- underwent outpatient cardiac cath with 95% ost/mLM stenosis, 1st diag lesion 50%. Seen by TCTS with need for plavix washout prior to surgery. Posted for 11/13. Echo showed LVEF of 60-65%, normal RV. No recurrent chest pain. Suspect he will need to remain inpatient given his disease, but will review with MD. 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 Recent angiography showed heavily calcified but patent left SFA and heavily calcified and diffusely diseased below knee popliteal artery with 80-90% stenosis at the level of the anterior tibial artery, treated with drug-coated balloon angioplasty -- on ASA, statin  For questions or updates, please contact  [facility] Please consult [email or web address] for contact info under        Signed, Laverda Page, NP

## 2023-06-18 NOTE — Progress Notes (Signed)
Mobility Specialist Progress Note:   06/18/23 1330  Mobility  Activity Ambulated with assistance in hallway  Level of Assistance Standby assist, set-up cues, supervision of patient - no hands on  Assistive Device None  Distance Ambulated (ft) 250 ft  Activity Response Tolerated well  Mobility Referral Yes  $Mobility charge 1 Mobility  Mobility Specialist Start Time (ACUTE ONLY) 1330  Mobility Specialist Stop Time (ACUTE ONLY) 1350  Mobility Specialist Time Calculation (min) (ACUTE ONLY) 20 min   Pt agreeable to mobility session. Required no physical assistance throughout ambulation. Pt left sitting in chair with all needs met, family in room.  Addison Lank Mobility Specialist Please contact via SecureChat or  Rehab office at [phone]

## 2023-06-18 NOTE — Progress Notes (Signed)
PHARMACY - ANTICOAGULATION CONSULT NOTE  Pharmacy Consult for heparin Indication: chest pain/ACS  Allergies  Allergen Reactions   Penicillins Rash    Has patient had a PCN reaction causing immediate rash, facial/tongue/throat swelling, SOB or lightheadedness with hypotension: Yes Has patient had a PCN reaction causing severe rash involving mucus membranes or skin necrosis: No Has patient had a PCN reaction that required hospitalization No Has patient had a PCN reaction occurring within the last 10 years: No If all of the above answers are "NO", then may proceed with Cephalosporin use.  Tolerated Cefazolin on 04/16/22    Sulfa Antibiotics Swelling and Other (See Comments)    Facial/lip swelling    Patient Measurements: Height: 5\' 10"  (177.8 cm) Weight: 88.9 kg (196 lb) IBW/kg (Calculated) : 73 HEPARIN DW (KG): 88.9   Vital Signs: Temp: 98.6 F (37 C) (11/07 1506) Temp Source: Oral (11/07 1506) BP: 130/61 (11/07 1506) Pulse Rate: 59 (11/07 1506)  Labs: Recent Labs    06/16/23 0843 06/18/23 1633  HEPARINUNFRC  --  0.85*  CREATININE 1.09  --     Estimated Creatinine Clearance: 72.8 mL/min (by C-G formula based on SCr of 1.09 mg/dL).   Medical History: Past Medical History:  Diagnosis Date   Carotid artery occlusion    Cellulitis    Erectile dysfunction    Hyperlipidemia    Hypertension    Peripheral vascular disease (HCC)     Medications:  Medications Prior to Admission  Medication Sig Dispense Refill Last Dose   amLODipine (NORVASC) 2.5 MG tablet Take 2.5 mg by mouth at bedtime.   06/14/2023   aspirin EC 81 MG tablet Take 81 mg by mouth at bedtime.   06/15/2023 at 0545   atorvastatin (LIPITOR) 80 MG tablet Take 80 mg by mouth at bedtime.   06/14/2023   clopidogrel (PLAVIX) 75 MG tablet Take 1 tablet (75 mg total) by mouth daily. 30 tablet 11 06/15/2023 at 0545   irbesartan-hydrochlorothiazide (AVALIDE) 300-12.5 MG tablet Take 1 tablet by mouth at bedtime.   Past  Week   Multiple Vitamin (MULTIVITAMIN) capsule Take 2 capsules by mouth every morning. Gummy   06/15/2023   nitroGLYCERIN (NITROSTAT) 0.4 MG SL tablet Place 1 tablet (0.4 mg total) under the tongue every 5 (five) minutes as needed for chest pain. 25 tablet 3 Past Week   Omega-3 Fatty Acids (OMEGA 3 PO) Take 2 capsules by mouth daily. 454 mg each   06/15/2023   Probiotic Product (PROBIOTIC PO) Take 220 mg by mouth daily.   06/15/2023   vitamin C (ASCORBIC ACID) 250 MG tablet Take 500 mg by mouth daily.   06/15/2023   zinc sulfate 220 (50 Zn) MG capsule Take 1 capsule (220 mg total) by mouth daily.   06/15/2023   terbinafine (LAMISIL) 250 MG tablet Take 250 mg by mouth daily.   More than a month   Scheduled:   amLODipine  2.5 mg Oral Daily   aspirin EC  81 mg Oral Daily   atorvastatin  80 mg Oral Daily   ezetimibe  10 mg Oral Daily   metoprolol tartrate  12.5 mg Oral BID   sodium chloride flush  3 mL Intravenous Q12H    Assessment: 67 yo male s/p cath 11/4 with LM disease. Plans are for CABG on 06/24/23 -hg= 13, plt= 219  UPDATE:    The 6 hour heparin level = 0.85, drawn ~ 30 minutes early is a supratherapeutic level on heparin 1200 units/hr and after initial  bolus of 4000 units.   No bleeding reported per RN.  Will decrease heparin and check 6 hour heparin level.   Goal of Therapy:  Heparin level 0.3-0.7 units/ml Monitor platelets by anticoagulation protocol: Yes   Plan:  -Decrease Heparin  infusion to1050 units/hr -Heparin level in 6 hours and daily wth CBC daily   Noah Delaine, RPh Clinical Pharmacist **Pharmacist phone directory can now be found on [email or web address] (PW [email or web address]).  Listed under [facility].

## 2023-06-18 NOTE — Care Management (Signed)
Transition of Care [facility]) - Inpatient Brief Assessment   Patient Details  Name: Clinton [name] MRN: [id number] Date of Birth: [birth year]  Transition of [facility]) CM/SW Contact:    Lockie Pares, RN Phone Number: 06/18/2023, 11:57 AM   Clinical Narrative:  67 year old patient here for plaxvix washout, on heparin. For CV surgery wed. Son is going in for AVR today. Patient has a previous amputation with prosthetic. Has been educated on sternal precautions IS. And is practicing these items.  PT will re-evaluate post surgery for any needs.   TOC will continue to follow Transition of Care Asessment: Insurance and Status: Insurance coverage has been reviewed Patient has primary care physician: Yes Home environment has been reviewed: Lives with Wife Prior level of function:: Independent with prosthesis Prior/Current Home Services: No current home services Social Determinants of Health Reivew: SDOH reviewed no interventions necessary Readmission risk has been reviewed: Yes Transition of care needs: transition of care needs identified, TOC will continue to follow

## 2023-06-18 NOTE — Progress Notes (Signed)
PHARMACY - ANTICOAGULATION CONSULT NOTE  Pharmacy Consult for heparin Indication: chest pain/ACS  Allergies  Allergen Reactions   Penicillins Rash    Has patient had a PCN reaction causing immediate rash, facial/tongue/throat swelling, SOB or lightheadedness with hypotension: Yes Has patient had a PCN reaction causing severe rash involving mucus membranes or skin necrosis: No Has patient had a PCN reaction that required hospitalization No Has patient had a PCN reaction occurring within the last 10 years: No If all of the above answers are "NO", then may proceed with Cephalosporin use.  Tolerated Cefazolin on 04/16/22    Sulfa Antibiotics Swelling and Other (See Comments)    Facial/lip swelling    Patient Measurements: Height: 5\' 10"  (177.8 cm) Weight: 88.9 kg (196 lb) IBW/kg (Calculated) : 73 HEPARIN DW (KG): 88.9   Vital Signs: Temp: 98.8 F (37.1 C) (11/07 0838) Temp Source: Oral (11/07 0838) BP: 143/58 (11/07 0629) Pulse Rate: 62 (11/07 0838)  Labs: Recent Labs    06/15/23 1236 06/16/23 0843  HGB 13.0  --   HCT 38.5*  --   PLT 219  --   CREATININE 1.10 1.09    Estimated Creatinine Clearance: 72.8 mL/min (by C-G formula based on SCr of 1.09 mg/dL).   Medical History: Past Medical History:  Diagnosis Date   Carotid artery occlusion    Cellulitis    Erectile dysfunction    Hyperlipidemia    Hypertension    Peripheral vascular disease (HCC)     Medications:  Medications Prior to Admission  Medication Sig Dispense Refill Last Dose   amLODipine (NORVASC) 2.5 MG tablet Take 2.5 mg by mouth at bedtime.   06/14/2023   aspirin EC 81 MG tablet Take 81 mg by mouth at bedtime.   06/15/2023 at 0545   atorvastatin (LIPITOR) 80 MG tablet Take 80 mg by mouth at bedtime.   06/14/2023   clopidogrel (PLAVIX) 75 MG tablet Take 1 tablet (75 mg total) by mouth daily. 30 tablet 11 06/15/2023 at 0545   irbesartan-hydrochlorothiazide (AVALIDE) 300-12.5 MG tablet Take 1 tablet by  mouth at bedtime.   Past Week   Multiple Vitamin (MULTIVITAMIN) capsule Take 2 capsules by mouth every morning. Gummy   06/15/2023   nitroGLYCERIN (NITROSTAT) 0.4 MG SL tablet Place 1 tablet (0.4 mg total) under the tongue every 5 (five) minutes as needed for chest pain. 25 tablet 3 Past Week   Omega-3 Fatty Acids (OMEGA 3 PO) Take 2 capsules by mouth daily. 454 mg each   06/15/2023   Probiotic Product (PROBIOTIC PO) Take 220 mg by mouth daily.   06/15/2023   vitamin C (ASCORBIC ACID) 250 MG tablet Take 500 mg by mouth daily.   06/15/2023   zinc sulfate 220 (50 Zn) MG capsule Take 1 capsule (220 mg total) by mouth daily.   06/15/2023   terbinafine (LAMISIL) 250 MG tablet Take 250 mg by mouth daily.   More than a month   Scheduled:   amLODipine  2.5 mg Oral Daily   aspirin EC  81 mg Oral Daily   atorvastatin  80 mg Oral Daily   enoxaparin (LOVENOX) injection  40 mg Subcutaneous QHS   ezetimibe  10 mg Oral Daily   metoprolol tartrate  12.5 mg Oral BID   sodium chloride flush  3 mL Intravenous Q12H    Assessment: 67 yo male s/p cath 11/4 with LM disease. Plans are for CABG on 06/24/23 -hg= 13, plt= 219  Goal of Therapy:  Heparin level 0.3-0.7  units/ml Monitor platelets by anticoagulation protocol: Yes   Plan:  -heparin bolus 4000 units x1 (full bolus since cath was on 11/4)  then start infusion at 1200 units/hr -Heparin level in 6 hours and daily wth CBC daily  Harland German, PharmD Clinical Pharmacist **Pharmacist phone directory can now be found on [email or web address] (PW [email or web address]).  Listed under [facility].

## 2023-06-18 NOTE — Plan of Care (Signed)
  Problem: Cardiovascular: Goal: Ability to achieve and maintain adequate cardiovascular perfusion will improve Outcome: Progressing Goal: Vascular access site(s) Level 0-1 will be maintained Outcome: Completed/Met

## 2023-06-18 NOTE — Progress Notes (Signed)
Just walked with MS.

## 2023-06-18 NOTE — Plan of Care (Signed)

## 2023-06-18 NOTE — Plan of Care (Signed)
  Problem: Activity: Goal: Ability to return to baseline activity level will improve Outcome: Progressing   Problem: Cardiovascular: Goal: Ability to achieve and maintain adequate cardiovascular perfusion will improve Outcome: Progressing Goal: Vascular access site(s) Level 0-1 will be maintained Outcome: Progressing   Problem: Clinical Measurements: Goal: Will remain free from infection Outcome: Progressing Goal: Respiratory complications will improve Outcome: Progressing Goal: Cardiovascular complication will be avoided Outcome: Progressing   Problem: Activity: Goal: Risk for activity intolerance will decrease Outcome: Progressing   Problem: Nutrition: Goal: Adequate nutrition will be maintained Outcome: Progressing   Problem: Skin Integrity: Goal: Risk for impaired skin integrity will decrease Outcome: Progressing

## 2023-06-19 DIAGNOSIS — I25119 Atherosclerotic heart disease of native coronary artery with unspecified angina pectoris: Secondary | ICD-10-CM | POA: Diagnosis not present

## 2023-06-19 DIAGNOSIS — I739 Peripheral vascular disease, unspecified: Secondary | ICD-10-CM | POA: Diagnosis not present

## 2023-06-19 DIAGNOSIS — E7841 Elevated Lipoprotein(a): Secondary | ICD-10-CM | POA: Diagnosis not present

## 2023-06-19 LAB — BASIC METABOLIC PANEL
Anion gap: 3 — ABNORMAL LOW (ref 5–15)
BUN: 16 mg/dL (ref 8–23)
CO2: 27 mmol/L (ref 22–32)
Calcium: 8.6 mg/dL — ABNORMAL LOW (ref 8.9–10.3)
Chloride: 109 mmol/L (ref 98–111)
Creatinine, Ser: 1.26 mg/dL — ABNORMAL HIGH (ref 0.61–1.24)
GFR, Estimated: >60 mL/min (ref >60)
Glucose, Bld: 95 mg/dL (ref 70–99)
Potassium: 4.4 mmol/L (ref 3.5–5.1)
Sodium: 139 mmol/L (ref 135–145)

## 2023-06-19 LAB — CBC
HCT: 36 % — ABNORMAL LOW (ref 39.0–52.0)
Hemoglobin: 12 g/dL — ABNORMAL LOW (ref 13.0–17.0)
MCH: 31.8 pg (ref 26.0–34.0)
MCHC: 33.3 g/dL (ref 30.0–36.0)
MCV: 95.5 fL (ref 80.0–100.0)
Platelets: 219 10*3/uL (ref 150–400)
RBC: 3.77 MIL/uL — ABNORMAL LOW (ref 4.22–5.81)
RDW: 12.3 % (ref 11.5–15.5)
WBC: 5.9 10*3/uL (ref 4.0–10.5)
nRBC: 0 % (ref 0.0–0.2)

## 2023-06-19 LAB — HEPARIN LEVEL (UNFRACTIONATED)
Heparin Unfractionated: 0.46 [IU]/mL (ref 0.30–0.70)
Heparin Unfractionated: 0.51 [IU]/mL (ref 0.30–0.70)

## 2023-06-19 MED ORDER — ATORVASTATIN CALCIUM 40 MG PO TABS
40.0000 mg | ORAL_TABLET | Freq: Every day | ORAL | Status: DC
  Administered 2023-06-20 – 2023-06-22 (×3): 40 mg via ORAL
  Filled 2023-06-19 (×3): qty 1

## 2023-06-19 NOTE — Progress Notes (Signed)
PHARMACY - ANTICOAGULATION CONSULT NOTE  Pharmacy Consult for heparin Indication: chest pain/ACS  Allergies  Allergen Reactions   Penicillins Rash    Has patient had a PCN reaction causing immediate rash, facial/tongue/throat swelling, SOB or lightheadedness with hypotension: Yes Has patient had a PCN reaction causing severe rash involving mucus membranes or skin necrosis: No Has patient had a PCN reaction that required hospitalization No Has patient had a PCN reaction occurring within the last 10 years: No If all of the above answers are "NO", then may proceed with Cephalosporin use.  Tolerated Cefazolin on 04/16/22    Sulfa Antibiotics Swelling and Other (See Comments)    Facial/lip swelling    Patient Measurements: Height: 5\' 10"  (177.8 cm) Weight: 88.9 kg (196 lb) IBW/kg (Calculated) : 73 HEPARIN DW (KG): 88.9   Vital Signs: Temp: 98.6 F (37 C) (11/08 0700) Temp Source: Oral (11/08 0700) BP: 141/69 (11/08 0700) Pulse Rate: 55 (11/08 0331)  Labs: Recent Labs    06/18/23 1633 06/19/23 0330 06/19/23 0947  HGB  --  12.0*  --   HCT  --  36.0*  --   PLT  --  219  --   HEPARINUNFRC 0.85* 0.46 0.51  CREATININE  --  1.26*  --     Estimated Creatinine Clearance: 63 mL/min (A) (by C-G formula based on SCr of 1.26 mg/dL (H)).   Medical History: Past Medical History:  Diagnosis Date   Carotid artery occlusion    Cellulitis    Erectile dysfunction    Hyperlipidemia    Hypertension    Peripheral vascular disease (HCC)     Medications:  Medications Prior to Admission  Medication Sig Dispense Refill Last Dose   amLODipine (NORVASC) 2.5 MG tablet Take 2.5 mg by mouth at bedtime.   06/14/2023   aspirin EC 81 MG tablet Take 81 mg by mouth at bedtime.   06/15/2023 at 0545   atorvastatin (LIPITOR) 80 MG tablet Take 80 mg by mouth at bedtime.   06/14/2023   clopidogrel (PLAVIX) 75 MG tablet Take 1 tablet (75 mg total) by mouth daily. 30 tablet 11 06/15/2023 at 0545    irbesartan-hydrochlorothiazide (AVALIDE) 300-12.5 MG tablet Take 1 tablet by mouth at bedtime.   Past Week   Multiple Vitamin (MULTIVITAMIN) capsule Take 2 capsules by mouth every morning. Gummy   06/15/2023   nitroGLYCERIN (NITROSTAT) 0.4 MG SL tablet Place 1 tablet (0.4 mg total) under the tongue every 5 (five) minutes as needed for chest pain. 25 tablet 3 Past Week   Omega-3 Fatty Acids (OMEGA 3 PO) Take 2 capsules by mouth daily. 454 mg each   06/15/2023   Probiotic Product (PROBIOTIC PO) Take 220 mg by mouth daily.   06/15/2023   vitamin C (ASCORBIC ACID) 250 MG tablet Take 500 mg by mouth daily.   06/15/2023   zinc sulfate 220 (50 Zn) MG capsule Take 1 capsule (220 mg total) by mouth daily.   06/15/2023   terbinafine (LAMISIL) 250 MG tablet Take 250 mg by mouth daily.   More than a month   Scheduled:   amLODipine  2.5 mg Oral Daily   aspirin EC  81 mg Oral Daily   atorvastatin  80 mg Oral Daily   ezetimibe  10 mg Oral Daily   metoprolol tartrate  12.5 mg Oral BID   sodium chloride flush  3 mL Intravenous Q12H    Assessment: 67 yo male s/p cath 11/4 with LM disease. Plans are for CABG on  06/24/23  -heparin level= 0.51 on 1050 units/hr, hg= 13   Goal of Therapy:  Heparin level 0.3-0.7 units/ml Monitor platelets by anticoagulation protocol: Yes   Plan:  -Continue heparin at 1050 units/hr -Heparin level daily wth CBC daily  Harland German, PharmD Clinical Pharmacist **Pharmacist phone directory can now be found on [email or web address] ([email or web address]).  Listed under [facility].

## 2023-06-19 NOTE — Plan of Care (Signed)
  Problem: Education: Goal: Understanding of CV disease, CV risk reduction, and recovery process will improve Outcome: Progressing   Problem: Cardiovascular: Goal: Ability to achieve and maintain adequate cardiovascular perfusion will improve Outcome: Progressing   Problem: Education: Goal: Knowledge of General Education information will improve Description: Including pain rating scale, medication(s)/side effects and non-pharmacologic comfort measures Outcome: Progressing   Problem: Clinical Measurements: Goal: Will remain free from infection Outcome: Progressing

## 2023-06-19 NOTE — Progress Notes (Signed)
   06/19/23 1332  Mobility  Activity Ambulated independently in hallway  Level of Assistance Standby assist, set-up cues, supervision of patient - no hands on  Assistive Device None  Distance Ambulated (ft) 250 ft  Activity Response Tolerated well  Mobility Referral Yes  $Mobility charge 1 Mobility  Mobility Specialist Start Time (ACUTE ONLY) 1314  Mobility Specialist Stop Time (ACUTE ONLY) 1332  Mobility Specialist Time Calculation (min) (ACUTE ONLY) 18 min   Mobility Specialist: Progress Note  Pt agreeable to mobility session - received in chair. Required SV with no AD. Pt was asymptomatic throughout session with no complaints. Returned to chair with all needs met - call bell within reach. Wife present.   Barnie Mort, BS Mobility Specialist Please contact via SecureChat or Rehab office at [phone].

## 2023-06-19 NOTE — Progress Notes (Signed)
PHARMACY - ANTICOAGULATION CONSULT NOTE  Pharmacy Consult for heparin Indication:  CAD awaiting CABG  Labs: Recent Labs    06/16/23 0843 06/18/23 1633 06/19/23 0330  HGB  --   --  12.0*  HCT  --   --  36.0*  PLT  --   --  219  HEPARINUNFRC  --  0.85* 0.46  CREATININE 1.09  --  1.26*    Assessment/Plan:  67yo male therapeutic on heparin after rate change. Will continue infusion at current rate of 1050 units/hr and confirm stable with additional level.  Vernard Gambles, PharmD, BCPS 06/19/2023 4:29 AM

## 2023-06-19 NOTE — Progress Notes (Signed)
CARDIAC REHAB PHASE I   PRE:  Rate/Rhythm: 90 sR with PVCs at sink    BP: sitting     SpO2:   MODE:  Ambulation: 350 ft   POST:  Rate/Rhythm: 103 ST with increased PVCs    BP: sitting 137/68     SpO2: 97 RA  Pt up at sink brushing teeth. Noted PVCs with activity, none with rest. No CP noted. Pt able to ambulate without assist, steady with prosthesis. Practicing IS. 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  [id number]   Ethelda Chick BS, ACSM-CEP 06/19/2023 9:44 AM

## 2023-06-19 NOTE — Progress Notes (Addendum)
Patient Name: Clinton [name] Date of Encounter: 06/19/2023 [facility] Cardiologist: Nanetta Batty, MD   Interval Summary  .    No complaints, awaiting CABG  I had extensive discussion with the patient and wife regarding their complicated cardiovascular history  Patient has had PAD leading to right BKA, has also had PTCA in left lower extremity, prior history of right carotid endarterectomy.  Patient is nondiabetic, non-smoker, exercises all his life, with very well-controlled LDL historically.  He is now found to have elevated lipoprotein a levels.  Patient's father died of a fatal MI at age 61, patient's brother has undergone CABG in 72s. Seemingly unrelated, patient's 1 son just underwent aortic valve replacement and ascending aorta repair at age 17, other son has LV noncompaction at age 75 with identified gene mutation and gene MYH7.  2 grandsons have the same gene mutation, and 1 grandson died at age 57 weeks due to severe pulmonary hypertension and tricuspid regurgitation.  They have been seen and screened by Dr. Sidney Ace. Patient and his wife do not have MYH7 gene mutation.  Vital Signs .    Vitals:   06/18/23 2100 06/18/23 2105 06/19/23 0331 06/19/23 0700  BP: 131/61 131/61 (!) 134/58 (!) 141/69  Pulse: (!) 57 (!) 57 (!) 55   Resp: 16  17 16   Temp:   98.3 F (36.8 C) 98.6 F (37 C)  TempSrc:   Oral Oral  SpO2: 97%  96%   Weight:      Height:        Intake/Output Summary (Last 24 hours) at 06/19/2023 1106 Last data filed at 06/19/2023 0900 Gross per 24 hour  Intake 364.5 ml  Output --  Net 364.5 ml      06/15/2023    7:40 AM 06/09/2023    9:00 AM 06/01/2023   10:09 AM  Last 3 Weights  Weight (lbs) 196 lb 206 lb 207 lb  Weight (kg) 88.905 kg 93.441 kg 93.895 kg      Telemetry/ECG    06/19/2023 telemetry- Personally Reviewed No arrhythmia  Physical Exam .   Physical Exam Vitals and nursing note reviewed.  Constitutional:      General: He  is not in acute distress. Neck:     Vascular: No JVD.  Cardiovascular:     Rate and Rhythm: Normal rate and regular rhythm.     Pulses:          Dorsalis pedis pulses are 0 on the left side.       Posterior tibial pulses are 0 on the left side.     Heart sounds: Normal heart sounds. No murmur heard.    Comments: Right BKA  Pulmonary:     Effort: Pulmonary effort is normal.     Breath sounds: Normal breath sounds. No wheezing or rales.  Musculoskeletal:     Right lower leg: No edema.     Left lower leg: No edema.      Assessment & Plan .     67 y/o male with hypertension, hyperlipidemia, PAD s/p Rt BKA, LLE PTCA, rt CEA, CAD with unstable angina, 95% ostial LM stenosis, elevated lipoprotein a  CAD: Presentation with unstable angina due to 95% ostial left main stenosis. Awaiting CABG, scheduled for 06/24/2023. Currently chest pain-free. Continue aspirin, heparin. Currently on Lipitor 80 mg daily.  Historically, he had been on Lipitor 40 mg daily with LDL 58-70. With new finding of elevated lipoprotein a, I recommend reducing Lipitor back down  to 40 mg daily, with consideration for PCSK9 inhibitor therapy outpatient, and possibly participation in lipoprotein a clinical trials.  I had a long discussion with patient and wife regarding patient's advanced CAD and PAD, as well as their complex cardiovascular family history. It is very likely that elevated lipoprotein a is the #1 risk factor for his advanced CAD and PAD.  I have encouraged other family members to screen for elevated lipoprotein a.  I explained to the patient that there is no approved primetime therapy targeted at lipoprotein reduction, other than plasmapheresis, but several clinical trials are ongoing with promising all results.  PAD: S/p right BKA. No critical limb ischemia in left lower extremity. Continue aspirin, see above regarding lipid-lowering therapy. He was on irbesartan-hydrochlorothiazide 300-12.5 mg  outpatient. Currently holding pending CABG. He should be on some ARB outpatient after discharge. If BP on the lower side, low dose ARB alone would suffice.   Mixed hyperlipidemia, elevated lipoprotein (a): As above  Patient sees Dr. Allyson Sabal and [name].  Patient's son with aortic valve replacement and ascending aorta repair sees Dr. Laneta Simmers. Patient's son Durwin Glaze with LV noncompaction sees Dr. Gala Romney.  For questions or updates, please contact [facility] Please consult [email or web address] for contact info under        Signed, Elder Negus, MD

## 2023-06-20 DIAGNOSIS — I25119 Atherosclerotic heart disease of native coronary artery with unspecified angina pectoris: Secondary | ICD-10-CM | POA: Diagnosis not present

## 2023-06-20 LAB — CBC
HCT: 34.9 % — ABNORMAL LOW (ref 39.0–52.0)
Hemoglobin: 11.6 g/dL — ABNORMAL LOW (ref 13.0–17.0)
MCH: 32.1 pg (ref 26.0–34.0)
MCHC: 33.2 g/dL (ref 30.0–36.0)
MCV: 96.7 fL (ref 80.0–100.0)
Platelets: 213 K/uL (ref 150–400)
RBC: 3.61 MIL/uL — ABNORMAL LOW (ref 4.22–5.81)
RDW: 12.5 % (ref 11.5–15.5)
WBC: 5.7 K/uL (ref 4.0–10.5)
nRBC: 0 % (ref 0.0–0.2)

## 2023-06-20 LAB — HEPARIN LEVEL (UNFRACTIONATED): Heparin Unfractionated: 0.5 [IU]/mL (ref 0.30–0.70)

## 2023-06-20 NOTE — Progress Notes (Signed)
PHARMACY - ANTICOAGULATION CONSULT NOTE  Pharmacy Consult for heparin Indication: chest pain/ACS  Allergies  Allergen Reactions   Penicillins Rash    Has patient had a PCN reaction causing immediate rash, facial/tongue/throat swelling, SOB or lightheadedness with hypotension: Yes Has patient had a PCN reaction causing severe rash involving mucus membranes or skin necrosis: No Has patient had a PCN reaction that required hospitalization No Has patient had a PCN reaction occurring within the last 10 years: No If all of the above answers are "NO", then may proceed with Cephalosporin use.  Tolerated Cefazolin on 04/16/22    Sulfa Antibiotics Swelling and Other (See Comments)    Facial/lip swelling    Patient Measurements: Height: 5\' 10"  (177.8 cm) Weight: 88.9 kg (196 lb) IBW/kg (Calculated) : 73 HEPARIN DW (KG): 88.9   Vital Signs: Temp: 97.9 F (36.6 C) (11/09 0439) Temp Source: Oral (11/09 0439) BP: 134/78 (11/09 0900) Pulse Rate: 56 (11/09 0439)  Labs: Recent Labs    06/19/23 0330 06/19/23 0947 06/20/23 0334  HGB 12.0*  --  11.6*  HCT 36.0*  --  34.9*  PLT 219  --  213  HEPARINUNFRC 0.46 0.51 0.50  CREATININE 1.26*  --   --     Estimated Creatinine Clearance: 63 mL/min (A) (by C-G formula based on SCr of 1.26 mg/dL (H)).   Medical History: Past Medical History:  Diagnosis Date   Carotid artery occlusion    Cellulitis    Erectile dysfunction    Hyperlipidemia    Hypertension    Peripheral vascular disease (HCC)     Medications:  Medications Prior to Admission  Medication Sig Dispense Refill Last Dose   amLODipine (NORVASC) 2.5 MG tablet Take 2.5 mg by mouth at bedtime.   06/14/2023   aspirin EC 81 MG tablet Take 81 mg by mouth at bedtime.   06/15/2023 at 0545   atorvastatin (LIPITOR) 80 MG tablet Take 80 mg by mouth at bedtime.   06/14/2023   clopidogrel (PLAVIX) 75 MG tablet Take 1 tablet (75 mg total) by mouth daily. 30 tablet 11 06/15/2023 at 0545    irbesartan-hydrochlorothiazide (AVALIDE) 300-12.5 MG tablet Take 1 tablet by mouth at bedtime.   Past Week   Multiple Vitamin (MULTIVITAMIN) capsule Take 2 capsules by mouth every morning. Gummy   06/15/2023   nitroGLYCERIN (NITROSTAT) 0.4 MG SL tablet Place 1 tablet (0.4 mg total) under the tongue every 5 (five) minutes as needed for chest pain. 25 tablet 3 Past Week   Omega-3 Fatty Acids (OMEGA 3 PO) Take 2 capsules by mouth daily. 454 mg each   06/15/2023   Probiotic Product (PROBIOTIC PO) Take 220 mg by mouth daily.   06/15/2023   vitamin C (ASCORBIC ACID) 250 MG tablet Take 500 mg by mouth daily.   06/15/2023   zinc sulfate 220 (50 Zn) MG capsule Take 1 capsule (220 mg total) by mouth daily.   06/15/2023   terbinafine (LAMISIL) 250 MG tablet Take 250 mg by mouth daily.   More than a month   Scheduled:   amLODipine  2.5 mg Oral Daily   aspirin EC  81 mg Oral Daily   atorvastatin  40 mg Oral Daily   ezetimibe  10 mg Oral Daily   metoprolol tartrate  12.5 mg Oral BID   sodium chloride flush  3 mL Intravenous Q12H    Assessment: 67 yo male s/p cath 11/4 with LM disease. Plans are for CABG on 06/24/23  11/9 AM: HL 0.50,  therapeutic on 1050 units/hr. No signs of bleeding noted or issues with infusion running per RN. CBC stable ( hgb 11.6, Plt 213).    Goal of Therapy:  Heparin level 0.3-0.7 units/ml Monitor platelets by anticoagulation protocol: Yes   Plan:  -Continue heparin at 1050 units/hr -Heparin level daily wth CBC daily  Enos Fling, PharmD PGY-1 Acute Care Pharmacy Resident 06/20/2023 12:00 PM

## 2023-06-20 NOTE — Progress Notes (Addendum)
CARDIAC REHAB PHASE I   PRE:  Rate/Rhythm: 68 SR w/ PVC  BP:  Sitting: 134/78   MODE:  Ambulation: 350 ft   POST:  Rate/Rhythm: 84 SR w/ PVC  BP:  Sitting: 144/62      SaO2: 97 RA  Pt tolerated exercise well and amb 350 ft independently. Pt denies CP, SOB, or dizziness throughout walk. Pt had positive attitude towards surgery. Will continue to follow.  [id number] Joya San, MS, ACSM-CEP 06/20/2023 9:08 AM

## 2023-06-20 NOTE — Plan of Care (Signed)
°  Problem: Activity: °Goal: Ability to return to baseline activity level will improve °Outcome: Progressing °  °Problem: Cardiovascular: °Goal: Ability to achieve and maintain adequate cardiovascular perfusion will improve °Outcome: Progressing °  °

## 2023-06-20 NOTE — Progress Notes (Signed)
Mobility Specialist Progress Note:   06/20/23 1336  Mobility  Activity Ambulated with assistance in hallway  Level of Assistance Standby assist, set-up cues, supervision of patient - no hands on  Assistive Device None  Distance Ambulated (ft) 350 ft  Activity Response Tolerated well  Mobility Referral Yes  $Mobility charge 1 Mobility  Mobility Specialist Start Time (ACUTE ONLY) 1230  Mobility Specialist Stop Time (ACUTE ONLY) 1240  Mobility Specialist Time Calculation (min) (ACUTE ONLY) 10 min   Pre Mobility: 64  HR , 124/57 BP  During Mobility: 79 HR  Post Mobility: 59 HR   Pt received in chair, agreeable to mobility. No hands on needed during ambulation. VSS. Pt c/o lower back pain after session, otherwise asymptomatic throughout. Pt returned to chair with all needs met.  Leory Plowman  Mobility Specialist Please contact via [employer] office at [phone]

## 2023-06-20 NOTE — Progress Notes (Signed)
   Patient Name: Clinton [name] Date of Encounter: 06/20/2023 [facility] Cardiologist: Nanetta Batty, MD   Interval Summary  .    Patient still awaiting planned CABG next week.  No acute overnight events.  Patient reports feeling relatively well.  He has ambulated in the halls.  He is often sitting up in the chair at bedside.  Trying to spend little time in bed.  No new or acute complaints.  Vital Signs .    Vitals:   06/19/23 1952 06/19/23 2115 06/20/23 0439 06/20/23 0900  BP: (!) 120/56 (!) 122/54 (!) 128/52 134/78  Pulse: (!) 57 (!) 58 (!) 56   Resp: 18 20 16    Temp: 98.4 F (36.9 C)  97.9 F (36.6 C)   TempSrc: Oral  Oral   SpO2: 97% 98% 97%   Weight:      Height:        Intake/Output Summary (Last 24 hours) at 06/20/2023 1301 Last data filed at 06/20/2023 0843 Gross per 24 hour  Intake --  Output 300 ml  Net -300 ml      06/15/2023    7:40 AM 06/09/2023    9:00 AM 06/01/2023   10:09 AM  Last 3 Weights  Weight (lbs) 196 lb 206 lb 207 lb  Weight (kg) 88.905 kg 93.441 kg 93.895 kg      Telemetry/ECG    06/20/2023 telemetry- Personally Reviewed PVCs.  No sustained arrhythmias.    Physical Exam .   General: Well-developed, in no acute distress. Cardiac: Normal rate and regular rhythm. Respiratory: Normal work of breathing.  Lungs clear to auscultation. Extremities.  Status post right BKA.  No edema. Neuro: ANO x 3, no gross focal deficits.   Assessment & Plan .     Mr. Clinton [name] is a 67 y/o male with hypertension, hyperlipidemia, PAD s/p Rt BKA, LLE PTCA, rt CEA, CAD who presented with unstable angina and was found to have 95% ostial LM stenosis, elevated lipoprotein a.  He is awaiting planned CABG on 06/24/2023.  #. Unstable Angina:  #. CAD: 95% ostial left main stenosis. Awaiting CABG, scheduled for 06/24/2023. Currently chest pain-free. Continue aspirin, heparin. With new finding of elevated lipoprotein a, plans are for Lipitor 40 mg  daily, with consideration for PCSK9 inhibitor therapy outpatient, and possibly participation in lipoprotein a clinical trials.  #PAD:  S/p right BKA. No critical limb ischemia in left lower extremity. Continue aspirin, see above regarding lipid-lowering therapy. He was on irbesartan-hydrochlorothiazide 300-12.5 mg outpatient. Currently holding pending CABG. He should be on some ARB outpatient after discharge. If BP on the lower side, low dose ARB alone would suffice.   #Mixed hyperlipidemia, elevated lipoprotein (a): As above   For questions or updates, please contact [facility] Please consult [email or web address] for contact info under        Signed, Nobie Putnam, MD

## 2023-06-20 NOTE — Plan of Care (Signed)
  Problem: Cardiovascular: Goal: Ability to achieve and maintain adequate cardiovascular perfusion will improve Outcome: Progressing   Problem: Activity: Goal: Risk for activity intolerance will decrease Outcome: Progressing   Problem: Coping: Goal: Level of anxiety will decrease Outcome: Progressing

## 2023-06-21 DIAGNOSIS — I25119 Atherosclerotic heart disease of native coronary artery with unspecified angina pectoris: Secondary | ICD-10-CM | POA: Diagnosis not present

## 2023-06-21 LAB — CBC
HCT: 33.9 % — ABNORMAL LOW (ref 39.0–52.0)
Hemoglobin: 11.2 g/dL — ABNORMAL LOW (ref 13.0–17.0)
MCH: 31.7 pg (ref 26.0–34.0)
MCHC: 33 g/dL (ref 30.0–36.0)
MCV: 96 fL (ref 80.0–100.0)
Platelets: 206 10*3/uL (ref 150–400)
RBC: 3.53 MIL/uL — ABNORMAL LOW (ref 4.22–5.81)
RDW: 12.5 % (ref 11.5–15.5)
WBC: 5 10*3/uL (ref 4.0–10.5)
nRBC: 0 % (ref 0.0–0.2)

## 2023-06-21 LAB — HEPARIN LEVEL (UNFRACTIONATED): Heparin Unfractionated: 0.37 [IU]/mL (ref 0.30–0.70)

## 2023-06-21 NOTE — Plan of Care (Signed)
  Problem: Education: Goal: Understanding of CV disease, CV risk reduction, and recovery process will improve Outcome: Progressing Goal: Individualized Educational Video(s) Outcome: Progressing   Problem: Activity: Goal: Ability to return to baseline activity level will improve Outcome: Progressing   Problem: Cardiovascular: Goal: Ability to achieve and maintain adequate cardiovascular perfusion will improve Outcome: Progressing   Problem: Health Behavior/Discharge Planning: Goal: Ability to safely manage health-related needs after discharge will improve Outcome: Progressing   Problem: Education: Goal: Knowledge of General Education information will improve Description: Including pain rating scale, medication(s)/side effects and non-pharmacologic comfort measures Outcome: Progressing   Problem: Health Behavior/Discharge Planning: Goal: Ability to manage health-related needs will improve Outcome: Progressing   Problem: Clinical Measurements: Goal: Ability to maintain clinical measurements within normal limits will improve Outcome: Progressing Goal: Will remain free from infection Outcome: Progressing Goal: Diagnostic test results will improve Outcome: Progressing Goal: Respiratory complications will improve Outcome: Progressing Goal: Cardiovascular complication will be avoided Outcome: Progressing   Problem: Activity: Goal: Risk for activity intolerance will decrease Outcome: Progressing   Problem: Nutrition: Goal: Adequate nutrition will be maintained Outcome: Progressing   Problem: Coping: Goal: Level of anxiety will decrease Outcome: Progressing   Problem: Pain Management: Goal: General experience of comfort will improve Outcome: Progressing   Problem: Elimination: Goal: Will not experience complications related to bowel motility Outcome: Progressing Goal: Will not experience complications related to urinary retention Outcome: Progressing   Problem:  Safety: Goal: Ability to remain free from injury will improve Outcome: Progressing   Problem: Skin Integrity: Goal: Risk for impaired skin integrity will decrease Outcome: Progressing

## 2023-06-21 NOTE — Progress Notes (Signed)
   Patient Name: Clinton [name] Date of Encounter: 06/21/2023 [facility] Cardiologist: Nanetta Batty, MD   Interval Summary  .    Patient still awaiting planned CABG next week.  No acute overnight events.  Continues to report feeling well.  He continues to ambulate in the halls frequently.  He has no new or acute complaints today.  Vital Signs .    Vitals:   06/21/23 0430 06/21/23 0858 06/21/23 1209 06/21/23 1420  BP: 122/61 (!) 141/71 (!) 103/48 (!) 130/48  Pulse: (!) 56 67 (!) 55 (!) 103  Resp: 14  16   Temp: 97.7 F (36.5 C)  98.2 F (36.8 C)   TempSrc: Oral  Oral   SpO2: 99% 97% 98% 93%  Weight:      Height:        Intake/Output Summary (Last 24 hours) at 06/21/2023 1425 Last data filed at 06/21/2023 1300 Gross per 24 hour  Intake 360 ml  Output 800 ml  Net -440 ml      06/15/2023    7:40 AM 06/09/2023    9:00 AM 06/01/2023   10:09 AM  Last 3 Weights  Weight (lbs) 196 lb 206 lb 207 lb  Weight (kg) 88.905 kg 93.441 kg 93.895 kg      Telemetry/ECG    06/21/2023 telemetry- Personally Reviewed PVCs.  No sustained arrhythmias.    Physical Exam .   General: Well-developed, in no acute distress. Cardiac: Normal rate and regular rhythm. Respiratory: Normal work of breathing.  Lungs clear to auscultation. Extremities.  Status post right BKA.  No edema. Neuro: ANO x 3, no gross focal deficits.   Assessment & Plan .     Mr. Neville Gerecke is a 67 y/o male with hypertension, hyperlipidemia, PAD s/p Rt BKA, LLE PTCA, rt CEA, CAD who presented with unstable angina and was found to have 95% ostial LM stenosis, elevated lipoprotein a.  He is awaiting planned CABG on 06/24/2023.  #. Unstable Angina:  #. CAD: 95% ostial left main stenosis. Awaiting CABG, scheduled for 06/24/2023. Currently chest pain-free. Continue aspirin, heparin. With new finding of elevated lipoprotein a, plans are for Lipitor 40 mg daily, with consideration for PCSK9 inhibitor  therapy outpatient, and possibly participation in lipoprotein a clinical trials.  #PAD:  S/p right BKA. No critical limb ischemia in left lower extremity. Continue aspirin, see above regarding lipid-lowering therapy. He was on irbesartan-hydrochlorothiazide 300-12.5 mg outpatient. Currently holding pending CABG. He should be on some ARB outpatient after discharge. If BP on the lower side, low dose ARB alone would suffice.   #Mixed hyperlipidemia, elevated lipoprotein (a): As above   For questions or updates, please contact [facility] Please consult [email or web address] for contact info under        Signed, Nobie Putnam, MD

## 2023-06-21 NOTE — Progress Notes (Signed)
PHARMACY - ANTICOAGULATION CONSULT NOTE  Pharmacy Consult for heparin Indication: chest pain/ACS  Allergies  Allergen Reactions   Penicillins Rash    Has patient had a PCN reaction causing immediate rash, facial/tongue/throat swelling, SOB or lightheadedness with hypotension: Yes Has patient had a PCN reaction causing severe rash involving mucus membranes or skin necrosis: No Has patient had a PCN reaction that required hospitalization No Has patient had a PCN reaction occurring within the last 10 years: No If all of the above answers are "NO", then may proceed with Cephalosporin use.  Tolerated Cefazolin on 04/16/22    Sulfa Antibiotics Swelling and Other (See Comments)    Facial/lip swelling    Patient Measurements: Height: 5\' 10"  (177.8 cm) Weight: 88.9 kg (196 lb) IBW/kg (Calculated) : 73 HEPARIN DW (KG): 88.9   Vital Signs: Temp: 97.7 F (36.5 C) (11/10 0430) Temp Source: Oral (11/10 0430) BP: 122/61 (11/10 0430) Pulse Rate: 56 (11/10 0430)  Labs: Recent Labs    06/19/23 0330 06/19/23 0947 06/20/23 0334 06/21/23 0412  HGB 12.0*  --  11.6* 11.2*  HCT 36.0*  --  34.9* 33.9*  PLT 219  --  213 206  HEPARINUNFRC 0.46 0.51 0.50 0.37  CREATININE 1.26*  --   --   --     Estimated Creatinine Clearance: 63 mL/min (A) (by C-G formula based on SCr of 1.26 mg/dL (H)).   Medical History: Past Medical History:  Diagnosis Date   Carotid artery occlusion    Cellulitis    Erectile dysfunction    Hyperlipidemia    Hypertension    Peripheral vascular disease (HCC)     Medications:  Medications Prior to Admission  Medication Sig Dispense Refill Last Dose   amLODipine (NORVASC) 2.5 MG tablet Take 2.5 mg by mouth at bedtime.   06/14/2023   aspirin EC 81 MG tablet Take 81 mg by mouth at bedtime.   06/15/2023 at 0545   atorvastatin (LIPITOR) 80 MG tablet Take 80 mg by mouth at bedtime.   06/14/2023   clopidogrel (PLAVIX) 75 MG tablet Take 1 tablet (75 mg total) by mouth  daily. 30 tablet 11 06/15/2023 at 0545   irbesartan-hydrochlorothiazide (AVALIDE) 300-12.5 MG tablet Take 1 tablet by mouth at bedtime.   Past Week   Multiple Vitamin (MULTIVITAMIN) capsule Take 2 capsules by mouth every morning. Gummy   06/15/2023   nitroGLYCERIN (NITROSTAT) 0.4 MG SL tablet Place 1 tablet (0.4 mg total) under the tongue every 5 (five) minutes as needed for chest pain. 25 tablet 3 Past Week   Omega-3 Fatty Acids (OMEGA 3 PO) Take 2 capsules by mouth daily. 454 mg each   06/15/2023   Probiotic Product (PROBIOTIC PO) Take 220 mg by mouth daily.   06/15/2023   vitamin C (ASCORBIC ACID) 250 MG tablet Take 500 mg by mouth daily.   06/15/2023   zinc sulfate 220 (50 Zn) MG capsule Take 1 capsule (220 mg total) by mouth daily.   06/15/2023   terbinafine (LAMISIL) 250 MG tablet Take 250 mg by mouth daily.   More than a month   Scheduled:   amLODipine  2.5 mg Oral Daily   aspirin EC  81 mg Oral Daily   atorvastatin  40 mg Oral Daily   ezetimibe  10 mg Oral Daily   metoprolol tartrate  12.5 mg Oral BID   sodium chloride flush  3 mL Intravenous Q12H    Assessment: 67 yo male s/p cath 11/4 with LM disease. Plans are  for CABG on 06/24/23  11/10 AM: HL 0.37, therapeutic on 1050 units/hr. No signs of bleeding or issues with infusion running per RN. CBC stable (Hgb 11.2, Pltp 206).  Goal of Therapy:  Heparin level 0.3-0.7 units/ml Monitor platelets by anticoagulation protocol: Yes   Plan:  -Continue heparin at 1050 units/hr -Heparin level daily wth CBC daily -Monitor for s/sx of bleeding daily  Enos Fling, PharmD PGY-1 Acute Care Pharmacy Resident 06/21/2023 8:10 AM

## 2023-06-22 ENCOUNTER — Inpatient Hospital Stay (HOSPITAL_COMMUNITY): Payer: 59

## 2023-06-22 DIAGNOSIS — I10 Essential (primary) hypertension: Secondary | ICD-10-CM

## 2023-06-22 DIAGNOSIS — I779 Disorder of arteries and arterioles, unspecified: Secondary | ICD-10-CM

## 2023-06-22 DIAGNOSIS — I25110 Atherosclerotic heart disease of native coronary artery with unstable angina pectoris: Secondary | ICD-10-CM | POA: Diagnosis not present

## 2023-06-22 DIAGNOSIS — I493 Ventricular premature depolarization: Secondary | ICD-10-CM

## 2023-06-22 DIAGNOSIS — E785 Hyperlipidemia, unspecified: Secondary | ICD-10-CM

## 2023-06-22 LAB — BASIC METABOLIC PANEL
Anion gap: 6 (ref 5–15)
BUN: 15 mg/dL (ref 8–23)
CO2: 27 mmol/L (ref 22–32)
Calcium: 9.2 mg/dL (ref 8.9–10.3)
Chloride: 108 mmol/L (ref 98–111)
Creatinine, Ser: 1.26 mg/dL — ABNORMAL HIGH (ref 0.61–1.24)
GFR, Estimated: >60 mL/min (ref >60)
Glucose, Bld: 96 mg/dL (ref 70–99)
Potassium: 5.1 mmol/L (ref 3.5–5.1)
Sodium: 141 mmol/L (ref 135–145)

## 2023-06-22 LAB — BLOOD GAS, ARTERIAL
Acid-Base Excess: 0.8 mmol/L (ref 0.0–2.0)
Bicarbonate: 25.3 mmol/L (ref 20.0–28.0)
Drawn by: 62428
O2 Saturation: 98.1 %
Patient temperature: 36.8
pCO2 arterial: 39 mm[Hg] (ref 32–48)
pH, Arterial: 7.42 (ref 7.35–7.45)
pO2, Arterial: 99 mm[Hg] (ref 83–108)

## 2023-06-22 LAB — URINALYSIS, ROUTINE W REFLEX MICROSCOPIC
Bilirubin Urine: NEGATIVE
Glucose, UA: NEGATIVE mg/dL
Hgb urine dipstick: NEGATIVE
Ketones, ur: NEGATIVE mg/dL
Leukocytes,Ua: NEGATIVE
Nitrite: NEGATIVE
Protein, ur: NEGATIVE mg/dL
Specific Gravity, Urine: 1.012 (ref 1.005–1.030)
pH: 6 (ref 5.0–8.0)

## 2023-06-22 LAB — CBC
HCT: 35.3 % — ABNORMAL LOW (ref 39.0–52.0)
Hemoglobin: 11.9 g/dL — ABNORMAL LOW (ref 13.0–17.0)
MCH: 32.6 pg (ref 26.0–34.0)
MCHC: 33.7 g/dL (ref 30.0–36.0)
MCV: 96.7 fL (ref 80.0–100.0)
Platelets: 214 10*3/uL (ref 150–400)
RBC: 3.65 MIL/uL — ABNORMAL LOW (ref 4.22–5.81)
RDW: 12.6 % (ref 11.5–15.5)
WBC: 5.4 10*3/uL (ref 4.0–10.5)
nRBC: 0 % (ref 0.0–0.2)

## 2023-06-22 LAB — HEPARIN LEVEL (UNFRACTIONATED): Heparin Unfractionated: 0.4 [IU]/mL (ref 0.30–0.70)

## 2023-06-22 LAB — SURGICAL PCR SCREEN
MRSA, PCR: NEGATIVE
Staphylococcus aureus: NEGATIVE

## 2023-06-22 MED ORDER — EMPAGLIFLOZIN 25 MG PO TABS: 25.0000 mg | ORAL_TABLET | Freq: Every day | ORAL | Status: DC

## 2023-06-22 MED ORDER — ATORVASTATIN CALCIUM 80 MG PO TABS
80.0000 mg | ORAL_TABLET | Freq: Every day | ORAL | Status: DC
  Administered 2023-06-23 – 2023-06-29 (×6): 80 mg via ORAL
  Filled 2023-06-22 (×6): qty 1

## 2023-06-22 MED ORDER — EMPAGLIFLOZIN 10 MG PO TABS: 10.0000 mg | ORAL_TABLET | Freq: Every day | ORAL | Status: DC

## 2023-06-22 NOTE — Progress Notes (Addendum)
Patient Name: Clinton [name] Date of Encounter: 06/22/2023 [facility] Cardiologist: Nanetta Batty, MD   Interval Summary  .    No complaints. Waiting for CABG  Vital Signs .    Vitals:   06/21/23 1936 06/22/23 0418 06/22/23 0439 06/22/23 1001  BP: (!) 143/62 (!) 125/52  (!) 116/43  Pulse: (!) 56 (!) 48  60  Resp: 18 16    Temp: 98.5 F (36.9 C) (!) 97.5 F (36.4 C)    TempSrc: Oral Oral    SpO2: 96% 95%    Weight:   86 kg   Height:        Intake/Output Summary (Last 24 hours) at 06/22/2023 1020 Last data filed at 06/22/2023 1004 Gross per 24 hour  Intake 1950.2 ml  Output 600 ml  Net 1350.2 ml      06/22/2023    4:39 AM 06/15/2023    7:40 AM 06/09/2023    9:00 AM  Last 3 Weights  Weight (lbs) 189 lb 9.6 oz 196 lb 206 lb  Weight (kg) 86.002 kg 88.905 kg 93.441 kg      Telemetry/ECG    Sinus Bradycardia - Personally Reviewed  Physical Exam .   GEN: No acute distress.   Neck: No JVD Cardiac: RRR, no murmurs, rubs, or gallops.  Respiratory: Clear to auscultation bilaterally. GI: Soft, nontender, non-distended  MS: No edema  Assessment & Plan .     67 y.o. male with PMH of hypertension, hyperlipidemia, PAD and carotid artery disease s/p R CEA on 09/05/2015 who presented for outpatient cardiac cath. Found to have severe LM disease, admitted with plans for CABG.   Unstable Angina CAD -- underwent outpatient cardiac cath with 95% ost/mLM stenosis, 1st diag lesion 50%. Seen by TCTS with need for plavix washout prior to surgery. Posted for 11/13. -- Echo showed LVEF of 60-65%, normal RV.  -- remains on IV heparin, ASA, statin, metoprolol 12.5mg  BID   HLD -- LDL 70, HDL 50, LP (a) 147 -- continue atorvastatin 80mg  daily -- would plan for lipid clinic referral as an outpatient    HTN -- home ARB/hydrochlorothiazide held -- continue metoprolol 12.5mg  BID   PVCs -- improved -- continue amlodipine 2.5mg  daily, metoprolol 12.5mg  BID     Carotid artery disease S/p R CEA '17 -- carotid dopplers with 1-39% bilaterally -- on ASA, statin   PAD  S/p Right BKA -- Right below knee amputation in 2023 for critical limb ischemia. Recent angiography showed heavily calcified but patent left SFA and heavily calcified and diffusely diseased below knee popliteal artery with 80-90% stenosis at the level of the anterior tibial artery, treated with drug-coated balloon angioplasty -- on ASA, statin  For questions or updates, please contact [facility] Please consult [email or web address] for contact info under        Signed, Laverda Page, NP    ATTENDING ATTESTATION  I have seen, examined and evaluated the patient this morning on rounds along with Junius Creamer, NP.  After reviewing all the available data and chart, we discussed the patients laboratory, study & physical findings as well as symptoms in detail.  I agree with her findings, examination as well as impression recommendations as per our discussion.     Doing well.  No active anginal symptoms.  On stable medications.  Blood pressure somewhat labile so we will hold off on making adjustments for now. Continue on IV heparin along with aspirin statin and beta-blocker for now. Would likely  consider reinitiating ARB plus or minus HCTZ on discharge.  CABG pending for 06/24/2023.    Marykay Lex, MD, MS Bryan Lemma, M.D., M.S. Interventional Cardiologist  [facility]  Pager # [phone] Phone # [phone] [address] [place], [place] [address]

## 2023-06-22 NOTE — Progress Notes (Signed)
PHARMACY - ANTICOAGULATION CONSULT NOTE  Pharmacy Consult for heparin Indication: chest pain/ACS  Allergies  Allergen Reactions   Penicillins Rash    Has patient had a PCN reaction causing immediate rash, facial/tongue/throat swelling, SOB or lightheadedness with hypotension: Yes Has patient had a PCN reaction causing severe rash involving mucus membranes or skin necrosis: No Has patient had a PCN reaction that required hospitalization No Has patient had a PCN reaction occurring within the last 10 years: No If all of the above answers are "NO", then may proceed with Cephalosporin use.  Tolerated Cefazolin on 04/16/22    Sulfa Antibiotics Swelling and Other (See Comments)    Facial/lip swelling    Patient Measurements: Height: 5\' 10"  (177.8 cm) Weight: 86 kg (189 lb 9.6 oz) IBW/kg (Calculated) : 73 HEPARIN DW (KG): 88.9   Vital Signs: Temp: 97.5 F (36.4 C) (11/11 0418) Temp Source: Oral (11/11 0418) BP: 125/52 (11/11 0418) Pulse Rate: 48 (11/11 0418)  Labs: Recent Labs    06/20/23 0334 06/21/23 0412 06/22/23 0543  HGB 11.6* 11.2* 11.9*  HCT 34.9* 33.9* 35.3*  PLT 213 206 214  HEPARINUNFRC 0.50 0.37 0.40    Estimated Creatinine Clearance: 57.9 mL/min (A) (by C-G formula based on SCr of 1.26 mg/dL (H)).   Medical History: Past Medical History:  Diagnosis Date   Carotid artery occlusion    Cellulitis    Erectile dysfunction    Hyperlipidemia    Hypertension    Peripheral vascular disease (HCC)     Medications:  Medications Prior to Admission  Medication Sig Dispense Refill Last Dose   amLODipine (NORVASC) 2.5 MG tablet Take 2.5 mg by mouth at bedtime.   06/14/2023   aspirin EC 81 MG tablet Take 81 mg by mouth at bedtime.   06/15/2023 at 0545   atorvastatin (LIPITOR) 80 MG tablet Take 80 mg by mouth at bedtime.   06/14/2023   clopidogrel (PLAVIX) 75 MG tablet Take 1 tablet (75 mg total) by mouth daily. 30 tablet 11 06/15/2023 at 0545    irbesartan-hydrochlorothiazide (AVALIDE) 300-12.5 MG tablet Take 1 tablet by mouth at bedtime.   Past Week   Multiple Vitamin (MULTIVITAMIN) capsule Take 2 capsules by mouth every morning. Gummy   06/15/2023   nitroGLYCERIN (NITROSTAT) 0.4 MG SL tablet Place 1 tablet (0.4 mg total) under the tongue every 5 (five) minutes as needed for chest pain. 25 tablet 3 Past Week   Omega-3 Fatty Acids (OMEGA 3 PO) Take 2 capsules by mouth daily. 454 mg each   06/15/2023   Probiotic Product (PROBIOTIC PO) Take 220 mg by mouth daily.   06/15/2023   vitamin C (ASCORBIC ACID) 250 MG tablet Take 500 mg by mouth daily.   06/15/2023   zinc sulfate 220 (50 Zn) MG capsule Take 1 capsule (220 mg total) by mouth daily.   06/15/2023   terbinafine (LAMISIL) 250 MG tablet Take 250 mg by mouth daily.   More than a month   Scheduled:   amLODipine  2.5 mg Oral Daily   aspirin EC  81 mg Oral Daily   atorvastatin  40 mg Oral Daily   ezetimibe  10 mg Oral Daily   metoprolol tartrate  12.5 mg Oral BID   sodium chloride flush  3 mL Intravenous Q12H    Assessment: 67 yo male s/p cath 11/4 with LM disease. Plans are for CABG on 06/24/23  HL 0.4, therapeutic on 1050 units/hr. No signs of bleeding or issues with infusion running per RN.  CBC stable.  Goal of Therapy:  Heparin level 0.3-0.7 units/ml Monitor platelets by anticoagulation protocol: Yes   Plan:  -Continue heparin at 1050 units/hr -Heparin level daily wth CBC daily -Monitor for s/sx of bleeding daily   Verdene Rio, PharmD PGY1 Pharmacy Resident

## 2023-06-22 NOTE — Progress Notes (Signed)
Mobility Specialist Progress Note:   06/22/23 1100  Mobility  Activity Ambulated independently in hallway  Level of Assistance Modified independent, requires aide device or extra time  Assistive Device None  Distance Ambulated (ft) 400 ft  Activity Response Tolerated well  Mobility Referral Yes  $Mobility charge 1 Mobility  Mobility Specialist Start Time (ACUTE ONLY) 1123  Mobility Specialist Stop Time (ACUTE ONLY) 1130  Mobility Specialist Time Calculation (min) (ACUTE ONLY) 7 min    Received pt in chair having no complaints and agreeable to mobility. Pt was asymptomatic throughout ambulation and returned to room w/o fault. Left in chair w/ call bell in reach and all needs met.   D'Vante Earlene Plater Mobility Specialist Please contact via Special educational needs teacher or Rehab office at [phone]

## 2023-06-22 NOTE — Plan of Care (Signed)
  Problem: Activity: Goal: Ability to return to baseline activity level will improve Outcome: Progressing   Problem: Cardiovascular: Goal: Ability to achieve and maintain adequate cardiovascular perfusion will improve Outcome: Progressing   Problem: Health Behavior/Discharge Planning: Goal: Ability to safely manage health-related needs after discharge will improve Outcome: Progressing   Problem: Education: Goal: Knowledge of General Education information will improve Description: Including pain rating scale, medication(s)/side effects and non-pharmacologic comfort measures Outcome: Progressing   Problem: Health Behavior/Discharge Planning: Goal: Ability to manage health-related needs will improve Outcome: Progressing   Problem: Clinical Measurements: Goal: Ability to maintain clinical measurements within normal limits will improve Outcome: Progressing Goal: Will remain free from infection Outcome: Progressing Goal: Diagnostic test results will improve Outcome: Progressing Goal: Respiratory complications will improve Outcome: Progressing Goal: Cardiovascular complication will be avoided Outcome: Progressing   Problem: Activity: Goal: Risk for activity intolerance will decrease Outcome: Progressing   Problem: Nutrition: Goal: Adequate nutrition will be maintained Outcome: Progressing   Problem: Coping: Goal: Level of anxiety will decrease Outcome: Progressing   Problem: Elimination: Goal: Will not experience complications related to bowel motility Outcome: Progressing Goal: Will not experience complications related to urinary retention Outcome: Progressing   Problem: Pain Management: Goal: General experience of comfort will improve Outcome: Progressing   Problem: Safety: Goal: Ability to remain free from injury will improve Outcome: Progressing   Problem: Skin Integrity: Goal: Risk for impaired skin integrity will decrease Outcome: Progressing

## 2023-06-23 ENCOUNTER — Other Ambulatory Visit: Payer: Self-pay

## 2023-06-23 DIAGNOSIS — E785 Hyperlipidemia, unspecified: Secondary | ICD-10-CM | POA: Diagnosis not present

## 2023-06-23 DIAGNOSIS — I739 Peripheral vascular disease, unspecified: Secondary | ICD-10-CM

## 2023-06-23 DIAGNOSIS — I10 Essential (primary) hypertension: Secondary | ICD-10-CM | POA: Diagnosis not present

## 2023-06-23 DIAGNOSIS — I493 Ventricular premature depolarization: Secondary | ICD-10-CM | POA: Diagnosis not present

## 2023-06-23 DIAGNOSIS — I25110 Atherosclerotic heart disease of native coronary artery with unstable angina pectoris: Secondary | ICD-10-CM | POA: Diagnosis not present

## 2023-06-23 LAB — CBC
HCT: 35.3 % — ABNORMAL LOW (ref 39.0–52.0)
Hemoglobin: 12.1 g/dL — ABNORMAL LOW (ref 13.0–17.0)
MCH: 33 pg (ref 26.0–34.0)
MCHC: 34.3 g/dL (ref 30.0–36.0)
MCV: 96.2 fL (ref 80.0–100.0)
Platelets: 205 10*3/uL (ref 150–400)
RBC: 3.67 MIL/uL — ABNORMAL LOW (ref 4.22–5.81)
RDW: 12.7 % (ref 11.5–15.5)
WBC: 5.3 10*3/uL (ref 4.0–10.5)
nRBC: 0 % (ref 0.0–0.2)

## 2023-06-23 LAB — SARS CORONAVIRUS 2 BY RT PCR: SARS Coronavirus 2 by RT PCR: NEGATIVE

## 2023-06-23 LAB — HEMOGLOBIN A1C
Hgb A1c MFr Bld: 5.3 % (ref 4.8–5.6)
Mean Plasma Glucose: 105.41 mg/dL

## 2023-06-23 LAB — BASIC METABOLIC PANEL
Anion gap: 7 (ref 5–15)
BUN: 15 mg/dL (ref 8–23)
CO2: 26 mmol/L (ref 22–32)
Calcium: 9 mg/dL (ref 8.9–10.3)
Chloride: 106 mmol/L (ref 98–111)
Creatinine, Ser: 1.18 mg/dL (ref 0.61–1.24)
GFR, Estimated: >60 mL/min (ref >60)
Glucose, Bld: 95 mg/dL (ref 70–99)
Potassium: 4.5 mmol/L (ref 3.5–5.1)
Sodium: 139 mmol/L (ref 135–145)

## 2023-06-23 LAB — APTT: aPTT: 65 s — ABNORMAL HIGH (ref 24–36)

## 2023-06-23 LAB — PROTIME-INR
INR: 1 (ref 0.8–1.2)
Prothrombin Time: 13.6 s (ref 11.4–15.2)

## 2023-06-23 LAB — HEPARIN LEVEL (UNFRACTIONATED): Heparin Unfractionated: 0.45 [IU]/mL (ref 0.30–0.70)

## 2023-06-23 MED ORDER — NOREPINEPHRINE 4 MG/250ML-% IV SOLN
0.0000 ug/min | INTRAVENOUS | Status: DC
  Filled 2023-06-23: qty 250

## 2023-06-23 MED ORDER — EPINEPHRINE HCL 5 MG/250ML IV SOLN IN NS
0.0000 ug/min | INTRAVENOUS | Status: DC
  Filled 2023-06-23: qty 250

## 2023-06-23 MED ORDER — PHENYLEPHRINE HCL-NACL 20-0.9 MG/250ML-% IV SOLN
30.0000 ug/min | INTRAVENOUS | Status: AC
  Administered 2023-06-24: 30 ug/min via INTRAVENOUS
  Filled 2023-06-23: qty 250

## 2023-06-23 MED ORDER — CEFAZOLIN SODIUM-DEXTROSE 2-4 GM/100ML-% IV SOLN
2.0000 g | INTRAVENOUS | Status: AC
  Administered 2023-06-24 (×2): 2 g via INTRAVENOUS
  Filled 2023-06-23: qty 100

## 2023-06-23 MED ORDER — HEPARIN 30,000 UNITS/1000 ML (OHS) CELLSAVER SOLUTION
Status: DC
  Filled 2023-06-23: qty 1000

## 2023-06-23 MED ORDER — POTASSIUM CHLORIDE 2 MEQ/ML IV SOLN
80.0000 meq | INTRAVENOUS | Status: DC
  Filled 2023-06-23: qty 40

## 2023-06-23 MED ORDER — BISACODYL 5 MG PO TBEC
5.0000 mg | DELAYED_RELEASE_TABLET | Freq: Once | ORAL | Status: AC
  Administered 2023-06-23: 5 mg via ORAL
  Filled 2023-06-23: qty 1

## 2023-06-23 MED ORDER — CEFAZOLIN SODIUM-DEXTROSE 2-4 GM/100ML-% IV SOLN
2.0000 g | INTRAVENOUS | Status: DC
  Filled 2023-06-23: qty 100

## 2023-06-23 MED ORDER — INSULIN REGULAR(HUMAN) IN NACL 100-0.9 UT/100ML-% IV SOLN
INTRAVENOUS | Status: DC
  Filled 2023-06-23: qty 100

## 2023-06-23 MED ORDER — MILRINONE LACTATE IN DEXTROSE 20-5 MG/100ML-% IV SOLN
0.3000 ug/kg/min | INTRAVENOUS | Status: DC
  Filled 2023-06-23: qty 100

## 2023-06-23 MED ORDER — TEMAZEPAM 15 MG PO CAPS: 15.0000 mg | ORAL_CAPSULE | Freq: Once | ORAL | Status: DC | PRN

## 2023-06-23 MED ORDER — PLASMA-LYTE A IV SOLN
INTRAVENOUS | Status: DC
  Filled 2023-06-23: qty 5

## 2023-06-23 MED ORDER — TRANEXAMIC ACID (OHS) PUMP PRIME SOLUTION
2.0000 mg/kg | INTRAVENOUS | Status: DC
  Filled 2023-06-23: qty 1.72

## 2023-06-23 MED ORDER — CHLORHEXIDINE GLUCONATE CLOTH 2 % EX PADS: 6.0000 | MEDICATED_PAD | Freq: Once | CUTANEOUS | Status: DC

## 2023-06-23 MED ORDER — DEXMEDETOMIDINE HCL IN NACL 400 MCG/100ML IV SOLN
0.1000 ug/kg/h | INTRAVENOUS | Status: DC
  Filled 2023-06-23: qty 100

## 2023-06-23 MED ORDER — NITROGLYCERIN IN D5W 200-5 MCG/ML-% IV SOLN
2.0000 ug/min | INTRAVENOUS | Status: DC
  Filled 2023-06-23: qty 250

## 2023-06-23 MED ORDER — MANNITOL 20 % IV SOLN
INTRAVENOUS | Status: DC
  Filled 2023-06-23: qty 13

## 2023-06-23 MED ORDER — METOPROLOL TARTRATE 12.5 MG HALF TABLET
12.5000 mg | ORAL_TABLET | Freq: Once | ORAL | Status: AC
  Administered 2023-06-24: 12.5 mg via ORAL
  Filled 2023-06-23: qty 1

## 2023-06-23 MED ORDER — TRANEXAMIC ACID 1000 MG/10ML IV SOLN
1.5000 mg/kg/h | INTRAVENOUS | Status: AC
  Administered 2023-06-24: 1.5 mg/kg/h via INTRAVENOUS
  Filled 2023-06-23: qty 25

## 2023-06-23 MED ORDER — VANCOMYCIN HCL 1500 MG/300ML IV SOLN
1500.0000 mg | INTRAVENOUS | Status: AC
  Administered 2023-06-24: 1500 mg via INTRAVENOUS
  Filled 2023-06-23: qty 300

## 2023-06-23 MED ORDER — METHADONE HCL IV SYRINGE 10 MG/ML FOR CABG
0.3000 mg/kg | Freq: Once | INTRAMUSCULAR | Status: AC
  Administered 2023-06-24: 22 mg via INTRAVENOUS
  Filled 2023-06-23: qty 2.2

## 2023-06-23 MED ORDER — CHLORHEXIDINE GLUCONATE 0.12 % MT SOLN
15.0000 mL | Freq: Once | OROMUCOSAL | Status: AC
Start: 2023-06-24 — End: 2023-06-24
  Administered 2023-06-24: 15 mL via OROMUCOSAL
  Filled 2023-06-23: qty 15

## 2023-06-23 MED ORDER — TRANEXAMIC ACID (OHS) BOLUS VIA INFUSION
15.0000 mg/kg | INTRAVENOUS | Status: AC
  Administered 2023-06-24: 1290 mg via INTRAVENOUS
  Filled 2023-06-23: qty 1290

## 2023-06-23 NOTE — Anesthesia Preprocedure Evaluation (Addendum)
Anesthesia Evaluation  Patient identified by MRN, date of birth, ID band Patient awake    Reviewed: Allergy & Precautions, NPO status , Patient's Chart, lab work & pertinent test results  Airway Mallampati: II  TM Distance: >3 FB Neck ROM: Full    Dental no notable dental hx.    Pulmonary neg pulmonary ROS   Pulmonary exam normal        Cardiovascular hypertension, Pt. on medications + CAD and + Peripheral Vascular Disease   Rhythm:Regular Rate:Normal    1. Left ventricular ejection fraction, by estimation, is 55 to 60%. The left ventricle has normal function. The left ventricle has no regional wall motion abnormalities. Left ventricular diastolic parameters are consistent with Grade I diastolic dysfunction (impaired relaxation).  2. Right ventricular systolic function is normal. The right ventricular size is normal. There is normal pulmonary artery systolic pressure. The estimated right ventricular systolic pressure is 34.6 mmHg.  3. The mitral valve is grossly normal. Trivial mitral valve regurgitation. No evidence of mitral stenosis.  4. The aortic valve is tricuspid. Aortic valve regurgitation is not visualized. No aortic stenosis is present.  5. The inferior vena cava is normal in size with greater than 50% respiratory variability, suggesting right atrial pressure of 3 mmHg.   Comparison(s): No significant change from prior study.  Narrative   Ost LM to Mid LM lesion is 95% stenosed.   1st Diag lesion is 50% stenosed.   1.  95% left main lesion.   2.  LVEDP of 36 mmHg.    Neuro/Psych negative neurological ROS  negative psych ROS   GI/Hepatic negative GI ROS, Neg liver ROS,,,  Endo/Other  negative endocrine ROS    Renal/GU   negative genitourinary   Musculoskeletal negative musculoskeletal ROS (+)    Abdominal Normal abdominal exam  (+)   Peds  Hematology  (+) Blood dyscrasia, anemia Lab Results       Component                Value               Date                      WBC                      5.3                 06/23/2023                HGB                      12.1 (L)            06/23/2023                HCT                      35.3 (L)            06/23/2023                MCV                      96.2                06/23/2023                PLT  205                 06/23/2023              Anesthesia Other Findings   Reproductive/Obstetrics                             Anesthesia Physical Anesthesia Plan  ASA: 4  Anesthesia Plan: General   Post-op Pain Management:    Induction: Intravenous  PONV Risk Score and Plan: 2 and Ondansetron, Midazolam and Treatment may vary due to age or medical condition  Airway Management Planned: Mask and Oral ETT  Additional Equipment: Arterial line, CVP, PA Cath, TEE, 3D TEE and Ultrasound Guidance Line Placement  Intra-op Plan:   Post-operative Plan: Post-operative intubation/ventilation  Informed Consent: I have reviewed the patients History and Physical, chart, labs and discussed the procedure including the risks, benefits and alternatives for the proposed anesthesia with the patient or authorized representative who has indicated his/her understanding and acceptance.     Dental advisory given  Plan Discussed with: CRNA  Anesthesia Plan Comments: (- Methadone intra-op 0.2/mg/kg)       Anesthesia Quick Evaluation

## 2023-06-23 NOTE — Progress Notes (Signed)
PHARMACY - ANTICOAGULATION CONSULT NOTE  Pharmacy Consult for heparin Indication: chest pain/ACS  Allergies  Allergen Reactions   Penicillins Rash    Has patient had a PCN reaction causing immediate rash, facial/tongue/throat swelling, SOB or lightheadedness with hypotension: Yes Has patient had a PCN reaction causing severe rash involving mucus membranes or skin necrosis: No Has patient had a PCN reaction that required hospitalization No Has patient had a PCN reaction occurring within the last 10 years: No If all of the above answers are "NO", then may proceed with Cephalosporin use.  Tolerated Cefazolin on 04/16/22    Sulfa Antibiotics Swelling and Other (See Comments)    Facial/lip swelling    Patient Measurements: Height: 5\' 10"  (177.8 cm) Weight: 86 kg (189 lb 9.6 oz) IBW/kg (Calculated) : 73 HEPARIN DW (KG): 88.9   Vital Signs: Temp: 98.7 F (37.1 C) (11/12 0752) Temp Source: Oral (11/12 0752) BP: 127/57 (11/12 0752) Pulse Rate: 62 (11/12 0752)  Labs: Recent Labs    06/21/23 0412 06/22/23 0543 06/23/23 0602  HGB 11.2* 11.9* 12.1*  HCT 33.9* 35.3* 35.3*  PLT 206 214 205  APTT  --   --  65*  LABPROT  --   --  13.6  INR  --   --  1.0  HEPARINUNFRC 0.37 0.40 0.45  CREATININE  --  1.26* 1.18    Estimated Creatinine Clearance: 61.9 mL/min (by C-G formula based on SCr of 1.18 mg/dL).   Medical History: Past Medical History:  Diagnosis Date   Carotid artery occlusion    Cellulitis    Erectile dysfunction    Hyperlipidemia    Hypertension    Peripheral vascular disease (HCC)     Medications:  Medications Prior to Admission  Medication Sig Dispense Refill Last Dose   amLODipine (NORVASC) 2.5 MG tablet Take 2.5 mg by mouth at bedtime.   06/14/2023   aspirin EC 81 MG tablet Take 81 mg by mouth at bedtime.   06/15/2023 at 0545   atorvastatin (LIPITOR) 80 MG tablet Take 80 mg by mouth at bedtime.   06/14/2023   clopidogrel (PLAVIX) 75 MG tablet Take 1 tablet  (75 mg total) by mouth daily. 30 tablet 11 06/15/2023 at 0545   irbesartan-hydrochlorothiazide (AVALIDE) 300-12.5 MG tablet Take 1 tablet by mouth at bedtime.   Past Week   Multiple Vitamin (MULTIVITAMIN) capsule Take 2 capsules by mouth every morning. Gummy   06/15/2023   nitroGLYCERIN (NITROSTAT) 0.4 MG SL tablet Place 1 tablet (0.4 mg total) under the tongue every 5 (five) minutes as needed for chest pain. 25 tablet 3 Past Week   Omega-3 Fatty Acids (OMEGA 3 PO) Take 2 capsules by mouth daily. 454 mg each   06/15/2023   Probiotic Product (PROBIOTIC PO) Take 220 mg by mouth daily.   06/15/2023   vitamin C (ASCORBIC ACID) 250 MG tablet Take 500 mg by mouth daily.   06/15/2023   zinc sulfate 220 (50 Zn) MG capsule Take 1 capsule (220 mg total) by mouth daily.   06/15/2023   terbinafine (LAMISIL) 250 MG tablet Take 250 mg by mouth daily.   More than a month   Scheduled:   amLODipine  2.5 mg Oral Daily   aspirin EC  81 mg Oral Daily   atorvastatin  80 mg Oral Daily   ezetimibe  10 mg Oral Daily   metoprolol tartrate  12.5 mg Oral BID   sodium chloride flush  3 mL Intravenous Q12H    Assessment: 67  yo male s/p cath 11/4 with LM disease. Plans are for CABG on 06/24/23  HL 0.45, therapeutic on 1050 units/hr. No signs of bleeding or issues with infusion noted. CBC stable.  Goal of Therapy:  Heparin level 0.3-0.7 units/ml Monitor platelets by anticoagulation protocol: Yes   Plan:  -Continue heparin at 1050 units/hr -Heparin level wth CBC daily -Monitor for s/sx of bleeding daily -CABG planned for 06/24/23  Verdene Rio, PharmD PGY1 Pharmacy Resident

## 2023-06-23 NOTE — Progress Notes (Signed)
   Patient Name: Clinton [name] Date of Encounter: 06/23/2023 [facility] Cardiologist: Nanetta Batty, MD   Interval Summary  .    No chest pain. Planned for CABG tomorrow.   Vital Signs .    Vitals:   06/22/23 1155 06/22/23 1900 06/22/23 1938 06/23/23 0305  BP: (!) 118/51  (!) 123/52 (!) 125/56  Pulse: (!) 49 60 (!) 56 (!) 50  Resp: 16   16  Temp: 98.3 F (36.8 C) 98.6 F (37 C) 98.6 F (37 C) 98.3 F (36.8 C)  TempSrc: Oral Oral Oral Oral  SpO2: 98%  98% 99%  Weight:      Height:        Intake/Output Summary (Last 24 hours) at 06/23/2023 0757 Last data filed at 06/23/2023 4742 Gross per 24 hour  Intake 1041.88 ml  Output 1025 ml  Net 16.88 ml      06/22/2023    4:39 AM 06/15/2023    7:40 AM 06/09/2023    9:00 AM  Last 3 Weights  Weight (lbs) 189 lb 9.6 oz 196 lb 206 lb  Weight (kg) 86.002 kg 88.905 kg 93.441 kg      Telemetry/ECG    Sinus Rhythm, occ PVCs - Personally Reviewed  Physical Exam .   GEN: No acute distress.   Neck: No JVD Cardiac: RRR, no murmurs, rubs, or gallops.  Respiratory: Clear to auscultation bilaterally. GI: Soft, nontender, non-distended  MS: No edema  Assessment & Plan .     67 y.o. male with PMH of hypertension, hyperlipidemia, PAD and carotid artery disease s/p R CEA on 09/05/2015 who presented for outpatient cardiac cath. Found to have severe LM disease, admitted with plans for CABG.   Unstable Angina CAD -- underwent outpatient cardiac cath with 95% ost/mLM stenosis, 1st diag lesion 50%. Seen by TCTS with need for plavix washout prior to surgery. Posted for 11/13. No chest pain.  -- Echo showed LVEF of 60-65%, normal RV.  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 Recent angiography showed heavily calcified but patent left SFA and heavily calcified and diffusely diseased below knee popliteal artery with 80-90% stenosis at the level of the anterior tibial artery, treated with drug-coated balloon angioplasty -- on ASA, statin  For questions or updates, please contact [facility] Please consult [email or web address] for contact info under        Signed, Laverda Page, NP

## 2023-06-24 ENCOUNTER — Other Ambulatory Visit: Payer: Self-pay

## 2023-06-24 ENCOUNTER — Inpatient Hospital Stay (HOSPITAL_COMMUNITY): Payer: 59

## 2023-06-24 ENCOUNTER — Inpatient Hospital Stay (HOSPITAL_COMMUNITY): Payer: 59 | Admitting: Certified Registered Nurse Anesthetist

## 2023-06-24 ENCOUNTER — Inpatient Hospital Stay (HOSPITAL_COMMUNITY)
Admission: AD | Disposition: A | Discharge status: Home Health | Payer: Self-pay | Source: Home / Self Care | Attending: Internal Medicine

## 2023-06-24 ENCOUNTER — Encounter (HOSPITAL_COMMUNITY): Payer: Self-pay | Admitting: Internal Medicine

## 2023-06-24 DIAGNOSIS — I2510 Atherosclerotic heart disease of native coronary artery without angina pectoris: Secondary | ICD-10-CM

## 2023-06-24 DIAGNOSIS — I25119 Atherosclerotic heart disease of native coronary artery with unspecified angina pectoris: Secondary | ICD-10-CM | POA: Diagnosis not present

## 2023-06-24 DIAGNOSIS — J9589 Other postprocedural complications and disorders of respiratory system, not elsewhere classified: Secondary | ICD-10-CM | POA: Diagnosis not present

## 2023-06-24 DIAGNOSIS — Z951 Presence of aortocoronary bypass graft: Secondary | ICD-10-CM

## 2023-06-24 HISTORY — PX: TEE WITHOUT CARDIOVERSION: SHX5443

## 2023-06-24 HISTORY — PX: CORONARY ARTERY BYPASS GRAFT: SHX141

## 2023-06-24 LAB — GLUCOSE, CAPILLARY
Glucose-Capillary: 101 mg/dL — ABNORMAL HIGH (ref 70–99)
Glucose-Capillary: 91 mg/dL (ref 70–99)
Glucose-Capillary: 87 mg/dL (ref 70–99)
Glucose-Capillary: 108 mg/dL — ABNORMAL HIGH (ref 70–99)
Glucose-Capillary: 108 mg/dL — ABNORMAL HIGH (ref 70–99)
Glucose-Capillary: 110 mg/dL — ABNORMAL HIGH (ref 70–99)
Glucose-Capillary: 109 mg/dL — ABNORMAL HIGH (ref 70–99)
Glucose-Capillary: 132 mg/dL — ABNORMAL HIGH (ref 70–99)
Glucose-Capillary: 180 mg/dL — ABNORMAL HIGH (ref 70–99)
Glucose-Capillary: 155 mg/dL — ABNORMAL HIGH (ref 70–99)
Glucose-Capillary: 159 mg/dL — ABNORMAL HIGH (ref 70–99)
Glucose-Capillary: 140 mg/dL — ABNORMAL HIGH (ref 70–99)

## 2023-06-24 LAB — POCT I-STAT 7, (LYTES, BLD GAS, ICA,H+H)
Acid-Base Excess: 0 mmol/L (ref 0.0–2.0)
Acid-Base Excess: 1 mmol/L (ref 0.0–2.0)
Acid-Base Excess: 1 mmol/L (ref 0.0–2.0)
Acid-base deficit: 1 mmol/L (ref 0.0–2.0)
Acid-base deficit: 4 mmol/L — ABNORMAL HIGH (ref 0.0–2.0)
Acid-base deficit: 8 mmol/L — ABNORMAL HIGH (ref 0.0–2.0)
Acid-base deficit: 6 mmol/L — ABNORMAL HIGH (ref 0.0–2.0)
Bicarbonate: 24.4 mmol/L (ref 20.0–28.0)
Bicarbonate: 23.6 mmol/L (ref 20.0–28.0)
Bicarbonate: 23.9 mmol/L (ref 20.0–28.0)
Bicarbonate: 24.2 mmol/L (ref 20.0–28.0)
Bicarbonate: 19 mmol/L — ABNORMAL LOW (ref 20.0–28.0)
Bicarbonate: 18.6 mmol/L — ABNORMAL LOW (ref 20.0–28.0)
Bicarbonate: 19.5 mmol/L — ABNORMAL LOW (ref 20.0–28.0)
Calcium, Ion: 1.25 mmol/L (ref 1.15–1.40)
Calcium, Ion: 0.94 mmol/L — ABNORMAL LOW (ref 1.15–1.40)
Calcium, Ion: 1 mmol/L — ABNORMAL LOW (ref 1.15–1.40)
Calcium, Ion: 1.28 mmol/L (ref 1.15–1.40)
Calcium, Ion: 1.18 mmol/L (ref 1.15–1.40)
Calcium, Ion: 1.2 mmol/L (ref 1.15–1.40)
Calcium, Ion: 1.05 mmol/L — ABNORMAL LOW (ref 1.15–1.40)
HCT: 35 % — ABNORMAL LOW (ref 39.0–52.0)
HCT: 22 % — ABNORMAL LOW (ref 39.0–52.0)
HCT: 22 % — ABNORMAL LOW (ref 39.0–52.0)
HCT: 25 % — ABNORMAL LOW (ref 39.0–52.0)
HCT: 27 % — ABNORMAL LOW (ref 39.0–52.0)
HCT: 27 % — ABNORMAL LOW (ref 39.0–52.0)
HCT: 23 % — ABNORMAL LOW (ref 39.0–52.0)
Hemoglobin: 11.9 g/dL — ABNORMAL LOW (ref 13.0–17.0)
Hemoglobin: 7.5 g/dL — ABNORMAL LOW (ref 13.0–17.0)
Hemoglobin: 7.5 g/dL — ABNORMAL LOW (ref 13.0–17.0)
Hemoglobin: 8.5 g/dL — ABNORMAL LOW (ref 13.0–17.0)
Hemoglobin: 9.2 g/dL — ABNORMAL LOW (ref 13.0–17.0)
Hemoglobin: 9.2 g/dL — ABNORMAL LOW (ref 13.0–17.0)
Hemoglobin: 7.8 g/dL — ABNORMAL LOW (ref 13.0–17.0)
O2 Saturation: 100 %
O2 Saturation: 100 %
O2 Saturation: 100 %
O2 Saturation: 100 %
O2 Saturation: 98 %
O2 Saturation: 98 %
O2 Saturation: 98 %
Patient temperature: 35.6
Patient temperature: 37
Patient temperature: 38.5
Potassium: 4.2 mmol/L (ref 3.5–5.1)
Potassium: 4.5 mmol/L (ref 3.5–5.1)
Potassium: 5.4 mmol/L — ABNORMAL HIGH (ref 3.5–5.1)
Potassium: 5.2 mmol/L — ABNORMAL HIGH (ref 3.5–5.1)
Potassium: 4.2 mmol/L (ref 3.5–5.1)
Potassium: 3.8 mmol/L (ref 3.5–5.1)
Potassium: 3.4 mmol/L — ABNORMAL LOW (ref 3.5–5.1)
Sodium: 139 mmol/L (ref 135–145)
Sodium: 136 mmol/L (ref 135–145)
Sodium: 138 mmol/L (ref 135–145)
Sodium: 137 mmol/L (ref 135–145)
Sodium: 141 mmol/L (ref 135–145)
Sodium: 142 mmol/L (ref 135–145)
Sodium: 146 mmol/L — ABNORMAL HIGH (ref 135–145)
TCO2: 26 mmol/L (ref 22–32)
TCO2: 24 mmol/L (ref 22–32)
TCO2: 25 mmol/L (ref 22–32)
TCO2: 25 mmol/L (ref 22–32)
TCO2: 20 mmol/L — ABNORMAL LOW (ref 22–32)
TCO2: 20 mmol/L — ABNORMAL LOW (ref 22–32)
TCO2: 21 mmol/L — ABNORMAL LOW (ref 22–32)
pCO2 arterial: 40.9 mm[Hg] (ref 32–48)
pCO2 arterial: 28.4 mm[Hg] — ABNORMAL LOW (ref 32–48)
pCO2 arterial: 34.5 mm[Hg] (ref 32–48)
pCO2 arterial: 32.9 mm[Hg] (ref 32–48)
pCO2 arterial: 25.6 mm[Hg] — ABNORMAL LOW (ref 32–48)
pCO2 arterial: 44.2 mm[Hg] (ref 32–48)
pCO2 arterial: 40 mm[Hg] (ref 32–48)
pH, Arterial: 7.383 (ref 7.35–7.45)
pH, Arterial: 7.447 (ref 7.35–7.45)
pH, Arterial: 7.528 — ABNORMAL HIGH (ref 7.35–7.45)
pH, Arterial: 7.474 — ABNORMAL HIGH (ref 7.35–7.45)
pH, Arterial: 7.473 — ABNORMAL HIGH (ref 7.35–7.45)
pH, Arterial: 7.232 — ABNORMAL LOW (ref 7.35–7.45)
pH, Arterial: 7.302 — ABNORMAL LOW (ref 7.35–7.45)
pO2, Arterial: 554 mm[Hg] — ABNORMAL HIGH (ref 83–108)
pO2, Arterial: 311 mm[Hg] — ABNORMAL HIGH (ref 83–108)
pO2, Arterial: 426 mm[Hg] — ABNORMAL HIGH (ref 83–108)
pO2, Arterial: 464 mm[Hg] — ABNORMAL HIGH (ref 83–108)
pO2, Arterial: 92 mm[Hg] (ref 83–108)
pO2, Arterial: 134 mm[Hg] — ABNORMAL HIGH (ref 83–108)
pO2, Arterial: 131 mm[Hg] — ABNORMAL HIGH (ref 83–108)

## 2023-06-24 LAB — CBC
HCT: 36.3 % — ABNORMAL LOW (ref 39.0–52.0)
HCT: 29.5 % — ABNORMAL LOW (ref 39.0–52.0)
HCT: 28.6 % — ABNORMAL LOW (ref 39.0–52.0)
HCT: 30.3 % — ABNORMAL LOW (ref 39.0–52.0)
Hemoglobin: 12.2 g/dL — ABNORMAL LOW (ref 13.0–17.0)
Hemoglobin: 10.3 g/dL — ABNORMAL LOW (ref 13.0–17.0)
Hemoglobin: 9.8 g/dL — ABNORMAL LOW (ref 13.0–17.0)
Hemoglobin: 10.2 g/dL — ABNORMAL LOW (ref 13.0–17.0)
MCH: 32.3 pg (ref 26.0–34.0)
MCH: 33.2 pg (ref 26.0–34.0)
MCH: 32.7 pg (ref 26.0–34.0)
MCH: 32.7 pg (ref 26.0–34.0)
MCHC: 33.6 g/dL (ref 30.0–36.0)
MCHC: 34.9 g/dL (ref 30.0–36.0)
MCHC: 34.3 g/dL (ref 30.0–36.0)
MCHC: 33.7 g/dL (ref 30.0–36.0)
MCV: 96 fL (ref 80.0–100.0)
MCV: 95.2 fL (ref 80.0–100.0)
MCV: 95.3 fL (ref 80.0–100.0)
MCV: 97.1 fL (ref 80.0–100.0)
Platelets: 214 10*3/uL (ref 150–400)
Platelets: 138 10*3/uL — ABNORMAL LOW (ref 150–400)
Platelets: 200 10*3/uL (ref 150–400)
Platelets: 216 10*3/uL (ref 150–400)
RBC: 3.78 MIL/uL — ABNORMAL LOW (ref 4.22–5.81)
RBC: 3.1 MIL/uL — ABNORMAL LOW (ref 4.22–5.81)
RBC: 3 MIL/uL — ABNORMAL LOW (ref 4.22–5.81)
RBC: 3.12 MIL/uL — ABNORMAL LOW (ref 4.22–5.81)
RDW: 12.9 % (ref 11.5–15.5)
RDW: 12.7 % (ref 11.5–15.5)
RDW: 12.8 % (ref 11.5–15.5)
RDW: 12.9 % (ref 11.5–15.5)
WBC: 5.8 10*3/uL (ref 4.0–10.5)
WBC: 6.5 10*3/uL (ref 4.0–10.5)
WBC: 11.1 10*3/uL — ABNORMAL HIGH (ref 4.0–10.5)
WBC: 10.8 10*3/uL — ABNORMAL HIGH (ref 4.0–10.5)
nRBC: 0 % (ref 0.0–0.2)
nRBC: 0 % (ref 0.0–0.2)
nRBC: 0 % (ref 0.0–0.2)
nRBC: 0 % (ref 0.0–0.2)

## 2023-06-24 LAB — BASIC METABOLIC PANEL
Anion gap: 7 (ref 5–15)
Anion gap: 9 (ref 5–15)
Anion gap: 8 (ref 5–15)
BUN: 11 mg/dL (ref 8–23)
BUN: 10 mg/dL (ref 8–23)
BUN: 11 mg/dL (ref 8–23)
CO2: 26 mmol/L (ref 22–32)
CO2: 31 mmol/L (ref 22–32)
CO2: 21 mmol/L — ABNORMAL LOW (ref 22–32)
Calcium: 9.1 mg/dL (ref 8.9–10.3)
Calcium: 8.3 mg/dL — ABNORMAL LOW (ref 8.9–10.3)
Calcium: 7.7 mg/dL — ABNORMAL LOW (ref 8.9–10.3)
Chloride: 106 mmol/L (ref 98–111)
Chloride: 107 mmol/L (ref 98–111)
Chloride: 112 mmol/L — ABNORMAL HIGH (ref 98–111)
Creatinine, Ser: 1.48 mg/dL — ABNORMAL HIGH (ref 0.61–1.24)
Creatinine, Ser: 1.16 mg/dL (ref 0.61–1.24)
Creatinine, Ser: 1.19 mg/dL (ref 0.61–1.24)
GFR, Estimated: 51 mL/min — ABNORMAL LOW (ref >60)
GFR, Estimated: >60 mL/min (ref >60)
GFR, Estimated: >60 mL/min (ref >60)
Glucose, Bld: 104 mg/dL — ABNORMAL HIGH (ref 70–99)
Glucose, Bld: 128 mg/dL — ABNORMAL HIGH (ref 70–99)
Glucose, Bld: 149 mg/dL — ABNORMAL HIGH (ref 70–99)
Potassium: 4.6 mmol/L (ref 3.5–5.1)
Potassium: 4.2 mmol/L (ref 3.5–5.1)
Potassium: 3.9 mmol/L (ref 3.5–5.1)
Sodium: 139 mmol/L (ref 135–145)
Sodium: 147 mmol/L — ABNORMAL HIGH (ref 135–145)
Sodium: 141 mmol/L (ref 135–145)

## 2023-06-24 LAB — POCT I-STAT, CHEM 8
BUN: 12 mg/dL (ref 8–23)
BUN: 11 mg/dL (ref 8–23)
BUN: 11 mg/dL (ref 8–23)
BUN: 11 mg/dL (ref 8–23)
Calcium, Ion: 1.28 mmol/L (ref 1.15–1.40)
Calcium, Ion: 1.21 mmol/L (ref 1.15–1.40)
Calcium, Ion: 1.04 mmol/L — ABNORMAL LOW (ref 1.15–1.40)
Calcium, Ion: 1.3 mmol/L (ref 1.15–1.40)
Chloride: 104 mmol/L (ref 98–111)
Chloride: 105 mmol/L (ref 98–111)
Chloride: 103 mmol/L (ref 98–111)
Chloride: 106 mmol/L (ref 98–111)
Creatinine, Ser: 1.1 mg/dL (ref 0.61–1.24)
Creatinine, Ser: 1.1 mg/dL (ref 0.61–1.24)
Creatinine, Ser: 0.9 mg/dL (ref 0.61–1.24)
Creatinine, Ser: 0.9 mg/dL (ref 0.61–1.24)
Glucose, Bld: 103 mg/dL — ABNORMAL HIGH (ref 70–99)
Glucose, Bld: 117 mg/dL — ABNORMAL HIGH (ref 70–99)
Glucose, Bld: 108 mg/dL — ABNORMAL HIGH (ref 70–99)
Glucose, Bld: 128 mg/dL — ABNORMAL HIGH (ref 70–99)
HCT: 33 % — ABNORMAL LOW (ref 39.0–52.0)
HCT: 33 % — ABNORMAL LOW (ref 39.0–52.0)
HCT: 23 % — ABNORMAL LOW (ref 39.0–52.0)
HCT: 23 % — ABNORMAL LOW (ref 39.0–52.0)
Hemoglobin: 11.2 g/dL — ABNORMAL LOW (ref 13.0–17.0)
Hemoglobin: 11.2 g/dL — ABNORMAL LOW (ref 13.0–17.0)
Hemoglobin: 7.8 g/dL — ABNORMAL LOW (ref 13.0–17.0)
Hemoglobin: 7.8 g/dL — ABNORMAL LOW (ref 13.0–17.0)
Potassium: 4.2 mmol/L (ref 3.5–5.1)
Potassium: 4.4 mmol/L (ref 3.5–5.1)
Potassium: 5.4 mmol/L — ABNORMAL HIGH (ref 3.5–5.1)
Potassium: 5.2 mmol/L — ABNORMAL HIGH (ref 3.5–5.1)
Sodium: 139 mmol/L (ref 135–145)
Sodium: 139 mmol/L (ref 135–145)
Sodium: 138 mmol/L (ref 135–145)
Sodium: 137 mmol/L (ref 135–145)
TCO2: 27 mmol/L (ref 22–32)
TCO2: 24 mmol/L (ref 22–32)
TCO2: 27 mmol/L (ref 22–32)
TCO2: 27 mmol/L (ref 22–32)

## 2023-06-24 LAB — PROTIME-INR
INR: 1.3 — ABNORMAL HIGH (ref 0.8–1.2)
Prothrombin Time: 16.8 s — ABNORMAL HIGH (ref 11.4–15.2)

## 2023-06-24 LAB — POCT I-STAT EG7
Acid-Base Excess: 0 mmol/L (ref 0.0–2.0)
Bicarbonate: 23.3 mmol/L (ref 20.0–28.0)
Calcium, Ion: 1.03 mmol/L — ABNORMAL LOW (ref 1.15–1.40)
HCT: 25 % — ABNORMAL LOW (ref 39.0–52.0)
Hemoglobin: 8.5 g/dL — ABNORMAL LOW (ref 13.0–17.0)
O2 Saturation: 82 %
Potassium: 4.3 mmol/L (ref 3.5–5.1)
Sodium: 139 mmol/L (ref 135–145)
TCO2: 24 mmol/L (ref 22–32)
pCO2, Ven: 31.5 mm[Hg] — ABNORMAL LOW (ref 44–60)
pH, Ven: 7.478 — ABNORMAL HIGH (ref 7.25–7.43)
pO2, Ven: 42 mm[Hg] (ref 32–45)

## 2023-06-24 LAB — ECHO INTRAOPERATIVE TEE
AR max vel: 2.95 cm2
AV Area VTI: 3.47 cm2
AV Area mean vel: 2.89 cm2
AV Mean grad: 2 mm[Hg]
AV Peak grad: 4.5 mm[Hg]
Ao pk vel: 1.06 m/s
Area-P 1/2: 4.1 cm2
Height: 70 in
MV VTI: 4.72 cm2
S' Lateral: 3.7 cm
Weight: 3120 [oz_av]

## 2023-06-24 LAB — MAGNESIUM: Magnesium: 3.5 mg/dL — ABNORMAL HIGH (ref 1.7–2.4)

## 2023-06-24 LAB — HEPARIN LEVEL (UNFRACTIONATED): Heparin Unfractionated: 0.34 [IU]/mL (ref 0.30–0.70)

## 2023-06-24 LAB — HEMOGLOBIN AND HEMATOCRIT, BLOOD
HCT: 24.3 % — ABNORMAL LOW (ref 39.0–52.0)
Hemoglobin: 8.3 g/dL — ABNORMAL LOW (ref 13.0–17.0)

## 2023-06-24 LAB — APTT: aPTT: 33 s (ref 24–36)

## 2023-06-24 LAB — PLATELET COUNT: Platelets: 199 10*3/uL (ref 150–400)

## 2023-06-24 LAB — PHOSPHORUS: Phosphorus: 3.9 mg/dL (ref 2.5–4.6)

## 2023-06-24 SURGERY — CORONARY ARTERY BYPASS GRAFTING (CABG)
Anesthesia: General | Site: Chest

## 2023-06-24 MED ORDER — METOPROLOL TARTRATE 5 MG/5ML IV SOLN: 2.5000 mg | INTRAVENOUS | Status: DC | PRN

## 2023-06-24 MED ORDER — STERILE WATER FOR IRRIGATION IR SOLN
Status: DC | PRN
  Administered 2023-06-24: 2000 mL

## 2023-06-24 MED ORDER — DEXMEDETOMIDINE HCL IN NACL 400 MCG/100ML IV SOLN
INTRAVENOUS | Status: DC | PRN
  Administered 2023-06-24: .3 ug/kg/h via INTRAVENOUS

## 2023-06-24 MED ORDER — INSULIN REGULAR(HUMAN) IN NACL 100-0.9 UT/100ML-% IV SOLN
INTRAVENOUS | Status: DC | PRN
  Administered 2023-06-24: .9 [IU]/h via INTRAVENOUS

## 2023-06-24 MED ORDER — METOPROLOL TARTRATE 12.5 MG HALF TABLET
12.5000 mg | ORAL_TABLET | Freq: Two times a day (BID) | ORAL | Status: DC
  Administered 2023-06-25 – 2023-06-29 (×8): 12.5 mg via ORAL
  Filled 2023-06-24 (×8): qty 1

## 2023-06-24 MED ORDER — SODIUM CHLORIDE 0.45 % IV SOLN: INTRAVENOUS | Status: AC | PRN

## 2023-06-24 MED ORDER — DOBUTAMINE-DEXTROSE 4-5 MG/ML-% IV SOLN: 0.0000 ug/kg/min | INTRAVENOUS | Status: DC

## 2023-06-24 MED ORDER — PROPOFOL 10 MG/ML IV BOLUS
INTRAVENOUS | Status: DC | PRN
  Administered 2023-06-24: 30 mg via INTRAVENOUS
  Administered 2023-06-24: 100 mg via INTRAVENOUS
  Administered 2023-06-24: 20 mg via INTRAVENOUS

## 2023-06-24 MED ORDER — ONDANSETRON HCL 4 MG/2ML IJ SOLN
4.0000 mg | Freq: Four times a day (QID) | INTRAMUSCULAR | Status: DC | PRN
Start: 2023-06-24 — End: 2023-06-29
  Filled 2023-06-24: qty 2

## 2023-06-24 MED ORDER — SODIUM BICARBONATE 8.4 % IV SOLN
100.0000 meq | Freq: Once | INTRAVENOUS | Status: AC
  Administered 2023-06-24: 100 meq via INTRAVENOUS
  Filled 2023-06-24: qty 100

## 2023-06-24 MED ORDER — ASPIRIN 81 MG PO CHEW
324.0000 mg | CHEWABLE_TABLET | Freq: Once | ORAL | Status: AC
  Administered 2023-06-24: 324 mg via ORAL
  Filled 2023-06-24: qty 4

## 2023-06-24 MED ORDER — CHLORHEXIDINE GLUCONATE 0.12 % MT SOLN
15.0000 mL | Freq: Once | OROMUCOSAL | Status: DC
  Filled 2023-06-24: qty 15

## 2023-06-24 MED ORDER — PROPOFOL 500 MG/50ML IV EMUL
INTRAVENOUS | Status: DC | PRN
  Administered 2023-06-24: 30 ug/kg/min via INTRAVENOUS

## 2023-06-24 MED ORDER — SODIUM CHLORIDE 0.9% FLUSH: 3.0000 mL | INTRAVENOUS | Status: DC | PRN

## 2023-06-24 MED ORDER — ACETAMINOPHEN 160 MG/5ML PO SOLN
650.0000 mg | Freq: Once | ORAL | Status: AC
  Administered 2023-06-24: 650 mg
  Filled 2023-06-24: qty 20.3

## 2023-06-24 MED ORDER — NOREPINEPHRINE 4 MG/250ML-% IV SOLN
0.0000 ug/min | INTRAVENOUS | Status: DC
  Administered 2023-06-24: 11 ug/min via INTRAVENOUS
  Administered 2023-06-24: 2 ug/min via INTRAVENOUS
  Administered 2023-06-25: 9 ug/min via INTRAVENOUS
  Filled 2023-06-24 (×2): qty 250

## 2023-06-24 MED ORDER — SODIUM CHLORIDE 0.9% FLUSH
10.0000 mL | Freq: Two times a day (BID) | INTRAVENOUS | Status: DC
  Administered 2023-06-24 – 2023-06-29 (×9): 10 mL via INTRAVENOUS

## 2023-06-24 MED ORDER — BISACODYL 10 MG RE SUPP: 10.0000 mg | Freq: Every day | RECTAL | Status: DC

## 2023-06-24 MED ORDER — CEFAZOLIN SODIUM-DEXTROSE 2-4 GM/100ML-% IV SOLN
2.0000 g | Freq: Three times a day (TID) | INTRAVENOUS | Status: AC
  Administered 2023-06-24 – 2023-06-26 (×6): 2 g via INTRAVENOUS
  Filled 2023-06-24 (×6): qty 100

## 2023-06-24 MED ORDER — OXYCODONE HCL 5 MG PO TABS: 5.0000 mg | ORAL_TABLET | ORAL | Status: DC | PRN

## 2023-06-24 MED ORDER — CHLORHEXIDINE GLUCONATE CLOTH 2 % EX PADS
6.0000 | MEDICATED_PAD | Freq: Every day | CUTANEOUS | Status: DC
  Administered 2023-06-24 – 2023-06-28 (×4): 6 via TOPICAL

## 2023-06-24 MED ORDER — TRAMADOL HCL 50 MG PO TABS
50.0000 mg | ORAL_TABLET | ORAL | Status: DC | PRN
  Administered 2023-06-24 – 2023-06-25 (×4): 100 mg via ORAL
  Filled 2023-06-24 (×5): qty 2

## 2023-06-24 MED ORDER — ALBUMIN HUMAN 5 % IV SOLN
250.0000 mL | INTRAVENOUS | Status: DC | PRN
Start: 2023-06-24 — End: 2023-06-27
  Administered 2023-06-24 (×3): 12.5 g via INTRAVENOUS
  Filled 2023-06-24 (×3): qty 250

## 2023-06-24 MED ORDER — PROPOFOL 10 MG/ML IV BOLUS
INTRAVENOUS | Status: AC
Start: 2023-06-24 — End: ?
  Filled 2023-06-24: qty 20

## 2023-06-24 MED ORDER — MIDAZOLAM HCL (PF) 5 MG/ML IJ SOLN
INTRAMUSCULAR | Status: DC | PRN
  Administered 2023-06-24: 2 mg via INTRAVENOUS
  Administered 2023-06-24: 1 mg via INTRAVENOUS
  Administered 2023-06-24: 2 mg via INTRAVENOUS
  Administered 2023-06-24: 1 mg via INTRAVENOUS

## 2023-06-24 MED ORDER — MORPHINE SULFATE (PF) 2 MG/ML IV SOLN: 1.0000 mg | INTRAVENOUS | Status: DC | PRN

## 2023-06-24 MED ORDER — ROCURONIUM BROMIDE 100 MG/10ML IV SOLN
INTRAVENOUS | Status: DC | PRN
  Administered 2023-06-24: 70 mg via INTRAVENOUS
  Administered 2023-06-24: 50 mg via INTRAVENOUS
  Administered 2023-06-24: 30 mg via INTRAVENOUS

## 2023-06-24 MED ORDER — MIDAZOLAM HCL (PF) 10 MG/2ML IJ SOLN
INTRAMUSCULAR | Status: AC
Start: 2023-06-24 — End: ?
  Filled 2023-06-24: qty 2

## 2023-06-24 MED ORDER — LACTATED RINGERS IV SOLN: INTRAVENOUS | Status: AC

## 2023-06-24 MED ORDER — METOPROLOL TARTRATE 25 MG/10 ML ORAL SUSPENSION
12.5000 mg | Freq: Two times a day (BID) | ORAL | Status: DC
  Filled 2023-06-24 (×3): qty 5

## 2023-06-24 MED ORDER — ONDANSETRON HCL 4 MG/2ML IJ SOLN
INTRAMUSCULAR | Status: DC | PRN
  Administered 2023-06-24: 4 mg via INTRAVENOUS

## 2023-06-24 MED ORDER — MAGNESIUM SULFATE 4 GM/100ML IV SOLN
4.0000 g | Freq: Once | INTRAVENOUS | Status: AC
  Administered 2023-06-24: 4 g via INTRAVENOUS
  Filled 2023-06-24: qty 100

## 2023-06-24 MED ORDER — DEXMEDETOMIDINE HCL IN NACL 400 MCG/100ML IV SOLN: 0.0000 ug/kg/h | INTRAVENOUS | Status: DC

## 2023-06-24 MED ORDER — INSULIN REGULAR(HUMAN) IN NACL 100-0.9 UT/100ML-% IV SOLN: INTRAVENOUS | Status: DC

## 2023-06-24 MED ORDER — SODIUM CHLORIDE (PF) 0.9 % IJ SOLN
OROMUCOSAL | Status: DC | PRN
  Administered 2023-06-24: 8 mL via TOPICAL

## 2023-06-24 MED ORDER — HEPARIN SODIUM (PORCINE) 1000 UNIT/ML IJ SOLN
INTRAMUSCULAR | Status: AC
  Filled 2023-06-24: qty 1

## 2023-06-24 MED ORDER — SODIUM CHLORIDE 0.9% FLUSH
3.0000 mL | Freq: Two times a day (BID) | INTRAVENOUS | Status: DC
  Administered 2023-06-25 – 2023-06-28 (×7): 3 mL via INTRAVENOUS

## 2023-06-24 MED ORDER — ACETAMINOPHEN 160 MG/5ML PO SOLN: 1000.0000 mg | Freq: Four times a day (QID) | ORAL | Status: DC

## 2023-06-24 MED ORDER — LACTATED RINGERS IV SOLN: INTRAVENOUS | Status: DC | PRN

## 2023-06-24 MED ORDER — PROTAMINE SULFATE 10 MG/ML IV SOLN
INTRAVENOUS | Status: DC | PRN
  Administered 2023-06-24: 310 mg via INTRAVENOUS

## 2023-06-24 MED ORDER — PROPOFOL 1000 MG/100ML IV EMUL
INTRAVENOUS | Status: AC
  Filled 2023-06-24: qty 100

## 2023-06-24 MED ORDER — CHLORHEXIDINE GLUCONATE 0.12 % MT SOLN
15.0000 mL | OROMUCOSAL | Status: AC
  Administered 2023-06-24: 15 mL via OROMUCOSAL
  Filled 2023-06-24: qty 15

## 2023-06-24 MED ORDER — PANTOPRAZOLE SODIUM 40 MG IV SOLR
40.0000 mg | Freq: Every day | INTRAVENOUS | Status: AC
  Administered 2023-06-24 – 2023-06-25 (×2): 40 mg via INTRAVENOUS
  Filled 2023-06-24 (×2): qty 10

## 2023-06-24 MED ORDER — POTASSIUM CHLORIDE 10 MEQ/50ML IV SOLN: 10.0000 meq | INTRAVENOUS | Status: AC

## 2023-06-24 MED ORDER — SODIUM CHLORIDE 0.9 % IV SOLN: INTRAVENOUS | Status: DC | PRN

## 2023-06-24 MED ORDER — FENTANYL CITRATE (PF) 100 MCG/2ML IJ SOLN
INTRAMUSCULAR | Status: DC | PRN
  Administered 2023-06-24: 125 ug via INTRAVENOUS
  Administered 2023-06-24 (×2): 100 ug via INTRAVENOUS
  Administered 2023-06-24: 25 ug via INTRAVENOUS
  Administered 2023-06-24: 100 ug via INTRAVENOUS
  Administered 2023-06-24: 50 ug via INTRAVENOUS

## 2023-06-24 MED ORDER — ORAL CARE MOUTH RINSE: 15.0000 mL | Freq: Once | OROMUCOSAL | Status: DC

## 2023-06-24 MED ORDER — PLASMA-LYTE A IV SOLN
INTRAVENOUS | Status: DC | PRN
  Administered 2023-06-24: 500 mL

## 2023-06-24 MED ORDER — PROTAMINE SULFATE 10 MG/ML IV SOLN
INTRAVENOUS | Status: AC
Start: 2023-06-24 — End: ?
  Filled 2023-06-24: qty 25

## 2023-06-24 MED ORDER — SODIUM CHLORIDE 0.9 % IV SOLN: INTRAVENOUS | Status: AC

## 2023-06-24 MED ORDER — SODIUM BICARBONATE 8.4 % IV SOLN
100.0000 meq | Freq: Once | INTRAVENOUS | Status: AC
  Administered 2023-06-24: 100 meq via INTRAVENOUS

## 2023-06-24 MED ORDER — VANCOMYCIN HCL IN DEXTROSE 1-5 GM/200ML-% IV SOLN
1000.0000 mg | Freq: Once | INTRAVENOUS | Status: AC
  Administered 2023-06-24: 1000 mg via INTRAVENOUS
  Filled 2023-06-24: qty 200

## 2023-06-24 MED ORDER — PANTOPRAZOLE SODIUM 40 MG PO TBEC
40.0000 mg | DELAYED_RELEASE_TABLET | Freq: Every day | ORAL | Status: DC
  Administered 2023-06-26 – 2023-06-29 (×4): 40 mg via ORAL
  Filled 2023-06-24 (×4): qty 1

## 2023-06-24 MED ORDER — ACETAMINOPHEN 500 MG PO TABS
1000.0000 mg | ORAL_TABLET | Freq: Four times a day (QID) | ORAL | Status: DC
  Administered 2023-06-24 – 2023-06-29 (×18): 1000 mg via ORAL
  Filled 2023-06-24 (×18): qty 2

## 2023-06-24 MED ORDER — DOCUSATE SODIUM 100 MG PO CAPS
200.0000 mg | ORAL_CAPSULE | Freq: Every day | ORAL | Status: DC
  Administered 2023-06-25 – 2023-06-26 (×2): 200 mg via ORAL
  Filled 2023-06-24 (×2): qty 2

## 2023-06-24 MED ORDER — BISACODYL 5 MG PO TBEC
10.0000 mg | DELAYED_RELEASE_TABLET | Freq: Every day | ORAL | Status: DC
  Administered 2023-06-25 – 2023-06-26 (×2): 10 mg via ORAL
  Filled 2023-06-24 (×2): qty 2

## 2023-06-24 MED ORDER — HEPARIN SODIUM (PORCINE) 1000 UNIT/ML IJ SOLN
INTRAMUSCULAR | Status: DC | PRN
  Administered 2023-06-24: 31000 [IU] via INTRAVENOUS

## 2023-06-24 MED ORDER — METOCLOPRAMIDE HCL 5 MG/ML IJ SOLN
10.0000 mg | Freq: Four times a day (QID) | INTRAMUSCULAR | Status: AC
  Administered 2023-06-24 – 2023-06-25 (×5): 10 mg via INTRAVENOUS
  Filled 2023-06-24 (×6): qty 2

## 2023-06-24 MED ORDER — 0.9 % SODIUM CHLORIDE (POUR BTL) OPTIME
TOPICAL | Status: DC | PRN
  Administered 2023-06-24: 5000 mL

## 2023-06-24 MED ORDER — DEXTROSE 50 % IV SOLN: 0.0000 mL | INTRAVENOUS | Status: DC | PRN

## 2023-06-24 MED ORDER — SUGAMMADEX SODIUM 200 MG/2ML IV SOLN
INTRAVENOUS | Status: DC | PRN
  Administered 2023-06-24: 200 mg via INTRAVENOUS

## 2023-06-24 MED ORDER — ASPIRIN 81 MG PO CHEW: 324.0000 mg | CHEWABLE_TABLET | Freq: Every day | ORAL | Status: DC

## 2023-06-24 MED ORDER — FENTANYL CITRATE (PF) 250 MCG/5ML IJ SOLN
INTRAMUSCULAR | Status: AC
  Filled 2023-06-24: qty 5

## 2023-06-24 MED ORDER — NICARDIPINE HCL IN NACL 20-0.86 MG/200ML-% IV SOLN: 0.0000 mg/h | INTRAVENOUS | Status: DC

## 2023-06-24 MED ORDER — PROTAMINE SULFATE 10 MG/ML IV SOLN
INTRAVENOUS | Status: AC
Start: 2023-06-24 — End: ?
  Filled 2023-06-24: qty 5

## 2023-06-24 MED ORDER — MIDAZOLAM HCL 2 MG/2ML IJ SOLN
2.0000 mg | INTRAMUSCULAR | Status: DC | PRN
Start: 2023-06-24 — End: 2023-06-25

## 2023-06-24 MED ORDER — LACTATED RINGERS IV SOLN: INTRAVENOUS | Status: DC

## 2023-06-24 MED ORDER — ASPIRIN 325 MG PO TBEC: 325.0000 mg | DELAYED_RELEASE_TABLET | Freq: Every day | ORAL | Status: DC

## 2023-06-24 SURGICAL SUPPLY — 83 items
BAG DECANTER FOR FLEXI CONT (MISCELLANEOUS) ×3 IMPLANT
BLADE CLIPPER SURG (BLADE) ×3 IMPLANT
BLADE STERNUM SYSTEM 6 (BLADE) ×3 IMPLANT
BNDG ELASTIC 4X5.8 VLCR STR LF (GAUZE/BANDAGES/DRESSINGS) ×3 IMPLANT
BNDG ELASTIC 6INX 5YD STR LF (GAUZE/BANDAGES/DRESSINGS) IMPLANT
BNDG ELASTIC 6X5.8 VLCR STR LF (GAUZE/BANDAGES/DRESSINGS) ×3 IMPLANT
BNDG GAUZE DERMACEA FLUFF 4 (GAUZE/BANDAGES/DRESSINGS) ×3 IMPLANT
CABLE SURGICAL S-101-97-12 (CABLE) ×3 IMPLANT
CANISTER SUCT 3000ML PPV (MISCELLANEOUS) ×3 IMPLANT
CANNULA MC2 2 STG 29/37 NON-V (CANNULA) ×3 IMPLANT
CANNULA NON VENT 20FR 12 (CANNULA) ×3 IMPLANT
CATH ROBINSON RED A/P 18FR (CATHETERS) ×6 IMPLANT
CLIP RETRACTION 3.0MM CORONARY (MISCELLANEOUS) IMPLANT
CLIP TI MEDIUM 24 (CLIP) IMPLANT
CLIP TI WIDE RED SMALL 24 (CLIP) IMPLANT
CONN ST 1/2X1/2 BEN (MISCELLANEOUS) ×3 IMPLANT
CONNECTOR BLAKE 2:1 CARIO BLK (MISCELLANEOUS) ×3 IMPLANT
CONTAINER PROTECT SURGISLUSH (MISCELLANEOUS) ×6 IMPLANT
DERMABOND ADVANCED .7 DNX12 (GAUZE/BANDAGES/DRESSINGS) IMPLANT
DRAIN CHANNEL 19F RND (DRAIN) ×9 IMPLANT
DRAIN CONNECTOR BLAKE 1:1 (MISCELLANEOUS) ×3 IMPLANT
DRAPE CARDIOVASCULAR INCISE (DRAPES) ×2
DRAPE INCISE IOBAN 66X45 STRL (DRAPES) IMPLANT
DRAPE SRG 135X102X78XABS (DRAPES) ×3 IMPLANT
DRAPE WARM FLUID 44X44 (DRAPES) ×3 IMPLANT
DRSG AQUACEL AG ADV 3.5X10 (GAUZE/BANDAGES/DRESSINGS) ×3 IMPLANT
ELECT BLADE 4.0 EZ CLEAN MEGAD (MISCELLANEOUS) ×2
ELECT REM PT RETURN 9FT ADLT (ELECTROSURGICAL) ×4
ELECTRODE BLDE 4.0 EZ CLN MEGD (MISCELLANEOUS) ×3 IMPLANT
ELECTRODE REM PT RTRN 9FT ADLT (ELECTROSURGICAL) ×6 IMPLANT
FELT TEFLON 1X6 (MISCELLANEOUS) ×6 IMPLANT
GAUZE 4X4 16PLY ~~LOC~~+RFID DBL (SPONGE) ×3 IMPLANT
GAUZE SPONGE 4X4 12PLY STRL (GAUZE/BANDAGES/DRESSINGS) ×6 IMPLANT
GLOVE BIO SURGEON STRL SZ7 (GLOVE) ×6 IMPLANT
GLOVE BIOGEL M STRL SZ7.5 (GLOVE) ×6 IMPLANT
GLOVE SS BIOGEL STRL SZ 7.5 (GLOVE) IMPLANT
GOWN STRL REUS W/ TWL LRG LVL3 (GOWN DISPOSABLE) ×12 IMPLANT
GOWN STRL REUS W/ TWL XL LVL3 (GOWN DISPOSABLE) ×6 IMPLANT
GOWN STRL REUS W/TWL LRG LVL3 (GOWN DISPOSABLE) ×8
GOWN STRL REUS W/TWL XL LVL3 (GOWN DISPOSABLE) ×4
HEMOSTAT POWDER SURGIFOAM 1G (HEMOSTASIS) ×6 IMPLANT
INSERT FOGARTY XLG (MISCELLANEOUS) IMPLANT
INSERT SUTURE HOLDER (MISCELLANEOUS) ×3 IMPLANT
KIT BASIN OR (CUSTOM PROCEDURE TRAY) ×3 IMPLANT
KIT SUCTION CATH 14FR (SUCTIONS) ×3 IMPLANT
KIT TURNOVER KIT B (KITS) ×3 IMPLANT
KIT VASOVIEW HEMOPRO 2 VH 4000 (KITS) ×3 IMPLANT
LEAD PACING MYOCARDI (MISCELLANEOUS) ×3 IMPLANT
MARKER GRAFT CORONARY BYPASS (MISCELLANEOUS) ×9 IMPLANT
NS IRRIG 1000ML POUR BTL (IV SOLUTION) ×15 IMPLANT
PACK E OPEN HEART (SUTURE) ×3 IMPLANT
PACK OPEN HEART (CUSTOM PROCEDURE TRAY) ×3 IMPLANT
PAD ARMBOARD 7.5X6 YLW CONV (MISCELLANEOUS) ×6 IMPLANT
PAD ELECT DEFIB RADIOL ZOLL (MISCELLANEOUS) ×3 IMPLANT
PENCIL BUTTON HOLSTER BLD 10FT (ELECTRODE) ×3 IMPLANT
POSITIONER HEAD DONUT 9IN (MISCELLANEOUS) ×3 IMPLANT
PUNCH AORTIC ROTATE 4.0MM (MISCELLANEOUS) ×3 IMPLANT
SET MPS 3-ND DEL (MISCELLANEOUS) IMPLANT
SPONGE T-LAP 18X18 ~~LOC~~+RFID (SPONGE) ×12 IMPLANT
STOPCOCK 4 WAY LG BORE MALE ST (IV SETS) IMPLANT
SUPPORT HEART JANKE-BARRON (MISCELLANEOUS) ×3 IMPLANT
SUT BONE WAX W31G (SUTURE) ×3 IMPLANT
SUT ETHIBOND X763 2 0 SH 1 (SUTURE) ×6 IMPLANT
SUT MNCRL AB 3-0 PS2 18 (SUTURE) ×6 IMPLANT
SUT PDS AB 1 CTX 36 (SUTURE) ×6 IMPLANT
SUT PROLENE 4 0 RB 1 (SUTURE) ×6
SUT PROLENE 4 0 SH DA (SUTURE) ×3 IMPLANT
SUT PROLENE 4-0 RB1 .5 CRCL 36 (SUTURE) IMPLANT
SUT PROLENE 5 0 C 1 36 (SUTURE) ×9 IMPLANT
SUT PROLENE 7 0 BV1 MDA (SUTURE) ×3 IMPLANT
SUT STEEL 6MS V (SUTURE) ×6 IMPLANT
SUT VIC AB 2-0 CT1 27 (SUTURE) ×2
SUT VIC AB 2-0 CT1 TAPERPNT 27 (SUTURE) IMPLANT
SUT VICRYL 3-0 27 CRC X-1 (SUTURE) IMPLANT
SYSTEM SAHARA CHEST DRAIN ATS (WOUND CARE) ×3 IMPLANT
TAPE CLOTH 2X10 TAN LF (GAUZE/BANDAGES/DRESSINGS) IMPLANT
TOWEL GREEN STERILE (TOWEL DISPOSABLE) ×3 IMPLANT
TOWEL GREEN STERILE FF (TOWEL DISPOSABLE) ×3 IMPLANT
TRAY FOLEY SLVR 16FR TEMP STAT (SET/KITS/TRAYS/PACK) ×3 IMPLANT
TUBING ANTICOAG CELL SAVER (IV SETS) IMPLANT
TUBING LAP HI FLOW INSUFFLATIO (TUBING) ×3 IMPLANT
UNDERPAD 30X36 HEAVY ABSORB (UNDERPADS AND DIAPERS) ×3 IMPLANT
WATER STERILE IRR 1000ML POUR (IV SOLUTION) ×6 IMPLANT

## 2023-06-24 NOTE — Anesthesia Postprocedure Evaluation (Signed)
Anesthesia Post Note  Patient: Clinton [name]  Procedure(s) Performed: CORONARY ARTERY BYPASS GRAFTING (CABG) X TWO USING LEFT INTERNAL MAMMARY ARTERY AND RIGHT GREATER SAPHENOUS VEIN HARVESTED ENDOSCOPICALLY (Chest) TRANSESOPHAGEAL ECHOCARDIOGRAM     Patient location during evaluation: SICU Anesthesia Type: General Level of consciousness: sedated Pain management: pain level controlled Vital Signs Assessment: post-procedure vital signs reviewed and stable Respiratory status: patient remains intubated per anesthesia plan Cardiovascular status: stable Postop Assessment: no apparent nausea or vomiting Anesthetic complications: no   No notable events documented.  Last Vitals:  Vitals:   06/24/23 1539 06/24/23 1633  BP: (!) 129/49 (!) 175/54  Pulse: 72 88  Resp: (!) 29 (!) 25  Temp: 36.5 C 37.6 C  SpO2: 100% 97%    Last Pain:  Vitals:   06/24/23 0728  TempSrc:   PainSc: 0-No pain                 [name] P Lajuanda Penick

## 2023-06-24 NOTE — Anesthesia Procedure Notes (Signed)
Procedure Name: Intubation Date/Time: 06/24/2023 9:00 AM  Performed by: Alease Medina, CRNAPre-anesthesia Checklist: Patient identified, Emergency Drugs available, Suction available and Patient being monitored Patient Re-evaluated:Patient Re-evaluated prior to induction Oxygen Delivery Method: Circle system utilized Preoxygenation: Pre-oxygenation with 100% oxygen Induction Type: IV induction Ventilation: Mask ventilation without difficulty Laryngoscope Size: Mac and 4 Grade View: Grade II Tube type: Oral Tube size: 8.0 mm Number of attempts: 1 Airway Equipment and Method: Stylet Placement Confirmation: ETT inserted through vocal cords under direct vision, positive ETCO2 and breath sounds checked- equal and bilateral Secured at: 23 cm Tube secured with: Tape Dental Injury: Teeth and Oropharynx as per pre-operative assessment

## 2023-06-24 NOTE — Anesthesia Procedure Notes (Signed)
Arterial Line Insertion Start/End11/13/2024 8:00 AM Performed by: Alease Medina, CRNA, CRNA  Patient location: Pre-op. Preanesthetic checklist: patient identified, IV checked, site marked, risks and benefits discussed, surgical consent, monitors and equipment checked, pre-op evaluation, timeout performed and anesthesia consent Lidocaine 1% used for infiltration Left, radial was placed Catheter size: 20 G Hand hygiene performed  and maximum sterile barriers used   Attempts: 1 Procedure performed without using ultrasound guided technique. Following insertion, dressing applied and Biopatch. Post procedure assessment: normal and unchanged  Patient tolerated the procedure well with no immediate complications.

## 2023-06-24 NOTE — Op Note (Signed)
[address]       [place] [address]             [phone]                                          06/24/2023 Patient:  Clinton [name] Pre-Op Dx: Left Main CAD HTN PVD HLP   Post-op Dx:  same Procedure: CABG X 2.  LIMA LAD, RSVG Diagonal   Endoscopic greater saphenous vein harvest on the right   Surgeon and Role:      * Mikail Goostree, Eliezer Lofts, MD - Primary    Webb Laws , PA-C - assisting An experienced assistant was required given the complexity of this surgery and the standard of surgical care. The assistant was needed for exposure, dissection, suctioning, retraction of delicate tissues and sutures, instrument exchange and for overall help during this procedure.    Anesthesia  general EBL:  Blood Administration: none Xclamp Time:  33 min Pump Time:   Drains: 19 F blake drain: L, mediastinal  Wires: ventricular Counts: correct   Indications: 67yo male with LM disease.  Echocardiogram shows preserved biventricular function and no significant valvular disease.  On review of his LHC, he does not have good lateral wall targets, but there is a dominant diagonal.  The LAD also appears to be a good target.  He will need a plavix washout prior to surgery.    Findings: Good LIMA and vein.  Good LAD.  Intramyocardial diagonal  Operative Technique: All invasive lines were placed in pre-op holding.  After the risks, benefits and alternatives were thoroughly discussed, the patient was brought to the operative theatre.  Anesthesia was induced, and the patient was prepped and draped in normal sterile fashion.  An appropriate surgical pause was performed, and pre-operative antibiotics were dosed accordingly.  We began with simultaneous incisions along the right leg for harvesting of the greater saphenous vein and the chest for the sternotomy.  In regards to the sternotomy, this was carried down with bovie cautery, and the sternum was divided with  a reciprocating saw.  Meticulous hemostasis was obtained.  The left internal thoracic artery was exposed and harvested in in pedicled fashion.  The patient was systemically heparinized, and the artery was divided distally, and placed in a papaverine sponge.    The sternal elevator was removed, and a retractor was placed.  The pericardium was divided in the midline and fashioned into a cradle with pericardial stitches.   After we confirmed an appropriate ACT, the ascending aorta was cannulated in standard fashion.  The right atrial appendage was used for venous cannulation site.  Cardiopulmonary bypass was initiated, and the heart retractor was placed.  Next, we exposed the anterior wall of the heart and identified a good target on diagonal.   An arteriotomy was created.  The vein was anastomosed in an end to side fashion.  Finally, we exposed a good target on the LAD, and fashioned an end to side anastomosis between it and the LITA.  We began to re-warm, and a re-animation dose of cardioplegia was given.  The heart was de-aired, and the cross clamp was removed.  Meticulous hemostasis was obtained.    A partial occludding clamp was then placed on the ascending aorta, and we created an end to side anastomosis between it  and the proximal vein graft.  Rings were placed on the proximal anastomosis.  Hemostasis was obtained, and we separated from cardiopulmonary bypass without event.  The heparin was reversed with protamine.  Chest tubes and wires were placed, and the sternum was re-approximated with sternal wires.  The soft tissue and skin were re-approximated wth absorbable suture.    The patient tolerated the procedure without any immediate complications, and was transferred to the ICU in guarded condition.  Mone Commisso Keane Scrape

## 2023-06-24 NOTE — Procedures (Signed)
Extubation Procedure Note  Patient Details:   Name: Clinton [name] DOB: 1956/02/25 MRN: [id number]   Airway Documentation:    Vent end date: 06/24/23 Vent end time: 1630   Evaluation  O2 sats: stable throughout Complications: No apparent complications Patient did tolerate procedure well. Bilateral Breath Sounds: Clear, Diminished   Yes  Positive cuff leak noted, NIF -34, VC 780 mL. Patient placed on [place] 4L with humidity, no stridor noted. Patient able to reach 500 mL using the incentive spirometer.  Rayburn Felt 06/24/2023, 4:45 PM

## 2023-06-24 NOTE — Brief Op Note (Signed)
06/15/2023 - 06/24/2023  2:07 PM  PATIENT:  Clinton [name]  67 y.o. male  PRE-OPERATIVE DIAGNOSIS:  CORONARY ARTERY DISEASE  POST-OPERATIVE DIAGNOSIS:  CORONARY ARTERY DISEASE  PROCEDURE:  Procedure(s): CORONARY ARTERY BYPASS GRAFTING (CABG) X TWO USING LEFT INTERNAL MAMMARY ARTERY AND RIGHT GREATER SAPHENOUS VEIN HARVESTED ENDOSCOPICALLY (N/A) TRANSESOPHAGEAL ECHOCARDIOGRAM (N/A) Vein harvest time: Vein prep time: LIMA-LAD, SVG-diagonal SURGEON:  Surgeons and Role:    * Lightfoot, Eliezer Lofts, MD - Primary  PHYSICIAN ASSISTANT: Chett Taniguchi PA-C  ASSISTANTS: KAYLA HAYES RN   ANESTHESIA:   general  EBL:  415 ml   BLOOD ADMINISTERED:none  DRAINS:  LEFT PLEURAL AND MEDIASTINAL CHEST TUBES    LOCAL MEDICATIONS USED:  NONE  SPECIMEN:  No Specimen  DISPOSITION OF SPECIMEN:  N/A  COUNTS:  YES  TOURNIQUET:  * No tourniquets in log *  DICTATION: .Dragon Dictation  PLAN OF CARE: Admit to inpatient   PATIENT DISPOSITION:  ICU - intubated and hemodynamically stable.   Delay start of Pharmacological VTE agent (>24hrs) due to surgical blood loss or risk of bleeding: yes  COMPLICATIONS: NO KNOWN

## 2023-06-24 NOTE — Anesthesia Procedure Notes (Signed)
Central Venous Catheter Insertion Performed by: Atilano Median, DO, anesthesiologist Start/End11/13/2024 7:55 AM, 06/24/2023 8:10 AM Patient location: Pre-op. Preanesthetic checklist: patient identified, IV checked, site marked, risks and benefits discussed, surgical consent, monitors and equipment checked, pre-op evaluation, timeout performed and anesthesia consent Position: Trendelenburg Lidocaine 1% used for infiltration and patient sedated Hand hygiene performed  and maximum sterile barriers used  Catheter size: 8.5 Fr Central line was placed.Sheath introducer Procedure performed using ultrasound guided technique. Ultrasound Notes:anatomy identified, needle tip was noted to be adjacent to the nerve/plexus identified, no ultrasound evidence of intravascular and/or intraneural injection and image(s) printed for medical record Attempts: 1 Following insertion, line sutured, dressing applied and Biopatch. Post procedure assessment: blood return through all ports, free fluid flow and no air  Patient tolerated the procedure well with no immediate complications.

## 2023-06-24 NOTE — Hospital Course (Addendum)
Referring: [name], MD Primary Care: Darrow Bussing, MD Primary Cardiologist:Jonathan Allyson Sabal, MD  History of Present Illness:    At the time of CT surgical consultation Mr. Latravis Mabile. Golin is a 67 year old gentleman with a past history notable for hypertension, dyslipidemia, peripheral vascular disease status post right below the knee amputation for critical limb ischemia about one year ago, cerebrovascular disease status post right carotid endarterectomy in 2017.  He recently presented to [name], PA-C for evaluation of exertional chest tightness.  An EKG was obtained obtained showing chronic right bundle branch block, minimal ST depression in V3 through V5.  Further evaluation with left heart catheterization was recommended and performed earlier today.  Coronary angiography demonstrates a 95% proximal left main coronary artery stenosis.  CT surgery has been asked to evaluate Mr. Halliwell for consideration of coronary bypass grafting.   Mr. Rauner is followed by [name], vascular surgery. He takes Plavix regularly for management of his vascular disease.  His most recent vascular procedure was 10/28/2022 per [name] and included shockwave intra-arterial lithotripsy of the left popliteal and left tibioperoneal trunk followed by drug-coated balloon angioplasty of the same vessels.  Last dose of Plavix was earlier today.    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  He enjoys playing golf.  He denies any current dental problems.  He is right-handed  Following full review of the patient and his studies Dr. Cliffton Asters recommended proceeding with CABG.  Hospital course: Following full diagnostic workup and medical stabilization the patient was felt to be acceptable to proceed with surgery and on 06/24/2023 he was taken the operating room at which time he underwent CABG x 2.  A LIMA-LAD graft was placed as well as a SVG-diagonal.  The patient tolerated the procedure well and was  taken to the surgical intensive care unit in stable condition.  Postoperative hospital course:  The patient was extubated using standard post cardiac surgical protocols without difficulty.  Postop hemodynamics have been good.  He did require some Levophed support but this was able to be weaned without difficulty.  He has an expected acute blood loss anemia which is moderate and being monitored clinically.  It is not in the transfusion threshold.  Oxygen has been weaned and he maintains good saturations on room air.  He does have some expected volume overload standard for procedure and not related to heart failure.  Early postoperatively his creatinine increased.  Postop day 2 was 1.84.  He was given albumin initially prior to initiating diuresis.

## 2023-06-24 NOTE — Consult Note (Signed)
NAME:  Clinton [name], MRN:  [id number], DOB:  [birth year], LOS: 9 ADMISSION DATE:  06/15/2023 CONSULTATION DATE:  06/24/2023 REFERRING MD:  Cliffton Asters - TCTS CHIEF COMPLAINT:  Post-CABG ventilator management   History of Present Illness:  Clinton [name] is seen in consultation at the request of Dr. Cliffton Asters (TCTS) for management of mechanical ventilation and hemodynamics post-CABG.  67 year old man who presented to [facility] 11/4 for LHC. PMHx significant for HTN, HLD, CAD, carotid artery occlusion (s/p CEA 08/2015), PVD (followed by [facility], s/p intra-arterial lithotripsy/angioplasty of L popliteal, L tibioperoneal trunk on Plavix, R BKA 2023.)   Underwent CABG x 2 (LIMA to LAD, RSVG diagonal) 11/14 post-Plavix washout. Intraoperative course was uncomplicated with EBL , X-clamp time , pump time .  PCCM consulted for postoperative ventilator management.  Pertinent Medical History:   Past Medical History:  Diagnosis Date   Carotid artery occlusion    Cellulitis    Erectile dysfunction    Hyperlipidemia    Hypertension    Peripheral vascular disease (HCC)    Significant Hospital Events: Including procedures, antibiotic start and stop dates in addition to other pertinent events   11/14 - CABG x 2 (LIMA to LAD, RSVG to diagonal). CCM consulted for MV/hemodynamics.  Interim History / Subjective:  PCCM consulted for vent management post-CABG.  Objective:  Blood pressure (!) 105/43, pulse (!) 53, temperature 98.5 F (36.9 C), temperature source Oral, resp. rate 16, height 5\' 10"  (1.778 m), weight 88.5 kg, SpO2 100%.    Vent Mode: SIMV;PSV FiO2 (%):  [50 %] 50 % Set Rate:  [16 bmp] 16 bmp Vt Set:  [580 mL] 580 mL PEEP:  [5 cmH20] 5 cmH20 Pressure Support:  [10 cmH20] 10 cmH20 Plateau Pressure:  [17 cmH20] 17 cmH20   Intake/Output Summary (Last 24 hours) at 06/24/2023 1238 Last data filed at 06/24/2023 1208 Gross per 24 hour  Intake 1700 ml  Output 975 ml  Net 725 ml    Filed Weights   06/15/23 0740 06/22/23 0439 06/24/23 0716  Weight: 88.9 kg 86 kg 88.5 kg   Physical Examination: General: Acutely ill-appearing middle-aged man in NAD. Intubated, sedated. HEENT: [place]/AT, anicteric sclera, PERRL 2mm, moist mucous membranes. Neuro: Sedated. Does not respond to verbal, tactile or noxious stimuli. Does not withdraw to pain.Not following commands. No spontaneous movement of extremities noted on my exam. +Corneal, +Cough, and +Gag  CV: Mildly bradycardic, regular rhythm, no m/g/r. CT x 2, sanguinous output. PULM: Breathing even and unlabored on vent (PEEP 5, FiO2 50%, SIMV). Lung fields diminished at bases L > R. GI: Soft, nontender, nondistended. Hypoactive bowel sounds. Extremities: Trace LLE edema noted. R BKA noted, stump without edema/erythema. Skin: Warm/dry, no rashes.  Resolved Hospital Problem List:    Assessment & Plan:  Clinton [name] is seen in consultation at the request of Dr. Cliffton Asters (TCTS) for management of mechanical ventilation and hemodynamics post-CABG.  67 year old man who presented to [facility] 11/4 for LHC. PMHx significant for HTN, HLD, CAD, carotid artery occlusion (s/p CEA 08/2015), PVD (followed by [facility], s/p intra-arterial lithotripsy/angioplasty of L popliteal, L tibioperoneal trunk on Plavix, R BKA 2023.)   S/p CABG x 2 (LIMA to LAD, RSVG to diagonal) 11/14 with Dr. Cliffton Asters.   Postoperative vent management - Continue full vent support (4-8cc/kg IBW) - Wean FiO2 for O2 sat > 90% - Daily WUA/SBT, rapid wean with SIMV per protocol - VAP bundle - Pulmonary hygiene - F/u ABG - PAD protocol for sedation: Precedex and Fentanyl for  goal RASS 0 to -1  S/p 2-vessel CABG LIMA LAD, RSVG diagonal CAD - Monitor CI, CO - Levophed for vasopressor support - Insulin gtt per protocol - Continue ASA - Postoperative care per TCTS - CT management per protocol - Multimodal pain management (oxycodone/morphine, tramadol, APAP, lido  patches)  CAS S/p CEA PVD S/p R BKA - Resume statin/Zetia as appropriate  Best Practice: (right click and "Reselect all SmartList Selections" daily)   Diet/type: NPO DVT prophylaxis: SCDs GI prophylaxis: PPI Lines: Central line and Arterial Line Foley:  Yes, and it is still needed Code Status:  full code Last date of multidisciplinary goals of care discussion [Per Primary Team]  Labs:  CBC: Recent Labs  Lab 06/20/23 0334 06/21/23 0412 06/22/23 0543 06/23/23 0602 06/24/23 0654 06/24/23 0905 06/24/23 1058 06/24/23 1103 06/24/23 1114 06/24/23 1145 06/24/23 1148  WBC 5.7 5.0 5.4 5.3 5.8  --   --   --   --   --   --   HGB 11.6* 11.2* 11.9* 12.1* 12.2*   < > 7.5* 8.3* 7.8* 7.8* 8.5*  HCT 34.9* 33.9* 35.3* 35.3* 36.3*   < > 22.0* 24.3* 23.0* 23.0* 25.0*  MCV 96.7 96.0 96.7 96.2 96.0  --   --   --   --   --   --   PLT 213 206 214 205 214  --   --  199  --   --   --    < > = values in this interval not displayed.   Basic Metabolic Panel: Recent Labs  Lab 06/19/23 0330 06/22/23 0543 06/23/23 0602 06/24/23 0654 06/24/23 0905 06/24/23 0908 06/24/23 1016 06/24/23 1041 06/24/23 1045 06/24/23 1058 06/24/23 1114 06/24/23 1145 06/24/23 1148  NA 139 141 139 139 139   < > 139   < > 139 136 138 137 137  K 4.4 5.1 4.5 4.6 4.2   < > 4.4   < > 4.3 5.4* 5.4* 5.2* 5.2*  CL 109 108 106 106 104  --  105  --   --   --  103 106  --   CO2 27 27 26 26   --   --   --   --   --   --   --   --   --   GLUCOSE 95 96 95 104* 103*  --  117*  --   --   --  108* 128*  --   BUN 16 15 15 11 12   --  11  --   --   --  11 11  --   CREATININE 1.26* 1.26* 1.18 1.48* 1.10  --  1.10  --   --   --  0.90 0.90  --   CALCIUM 8.6* 9.2 9.0 9.1  --   --   --   --   --   --   --   --   --    < > = values in this interval not displayed.   GFR: Estimated Creatinine Clearance: 88 mL/min (by C-G formula based on SCr of 0.9 mg/dL). Recent Labs  Lab 06/21/23 0412 06/22/23 0543 06/23/23 0602 06/24/23 0654   WBC 5.0 5.4 5.3 5.8   Liver Function Tests: No results for input(s): "AST", "ALT", "ALKPHOS", "BILITOT", "PROT", "ALBUMIN" in the last 168 hours. No results for input(s): "LIPASE", "AMYLASE" in the last 168 hours. No results for input(s): "AMMONIA" in the last 168 hours.  ABG:    Component Value Date/Time  PHART 7.474 (H) 06/24/2023 1148   PCO2ART 32.9 06/24/2023 1148   PO2ART 464 (H) 06/24/2023 1148   HCO3 24.2 06/24/2023 1148   TCO2 25 06/24/2023 1148   ACIDBASEDEF 1.0 06/24/2023 0908   O2SAT 100 06/24/2023 1148    Coagulation Profile: Recent Labs  Lab 06/23/23 0602  INR 1.0   Cardiac Enzymes: No results for input(s): "CKTOTAL", "CKMB", "CKMBINDEX", "TROPONINI" in the last 168 hours.  HbA1C: Hgb A1c MFr Bld  Date/Time Value Ref Range Status  06/23/2023 06:02 AM 5.3 4.8 - 5.6 % Final    Comment:    (NOTE) Pre diabetes:          5.7%-6.4%  Diabetes:              >6.4%  Glycemic control for   <7.0% adults with diabetes   06/16/2023 08:43 AM 5.5 4.8 - 5.6 % Final    Comment:    (NOTE) Pre diabetes:          5.7%-6.4%  Diabetes:              >6.4%  Glycemic control for   <7.0% adults with diabetes    CBG: No results for input(s): "GLUCAP" in the last 168 hours.  Review of Systems:   Patient is encephalopathic and/or intubated; therefore, history has been obtained from chart review.   Past Medical History:  He,  has a past medical history of Carotid artery occlusion, Cellulitis, Erectile dysfunction, Hyperlipidemia, Hypertension, and Peripheral vascular disease (HCC).   Surgical History:   Past Surgical History:  Procedure Laterality Date   ABDOMINAL AORTOGRAM W/LOWER EXTREMITY N/A 02/04/2022   Procedure: ABDOMINAL AORTOGRAM W/LOWER EXTREMITY;  Surgeon: Nada Libman, MD;  Location: [facility];  Service: Cardiovascular;  Laterality: N/A;   ABDOMINAL AORTOGRAM W/LOWER EXTREMITY N/A 02/18/2022   Procedure: ABDOMINAL AORTOGRAM W/LOWER EXTREMITY;   Surgeon: Nada Libman, MD;  Location: [facility];  Service: Cardiovascular;  Laterality: N/A;   ABDOMINAL AORTOGRAM W/LOWER EXTREMITY N/A 04/15/2022   Procedure: ABDOMINAL AORTOGRAM W/LOWER EXTREMITY;  Surgeon: Nada Libman, MD;  Location: [facility];  Service: Cardiovascular;  Laterality: N/A;   ABDOMINAL AORTOGRAM W/LOWER EXTREMITY N/A 10/28/2022   Procedure: ABDOMINAL AORTOGRAM W/LOWER EXTREMITY;  Surgeon: Nada Libman, MD;  Location: [facility];  Service: Cardiovascular;  Laterality: N/A;   AMPUTATION Right 04/16/2022   Procedure: RIGHT 5TH RAY AMPUTATION;  Surgeon: Nadara Mustard, MD;  [facility] OR;  Service: Orthopedics;  Laterality: Right;   AMPUTATION Right 05/09/2022   Procedure: RIGHT BELOW KNEE AMPUTATION;  Surgeon: Nadara Mustard, MD;  Location: [facility] OR;  Service: Orthopedics;  Laterality: Right;   APPLICATION OF WOUND VAC Right 06/20/2022   Procedure: APPLICATION OF WOUND VAC;  Surgeon: Nadara Mustard, MD;  Location: [facility] OR;  Service: Orthopedics;  Laterality: Right;   COLONOSCOPY     ENDARTERECTOMY Right 09/05/2015   Procedure: ENDARTERECTOMY CAROTID RIGHT;  Surgeon: Sherren Kerns, MD;  Location: [facility] OR;  Service: Vascular;  Laterality: Right;   LEFT HEART CATH AND CORONARY ANGIOGRAPHY N/A 06/15/2023   Procedure: LEFT HEART CATH AND CORONARY ANGIOGRAPHY;  Surgeon: [name], MD;  Location: [facility];  Service: Cardiovascular;  Laterality: N/A;   PATCH ANGIOPLASTY  09/05/2015   Procedure: PATCH ANGIOPLASTY RIGHT CAROTID ARTERY USING HEMASHIELD PLATINUM FINESSE PATCH;  Surgeon: Sherren Kerns, MD;  Location[facility] OR;  Service: Vascular;;   PERIPHERAL INTRAVASCULAR LITHOTRIPSY Left 10/28/2022  Procedure: INTRAVASCULAR LITHOTRIPSY;  Surgeon: Nada Libman, MD;  Location: [facility];  Service: Cardiovascular;  Laterality: Left;  L peroneal   PERIPHERAL VASCULAR ATHERECTOMY Left 02/18/2022   Procedure: PERIPHERAL VASCULAR  ATHERECTOMY;  Surgeon: Nada Libman, MD;  Location: [facility];  Service: Cardiovascular;  Laterality: Left;  Popliteal and Peroneal   PERIPHERAL VASCULAR BALLOON ANGIOPLASTY  02/04/2022   Procedure: PERIPHERAL VASCULAR BALLOON ANGIOPLASTY;  Surgeon: Nada Libman, MD;  Location: [facility];  Service: Cardiovascular;;   PERIPHERAL VASCULAR BALLOON ANGIOPLASTY Right 04/15/2022   Procedure: PERIPHERAL VASCULAR BALLOON ANGIOPLASTY;  Surgeon: Nada Libman, MD;  [facility]: [facility];  Service: Cardiovascular;  Laterality: Right;  SFA/POPLITEAL   PERIPHERAL VASCULAR BALLOON ANGIOPLASTY Left 10/28/2022   Procedure: PERIPHERAL VASCULAR BALLOON ANGIOPLASTY;  Surgeon: Nada Libman, MD;  [facility]: [facility];  Service: Cardiovascular;  Laterality: Left;  L peroneal and L pop   STUMP REVISION Right 06/20/2022   Procedure: REVISION RIGHT BELOW KNEE AMPUTATION;  Surgeon: Nadara Mustard, MD;  Location: [facility] OR;  Service: Orthopedics;  Laterality: Right;   TONSILLECTOMY     VASECTOMY     Social History:   reports that he has never smoked. He quit smokeless tobacco use about 8 years ago.  His smokeless tobacco use included chew. He reports that he does not currently use alcohol.   Family History:  His family history includes Deep vein thrombosis in his father and mother; Diabetes in his father; Heart attack in his father and mother; Heart disease in his father and mother; Hyperlipidemia in his brother and father; Hypertension in his brother and father; Other in his mother; Peripheral vascular disease in his father.   Allergies: Allergies  Allergen Reactions   Penicillins Rash    Has patient had a PCN reaction causing immediate rash, facial/tongue/throat swelling, SOB or lightheadedness with hypotension: Yes Has patient had a PCN reaction causing severe rash involving mucus membranes or skin necrosis: No Has patient had a PCN reaction that required hospitalization  No Has patient had a PCN reaction occurring within the last 10 years: No If all of the above answers are "NO", then may proceed with Cephalosporin use.  Tolerated Cefazolin on 04/16/22    Sulfa Antibiotics Swelling and Other (See Comments)    Facial/lip swelling   Home Medications: Prior to Admission medications   Medication Sig Start Date End Date Taking? Authorizing Provider  amLODipine (NORVASC) 2.5 MG tablet Take 2.5 mg by mouth at bedtime. 05/06/21  Yes [provider]  aspirin EC 81 MG tablet Take 81 mg by mouth at bedtime.   Yes [provider]  atorvastatin (LIPITOR) 80 MG tablet Take 80 mg by mouth at bedtime. 02/25/17  Yes [provider]  clopidogrel (PLAVIX) 75 MG tablet Take 1 tablet (75 mg total) by mouth daily. 02/17/23  Yes Nada Libman, MD  irbesartan-hydrochlorothiazide (AVALIDE) 300-12.5 MG tablet Take 1 tablet by mouth at bedtime. 05/10/21  Yes [provider]  Multiple Vitamin (MULTIVITAMIN) capsule Take 2 capsules by mouth every morning. Gummy   Yes [provider]  nitroGLYCERIN (NITROSTAT) 0.4 MG SL tablet Place 1 tablet (0.4 mg total) under the tongue every 5 (five) minutes as needed for chest pain. 06/09/23 09/07/23 Yes Meng, Wynema Birch, PA  Omega-3 Fatty Acids (OMEGA 3 PO) Take 2 capsules by mouth daily. 454 mg each   Yes [provider]  Probiotic Product (PROBIOTIC PO) Take 220 mg  by mouth daily.   Yes [provider]  vitamin C (ASCORBIC ACID) 250 MG tablet Take 500 mg by mouth daily.   Yes [provider]  zinc sulfate 220 (50 Zn) MG capsule Take 1 capsule (220 mg total) by mouth daily. 05/16/22  Yes Love, Evlyn Kanner, PA-C  terbinafine (LAMISIL) 250 MG tablet Take 250 mg by mouth daily. 05/18/23   [provider]    Critical care time:   The patient is critically ill with multiple organ system failure and requires high complexity decision making for assessment and support, frequent evaluation  and titration of therapies, advanced monitoring, review of radiographic studies and interpretation of complex data.   Critical Care Time devoted to patient care services, exclusive of separately billable procedures, described in this note is 31 minutes.  Tim Lair, PA-C  Pulmonary & Critical Care 06/24/23 12:38 PM  Please see [email or web address] for pager details.  From 7A-7P if no response, please call [phone] After hours, please call ELink [phone]

## 2023-06-24 NOTE — Transfer of Care (Signed)
Immediate Anesthesia Transfer of Care Note  Patient: Clinton [name]  Procedure(s) Performed: CORONARY ARTERY BYPASS GRAFTING (CABG) X TWO USING LEFT INTERNAL MAMMARY ARTERY AND RIGHT GREATER SAPHENOUS VEIN HARVESTED ENDOSCOPICALLY (Chest) TRANSESOPHAGEAL ECHOCARDIOGRAM  Patient Location: ICU  Anesthesia Type:General  Level of Consciousness: sedated and Patient remains intubated per anesthesia plan  Airway & Oxygen Therapy: Patient remains intubated per anesthesia plan and Patient placed on Ventilator (see vital sign flow sheet for setting)  Post-op Assessment: Report given to RN and Post -op Vital signs reviewed and stable  Post vital signs: Reviewed and stable  Last Vitals:  Vitals Value Taken Time  BP 100/60 06/24/23 1234  Temp 35.6 C 06/24/23 1239  Pulse 52 06/24/23 1239  Resp 3 06/24/23 1239  SpO2 99 % 06/24/23 1239  Vitals shown include unfiled device data.  Last Pain:  Vitals:   06/24/23 0728  TempSrc:   PainSc: 0-No pain      Patients Stated Pain Goal: 0 (06/22/23 1938)  Complications: No notable events documented.  Patient transported to ICU with standard monitors (HR, BP, SPO2, RR) and emergency drugs/equipment. Controlled ventilation maintained via ambu bag. Report given to bedside RN and respiratory therapist. Pt connected to ICU monitor and ventilator. All questions answered and vital signs stable before leaving

## 2023-06-24 NOTE — Progress Notes (Signed)
     [address]       [place] [address]             [phone]       No events  Vitals:   06/24/23 0727 06/24/23 0732  BP:    Pulse: (!) 51 (!) 58  Resp: 17 (!) 22  Temp:    SpO2: 100% 100%   Alert NAD Sinus brady EWOB  OR today for CABG  Jackee Glasner O Lysbeth Dicola

## 2023-06-25 ENCOUNTER — Encounter (HOSPITAL_COMMUNITY): Payer: Self-pay | Admitting: Thoracic Surgery (Cardiothoracic Vascular Surgery)

## 2023-06-25 ENCOUNTER — Inpatient Hospital Stay (HOSPITAL_COMMUNITY): Payer: 59

## 2023-06-25 DIAGNOSIS — I25119 Atherosclerotic heart disease of native coronary artery with unspecified angina pectoris: Secondary | ICD-10-CM | POA: Diagnosis not present

## 2023-06-25 LAB — MAGNESIUM
Magnesium: 2.7 mg/dL — ABNORMAL HIGH (ref 1.7–2.4)
Magnesium: 2.8 mg/dL — ABNORMAL HIGH (ref 1.7–2.4)

## 2023-06-25 LAB — CBC
HCT: 30 % — ABNORMAL LOW (ref 39.0–52.0)
HCT: 29.7 % — ABNORMAL LOW (ref 39.0–52.0)
Hemoglobin: 10.2 g/dL — ABNORMAL LOW (ref 13.0–17.0)
Hemoglobin: 9.8 g/dL — ABNORMAL LOW (ref 13.0–17.0)
MCH: 32.6 pg (ref 26.0–34.0)
MCH: 32.5 pg (ref 26.0–34.0)
MCHC: 34 g/dL (ref 30.0–36.0)
MCHC: 33 g/dL (ref 30.0–36.0)
MCV: 95.8 fL (ref 80.0–100.0)
MCV: 98.3 fL (ref 80.0–100.0)
Platelets: 219 10*3/uL (ref 150–400)
Platelets: 189 10*3/uL (ref 150–400)
RBC: 3.13 MIL/uL — ABNORMAL LOW (ref 4.22–5.81)
RBC: 3.02 MIL/uL — ABNORMAL LOW (ref 4.22–5.81)
RDW: 13 % (ref 11.5–15.5)
RDW: 13.3 % (ref 11.5–15.5)
WBC: 10 10*3/uL (ref 4.0–10.5)
WBC: 9.7 10*3/uL (ref 4.0–10.5)
nRBC: 0 % (ref 0.0–0.2)
nRBC: 0 % (ref 0.0–0.2)

## 2023-06-25 LAB — GLUCOSE, CAPILLARY
Glucose-Capillary: 100 mg/dL — ABNORMAL HIGH (ref 70–99)
Glucose-Capillary: 102 mg/dL — ABNORMAL HIGH (ref 70–99)
Glucose-Capillary: 105 mg/dL — ABNORMAL HIGH (ref 70–99)
Glucose-Capillary: 110 mg/dL — ABNORMAL HIGH (ref 70–99)
Glucose-Capillary: 111 mg/dL — ABNORMAL HIGH (ref 70–99)
Glucose-Capillary: 121 mg/dL — ABNORMAL HIGH (ref 70–99)
Glucose-Capillary: 127 mg/dL — ABNORMAL HIGH (ref 70–99)
Glucose-Capillary: 128 mg/dL — ABNORMAL HIGH (ref 70–99)
Glucose-Capillary: 132 mg/dL — ABNORMAL HIGH (ref 70–99)
Glucose-Capillary: 132 mg/dL — ABNORMAL HIGH (ref 70–99)
Glucose-Capillary: 134 mg/dL — ABNORMAL HIGH (ref 70–99)

## 2023-06-25 LAB — TYPE AND SCREEN
ABO/RH(D): A NEG
Antibody Screen: NEGATIVE
Unit division: 0
Unit division: 0

## 2023-06-25 LAB — BASIC METABOLIC PANEL
Anion gap: 7 (ref 5–15)
Anion gap: 9 (ref 5–15)
BUN: 9 mg/dL (ref 8–23)
BUN: 14 mg/dL (ref 8–23)
CO2: 25 mmol/L (ref 22–32)
CO2: 24 mmol/L (ref 22–32)
Calcium: 8.1 mg/dL — ABNORMAL LOW (ref 8.9–10.3)
Calcium: 8.3 mg/dL — ABNORMAL LOW (ref 8.9–10.3)
Chloride: 100 mmol/L (ref 98–111)
Chloride: 99 mmol/L (ref 98–111)
Creatinine, Ser: 1.34 mg/dL — ABNORMAL HIGH (ref 0.61–1.24)
Creatinine, Ser: 1.64 mg/dL — ABNORMAL HIGH (ref 0.61–1.24)
GFR, Estimated: 58 mL/min — ABNORMAL LOW (ref >60)
GFR, Estimated: 45 mL/min — ABNORMAL LOW (ref >60)
Glucose, Bld: 123 mg/dL — ABNORMAL HIGH (ref 70–99)
Glucose, Bld: 124 mg/dL — ABNORMAL HIGH (ref 70–99)
Potassium: 3.7 mmol/L (ref 3.5–5.1)
Potassium: 4.4 mmol/L (ref 3.5–5.1)
Sodium: 132 mmol/L — ABNORMAL LOW (ref 135–145)
Sodium: 132 mmol/L — ABNORMAL LOW (ref 135–145)

## 2023-06-25 LAB — BPAM RBC
Blood Product Expiration Date: 202411262359
Blood Product Expiration Date: 202411262359
Unit Type and Rh: 600
Unit Type and Rh: 600

## 2023-06-25 MED ORDER — ENOXAPARIN SODIUM 40 MG/0.4ML IJ SOSY
40.0000 mg | PREFILLED_SYRINGE | Freq: Every day | INTRAMUSCULAR | Status: DC
  Administered 2023-06-25 – 2023-06-28 (×4): 40 mg via SUBCUTANEOUS
  Filled 2023-06-25 (×4): qty 0.4

## 2023-06-25 MED ORDER — ASPIRIN 81 MG PO TBEC
81.0000 mg | DELAYED_RELEASE_TABLET | Freq: Every day | ORAL | Status: DC
  Administered 2023-06-25 – 2023-06-29 (×5): 81 mg via ORAL
  Filled 2023-06-25 (×6): qty 1

## 2023-06-25 MED ORDER — GABAPENTIN 300 MG PO CAPS
300.0000 mg | ORAL_CAPSULE | Freq: Two times a day (BID) | ORAL | Status: DC
  Administered 2023-06-25 – 2023-06-29 (×9): 300 mg via ORAL
  Filled 2023-06-25 (×9): qty 1

## 2023-06-25 MED ORDER — LACTATED RINGERS IV BOLUS
250.0000 mL | Freq: Once | INTRAVENOUS | Status: AC
  Administered 2023-06-25: 250 mL via INTRAVENOUS

## 2023-06-25 MED ORDER — ALBUMIN HUMAN 5 % IV SOLN
12.5000 g | Freq: Once | INTRAVENOUS | Status: AC
  Administered 2023-06-25: 12.5 g via INTRAVENOUS

## 2023-06-25 MED ORDER — INSULIN ASPART 100 UNIT/ML IJ SOLN
0.0000 [IU] | INTRAMUSCULAR | Status: DC
  Administered 2023-06-25 – 2023-06-26 (×4): 2 [IU] via SUBCUTANEOUS

## 2023-06-25 MED ORDER — CLOPIDOGREL BISULFATE 75 MG PO TABS
75.0000 mg | ORAL_TABLET | Freq: Every day | ORAL | Status: DC
  Administered 2023-06-25 – 2023-06-29 (×5): 75 mg via ORAL
  Filled 2023-06-25 (×5): qty 1

## 2023-06-25 MED ORDER — ALBUMIN HUMAN 5 % IV SOLN
12.5000 g | Freq: Once | INTRAVENOUS | Status: AC
  Administered 2023-06-25: 12.5 g via INTRAVENOUS
  Filled 2023-06-25: qty 250

## 2023-06-25 MED ORDER — POTASSIUM CHLORIDE 10 MEQ/50ML IV SOLN
10.0000 meq | INTRAVENOUS | Status: AC
  Administered 2023-06-25 (×3): 10 meq via INTRAVENOUS
  Filled 2023-06-25 (×3): qty 50

## 2023-06-25 NOTE — Progress Notes (Signed)
PCCM Brief Note  Notified by RN of oliguria with ~100cc UOP on todays shift. BP adequate off Levophed. Respiratory status stable, no dyspnea or increase in effort. Sats mid 90s on RA.  Received 12.5g of 5% Albumin earlier.  Have asked to repeat Albumin dose and also give 250 LR bolus. Mobilize as able. Continue to monitor UOP. Might try diuresis tomorrow.   Rutherford Guys, PA - C [facility] For pager details, please see AMION or use Epic chat  After 1900, please call [facility] for cross coverage needs 06/25/2023, 4:45 PM

## 2023-06-25 NOTE — Discharge Summary (Signed)
[address]       [place] [address]             [id number]    Physician Discharge Summary  Patient ID: [place] [name] MRN: [id number] DOB/AGE: 04/28/1956 67 y.o.  Admit date: 06/15/2023 Discharge date: 06/29/2023  Admission Diagnoses:  Patient Active Problem List   Diagnosis Date Noted   S/P CABG x 2 06/24/2023   Acute postoperative respiratory insufficiency 06/24/2023   Coronary artery disease involving native coronary artery of native heart with angina pectoris (HCC) 06/15/2023   Elevated coronary artery calcium score 08/06/2022   Dehiscence of amputation stump of right lower extremity (HCC) 06/20/2022   Acute blood loss anemia 05/19/2022   Post-op pain 05/19/2022   Acute renal failure (HCC) 05/19/2022   S/P BKA (below knee amputation), right (HCC) 05/14/2022   Below-knee amputation of right lower extremity (HCC) 05/09/2022   Gangrene of right foot (HCC)    PAD (peripheral artery disease) (HCC) 04/17/2022   Acute osteomyelitis of metatarsal bone of right foot (HCC)    Atherosclerosis of native arteries of the extremities with ulceration (HCC) 04/15/2022   Peripheral arterial disease (HCC) 03/25/2022   Obesity 03/21/2022   Prediabetes 03/21/2022   Erectile dysfunction 07/26/2021   Family history of ischemic heart disease (IHD) 07/26/2021   Pure hypercholesterolemia 07/26/2021   History of adenomatous polyp of colon 02/03/2019   Internal hemorrhoids 02/03/2019   Carotid artery stenosis, asymptomatic 09/05/2015   Cellulitis    Hypertension    Hyperlipidemia    Occlusion and stenosis of carotid artery without mention of cerebral infarction 01/01/2012     Discharge Diagnoses:  Patient Active Problem List   Diagnosis Date Noted   S/P CABG x 2 06/24/2023   Acute postoperative respiratory insufficiency 06/24/2023   Coronary artery disease involving native coronary artery of native heart with angina pectoris (HCC) 06/15/2023   Elevated  coronary artery calcium score 08/06/2022   Dehiscence of amputation stump of right lower extremity (HCC) 06/20/2022   Acute blood loss anemia 05/19/2022   Post-op pain 05/19/2022   Acute renal failure (HCC) 05/19/2022   S/P BKA (below knee amputation), right (HCC) 05/14/2022   Below-knee amputation of right lower extremity (HCC) 05/09/2022   Gangrene of right foot (HCC)    PAD (peripheral artery disease) (HCC) 04/17/2022   Acute osteomyelitis of metatarsal bone of right foot (HCC)    Atherosclerosis of native arteries of the extremities with ulceration (HCC) 04/15/2022   Peripheral arterial disease (HCC) 03/25/2022   Obesity 03/21/2022   Prediabetes 03/21/2022   Erectile dysfunction 07/26/2021   Family history of ischemic heart disease (IHD) 07/26/2021   Pure hypercholesterolemia 07/26/2021   History of adenomatous polyp of colon 02/03/2019   Internal hemorrhoids 02/03/2019   Carotid artery stenosis, asymptomatic 09/05/2015   Cellulitis    Hypertension    Hyperlipidemia    Occlusion and stenosis of carotid artery without mention of cerebral infarction 01/01/2012     Discharged Condition: good  Referring: [name], MD Primary Care: Darrow Bussing, MD Primary Cardiologist:Jonathan Allyson Sabal, MD  History of Present Illness:    At the time of CT surgical consultation Mr. Jasias Hardiman. Raj is a 67 year old gentleman with a past history notable for hypertension, dyslipidemia, peripheral vascular disease status post right below the knee amputation for critical limb ischemia about one year ago, cerebrovascular disease status post right carotid endarterectomy in 2017.  He recently presented to [name], PA-C  for evaluation of exertional chest tightness.  An EKG was obtained obtained showing chronic right bundle branch block, minimal ST depression in V3 through V5.  Further evaluation with left heart catheterization was recommended and performed earlier today.  Coronary angiography  demonstrates a 95% proximal left main coronary artery stenosis.  CT surgery has been asked to evaluate Mr. Kelley for consideration of coronary bypass grafting.   Mr. Facenda is followed by [name], vascular surgery. He takes Plavix regularly for management of his vascular disease.  His most recent vascular procedure was 10/28/2022 per [name] and included shockwave intra-arterial lithotripsy of the left popliteal and left tibioperoneal trunk followed by drug-coated balloon angioplasty of the same vessels.  Last dose of Plavix was earlier today.    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  He enjoys playing golf.  He denies any current dental problems.  He is right-handed  Following full review of the patient and his studies Dr. Cliffton Asters recommended proceeding with CABG.  Hospital course: Following full diagnostic workup and medical stabilization the patient was felt to be acceptable to proceed with surgery and on 06/24/2023 he was taken the operating room at which time he underwent CABG x 2.  A LIMA-LAD graft was placed as well as a SVG-diagonal.  The patient tolerated the procedure well and was taken to the surgical intensive care unit in stable condition.  Postoperative hospital course:  The patient was extubated using standard post cardiac surgical protocols without difficulty.  Postop hemodynamics have been good.  He did require some Levophed support but this was able to be weaned without difficulty.  He has an expected acute blood loss anemia which is moderate and being monitored clinically.  It is not in the transfusion threshold.  Oxygen has been weaned and he maintains good saturations on room air.  He does have some expected volume overload standard for procedure and not related to heart failure.  Early postoperatively his creatinine increased.  Postop day 2 was 1.84.  He was given albumin initially prior to initiating diuresis.  He was able to start diuresis and his  creatinine is trending lower.  It has gotten to 1.35.  He will require further diuresis as an outpatient as he remains somewhat edematous and approximately 6 kg greater than preop.  He underwent removal of chest tubes and lines. He was maintaining NSR.  Blood pressure is under adequate control. He was stable for transfer to the progressive care unit on 06/28/2023.  He continued to make progress and is doing well in regards to ambulating on his prosthetic limb.  Incisions are noted to be healing well without evidence of infection.  He is tolerating diet.  Oxygen has been weaned and he maintains good saturations on room air.  Overall, at the time of discharge he is felt to be quite stable.  Consults: pulmonary/intensive care  Significant Diagnostic Studies:  DG Chest Port 1 View  Result Date: 06/26/2023 CLINICAL DATA:  [id number] Pneumothorax [id number] EXAM: PORTABLE CHEST 1 VIEW COMPARISON:  Chest x-ray 06/25/2023 5:49 a.m. FINDINGS: Right chest wall central venous catheter with tip overlying the distal superior vena cava/region of the superior cavoatrial junction. The heart and mediastinal contours are unchanged. Atherosclerotic plaque. Question developing bilateral lower lobe patchy airspace opacities. No pleural effusion. No pneumothorax. No acute osseous abnormality.  Sternotomy wires noted. IMPRESSION: 1. Right chest wall central venous catheter with tip overlying the distal superior vena cava/region of the superior cavoatrial junction. 2. Question developing  bilateral lower lobe patchy airspace opacities. Electronically Signed   By: Tish Frederickson M.D.   On: 06/26/2023 10:06   DG Chest Port 1 View  Result Date: 06/25/2023 CLINICAL DATA:  Status post CABG. EXAM: PORTABLE CHEST 1 VIEW COMPARISON:  Chest x-ray from yesterday. FINDINGS: Interval removal of the endotracheal and enteric tubes. Unchanged right internal jugular central venous catheter, mediastinal drain, and left chest tube. Stable  cardiomediastinal silhouette. Similar pulmonary vascular congestion. 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 Slightly increased mild right basilar atelectasis. Unchanged medial left basilar atelectasis. No pneumothorax. No acute osseous abnormality. IMPRESSION: 1. Interval extubation. Slightly increased mild right basilar atelectasis. Unchanged left basilar atelectasis. 2. Similar pulmonary vascular congestion. Electronically Signed   By: Obie Dredge M.D.   On: 06/25/2023 10:07   ECHO INTRAOPERATIVE TEE  Result Date: 06/24/2023  *INTRAOPERATIVE TRANSESOPHAGEAL REPORT *  Patient Name:   DANO SARABIA Date of Exam: 06/24/2023 Medical Rec #:  [id number]         Height:       70.0 in Accession #:    [id number]        Weight:       195.0 lb Date of Birth:  [birth year]        BSA:          2.06 m Patient Age:    67 years          BP:           123/65 mmHg Patient Gender: M                 HR:           45 bpm. Exam Location:  Anesthesiology Transesophogeal exam was perform intraoperatively during surgical procedure. Patient was closely monitored under general anesthesia during the entirety of examination. Indications:     CAD Performing Phys: [id number] HARRELL O LIGHTFOOT Complications: No known complications during this procedure. POST-OP IMPRESSIONS _ Left Ventricle: The left ventricle is unchanged from pre-bypass. _ Right Ventricle: The right ventricle appears unchanged from pre-bypass. _ Aorta: The aorta appears unchanged from pre-bypass. _ Left Atrial Appendage: The left atrial appendage appears unchanged from pre-bypass. _ Aortic Valve: The aortic valve appears unchanged from pre-bypass. _ Mitral Valve: The mitral valve appears unchanged from pre-bypass. _ Tricuspid Valve: The tricuspid valve appears unchanged from pre-bypass. _ Pulmonic Valve: The pulmonic valve appears unchanged from pre-bypass. _ Interatrial Septum: The interatrial septum appears  unchanged from pre-bypass. _ Pericardium: The pericardium appears unchanged from pre-bypass. _ Comments: - S/P CABG X 2. No new or worsening wall motion or valvular issues. PRE-OP FINDINGS  Left Ventricle: The left ventricle has normal systolic function, with an ejection fraction of 60-65%. The cavity size was normal. No evidence of left ventricular regional wall motion abnormalities. Left ventricular diastolic parameters were normal. Right Ventricle: The right ventricle has normal systolic function. The cavity was normal. There is no increase in right ventricular wall thickness. Left Atrium: Left atrial size was normal in size. No left atrial/left atrial appendage thrombus was detected. Left atrial appendage velocity is normal at greater than 40 cm/s. Right Atrium: Right atrial size was normal in size. Interatrial Septum: No atrial level shunt detected by color flow Doppler. The interatrial septum appears to be lipomatous. There is no evidence of a patent foramen ovale. Pericardium: There is no evidence of pericardial effusion. There is no pleural effusion. Mitral Valve: The mitral valve is normal in structure.  Mitral valve regurgitation is trivial by color flow Doppler. There is no evidence of mitral valve vegetation. There is No evidence of mitral stenosis. Tricuspid Valve: The tricuspid valve was normal in structure. Tricuspid valve regurgitation is trivial by color flow Doppler. No evidence of tricuspid stenosis is present. There is no evidence of tricuspid valve vegetation. Aortic Valve: The aortic valve is tricuspid Aortic valve regurgitation was not visualized by color flow Doppler. There is no stenosis of the aortic valve, with a calculated valve area of 3.47 cm. There is no evidence of aortic valve vegetation. Pulmonic Valve: The pulmonic valve was normal in structure. Pulmonic valve regurgitation is trivial by color flow Doppler. Aorta: The aortic root and ascending aorta are normal in size and  structure. Venous: The inferior vena cava is normal in size with less than 50% respiratory variability, suggesting right atrial pressure of 8 mmHg. Shunts: There is no evidence of an atrial septal defect. +--------------+--------++ LEFT VENTRICLE          +----------------+---------++ +--------------+--------++  Diastology                PLAX 2D                 +----------------+---------++ +--------------+--------++  LV e' lateral:  7.31 cm/s LVIDd:        5.20 cm   +----------------+---------++ +--------------+--------++  LV E/e' lateral:8.3       LVIDs:        3.70 cm   +----------------+---------++ +--------------+--------++  LV e' medial:   6.28 cm/s LV PW:        0.90 cm   +----------------+---------++ +--------------+--------++  LV E/e' medial: 9.7       LV IVS:       1.00 cm   +----------------+---------++ +--------------+--------++ LVOT diam:    2.60 cm   +----------------------+-------++ +--------------+--------++  2D Longitudinal Strain        LV SV:        71 ml     +----------------------+-------++ +--------------+--------++  2D Strain GLS (A2C):  -13.7 % LV SV Index:  33.87     +----------------------+-------++ +--------------+--------++  2D Strain GLS (A3C):  -14.4 % LVOT Area:    5.31 cm  +----------------------+-------++ +--------------+--------++  2D Strain GLS (A4C):  -10.6 %                         +----------------------+-------++ +--------------+--------++  2D Strain GLS Avg:    -12.9 %                             +----------------------+-------++ +---------------+---------+---------+ RIGHT VENTRICLE                   +---------------+---------+---------+ RV S prime:    6.79 cm/s14.4 cm/s +---------------+---------+---------+ TAPSE (M-mode):1.6 cm   2.37 cm   +---------------+---------+---------+ +------------------+-----------++ AORTIC VALVE                  +------------------+-----------++  AV Area (Vmax):   2.95 cm    +------------------+-----------++ AV Area (Vmean):  2.89 cm    +------------------+-----------++ AV Area (VTI):    3.47 cm    +------------------+-----------++ AV Vmax:          106.00 cm/s +------------------+-----------++ AV Vmean:         71.000 cm/s +------------------+-----------++ AV VTI:           0.257 m     +------------------+-----------++ AV Peak  Grad:     4.5 mmHg    +------------------+-----------++ AV Mean Grad:     2.0 mmHg    +------------------+-----------++ LVOT Vmax:        58.80 cm/s  +------------------+-----------++ LVOT Vmean:       38.700 cm/s +------------------+-----------++ LVOT VTI:         0.168 m     +------------------+-----------++ LVOT/AV VTI ratio:0.65        +------------------+-----------++  +--------------+-------++ AORTA                 +--------------+-------++ Ao Sinus diam:3.80 cm +--------------+-------++ Ao STJ diam:  2.4 cm  +--------------+-------++ Ao Asc diam:  2.90 cm +--------------+-------++ +--------------+----------++ MITRAL VALVE              +--------------+-------+ +--------------+----------++  SHUNTS                MV Area (PHT):4.10 cm    +--------------+-------+ +--------------+----------++  Systemic VTI: 0.17 m  MV Peak grad: 1.4 mmHg    +--------------+-------+ +--------------+----------++  Systemic Diam:2.60 cm MV Mean grad: 0.0 mmHg    +--------------+-------+ +--------------+----------++ MV Vmax:      0.60 m/s   +--------------+----------++ MV Vmean:     19.9 cm/s  +--------------+----------++ MV VTI:       0.19 m     +--------------+----------++ MV PHT:       53.65 msec +--------------+----------++ MV Decel Time:185 msec   +--------------+----------++ +--------------+----------++ MV E velocity:61.00 cm/s +--------------+----------++ MV A velocity:42.00 cm/s +--------------+----------++ MV E/A ratio: 1.45        +--------------+----------++  Hester Mates Electronically signed by Hester Mates Signature Date/Time: 06/24/2023/4:38:21 PM    Final    DG Chest Port 1 View  Result Date: 06/24/2023 CLINICAL DATA:  Postop EXAM: PORTABLE CHEST 1 VIEW COMPARISON:  Chest x-ray 06/22/2023 FINDINGS: There is a new endotracheal tube 3 cm above the carina. Right-sided central venous catheter tip projects over the SVC. Swan-Ganz catheter projects over the left main pulmonary artery. Enteric tube extends below the diaphragm. There are new sternotomy wires and left-sided chest tube. The cardiac silhouette is enlarged, unchanged. There central pulmonary vascular congestion. There are linear atelectatic changes in the left lung. Small nodular densities in the lateral right lung persists. No pneumothorax visualized. IMPRESSION: 1. New postoperative changes with support apparatus as above. 2. Stable cardiomegaly with central pulmonary vascular congestion. 3. Linear atelectatic changes in the left lung. Electronically Signed   By: Darliss Cheney M.D.   On: 06/24/2023 16:14   EP STUDY  Result Date: 06/24/2023 See surgical note for result.  DG Chest 2 View  Result Date: 06/22/2023 CLINICAL DATA:  History of CABG EXAM: CHEST - 2 VIEW COMPARISON:  Cardiac CT dated July 08, 2022 FINDINGS: The heart size and mediastinal contours are within normal limits for exam technique. Calcified lymph nodes and reticulonodular opacities. Visualized skeletal structures are unremarkable. IMPRESSION: 1. No evidence of acute cardiopulmonary disease. 2. Calcified lymph nodes and reticulonodular opacities, likely due to prior granulomatous disease when compared with the prior cardiac CT. Nonemergent ILD protocol chest CT could be performed for further evaluation. Electronically Signed   By: Allegra Lai M.D.   On: 06/22/2023 18:22   VAS US CAROTID  Result Date: 06/16/2023 Carotid Arterial Duplex Study Patient Name:  MANLEY TUNKS  Date of Exam:   06/16/2023 Medical Rec #: [id number]          Accession #:    [id number] Date of Birth: [birth year]  Patient Gender: M Patient Age:   73 years Exam Location:  [facility] Procedure:      VAS US CAROTID Referring Phys: HARRELL LIGHTFOOT --------------------------------------------------------------------------------  Indications:       Carotid artery disease and right endarterectomy. Risk Factors:      Hypertension, hyperlipidemia, past history of smoking, PAD. Limitations        Today's exam was limited due to Endartectomy scar. Comparison Study:  Previous study on 7.15.2024 Performing Technologist: Fernande Bras  Examination Guidelines: A complete evaluation includes B-mode imaging, spectral Doppler, color Doppler, and power Doppler as needed of all accessible portions of each vessel. Bilateral testing is considered an integral part of a complete examination. Limited examinations for reoccurring indications may be performed as noted.  Right Carotid Findings: +----------+--------+--------+--------+-------------------------+--------+           PSV cm/sEDV cm/sStenosisPlaque Description       Comments +----------+--------+--------+--------+-------------------------+--------+ CCA Prox  142     17                                                +----------+--------+--------+--------+-------------------------+--------+ CCA Distal182     31              calcific and heterogenous         +----------+--------+--------+--------+-------------------------+--------+ ICA Prox  134     28                                                +----------+--------+--------+--------+-------------------------+--------+ ICA Mid   122     29                                                +----------+--------+--------+--------+-------------------------+--------+ ICA Distal115     23                                                 +----------+--------+--------+--------+-------------------------+--------+ ECA       139     14                                                +----------+--------+--------+--------+-------------------------+--------+ +----------+--------+-------+--------+-------------------+           PSV cm/sEDV cmsDescribeArm Pressure (mmHG) +----------+--------+-------+--------+-------------------+ Subclavian116     17                                 +----------+--------+-------+--------+-------------------+ +---------+--------+--+--------+-+ VertebralPSV cm/s34EDV cm/s9 +---------+--------+--+--------+-+  Left Carotid Findings: +----------+--------+--------+--------+-------------------------+--------+           PSV cm/sEDV cm/sStenosisPlaque Description       Comments +----------+--------+--------+--------+-------------------------+--------+ CCA Prox  118     22                                                +----------+--------+--------+--------+-------------------------+--------+  CCA Distal175     25              calcific and heterogenous         +----------+--------+--------+--------+-------------------------+--------+ ICA Prox  131     24              calcific                          +----------+--------+--------+--------+-------------------------+--------+ ICA Mid   150     33                                                +----------+--------+--------+--------+-------------------------+--------+ ICA Distal147     46                                                +----------+--------+--------+--------+-------------------------+--------+ ECA       269     43                                                +----------+--------+--------+--------+-------------------------+--------+ +----------+--------+--------+--------+-------------------+           PSV cm/sEDV cm/sDescribeArm Pressure (mmHG)  +----------+--------+--------+--------+-------------------+ [id number]     18                                  +----------+--------+--------+--------+-------------------+ +---------+--------+--+--------+--+ VertebralPSV cm/s95EDV cm/s20 +---------+--------+--+--------+--+   Summary: Right Carotid: Velocities in the right ICA are consistent with a 1-39% stenosis.                The ECA appears <50% stenosed. Left Carotid: Velocities in the left ICA are consistent with a 1-39% stenosis.               The ECA appears >50% stenosed. Vertebrals:  Bilateral vertebral arteries demonstrate antegrade flow. Subclavians: Normal flow hemodynamics were seen in bilateral subclavian              arteries. *See table(s) above for measurements and observations.  Electronically signed by Lemar Livings MD on 06/16/2023 at 1:00:25 PM.    Final    ECHOCARDIOGRAM COMPLETE  Result Date: 06/16/2023    ECHOCARDIOGRAM REPORT   Patient Name:   PROMISE KERIN Date of Exam: 06/16/2023 Medical Rec #:  [id number]         Height:       70.0 in Accession #:    [id number]        Weight:       196.0 lb Date of Birth:  September 10, 1955        BSA:          2.069 m Patient Age:    67 years          BP:           136/60 mmHg Patient Gender: M                 HR:           81 bpm. Exam Location:  Inpatient  Procedure: 2D Echo, Color Doppler, Cardiac Doppler and Intracardiac            Opacification Agent Indications:    Pre-op cardiovascular exam  History:        Patient has prior history of Echocardiogram examinations, most                 recent 07/09/2022. CAD, PAD and Carotid Disease; Risk                 Factors:Hypertension and Dyslipidemia.  Sonographer:    Milbert Coulter Referring Phys: [id number] HARRELL O LIGHTFOOT IMPRESSIONS  1. Left ventricular ejection fraction, by estimation, is 55 to 60%. The left ventricle has normal function. The left ventricle has no regional wall motion abnormalities. Left ventricular diastolic parameters are  consistent with Grade I diastolic dysfunction (impaired relaxation).  2. Right ventricular systolic function is normal. The right ventricular size is normal. There is normal pulmonary artery systolic pressure. The estimated right ventricular systolic pressure is 34.6 mmHg.  3. The mitral valve is grossly normal. Trivial mitral valve regurgitation. No evidence of mitral stenosis.  4. The aortic valve is tricuspid. Aortic valve regurgitation is not visualized. No aortic stenosis is present.  5. The inferior vena cava is normal in size with greater than 50% respiratory variability, suggesting right atrial pressure of 3 mmHg. Comparison(s): No significant change from prior study. FINDINGS  Left Ventricle: Left ventricular ejection fraction, by estimation, is 55 to 60%. The left ventricle has normal function. The left ventricle has no regional wall motion abnormalities. Definity contrast agent was given IV to delineate the left ventricular  endocardial borders. The left ventricular internal cavity size was normal in size. There is no left ventricular hypertrophy. Left ventricular diastolic parameters are consistent with Grade I diastolic dysfunction (impaired relaxation). Right Ventricle: The right ventricular size is normal. No increase in right ventricular wall thickness. Right ventricular systolic function is normal. There is normal pulmonary artery systolic pressure. The tricuspid regurgitant velocity is 2.81 m/s, and  with an assumed right atrial pressure of 3 mmHg, the estimated right ventricular systolic pressure is 34.6 mmHg. Left Atrium: Left atrial size was normal in size. Right Atrium: Right atrial size was normal in size. Pericardium: Trivial pericardial effusion is present. Presence of epicardial fat layer. Mitral Valve: The mitral valve is grossly normal. Trivial mitral valve regurgitation. No evidence of mitral valve stenosis. Tricuspid Valve: The tricuspid valve is grossly normal. Tricuspid valve  regurgitation is trivial. No evidence of tricuspid stenosis. Aortic Valve: The aortic valve is tricuspid. Aortic valve regurgitation is not visualized. No aortic stenosis is present. Aortic valve mean gradient measures 4.0 mmHg. Aortic valve peak gradient measures 7.0 mmHg. Aortic valve area, by VTI measures 2.84 cm. Pulmonic Valve: The pulmonic valve was grossly normal. Pulmonic valve regurgitation is not visualized. No evidence of pulmonic stenosis. Aorta: The aortic root and ascending aorta are structurally normal, with no evidence of dilitation. Venous: The inferior vena cava is normal in size with greater than 50% respiratory variability, suggesting right atrial pressure of 3 mmHg. IAS/Shunts: The atrial septum is grossly normal.  LEFT VENTRICLE PLAX 2D LVIDd:         5.00 cm   Diastology LVIDs:         2.40 cm   LV e' medial:    6.09 cm/s LV PW:         1.00 cm   LV E/e' medial:  10.9 LV IVS:  1.10 cm   LV e' lateral:   10.90 cm/s LVOT diam:     2.30 cm   LV E/e' lateral: 6.1 LV SV:         70 LV SV Index:   34 LVOT Area:     4.15 cm  RIGHT VENTRICLE RV Basal diam:  2.90 cm RV S prime:     17.10 cm/s TAPSE (M-mode): 1.8 cm LEFT ATRIUM             Index        RIGHT ATRIUM           Index LA diam:        4.20 cm 2.03 cm/m   RA Area:     10.40 cm LA Vol (A2C):   51.3 ml 24.79 ml/m  RA Volume:   19.00 ml  9.18 ml/m LA Vol (A4C):   42.6 ml 20.58 ml/m LA Biplane Vol: 48.7 ml 23.53 ml/m  AORTIC VALVE AV Area (Vmax):    2.66 cm AV Area (Vmean):   2.52 cm AV Area (VTI):     2.84 cm AV Vmax:           132.00 cm/s AV Vmean:          88.200 cm/s AV VTI:            0.246 m AV Peak Grad:      7.0 mmHg AV Mean Grad:      4.0 mmHg LVOT Vmax:         84.40 cm/s LVOT Vmean:        53.600 cm/s LVOT VTI:          0.168 m LVOT/AV VTI ratio: 0.68  AORTA Ao Root diam: 3.70 cm Ao Asc diam:  3.50 cm MITRAL VALVE               TRICUSPID VALVE MV Area (PHT): 3.39 cm    TR Peak grad:   31.6 mmHg MV Decel Time: 224 msec     TR Vmax:        281.00 cm/s MV E velocity: 66.30 cm/s MV A velocity: 85.30 cm/s  SHUNTS MV E/A ratio:  0.78        Systemic VTI:  0.17 m                            Systemic Diam: 2.30 cm Lennie Odor MD Electronically signed by Lennie Odor MD Signature Date/Time: 06/16/2023/10:31:49 AM    Final    CARDIAC CATHETERIZATION  Result Date: 06/15/2023   Ost LM to Mid LM lesion is 95% stenosed.   1st Diag lesion is 50% stenosed. 1.  95% left main lesion.  2.  LVEDP of 36 mmHg. Recommendation: Will admit and obtain cardiothoracic surgical evaluation.  Will administer Lasix 20 mg IV x 1 given elevated LVEDP.      Results for orders placed or performed during the hospital encounter of 06/15/23 (from the past 48 hour(s))  Basic metabolic panel     Status: Abnormal   Collection Time: 06/28/23  3:11 AM  Result Value Ref Range   Sodium 136 135 - 145 mmol/L   Potassium 4.0 3.5 - 5.1 mmol/L   Chloride 104 98 - 111 mmol/L   CO2 25 22 - 32 mmol/L   Glucose, Bld 99 70 - 99 mg/dL    Comment: Glucose reference range applies only to samples taken after fasting for at  least 8 hours.   BUN 18 8 - 23 mg/dL   Creatinine, Ser 1.61 (H) 0.61 - 1.24 mg/dL   Calcium 8.2 (L) 8.9 - 10.3 mg/dL   GFR, Estimated 57 (L) >60 mL/min    Comment: (NOTE) Calculated using the CKD-EPI Creatinine Equation (2021)    Anion gap 7 5 - 15    Comment: Performed at [facility], [address] [place], [place] [address]    Treatments: surgery:  08/23/2022 Patient:  Tovi Wagar Schlag Pre-Op Dx: Left Main CAD HTN PVD HLP   Post-op Dx:  same Procedure: CABG X 2.  LIMA LAD, RSVG Diagonal   Endoscopic greater saphenous vein harvest on the right     Surgeon and Role:      * Lightfoot, Eliezer Lofts, MD - Primary    Webb Laws , PA-C - assisting  Discharge Exam: Blood pressure (!) 107/48, pulse 78, temperature 98.8 F (37.1 C), temperature source Oral, resp. rate 16, height 5\' 10"  (1.778 m), weight 94.8 kg, SpO2  98%.   General appearance: alert, cooperative, and no distress Heart: regular rate and rhythm Lungs: clear to auscultation bilaterally Abdomen: benign Extremities: + edema Wound: incis  healing well Discharge Medications:  The patient has been discharged on:   1.Beta Blocker:  Yes [ y  ]                              No   [   ]                              If No, reason:  2.Ace Inhibitor/ARB: Yes [   ]                                     No  [  n  ]                                     If No, reason:labile bp  3.Statin:   Yes [ y  ]                  No  [   ]                  If No, reason:  4.Ecasa:  Yes  [ y  ]                  No   [   ]                  If No, reason:  Patient had ACS upon admission:n  Plavix/P2Y12 inhibitor: Yes [ y  ]                                      No  [   ]     Discharge Instructions     AMB Referral to Advanced Lipid Disorders Clinic   Complete by: As directed    Internal Lipid Clinic Referral Scheduling  Internal lipid clinic referrals are providers within [facility], who wish to refer established patients for routine management (help  in starting PCSK9 inhibitor therapy) or advanced therapies.  Internal MD referral criteria:              1. All patients with LDL>190 mg/dL  2. All patients with Triglycerides >500 mg/dL  3. Patients with suspected or confirmed heterozygous familial hyperlipidemia (HeFH) or homozygous familial hyperlipidemia (HoFH)  4. Patients with family history of suspicious for genetic dyslipidemia desiring genetic testing  5. Patients refractory to standard guideline based therapy  6. Patients with statin intolerance (failed 2 statins, one of which must be a high potency statin)  7. Patients who the provider desires to be seen by MD   Internal PharmD referral criteria:   1. Follow-up patients for medication management  2. Follow-up for compliance monitoring  3. Patients for drug education  4. Patients with statin  intolerance  5. PCSK9 inhibitor education and prior authorization approvals  6. Patients with triglycerides <500 mg/dL  External Lipid Clinic Referral  External lipid clinic referrals are for providers outside of [facility], considered new clinic patients - automatically routed to MD schedule   Amb Referral to Cardiac Rehabilitation   Complete by: As directed    Diagnosis: CABG   CABG X ___: 2   After initial evaluation and assessments completed: Virtual Based Care may be provided alone or in conjunction with Phase 2 Cardiac Rehab based on patient barriers.: Yes   Intensive Cardiac Rehabilitation (ICR) [facility] location only OR Traditional Cardiac Rehabilitation (TCR) *If criteria for ICR are not met will enroll in TCR [facility] only): Yes   Discharge patient   Complete by: As directed    Discharge disposition: 01-Home or Self Care   Discharge patient date: 06/29/2023      Allergies as of 06/29/2023       Reactions   Penicillins Rash   Has patient had a PCN reaction causing immediate rash, facial/tongue/throat swelling, SOB or lightheadedness with hypotension: Yes Has patient had a PCN reaction causing severe rash involving mucus membranes or skin necrosis: No Has patient had a PCN reaction that required hospitalization No Has patient had a PCN reaction occurring within the last 10 years: No If all of the above answers are "NO", then may proceed with Cephalosporin use. Tolerated Cefazolin on 04/16/22   Sulfa Antibiotics Swelling, Other (See Comments)   Facial/lip swelling        Medication List     STOP taking these medications    amLODipine 2.5 MG tablet Commonly known as: NORVASC   irbesartan-hydrochlorothiazide 300-12.5 MG tablet Commonly known as: AVALIDE   nitroGLYCERIN 0.4 MG SL tablet Commonly known as: NITROSTAT       TAKE these medications    aspirin EC 81 MG tablet Take 81 mg by mouth at bedtime.   atorvastatin 80 MG tablet Commonly known as: LIPITOR Take  80 mg by mouth at bedtime.   clopidogrel 75 MG tablet Commonly known as: Plavix Take 1 tablet (75 mg total) by mouth daily.   ezetimibe 10 MG tablet Commonly known as: ZETIA Take 1 tablet (10 mg total) by mouth daily.   Fe Fum-Vit C-Vit B12-FA Caps capsule Commonly known as: TRIGELS-F FORTE Take 1 capsule by mouth daily.   furosemide 40 MG tablet Commonly known as: LASIX Take 1 tablet (40 mg total) by mouth daily.   gabapentin 300 MG capsule Commonly known as: NEURONTIN Take 1 capsule (300 mg total) by mouth 2 (two) times daily.   metoprolol tartrate 25 MG tablet Commonly known as: LOPRESSOR Take 0.5  tablets (12.5 mg total) by mouth 2 (two) times daily.   multivitamin capsule Take 2 capsules by mouth every morning. Gummy   OMEGA 3 PO Take 2 capsules by mouth daily. 454 mg each   oxyCODONE 5 MG immediate release tablet Commonly known as: Oxy IR/ROXICODONE Take 1 tablet (5 mg total) by mouth every 6 (six) hours as needed for up to 7 days for severe pain (pain score 7-10).   potassium chloride SA 20 MEQ tablet Commonly known as: KLOR-CON M Take 1 tablet (20 mEq total) by mouth daily.   PROBIOTIC PO Take 220 mg by mouth daily.   terbinafine 250 MG tablet Commonly known as: LAMISIL Take 250 mg by mouth daily.   vitamin C 250 MG tablet Commonly known as: ASCORBIC ACID Take 500 mg by mouth daily.   zinc sulfate (50mg  elemental zinc) 220 (50 Zn) MG capsule Take 1 capsule (220 mg total) by mouth daily.        Follow-up Information     [facility] Follow up.   Why: HH referral from [facility] office - they will follow up with you post discharge Contact information: [address] [place], [place] [address]  Phone: [phone]                Signed:  Rowe Clack, PA-C  06/29/2023, 12:12 PM

## 2023-06-25 NOTE — Progress Notes (Signed)
NAME:  Clinton Clinton [name], MRN:  [id number], DOB:  07-Sep-1955, LOS: 10 ADMISSION DATE:  06/15/2023 CONSULTATION DATE:  06/24/2023 REFERRING MD:  Cliffton Asters - [facility] CHIEF COMPLAINT:  Post-CABG ventilator management   History of Present Illness:  Clinton Clinton [name] is seen in consultation at the request of Dr. Cliffton Asters ([facility]) for management of mechanical ventilation and hemodynamics post-CABG.  67 year old Clinton [name] who presented to [facility] 11/4 for LHC. PMHx significant for HTN, HLD, CAD, carotid artery occlusion (s/p CEA 08/2015), PVD (followed by [facility], s/p intra-arterial lithotripsy/angioplasty of L popliteal, L tibioperoneal trunk on Plavix, R BKA 2023.)   Underwent CABG x 2 (LIMA to LAD, RSVG diagonal) 11/14 post-Plavix washout. Intraoperative course was uncomplicated with EBL , X-clamp time , pump time .  PCCM consulted for postoperative ventilator management.  Pertinent Medical History:   Past Medical History:  Diagnosis Date   Carotid artery occlusion    Cellulitis    Erectile dysfunction    Hyperlipidemia    Hypertension    Peripheral vascular disease (HCC)    Significant Hospital Events: Including procedures, antibiotic start and stop dates in addition to other pertinent events   11/14 - CABG x 2 (LIMA to LAD, RSVG to diagonal). CCM consulted for MV/hemodynamics.  Interim History / Subjective:  NAEON Wires to be pulled today Getting some Albumin and hopefully weaning off low dose NE  Objective:  Blood pressure (!) 107/54, pulse 69, temperature 99.3 F (37.4 C), temperature source Bladder, resp. rate 14, height 5\' 10"  (1.778 m), weight 93.9 kg, SpO2 93%. CVP:  [0 mmHg-28 mmHg] 7 mmHg CO:  [5.1 L/min-11.3 L/min] 7.6 L/min CI:  [2.5 L/min/m2-5.4 L/min/m2] 3.7 L/min/m2  Vent Mode: PSV;CPAP FiO2 (%):  [40 %-Clinton %] 40 % Set Rate:  [4 bmp-16 bmp] 4 bmp Vt Set:  [580 mL] 580 mL PEEP:  [5 cmH20] 5 cmH20 Pressure Support:  [10 cmH20] 10 cmH20 Plateau Pressure:  [17 cmH20] 17  cmH20   Intake/Output Summary (Last 24 hours) at 06/25/2023 0957 Last data filed at 06/25/2023 0800 Gross per 24 hour  Intake 4506.31 ml  Output 2787 ml  Net 1719.31 ml   Filed Weights   06/22/23 0439 06/24/23 0716 06/25/23 0600  Weight: 86 kg 88.5 kg 93.9 kg   Physical Examination: General: Adult male, resting in bed, in NAD. Neuro: A&O x 3, no deficits. HEENT: [place]/AT. Sclerae anicteric. EOMI. Cardiovascular: RRR, no M/R/G. Chest tube without leak, bloody output Lungs: Respirations even and unlabored.  CTA bilaterally, No W/R/R. Abdomen: BS x 4, soft, NT/ND.  Musculoskeletal: R BKA, no edema.  Skin: Intact, warm, no rashes.  Assessment & Plan:  Clinton Clinton [name] is seen in consultation at the request of Dr. Cliffton Asters ([facility]) for management of mechanical ventilation and hemodynamics post-CABG.  Clinton Clinton [name] who presented to [facility] 11/4 for LHC. PMHx significant for HTN, HLD, CAD, carotid artery occlusion (s/p CEA 08/2015), PVD (followed by [facility], s/p intra-arterial lithotripsy/angioplasty of L popliteal, L tibioperoneal trunk on Plavix, R BKA 2023.)   CAD - s/p CABG x 2 (LIMA to LAD, RSVG to diagonal) 11/14 with Dr. Cliffton Asters. - [facility] managing - Continue Levophed for goal MAP > 65 - Albumin PRN, give today and hopefully can get off NE (only on 3 this AM) - Pacing wires to be removed today then start Pavix - Continue chest tube per [facility] - Continue multimodal pain management (oxycodone/morphine, tramadol, APAP, lido patches) - Continue ASA, Statin  Hx CAS /p CEA, HTN, HLD, PVD s/p R BA -  Continue PTA Statin, Zetia, ASA, Plavix - Hold PTA Amlodipine, Irbesartan-hydrochlorothiazide, Nitroglycerin   Critical care time: 30 min    Rutherford Guys, PA - [facility] For pager details, please see AMION or use Epic chat  After 1900, please call [facility] for cross coverage needs 06/25/2023, 10:10 AM

## 2023-06-25 NOTE — Progress Notes (Addendum)
[facility] DAILY ICU PROGRESS NOTE                   [address]            [place] [address]          [phone]   1 Day Post-Op Procedure(s) (LRB): CORONARY ARTERY BYPASS GRAFTING (CABG) X TWO USING LEFT INTERNAL MAMMARY ARTERY AND RIGHT GREATER SAPHENOUS VEIN HARVESTED ENDOSCOPICALLY (N/A) TRANSESOPHAGEAL ECHOCARDIOGRAM (N/A)  Total Length of Stay:  LOS: 10 days   Subjective: Feels pretty well, not SOB, Pain control is good  Objective: Vital signs in last 24 hours: Temp:  [95.4 F (35.2 C)-101.8 F (38.8 C)] 99.5 F (37.5 C) (11/14 0715) Pulse Rate:  [48-91] 76 (11/14 0715) Cardiac Rhythm: Normal sinus rhythm (11/14 0400) Resp:  [0-29] 19 (11/14 0715) BP: (69-175)/(39-72) 99/49 (11/14 0700) SpO2:  [87 %-100 %] 95 % (11/14 0715) Arterial Line BP: (0-204)/(-32-55) 130/39 (11/14 0715) FiO2 (%):  [40 %-50 %] 40 % (11/13 1539) Weight:  [93.9 kg] 93.9 kg (11/14 0600)  Filed Weights   06/22/23 0439 06/24/23 0716 06/25/23 0600  Weight: 86 kg 88.5 kg 93.9 kg    Weight change:    Hemodynamic parameters for last 24 hours: CVP:  [0 mmHg-28 mmHg] 1 mmHg CO:  [5.1 L/min-11.3 L/min] 8.4 L/min CI:  [2.5 L/min/m2-5.4 L/min/m2] 4.1 L/min/m2  Intake/Output from previous day: 11/13 0701 - 11/14 0700 In: 4752.6 [I.V.:2684.7; Blood:250; IV Piggyback:1817.9] Out: 3037 [Urine:2525; Chest Tube:512]  Intake/Output this shift: No intake/output data recorded.  Current Meds: Scheduled Meds:  acetaminophen  1,000 mg Oral Q6H   Or   acetaminophen (TYLENOL) oral liquid 160 mg/5 mL  1,000 mg Per Tube Q6H   aspirin EC  325 mg Oral Daily   Or   aspirin  324 mg Per Tube Daily   atorvastatin  80 mg Oral Daily   bisacodyl  10 mg Oral Daily   Or   bisacodyl  10 mg Rectal Daily   Chlorhexidine Gluconate Cloth  6 each Topical Daily   docusate sodium  200 mg Oral Daily   ezetimibe  10 mg Oral Daily   metoCLOPramide (REGLAN) injection  10 mg Intravenous Q6H   metoprolol  tartrate  12.5 mg Oral BID   Or   metoprolol tartrate  12.5 mg Per Tube BID   [START ON 06/26/2023] pantoprazole  40 mg Oral Daily   pantoprazole (PROTONIX) IV  40 mg Intravenous QHS   sodium chloride flush  10 mL Intravenous Q12H   sodium chloride flush  3 mL Intravenous Q12H   Continuous Infusions:  sodium chloride 10 mL/hr at 06/24/23 2100   sodium chloride     albumin human Stopped (06/25/23 0524)    ceFAZolin (ANCEF) IV Stopped (06/25/23 0600)   dexmedetomidine (PRECEDEX) IV infusion Stopped (06/24/23 1901)   DOBUTamine     insulin 0.8 Units/hr (06/25/23 0700)   lactated ringers     lactated ringers 20 mL/hr at 06/25/23 0700   niCARDipine     norepinephrine (LEVOPHED) Adult infusion 3 mcg/min (06/25/23 0700)   potassium chloride 50 mL/hr at 06/25/23 0700   PRN Meds:.sodium chloride, albumin human, dextrose, metoprolol tartrate, midazolam, morphine injection, ondansetron (ZOFRAN) IV, oxyCODONE, sodium chloride flush, traMADol  General appearance: alert, cooperative, and no distress Heart: regular rate and rhythm and + rub w/ chest tubes in place Lungs: Clear anteriorly Abdomen: + BS, soft, nontender or distended Extremities: minor LLE edema Wound: incisions CDI  Lab Results: CBC: Recent Labs    06/24/23 1817 06/25/23 0407  WBC 10.8* 10.0  HGB 10.2* 10.2*  HCT 30.3* 30.0*  PLT 216 219   BMET:  Recent Labs    06/24/23 1817 06/25/23 0407  NA 141 132*  K 3.9 3.7  CL 112* 100  CO2 21* 25  GLUCOSE 149* 123*  BUN 11 9  CREATININE 1.19 1.34*  CALCIUM 7.7* 8.1*    CMET: Lab Results  Component Value Date   WBC 10.0 06/25/2023   HGB 10.2 (L) 06/25/2023   HCT 30.0 (L) 06/25/2023   PLT 219 06/25/2023   GLUCOSE 123 (H) 06/25/2023   CHOL 130 06/16/2023   TRIG 52 06/16/2023   HDL 50 06/16/2023   LDLCALC 70 06/16/2023   ALT 23 06/20/2022   AST 20 06/20/2022   NA 132 (L) 06/25/2023   K 3.7 06/25/2023   CL 100 06/25/2023   CREATININE 1.34 (H) 06/25/2023    BUN 9 06/25/2023   CO2 25 06/25/2023   INR 1.3 (H) 06/24/2023   HGBA1C 5.3 06/23/2023      PT/INR:  Recent Labs    06/24/23 1244  LABPROT 16.8*  INR 1.3*   Radiology: ECHO INTRAOPERATIVE TEE  Result Date: 06/24/2023  *INTRAOPERATIVE TRANSESOPHAGEAL REPORT *  Patient Name:   Clinton [name] Date of Exam: 06/24/2023 Medical Rec #:  [id number]         Height:       70.0 in Accession #:    [id number]        Weight:       195.0 lb Date of Birth:  08-Jun-1956        BSA:          2.06 m Patient Age:    67 years          BP:           123/65 mmHg Patient Gender: M                 HR:           45 bpm. Exam Location:  Anesthesiology Transesophogeal exam was perform intraoperatively during surgical procedure. Patient was closely monitored under general anesthesia during the entirety of examination. Indications:     CAD Performing Phys: [id number] Ashan Cueva O Onetha Gaffey Complications: No known complications during this procedure. POST-OP IMPRESSIONS _ Left Ventricle: The left ventricle is unchanged from pre-bypass. _ Right Ventricle: The right ventricle appears unchanged from pre-bypass. _ Aorta: The aorta appears unchanged from pre-bypass. _ Left Atrial Appendage: The left atrial appendage appears unchanged from pre-bypass. _ Aortic Valve: The aortic valve appears unchanged from pre-bypass. _ Mitral Valve: The mitral valve appears unchanged from pre-bypass. _ Tricuspid Valve: The tricuspid valve appears unchanged from pre-bypass. _ Pulmonic Valve: The pulmonic valve appears unchanged from pre-bypass. _ Interatrial Septum: The interatrial septum appears unchanged from pre-bypass. _ Pericardium: The pericardium appears unchanged from pre-bypass. _ Comments: - S/P CABG X 2. No new or worsening wall motion or valvular issues. PRE-OP FINDINGS  Left Ventricle: The left ventricle has normal systolic function, with an ejection fraction of 60-65%. The cavity size was normal. No evidence of left ventricular regional wall  motion abnormalities. Left ventricular diastolic parameters were normal. Right Ventricle: The right ventricle has normal systolic function. The cavity was normal. There is no increase in right ventricular wall thickness. Left Atrium: Left atrial size was normal in size. No left atrial/left atrial appendage thrombus was detected. Left atrial  appendage velocity is normal at greater than 40 cm/s. Right Atrium: Right atrial size was normal in size. Interatrial Septum: No atrial level shunt detected by color flow Doppler. The interatrial septum appears to be lipomatous. There is no evidence of a patent foramen ovale. Pericardium: There is no evidence of pericardial effusion. There is no pleural effusion. Mitral Valve: The mitral valve is normal in structure. Mitral valve regurgitation is trivial by color flow Doppler. There is no evidence of mitral valve vegetation. There is No evidence of mitral stenosis. Tricuspid Valve: The tricuspid valve was normal in structure. Tricuspid valve regurgitation is trivial by color flow Doppler. No evidence of tricuspid stenosis is present. There is no evidence of tricuspid valve vegetation. Aortic Valve: The aortic valve is tricuspid Aortic valve regurgitation was not visualized by color flow Doppler. There is no stenosis of the aortic valve, with a calculated valve area of 3.47 cm. There is no evidence of aortic valve vegetation. Pulmonic Valve: The pulmonic valve was normal in structure. Pulmonic valve regurgitation is trivial by color flow Doppler. Aorta: The aortic root and ascending aorta are normal in size and structure. Venous: The inferior vena cava is normal in size with less than 50% respiratory variability, suggesting right atrial pressure of 8 mmHg. Shunts: There is no evidence of an atrial septal defect. +--------------+--------++ LEFT VENTRICLE          +----------------+---------++ +--------------+--------++  Diastology                PLAX 2D                  +----------------+---------++ +--------------+--------++  LV e' lateral:  7.31 cm/s LVIDd:        5.20 cm   +----------------+---------++ +--------------+--------++  LV E/e' lateral:8.3       LVIDs:        3.70 cm   +----------------+---------++ +--------------+--------++  LV e' medial:   6.28 cm/s LV PW:        0.90 cm   +----------------+---------++ +--------------+--------++  LV E/e' medial: 9.7       LV IVS:       1.00 cm   +----------------+---------++ +--------------+--------++ LVOT diam:    2.60 cm   +----------------------+-------++ +--------------+--------++  2D Longitudinal Strain        LV SV:        71 ml     +----------------------+-------++ +--------------+--------++  2D Strain GLS (A2C):  -13.7 % LV SV Index:  33.87     +----------------------+-------++ +--------------+--------++  2D Strain GLS (A3C):  -14.4 % LVOT Area:    5.31 cm  +----------------------+-------++ +--------------+--------++  2D Strain GLS (A4C):  -10.6 %                         +----------------------+-------++ +--------------+--------++  2D Strain GLS Avg:    -12.9 %                             +----------------------+-------++ +---------------+---------+---------+ RIGHT VENTRICLE                   +---------------+---------+---------+ RV S prime:    6.79 cm/s14.4 cm/s +---------------+---------+---------+ TAPSE (M-mode):1.6 cm   2.37 cm   +---------------+---------+---------+ +------------------+-----------++ AORTIC VALVE                  +------------------+-----------++ AV Area (Vmax):   2.95 cm    +------------------+-----------++ AV Area (Vmean):  2.89 cm    +------------------+-----------++ AV Area (VTI):    3.47 cm    +------------------+-----------++ AV Vmax:          106.00 cm/s +------------------+-----------++ AV Vmean:         71.000 cm/s +------------------+-----------++ AV VTI:           0.257 m      +------------------+-----------++ AV Peak Grad:     4.5 mmHg    +------------------+-----------++ AV Mean Grad:     2.0 mmHg    +------------------+-----------++ LVOT Vmax:        58.80 cm/s  +------------------+-----------++ LVOT Vmean:       38.700 cm/s +------------------+-----------++ LVOT VTI:         0.168 m     +------------------+-----------++ LVOT/AV VTI ratio:0.65        +------------------+-----------++  +--------------+-------++ AORTA                 +--------------+-------++ Ao Sinus diam:3.80 cm +--------------+-------++ Ao STJ diam:  2.4 cm  +--------------+-------++ Ao Asc diam:  2.90 cm +--------------+-------++ +--------------+----------++ MITRAL VALVE              +--------------+-------+ +--------------+----------++  SHUNTS                MV Area (PHT):4.10 cm    +--------------+-------+ +--------------+----------++  Systemic VTI: 0.17 m  MV Peak grad: 1.4 mmHg    +--------------+-------+ +--------------+----------++  Systemic Diam:2.60 cm MV Mean grad: 0.0 mmHg    +--------------+-------+ +--------------+----------++ MV Vmax:      0.60 m/s   +--------------+----------++ MV Vmean:     19.9 cm/s  +--------------+----------++ MV VTI:       0.19 m     +--------------+----------++ MV PHT:       53.65 msec +--------------+----------++ MV Decel Time:185 msec   +--------------+----------++ +--------------+----------++ MV E velocity:61.00 cm/s +--------------+----------++ MV A velocity:42.00 cm/s +--------------+----------++ MV E/A ratio: 1.45       +--------------+----------++  Hester Mates Electronically signed by Hester Mates Signature Date/Time: 06/24/2023/4:38:21 PM    Final    DG Chest Port 1 View  Result Date: 06/24/2023 CLINICAL DATA:  Postop EXAM: PORTABLE CHEST 1 VIEW COMPARISON:  Chest x-ray 06/22/2023 FINDINGS: There is a new endotracheal tube 3 cm above the carina. Right-sided  central venous catheter tip projects over the SVC. Swan-Ganz catheter projects over the left main pulmonary artery. Enteric tube extends below the diaphragm. There are new sternotomy wires and left-sided chest tube. The cardiac silhouette is enlarged, unchanged. There central pulmonary vascular congestion. There are linear atelectatic changes in the left lung. Small nodular densities in the lateral right lung persists. No pneumothorax visualized. IMPRESSION: 1. New postoperative changes with support apparatus as above. 2. Stable cardiomegaly with central pulmonary vascular congestion. 3. Linear atelectatic changes in the left lung. Electronically Signed   By: Darliss Cheney M.D.   On: 06/24/2023 16:14   EP STUDY  Result Date: 06/24/2023 See surgical note for result.    Assessment/Plan: S/P Procedure(s) (LRB): CORONARY ARTERY BYPASS GRAFTING (CABG) X TWO USING LEFT INTERNAL MAMMARY ARTERY AND RIGHT GREATER SAPHENOUS VEIN HARVESTED ENDOSCOPICALLY (N/A) TRANSESOPHAGEAL ECHOCARDIOGRAM (N/A) POD#1  1 Tmax 99.5, excellent hemodynamics , on levo, CVP low at 1 if accurate, sinus rhythm, BP well controlled 2 O2 sats good on RA 3 weight 7 KG>preop, good UOP but did slow overnight, will need some diuresis Creat did bump a little to 1.34 4 Minor hyponatremia- monitor 5 K+ and MG++ ok 6 expected ABLA- not at transfusion  threshold, monitor clinically, should improve with equilibration 7 BS well controlled, routine protocols, not a diabetic 8 CT 512 ml recorded, keep for now  9 CXR - some vascular congestion 10 EKG RBBB, not new- 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

## 2023-06-26 ENCOUNTER — Inpatient Hospital Stay (HOSPITAL_COMMUNITY): Payer: 59

## 2023-06-26 DIAGNOSIS — I25119 Atherosclerotic heart disease of native coronary artery with unspecified angina pectoris: Secondary | ICD-10-CM | POA: Diagnosis not present

## 2023-06-26 LAB — GLUCOSE, CAPILLARY
Glucose-Capillary: 105 mg/dL — ABNORMAL HIGH (ref 70–99)
Glucose-Capillary: 107 mg/dL — ABNORMAL HIGH (ref 70–99)
Glucose-Capillary: 107 mg/dL — ABNORMAL HIGH (ref 70–99)
Glucose-Capillary: 113 mg/dL — ABNORMAL HIGH (ref 70–99)
Glucose-Capillary: 114 mg/dL — ABNORMAL HIGH (ref 70–99)
Glucose-Capillary: 135 mg/dL — ABNORMAL HIGH (ref 70–99)
Glucose-Capillary: 138 mg/dL — ABNORMAL HIGH (ref 70–99)
Glucose-Capillary: 95 mg/dL (ref 70–99)

## 2023-06-26 LAB — BASIC METABOLIC PANEL
Anion gap: 7 (ref 5–15)
Anion gap: 7 (ref 5–15)
BUN: 18 mg/dL (ref 8–23)
BUN: 19 mg/dL (ref 8–23)
CO2: 25 mmol/L (ref 22–32)
CO2: 25 mmol/L (ref 22–32)
Calcium: 8.3 mg/dL — ABNORMAL LOW (ref 8.9–10.3)
Calcium: 8.2 mg/dL — ABNORMAL LOW (ref 8.9–10.3)
Chloride: 100 mmol/L (ref 98–111)
Chloride: 99 mmol/L (ref 98–111)
Creatinine, Ser: 1.83 mg/dL — ABNORMAL HIGH (ref 0.61–1.24)
Creatinine, Ser: 1.54 mg/dL — ABNORMAL HIGH (ref 0.61–1.24)
GFR, Estimated: 40 mL/min — ABNORMAL LOW (ref >60)
GFR, Estimated: 49 mL/min — ABNORMAL LOW (ref >60)
Glucose, Bld: 99 mg/dL (ref 70–99)
Glucose, Bld: 132 mg/dL — ABNORMAL HIGH (ref 70–99)
Potassium: 4.4 mmol/L (ref 3.5–5.1)
Potassium: 4.2 mmol/L (ref 3.5–5.1)
Sodium: 132 mmol/L — ABNORMAL LOW (ref 135–145)
Sodium: 131 mmol/L — ABNORMAL LOW (ref 135–145)

## 2023-06-26 LAB — CBC
HCT: 27 % — ABNORMAL LOW (ref 39.0–52.0)
Hemoglobin: 8.8 g/dL — ABNORMAL LOW (ref 13.0–17.0)
MCH: 32.4 pg (ref 26.0–34.0)
MCHC: 32.6 g/dL (ref 30.0–36.0)
MCV: 99.3 fL (ref 80.0–100.0)
Platelets: 161 10*3/uL (ref 150–400)
RBC: 2.72 MIL/uL — ABNORMAL LOW (ref 4.22–5.81)
RDW: 13.3 % (ref 11.5–15.5)
WBC: 7.9 10*3/uL (ref 4.0–10.5)
nRBC: 0 % (ref 0.0–0.2)

## 2023-06-26 MED ORDER — FUROSEMIDE 10 MG/ML IJ SOLN
40.0000 mg | Freq: Once | INTRAMUSCULAR | Status: AC
  Administered 2023-06-26: 40 mg via INTRAVENOUS
  Filled 2023-06-26: qty 4

## 2023-06-26 MED ORDER — FE FUM-VIT C-VIT B12-FA 460-60-0.01-1 MG PO CAPS
1.0000 | ORAL_CAPSULE | Freq: Every day | ORAL | Status: DC
  Administered 2023-06-26 – 2023-06-29 (×4): 1 via ORAL
  Filled 2023-06-26 (×4): qty 1

## 2023-06-26 MED ORDER — ALBUMIN HUMAN 25 % IV SOLN
12.5000 g | Freq: Once | INTRAVENOUS | Status: AC
  Administered 2023-06-26: 12.5 g via INTRAVENOUS
  Filled 2023-06-26: qty 50

## 2023-06-26 MED ORDER — ~~LOC~~ CARDIAC SURGERY, PATIENT & FAMILY EDUCATION
Freq: Once | Status: AC
Start: 2023-06-26 — End: 2023-06-26

## 2023-06-26 NOTE — Progress Notes (Signed)
NAME:  LELON REHRER, MRN:  [id number], DOB:  09-Jun-1956, LOS: 11 ADMISSION DATE:  06/15/2023 CONSULTATION DATE:  06/24/2023 REFERRING MD:  Cliffton Asters - [facility] CHIEF COMPLAINT:  Post-CABG ventilator management   History of Present Illness:  Mr. Chorley is seen in consultation at the request of Dr. Cliffton Asters (T[facility]) for management of mechanical ventilation and hemodynamics post-CABG.  67 year old man who presented to [facility] 11/4 for LHC. PMHx significant for HTN, HLD, CAD, carotid artery occlusion (s/p CEA 08/2015), PVD (followed by [facility], s/p intra-arterial lithotripsy/angioplasty of L popliteal, L tibioperoneal trunk on Plavix, R BKA 2023.)   Underwent CABG x 2 (LIMA to LAD, RSVG diagonal) 11/14 post-Plavix washout. Intraoperative course was uncomplicated with EBL , X-clamp time , pump time .  PCCM consulted for postoperative ventilator management.  Pertinent Medical History:   Past Medical History:  Diagnosis Date   Carotid artery occlusion    Cellulitis    Erectile dysfunction    Hyperlipidemia    Hypertension    Peripheral vascular disease (HCC)    Significant Hospital Events: Including procedures, antibiotic start and stop dates in addition to other pertinent events   11/14 - CABG x 2 (LIMA to LAD, RSVG to diagonal). CCM consulted for MV/hemodynamics. 11/14 attempted 250LR bolus and Albumin to try kickstart UOP. Unfortunately no response  Interim History / Subjective:  No meaningful response to fluids and albumin yesterday in terms of UOP. Total UOP last 24 hours only 210.  Feels quite well despite some LE edema.   Off levophed since mid morning yesterday.   Objective:  Blood pressure (!) 118/52, pulse 78, temperature 98.2 F (36.8 C), temperature source Oral, resp. rate 12, height 5\' 10"  (1.778 m), weight 95 kg, SpO2 96%. CVP:  [7 mmHg-10 mmHg] 9 mmHg CO:  [7.8 L/min-8.9 L/min] 7.8 L/min CI:  [3.8 L/min/m2-4.3 L/min/m2] 3.8 L/min/m2      Intake/Output  Summary (Last 24 hours) at 06/26/2023 0855 Last data filed at 06/26/2023 0600 Gross per 24 hour  Intake 1116.49 ml  Output 310 ml  Net 806.49 ml   Filed Weights   06/24/23 0716 06/25/23 0600 06/26/23 0500  Weight: 88.5 kg 93.9 kg 95 kg   Physical Examination: General: Adult male, resting in bed, in NAD. Neuro: A&O x 3, no deficits. HEENT: [place]/AT. Sclerae anicteric. EOMI. Cardiovascular: RRR, no M/R/G. Chest tube without leak, bloody output Lungs: Respirations even and unlabored.  CTA bilaterally, No W/R/R. Abdomen: BS x 4, soft, NT/ND.  Musculoskeletal: R BKA with some edema at stump. Skin: Intact, warm, no rashes.  Assessment & Plan:  Mr. Timbers is seen in consultation at the request of Dr. Cliffton Asters (TCTS) for management of mechanical ventilation and hemodynamics post-CABG.  67 year old man who presented to [facility] 11/4 for LHC. PMHx significant for HTN, HLD, CAD, carotid artery occlusion (s/p CEA 08/2015), PVD (followed by [facility], s/p intra-arterial lithotripsy/angioplasty of L popliteal, L tibioperoneal trunk on Plavix, R BKA 2023.)   CAD - s/p CABG x 2 (LIMA to LAD, RSVG to diagonal) 11/14 with Dr. Cliffton Asters. - TCTS managing - Continue chest tube per TCTS, to be pulled today - Continue multimodal pain management (oxycodone/morphine, tramadol, APAP, lido patches, gabapentin) - Continue secondary prevention with ASA, Statin, Plavix  Hx CAS /p CEA, HTN, HLD, PVD s/p R BA - Continue PTA Statin, Zetia, ASA, Plavix - Hold PTA Amlodipine, Irbesartan-hydrochlorothiazide, Nitroglycerin  AKI with oliguria. - Discussed with Dr. Cliffton Asters. Will try Albumin 25% x 1 this AM and assess response. - If  no response with UOP over next few hours, then plan for Furosemide this afternoon.  Anemia. - Transfuse for Hgb < 7.    Rutherford Guys, PA - C [facility] For pager details, please see AMION or use Epic chat  After 1900, please call [facility] for cross coverage  needs 06/26/2023, 8:55 AM

## 2023-06-26 NOTE — Progress Notes (Signed)
PCCM Brief Note  Seems to have responded to 25% Albumin with at least 625cc UOP since 0700 (had one urinary void in bathroom that was not quantified). Also received 40mg  Furosemide around 1420 He has had slight improvement in AKI with SCr drop from 1.83 to 1.54.  Per RN, Dr. Cliffton Asters has mentioned that pt will likely transfer to floor this evening.   Rutherford Guys, PA - C [facility] For pager details, please see AMION or use Epic chat  After 1900, please call [facility] for cross coverage needs 06/26/2023, 4:03 PM

## 2023-06-26 NOTE — TOC Initial Note (Signed)
Transition of Care [facility]) - Initial/Assessment Note    Patient Details  Name: Clinton [name] MRN: [id number] Date of Birth: 07/05/1956  [facility]) CM/SW Contact:    Elliot Cousin, RN Phone Number: [phone] 06/26/2023, 3:44 PM  Clinical Narrative:   CM spoke to pt and wife at bedside. Offered choice for HH, medicare.gov list given and placed on chart. Agreeable to [facility]. Pt has RW, cane, wheelchair, and leg prosthetics.  Will continue to follow for dc needs.                   Expected Discharge Plan: Home w Home Health Services Barriers to Discharge: Continued Medical Work up   Patient Goals and CMS Choice Patient states their goals for this hospitalization and ongoing recovery are:: wants to get better CMS Medicare.gov Compare Post Acute Care list provided to:: Patient Represenative (must comment) Choice offered to / list presented to : Spouse      Expected Discharge Plan and Services   Discharge Planning Services: CM Consult Post Acute Care Choice: Home Health Living arrangements for the past 2 months: Single Family Home                           HH Arranged: RN HH Agency: [facility] Date HH Agency Contacted: 06/26/23 Time HH Agency Contacted: 1531 Representative spoke with at [facility] Agency: Clarise Cruz  Prior Living Arrangements/Services Living arrangements for the past 2 months: Single Family Home Lives with:: Spouse Patient language and need for interpreter reviewed:: Yes Do you feel safe going back to the place where you live?: Yes      Need for Family Participation in Patient Care: No (Comment) Care giver support system in place?: Yes (comment)   Criminal Activity/Legal Involvement Pertinent to Current Situation/Hospitalization: No - Comment as needed  Activities of Daily Living   ADL Screening (condition at time of admission) Independently performs ADLs?: Yes (appropriate for developmental  age) Is the patient deaf or have difficulty hearing?: No Does the patient have difficulty seeing, even when wearing glasses/contacts?: No Does the patient have difficulty concentrating, remembering, or making decisions?: No  Permission Sought/Granted Permission sought to share information with : Case Manager, Family Supports, PCP Permission granted to share information with : Yes, Verbal Permission Granted  Share Information with NAME: Clinton [name]  Permission granted to share info w AGENCY: Home Health  Permission granted to share info w Relationship: wife  Permission granted to share info w Contact Information: [phone]  Emotional Assessment Appearance:: Appears stated age Attitude/Demeanor/Rapport: Engaged Affect (typically observed): Accepting Orientation: : Oriented to Self, Oriented to Place, Oriented to  Time, Oriented to Situation   Psych Involvement: No (comment)  Admission diagnosis:  Coronary artery disease involving native coronary artery of native heart with angina pectoris (HCC) [I25.119] S/P CABG x 2 [Z95.1] Patient Active Problem List   Diagnosis Date Noted   S/P CABG x 2 06/24/2023   Acute postoperative respiratory insufficiency 06/24/2023   Coronary artery disease involving native coronary artery of native heart with angina pectoris (HCC) 06/15/2023   Elevated coronary artery calcium score 08/06/2022   Dehiscence of amputation stump of right lower extremity (HCC) 06/20/2022   Acute blood loss anemia 05/19/2022   Post-op pain 05/19/2022   Acute renal failure (HCC) 05/19/2022   S/P BKA (below knee amputation), right (HCC) 05/14/2022   Below-knee amputation of right lower extremity (HCC) 05/09/2022  Gangrene of right foot (HCC)    PAD (peripheral artery disease) (HCC) 04/17/2022   Acute osteomyelitis of metatarsal bone of right foot (HCC)    Atherosclerosis of native arteries of the extremities with ulceration (HCC) 04/15/2022   Peripheral arterial  disease (HCC) 03/25/2022   Obesity 03/21/2022   Prediabetes 03/21/2022   Erectile dysfunction 07/26/2021   Family history of ischemic heart disease (IHD) 07/26/2021   Pure hypercholesterolemia 07/26/2021   History of adenomatous polyp of colon 02/03/2019   Internal hemorrhoids 02/03/2019   Carotid artery stenosis, asymptomatic 09/05/2015   Cellulitis    Hypertension    Hyperlipidemia    Occlusion and stenosis of carotid artery without mention of cerebral infarction 01/01/2012   PCP:  Darrow Bussing, MD Pharmacy:   [facility] [phone] - [place], [place] - [address] AT [address] OF [facility] [address] [place] [address] Phone: [phone] Fax: [phone]  [facility] [address] [place] [address] Phone: [phone] Fax: [phone]     Social Determinants of Health (SDOH) Social History: SDOH Screenings   Food Insecurity: No Food Insecurity (06/17/2023)  Housing: Low Risk  (06/17/2023)  Transportation Needs: No Transportation Needs (06/17/2023)  Utilities: Not At Risk (06/17/2023)  Depression (PHQ2-9): Low Risk  (03/20/2023)  Tobacco Use: Medium Risk (06/24/2023)   SDOH Interventions:     Readmission Risk Interventions    04/22/2022   10:45 AM  Readmission Risk Prevention Plan  Post Dischage Appt Complete  Medication Screening Complete  Transportation Screening Complete

## 2023-06-26 NOTE — Progress Notes (Addendum)
2 Days Post-Op Procedure(s) (LRB): CORONARY ARTERY BYPASS GRAFTING (CABG) X TWO USING LEFT INTERNAL MAMMARY ARTERY AND RIGHT GREATER SAPHENOUS VEIN HARVESTED ENDOSCOPICALLY (N/A) TRANSESOPHAGEAL ECHOCARDIOGRAM (N/A) Subjective: Some minor discomfort but mostly feels pretty well  Objective: Vital signs in last 24 hours: Temp:  [97.7 F (36.5 C)-99.5 F (37.5 C)] 97.7 F (36.5 C) (11/15 0000) Pulse Rate:  [61-93] 68 (11/15 0700) Cardiac Rhythm: Normal sinus rhythm (11/15 0400) Resp:  [5-29] 10 (11/15 0700) BP: (76-133)/(46-96) 129/53 (11/15 0700) SpO2:  [91 %-99 %] 99 % (11/15 0700) Arterial Line BP: (107-156)/(40-46) 137/41 (11/14 1100) Weight:  [95 kg] 95 kg (11/15 0500)  Hemodynamic parameters for last 24 hours: CVP:  [7 mmHg-10 mmHg] 9 mmHg CO:  [7.6 L/min-8.9 L/min] 7.8 L/min CI:  [3.7 L/min/m2-4.3 L/min/m2] 3.8 L/min/m2  Intake/Output from previous day: 11/14 0701 - 11/15 0700 In: 1170.2 [I.V.:211; IV Piggyback:959.2] Out: 310 [Urine:210; Chest Tube:100] Intake/Output this shift: No intake/output data recorded.  General appearance: alert, cooperative, and no distress Heart: regular rate and rhythm Lungs: clear anteriorly Abdomen: benign Extremities: minor edema Wound: dressings CDI  Lab Results: Recent Labs    06/25/23 1723 06/26/23 0248  WBC 9.7 7.9  HGB 9.8* 8.8*  HCT 29.7* 27.0*  PLT 189 161   BMET:  Recent Labs    06/25/23 1723 06/26/23 0248  NA 132* 132*  K 4.4 4.4  CL 99 100  CO2 24 25  GLUCOSE 124* 99  BUN 14 18  CREATININE 1.64* 1.83*  CALCIUM 8.3* 8.3*    PT/INR:  Recent Labs    06/24/23 1244  LABPROT 16.8*  INR 1.3*   ABG    Component Value Date/Time   PHART 7.302 (L) 06/24/2023 1731   HCO3 19.5 (L) 06/24/2023 1731   TCO2 21 (L) 06/24/2023 1731   ACIDBASEDEF 6.0 (H) 06/24/2023 1731   O2SAT 98 06/24/2023 1731   CBG (last 3)  Recent Labs    06/25/23 2358 06/26/23 0253 06/26/23 0647  GLUCAP 107* 107* 105*     Meds Scheduled Meds:  acetaminophen  1,000 mg Oral Q6H   Or   acetaminophen (TYLENOL) oral liquid 160 mg/5 mL  1,000 mg Per Tube Q6H   aspirin EC  81 mg Oral Daily   atorvastatin  80 mg Oral Daily   bisacodyl  10 mg Oral Daily   Or   bisacodyl  10 mg Rectal Daily   Chlorhexidine Gluconate Cloth  6 each Topical Daily   clopidogrel  75 mg Oral Daily   docusate sodium  200 mg Oral Daily   enoxaparin (LOVENOX) injection  40 mg Subcutaneous QHS   ezetimibe  10 mg Oral Daily   gabapentin  300 mg Oral BID   insulin aspart  0-24 Units Subcutaneous Q4H   metoprolol tartrate  12.5 mg Oral BID   Or   metoprolol tartrate  12.5 mg Per Tube BID   pantoprazole  40 mg Oral Daily   sodium chloride flush  10 mL Intravenous Q12H   sodium chloride flush  3 mL Intravenous Q12H   Continuous Infusions:  albumin human Stopped (06/25/23 0524)   insulin Stopped (06/25/23 1248)   norepinephrine (LEVOPHED) Adult infusion Stopped (06/25/23 1101)   PRN Meds:.albumin human, dextrose, metoprolol tartrate, morphine injection, ondansetron (ZOFRAN) IV, oxyCODONE, sodium chloride flush, traMADol  Xrays DG Chest Port 1 View  Result Date: 06/25/2023 CLINICAL DATA:  Status post CABG. EXAM: PORTABLE CHEST 1 VIEW COMPARISON:  Chest x-ray from yesterday. FINDINGS: Interval removal of the  endotracheal and enteric tubes. Unchanged right internal jugular central venous catheter, mediastinal drain, and left chest tube. Stable cardiomediastinal silhouette. Similar pulmonary vascular congestion. 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 Slightly increased mild right basilar atelectasis. Unchanged medial left basilar atelectasis. No pneumothorax. No acute osseous abnormality. IMPRESSION: 1. Interval extubation. Slightly increased mild right basilar atelectasis. Unchanged left basilar atelectasis. 2. Similar pulmonary vascular congestion. Electronically Signed   By: Obie Dredge M.D.   On: 06/25/2023 10:07   ECHO INTRAOPERATIVE TEE  Result Date: 06/24/2023  *INTRAOPERATIVE TRANSESOPHAGEAL REPORT *  Patient Name:   Clinton [name] Date of Exam: 06/24/2023 Medical Rec #:  [id number]         Height:       70.0 in Accession #:    [id number]        Weight:       195.0 lb Date of Birth:  July 16, 1956        BSA:          2.06 m Patient Age:    67 years          BP:           123/65 mmHg Patient Gender: M                 HR:           45 bpm. Exam Location:  Anesthesiology Transesophogeal exam was perform intraoperatively during surgical procedure. Patient was closely monitored under general anesthesia during the entirety of examination. Indications:     CAD Performing Phys: [id number] [name] Complications: No known complications during this procedure. POST-OP IMPRESSIONS _ Left Ventricle: The left ventricle is unchanged from pre-bypass. _ Right Ventricle: The right ventricle appears unchanged from pre-bypass. _ Aorta: The aorta appears unchanged from pre-bypass. _ Left Atrial Appendage: The left atrial appendage appears unchanged from pre-bypass. _ Aortic Valve: The aortic valve appears unchanged from pre-bypass. _ Mitral Valve: The mitral valve appears unchanged from pre-bypass. _ Tricuspid Valve: The tricuspid valve appears unchanged from pre-bypass. _ Pulmonic Valve: The pulmonic valve appears unchanged from pre-bypass. _ Interatrial Septum: The interatrial septum appears unchanged from pre-bypass. _ Pericardium: The pericardium appears unchanged from pre-bypass. _ Comments: - S/P CABG X 2. No new or worsening wall motion or valvular issues. PRE-OP FINDINGS  Left Ventricle: The left ventricle has normal systolic function, with an ejection fraction of 60-65%. The cavity size was normal. No evidence of left ventricular regional wall motion abnormalities. Left ventricular diastolic parameters were normal. Right Ventricle: The right ventricle has normal systolic function.  The cavity was normal. There is no increase in right ventricular wall thickness. Left Atrium: Left atrial size was normal in size. No left atrial/left atrial appendage thrombus was detected. Left atrial appendage velocity is normal at greater than 40 cm/s. Right Atrium: Right atrial size was normal in size. Interatrial Septum: No atrial level shunt detected by color flow Doppler. The interatrial septum appears to be lipomatous. There is no evidence of a patent foramen ovale. Pericardium: There is no evidence of pericardial effusion. There is no pleural effusion. Mitral Valve: The mitral valve is normal in structure. Mitral valve regurgitation is trivial by color flow Doppler. There is no evidence of mitral valve vegetation. There is No evidence of mitral stenosis. Tricuspid Valve: The tricuspid valve was normal in structure. Tricuspid valve regurgitation is trivial by color flow Doppler. No evidence of tricuspid stenosis is present. There is no evidence  of tricuspid valve vegetation. Aortic Valve: The aortic valve is tricuspid Aortic valve regurgitation was not visualized by color flow Doppler. There is no stenosis of the aortic valve, with a calculated valve area of 3.47 cm. There is no evidence of aortic valve vegetation. Pulmonic Valve: The pulmonic valve was normal in structure. Pulmonic valve regurgitation is trivial by color flow Doppler. Aorta: The aortic root and ascending aorta are normal in size and structure. Venous: The inferior vena cava is normal in size with less than 50% respiratory variability, suggesting right atrial pressure of 8 mmHg. Shunts: There is no evidence of an atrial septal defect. +--------------+--------++ LEFT VENTRICLE          +----------------+---------++ +--------------+--------++  Diastology                PLAX 2D                 +----------------+---------++ +--------------+--------++  LV e' lateral:  7.31 cm/s LVIDd:        5.20 cm    +----------------+---------++ +--------------+--------++  LV E/e' lateral:8.3       LVIDs:        3.70 cm   +----------------+---------++ +--------------+--------++  LV e' medial:   6.28 cm/s LV PW:        0.90 cm   +----------------+---------++ +--------------+--------++  LV E/e' medial: 9.7       LV IVS:       1.00 cm   +----------------+---------++ +--------------+--------++ LVOT diam:    2.60 cm   +----------------------+-------++ +--------------+--------++  2D Longitudinal Strain        LV SV:        71 ml     +----------------------+-------++ +--------------+--------++  2D Strain GLS (A2C):  -13.7 % LV SV Index:  33.87     +----------------------+-------++ +--------------+--------++  2D Strain GLS (A3C):  -14.4 % LVOT Area:    5.31 cm  +----------------------+-------++ +--------------+--------++  2D Strain GLS (A4C):  -10.6 %                         +----------------------+-------++ +--------------+--------++  2D Strain GLS Avg:    -12.9 %                             +----------------------+-------++ +---------------+---------+---------+ RIGHT VENTRICLE                   +---------------+---------+---------+ RV S prime:    6.79 cm/s14.4 cm/s +---------------+---------+---------+ TAPSE (M-mode):1.6 cm   2.37 cm   +---------------+---------+---------+ +------------------+-----------++ AORTIC VALVE                  +------------------+-----------++ AV Area (Vmax):   2.95 cm    +------------------+-----------++ AV Area (Vmean):  2.89 cm    +------------------+-----------++ AV Area (VTI):    3.47 cm    +------------------+-----------++ AV Vmax:          106.00 cm/s +------------------+-----------++ AV Vmean:         71.000 cm/s +------------------+-----------++ AV VTI:           0.257 m     +------------------+-----------++ AV Peak Grad:     4.5 mmHg    +------------------+-----------++ AV Mean Grad:      2.0 mmHg    +------------------+-----------++ LVOT Vmax:        58.80 cm/s  +------------------+-----------++ LVOT Vmean:       38.700 cm/s +------------------+-----------++ LVOT VTI:  0.168 m     +------------------+-----------++ LVOT/AV VTI ratio:0.65        +------------------+-----------++  +--------------+-------++ AORTA                 +--------------+-------++ Ao Sinus diam:3.80 cm +--------------+-------++ Ao STJ diam:  2.4 cm  +--------------+-------++ Ao Asc diam:  2.90 cm +--------------+-------++ +--------------+----------++ MITRAL VALVE              +--------------+-------+ +--------------+----------++  SHUNTS                MV Area (PHT):4.10 cm    +--------------+-------+ +--------------+----------++  Systemic VTI: 0.17 m  MV Peak grad: 1.4 mmHg    +--------------+-------+ +--------------+----------++  Systemic Diam:2.60 cm MV Mean grad: 0.0 mmHg    +--------------+-------+ +--------------+----------++ MV Vmax:      0.60 m/s   +--------------+----------++ MV Vmean:     19.9 cm/s  +--------------+----------++ MV VTI:       0.19 m     +--------------+----------++ MV PHT:       53.65 msec +--------------+----------++ MV Decel Time:185 msec   +--------------+----------++ +--------------+----------++ MV E velocity:61.00 cm/s +--------------+----------++ MV A velocity:42.00 cm/s +--------------+----------++ MV E/A ratio: 1.45       +--------------+----------++  Hester Mates Electronically signed by Hester Mates Signature Date/Time: 06/24/2023/4:38:21 PM    Final    DG Chest Port 1 View  Result Date: 06/24/2023 CLINICAL DATA:  Postop EXAM: PORTABLE CHEST 1 VIEW COMPARISON:  Chest x-ray 06/22/2023 FINDINGS: There is a new endotracheal tube 3 cm above the carina. Right-sided central venous catheter tip projects over the SVC. Swan-Ganz catheter projects over the left main pulmonary artery. Enteric  tube extends below the diaphragm. There are new sternotomy wires and left-sided chest tube. The cardiac silhouette is enlarged, unchanged. There central pulmonary vascular congestion. There are linear atelectatic changes in the left lung. Small nodular densities in the lateral right lung persists. No pneumothorax visualized. IMPRESSION: 1. New postoperative changes with support apparatus as above. 2. Stable cardiomegaly with central pulmonary vascular congestion. 3. Linear atelectatic changes in the left lung. Electronically Signed   By: Darliss Cheney M.D.   On: 06/24/2023 16:14   EP STUDY  Result Date: 06/24/2023 See surgical note for result.   Assessment/Plan: S/P Procedure(s) (LRB): CORONARY ARTERY BYPASS GRAFTING (CABG) X TWO USING LEFT INTERNAL MAMMARY ARTERY AND RIGHT GREATER SAPHENOUS VEIN HARVESTED ENDOSCOPICALLY (N/A) TRANSESOPHAGEAL ECHOCARDIOGRAM (N/A) POD#2  1 Tmax 99.5, sBP 70's-130's, mostly WNL, levo is off, sinus rhythm, PVC's, most recent CVP- 9 2 O2 sats good on 1 liter [place] 3 poor UOP ,,  weight approx 7 kg>preop 4 CT 100 ml/24h- d/c today 5 BS well controlled for non diabetic- routine protocols 6 creat has risen to 1.83- will likely need diuresis at this time 7 mild hyponatremia is stable 8 H/H down a little further, not at transfusion threshold, will need to follow with volume equilibration- will start Iron combo supplement 9 CXR - some basilar ATX/ASD 10 plavix started 11 lovenox for DVT PPx 12 routine pulm hygiene and rehab was unable to get prosthetic leg on yesterday d/t swelling     LOS: 11 days    Rowe Clack 06/26/2023  Agree with above Will try 25% albumin.  If no response, will diurese today  Luretha Eberly Keane Scrape

## 2023-06-26 NOTE — Progress Notes (Signed)
Patient ID: Clinton [name], male   DOB: [birth year], 67 y.o.   MRN: [id number] TCTS Evening Rounds:  Hemodynamically stable in sinus rhythm. Sats 98% RA UO good. Had BM today.  Awaiting bed on 4E.

## 2023-06-27 LAB — BASIC METABOLIC PANEL
Anion gap: 7 (ref 5–15)
BUN: 19 mg/dL (ref 8–23)
CO2: 25 mmol/L (ref 22–32)
Calcium: 8.3 mg/dL — ABNORMAL LOW (ref 8.9–10.3)
Chloride: 103 mmol/L (ref 98–111)
Creatinine, Ser: 1.51 mg/dL — ABNORMAL HIGH (ref 0.61–1.24)
GFR, Estimated: 50 mL/min — ABNORMAL LOW (ref >60)
Glucose, Bld: 96 mg/dL (ref 70–99)
Potassium: 3.9 mmol/L (ref 3.5–5.1)
Sodium: 135 mmol/L (ref 135–145)

## 2023-06-27 LAB — GLUCOSE, CAPILLARY
Glucose-Capillary: 99 mg/dL (ref 70–99)
Glucose-Capillary: 100 mg/dL — ABNORMAL HIGH (ref 70–99)
Glucose-Capillary: 135 mg/dL — ABNORMAL HIGH (ref 70–99)

## 2023-06-27 LAB — CBC
HCT: 25.4 % — ABNORMAL LOW (ref 39.0–52.0)
Hemoglobin: 8.5 g/dL — ABNORMAL LOW (ref 13.0–17.0)
MCH: 33.1 pg (ref 26.0–34.0)
MCHC: 33.5 g/dL (ref 30.0–36.0)
MCV: 98.8 fL (ref 80.0–100.0)
Platelets: 154 10*3/uL (ref 150–400)
RBC: 2.57 MIL/uL — ABNORMAL LOW (ref 4.22–5.81)
RDW: 13.2 % (ref 11.5–15.5)
WBC: 6.6 10*3/uL (ref 4.0–10.5)
nRBC: 0 % (ref 0.0–0.2)

## 2023-06-27 MED ORDER — TRAMADOL HCL 50 MG PO TABS
50.0000 mg | ORAL_TABLET | ORAL | Status: DC | PRN
  Administered 2023-06-27 – 2023-06-28 (×2): 50 mg via ORAL
  Filled 2023-06-27 (×2): qty 1

## 2023-06-27 MED ORDER — POTASSIUM CHLORIDE CRYS ER 20 MEQ PO TBCR
20.0000 meq | EXTENDED_RELEASE_TABLET | Freq: Two times a day (BID) | ORAL | Status: AC
  Administered 2023-06-27 – 2023-06-28 (×3): 20 meq via ORAL
  Filled 2023-06-27 (×4): qty 1

## 2023-06-27 MED ORDER — ORAL CARE MOUTH RINSE: 15.0000 mL | OROMUCOSAL | Status: DC | PRN

## 2023-06-27 MED ORDER — TORSEMIDE 20 MG PO TABS
40.0000 mg | ORAL_TABLET | Freq: Every day | ORAL | Status: AC
  Administered 2023-06-27 – 2023-06-28 (×2): 40 mg via ORAL
  Filled 2023-06-27 (×2): qty 2

## 2023-06-27 NOTE — Progress Notes (Addendum)
CARDIAC REHAB PHASE I     Pt oob to bathroom. Spoke with ICU RN, pt has yet to ambulate in hallway due to leg swelling and prosthesis fit. Swelling and prosthesis fit improved today and will attempt walk in hall after bathroom visit. Will continue to follow for mobility needs and [facility] post op education.  [id number] Woodroe Chen, RN BSN 06/27/2023 11:01 AM

## 2023-06-27 NOTE — Progress Notes (Signed)
3 Days Post-Op Procedure(s) (LRB): CORONARY ARTERY BYPASS GRAFTING (CABG) X TWO USING LEFT INTERNAL MAMMARY ARTERY AND RIGHT GREATER SAPHENOUS VEIN HARVESTED ENDOSCOPICALLY (N/A) TRANSESOPHAGEAL ECHOCARDIOGRAM (N/A) Subjective: No complaints. Had BM  Ambulating well.  Objective: Vital signs in last 24 hours: Temp:  [98 F (36.7 C)-99 F (37.2 C)] 98 F (36.7 C) (11/16 0804) Pulse Rate:  [65-100] 78 (11/16 1000) Cardiac Rhythm: Normal sinus rhythm (11/16 0800) Resp:  [10-28] 24 (11/16 1000) BP: (105-163)/(48-98) 127/61 (11/16 1000) SpO2:  [94 %-100 %] 97 % (11/16 1000) Weight:  [96.6 kg] 96.6 kg (11/16 0400)  Hemodynamic parameters for last 24 hours:    Intake/Output from previous day: 11/15 0701 - 11/16 0700 In: 290.5 [P.O.:240; IV Piggyback:50.5] Out: 820 [Urine:800; Chest Tube:20] Intake/Output this shift: Total I/O In: 240 [P.O.:240] Out: -   General appearance: alert and cooperative Neurologic: intact Heart: regular rate and rhythm Lungs: clear to auscultation bilaterally Extremities: edema mild Wound: incision looks good.  Lab Results: Recent Labs    06/26/23 0248 06/27/23 0356  WBC 7.9 6.6  HGB 8.8* 8.5*  HCT 27.0* 25.4*  PLT 161 154   BMET:  Recent Labs    06/26/23 1150 06/27/23 0356  NA 131* 135  K 4.2 3.9  CL 99 103  CO2 25 25  GLUCOSE 132* 96  BUN 19 19  CREATININE 1.54* 1.51*  CALCIUM 8.2* 8.3*    PT/INR:  Recent Labs    06/24/23 1244  LABPROT 16.8*  INR 1.3*   ABG    Component Value Date/Time   PHART 7.302 (L) 06/24/2023 1731   HCO3 19.5 (L) 06/24/2023 1731   TCO2 21 (L) 06/24/2023 1731   ACIDBASEDEF 6.0 (H) 06/24/2023 1731   O2SAT 98 06/24/2023 1731   CBG (last 3)  Recent Labs    06/26/23 2343 06/27/23 0355 06/27/23 0806  GLUCAP 95 99 100*    Assessment/Plan: S/P Procedure(s) (LRB): CORONARY ARTERY BYPASS GRAFTING (CABG) X TWO USING LEFT INTERNAL MAMMARY ARTERY AND RIGHT GREATER SAPHENOUS VEIN HARVESTED  ENDOSCOPICALLY (N/A) TRANSESOPHAGEAL ECHOCARDIOGRAM (N/A)  POD 3  Hemodynamically stable in sinus rhythm. Continue Lopressor.  Wt is recorded at 18 lbs over preop but I doubt that is accurate. Not sure if baseline was done with or without his prosthetic leg. Will continue diuretic for a couple more days.  Glucose under good control with no hx of DM and normal Hgb A1c. Stop SSI.  Creat stable at 1.51. 0.9 preop. Expect that to improve over the next few days.   Continue IS, ambulation.   Awaiting bed on 4E.   LOS: 12 days    Clinton [name] 06/27/2023

## 2023-06-27 NOTE — Progress Notes (Signed)
Patient ID: Clinton [name], male   DOB: May 06, 1956, 67 y.o.   MRN: [id number]  TCTS Evening Rounds:  Hemodynamically stable in sinus rhythm.  Ambulating.  Waiting on 4E bed.

## 2023-06-28 LAB — BASIC METABOLIC PANEL
Anion gap: 7 (ref 5–15)
BUN: 18 mg/dL (ref 8–23)
CO2: 25 mmol/L (ref 22–32)
Calcium: 8.2 mg/dL — ABNORMAL LOW (ref 8.9–10.3)
Chloride: 104 mmol/L (ref 98–111)
Creatinine, Ser: 1.35 mg/dL — ABNORMAL HIGH (ref 0.61–1.24)
GFR, Estimated: 57 mL/min — ABNORMAL LOW (ref >60)
Glucose, Bld: 99 mg/dL (ref 70–99)
Potassium: 4 mmol/L (ref 3.5–5.1)
Sodium: 136 mmol/L (ref 135–145)

## 2023-06-28 MED ORDER — FUROSEMIDE 40 MG PO TABS
40.0000 mg | ORAL_TABLET | Freq: Every day | ORAL | Status: DC
  Administered 2023-06-28 – 2023-06-29 (×2): 40 mg via ORAL
  Filled 2023-06-28 (×2): qty 1

## 2023-06-28 MED ORDER — POTASSIUM CHLORIDE CRYS ER 20 MEQ PO TBCR
20.0000 meq | EXTENDED_RELEASE_TABLET | Freq: Two times a day (BID) | ORAL | Status: DC
  Administered 2023-06-28 – 2023-06-29 (×3): 20 meq via ORAL
  Filled 2023-06-28 (×2): qty 1

## 2023-06-28 NOTE — Progress Notes (Signed)
4 Days Post-Op Procedure(s) (LRB): CORONARY ARTERY BYPASS GRAFTING (CABG) X TWO USING LEFT INTERNAL MAMMARY ARTERY AND RIGHT GREATER SAPHENOUS VEIN HARVESTED ENDOSCOPICALLY (N/A) TRANSESOPHAGEAL ECHOCARDIOGRAM (N/A) Subjective:  No complaints. Ambulating well.   Objective: Vital signs in last 24 hours: Temp:  [97.9 F (36.6 C)-98.8 F (37.1 C)] 98.6 F (37 C) (11/17 0800) Pulse Rate:  [66-96] 79 (11/17 0800) Cardiac Rhythm: Normal sinus rhythm (11/17 0800) Resp:  [14-26] 26 (11/16 2000) BP: (64-146)/(43-79) 146/77 (11/17 0800) SpO2:  [88 %-100 %] 91 % (11/17 0800) Weight:  [95.4 kg] 95.4 kg (11/17 0555)  Hemodynamic parameters for last 24 hours:    Intake/Output from previous day: 11/16 0701 - 11/17 0700 In: 360 [P.O.:360] Out: -  Intake/Output this shift: No intake/output data recorded.  General appearance: alert and cooperative Neurologic: intact Heart: regular rate and rhythm Lungs: clear to auscultation bilaterally Extremities: edema mild in left leg Wound: incisions ok  Lab Results: Recent Labs    06/26/23 0248 06/27/23 0356  WBC 7.9 6.6  HGB 8.8* 8.5*  HCT 27.0* 25.4*  PLT 161 154   BMET:  Recent Labs    06/27/23 0356 06/28/23 0311  NA 135 136  K 3.9 4.0  CL 103 104  CO2 25 25  GLUCOSE 96 99  BUN 19 18  CREATININE 1.51* 1.35*  CALCIUM 8.3* 8.2*    PT/INR: No results for input(s): "LABPROT", "INR" in the last 72 hours. ABG    Component Value Date/Time   PHART 7.302 (L) 06/24/2023 1731   HCO3 19.5 (L) 06/24/2023 1731   TCO2 21 (L) 06/24/2023 1731   ACIDBASEDEF 6.0 (H) 06/24/2023 1731   O2SAT 98 06/24/2023 1731   CBG (last 3)  Recent Labs    06/27/23 0355 06/27/23 0806 06/27/23 1200  GLUCAP 99 100* 135*    Assessment/Plan: S/P Procedure(s) (LRB): CORONARY ARTERY BYPASS GRAFTING (CABG) X TWO USING LEFT INTERNAL MAMMARY ARTERY AND RIGHT GREATER SAPHENOUS VEIN HARVESTED ENDOSCOPICALLY (N/A) TRANSESOPHAGEAL ECHOCARDIOGRAM (N/A)  POD  4  Hemodynamically stable in sinus rhythm. Continue Lopressor.  Creat coming back down to baseline.  He is probably 10 lbs over preop. He says baseline is 200 lbs with his prosthesis on. Will continue diuresis.  Awaiting bed on 4E for the past several days. He can probably go home tomorrow.   LOS: 13 days    Alleen Borne 06/28/2023

## 2023-06-29 MED ORDER — FE FUM-VIT C-VIT B12-FA 460-60-0.01-1 MG PO CAPS: 1.0000 | ORAL_CAPSULE | Freq: Every day | ORAL | 0 refills | Status: AC

## 2023-06-29 MED ORDER — OXYCODONE HCL 5 MG PO TABS: 5.0000 mg | ORAL_TABLET | Freq: Four times a day (QID) | ORAL | 0 refills | Status: AC | PRN

## 2023-06-29 MED ORDER — FUROSEMIDE 40 MG PO TABS: 40.0000 mg | ORAL_TABLET | Freq: Every day | ORAL | 0 refills | Status: DC

## 2023-06-29 MED ORDER — METOPROLOL TARTRATE 25 MG PO TABS: 12.5000 mg | ORAL_TABLET | Freq: Two times a day (BID) | ORAL | 1 refills | Status: DC

## 2023-06-29 MED ORDER — POTASSIUM CHLORIDE CRYS ER 20 MEQ PO TBCR: 20.0000 meq | EXTENDED_RELEASE_TABLET | Freq: Every day | ORAL | 0 refills | Status: DC

## 2023-06-29 MED ORDER — EZETIMIBE 10 MG PO TABS: 10.0000 mg | ORAL_TABLET | Freq: Every day | ORAL | 1 refills | Status: DC

## 2023-06-29 MED ORDER — GABAPENTIN 300 MG PO CAPS: 300.0000 mg | ORAL_CAPSULE | Freq: Two times a day (BID) | ORAL | 0 refills | Status: DC

## 2023-06-29 MED FILL — Lidocaine HCl Local Preservative Free (PF) Inj 2%: INTRAMUSCULAR | Qty: 14 | Status: AC

## 2023-06-29 MED FILL — Potassium Chloride Inj 2 mEq/ML: INTRAVENOUS | Qty: 40 | Status: AC

## 2023-06-29 MED FILL — Heparin Sodium (Porcine) Inj 1000 Unit/ML: Qty: 1000 | Status: AC

## 2023-06-29 NOTE — Progress Notes (Addendum)
Pt had questions this morning about one of his medications, his pharmacy not able to get til tomorrow evening, and pt was waiting for surgeon to come back to see him. Dr Cliffton Asters has been in now to see pt.   Discharge instructions reviewed with pt. Discussed incision care, when to call the MD or 911, when to follow up at appointments and to expect a call from Dr Cliffton Asters this Wednesday for a follow up call. Questions answered by this RN or by Dr Cliffton Asters when he rounded on pt.  Pt encouraged to write down any other questions that may come up after he gets home and have ready to ask Dr Cliffton Asters when he calls him on Wednesday.   Copy of instructions given to pt.  Pt informed his scripts were sent to his pharmacy for pick up. Pt has called his wife and she will be coming to pick him up.  Pt working on getting dressed at this time.  Pt will go down to the discharge lounge unless his wife arrives before he is dressed and ready to go down.   Pt to be d/c'd via wheelchair with belongings, and will be escorted by staff or hospital volunteer.          At 1115--pt's nurse at bedside changing dressing to incision vein harvesting site (pt wears a prothesis that comes up over the incision, pt will keep a dry gauze over the incision to keep his prothesis sleeve from sticking to it and making it moist--as pt states he sweats under the sleeve. ' Pt is not dressed yet and wife is on her way and insist on coming up to pt's room pt states.  Pt's nurse aware as she is at bedside.     Alante Tolan,RN SWOT

## 2023-06-29 NOTE — TOC Transition Note (Signed)
Transition of Care (TOC) - CM/SW Discharge Note Donn Pierini RN, BSN Transitions of Care Unit 4E- RN Case Manager See Treatment Team for direct phone #   Patient Details  Name: Clinton [name] MRN: [id number] Date of Birth: [birth year]  Transition of Care [facility]) CM/SW Contact:  Darrold Span, RN Phone Number: 06/29/2023, 11:14 AM   Clinical Narrative:    Pt stable for transition home today, Wife to transport. Meds have been sent to community pharmacy.   TOC has been notified by [facility] liaison that TCTS office made referral to them for [facility] needs. Liaison has spoken with pt and he is agreeable to have [facility] f/u with him for [facility] visits post discharge.  Liaison to follow and schedule start of care for [facility] follow up as per TCTS office protocol.   No DME needs noted- pt has needed DME at home.   No further TOC needs noted.    Final next level of care: Home w Home Health Services Barriers to Discharge: Barriers Resolved   Patient Goals and CMS Choice CMS Medicare.gov Compare Post Acute Care list provided to:: Patient Represenative (must comment) Choice offered to / list presented to : Spouse  Discharge Placement                 Home w/ [facility]        Discharge Plan and Services Additional resources added to the After Visit Summary for     Discharge Planning Services: CM Consult Post Acute Care Choice: Home Health          DME Arranged: N/A DME Agency: NA       HH Arranged: RN HH Agency: [facility] Date HH Agency Contacted: 06/26/23 Time HH Agency Contacted: 1531 Representative spoke with at [facility] Agency: Clarise Cruz  Social Determinants of Health (SDOH) Interventions SDOH Screenings   Food Insecurity: No Food Insecurity (06/17/2023)  Housing: Low Risk  (06/17/2023)  Transportation Needs: No Transportation Needs (06/17/2023)  Utilities: Not At Risk (06/17/2023)  Depression (PHQ2-9): Low Risk  (03/20/2023)  Tobacco Use: Medium Risk  (06/24/2023)     Readmission Risk Interventions    06/29/2023   11:13 AM 04/22/2022   10:45 AM  Readmission Risk Prevention Plan  Post Dischage Appt  Complete  Medication Screening  Complete  Transportation Screening Complete Complete  Home Care Screening Complete   Medication Review (RN CM) Complete

## 2023-06-29 NOTE — Progress Notes (Addendum)
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 USING LEFT INTERNAL MAMMARY ARTERY AND RIGHT GREATER SAPHENOUS VEIN HARVESTED ENDOSCOPICALLY (N/A) TRANSESOPHAGEAL ECHOCARDIOGRAM (N/A) Subjective: Feels pretty well overall  Objective: Vital signs in last 24 hours: Temp:  [98.1 F (36.7 C)-99 F (37.2 C)] 98.3 F (36.8 C) (11/18 0404) Pulse Rate:  [70-80] 76 (11/18 0404) Cardiac Rhythm: Normal sinus rhythm;Bundle branch block (11/17 1910) Resp:  [14-19] 16 (11/18 0404) BP: (109-146)/(51-77) 137/63 (11/18 0404) SpO2:  [91 %-100 %] 98 % (11/18 0404) Weight:  [94.8 kg] 94.8 kg (11/18 0404)  Hemodynamic parameters for last 24 hours:    Intake/Output from previous day: 11/17 0701 - 11/18 0700 In: 120 [P.O.:120] Out: -  Intake/Output this shift: No intake/output data recorded.  General appearance: alert, cooperative, and no distress Heart: regular rate and rhythm Lungs: clear to auscultation bilaterally Abdomen: benign Extremities: + edema Wound: incis  healing well  Lab Results: Recent Labs    06/27/23 0356  WBC 6.6  HGB 8.5*  HCT 25.4*  PLT 154   BMET:  Recent Labs    06/27/23 0356 06/28/23 0311  NA 135 136  K 3.9 4.0  CL 103 104  CO2 25 25  GLUCOSE 96 99  BUN 19 18  CREATININE 1.51* 1.35*  CALCIUM 8.3* 8.2*    PT/INR: No results for input(s): "LABPROT", "INR" in the last 72 hours. ABG    Component Value Date/Time   PHART 7.302 (L) 06/24/2023 1731   HCO3 19.5 (L) 06/24/2023 1731   TCO2 21 (L) 06/24/2023 1731   ACIDBASEDEF 6.0 (H) 06/24/2023 1731   O2SAT 98 06/24/2023 1731   CBG (last 3)  Recent Labs    06/27/23 0355 06/27/23 0806 06/27/23 1200  GLUCAP 99 100* 135*    Meds Scheduled Meds:  acetaminophen  1,000 mg Oral Q6H   Or   acetaminophen (TYLENOL) oral liquid 160 mg/5 mL  1,000 mg Per Tube Q6H   aspirin EC  81 mg Oral Daily   atorvastatin  80 mg Oral Daily   Chlorhexidine Gluconate Cloth  6 each Topical  Daily   clopidogrel  75 mg Oral Daily   enoxaparin (LOVENOX) injection  40 mg Subcutaneous QHS   ezetimibe  10 mg Oral Daily   Fe Fum-Vit C-Vit B12-FA  1 capsule Oral Daily   furosemide  40 mg Oral Daily   gabapentin  300 mg Oral BID   metoprolol tartrate  12.5 mg Oral BID   Or   metoprolol tartrate  12.5 mg Per Tube BID   pantoprazole  40 mg Oral Daily   potassium chloride  20 mEq Oral BID   sodium chloride flush  10 mL Intravenous Q12H   sodium chloride flush  3 mL Intravenous Q12H   Continuous Infusions: PRN Meds:.metoprolol tartrate, ondansetron (ZOFRAN) IV, mouth rinse, oxyCODONE, sodium chloride flush, traMADol  Xrays No results found.  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

## 2023-06-29 NOTE — Progress Notes (Signed)
CARDIAC REHAB PHASE I     Post [facility] education including site care, restrictions, heart healthy diet, sternal precautions, IS use at home, home needs at discharge, exercise guidelines and CRP2 reviewed. All questions and concerns addressed. Will refer to [facility] for CRP2.   [id number] Woodroe Chen, RN BSN 06/29/2023 9:37 AM

## 2023-06-30 ENCOUNTER — Ambulatory Visit: Payer: 59 | Admitting: Physician Assistant

## 2023-07-01 ENCOUNTER — Ambulatory Visit (INDEPENDENT_AMBULATORY_CARE_PROVIDER_SITE_OTHER): Payer: Self-pay | Admitting: Thoracic Surgery (Cardiothoracic Vascular Surgery)

## 2023-07-01 DIAGNOSIS — Z951 Presence of aortocoronary bypass graft: Secondary | ICD-10-CM

## 2023-07-01 NOTE — Progress Notes (Signed)
     [address]       [place] [address]             [phone]       Patient: Home Provider: Office Consent for Telemedicine visit obtained.  Today's visit was completed via a real-time telehealth (see specific modality noted below). The patient/authorized person provided oral consent at the time of the visit to engage in a telemedicine encounter with the present provider at [facility]. The patient/authorized person was informed of the potential benefits, limitations, and risks of telemedicine. The patient/authorized person expressed understanding that the laws that protect confidentiality also apply to telemedicine. The patient/authorized person acknowledged understanding that telemedicine does not provide emergency services and that he or she would need to call 911 or proceed to the nearest hospital for help if such a need arose.   Total time spent in the clinical discussion 10 minutes.  Telehealth Modality: Phone visit (audio only)  I had a telephone visit with  Clinton [name] who is s/p CABG.  Overall doing well.  Pain is minimal.  Ambulating well. Vitals have been stable.  [name] will see [place] back in 1 month with a chest x-ray for cardiac rehab clearance.  Shomari Matusik Keane Scrape

## 2023-07-02 ENCOUNTER — Telehealth (HOSPITAL_COMMUNITY): Payer: Self-pay

## 2023-07-02 MED FILL — Heparin Sodium (Porcine) Inj 1000 Unit/ML: INTRAMUSCULAR | Qty: 10 | Status: AC

## 2023-07-02 MED FILL — Electrolyte-R (PH 7.4) Solution: INTRAVENOUS | Qty: 3000 | Status: AC

## 2023-07-02 MED FILL — Mannitol IV Soln 20%: INTRAVENOUS | Qty: 500 | Status: AC

## 2023-07-02 MED FILL — Sodium Bicarbonate IV Soln 8.4%: INTRAVENOUS | Qty: 50 | Status: AC

## 2023-07-02 MED FILL — Sodium Chloride IV Soln 0.9%: INTRAVENOUS | Qty: 2000 | Status: AC

## 2023-07-02 MED FILL — Calcium Chloride Inj 10%: INTRAVENOUS | Qty: 10 | Status: AC

## 2023-07-02 NOTE — Telephone Encounter (Signed)
Attempted to call patient in regards to Cardiac Rehab - LM on VM 

## 2023-07-13 ENCOUNTER — Telehealth: Payer: Self-pay | Admitting: Cardiovascular Disease

## 2023-07-13 NOTE — Telephone Encounter (Signed)
Patient's wife called to check if patient needs to keep both of his appointments on 12/5 with Azalee Course and Dr Allyson Sabal on 12/27. Patient recently had open heart surgery for a double bypass. He is scheduled with a follow up with Dr Cliffton Asters who preformed the surgery on 12/18. She states she didn't know if she still needed to come into the office since they are following up with Dr Cliffton Asters.

## 2023-07-13 NOTE — Telephone Encounter (Signed)
Wife [name]) wants to know if patient needs to keep f/u appointment on 12/5.

## 2023-07-13 NOTE — Telephone Encounter (Signed)
Called patient left Dr.Berry's advice on personal voice mail.Advised to keep appointment with Azalee Course PA 12/5 at 8:25 am.3 month appointment scheduled with Dr.Berry 10/16/23 at 9:00 am.

## 2023-07-16 ENCOUNTER — Ambulatory Visit: Payer: 59 | Attending: Physician Assistant | Admitting: Physician Assistant

## 2023-07-16 ENCOUNTER — Encounter: Payer: Self-pay | Admitting: Physician Assistant

## 2023-07-16 VITALS — BP 134/64 | HR 70 | Ht 70.0 in | Wt 201.8 lb

## 2023-07-16 DIAGNOSIS — I10 Essential (primary) hypertension: Secondary | ICD-10-CM

## 2023-07-16 DIAGNOSIS — I25810 Atherosclerosis of coronary artery bypass graft(s) without angina pectoris: Secondary | ICD-10-CM

## 2023-07-16 DIAGNOSIS — E785 Hyperlipidemia, unspecified: Secondary | ICD-10-CM | POA: Diagnosis not present

## 2023-07-16 DIAGNOSIS — I739 Peripheral vascular disease, unspecified: Secondary | ICD-10-CM

## 2023-07-16 NOTE — Patient Instructions (Signed)
Medication Instructions:  No Changes *If you need a refill on your cardiac medications before your next appointment, please call your pharmacy*   Lab Work: No labs If you have labs (blood work) drawn today and your tests are completely normal, you will receive your results only by: MyChart Message (if you have MyChart) OR A paper copy in the mail If you have any lab test that is abnormal or we need to change your treatment, we will call you to review the results.   Testing/Procedures: No Testing   Follow-Up: At [facility] you and your health needs are our priority.  As part of our continuing mission to provide you with exceptional heart care, we have created designated Provider Care Teams.  These Care Teams include your primary Cardiologist (physician) and Advanced Practice Providers (APPs -  Physician Assistants and Nurse Practitioners) who all work together to provide you with the care you need, when you need it.  We recommend signing up for the patient portal called "MyChart".  Sign up information is provided on this After Visit Summary.  MyChart is used to connect with patients for Virtual Visits (Telemedicine).  Patients are able to view lab/test results, encounter notes, upcoming appointments, etc.  Non-urgent messages can be sent to your provider as well.   To learn more about what you can do with MyChart, go to [email or web address].    Your next appointment:   Keep Scheduled Appointment  Provider:   Nanetta Batty, MD     Other Instructions  Monitor Blood Pressure Daily.For Systolic Blood Pressure above 161 Call Our Office.

## 2023-07-16 NOTE — Progress Notes (Signed)
Cardiology Office Note:  .   Date:  07/16/2023  ID:  Clinton [name], DOB 1955/12/25, MRN [id number] PCP: Darrow Bussing, MD  [facility] Providers Cardiologist:  Nanetta Batty, MD     History of Present Illness: Clinton Kitchen   Clinton [name] is a 67 y.o. male with PMH of HTN, HLD, PAD, RBBB, CAD s/p CABG and carotid artery disease s/p R CEA on 09/05/2015.  He has significant family history of heart issue with his father died of MI at the age of 38, mother at age 14, younger brother had CABG.  He himself has never had a heart attack or stroke.  He has been followed by [name] for critical limb ischemia and had endovascular therapy for his right popliteal artery, left popliteal artery and the peroneal artery.  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  He had a calcium scoring test on 07/01/2022 which showed calcium score of 1756 majority of which is in the LAD and RCA territory, he was completely asymptomatic.  Subsequent Myoview obtained on 07/18/2022 was normal.  Echocardiogram obtained on 07/09/2022 shows EF 60 to 65%, mild LVH of the basal septal segment, grade 1 DD.  Patient underwent lower extremity angiography on 10/28/2022 which showed widely patent left common femoral and profundofemoral artery, heavily calcified but patent left SFA.  Below-knee popliteal artery is heavily calcified and diffusely diseased was 80 to 90% stenosis in the distal popliteal artery at the level of the anterior tibial artery.  This was treated with with drug-coated balloon angioplasty.  He was last seen by [name] on 06/01/2023 at which time he was doing well.   I last saw the patient on 06/09/2023 at which time he complained of intermittent chest tightness.  Chest tightness primarily happens with physical exertion.  EKG showed minimal ST depression in V3-V5.  His case was reviewed with Dr. Jacques Navy, we recommended proceed directly to cardiac catheterization.   Cardiac catheterization performed on 11//2024 demonstrated 95% left main, 50% D1, LVEDP 36 mmHg.  Preoperative carotid Doppler obtained in 06/16/2023 showed 1 to 39% bilateral ICA disease, greater than 50% ECA disease on the left.  He ultimately underwent CABG x 2 with LIMA-LAD and SVG to diagonal by Dr. Cliffton Asters on 06/24/2023.  Postop, pressure support was weaned without difficulty.  He had expected acute blood loss anemia.  He had AKI with creatinine increased to 1.84 on postop day #2.  He was given albumin prior to initiating diuresis.  Renal function improved after diuresis.  He was able to maintain sinus rhythm in the postop period.  Since he was discharged, patient has been seen by Dr. Cliffton Asters via virtual visit on 07/01/2023 at which time he was doing well.  Patient presents today for follow-up.  He is sternotomy site is very well-healed.  He denies significant chest pain or shortness of breath.  He has completed a 14-day course of Lasix and potassium and is no longer on either medication.  He has no significant lower extremity edema.  He has lung is largely clear with minimally diminished breath sounds in the left base.  Overall, he has been doing well from the cardiac perspective.  His LDL in the hospital was 70, ideally with CABG, his LDL should be less than 55.  I encouraged diet and exercise.  He is currently on aspirin and Plavix.  EKG showed no acute changes with underlying right bundle branch block and left anterior fascicular block.  Blood pressure remained elevated today.  On manual recheck by myself blood pressure was 134/64.  I am hesitant to increase the beta-blocker in light of right bundle branch block and the left anterior fascicular block.  I asked him to monitor his blood pressure at home.  If systolic blood pressures persistently over 140 mmHg or higher, I plan to restart his previous 2.5 mg of amlodipine.  He can follow-up with Dr. Gery Pray in March as previously scheduled.  ROS:   He  denies chest pain, palpitations, dyspnea, pnd, orthopnea, n, v, dizziness, syncope, edema, weight gain, or early satiety. All other systems reviewed and are otherwise negative except as noted above.    Studies Reviewed: .        Cardiac Studies & Procedures   CARDIAC CATHETERIZATION  CARDIAC CATHETERIZATION 06/15/2023  Narrative   Ost LM to Mid LM lesion is 95% stenosed.   1st Diag lesion is 50% stenosed.  1.  95% left main lesion. 2.  LVEDP of 36 mmHg.  Recommendation: Will admit and obtain cardiothoracic surgical evaluation.  Will administer Lasix 20 mg IV x 1 given elevated LVEDP.  Findings Coronary Findings Diagnostic  Dominance: Right  Left Main Ost LM to Mid LM lesion is 95% stenosed.  Left Anterior Descending The vessel exhibits minimal luminal irregularities.  First Diagonal Branch 1st Diag lesion is 50% stenosed.  Right Coronary Artery The vessel exhibits minimal luminal irregularities.  Intervention  No interventions have been documented.   STRESS TESTS  MYOCARDIAL PERFUSION IMAGING 07/18/2022  Narrative   The study is normal. The study is low risk.   No ST deviation was noted. Arrhythmias during recovery: frequent PVCs.   LV perfusion is normal. There is no evidence of ischemia. There is no evidence of infarction. Significant extracardiac uptake is present, however wall motion appears normal and likely does not impact interpretation of perfusion.   Left ventricular function is normal. Calculated ejection fraction performed but not reported, visually appears 60%. End diastolic cavity size is normal.   Prior study available for comparison from 08/15/2015. No changes compared to prior study.   ECHOCARDIOGRAM  ECHOCARDIOGRAM COMPLETE 06/16/2023  Narrative ECHOCARDIOGRAM REPORT    Patient Name:   Clinton [name] Date of Exam: 06/16/2023 Medical Rec #:  [id number]         Height:       70.0 in Accession #:    [id number]        Weight:       196.0 lb Date  of Birth:  Dec 24, 1955        BSA:          2.069 m Patient Age:    67 years          BP:           136/60 mmHg Patient Gender: M                 HR:           81 bpm. Exam Location:  Inpatient  Procedure: 2D Echo, Color Doppler, Cardiac Doppler and Intracardiac Opacification Agent  Indications:    Pre-op cardiovascular exam  History:        Patient has prior history of Echocardiogram examinations, most recent 07/09/2022. CAD, PAD and Carotid Disease; Risk Factors:Hypertension and Dyslipidemia.  Sonographer:    Milbert Coulter Referring Phys: [id number] HARRELL O LIGHTFOOT  IMPRESSIONS   1. Left ventricular ejection fraction, by estimation, is 55 to 60%. The left ventricle has  normal function. The left ventricle has no regional wall motion abnormalities. Left ventricular diastolic parameters are consistent with Grade I diastolic dysfunction (impaired relaxation). 2. Right ventricular systolic function is normal. The right ventricular size is normal. There is normal pulmonary artery systolic pressure. The estimated right ventricular systolic pressure is 34.6 mmHg. 3. The mitral valve is grossly normal. Trivial mitral valve regurgitation. No evidence of mitral stenosis. 4. The aortic valve is tricuspid. Aortic valve regurgitation is not visualized. No aortic stenosis is present. 5. The inferior vena cava is normal in size with greater than 50% respiratory variability, suggesting right atrial pressure of 3 mmHg.  Comparison(s): No significant change from prior study.  FINDINGS Left Ventricle: Left ventricular ejection fraction, by estimation, is 55 to 60%. The left ventricle has normal function. The left ventricle has no regional wall motion abnormalities. Definity contrast agent was given IV to delineate the left ventricular endocardial borders. The left ventricular internal cavity size was normal in size. There is no left ventricular hypertrophy. Left ventricular diastolic parameters are  consistent with Grade I diastolic dysfunction (impaired relaxation).  Right Ventricle: The right ventricular size is normal. No increase in right ventricular wall thickness. Right ventricular systolic function is normal. There is normal pulmonary artery systolic pressure. The tricuspid regurgitant velocity is 2.81 m/s, and with an assumed right atrial pressure of 3 mmHg, the estimated right ventricular systolic pressure is 34.6 mmHg.  Left Atrium: Left atrial size was normal in size.  Right Atrium: Right atrial size was normal in size.  Pericardium: Trivial pericardial effusion is present. Presence of epicardial fat layer.  Mitral Valve: The mitral valve is grossly normal. Trivial mitral valve regurgitation. No evidence of mitral valve stenosis.  Tricuspid Valve: The tricuspid valve is grossly normal. Tricuspid valve regurgitation is trivial. No evidence of tricuspid stenosis.  Aortic Valve: The aortic valve is tricuspid. Aortic valve regurgitation is not visualized. No aortic stenosis is present. Aortic valve mean gradient measures 4.0 mmHg. Aortic valve peak gradient measures 7.0 mmHg. Aortic valve area, by VTI measures 2.84 cm.  Pulmonic Valve: The pulmonic valve was grossly normal. Pulmonic valve regurgitation is not visualized. No evidence of pulmonic stenosis.  Aorta: The aortic root and ascending aorta are structurally normal, with no evidence of dilitation.  Venous: The inferior vena cava is normal in size with greater than 50% respiratory variability, suggesting right atrial pressure of 3 mmHg.  IAS/Shunts: The atrial septum is grossly normal.   LEFT VENTRICLE PLAX 2D LVIDd:         5.00 cm   Diastology LVIDs:         2.40 cm   LV e' medial:    6.09 cm/s LV PW:         1.00 cm   LV E/e' medial:  10.9 LV IVS:        1.10 cm   LV e' lateral:   10.90 cm/s LVOT diam:     2.30 cm   LV E/e' lateral: 6.1 LV SV:         70 LV SV Index:   34 LVOT Area:     4.15 cm   RIGHT  VENTRICLE RV Basal diam:  2.90 cm RV S prime:     17.10 cm/s TAPSE (M-mode): 1.8 cm  LEFT ATRIUM             Index        RIGHT ATRIUM           Index  LA diam:        4.20 cm 2.03 cm/m   RA Area:     10.40 cm LA Vol (A2C):   51.3 ml 24.79 ml/m  RA Volume:   19.00 ml  9.18 ml/m LA Vol (A4C):   42.6 ml 20.58 ml/m LA Biplane Vol: 48.7 ml 23.53 ml/m AORTIC VALVE AV Area (Vmax):    2.66 cm AV Area (Vmean):   2.52 cm AV Area (VTI):     2.84 cm AV Vmax:           132.00 cm/s AV Vmean:          88.200 cm/s AV VTI:            0.246 m AV Peak Grad:      7.0 mmHg AV Mean Grad:      4.0 mmHg LVOT Vmax:         84.40 cm/s LVOT Vmean:        53.600 cm/s LVOT VTI:          0.168 m LVOT/AV VTI ratio: 0.68  AORTA Ao Root diam: 3.70 cm Ao Asc diam:  3.50 cm  MITRAL VALVE               TRICUSPID VALVE MV Area (PHT): 3.39 cm    TR Peak grad:   31.6 mmHg MV Decel Time: 224 msec    TR Vmax:        281.00 cm/s MV E velocity: 66.30 cm/s MV A velocity: 85.30 cm/s  SHUNTS MV E/A ratio:  0.78        Systemic VTI:  0.17 m Systemic Diam: 2.30 cm  Lennie Odor MD Electronically signed by Lennie Odor MD Signature Date/Time: 06/16/2023/10:31:49 AM    Final   TEE  ECHO INTRAOPERATIVE TEE 06/24/2023  Narrative *INTRAOPERATIVE TRANSESOPHAGEAL REPORT *    Patient Name:   Clinton [name] Date of Exam: 06/24/2023 Medical Rec #:  [id number]         Height:       70.0 in Accession #:    [id number]        Weight:       195.0 lb Date of Birth:  1956/01/17        BSA:          2.06 m Patient Age:    67 years          BP:           123/65 mmHg Patient Gender: M                 HR:           45 bpm. Exam Location:  Anesthesiology  Transesophogeal exam was perform intraoperatively during surgical procedure. Patient was closely monitored under general anesthesia during the entirety of examination.  Indications:     CAD Performing Phys: [id number] HARRELL O LIGHTFOOT  Complications: No  known complications during this procedure. POST-OP IMPRESSIONS _ Left Ventricle: The left ventricle is unchanged from pre-bypass. _ Right Ventricle: The right ventricle appears unchanged from pre-bypass. _ Aorta: The aorta appears unchanged from pre-bypass. _ Left Atrial Appendage: The left atrial appendage appears unchanged from pre-bypass. _ Aortic Valve: The aortic valve appears unchanged from pre-bypass. _ Mitral Valve: The mitral valve appears unchanged from pre-bypass. _ Tricuspid Valve: The tricuspid valve appears unchanged from pre-bypass. _ Pulmonic Valve: The pulmonic valve appears unchanged from pre-bypass. _ Interatrial Septum: The interatrial septum appears unchanged from pre-bypass. _  Pericardium: The pericardium appears unchanged from pre-bypass. _ Comments: - S/P CABG X 2. No new or worsening wall motion or valvular issues.  PRE-OP FINDINGS Left Ventricle: The left ventricle has normal systolic function, with an ejection fraction of 60-65%. The cavity size was normal. No evidence of left ventricular regional wall motion abnormalities. Left ventricular diastolic parameters were normal.   Right Ventricle: The right ventricle has normal systolic function. The cavity was normal. There is no increase in right ventricular wall thickness.  Left Atrium: Left atrial size was normal in size. No left atrial/left atrial appendage thrombus was detected. Left atrial appendage velocity is normal at greater than 40 cm/s.  Right Atrium: Right atrial size was normal in size.  Interatrial Septum: No atrial level shunt detected by color flow Doppler. The interatrial septum appears to be lipomatous. There is no evidence of a patent foramen ovale.  Pericardium: There is no evidence of pericardial effusion. There is no pleural effusion.  Mitral Valve: The mitral valve is normal in structure. Mitral valve regurgitation is trivial by color flow Doppler. There is no evidence of mitral valve  vegetation. There is No evidence of mitral stenosis.  Tricuspid Valve: The tricuspid valve was normal in structure. Tricuspid valve regurgitation is trivial by color flow Doppler. No evidence of tricuspid stenosis is present. There is no evidence of tricuspid valve vegetation.  Aortic Valve: The aortic valve is tricuspid Aortic valve regurgitation was not visualized by color flow Doppler. There is no stenosis of the aortic valve, with a calculated valve area of 3.47 cm. There is no evidence of aortic valve vegetation.   Pulmonic Valve: The pulmonic valve was normal in structure. Pulmonic valve regurgitation is trivial by color flow Doppler.   Aorta: The aortic root and ascending aorta are normal in size and structure.  Venous: The inferior vena cava is normal in size with less than 50% respiratory variability, suggesting right atrial pressure of 8 mmHg.  Shunts: There is no evidence of an atrial septal defect.  +--------------+--------++ LEFT VENTRICLE          +----------------+---------++ +--------------+--------++  Diastology                PLAX 2D                 +----------------+---------++ +--------------+--------++  LV e' lateral:  7.31 cm/s LVIDd:        5.20 cm   +----------------+---------++ +--------------+--------++  LV E/e' lateral:8.3       LVIDs:        3.70 cm   +----------------+---------++ +--------------+--------++  LV e' medial:   6.28 cm/s LV PW:        0.90 cm   +----------------+---------++ +--------------+--------++  LV E/e' medial: 9.7       LV IVS:       1.00 cm   +----------------+---------++ +--------------+--------++ LVOT diam:    2.60 cm   +----------------------+-------++ +--------------+--------++  2D Longitudinal Strain        LV SV:        71 ml     +----------------------+-------++ +--------------+--------++  2D Strain GLS (A2C):  -13.7 % LV SV Index:  33.87      +----------------------+-------++ +--------------+--------++  2D Strain GLS (A3C):  -14.4 % LVOT Area:    5.31 cm  +----------------------+-------++ +--------------+--------++  2D Strain GLS (A4C):  -10.6 %                         +----------------------+-------++ +--------------+--------++  2D Strain GLS Avg:    -12.9 % +----------------------+-------++  +---------------+---------+---------+ RIGHT VENTRICLE                   +---------------+---------+---------+ RV S prime:    6.79 cm/s14.4 cm/s +---------------+---------+---------+ TAPSE (M-mode):1.6 cm   2.37 cm   +---------------+---------+---------+  +------------------+-----------++ AORTIC VALVE                  +------------------+-----------++ AV Area (Vmax):   2.95 cm    +------------------+-----------++ AV Area (Vmean):  2.89 cm    +------------------+-----------++ AV Area (VTI):    3.47 cm    +------------------+-----------++ AV Vmax:          106.00 cm/s +------------------+-----------++ AV Vmean:         71.000 cm/s +------------------+-----------++ AV VTI:           0.257 m     +------------------+-----------++ AV Peak Grad:     4.5 mmHg    +------------------+-----------++ AV Mean Grad:     2.0 mmHg    +------------------+-----------++ LVOT Vmax:        58.80 cm/s  +------------------+-----------++ LVOT Vmean:       38.700 cm/s +------------------+-----------++ LVOT VTI:         0.168 m     +------------------+-----------++ LVOT/AV VTI ratio:0.65        +------------------+-----------++  +--------------+-------++ AORTA                 +--------------+-------++ Ao Sinus diam:3.80 cm +--------------+-------++ Ao STJ diam:  2.4 cm  +--------------+-------++ Ao Asc diam:  2.90 cm +--------------+-------++  +--------------+----------++ MITRAL VALVE               +--------------+-------+ +--------------+----------++  SHUNTS                MV Area (PHT):4.10 cm    +--------------+-------+ +--------------+----------++  Systemic VTI: 0.17 m  MV Peak grad: 1.4 mmHg    +--------------+-------+ +--------------+----------++  Systemic Diam:2.60 cm MV Mean grad: 0.0 mmHg    +--------------+-------+ +--------------+----------++ MV Vmax:      0.60 m/s   +--------------+----------++ MV Vmean:     19.9 cm/s  +--------------+----------++ MV VTI:       0.19 m     +--------------+----------++ MV PHT:       53.65 msec +--------------+----------++ MV Decel Time:185 msec   +--------------+----------++ +--------------+----------++ MV E velocity:61.00 cm/s +--------------+----------++ MV A velocity:42.00 cm/s +--------------+----------++ MV E/A ratio: 1.45       +--------------+----------++   Hester Mates Electronically signed by Hester Mates Signature Date/Time: 06/24/2023/4:38:21 PM    Final    CT SCANS  CT CARDIAC SCORING (SELF PAY ONLY) 07/09/2022  Addendum 07/09/2022  3:22 PM ADDENDUM REPORT: 07/09/2022 15:19  CLINICAL DATA:  Cardiovascular Disease Risk stratification  EXAM: Coronary Calcium Score  TECHNIQUE: A gated, non-contrast computed tomography scan of the heart was performed using 3mm slice thickness. Axial images were analyzed on a dedicated workstation. Calcium scoring of the coronary arteries was performed using the Agatston method.  FINDINGS: Coronary arteries: Normal origins.  Coronary Calcium Score:  Left main: 0  Left anterior descending artery: 1336  Left circumflex artery: 0  Right coronary artery: 420  Total: 1756  Percentile: 95  Pericardium: Normal.  Ascending Aorta: Normal caliber.  38 mm.  Aortic atherosclerosis.  Non-cardiac: See separate report from [facility].  IMPRESSION: 1. Coronary calcium score of 1756. This was 95  percentile for age-, race-, and sex-matched controls.  2.  Aortic atherosclerosis.  RECOMMENDATIONS: Coronary  artery calcium (CAC) score is a strong predictor of incident coronary heart disease (CHD) and provides predictive information beyond traditional risk factors. CAC scoring is reasonable to use in the decision to withhold, postpone, or initiate statin therapy in intermediate-risk or selected borderline-risk asymptomatic adults (age 52-75 years and LDL-C >=70 to <190 mg/dL) who do not have diabetes or established atherosclerotic cardiovascular disease (ASCVD).* In intermediate-risk (10-year ASCVD risk >=7.5% to <20%) adults or selected borderline-risk (10-year ASCVD risk >=5% to <7.5%) adults in whom a CAC score is measured for the purpose of making a treatment decision the following recommendations have been made:  If CAC=0, it is reasonable to withhold statin therapy and reassess in 5 to 10 years, as long as higher risk conditions are absent (diabetes mellitus, family history of premature CHD in first degree relatives (males <55 years; females <65 years), cigarette smoking, or LDL >=190 mg/dL).  If CAC is 1 to 99, it is reasonable to initiate statin therapy for patients >=39 years of age.  If CAC is >=100 or >=75th percentile, it is reasonable to initiate statin therapy at any age.  Cardiology referral should be considered for patients with CAC scores >=400 or >=75th percentile.  *2018 AHA/ACC/AACVPR/AAPA/ABC/ACPM/ADA/AGS/APhA/ASPC/NLA/PCNA Guideline on the Management of Blood Cholesterol: A Report of the American College of Cardiology/American Heart Association Task Force on Clinical Practice Guidelines. J Am Coll Cardiol. 2019;73(24):3168-3209.   Electronically Signed By: Donato Schultz M.D. On: 07/09/2022 15:19  Narrative EXAM: OVER-READ INTERPRETATION  CT CHEST  The following report is an over-read performed by radiologist Dr. Alver Fisher [facility] on 07/09/2022. This over-read does not include interpretation of cardiac or coronary anatomy or pathology. The interpretation by the cardiologist is attached.  COMPARISON:  None.  FINDINGS: Scout view is unremarkable. Calcified mediastinal and hilar lymph nodes. Atherosclerotic calcification of the aorta. Small juxtadiaphragmatic and prepericardiac lymph nodes. Peribronchovascular and subpleural nodularity in the lungs. Degenerative changes in the spine.  IMPRESSION: 1. 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 2.  Aortic atherosclerosis (ICD10-I70.0).  Electronically Signed: By: Leanna Battles M.D. On: 07/09/2022 12:45          Risk Assessment/Calculations:             Physical Exam:   VS:  BP 134/64   Pulse 70   Ht 5\' 10"  (1.778 m)   Wt 201 lb 12.8 oz (91.5 kg)   SpO2 98%   BMI 28.96 kg/m    Wt Readings from Last 3 Encounters:  07/16/23 201 lb 12.8 oz (91.5 kg)  06/29/23 208 lb 15.9 oz (94.8 kg)  06/09/23 206 lb (93.4 kg)    GEN: Well nourished, well developed in no acute distress NECK: No JVD; No carotid bruits CARDIAC: RRR, no murmurs, rubs, gallops.  Sternotomy scar is well-healed RESPIRATORY:  Clear to auscultation without rales, wheezing or rhonchi  ABDOMEN: Soft, non-tender, non-distended EXTREMITIES:  No edema; No deformity   ASSESSMENT AND PLAN: .    Coronary Artery Disease Recent CABG x2 (LIMA-LAD and SVG to diagonal) on 06/24/2023 for 95% left main disease and 50% D1 disease. No current chest pain. EKG shows right bundle branch block. Discussed the importance of consistent medication regimen and potential risks of conduction blocks with increased metoprolol dose. -Continue metoprolol 12.5mg  twice daily. -Consider adding amlodipine 2.5mg  if blood pressure remains persistently over 140 systolic. -Check blood pressure daily, 2 hours after morning medication or at night after at least  10 minutes of  rest. -Continue Plavix for graft protection. -Continue Zetia for cholesterol management.  Hypertension Borderline elevated blood pressure. Discussed the importance of consistent medication regimen and monitoring. -Check blood pressure daily, 2 hours after morning medication or at night after at least 10 minutes of rest. -Notify office if systolic blood pressure is persistently over 140.  Hyperlipidemia LDL borderline high at 70, ideally should get it down to 55 or less. Discussed the importance of diet, activity, and exercise. -Continue Zetia.  Peripheral Artery Disease History of lower extremity PAD intervention with drug coated balloon angioplasty of popliteal artery in March 2024. No current symptoms. -Continue Plavix for graft protection.     Cardiac Rehabilitation Eligibility Assessment  The patient is ready to start cardiac rehabilitation pending clearance from the cardiac surgeon.       Dispo: Follow-up with Dr. Gery Pray as previously scheduled.  Signed, Azalee Course, PA

## 2023-07-21 ENCOUNTER — Other Ambulatory Visit: Payer: Self-pay | Admitting: Surgical

## 2023-07-21 NOTE — Progress Notes (Unsigned)
[address]       [place] [address]             [phone]     HPI: Mr. Clinton [name] is a 67 year old male with a past medical history of HTN, HLD, PAD (s/p drug coated balloon angioplasty of popliteal artery in March 2024)  and carotid artery occlusion. The patient returns for routine postoperative follow-up having undergone CABG x 2 by Dr. Cliffton Asters on 06/24/23. The patient's early postoperative recovery while in the hospital was notable for AKI that improved with time. He was seen by Azalee Course PA-C with cardiology on 12/05 and overall was reported to be doing well. His blood pressure was borderline elevated and Norvaxsc was to be started if it remained above 140s systolic during home BP checks. He remains on Plavix for his PAD. No other medication changes were made.  Since hospital discharge the patient reports chest soreness that is controlled with Tylenol. He has not taken any pain medication since surgery. He is back to working 1/2 days and is eager to restart full days of work. He denies fatigue, SOB, dizziness, LOC and lower extremity edema.   Current Outpatient Medications  Medication Sig Dispense Refill   aspirin EC 81 MG tablet Take 81 mg by mouth at bedtime.     atorvastatin (LIPITOR) 80 MG tablet Take 80 mg by mouth at bedtime.     clopidogrel (PLAVIX) 75 MG tablet Take 1 tablet (75 mg total) by mouth daily. 30 tablet 11   ezetimibe (ZETIA) 10 MG tablet Take 1 tablet (10 mg total) by mouth daily. 30 tablet 1   Fe Fum-Vit C-Vit B12-FA (TRIGELS-F FORTE) CAPS capsule Take 1 capsule by mouth daily. 30 capsule 0   gabapentin (NEURONTIN) 300 MG capsule Take 1 capsule (300 mg total) by mouth 2 (two) times daily. 60 capsule 0   metoprolol tartrate (LOPRESSOR) 25 MG tablet Take 0.5 tablets (12.5 mg total) by mouth 2 (two) times daily. 30 tablet 1   Multiple Vitamin (MULTIVITAMIN) capsule Take 2 capsules by mouth every morning. Gummy     Omega-3 Fatty Acids (OMEGA 3 PO)  Take 2 capsules by mouth daily. 454 mg each     Probiotic Product (PROBIOTIC PO) Take 220 mg by mouth daily.     terbinafine (LAMISIL) 250 MG tablet Take 250 mg by mouth daily.     vitamin C (ASCORBIC ACID) 250 MG tablet Take 500 mg by mouth daily.     zinc sulfate 220 (50 Zn) MG capsule Take 1 capsule (220 mg total) by mouth daily.     No current facility-administered medications for this visit.   Vitals: Today's Vitals   07/29/23 1508  BP: (!) 145/80  Pulse: 78  Resp: 18  SpO2: 97%   There is no height or weight on file to calculate BMI.  Physical Exam: General: Alert and oriented, no acute distress Neuro: Grossly intact CV: Regular rate and rhythm, no murmur Pulm: Clear to auscultation bilaterally GI: +BS, nontender, no distension Extremities: No edema Wound: Clean and dry and healing well  Diagnostic Tests: CLINICAL DATA:  Status post coronary artery bypass graft.   EXAM: CHEST - 2 VIEW   COMPARISON:  June 26, 2023.  July 09, 2022.   FINDINGS: Stable cardiomediastinal silhouette. Sternotomy wires are noted. Stable bilateral pulmonary nodules are noted suggestive of sarcoidosis. No pneumothorax is noted. Bony thorax is unremarkable. Small left pleural effusion is noted.  IMPRESSION: Small left pleural effusion. Stable chronic findings suggestive of sarcoidosis.     Electronically Signed   By: Lupita Raider M.D.   On: 07/29/2023 16:00    Impression/Plan S/P CABG: The patient is progressing very well from surgery. He has some sternal soreness but this is relieved with only Tylenol. He denies SOB and states he is ambulating well and working half days without difficulty. 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 He has a small left pleural effusion on CXR that will likely resolve with time. His incisions are healing well without sign of infection. I cleared him to drive  and for cardiac rehab. I will add on Norvasc 2.5mg  for hypertension as previously discussed with the patient by Azalee Course PA-C if his blood pressures remained elevated over 140. The patient states he checked them at home for about 1 week and they remained in the 130s-150s. I will not make any other medication changes at this time. Plan to have the patient return to the clinic as needed.   PAD: Followed by vascular surgery  HTN: Restart Norvasc 2.5mg  daily as discussed at his previous cardiology appointment if systolic pressures were to remain over 140.  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 It looks like this has been present in previous CT and CXR findings as well. Patient has never been told this or seen pulmonology. Will refer to pulmonology for further workup.    Jenny Reichmann, PA-C [facility] [phone]

## 2023-07-26 ENCOUNTER — Encounter: Payer: Self-pay | Admitting: Orthopedic Surgery

## 2023-07-28 ENCOUNTER — Other Ambulatory Visit: Payer: Self-pay | Admitting: Thoracic Surgery (Cardiothoracic Vascular Surgery)

## 2023-07-28 DIAGNOSIS — Z951 Presence of aortocoronary bypass graft: Secondary | ICD-10-CM

## 2023-07-29 ENCOUNTER — Encounter: Payer: Self-pay | Admitting: Physician Assistant

## 2023-07-29 ENCOUNTER — Ambulatory Visit (INDEPENDENT_AMBULATORY_CARE_PROVIDER_SITE_OTHER): Payer: Self-pay | Admitting: Physician Assistant

## 2023-07-29 ENCOUNTER — Ambulatory Visit
Admission: RE | Admit: 2023-07-29 | Discharge: 2023-07-29 | Disposition: A | Discharge status: Home / Self Care | Payer: 59 | Source: Ambulatory Visit | Attending: Thoracic Surgery (Cardiothoracic Vascular Surgery) | Admitting: Thoracic Surgery (Cardiothoracic Vascular Surgery)

## 2023-07-29 VITALS — BP 145/80 | HR 78 | Resp 18

## 2023-07-29 DIAGNOSIS — Z951 Presence of aortocoronary bypass graft: Secondary | ICD-10-CM

## 2023-07-29 NOTE — Patient Instructions (Addendum)
You are encouraged to enroll and participate in the outpatient cardiac rehab program beginning as soon as practical.  You may return to driving an automobile as long as you are no longer requiring oral narcotic pain relievers during the daytime.  It would be wise to start driving only short distances during the daylight and gradually increase from there as you feel comfortable.  Continue to avoid any heavy lifting or strenuous use of your arms or shoulders for at least a total of three months from the time of surgery.  After three months you may gradually increase how much you lift or otherwise use your arms or chest as tolerated, with limits based upon whether or not activities lead to the return of significant discomfort.  Restart Amlodipine (Norvasc) 2.5mg  daily

## 2023-07-30 ENCOUNTER — Ambulatory Visit (INDEPENDENT_AMBULATORY_CARE_PROVIDER_SITE_OTHER): Payer: 59 | Admitting: Orthopedic Surgery

## 2023-07-30 DIAGNOSIS — Z89511 Acquired absence of right leg below knee: Secondary | ICD-10-CM | POA: Diagnosis not present

## 2023-07-30 DIAGNOSIS — S88111A Complete traumatic amputation at level between knee and ankle, right lower leg, initial encounter: Secondary | ICD-10-CM

## 2023-08-02 ENCOUNTER — Encounter: Payer: Self-pay | Admitting: Orthopedic Surgery

## 2023-08-02 NOTE — Progress Notes (Signed)
Office Visit Note   Patient: Clinton [name]           Date of Birth: 21-Oct-1955           MRN: [id number] Visit Date: 07/30/2023              Requested by: Darrow Bussing, MD [address] [place],  [place] [address] PCP: Darrow Bussing, MD  Chief Complaint  Patient presents with   Right Leg - Follow-up    Hx BKA      HPI: Patient is seen in follow-up for right below-knee amputation.  Patient is concerned for some raised skin edges along the residual limb.  Patient is status post coronary artery bypass surgery in November.  Assessment & Plan: Visit Diagnoses:  1. Below-knee amputation of right lower extremity, initial encounter [facility])     Plan: Patient will follow-up with [facility] for medial and lateral padding to unload the residual limb.  Follow-Up Instructions: Return if symptoms worsen or fail to improve.   Ortho Exam  Patient is alert, oriented, no adenopathy, well-dressed, normal affect, normal respiratory effort. Examination of the residual limb he does have callus secondary to an bearing ulcer.  There is also tenderness to palpation of the fibular head secondary to subsidence.  His socket is 2 months old.  No redness no cellulitis no open wounds.  Imaging: No results found. No images are attached to the encounter.  Labs: Lab Results  Component Value Date   HGBA1C 5.3 06/23/2023   HGBA1C 5.5 06/16/2023   HGBA1C 5.4 04/15/2022   ESRSEDRATE 14 04/08/2022   CRP 2.1 04/08/2022   REPTSTATUS 06/25/2022 FINAL 06/20/2022   GRAMSTAIN  06/20/2022    ABUNDANT WBC PRESENT,BOTH PMN AND MONONUCLEAR NO ORGANISMS SEEN    CULT  06/20/2022    RARE HAEMOPHILUS PARAINFLUENZAE BETA LACTAMASE NEGATIVE FEW GEMELLA MORBILLORUM Standardized susceptibility testing for this organism is not available. Performed at [facility], [address], [place], [place] [address]      Lab Results  Component Value Date   ALBUMIN 3.6 06/20/2022   ALBUMIN 2.3  (L) 05/15/2022   ALBUMIN 3.9 08/27/2015    Lab Results  Component Value Date   MG 2.8 (H) 06/25/2023   MG 2.7 (H) 06/25/2023   MG 3.5 (H) 06/24/2023   No results found for: "VD25OH"  No results found for: "PREALBUMIN"    Latest Ref Rng & Units 06/27/2023    3:56 AM 06/26/2023    2:48 AM 06/25/2023    5:23 PM  CBC EXTENDED  WBC 4.0 - 10.5 K/uL 6.6  7.9  9.7   RBC 4.22 - 5.81 MIL/uL 2.57  2.72  3.02   Hemoglobin 13.0 - 17.0 g/dL 8.5  8.8  9.8   HCT 44.0 - 52.0 % 25.4  27.0  29.7   Platelets 150 - 400 K/uL 154  161  189      There is no height or weight on file to calculate BMI.  Orders:  No orders of the defined types were placed in this encounter.  No orders of the defined types were placed in this encounter.    Procedures: No procedures performed  Clinical Data: No additional findings.  ROS:  All other systems negative, except as noted in the HPI. Review of Systems  Objective: Vital Signs: There were no vitals taken for this visit.  Specialty Comments:  No specialty comments available.  PMFS History: Patient Active Problem List  Diagnosis Date Noted   S/P CABG x 2 06/24/2023   Acute postoperative respiratory insufficiency 06/24/2023   Coronary artery disease involving native coronary artery of native heart with angina pectoris (HCC) 06/15/2023   Elevated coronary artery calcium score 08/06/2022   Dehiscence of amputation stump of right lower extremity (HCC) 06/20/2022   Acute blood loss anemia 05/19/2022   Post-op pain 05/19/2022   Acute renal failure (HCC) 05/19/2022   S/P BKA (below knee amputation), right (HCC) 05/14/2022   Below-knee amputation of right lower extremity (HCC) 05/09/2022   Gangrene of right foot (HCC)    PAD (peripheral artery disease) (HCC) 04/17/2022   Acute osteomyelitis of metatarsal bone of right foot (HCC)    Atherosclerosis of native arteries of the extremities with ulceration (HCC) 04/15/2022   Peripheral arterial  disease (HCC) 03/25/2022   Obesity 03/21/2022   Prediabetes 03/21/2022   Erectile dysfunction 07/26/2021   Family history of ischemic heart disease (IHD) 07/26/2021   Pure hypercholesterolemia 07/26/2021   History of adenomatous polyp of colon 02/03/2019   Internal hemorrhoids 02/03/2019   Carotid artery stenosis, asymptomatic 09/05/2015   Cellulitis    Hypertension    Hyperlipidemia    Occlusion and stenosis of carotid artery without mention of cerebral infarction 01/01/2012   Past Medical History:  Diagnosis Date   Carotid artery occlusion    Cellulitis    Erectile dysfunction    Hyperlipidemia    Hypertension    Peripheral vascular disease (HCC)     Family History  Problem Relation Age of Onset   Other Mother        circulatory problems   Heart attack Mother    Deep vein thrombosis Mother    Heart disease Mother    Hyperlipidemia Father    Hypertension Father    Diabetes Father    Deep vein thrombosis Father    Heart disease Father        before age 69   Heart attack Father    Peripheral vascular disease Father    Hypertension Brother    Hyperlipidemia Brother     Past Surgical History:  Procedure Laterality Date   ABDOMINAL AORTOGRAM W/LOWER EXTREMITY N/A 02/04/2022   Procedure: ABDOMINAL AORTOGRAM W/LOWER EXTREMITY;  Surgeon: Nada Libman, MD;  Location: [facility];  Service: Cardiovascular;  Laterality: N/A;   ABDOMINAL AORTOGRAM W/LOWER EXTREMITY N/A 02/18/2022   Procedure: ABDOMINAL AORTOGRAM W/LOWER EXTREMITY;  Surgeon: Nada Libman, MD;  Location: [facility];  Service: Cardiovascular;  Laterality: N/A;   ABDOMINAL AORTOGRAM W/LOWER EXTREMITY N/A 04/15/2022   Procedure: ABDOMINAL AORTOGRAM W/LOWER EXTREMITY;  Surgeon: Nada Libman, MD;  Location: [facility];  Service: Cardiovascular;  Laterality: N/A;   ABDOMINAL AORTOGRAM W/LOWER EXTREMITY N/A 10/28/2022   Procedure: ABDOMINAL AORTOGRAM W/LOWER EXTREMITY;  Surgeon: Nada Libman, MD;  Location: [facility];  Service: Cardiovascular;  Laterality: N/A;   AMPUTATION Right 04/16/2022   Procedure: RIGHT 5TH RAY AMPUTATION;  Surgeon: Nadara Mustard, MD;  Location: [facility] OR;  Service: Orthopedics;  Laterality: Right;   AMPUTATION Right 05/09/2022   Procedure: RIGHT BELOW KNEE AMPUTATION;  Surgeon: Nadara Mustard, MD;  Location: [facility];  Service: Orthopedics;  Laterality: Right;   APPLICATION OF WOUND VAC Right 06/20/2022   Procedure: APPLICATION OF WOUND VAC;  Surgeon: Nadara Mustard, MD;  Location: [facility];  Service: Orthopedics;  Laterality: Right;   COLONOSCOPY     CORONARY ARTERY BYPASS GRAFT N/A  06/24/2023   Procedure: CORONARY ARTERY BYPASS GRAFTING (CABG) X TWO USING LEFT INTERNAL MAMMARY ARTERY AND RIGHT GREATER SAPHENOUS VEIN HARVESTED ENDOSCOPICALLY;  Surgeon: Corliss Skains, MD;  Location: [facility] OR;  Service: Open Heart Surgery;  Laterality: N/A;   ENDARTERECTOMY Right 09/05/2015   Procedure: ENDARTERECTOMY CAROTID RIGHT;  Surgeon: Sherren Kerns, MD;  Location[facility];  Service: Vascular;  Laterality: Right;   LEFT HEART CATH AND CORONARY ANGIOGRAPHY N/A 06/15/2023   Procedure: LEFT HEART CATH AND CORONARY ANGIOGRAPHY;  Surgeon: [name], MD;  Location: [facility];  Service: Cardiovascular;  Laterality: N/A;   PATCH ANGIOPLASTY  09/05/2015   Procedure: PATCH ANGIOPLASTY RIGHT CAROTID ARTERY USING HEMASHIELD PLATINUM FINESSE PATCH;  Surgeon: Sherren Kerns, MD;  Location: [facility] OR;  Service: Vascular;;   PERIPHERAL INTRAVASCULAR LITHOTRIPSY Left 10/28/2022   Procedure: INTRAVASCULAR LITHOTRIPSY;  Surgeon: Nada Libman, MD;  Location: [facility];  Service: Cardiovascular;  Laterality: Left;  L peroneal   PERIPHERAL VASCULAR ATHERECTOMY Left 02/18/2022   Procedure: PERIPHERAL VASCULAR ATHERECTOMY;  Surgeon: Nada Libman, MD;  Location: [facility];  Service: Cardiovascular;  Laterality: Left;  Popliteal and Peroneal    PERIPHERAL VASCULAR BALLOON ANGIOPLASTY  02/04/2022   Procedure: PERIPHERAL VASCULAR BALLOON ANGIOPLASTY;  Surgeon: Nada Libman, MD;  [facility]: [facility];  Service: Cardiovascular;;   PERIPHERAL VASCULAR BALLOON ANGIOPLASTY Right 04/15/2022   Procedure: PERIPHERAL VASCULAR BALLOON ANGIOPLASTY;  Surgeon: Nada Libman, MD;  Location: [facility];  Service: Cardiovascular;  Laterality: Right;  SFA/POPLITEAL   PERIPHERAL VASCULAR BALLOON ANGIOPLASTY Left 10/28/2022   Procedure: PERIPHERAL VASCULAR BALLOON ANGIOPLASTY;  Surgeon: Nada Libman, MD;  Location: [facility];  Service: Cardiovascular;  Laterality: Left;  L peroneal and L pop   STUMP REVISION Right 06/20/2022   Procedure: REVISION RIGHT BELOW KNEE AMPUTATION;  Surgeon: Nadara Mustard, MD;  Location: [facility] OR;  Service: Orthopedics;  Laterality: Right;   TEE WITHOUT CARDIOVERSION N/A 06/24/2023   Procedure: TRANSESOPHAGEAL ECHOCARDIOGRAM;  Surgeon: Corliss Skains, MD;  Location: [facility];  Service: Open Heart Surgery;  Laterality: N/A;   TONSILLECTOMY     VASECTOMY     Social History   Occupational History   Not on file  Tobacco Use   Smoking status: Never   Smokeless tobacco: Former    Types: Chew    Quit date: 01/01/2015  Vaping Use   Vaping status: Never Used  Substance and Sexual Activity   Alcohol use: Not Currently   Drug use: Not on file   Sexual activity: Not on file

## 2023-08-03 ENCOUNTER — Telehealth (HOSPITAL_COMMUNITY): Payer: Self-pay

## 2023-08-03 NOTE — Telephone Encounter (Signed)
Called and spoke with pt in regards to CR, pt stated he has to check his work schedule and would like to start CR with his son.

## 2023-08-07 ENCOUNTER — Ambulatory Visit: Payer: 59 | Admitting: Cardiovascular Disease

## 2023-08-09 ENCOUNTER — Encounter: Payer: Self-pay | Admitting: Cardiovascular Disease

## 2023-08-10 ENCOUNTER — Telehealth (HOSPITAL_COMMUNITY): Payer: Self-pay

## 2023-08-10 NOTE — Telephone Encounter (Signed)
Called and spoke with pt in regards to CR, pt stated he is not interested at this time due to his work schedule.  Closed referral 

## 2023-08-17 ENCOUNTER — Encounter: Payer: Self-pay | Admitting: Pharmacist Clinician (PhC)/ Clinical Pharmacy Specialist

## 2023-08-17 ENCOUNTER — Ambulatory Visit: Payer: 59

## 2023-08-17 ENCOUNTER — Ambulatory Visit
Payer: Commercial Managed Care - PPO | Attending: Cardiology | Admitting: Pharmacist Clinician (PhC)/ Clinical Pharmacy Specialist

## 2023-08-17 VITALS — BP 160/79 | HR 64

## 2023-08-17 DIAGNOSIS — I10 Essential (primary) hypertension: Secondary | ICD-10-CM

## 2023-08-17 MED ORDER — AMLODIPINE BESYLATE 5 MG PO TABS: 5.0000 mg | ORAL_TABLET | Freq: Every day | ORAL | 3 refills | Status: DC

## 2023-08-17 NOTE — Patient Instructions (Signed)
 Follow up appointment: with Dr. Court on March 12  Take your BP meds as follows:  Increase amlodipine  to 5 mg once daily in the evenings.   Check your blood pressure at home daily (if able) and keep record of the readings.  Your blood pressure goal is < 130/80  To check your pressure at home you will need to:  1. Sit up in a chair, with feet flat on the floor and back supported. Do not cross your ankles or legs. 2. Rest your left arm so that the cuff is about heart level. If the cuff goes on your upper arm,  then just relax the arm on the table, arm of the chair or your lap. If you have a wrist cuff, we  suggest relaxing your wrist against your chest (think of it as Pledging the Flag with the  wrong arm).  3. Place the cuff snugly around your arm, about 1 inch above the crook of your elbow. The  cords should be inside the groove of your elbow.  4. Sit quietly, with the cuff in place, for about 5 minutes. After that 5 minutes press the power  button to start a reading. 5. Do not talk or move while the reading is taking place.  6. Record your readings on a sheet of paper. Although most cuffs have a memory, it is often  easier to see a pattern developing when the numbers are all in front of you.  7. You can repeat the reading after 1-3 minutes if it is recommended  Make sure your bladder is empty and you have not had caffeine or tobacco within the last 30 min  Always bring your blood pressure log with you to your appointments. If you have not brought your monitor in to be double checked for accuracy, please bring it to your next appointment.  You can find a list of quality blood pressure cuffs at [email or web address].no  Important lifestyle changes to control high blood pressure  Intervention  Effect on the BP  Lose extra pounds and watch your waistline Weight loss is one of the most effective lifestyle changes for controlling blood pressure. If you're overweight or obese, losing even a small  amount of weight can help reduce blood pressure. Blood pressure might go down by about 1 millimeter of mercury (mm Hg) with each kilogram (about 2.2 pounds) of weight lost.  Exercise regularly As a general goal, aim for at least 30 minutes of moderate physical activity every day. Regular physical activity can lower high blood pressure by about 5 to 8 mm Hg.  Eat a healthy diet Eating a diet rich in whole grains, fruits, vegetables, and low-fat dairy products and low in saturated fat and cholesterol. A healthy diet can lower high blood pressure by up to 11 mm Hg.  Reduce salt (sodium) in your diet Even a small reduction of sodium in the diet can improve heart health and reduce high blood pressure by about 5 to 6 mm Hg.  Limit alcohol One drink equals 12 ounces of beer, 5 ounces of wine, or 1.5 ounces of 80-proof liquor.  Limiting alcohol to less than one drink a day for women or two drinks a day for men can help lower blood pressure by about 4 mm Hg.   If you have any questions or concerns please use My Chart to send questions or call the office at [phone]

## 2023-08-17 NOTE — Progress Notes (Signed)
 Office Visit    Patient Name: Clinton [name] Date of Encounter: 08/17/2023  Primary Care Provider:  Regino Slater, MD Primary Cardiologist:  Dorn Lesches, MD  Chief Complaint    Hypertension  Significant Past Medical History   CAD 11/23 CAC = 1756, 11/24 CABG x 2,   PAD 2023 Right BKA, CEA  HLD 11/24 LDL 70 on atorvastatin  80    Allergies  Allergen Reactions   Penicillins Rash    Has patient had a PCN reaction causing immediate rash, facial/tongue/throat swelling, SOB or lightheadedness with hypotension: Yes Has patient had a PCN reaction causing severe rash involving mucus membranes or skin necrosis: No Has patient had a PCN reaction that required hospitalization No Has patient had a PCN reaction occurring within the last 10 years: No If all of the above answers are NO, then may proceed with Cephalosporin use.  Tolerated Cefazolin  on 04/16/22    Sulfa  Antibiotics Swelling and Other (See Comments)    Facial/lip swelling    History of Present Illness    Clinton [name] is a 68 y.o. male patient of Dr Lesches, in the office today for hypertension evaluation.   Checking at home 136-142 at home, has not checked since going back on amlodipine   Blood Pressure Goal:  130/80  Current Medications:    Previously tried:    Family Hx:   father died 14 MI, lifeliong smoker, no exercise, mother with PAD; 2 sons (one 55, with aortic valve replacement, other biventricluar non-compaction)  Social Hx:      Tobacco: quit smokeless tobacco 7-8 years ago  2017  Alcohol: only occasionally  Caffeine: 1-1.5 coffee per day  Diet:   home except for Friday nights,  variety of protein, mostly chicken; daily vegetables - frozen or fresh  Exercise: starts cardiac rehab soon  Home BP readings:    home device 42-10 years old arm cuff.    Adherence Assessment  Do you ever forget to take your medication? [] Yes [] No  Do you ever skip doses due to side effects? [] Yes [] No  Do you  have trouble affording your medicines? [] Yes [] No  Are you ever unable to pick up your medication due to transportation difficulties? [] Yes [] No   Adherence strategy: 7 day minder  Accessory Clinical Findings    Lab Results  Component Value Date   CREATININE 1.35 (H) 06/28/2023   BUN 18 06/28/2023   NA 136 06/28/2023   K 4.0 06/28/2023   CL 104 06/28/2023   CO2 25 06/28/2023   Lab Results  Component Value Date   ALT 23 06/20/2022   AST 20 06/20/2022   ALKPHOS 61 06/20/2022   BILITOT 0.8 06/20/2022   Lab Results  Component Value Date   HGBA1C 5.3 06/23/2023    Home Medications    Current Outpatient Medications  Medication Sig Dispense Refill   amLODipine  (NORVASC ) 5 MG tablet Take 1 tablet (5 mg total) by mouth daily. 90 tablet 3   aspirin  EC 81 MG tablet Take 81 mg by mouth at bedtime.     atorvastatin  (LIPITOR ) 80 MG tablet Take 80 mg by mouth at bedtime.     clopidogrel  (PLAVIX ) 75 MG tablet Take 1 tablet (75 mg total) by mouth daily. 30 tablet 11   ezetimibe  (ZETIA ) 10 MG tablet Take 1 tablet (10 mg total) by mouth daily. 30 tablet 1   Fe Fum-Vit C-Vit B12-FA (TRIGELS-F FORTE) CAPS capsule Take 1 capsule by mouth daily. 30 capsule 0  metoprolol  tartrate (LOPRESSOR ) 25 MG tablet Take 0.5 tablets (12.5 mg total) by mouth 2 (two) times daily. 30 tablet 1   Multiple Vitamin (MULTIVITAMIN) capsule Take 2 capsules by mouth every morning. Gummy     Omega-3 Fatty Acids (OMEGA 3 PO) Take 2 capsules by mouth daily. 454 mg each     Probiotic Product (PROBIOTIC PO) Take 220 mg by mouth daily.     vitamin C  (ASCORBIC ACID ) 250 MG tablet Take 500 mg by mouth daily.     zinc  sulfate 220 (50 Zn) MG capsule Take 1 capsule (220 mg total) by mouth daily.     No current facility-administered medications for this visit.     HYPERTENSION CONTROL Vitals:   08/17/23 1153 08/17/23 1157  BP: (!) 179/79 (!) 160/79    The patient's blood pressure is elevated above target today.  In  order to address the patient's elevated BP: A current anti-hypertensive medication was adjusted today.; Blood pressure will be monitored at home to determine if medication changes need to be made.      Assessment & Plan    Hypertension Assessment: BP is uncontrolled in office BP 1160/79 mmHg;  above the goal (<130/80). States home readings have been better, but hasn't checked in several weeks Tolerates amlodipine  2.5 mg well without any side effects Denies SOB, palpitation, chest pain, headaches,or swelling Reiterated the importance of regular exercise and low salt diet   Plan:  Increase amlodipine  to 5 mg once daily in the evenings Patient to keep record of BP readings with heart rate and report to us  at the next visit Patient to follow up with Dr> Court in 6 weeks (already scheduled)  Labs ordered today:  none   Allean Mink PharmD CPP [facility]  [address] [place], [place] [address] [phone]

## 2023-08-17 NOTE — Assessment & Plan Note (Signed)
 Assessment: BP is uncontrolled in office BP 1160/79 mmHg;  above the goal (<130/80). States home readings have been better, but hasn't checked in several weeks Tolerates amlodipine  2.5 mg well without any side effects Denies SOB, palpitation, chest pain, headaches,or swelling Reiterated the importance of regular exercise and low salt diet   Plan:  Increase amlodipine  to 5 mg once daily in the evenings Patient to keep record of BP readings with heart rate and report to us  at the next visit Patient to follow up with Dr> Court in 6 weeks (already scheduled)  Labs ordered today:  none

## 2023-08-18 ENCOUNTER — Other Ambulatory Visit: Payer: Self-pay | Admitting: Surgical

## 2023-08-22 ENCOUNTER — Other Ambulatory Visit: Payer: Self-pay | Admitting: Surgical

## 2023-08-24 ENCOUNTER — Telehealth: Payer: Self-pay | Admitting: Cardiovascular Disease

## 2023-08-24 MED ORDER — METOPROLOL TARTRATE 25 MG PO TABS: 12.5000 mg | ORAL_TABLET | Freq: Two times a day (BID) | ORAL | 3 refills | Status: DC

## 2023-08-24 MED ORDER — EZETIMIBE 10 MG PO TABS: 10.0000 mg | ORAL_TABLET | Freq: Every day | ORAL | 3 refills | Status: DC

## 2023-08-24 NOTE — Telephone Encounter (Signed)
 Patient called back to ask about this message:  Good Morning   Clinton [name] has 2 prescriptions we think he needs refilled.   Metoprolol  25 mg 0 refills   Ezetimibe  10 mg 0 refills   Please let us  know if he needs to get these refilled and also please contact [facility] as well.   Thank you   Randie

## 2023-08-24 NOTE — Telephone Encounter (Signed)
 Prescriptions sent to pharmacy,

## 2023-09-02 ENCOUNTER — Other Ambulatory Visit: Payer: Self-pay | Admitting: Surgical

## 2023-09-30 ENCOUNTER — Encounter: Payer: Commercial Managed Care - PPO | Admitting: Physical Medicine and Rehabilitation

## 2023-10-03 ENCOUNTER — Encounter: Payer: Self-pay | Admitting: Cardiovascular Disease

## 2023-10-09 ENCOUNTER — Telehealth (HOSPITAL_COMMUNITY): Payer: Self-pay

## 2023-10-09 NOTE — Telephone Encounter (Signed)
 Pt insurance is active and benefits verified through [facility]. Co-pay $0.00, DED $1,500.00/$114.99 met, out of pocket $5,000.00/$114.99 met, co-insurance 20%. No pre-authorization required. Sylvia/[facility], 10/09/23 @ 3:22PM, ZOX#[id number]   How many CR sessions are covered?ICR 36 visits Is this a lifetime maximum or an annual maximum? Annual Has the member used any of these services to date? No Is there a time limit (weeks/months) on start of program and/or program completion? No

## 2023-10-09 NOTE — Telephone Encounter (Signed)
 Pt called and stated he is now interested in CR. Patient will come in for orientation on 10/12/23 @ 130PM and will attend the 1230PM exercise class.   Pensions consultant.

## 2023-10-09 NOTE — Telephone Encounter (Signed)
  Called pt to confirm appt for 10/12/23 at 1330. Gave pt instructions for appt, what to wear, office address, eating/taking meds before, and if sick to call and reschedule. Pt voiced understanding, all questions answered.   Health history completed? Yes   Jonna Coup, MS, ACSM-CEP 10/09/2023 4:56 PM

## 2023-10-12 ENCOUNTER — Encounter (HOSPITAL_COMMUNITY)
Admission: RE | Admit: 2023-10-12 | Discharge: 2023-10-12 | Disposition: A | Discharge status: Home / Self Care | Payer: Commercial Managed Care - PPO | Source: Ambulatory Visit | Attending: Thoracic Surgery (Cardiothoracic Vascular Surgery) | Admitting: Thoracic Surgery (Cardiothoracic Vascular Surgery)

## 2023-10-12 VITALS — BP 118/66 | HR 69 | Ht 70.0 in | Wt 205.0 lb

## 2023-10-12 DIAGNOSIS — Z48812 Encounter for surgical aftercare following surgery on the circulatory system: Secondary | ICD-10-CM | POA: Insufficient documentation

## 2023-10-12 DIAGNOSIS — Z79899 Other long term (current) drug therapy: Secondary | ICD-10-CM | POA: Diagnosis not present

## 2023-10-12 DIAGNOSIS — Z7902 Long term (current) use of antithrombotics/antiplatelets: Secondary | ICD-10-CM | POA: Diagnosis not present

## 2023-10-12 DIAGNOSIS — Z951 Presence of aortocoronary bypass graft: Secondary | ICD-10-CM | POA: Insufficient documentation

## 2023-10-12 NOTE — Progress Notes (Signed)
 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  Patient's Home Medications on Admission:  Current Outpatient Medications:    amLODipine (NORVASC) 5 MG tablet, Take 1 tablet (5 mg total) by mouth daily., Disp: 90 tablet, Rfl: 3   aspirin EC 81 MG tablet, Take 81 mg by mouth at bedtime., Disp: , Rfl:    atorvastatin (LIPITOR) 80 MG tablet, Take 80 mg by mouth at bedtime., Disp: , Rfl:    clopidogrel (PLAVIX) 75 MG tablet, Take 1 tablet (75 mg total) by mouth daily., Disp: 30 tablet, Rfl: 11   ezetimibe (ZETIA) 10 MG tablet, Take 1 tablet (10 mg total) by mouth daily., Disp: 90 tablet, Rfl: 3   Fe Fum-Vit C-Vit B12-FA (TRIGELS-F FORTE) CAPS capsule, Take 1 capsule by mouth daily., Disp: 30 capsule, Rfl: 0   metoprolol tartrate (LOPRESSOR) 25 MG tablet, Take 0.5 tablets (12.5 mg total) by mouth 2 (two) times daily., Disp: 90 tablet, Rfl: 3   Multiple Vitamin (MULTIVITAMIN) capsule, Take 2 capsules by mouth every morning. Gummy, Disp: , Rfl:    Omega-3 Fatty Acids (OMEGA 3 PO), Take 2 capsules by mouth daily. 454 mg each, Disp: , Rfl:    Probiotic Product (PROBIOTIC PO), Take 220 mg by mouth daily., Disp: , Rfl:    vitamin C (ASCORBIC ACID) 250 MG tablet, Take 500 mg by mouth daily., Disp: , Rfl:    zinc sulfate 220 (50 Zn) MG capsule, Take 1 capsule (220 mg total) by mouth daily., Disp: , Rfl:   Past Medical History: Past Medical History:  Diagnosis Date   Carotid artery occlusion    Cellulitis     Erectile dysfunction    Hyperlipidemia    Hypertension    Peripheral vascular disease (HCC)     Tobacco Use: Social History   Tobacco Use  Smoking Status Never  Smokeless Tobacco Former   Types: Chew   Quit date: 01/01/2015    Labs: Review Flowsheet  More data exists      Latest Ref Rng & Units 10/28/2022 06/16/2023 06/22/2023 06/23/2023 06/24/2023  Labs for ITP Cardiac and Pulmonary Rehab  Cholestrol 0 - 200 mg/dL - 846  - - -  LDL (calc) 0 - 99 mg/dL - 70  - - -  HDL-C >96 mg/dL - 50  - - -  Trlycerides <150 mg/dL - 52  - - -  Hemoglobin A1c 4.8 - 5.6 % - 5.5  - 5.3  -  PH, Arterial 7.35 - 7.45 - - 7.42  - 7.302  7.232  7.473  7.474  7.447  7.528  7.383   PCO2 arterial 32 - 48 mmHg - - 39  - 40.0  44.2  25.6  32.9  34.5  28.4  40.9   Bicarbonate 20.0 - 28.0 mmol/L - - 25.3  - 19.5  18.6  19.0  24.2  23.9  23.3  23.6  24.4   TCO2 22 - 32 mmol/L 28  - - - 21  20  20  25  27  27  25  24  24  24  26  27    Acid-base deficit 0.0 - 2.0 mmol/L - - - - 6.0  8.0  4.0  1.0   O2 Saturation % - - 98.1  - 98  98  98  100  100  82  100  100     Details       Multiple values from one day are sorted in reverse-chronological order         Capillary Blood Glucose: Lab Results  Component Value Date   GLUCAP 135 (H) 06/27/2023   GLUCAP 100 (H) 06/27/2023   GLUCAP 99 06/27/2023   GLUCAP 95 06/26/2023   GLUCAP 138 (H) 06/26/2023     Exercise Target Goals: Exercise Program Goal: Individual exercise prescription set using results from initial 6 min walk test and THRR while considering  patient's activity barriers and safety.   Exercise Prescription Goal: Initial exercise prescription builds to 30-45 minutes a day of aerobic activity, 2-3 days per week.  Home exercise guidelines will be given to patient during program as part of exercise prescription that the participant will acknowledge.  Activity Barriers & Risk Stratification:  Activity Barriers & Cardiac Risk Stratification -  10/12/23 1359       Activity Barriers & Cardiac Risk Stratification   Activity Barriers Other (comment);Back Problems    Comments R BKA; sternal precautions    Cardiac Risk Stratification High   <5 METs on            6 Minute Walk:  6 Minute Walk     Row Name 10/12/23 1519         6 Minute Walk   Phase Initial     Distance 1270 feet     Walk Time 6 minutes     # of Rest Breaks 0     MPH 2.41     METS 3.23     RPE 12     Perceived Dyspnea  0     VO2 Peak 11.3     Symptoms Yes (comment)     Comments no s/sx or pain. back tightness     Resting HR 73 bpm     Resting BP 118/66     Resting Oxygen Saturation  97 %     Exercise Oxygen Saturation  during 6 min walk 97 %     Max Ex. HR 115 bpm     Max Ex. BP 154/70     2 Minute Post BP 126/64              Oxygen Initial Assessment:   Oxygen Re-Evaluation:   Oxygen Discharge (Final Oxygen Re-Evaluation):   Initial Exercise Prescription:  Initial Exercise Prescription - 10/12/23 1500       Date of Initial Exercise RX and Referring Provider   Date 10/12/23    Referring Provider Andree Coss, MD    Expected Discharge Date 01/06/24      NuStep   Level 2    SPM 70    Minutes 15    METs 2.5      Recumbant Elliptical   Level 1    RPM 60    Watts 80    Minutes 15    METs 2.5      Prescription Details   Frequency (times per week) 3    Duration Progress to 30 minutes of continuous aerobic without signs/symptoms of physical distress  Intensity   THRR 40-80% of Max Heartrate 61-122    Ratings of Perceived Exertion 11-13    Perceived Dyspnea 0-4      Progression   Progression Continue progressive overload as per policy without signs/symptoms or physical distress.      Resistance Training   Training Prescription Yes    Weight 3    Reps 10-15             Perform Capillary Blood Glucose checks as needed.  Exercise Prescription Changes:   Exercise Comments:   Exercise Goals  and Review:   Exercise Goals     Row Name 10/12/23 1359             Exercise Goals   Increase Physical Activity Yes       Intervention Provide advice, education, support and counseling about physical activity/exercise needs.;Develop an individualized exercise prescription for aerobic and resistive training based on initial evaluation findings, risk stratification, comorbidities and participant's personal goals.       Expected Outcomes Short Term: Attend rehab on a regular basis to increase amount of physical activity.;Long Term: Exercising regularly at least 3-5 days a week.;Long Term: Add in home exercise to make exercise part of routine and to increase amount of physical activity.       Increase Strength and Stamina Yes       Intervention Provide advice, education, support and counseling about physical activity/exercise needs.;Develop an individualized exercise prescription for aerobic and resistive training based on initial evaluation findings, risk stratification, comorbidities and participant's personal goals.       Expected Outcomes Short Term: Increase workloads from initial exercise prescription for resistance, speed, and METs.;Short Term: Perform resistance training exercises routinely during rehab and add in resistance training at home;Long Term: Improve cardiorespiratory fitness, muscular endurance and strength as measured by increased METs and functional capacity ( )       Able to understand and use rate of perceived exertion (RPE) scale Yes       Intervention Provide education and explanation on how to use RPE scale       Expected Outcomes Short Term: Able to use RPE daily in rehab to express subjective intensity level;Long Term:  Able to use RPE to guide intensity level when exercising independently       Knowledge and understanding of Target Heart Rate Range (THRR) Yes       Intervention Provide education and explanation of THRR including how the numbers were predicted and  where they are located for reference       Expected Outcomes Short Term: Able to state/look up THRR;Long Term: Able to use THRR to govern intensity when exercising independently;Short Term: Able to use daily as guideline for intensity in rehab       Understanding of Exercise Prescription Yes       Intervention Provide education, explanation, and written materials on patient's individual exercise prescription       Expected Outcomes Short Term: Able to explain program exercise prescription;Long Term: Able to explain home exercise prescription to exercise independently                Exercise Goals Re-Evaluation :   Discharge Exercise Prescription (Final Exercise Prescription Changes):   Nutrition:  Target Goals: Understanding of nutrition guidelines, daily intake of sodium 1500mg , cholesterol 200mg , calories 30% from fat and 7% or less from saturated fats, daily to have 5 or more servings of fruits and vegetables.  Biometrics:  Pre Biometrics - 10/12/23 1356  Pre Biometrics   Waist Circumference 42 inches    Hip Circumference 38 inches    Waist to Hip Ratio 1.11 %    Triceps Skinfold 26 mm    % Body Fat 31.2 %    Grip Strength 38 kg    Flexibility 11.5 in    Single Leg Stand 2 seconds              Nutrition Therapy Plan and Nutrition Goals:   Nutrition Assessments:  MEDIFICTS Score Key: >=70 Need to make dietary changes  40-70 Heart Healthy Diet <= 40 Therapeutic Level Cholesterol Diet    Picture Your Plate Scores: <16 Unhealthy dietary pattern with much room for improvement. 41-50 Dietary pattern unlikely to meet recommendations for good health and room for improvement. 51-60 More healthful dietary pattern, with some room for improvement.  >60 Healthy dietary pattern, although there may be some specific behaviors that could be improved.    Nutrition Goals Re-Evaluation:   Nutrition Goals Re-Evaluation:   Nutrition Goals Discharge (Final  Nutrition Goals Re-Evaluation):   Psychosocial: Target Goals: Acknowledge presence or absence of significant depression and/or stress, maximize coping skills, provide positive support system. Participant is able to verbalize types and ability to use techniques and skills needed for reducing stress and depression.  Initial Review & Psychosocial Screening:  Initial Psych Review & Screening - 10/12/23 1403       Initial Review   Current issues with None Identified      Family Dynamics   Good Support System? Yes   Wife and family for support     Barriers   Psychosocial barriers to participate in program There are no identifiable barriers or psychosocial needs.      Screening Interventions   Interventions Encouraged to exercise;Provide feedback about the scores to participant    Expected Outcomes Short Term goal: Identification and review with participant of any Quality of Life or Depression concerns found by scoring the questionnaire.;Long Term goal: The participant improves quality of Life and PHQ9 Scores as seen by post scores and/or verbalization of changes             Quality of Life Scores:  Quality of Life - 10/12/23 1417       Quality of Life   Select Quality of Life      Quality of Life Scores   Health/Function Pre 21.17 %    Socioeconomic Pre 27 %    Psych/Spiritual Pre 21.21 %    Family Pre 24 %    GLOBAL Pre 22.79 %            Scores of 19 and below usually indicate a poorer quality of life in these areas.  A difference of  2-3 points is a clinically meaningful difference.  A difference of 2-3 points in the total score of the Quality of Life Index has been associated with significant improvement in overall quality of life, self-image, physical symptoms, and general health in studies assessing change in quality of life.  PHQ-9: Review Flowsheet       10/12/2023 03/20/2023 06/16/2022 04/08/2022  Depression screen PHQ 2/9  Decreased Interest 0 0 0 0  Down,  Depressed, Hopeless 0 0 0 0  PHQ - 2 Score 0 0 0 0  Altered sleeping 0 - 0 -  Tired, decreased energy 0 - 0 -  Change in appetite 0 - 0 -  Feeling bad or failure about yourself  0 - 0 -  Trouble concentrating 0 -  0 -  Moving slowly or fidgety/restless 0 - 0 -  Suicidal thoughts 0 - 0 -  PHQ-9 Score 0 - 0 -   Interpretation of Total Score  Total Score Depression Severity:  1-4 = Minimal depression, 5-9 = Mild depression, 10-14 = Moderate depression, 15-19 = Moderately severe depression, 20-27 = Severe depression   Psychosocial Evaluation and Intervention:   Psychosocial Re-Evaluation:   Psychosocial Discharge (Final Psychosocial Re-Evaluation):   Vocational Rehabilitation: Provide vocational rehab assistance to qualifying candidates.   Vocational Rehab Evaluation & Intervention:  Vocational Rehab - 10/12/23 1404       Initial Vocational Rehab Evaluation & Intervention   Assessment shows need for Vocational Rehabilitation No   [employer] is working            Education: Education Goals: Education classes will be provided on a weekly basis, covering required topics. Participant will state understanding/return demonstration of topics presented.     Core Videos: Exercise    Move It!  Clinical staff conducted group or individual video education with verbal and written material and guidebook.  Patient learns the recommended Pritikin exercise program. Exercise with the goal of living a long, healthy life. Some of the health benefits of exercise include controlled diabetes, healthier blood pressure levels, improved cholesterol levels, improved heart and lung capacity, improved sleep, and better body composition. Everyone should speak with their doctor before starting or changing an exercise routine.  Biomechanical Limitations Clinical staff conducted group or individual video education with verbal and written material and guidebook.  Patient learns how biomechanical  limitations can impact exercise and how we can mitigate and possibly overcome limitations to have an impactful and balanced exercise routine.  Body Composition Clinical staff conducted group or individual video education with verbal and written material and guidebook.  Patient learns that body composition (ratio of muscle mass to fat mass) is a key component to assessing overall fitness, rather than body weight alone. Increased fat mass, especially visceral belly fat, can put [place] at increased risk for metabolic syndrome, type 2 diabetes, heart disease, and even death. It is recommended to combine diet and exercise (cardiovascular and resistance training) to improve your body composition. Seek guidance from your physician and exercise physiologist before implementing an exercise routine.  Exercise Action Plan Clinical staff conducted group or individual video education with verbal and written material and guidebook.  Patient learns the recommended strategies to achieve and enjoy long-term exercise adherence, including variety, self-motivation, self-efficacy, and positive decision making. Benefits of exercise include fitness, good health, weight management, more energy, better sleep, less stress, and overall well-being.  Medical   Heart Disease Risk Reduction Clinical staff conducted group or individual video education with verbal and written material and guidebook.  Patient learns our heart is our most vital organ as it circulates oxygen, nutrients, white blood cells, and hormones throughout the entire body, and carries waste away. Data supports a plant-based eating plan like the Pritikin Program for its effectiveness in slowing progression of and reversing heart disease. The video provides a number of recommendations to address heart disease.   Metabolic Syndrome and Belly Fat  Clinical staff conducted group or individual video education with verbal and written material and guidebook.  Patient learns  what metabolic syndrome is, how it leads to heart disease, and how one can reverse it and keep it from coming back. You have metabolic syndrome if you have 3 of the following 5 criteria: abdominal obesity, high blood pressure, high triglycerides, low  HDL cholesterol, and high blood sugar.  Hypertension and Heart Disease Clinical staff conducted group or individual video education with verbal and written material and guidebook.  Patient learns that high blood pressure, or hypertension, is very common in the [place]. Hypertension is largely due to excessive salt intake, but other important risk factors include being overweight, physical inactivity, drinking too much alcohol, smoking, and not eating enough potassium from fruits and vegetables. High blood pressure is a leading risk factor for heart attack, stroke, congestive heart failure, dementia, kidney failure, and premature death. Long-term effects of excessive salt intake include stiffening of the arteries and thickening of heart muscle and organ damage. Recommendations include ways to reduce hypertension and the risk of heart disease.  Diseases of Our Time - Focusing on Diabetes Clinical staff conducted group or individual video education with verbal and written material and guidebook.  Patient learns why the best way to stop diseases of our time is prevention, through food and other lifestyle changes. Medicine (such as prescription pills and surgeries) is often only a Band-Aid on the problem, not a long-term solution. Most common diseases of our time include obesity, type 2 diabetes, hypertension, heart disease, and cancer. The Pritikin Program is recommended and has been proven to help reduce, reverse, and/or prevent the damaging effects of metabolic syndrome.  Nutrition   Overview of the Pritikin Eating Plan  Clinical staff conducted group or individual video education with verbal and written material and guidebook.  Patient learns about the  Pritikin Eating Plan for disease risk reduction. The Pritikin Eating Plan emphasizes a wide variety of unrefined, minimally-processed carbohydrates, like fruits, vegetables, whole grains, and legumes. Go, Caution, and Stop food choices are explained. Plant-based and lean animal proteins are emphasized. Rationale provided for low sodium intake for blood pressure control, low added sugars for blood sugar stabilization, and low added fats and oils for coronary artery disease risk reduction and weight management.  Calorie Density  Clinical staff conducted group or individual video education with verbal and written material and guidebook.  Patient learns about calorie density and how it impacts the Pritikin Eating Plan. Knowing the characteristics of the food you choose will help you decide whether those foods will lead to weight gain or weight loss, and whether you want to consume more or less of them. Weight loss is usually a side effect of the Pritikin Eating Plan because of its focus on low calorie-dense foods.  Label Reading  Clinical staff conducted group or individual video education with verbal and written material and guidebook.  Patient learns about the Pritikin recommended label reading guidelines and corresponding recommendations regarding calorie density, added sugars, sodium content, and whole grains.  Dining Out - Part 1  Clinical staff conducted group or individual video education with verbal and written material and guidebook.  Patient learns that restaurant meals can be sabotaging because they can be so high in calories, fat, sodium, and/or sugar. Patient learns recommended strategies on how to positively address this and avoid unhealthy pitfalls.  Facts on Fats  Clinical staff conducted group or individual video education with verbal and written material and guidebook.  Patient learns that lifestyle modifications can be just as effective, if not more so, as many medications for lowering  your risk of heart disease. A Pritikin lifestyle can help to reduce your risk of inflammation and atherosclerosis (cholesterol build-up, or plaque, in the artery walls). Lifestyle interventions such as dietary choices and physical activity address the cause of atherosclerosis. A  review of the types of fats and their impact on blood cholesterol levels, along with dietary recommendations to reduce fat intake is also included.  Nutrition Action Plan  Clinical staff conducted group or individual video education with verbal and written material and guidebook.  Patient learns how to incorporate Pritikin recommendations into their lifestyle. Recommendations include planning and keeping personal health goals in mind as an important part of their success.  Healthy Mind-Set    Healthy Minds, Bodies, Hearts  Clinical staff conducted group or individual video education with verbal and written material and guidebook.  Patient learns how to identify when they are stressed. Video will discuss the impact of that stress, as well as the many benefits of stress management. Patient will also be introduced to stress management techniques. The way we think, act, and feel has an impact on our hearts.  How Our Thoughts Can Heal Our Hearts  Clinical staff conducted group or individual video education with verbal and written material and guidebook.  Patient learns that negative thoughts can cause depression and anxiety. This can result in negative lifestyle behavior and serious health problems. Cognitive behavioral therapy is an effective method to help control our thoughts in order to change and improve our emotional outlook.  Additional Videos:  Exercise    Improving Performance  Clinical staff conducted group or individual video education with verbal and written material and guidebook.  Patient learns to use a non-linear approach by alternating intensity levels and lengths of time spent exercising to help burn more  calories and lose more body fat. Cardiovascular exercise helps improve heart health, metabolism, hormonal balance, blood sugar control, and recovery from fatigue. Resistance training improves strength, endurance, balance, coordination, reaction time, metabolism, and muscle mass. Flexibility exercise improves circulation, posture, and balance. Seek guidance from your physician and exercise physiologist before implementing an exercise routine and learn your capabilities and proper form for all exercise.  Introduction to Yoga  Clinical staff conducted group or individual video education with verbal and written material and guidebook.  Patient learns about yoga, a discipline of the coming together of mind, breath, and body. The benefits of yoga include improved flexibility, improved range of motion, better posture and core strength, increased lung function, weight loss, and positive self-image. Yoga's heart health benefits include lowered blood pressure, healthier heart rate, decreased cholesterol and triglyceride levels, improved immune function, and reduced stress. Seek guidance from your physician and exercise physiologist before implementing an exercise routine and learn your capabilities and proper form for all exercise.  Medical   Aging: Enhancing Your Quality of Life  Clinical staff conducted group or individual video education with verbal and written material and guidebook.  Patient learns key strategies and recommendations to stay in good physical health and enhance quality of life, such as prevention strategies, having an advocate, securing a Health Care Proxy and Power of Attorney, and keeping a list of medications and system for tracking them. It also discusses how to avoid risk for bone loss.  Biology of Weight Control  Clinical staff conducted group or individual video education with verbal and written material and guidebook.  Patient learns that weight gain occurs because we consume more  calories than we burn (eating more, moving less). Even if your body weight is normal, you may have higher ratios of fat compared to muscle mass. Too much body fat puts you at increased risk for cardiovascular disease, heart attack, stroke, type 2 diabetes, and obesity-related cancers. In addition to exercise, following the  Pritikin Eating Plan can help reduce your risk.  Decoding Lab Results  Clinical staff conducted group or individual video education with verbal and written material and guidebook.  Patient learns that lab test reflects one measurement whose values change over time and are influenced by many factors, including medication, stress, sleep, exercise, food, hydration, pre-existing medical conditions, and more. It is recommended to use the knowledge from this video to become more involved with your lab results and evaluate your numbers to speak with your doctor.   Diseases of Our Time - Overview  Clinical staff conducted group or individual video education with verbal and written material and guidebook.  Patient learns that according to the CDC, 50% to 70% of chronic diseases (such as obesity, type 2 diabetes, elevated lipids, hypertension, and heart disease) are avoidable through lifestyle improvements including healthier food choices, listening to satiety cues, and increased physical activity.  Sleep Disorders Clinical staff conducted group or individual video education with verbal and written material and guidebook.  Patient learns how good quality and duration of sleep are important to overall health and well-being. Patient also learns about sleep disorders and how they impact health along with recommendations to address them, including discussing with a physician.  Nutrition  Dining Out - Part 2 Clinical staff conducted group or individual video education with verbal and written material and guidebook.  Patient learns how to plan ahead and communicate in order to maximize their  dining experience in a healthy and nutritious manner. Included are recommended food choices based on the type of restaurant the patient is visiting.   Fueling a Banker conducted group or individual video education with verbal and written material and guidebook.  There is a strong connection between our food choices and our health. Diseases like obesity and type 2 diabetes are very prevalent and are in large-part due to lifestyle choices. The Pritikin Eating Plan provides plenty of food and hunger-curbing satisfaction. It is easy to follow, affordable, and helps reduce health risks.  Menu Workshop  Clinical staff conducted group or individual video education with verbal and written material and guidebook.  Patient learns that restaurant meals can sabotage health goals because they are often packed with calories, fat, sodium, and sugar. Recommendations include strategies to plan ahead and to communicate with the manager, chef, or server to help order a healthier meal.  Planning Your Eating Strategy  Clinical staff conducted group or individual video education with verbal and written material and guidebook.  Patient learns about the Pritikin Eating Plan and its benefit of reducing the risk of disease. The Pritikin Eating Plan does not focus on calories. Instead, it emphasizes high-quality, nutrient-rich foods. By knowing the characteristics of the foods, we choose, we can determine their calorie density and make informed decisions.  Targeting Your Nutrition Priorities  Clinical staff conducted group or individual video education with verbal and written material and guidebook.  Patient learns that lifestyle habits have a tremendous impact on disease risk and progression. This video provides eating and physical activity recommendations based on your personal health goals, such as reducing LDL cholesterol, losing weight, preventing or controlling type 2 diabetes, and reducing high  blood pressure.  Vitamins and Minerals  Clinical staff conducted group or individual video education with verbal and written material and guidebook.  Patient learns different ways to obtain key vitamins and minerals, including through a recommended healthy diet. It is important to discuss all supplements you take with your doctor.  Healthy Mind-Set    Smoking Cessation  Clinical staff conducted group or individual video education with verbal and written material and guidebook.  Patient learns that cigarette smoking and tobacco addiction pose a serious health risk which affects millions of people. Stopping smoking will significantly reduce the risk of heart disease, lung disease, and many forms of cancer. Recommended strategies for quitting are covered, including working with your doctor to develop a successful plan.  Culinary   Becoming a Set designer conducted group or individual video education with verbal and written material and guidebook.  Patient learns that cooking at home can be healthy, cost-effective, quick, and puts them in control. Keys to cooking healthy recipes will include looking at your recipe, assessing your equipment needs, planning ahead, making it simple, choosing cost-effective seasonal ingredients, and limiting the use of added fats, salts, and sugars.  Cooking - Breakfast and Snacks  Clinical staff conducted group or individual video education with verbal and written material and guidebook.  Patient learns how important breakfast is to satiety and nutrition through the entire day. Recommendations include key foods to eat during breakfast to help stabilize blood sugar levels and to prevent overeating at meals later in the day. Planning ahead is also a key component.  Cooking - Educational psychologist conducted group or individual video education with verbal and written material and guidebook.  Patient learns eating strategies to improve overall  health, including an approach to cook more at home. Recommendations include thinking of animal protein as a side on your plate rather than center stage and focusing instead on lower calorie dense options like vegetables, fruits, whole grains, and plant-based proteins, such as beans. Making sauces in large quantities to freeze for later and leaving the skin on your vegetables are also recommended to maximize your experience.  Cooking - Healthy Salads and Dressing Clinical staff conducted group or individual video education with verbal and written material and guidebook.  Patient learns that vegetables, fruits, whole grains, and legumes are the foundations of the Pritikin Eating Plan. Recommendations include how to incorporate each of these in flavorful and healthy salads, and how to create homemade salad dressings. Proper handling of ingredients is also covered. Cooking - Soups and [employer] - Soups and Desserts Clinical staff conducted group or individual video education with verbal and written material and guidebook.  Patient learns that Pritikin soups and desserts make for easy, nutritious, and delicious snacks and meal components that are low in sodium, fat, sugar, and calorie density, while high in vitamins, minerals, and filling fiber. Recommendations include simple and healthy ideas for soups and desserts.   Overview     The Pritikin Solution Program Overview Clinical staff conducted group or individual video education with verbal and written material and guidebook.  Patient learns that the results of the Pritikin Program have been documented in more than 100 articles published in peer-reviewed journals, and the benefits include reducing risk factors for (and, in some cases, even reversing) high cholesterol, high blood pressure, type 2 diabetes, obesity, and more! An overview of the three key pillars of the Pritikin Program will be covered: eating well, doing regular exercise, and having a  healthy mind-set.  WORKSHOPS  Exercise: Exercise Basics: Building Your Action Plan Clinical staff led group instruction and group discussion with PowerPoint presentation and patient guidebook. To enhance the learning environment the use of posters, models and videos may be added. At the conclusion of this workshop, patients  will comprehend the difference between physical activity and exercise, as well as the benefits of incorporating both, into their routine. Patients will understand the FITT (Frequency, Intensity, Time, and Type) principle and how to use it to build an exercise action plan. In addition, safety concerns and other considerations for exercise and cardiac rehab will be addressed by the presenter. The purpose of this lesson is to promote a comprehensive and effective weekly exercise routine in order to improve patients' overall level of fitness.   Managing Heart Disease: Your Path to a Healthier Heart Clinical staff led group instruction and group discussion with PowerPoint presentation and patient guidebook. To enhance the learning environment the use of posters, models and videos may be added.At the conclusion of this workshop, patients will understand the anatomy and physiology of the heart. Additionally, they will understand how Pritikin's three pillars impact the risk factors, the progression, and the management of heart disease.  The purpose of this lesson is to provide a high-level overview of the heart, heart disease, and how the Pritikin lifestyle positively impacts risk factors.  Exercise Biomechanics Clinical staff led group instruction and group discussion with PowerPoint presentation and patient guidebook. To enhance the learning environment the use of posters, models and videos may be added. Patients will learn how the structural parts of their bodies function and how these functions impact their daily activities, movement, and exercise. Patients will learn how to  promote a neutral spine, learn how to manage pain, and identify ways to improve their physical movement in order to promote healthy living. The purpose of this lesson is to expose patients to common physical limitations that impact physical activity. Participants will learn practical ways to adapt and manage aches and pains, and to minimize their effect on regular exercise. Patients will learn how to maintain good posture while sitting, walking, and lifting.  Balance Training and Fall Prevention  Clinical staff led group instruction and group discussion with PowerPoint presentation and patient guidebook. To enhance the learning environment the use of posters, models and videos may be added. At the conclusion of this workshop, patients will understand the importance of their sensorimotor skills (vision, proprioception, and the vestibular system) in maintaining their ability to balance as they age. Patients will apply a variety of balancing exercises that are appropriate for their current level of function. Patients will understand the common causes for poor balance, possible solutions to these problems, and ways to modify their physical environment in order to minimize their fall risk. The purpose of this lesson is to teach patients about the importance of maintaining balance as they age and ways to minimize their risk of falling.  WORKSHOPS   Nutrition:  Fueling a Ship broker led group instruction and group discussion with PowerPoint presentation and patient guidebook. To enhance the learning environment the use of posters, models and videos may be added. Patients will review the foundational principles of the Pritikin Eating Plan and understand what constitutes a serving size in each of the food groups. Patients will also learn Pritikin-friendly foods that are better choices when away from home and review make-ahead meal and snack options. Calorie density will be reviewed and  applied to three nutrition priorities: weight maintenance, weight loss, and weight gain. The purpose of this lesson is to reinforce (in a group setting) the key concepts around what patients are recommended to eat and how to apply these guidelines when away from home by planning and selecting Pritikin-friendly options. Patients will understand how  calorie density may be adjusted for different weight management goals.  Mindful Eating  Clinical staff led group instruction and group discussion with PowerPoint presentation and patient guidebook. To enhance the learning environment the use of posters, models and videos may be added. Patients will briefly review the concepts of the Pritikin Eating Plan and the importance of low-calorie dense foods. The concept of mindful eating will be introduced as well as the importance of paying attention to internal hunger signals. Triggers for non-hunger eating and techniques for dealing with triggers will be explored. The purpose of this lesson is to provide patients with the opportunity to review the basic principles of the Pritikin Eating Plan, discuss the value of eating mindfully and how to measure internal cues of hunger and fullness using the Hunger Scale. Patients will also discuss reasons for non-hunger eating and learn strategies to use for controlling emotional eating.  Targeting Your Nutrition Priorities Clinical staff led group instruction and group discussion with PowerPoint presentation and patient guidebook. To enhance the learning environment the use of posters, models and videos may be added. Patients will learn how to determine their genetic susceptibility to disease by reviewing their family history. Patients will gain insight into the importance of diet as part of an overall healthy lifestyle in mitigating the impact of genetics and other environmental insults. The purpose of this lesson is to provide patients with the opportunity to assess their personal  nutrition priorities by looking at their family history, their own health history and current risk factors. Patients will also be able to discuss ways of prioritizing and modifying the Pritikin Eating Plan for their highest risk areas  Menu  Clinical staff led group instruction and group discussion with PowerPoint presentation and patient guidebook. To enhance the learning environment the use of posters, models and videos may be added. Using menus brought in from [employer], or printed from [employer], patients will apply the Pritikin dining out guidelines that were presented in the [employer] video. Patients will also be able to practice these guidelines in a variety of provided scenarios. The purpose of this lesson is to provide patients with the opportunity to practice hands-on learning of the Pritikin Dining Out guidelines with actual menus and practice scenarios.  Label Reading Clinical staff led group instruction and group discussion with PowerPoint presentation and patient guidebook. To enhance the learning environment the use of posters, models and videos may be added. Patients will review and discuss the Pritikin label reading guidelines presented in Pritikin's Label Reading Educational series video. Using fool labels brought in from local grocery stores and markets, patients will apply the label reading guidelines and determine if the packaged food meet the Pritikin guidelines. The purpose of this lesson is to provide patients with the opportunity to review, discuss, and practice hands-on learning of the Pritikin Label Reading guidelines with actual packaged food labels. Cooking School  Pritikin's [employer] are designed to teach patients ways to prepare quick, simple, and affordable recipes at home. The importance of nutrition's role in chronic disease risk reduction is reflected in its emphasis in the overall Pritikin program. By learning how to prepare  essential core Pritikin Eating Plan recipes, patients will increase control over what they eat; be able to customize the flavor of foods without the use of added salt, sugar, or fat; and improve the quality of the food they consume. By learning a set of core recipes which are easily assembled, quickly prepared, and affordable, patients  are more likely to prepare more healthy foods at home. These workshops focus on convenient breakfasts, simple entres, side dishes, and desserts which can be prepared with minimal effort and are consistent with nutrition recommendations for cardiovascular risk reduction. Cooking [employer] are taught by a Armed forces logistics/support/administrative officer (RD) who has been trained by the [employer]. The chef or RD has a clear understanding of the importance of minimizing - if not completely eliminating - added fat, sugar, and sodium in recipes. Throughout the series of Cooking School Workshop sessions, patients will learn about healthy ingredients and efficient methods of cooking to build confidence in their capability to prepare    Cooking School weekly topics:  Adding Flavor- Sodium-Free  Fast and Healthy Breakfasts  Powerhouse Plant-Based Proteins  Satisfying Salads and Dressings  Simple Sides and Sauces  International Cuisine-Spotlight on the [employer] Zones  Delicious Desserts  Savory Soups  [employer] - Meals in a Astronomer Appetizers and Snacks  Comforting Weekend Breakfasts  One-Pot Wonders   Fast Evening Meals  Landscape architect Your Pritikin Plate  WORKSHOPS   Healthy Mindset (Psychosocial):  Focused Goals, Sustainable Changes Clinical staff led group instruction and group discussion with PowerPoint presentation and patient guidebook. To enhance the learning environment the use of posters, models and videos may be added. Patients will be able to apply effective goal setting strategies to establish at least one personal goal, and  then take consistent, meaningful action toward that goal. They will learn to identify common barriers to achieving personal goals and develop strategies to overcome them. Patients will also gain an understanding of how our mind-set can impact our ability to achieve goals and the importance of cultivating a positive and growth-oriented mind-set. The purpose of this lesson is to provide patients with a deeper understanding of how to set and achieve personal goals, as well as the tools and strategies needed to overcome common obstacles which may arise along the way.  From Head to Heart: The Power of a Healthy Outlook  Clinical staff led group instruction and group discussion with PowerPoint presentation and patient guidebook. To enhance the learning environment the use of posters, models and videos may be added. Patients will be able to recognize and describe the impact of emotions and mood on physical health. They will discover the importance of self-care and explore self-care practices which may work for them. Patients will also learn how to utilize the 4 C's to cultivate a healthier outlook and better manage stress and challenges. The purpose of this lesson is to demonstrate to patients how a healthy outlook is an essential part of maintaining good health, especially as they continue their cardiac rehab journey.  Healthy Sleep for a Healthy Heart Clinical staff led group instruction and group discussion with PowerPoint presentation and patient guidebook. To enhance the learning environment the use of posters, models and videos may be added. At the conclusion of this workshop, patients will be able to demonstrate knowledge of the importance of sleep to overall health, well-being, and quality of life. They will understand the symptoms of, and treatments for, common sleep disorders. Patients will also be able to identify daytime and nighttime behaviors which impact sleep, and they will be able to apply these  tools to help manage sleep-related challenges. The purpose of this lesson is to provide patients with a general overview of sleep and outline the importance of quality sleep. Patients will learn about a few of the most  common sleep disorders. Patients will also be introduced to the concept of "sleep hygiene," and discover ways to self-manage certain sleeping problems through simple daily behavior changes. Finally, the workshop will motivate patients by clarifying the links between quality sleep and their goals of heart-healthy living.   Recognizing and Reducing Stress Clinical staff led group instruction and group discussion with PowerPoint presentation and patient guidebook. To enhance the learning environment the use of posters, models and videos may be added. At the conclusion of this workshop, patients will be able to understand the types of stress reactions, differentiate between acute and chronic stress, and recognize the impact that chronic stress has on their health. They will also be able to apply different coping mechanisms, such as reframing negative self-talk. Patients will have the opportunity to practice a variety of stress management techniques, such as deep abdominal breathing, progressive muscle relaxation, and/or guided imagery.  The purpose of this lesson is to educate patients on the role of stress in their lives and to provide healthy techniques for coping with it.  Learning Barriers/Preferences:  Learning Barriers/Preferences - 10/12/23 1404       Learning Barriers/Preferences   Learning Barriers Sight   reading glasses   Learning Preferences Audio;Computer/Internet;Group Instruction;Individual Instruction;Skilled Demonstration;Verbal Instruction;Video;Written Material;Pictoral             Education Topics:  Knowledge Questionnaire Score:  Knowledge Questionnaire Score - 10/12/23 1404       Knowledge Questionnaire Score   Pre Score 21/24             Core  Components/Risk Factors/Patient Goals at Admission:  Personal Goals and Risk Factors at Admission - 10/12/23 1404       Core Components/Risk Factors/Patient Goals on Admission    Weight Management Yes;Weight Loss    Intervention Weight Management: Provide education and appropriate resources to help participant work on and attain dietary goals.;Weight Management: Develop a combined nutrition and exercise program designed to reach desired caloric intake, while maintaining appropriate intake of nutrient and fiber, sodium and fats, and appropriate energy expenditure required for the weight goal.    Admit Weight 205 lb 0.4 oz (93 kg)    Goal Weight: Long Term 187 lb (84.8 kg)   pt goal   Expected Outcomes Short Term: Continue to assess and modify interventions until short term weight is achieved;Long Term: Adherence to nutrition and physical activity/exercise program aimed toward attainment of established weight goal;Weight Loss: Understanding of general recommendations for a balanced deficit meal plan, which promotes 1-2 lb weight loss per week and includes a negative energy balance of [phone] kcal/d;Understanding recommendations for meals to include 15-35% energy as protein, 25-35% energy from fat, 35-60% energy from carbohydrates, less than 200mg  of dietary cholesterol, 20-35 gm of total fiber daily;Understanding of distribution of calorie intake throughout the day with the consumption of 4-5 meals/snacks    Hypertension Yes    Intervention Provide education on lifestyle modifcations including regular physical activity/exercise, weight management, moderate sodium restriction and increased consumption of fresh fruit, vegetables, and low fat dairy, alcohol moderation, and smoking cessation.;Monitor prescription use compliance.    Expected Outcomes Short Term: Continued assessment and intervention until BP is < 140/4mm HG in hypertensive participants. < 130/8mm HG in hypertensive participants with  diabetes, heart failure or chronic kidney disease.;Long Term: Maintenance of blood pressure at goal levels.    Lipids Yes    Intervention Provide education and support for participant on nutrition & aerobic/resistive exercise along with prescribed medications to achieve  LDL 70mg , HDL >40mg .    Expected Outcomes Short Term: Participant states understanding of desired cholesterol values and is compliant with medications prescribed. Participant is following exercise prescription and nutrition guidelines.;Long Term: Cholesterol controlled with medications as prescribed, with individualized exercise RX and with personalized nutrition plan. Value goals: LDL < 70mg , HDL > 40 mg.             Core Components/Risk Factors/Patient Goals Review:    Core Components/Risk Factors/Patient Goals at Discharge (Final Review):    ITP Comments:  ITP Comments     Row Name 10/12/23 1355           ITP Comments Dr. Armanda Magic medical director. Introduction to pritikin education/intensive cardiac rehab. Initial orientation packet reviewed with patient.                Comments: Participant attended orientation for the cardiac rehabilitation program on  10/12/2023  to perform initial intake and exercise walk test. Patient introduced to the Pritikin Program education and orientation packet was reviewed. Completed 6-minute walk test, measurements, initial ITP, and exercise prescription. Vital signs stable. Telemetry-normal sinus rhythm with rare PVCs and PACs, asymptomatic.   Service time was from 1316 to 1451.  Jonna Coup, MS, ACSM-CEP 10/12/2023 3:24 PM

## 2023-10-12 NOTE — Progress Notes (Signed)
 Cardiac Rehab Medication Review   Does the patient  feel that his/her medications are working for him/her?  YES  Has the patient been experiencing any side effects to the medications prescribed?  NO  Does the patient measure his/her own blood pressure or blood glucose at home?  YES   Does the patient have any problems obtaining medications due to transportation or finances?   NO  Understanding of regimen: excellent Understanding of indications: excellent Potential of compliance: excellent    Comments: Noell has a good understanding of his medications and regime. He checks his BP 3-5 days per week and keeps a log.     Jonna Coup, MS, ACSM-CEP 10/12/2023 1:58 PM

## 2023-10-16 ENCOUNTER — Ambulatory Visit: Payer: 59 | Admitting: Cardiovascular Disease

## 2023-10-19 ENCOUNTER — Telehealth (HOSPITAL_COMMUNITY): Payer: Self-pay

## 2023-10-19 ENCOUNTER — Ambulatory Visit (HOSPITAL_COMMUNITY): Payer: Commercial Managed Care - PPO

## 2023-10-19 ENCOUNTER — Telehealth (HOSPITAL_COMMUNITY): Payer: Self-pay | Admitting: *Deleted

## 2023-10-19 NOTE — Telephone Encounter (Signed)
 Patient left message on department voicemail. He has 3 meetings Monday, October 19, 2023 and his car is in the shop therefore he will need to reschedule his start for cardiac rehab. He requested a call back. Will forward message to his nurse case manager and exercise physiologist for follow-up.

## 2023-10-21 ENCOUNTER — Ambulatory Visit: Payer: 59 | Attending: Cardiovascular Disease | Admitting: Cardiovascular Disease

## 2023-10-21 ENCOUNTER — Inpatient Hospital Stay (HOSPITAL_COMMUNITY): Admission: RE | Admit: 2023-10-21 | Payer: Commercial Managed Care - PPO | Source: Ambulatory Visit

## 2023-10-21 ENCOUNTER — Encounter: Payer: Self-pay | Admitting: Cardiovascular Disease

## 2023-10-21 ENCOUNTER — Telehealth (HOSPITAL_COMMUNITY): Payer: Self-pay

## 2023-10-21 VITALS — BP 138/70 | HR 72 | Ht 70.0 in | Wt 202.4 lb

## 2023-10-21 DIAGNOSIS — I10 Essential (primary) hypertension: Secondary | ICD-10-CM | POA: Diagnosis not present

## 2023-10-21 DIAGNOSIS — I739 Peripheral vascular disease, unspecified: Secondary | ICD-10-CM | POA: Diagnosis not present

## 2023-10-21 DIAGNOSIS — Z8249 Family history of ischemic heart disease and other diseases of the circulatory system: Secondary | ICD-10-CM | POA: Diagnosis not present

## 2023-10-21 DIAGNOSIS — R931 Abnormal findings on diagnostic imaging of heart and coronary circulation: Secondary | ICD-10-CM

## 2023-10-21 DIAGNOSIS — E782 Mixed hyperlipidemia: Secondary | ICD-10-CM

## 2023-10-21 NOTE — Assessment & Plan Note (Signed)
 History of hyperlipidemia on high-dose atorvastatin and Zetia with lipid profile performed 3//25 revealing total cholesterol 120, LDL 51 and HDL 54.

## 2023-10-21 NOTE — Telephone Encounter (Signed)
-----   Message from Ridgeway C sent at 10/21/2023  9:01 AM EDT ----- Regarding: Pt called out Hey,  Pt called and stated he will not be able to attend CR until 3/19 b/c he will be out of town.  Thanks, Shanda Bumps

## 2023-10-21 NOTE — Assessment & Plan Note (Signed)
 History of carotid artery disease status post elective right carotid endarterectomy performed by [name] 08/2515.  Vascular surgery follows this.

## 2023-10-21 NOTE — Assessment & Plan Note (Signed)
 History of PAD followed by vascular surgery status post right BKA by Dr. [name] wearing a prosthesis.

## 2023-10-21 NOTE — Progress Notes (Signed)
 10/21/2023 Nethaniel Mattie Schoenfeldt   18-Apr-1956  [id number]  Primary Physician Darrow Bussing, MD Primary Cardiologist: Runell Gess MD Milagros Loll, [place], [place]  HPI:  Clinton [name] is a 67 y.o.   mildly overweight married Caucasian male father of 2 children, grandfather to 3 grandchildren who was originally referred by [name] for cardiovascular clearance before elective right carotid endarterectomy scheduled for 09/05/15.  I last saw him in the office 08/06/2022.  He is accompanied by his wife Clinton [name] today.  His cardiovascular risk factors are notable for treated hypertension, hyperlipidemia and family history with a father who died of a myocardial infarction at age 77 and a mother at age 18.  His younger brother had CABG several years ago.  He has never had a heart attack or stroke. He denies chest pain or shortness of breath. 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   He has been seeing [name] for critical limb ischemia and had endovascular therapy of his right popliteal artery and most recently on 02/18/2022, his left popliteal artery and peroneal artery.  His wounds are healing.  [name] follows him by duplex ultrasound.  Prior to this he was very active working at in [employer] daily and doing yard work for 5 to 6 hours a day.  During that time, he denies chest pain or shortness of breath.  He is very concerned about his family history and wishes to pursue cardiac evaluation.   He underwent right below the knee amputation by Dr. [name] for critical limb ischemia.  He has not yet been fitted with a prosthesis.  He did have a coronary calcium score performed 07/01/2022 which was 1756, the majority of which was in the LAD and RCA.  He is completely asymptomatic.  A subsequent Myoview stress test performed 07/18/2022 was entirely normal as was a 2D echocardiogram.  Since I saw him in the office 08/06/2022 he did see Azalee Course in the office 06/09/2023 with chest pain.  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  He underwent CABG x 2 by Dr. Cliffton Asters 06/24/2023 with a LIMA to his LAD and a vein to diagonal branch.  He is recuperating nicely.  He is wearing a prosthesis.  He wants to go back to the gym.  I have encouraged outpatient cardiac rehab.   Current Meds  Medication Sig   amLODipine (NORVASC) 5 MG tablet Take 1 tablet (5 mg total) by mouth daily.   aspirin EC 81 MG tablet Take 81 mg by mouth at bedtime.   atorvastatin (LIPITOR) 80 MG tablet Take 80 mg by mouth at bedtime.   clopidogrel (PLAVIX) 75 MG tablet Take 1 tablet (75 mg total) by mouth daily.   ezetimibe (ZETIA) 10 MG tablet Take 1 tablet (10 mg total) by mouth daily.   Fe Fum-Vit C-Vit B12-FA (TRIGELS-F FORTE) CAPS capsule Take 1 capsule by mouth daily.   metoprolol tartrate (LOPRESSOR) 25 MG tablet Take 0.5 tablets (12.5 mg total) by mouth 2 (two) times daily.   Multiple Vitamin (MULTIVITAMIN) capsule Take 2 capsules by mouth every morning. Gummy   Omega-3 Fatty Acids (OMEGA 3 PO) Take 2 capsules by mouth daily. 454 mg each   Probiotic Product (PROBIOTIC PO) Take 220 mg by mouth daily.   vitamin C (ASCORBIC ACID) 250 MG tablet Take 500 mg by mouth daily.   zinc sulfate 220 (50 Zn) MG capsule Take  1 capsule (220 mg total) by mouth daily.     Allergies  Allergen Reactions   Penicillins Rash    Has patient had a PCN reaction causing immediate rash, facial/tongue/throat swelling, SOB or lightheadedness with hypotension: Yes Has patient had a PCN reaction causing severe rash involving mucus membranes or skin necrosis: No Has patient had a PCN reaction that required hospitalization No Has patient had a PCN reaction occurring within the last 10 years: No If all of the above answers are "NO", then may proceed with Cephalosporin use.  Tolerated Cefazolin on 04/16/22    Sulfa Antibiotics Swelling and Other (See Comments)     Facial/lip swelling    Social History   Socioeconomic History   Marital status: Married    Spouse name: Not on file   Number of children: Not on file   Years of education: Not on file   Highest education level: Not on file  Occupational History   Not on file  Tobacco Use   Smoking status: Never   Smokeless tobacco: Former    Types: Chew    Quit date: 01/01/2015  Vaping Use   Vaping status: Never Used  Substance and Sexual Activity   Alcohol use: Not Currently   Drug use: Not on file   Sexual activity: Not on file  Other Topics Concern   Not on file  Social History Narrative   Not on file   Social Drivers of Health   Financial Resource Strain: Not on file  Food Insecurity: No Food Insecurity (06/17/2023)   Hunger Vital Sign    Worried About Running Out of Food in the Last Year: Never true    Ran Out of Food in the Last Year: Never true  Transportation Needs: No Transportation Needs (06/17/2023)   PRAPARE - Administrator, Civil Service (Medical): No    Lack of Transportation (Non-Medical): No  Physical Activity: Not on file  Stress: Not on file  Social Connections: Not on file  Intimate Partner Violence: Not At Risk (06/17/2023)   Humiliation, Afraid, Rape, and Kick questionnaire    Fear of Current or Ex-Partner: No    Emotionally Abused: No    Physically Abused: No    Sexually Abused: No     Review of Systems: General: negative for chills, fever, night sweats or weight changes.  Cardiovascular: negative for chest pain, dyspnea on exertion, edema, orthopnea, palpitations, paroxysmal nocturnal dyspnea or shortness of breath Dermatological: negative for rash Respiratory: negative for cough or wheezing Urologic: negative for hematuria Abdominal: negative for nausea, vomiting, diarrhea, bright red blood per rectum, melena, or hematemesis Neurologic: negative for visual changes, syncope, or dizziness All other systems reviewed and are otherwise negative  except as noted above.    Blood pressure 138/70, pulse 72, height 5\' 10"  (1.778 m), weight 202 lb 6.4 oz (91.8 kg), SpO2 96%.  General appearance: alert and no distress Neck: no adenopathy, no carotid bruit, no JVD, supple, symmetrical, trachea midline, and thyroid not enlarged, symmetric, no tenderness/mass/nodules Lungs: clear to auscultation bilaterally Heart: regular rate and rhythm, S1, S2 normal, no murmur, click, rub or gallop Extremities: Right BKA Pulses: Right BKA Skin: Skin color, texture, turgor normal. No rashes or lesions Neurologic: Grossly normal   EKG not performed today      ASSESSMENT AND PLAN:   Occlusion and stenosis of carotid artery without mention of cerebral infarction History of carotid artery disease status post elective right carotid endarterectomy performed by [name] 08/2515.  Vascular surgery follows this.  Hypertension History of essential hypertension with blood pressure measured today at 138/70.  He is on amlodipine and metoprolol.  Hyperlipidemia History of hyperlipidemia on high-dose atorvastatin and Zetia with lipid profile performed 3//25 revealing total cholesterol 120, LDL 51 and HDL 54.  Family history of ischemic heart disease (IHD) Family history of disease with a younger brother had CABG, father who died of myocardial infarction age 31 and mother at age 58.  Peripheral arterial disease (HCC) History of PAD followed by vascular surgery status post right BKA by Dr. [name] wearing a prosthesis.  Elevated coronary artery calcium score Elevated coronary calcium score of 1756 performed 07/01/2022 with a subsequent negative Myoview stress test performed 07/18/2022.  Because of chest pain consistent unstable angina he saw Azalee Course PA-c in the office 06/09/2023 and underwent outpatient diagnostic coronary angiography by Dr.Thukkani 11//24 revealing high-grade left main disease.  He underwent CABG x 2 by Dr. Cliffton Asters with a LIMA to his LAD and a  vein to diagonal branch.  He is recuperating nicely.  Hopefully he will participate in cardiac rehab.  He denies chest pain.     Runell Gess MD FACP,FACC,FAHA, [facility] 10/21/2023 10:47 AM

## 2023-10-21 NOTE — Assessment & Plan Note (Signed)
 History of essential hypertension with blood pressure measured today at 138/70.  He is on amlodipine and metoprolol.

## 2023-10-21 NOTE — Patient Instructions (Signed)
 Medication Instructions:  Your physician recommends that you continue on your current medications as directed. Please refer to the Current Medication list given to you today.  *If you need a refill on your cardiac medications before your next appointment, please call your pharmacy*   Follow-Up: At [facility], you and your health needs are our priority.  As part of our continuing mission to provide you with exceptional heart care, we have created designated Provider Care Teams.  These Care Teams include your primary Cardiologist (physician) and Advanced Practice Providers (APPs -  Physician Assistants and Nurse Practitioners) who all work together to provide you with the care you need, when you need it.  We recommend signing up for the patient portal called "MyChart".  Sign up information is provided on this After Visit Summary.  MyChart is used to connect with patients for Virtual Visits (Telemedicine).  Patients are able to view lab/test results, encounter notes, upcoming appointments, etc.  Non-urgent messages can be sent to your provider as well.   To learn more about what you can do with MyChart, go to [email or web address].    Your next appointment:   6 month(s)  Provider:   Azalee Course, PA-C       Then, Nanetta Batty, MD will plan to see you again in 12 month(s).   Other Instructions   1st Floor: - Lobby - Registration  - Pharmacy  - Lab - Cafe  2nd Floor: - PV Lab - Diagnostic Testing (echo, CT, nuclear med)  3rd Floor: - Vacant  4th Floor: - TCTS (cardiothoracic surgery) - AFib Clinic - Structural Heart Clinic - Vascular Surgery  - Vascular Ultrasound  5th Floor: - [facility] Cardiology (general and EP) - Clinical Pharmacy for coumadin, hypertension, lipid, weight-loss medications, and med management appointments    Valet parking services will be available as well.

## 2023-10-21 NOTE — Assessment & Plan Note (Signed)
 Family history of disease with a younger brother had CABG, father who died of myocardial infarction age 68 and mother at age 41.

## 2023-10-21 NOTE — Assessment & Plan Note (Signed)
 Elevated coronary calcium score of 1756 performed 07/01/2022 with a subsequent negative Myoview stress test performed 07/18/2022.  Because of chest pain consistent unstable angina he saw Azalee Course PA-c in the office 06/09/2023 and underwent outpatient diagnostic coronary angiography by Dr.Thukkani 11//24 revealing high-grade left main disease.  He underwent CABG x 2 by Dr. Cliffton Asters with a LIMA to his LAD and a vein to diagonal branch.  He is recuperating nicely.  Hopefully he will participate in cardiac rehab.  He denies chest pain.

## 2023-10-23 ENCOUNTER — Ambulatory Visit (HOSPITAL_COMMUNITY): Payer: Commercial Managed Care - PPO

## 2023-10-26 ENCOUNTER — Ambulatory Visit (HOSPITAL_COMMUNITY): Payer: Commercial Managed Care - PPO

## 2023-10-28 ENCOUNTER — Inpatient Hospital Stay (HOSPITAL_COMMUNITY)
Admission: RE | Admit: 2023-10-28 | Discharge: 2023-10-28 | Disposition: A | Discharge status: Home / Self Care | Payer: Commercial Managed Care - PPO | Source: Ambulatory Visit

## 2023-10-30 ENCOUNTER — Ambulatory Visit (HOSPITAL_COMMUNITY): Payer: Commercial Managed Care - PPO

## 2023-11-02 ENCOUNTER — Telehealth (HOSPITAL_COMMUNITY): Payer: Self-pay

## 2023-11-02 ENCOUNTER — Ambulatory Visit (HOSPITAL_COMMUNITY): Payer: Commercial Managed Care - PPO

## 2023-11-02 ENCOUNTER — Encounter (HOSPITAL_COMMUNITY)
Admission: RE | Admit: 2023-11-02 | Discharge: 2023-11-02 | Disposition: A | Discharge status: Home / Self Care | Source: Ambulatory Visit | Attending: Cardiovascular Disease | Admitting: Cardiovascular Disease

## 2023-11-02 DIAGNOSIS — Z48812 Encounter for surgical aftercare following surgery on the circulatory system: Secondary | ICD-10-CM | POA: Diagnosis not present

## 2023-11-02 DIAGNOSIS — Z951 Presence of aortocoronary bypass graft: Secondary | ICD-10-CM

## 2023-11-02 NOTE — Progress Notes (Signed)
 Daily Session Note  Patient Details  Name: Clinton [name] MRN: [id number] Date of Birth: Jun 08, 1956 Referring Provider:   Flowsheet Row INTENSIVE CARDIAC REHAB ORIENT from 10/12/2023 in [facility]  Referring Provider Andree Coss, MD       Encounter Date: 11/02/2023  Check In:  Session Check In - 11/02/23 0701       Check-In   Supervising physician immediately available to respond to emergencies [facility] MD immediately available    Physician(s) Eligha Bridegroom, NP    Location [facility]    Staff Present Valinda Party, MS, Exercise Physiologist;Kaylee Earlene Plater, MS, ACSM-CEP, Exercise Physiologist;Tasia Liz Remus Loffler, RN, MHA;Johnny Hale Bogus, MS, Exercise Physiologist    Virtual Visit No    Medication changes reported     No    Fall or balance concerns reported    No    Tobacco Cessation No Change    Warm-up and Cool-down Performed as group-led instruction    Resistance Training Performed Yes    VAD Patient? No    PAD/SET Patient? No      Pain Assessment   Currently in Pain? No/denies    Pain Score 0-No pain    Multiple Pain Sites No             Capillary Blood Glucose: No results found for this or any previous visit (from the past 24 hours).    Social History   Tobacco Use  Smoking Status Never  Smokeless Tobacco Former   Types: Chew   Quit date: 01/01/2015    Goals Met:  Independence with exercise equipment Exercise tolerated well No report of concerns or symptoms today Strength training completed today  Goals Unmet:  Not Applicable  Comments: Pt in cardiac rehab today. Pt tolerated light exercise without difficulty. VSS, telemetry-SR, asymptomatic. Medication list reconciled with no reported changes. Pt denies barriers to medication compliance.  PSYCHOSOCIAL ASSESSMENT:  PHQ9=0. Pt exhibits positive coping skills, hopeful outlook with supportive family. No psychosocial needs  identified at this time, no psychosocial interventions necessary. Pt oriented to exercise equipment and gym routine. Understanding verbalized.    Dr. Armanda Magic is Medical Director for Cardiac Rehab at [facility].

## 2023-11-02 NOTE — Telephone Encounter (Signed)
 Pt wife called stating they will be out of town on 12/02/23 and 12/04/23, requested to cancel his appts on those dates.

## 2023-11-04 ENCOUNTER — Encounter (HOSPITAL_COMMUNITY)
Admission: RE | Admit: 2023-11-04 | Discharge: 2023-11-04 | Discharge status: Home / Self Care | Source: Ambulatory Visit | Attending: Cardiovascular Disease

## 2023-11-04 ENCOUNTER — Ambulatory Visit (HOSPITAL_COMMUNITY): Payer: Commercial Managed Care - PPO

## 2023-11-04 DIAGNOSIS — Z48812 Encounter for surgical aftercare following surgery on the circulatory system: Secondary | ICD-10-CM | POA: Diagnosis not present

## 2023-11-04 DIAGNOSIS — Z951 Presence of aortocoronary bypass graft: Secondary | ICD-10-CM

## 2023-11-06 ENCOUNTER — Encounter (HOSPITAL_COMMUNITY)
Admission: RE | Admit: 2023-11-06 | Discharge: 2023-11-06 | Disposition: A | Discharge status: Home / Self Care | Source: Ambulatory Visit | Attending: Cardiovascular Disease | Admitting: Cardiovascular Disease

## 2023-11-06 ENCOUNTER — Ambulatory Visit (HOSPITAL_COMMUNITY): Payer: Commercial Managed Care - PPO

## 2023-11-06 DIAGNOSIS — Z48812 Encounter for surgical aftercare following surgery on the circulatory system: Secondary | ICD-10-CM | POA: Diagnosis not present

## 2023-11-06 DIAGNOSIS — Z951 Presence of aortocoronary bypass graft: Secondary | ICD-10-CM

## 2023-11-09 ENCOUNTER — Encounter (HOSPITAL_COMMUNITY)
Admission: RE | Admit: 2023-11-09 | Discharge: 2023-11-09 | Disposition: A | Discharge status: Home / Self Care | Source: Ambulatory Visit | Attending: Cardiovascular Disease | Admitting: Cardiovascular Disease

## 2023-11-09 ENCOUNTER — Ambulatory Visit (HOSPITAL_COMMUNITY): Payer: Commercial Managed Care - PPO

## 2023-11-09 DIAGNOSIS — Z48812 Encounter for surgical aftercare following surgery on the circulatory system: Secondary | ICD-10-CM | POA: Diagnosis not present

## 2023-11-09 DIAGNOSIS — Z951 Presence of aortocoronary bypass graft: Secondary | ICD-10-CM

## 2023-11-09 NOTE — Progress Notes (Signed)
 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  Patient's Home Medications on Admission:  Current Outpatient Medications:    amLODipine (NORVASC) 5 MG tablet, Take 1 tablet (5 mg total) by mouth daily., Disp: 90 tablet, Rfl: 3   aspirin EC 81 MG tablet, Take 81 mg by mouth at bedtime., Disp: , Rfl:    atorvastatin (LIPITOR) 80 MG tablet, Take 80 mg by mouth at bedtime., Disp: , Rfl:    clopidogrel (PLAVIX) 75 MG tablet, Take 1 tablet (75 mg total) by mouth daily., Disp: 30 tablet, Rfl: 11   ezetimibe (ZETIA) 10 MG tablet, Take 1 tablet (10 mg total) by mouth daily., Disp: 90 tablet, Rfl: 3   Fe Fum-Vit C-Vit B12-FA (TRIGELS-F FORTE) CAPS capsule, Take 1 capsule by mouth daily., Disp: 30 capsule, Rfl: 0   metoprolol tartrate (LOPRESSOR) 25 MG tablet, Take 0.5 tablets (12.5 mg total) by mouth 2 (two) times daily., Disp: 90 tablet, Rfl: 3   Multiple Vitamin (MULTIVITAMIN) capsule, Take 2 capsules by mouth every morning. Gummy, Disp: , Rfl:    Omega-3 Fatty Acids (OMEGA 3 PO), Take 2 capsules by mouth daily. 454 mg each, Disp: , Rfl:    Probiotic Product (PROBIOTIC PO), Take 220 mg by mouth daily., Disp: , Rfl:    vitamin C (ASCORBIC ACID) 250 MG tablet, Take 500 mg by mouth daily., Disp: , Rfl:    zinc sulfate 220 (50 Zn) MG capsule, Take 1 capsule (220 mg total) by mouth daily., Disp: , Rfl:   Past Medical History: Past Medical History:  Diagnosis Date   Carotid artery occlusion    Cellulitis     Erectile dysfunction    Hyperlipidemia    Hypertension    Peripheral vascular disease (HCC)     Tobacco Use: Social History   Tobacco Use  Smoking Status Never  Smokeless Tobacco Former   Types: Chew   Quit date: 01/01/2015    Labs: Review Flowsheet  More data exists      Latest Ref Rng & Units 10/28/2022 06/16/2023 06/22/2023 06/23/2023 06/24/2023  Labs for ITP Cardiac and Pulmonary Rehab  Cholestrol 0 - 200 mg/dL - 409  - - -  LDL (calc) 0 - 99 mg/dL - 70  - - -  HDL-C >81 mg/dL - 50  - - -  Trlycerides <150 mg/dL - 52  - - -  Hemoglobin A1c 4.8 - 5.6 % - 5.5  - 5.3  -  PH, Arterial 7.35 - 7.45 - - 7.42  - 7.302  7.232  7.473  7.474  7.447  7.528  7.383   PCO2 arterial 32 - 48 mmHg - - 39  - 40.0  44.2  25.6  32.9  34.5  28.4  40.9   Bicarbonate 20.0 - 28.0 mmol/L - - 25.3  - 19.5  18.6  19.0  24.2  23.9  23.3  23.6  24.4   TCO2 22 - 32 mmol/L 28  - - - 21  20  20  25  27  27  25  24  24  24  26  27    Acid-base deficit 0.0 - 2.0 mmol/L - - - - 6.0  8.0  4.0  1.0   O2 Saturation % - - 98.1  - 98  98  98  100  100  82  100  100     Details       Multiple values from one day are sorted in reverse-chronological order         Capillary Blood Glucose: Lab Results  Component Value Date   GLUCAP 135 (H) 06/27/2023   GLUCAP 100 (H) 06/27/2023   GLUCAP 99 06/27/2023   GLUCAP 95 06/26/2023   GLUCAP 138 (H) 06/26/2023     Exercise Target Goals: Exercise Program Goal: Individual exercise prescription set using results from initial 6 min walk test and THRR while considering  patient's activity barriers and safety.   Exercise Prescription Goal: Initial exercise prescription builds to 30-45 minutes a day of aerobic activity, 2-3 days per week.  Home exercise guidelines will be given to patient during program as part of exercise prescription that the participant will acknowledge.  Activity Barriers & Risk Stratification:  Activity Barriers & Cardiac Risk Stratification -  10/12/23 1359       Activity Barriers & Cardiac Risk Stratification   Activity Barriers Other (comment);Back Problems    Comments R BKA; sternal precautions    Cardiac Risk Stratification High   <5 METs on            6 Minute Walk:  6 Minute Walk     Row Name 10/12/23 1519         6 Minute Walk   Phase Initial     Distance 1270 feet     Walk Time 6 minutes     # of Rest Breaks 0     MPH 2.41     METS 3.23     RPE 12     Perceived Dyspnea  0     VO2 Peak 11.3     Symptoms Yes (comment)     Comments no s/sx or pain. back tightness     Resting HR 73 bpm     Resting BP 118/66     Resting Oxygen Saturation  97 %     Exercise Oxygen Saturation  during 6 min walk 97 %     Max Ex. HR 115 bpm     Max Ex. BP 154/70     2 Minute Post BP 126/64              Oxygen Initial Assessment:   Oxygen Re-Evaluation:   Oxygen Discharge (Final Oxygen Re-Evaluation):   Initial Exercise Prescription:  Initial Exercise Prescription - 10/12/23 1500       Date of Initial Exercise RX and Referring Provider   Date 10/12/23    Referring Provider Andree Coss, MD    Expected Discharge Date 01/06/24      NuStep   Level 2    SPM 70    Minutes 15    METs 2.5      Recumbant Elliptical   Level 1    RPM 60    Watts 80    Minutes 15    METs 2.5      Prescription Details   Frequency (times per week) 3    Duration Progress to 30 minutes of continuous aerobic without signs/symptoms of physical distress  Intensity   THRR 40-80% of Max Heartrate 61-122    Ratings of Perceived Exertion 11-13    Perceived Dyspnea 0-4      Progression   Progression Continue progressive overload as per policy without signs/symptoms or physical distress.      Resistance Training   Training Prescription Yes    Weight 3    Reps 10-15             Perform Capillary Blood Glucose checks as needed.  Exercise Prescription Changes:   Exercise Prescription Changes     Row  Name 11/02/23 1600             Response to Exercise   Blood Pressure (Admit) 128/60       Blood Pressure (Exercise) 120/52       Blood Pressure (Exit) 138/64       Heart Rate (Admit) 76 bpm       Heart Rate (Exercise) 100 bpm       Heart Rate (Exit) 81 bpm       Rating of Perceived Exertion (Exercise) 11.5       Perceived Dyspnea (Exercise) 0       Symptoms none       Comments Pt first day in CRP2       Duration Continue with 30 min of aerobic exercise without signs/symptoms of physical distress.       Intensity THRR unchanged         Progression   Progression Continue to progress workloads to maintain intensity without signs/symptoms of physical distress.       Average METs 2         Resistance Training   Training Prescription Yes       Weight 3       Reps 10-15       Time 10 Minutes         Interval Training   Interval Training No         NuStep   Level 2       SPM 61       Minutes 15       METs 1.6         Recumbant Elliptical   Level 1       RPM 50       Watts 59       Minutes 15       METs 2.4                Exercise Comments:   Exercise Comments     Row Name 11/02/23 1639           Exercise Comments Pt first day in CRP2 orientation. Pt tolerated exercise well with an avg MET level of 2.0 with no s/sx. Pt is adjusting to exercise equipment motion with prosthesis and stated it felt a little strange, but not painful and okay to remain on assigned equipment. Pt is learning his THRR, RPE scale, and ExRx. Overall pt off to a great start.                Exercise Goals and Review:   Exercise Goals     Row Name 10/12/23 1359             Exercise Goals   Increase Physical Activity Yes       Intervention Provide advice, education, support and counseling about physical activity/exercise needs.;Develop an individualized exercise prescription for aerobic and resistive training based on initial  evaluation findings, risk stratification,  comorbidities and participant's personal goals.       Expected Outcomes Short Term: Attend rehab on a regular basis to increase amount of physical activity.;Long Term: Exercising regularly at least 3-5 days a week.;Long Term: Add in home exercise to make exercise part of routine and to increase amount of physical activity.       Increase Strength and Stamina Yes       Intervention Provide advice, education, support and counseling about physical activity/exercise needs.;Develop an individualized exercise prescription for aerobic and resistive training based on initial evaluation findings, risk stratification, comorbidities and participant's personal goals.       Expected Outcomes Short Term: Increase workloads from initial exercise prescription for resistance, speed, and METs.;Short Term: Perform resistance training exercises routinely during rehab and add in resistance training at home;Long Term: Improve cardiorespiratory fitness, muscular endurance and strength as measured by increased METs and functional capacity ( )       Able to understand and use rate of perceived exertion (RPE) scale Yes       Intervention Provide education and explanation on how to use RPE scale       Expected Outcomes Short Term: Able to use RPE daily in rehab to express subjective intensity level;Long Term:  Able to use RPE to guide intensity level when exercising independently       Knowledge and understanding of Target Heart Rate Range (THRR) Yes       Intervention Provide education and explanation of THRR including how the numbers were predicted and where they are located for reference       Expected Outcomes Short Term: Able to state/look up THRR;Long Term: Able to use THRR to govern intensity when exercising independently;Short Term: Able to use daily as guideline for intensity in rehab       Understanding of Exercise Prescription Yes       Intervention Provide education, explanation, and written materials on patient's  individual exercise prescription       Expected Outcomes Short Term: Able to explain program exercise prescription;Long Term: Able to explain home exercise prescription to exercise independently                Exercise Goals Re-Evaluation :  Exercise Goals Re-Evaluation     Row Name 11/02/23 1637             Exercise Goal Re-Evaluation   Exercise Goals Review Increase Physical Activity;Understanding of Exercise Prescription;Increase Strength and Stamina;Knowledge and understanding of Target Heart Rate Range (THRR);Able to understand and use rate of perceived exertion (RPE) scale       Comments Pt first day in CRP2 orientation. Pt tolerated exercise well with an avg MET level of 2.0 with no s/sx. Pt is adjusting to exercise equipment motion with prosthesis and stated it felt a little strange, but not painful and okay to remain on assigned equipment. Pt is learning his THRR, RPE scale, and ExRx. Overall pt off to a great start.       Expected Outcomes Will continue to progress workloads as tolerated without s/sx.                Discharge Exercise Prescription (Final Exercise Prescription Changes):  Exercise Prescription Changes - 11/02/23 1600       Response to Exercise   Blood Pressure (Admit) 128/60    Blood Pressure (Exercise) 120/52    Blood Pressure (Exit) 138/64    Heart Rate (Admit) 76 bpm    Heart  Rate (Exercise) 100 bpm    Heart Rate (Exit) 81 bpm    Rating of Perceived Exertion (Exercise) 11.5    Perceived Dyspnea (Exercise) 0    Symptoms none    Comments Pt first day in CRP2    Duration Continue with 30 min of aerobic exercise without signs/symptoms of physical distress.    Intensity THRR unchanged      Progression   Progression Continue to progress workloads to maintain intensity without signs/symptoms of physical distress.    Average METs 2      Resistance Training   Training Prescription Yes    Weight 3    Reps 10-15    Time 10 Minutes       Interval Training   Interval Training No      NuStep   Level 2    SPM 61    Minutes 15    METs 1.6      Recumbant Elliptical   Level 1    RPM 50    Watts 59    Minutes 15    METs 2.4             Nutrition:  Target Goals: Understanding of nutrition guidelines, daily intake of sodium 1500mg , cholesterol 200mg , calories 30% from fat and 7% or less from saturated fats, daily to have 5 or more servings of fruits and vegetables.  Biometrics:  Pre Biometrics - 10/12/23 1356       Pre Biometrics   Waist Circumference 42 inches    Hip Circumference 38 inches    Waist to Hip Ratio 1.11 %    Triceps Skinfold 26 mm    % Body Fat 31.2 %    Grip Strength 38 kg    Flexibility 11.5 in    Single Leg Stand 2 seconds              Nutrition Therapy Plan and Nutrition Goals:  Nutrition Therapy & Goals - 11/02/23 0840       Nutrition Therapy   Diet Heart Healthy Diet    Drug/Food Interactions Statins/Certain Fruits      Personal Nutrition Goals   Nutrition Goal Patient to identify strategies for reducing cardiovascular risk by attending the Pritikin education and nutrition series weekly.    Personal Goal #2 Patient to improve diet quality by using the plate method as a guide for meal planning to include lean protein/plant protein, fruits, vegetables, whole grains, nonfat dairy as part of a well-balanced diet.    Personal Goal #3 Patient to identify strategies for weight loss of 0.5-2.0# per week.    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 Lipids and A1c are well controlled. He is motivated to lose weight and continue heart healthy diet for LDL improvement. Patient will benefit from participation in intensive cardiac rehab for nutrition, exercise, and lifestyle modification.      Intervention Plan   Intervention Prescribe, educate and counsel regarding individualized specific dietary modifications  aiming towards targeted core components such as weight, hypertension, lipid management, diabetes, heart failure and other comorbidities.;Nutrition handout(s) given to patient.    Expected Outcomes Short Term Goal: Understand basic principles of dietary content, such as calories, fat, sodium, cholesterol and nutrients.;Long Term Goal: Adherence to prescribed nutrition plan.             Nutrition Assessments:  Nutrition Assessments - 10/29/23 0923       Rate Your Plate Scores  Pre Score 71            MEDIFICTS Score Key: >=70 Need to make dietary changes  40-70 Heart Healthy Diet <= 40 Therapeutic Level Cholesterol Diet   Flowsheet Row INTENSIVE CARDIAC REHAB from 10/28/2023 in [facility]  Picture Your Plate Total Score on Admission 71      Picture Your Plate Scores: <54 Unhealthy dietary pattern with much room for improvement. 41-50 Dietary pattern unlikely to meet recommendations for good health and room for improvement. 51-60 More healthful dietary pattern, with some room for improvement.  >60 Healthy dietary pattern, although there may be some specific behaviors that could be improved.    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 Lipids and A1c are well controlled. He is motivated to lose weight and continue heart healthy diet for LDL improvement. Will continue to educate on strategies for weight loss including calorie density, the plate method, mindful eating.  Patient will benefit from participation in intensive cardiac rehab for nutrition, exercise, and lifestyle modification.                 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 Lipids and A1c are well controlled. He is motivated to lose weight and continue heart healthy diet for LDL improvement. Will continue to educate on strategies for weight loss including calorie density, the plate method, mindful eating.  Patient will benefit from participation in intensive cardiac rehab for nutrition, exercise, and lifestyle modification.                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 Lipids and A1c are well controlled. He is motivated to lose weight and continue heart healthy diet for LDL improvement. Will continue to educate on strategies for weight loss including calorie density, the plate method, mindful eating.  Patient will benefit from participation in intensive cardiac rehab for nutrition, exercise, and lifestyle modification.             Psychosocial: Target Goals: Acknowledge presence or absence of significant depression and/or stress, maximize coping skills, provide positive support system. Participant is able to verbalize types and ability to use techniques and skills needed for reducing stress and depression.  Initial Review & Psychosocial Screening:  Initial  Psych Review & Screening - 10/12/23 1403       Initial Review   Current issues with None  Identified      Family Dynamics   Good Support System? Yes   Wife and family for support     Barriers   Psychosocial barriers to participate in program There are no identifiable barriers or psychosocial needs.      Screening Interventions   Interventions Encouraged to exercise;Provide feedback about the scores to participant    Expected Outcomes Short Term goal: Identification and review with participant of any Quality of Life or Depression concerns found by scoring the questionnaire.;Long Term goal: The participant improves quality of Life and PHQ9 Scores as seen by post scores and/or verbalization of changes             Quality of Life Scores:  Quality of Life - 10/12/23 1417       Quality of Life   Select Quality of Life      Quality of Life Scores   Health/Function Pre 21.17 %    Socioeconomic Pre 27 %    Psych/Spiritual Pre 21.21 %    Family Pre 24 %    GLOBAL Pre 22.79 %            Scores of 19 and below usually indicate a poorer quality of life in these areas.  A difference of  2-3 points is a clinically meaningful difference.  A difference of 2-3 points in the total score of the Quality of Life Index has been associated with significant improvement in overall quality of life, self-image, physical symptoms, and general health in studies assessing change in quality of life.  PHQ-9: Review Flowsheet       10/12/2023 03/20/2023 06/16/2022 04/08/2022  Depression screen PHQ 2/9  Decreased Interest 0 0 0 0  Down, Depressed, Hopeless 0 0 0 0  PHQ - 2 Score 0 0 0 0  Altered sleeping 0 - 0 -  Tired, decreased energy 0 - 0 -  Change in appetite 0 - 0 -  Feeling bad or failure about yourself  0 - 0 -  Trouble concentrating 0 - 0 -  Moving slowly or fidgety/restless 0 - 0 -  Suicidal thoughts 0 - 0 -  PHQ-9 Score 0 - 0 -   Interpretation of Total Score  Total Score Depression Severity:  1-4 = Minimal depression, 5-9 = Mild depression, 10-14 = Moderate depression, 15-19  = Moderately severe depression, 20-27 = Severe depression   Psychosocial Evaluation and Intervention:   Psychosocial Re-Evaluation:  Psychosocial Re-Evaluation     Row Name 11/06/23 1335             Psychosocial Re-Evaluation   Current issues with None Identified       Interventions Encouraged to attend Cardiac Rehabilitation for the exercise       Continue Psychosocial Services  No Follow up required                Psychosocial Discharge (Final Psychosocial Re-Evaluation):  Psychosocial Re-Evaluation - 11/06/23 1335       Psychosocial Re-Evaluation   Current issues with None Identified    Interventions Encouraged to attend Cardiac Rehabilitation for the exercise    Continue Psychosocial Services  No Follow up required             Vocational Rehabilitation: Provide vocational rehab assistance to qualifying candidates.   Vocational Rehab Evaluation & Intervention:  Vocational Rehab - 10/12/23 1404  Initial Vocational Rehab Evaluation & Intervention   Assessment shows need for Vocational Rehabilitation No   Clinton [name] is working            Education: Education Goals: Education classes will be provided on a weekly basis, covering required topics. Participant will state understanding/return demonstration of topics presented.     Core Videos: Exercise    Move It!  Clinical staff conducted group or individual video education with verbal and written material and guidebook.  Patient learns the recommended Pritikin exercise program. Exercise with the goal of living a long, healthy life. Some of the health benefits of exercise include controlled diabetes, healthier blood pressure levels, improved cholesterol levels, improved heart and lung capacity, improved sleep, and better body composition. Everyone should speak with their doctor before starting or changing an exercise routine.  Biomechanical Limitations Clinical staff conducted group or individual video  education with verbal and written material and guidebook.  Patient learns how biomechanical limitations can impact exercise and how we can mitigate and possibly overcome limitations to have an impactful and balanced exercise routine.  Body Composition Clinical staff conducted group or individual video education with verbal and written material and guidebook.  Patient learns that body composition (ratio of muscle mass to fat mass) is a key component to assessing overall fitness, rather than body weight alone. Increased fat mass, especially visceral belly fat, can put [place] at increased risk for metabolic syndrome, type 2 diabetes, heart disease, and even death. It is recommended to combine diet and exercise (cardiovascular and resistance training) to improve your body composition. Seek guidance from your physician and exercise physiologist before implementing an exercise routine.  Exercise Action Plan Clinical staff conducted group or individual video education with verbal and written material and guidebook.  Patient learns the recommended strategies to achieve and enjoy long-term exercise adherence, including variety, self-motivation, self-efficacy, and positive decision making. Benefits of exercise include fitness, good health, weight management, more energy, better sleep, less stress, and overall well-being.  Medical   Heart Disease Risk Reduction Clinical staff conducted group or individual video education with verbal and written material and guidebook.  Patient learns our heart is our most vital organ as it circulates oxygen, nutrients, white blood cells, and hormones throughout the entire body, and carries waste away. Data supports a plant-based eating plan like the Pritikin Program for its effectiveness in slowing progression of and reversing heart disease. The video provides a number of recommendations to address heart disease.   Metabolic Syndrome and Belly Fat  Clinical staff conducted group  or individual video education with verbal and written material and guidebook.  Patient learns what metabolic syndrome is, how it leads to heart disease, and how one can reverse it and keep it from coming back. You have metabolic syndrome if you have 3 of the following 5 criteria: abdominal obesity, high blood pressure, high triglycerides, low HDL cholesterol, and high blood sugar.  Hypertension and Heart Disease Clinical staff conducted group or individual video education with verbal and written material and guidebook.  Patient learns that high blood pressure, or hypertension, is very common in the [place]. Hypertension is largely due to excessive salt intake, but other important risk factors include being overweight, physical inactivity, drinking too much alcohol, smoking, and not eating enough potassium from fruits and vegetables. High blood pressure is a leading risk factor for heart attack, stroke, congestive heart failure, dementia, kidney failure, and premature death. Long-term effects of excessive salt intake include stiffening of the  arteries and thickening of heart muscle and organ damage. Recommendations include ways to reduce hypertension and the risk of heart disease.  Diseases of Our Time - Focusing on Diabetes Clinical staff conducted group or individual video education with verbal and written material and guidebook.  Patient learns why the best way to stop diseases of our time is prevention, through food and other lifestyle changes. Medicine (such as prescription pills and surgeries) is often only a Band-Aid on the problem, not a long-term solution. Most common diseases of our time include obesity, type 2 diabetes, hypertension, heart disease, and cancer. The Pritikin Program is recommended and has been proven to help reduce, reverse, and/or prevent the damaging effects of metabolic syndrome.  Nutrition   Overview of the Pritikin Eating Plan  Clinical staff conducted group or  individual video education with verbal and written material and guidebook.  Patient learns about the Pritikin Eating Plan for disease risk reduction. The Pritikin Eating Plan emphasizes a wide variety of unrefined, minimally-processed carbohydrates, like fruits, vegetables, whole grains, and legumes. Go, Caution, and Stop food choices are explained. Plant-based and lean animal proteins are emphasized. Rationale provided for low sodium intake for blood pressure control, low added sugars for blood sugar stabilization, and low added fats and oils for coronary artery disease risk reduction and weight management.  Calorie Density  Clinical staff conducted group or individual video education with verbal and written material and guidebook.  Patient learns about calorie density and how it impacts the Pritikin Eating Plan. Knowing the characteristics of the food you choose will help you decide whether those foods will lead to weight gain or weight loss, and whether you want to consume more or less of them. Weight loss is usually a side effect of the Pritikin Eating Plan because of its focus on low calorie-dense foods.  Label Reading  Clinical staff conducted group or individual video education with verbal and written material and guidebook.  Patient learns about the Pritikin recommended label reading guidelines and corresponding recommendations regarding calorie density, added sugars, sodium content, and whole grains.  Dining Out - Part 1  Clinical staff conducted group or individual video education with verbal and written material and guidebook.  Patient learns that restaurant meals can be sabotaging because they can be so high in calories, fat, sodium, and/or sugar. Patient learns recommended strategies on how to positively address this and avoid unhealthy pitfalls.  Facts on Fats  Clinical staff conducted group or individual video education with verbal and written material and guidebook.  Patient learns  that lifestyle modifications can be just as effective, if not more so, as many medications for lowering your risk of heart disease. A Pritikin lifestyle can help to reduce your risk of inflammation and atherosclerosis (cholesterol build-up, or plaque, in the artery walls). Lifestyle interventions such as dietary choices and physical activity address the cause of atherosclerosis. A review of the types of fats and their impact on blood cholesterol levels, along with dietary recommendations to reduce fat intake is also included.  Nutrition Action Plan  Clinical staff conducted group or individual video education with verbal and written material and guidebook.  Patient learns how to incorporate Pritikin recommendations into their lifestyle. Recommendations include planning and keeping personal health goals in mind as an important part of their success.  Healthy Mind-Set    Healthy Minds, Bodies, Hearts  Clinical staff conducted group or individual video education with verbal and written material and guidebook.  Patient learns how to identify when  they are stressed. Video will discuss the impact of that stress, as well as the many benefits of stress management. Patient will also be introduced to stress management techniques. The way we think, act, and feel has an impact on our hearts.  How Our Thoughts Can Heal Our Hearts  Clinical staff conducted group or individual video education with verbal and written material and guidebook.  Patient learns that negative thoughts can cause depression and anxiety. This can result in negative lifestyle behavior and serious health problems. Cognitive behavioral therapy is an effective method to help control our thoughts in order to change and improve our emotional outlook.  Additional Videos:  Exercise    Improving Performance  Clinical staff conducted group or individual video education with verbal and written material and guidebook.  Patient learns to use a  non-linear approach by alternating intensity levels and lengths of time spent exercising to help burn more calories and lose more body fat. Cardiovascular exercise helps improve heart health, metabolism, hormonal balance, blood sugar control, and recovery from fatigue. Resistance training improves strength, endurance, balance, coordination, reaction time, metabolism, and muscle mass. Flexibility exercise improves circulation, posture, and balance. Seek guidance from your physician and exercise physiologist before implementing an exercise routine and learn your capabilities and proper form for all exercise.  Introduction to Yoga  Clinical staff conducted group or individual video education with verbal and written material and guidebook.  Patient learns about yoga, a discipline of the coming together of mind, breath, and body. The benefits of yoga include improved flexibility, improved range of motion, better posture and core strength, increased lung function, weight loss, and positive self-image. Yoga's heart health benefits include lowered blood pressure, healthier heart rate, decreased cholesterol and triglyceride levels, improved immune function, and reduced stress. Seek guidance from your physician and exercise physiologist before implementing an exercise routine and learn your capabilities and proper form for all exercise.  Medical   Aging: Enhancing Your Quality of Life  Clinical staff conducted group or individual video education with verbal and written material and guidebook.  Patient learns key strategies and recommendations to stay in good physical health and enhance quality of life, such as prevention strategies, having an advocate, securing a Health Care Proxy and Power of Attorney, and keeping a list of medications and system for tracking them. It also discusses how to avoid risk for bone loss.  Biology of Weight Control  Clinical staff conducted group or individual video education with  verbal and written material and guidebook.  Patient learns that weight gain occurs because we consume more calories than we burn (eating more, moving less). Even if your body weight is normal, you may have higher ratios of fat compared to muscle mass. Too much body fat puts you at increased risk for cardiovascular disease, heart attack, stroke, type 2 diabetes, and obesity-related cancers. In addition to exercise, following the Pritikin Eating Plan can help reduce your risk.  Decoding Lab Results  Clinical staff conducted group or individual video education with verbal and written material and guidebook.  Patient learns that lab test reflects one measurement whose values change over time and are influenced by many factors, including medication, stress, sleep, exercise, food, hydration, pre-existing medical conditions, and more. It is recommended to use the knowledge from this video to become more involved with your lab results and evaluate your numbers to speak with your doctor.   Diseases of Our Time - Overview  Clinical staff conducted group or individual video education with  verbal and written material and guidebook.  Patient learns that according to the CDC, 50% to 70% of chronic diseases (such as obesity, type 2 diabetes, elevated lipids, hypertension, and heart disease) are avoidable through lifestyle improvements including healthier food choices, listening to satiety cues, and increased physical activity.  Sleep Disorders Clinical staff conducted group or individual video education with verbal and written material and guidebook.  Patient learns how good quality and duration of sleep are important to overall health and well-being. Patient also learns about sleep disorders and how they impact health along with recommendations to address them, including discussing with a physician.  Nutrition  Dining Out - Part 2 Clinical staff conducted group or individual video education with verbal and  written material and guidebook.  Patient learns how to plan ahead and communicate in order to maximize their dining experience in a healthy and nutritious manner. Included are recommended food choices based on the type of restaurant the patient is visiting.   Fueling a Banker conducted group or individual video education with verbal and written material and guidebook.  There is a strong connection between our food choices and our health. Diseases like obesity and type 2 diabetes are very prevalent and are in large-part due to lifestyle choices. The Pritikin Eating Plan provides plenty of food and hunger-curbing satisfaction. It is easy to follow, affordable, and helps reduce health risks.  Menu Workshop  Clinical staff conducted group or individual video education with verbal and written material and guidebook.  Patient learns that restaurant meals can sabotage health goals because they are often packed with calories, fat, sodium, and sugar. Recommendations include strategies to plan ahead and to communicate with the manager, chef, or server to help order a healthier meal.  Planning Your Eating Strategy  Clinical staff conducted group or individual video education with verbal and written material and guidebook.  Patient learns about the Pritikin Eating Plan and its benefit of reducing the risk of disease. The Pritikin Eating Plan does not focus on calories. Instead, it emphasizes high-quality, nutrient-rich foods. By knowing the characteristics of the foods, we choose, we can determine their calorie density and make informed decisions.  Targeting Your Nutrition Priorities  Clinical staff conducted group or individual video education with verbal and written material and guidebook.  Patient learns that lifestyle habits have a tremendous impact on disease risk and progression. This video provides eating and physical activity recommendations based on your personal health goals,  such as reducing LDL cholesterol, losing weight, preventing or controlling type 2 diabetes, and reducing high blood pressure.  Vitamins and Minerals  Clinical staff conducted group or individual video education with verbal and written material and guidebook.  Patient learns different ways to obtain key vitamins and minerals, including through a recommended healthy diet. It is important to discuss all supplements you take with your doctor.   Healthy Mind-Set    Smoking Cessation  Clinical staff conducted group or individual video education with verbal and written material and guidebook.  Patient learns that cigarette smoking and tobacco addiction pose a serious health risk which affects millions of people. Stopping smoking will significantly reduce the risk of heart disease, lung disease, and many forms of cancer. Recommended strategies for quitting are covered, including working with your doctor to develop a successful plan.  Culinary   Becoming a Set designer conducted group or individual video education with verbal and written material and guidebook.  Patient learns that cooking at  home can be healthy, cost-effective, quick, and puts them in control. Keys to cooking healthy recipes will include looking at your recipe, assessing your equipment needs, planning ahead, making it simple, choosing cost-effective seasonal ingredients, and limiting the use of added fats, salts, and sugars.  Cooking - Breakfast and Snacks  Clinical staff conducted group or individual video education with verbal and written material and guidebook.  Patient learns how important breakfast is to satiety and nutrition through the entire day. Recommendations include key foods to eat during breakfast to help stabilize blood sugar levels and to prevent overeating at meals later in the day. Planning ahead is also a key component.  Cooking - Educational psychologist conducted group or individual video  education with verbal and written material and guidebook.  Patient learns eating strategies to improve overall health, including an approach to cook more at home. Recommendations include thinking of animal protein as a side on your plate rather than center stage and focusing instead on lower calorie dense options like vegetables, fruits, whole grains, and plant-based proteins, such as beans. Making sauces in large quantities to freeze for later and leaving the skin on your vegetables are also recommended to maximize your experience.  Cooking - Healthy Salads and Dressing Clinical staff conducted group or individual video education with verbal and written material and guidebook.  Patient learns that vegetables, fruits, whole grains, and legumes are the foundations of the Pritikin Eating Plan. Recommendations include how to incorporate each of these in flavorful and healthy salads, and how to create homemade salad dressings. Proper handling of ingredients is also covered. Cooking - Soups and [employer] - Soups and Desserts Clinical staff conducted group or individual video education with verbal and written material and guidebook.  Patient learns that Pritikin soups and desserts make for easy, nutritious, and delicious snacks and meal components that are low in sodium, fat, sugar, and calorie density, while high in vitamins, minerals, and filling fiber. Recommendations include simple and healthy ideas for soups and desserts.   Overview     The Pritikin Solution Program Overview Clinical staff conducted group or individual video education with verbal and written material and guidebook.  Patient learns that the results of the Pritikin Program have been documented in more than 100 articles published in peer-reviewed journals, and the benefits include reducing risk factors for (and, in some cases, even reversing) high cholesterol, high blood pressure, type 2 diabetes, obesity, and more! An overview of  the three key pillars of the Pritikin Program will be covered: eating well, doing regular exercise, and having a healthy mind-set.  WORKSHOPS  Exercise: Exercise Basics: Building Your Action Plan Clinical staff led group instruction and group discussion with PowerPoint presentation and patient guidebook. To enhance the learning environment the use of posters, models and videos may be added. At the conclusion of this workshop, patients will comprehend the difference between physical activity and exercise, as well as the benefits of incorporating both, into their routine. Patients will understand the FITT (Frequency, Intensity, Time, and Type) principle and how to use it to build an exercise action plan. In addition, safety concerns and other considerations for exercise and cardiac rehab will be addressed by the presenter. The purpose of this lesson is to promote a comprehensive and effective weekly exercise routine in order to improve patients' overall level of fitness.   Managing Heart Disease: Your Path to a Healthier Heart Clinical staff led group instruction and group discussion with PowerPoint  presentation and patient guidebook. To enhance the learning environment the use of posters, models and videos may be added.At the conclusion of this workshop, patients will understand the anatomy and physiology of the heart. Additionally, they will understand how Pritikin's three pillars impact the risk factors, the progression, and the management of heart disease.  The purpose of this lesson is to provide a high-level overview of the heart, heart disease, and how the Pritikin lifestyle positively impacts risk factors.  Exercise Biomechanics Clinical staff led group instruction and group discussion with PowerPoint presentation and patient guidebook. To enhance the learning environment the use of posters, models and videos may be added. Patients will learn how the structural parts of their bodies  function and how these functions impact their daily activities, movement, and exercise. Patients will learn how to promote a neutral spine, learn how to manage pain, and identify ways to improve their physical movement in order to promote healthy living. The purpose of this lesson is to expose patients to common physical limitations that impact physical activity. Participants will learn practical ways to adapt and manage aches and pains, and to minimize their effect on regular exercise. Patients will learn how to maintain good posture while sitting, walking, and lifting.  Balance Training and Fall Prevention  Clinical staff led group instruction and group discussion with PowerPoint presentation and patient guidebook. To enhance the learning environment the use of posters, models and videos may be added. At the conclusion of this workshop, patients will understand the importance of their sensorimotor skills (vision, proprioception, and the vestibular system) in maintaining their ability to balance as they age. Patients will apply a variety of balancing exercises that are appropriate for their current level of function. Patients will understand the common causes for poor balance, possible solutions to these problems, and ways to modify their physical environment in order to minimize their fall risk. The purpose of this lesson is to teach patients about the importance of maintaining balance as they age and ways to minimize their risk of falling.  WORKSHOPS   Nutrition:  Fueling a Ship broker led group instruction and group discussion with PowerPoint presentation and patient guidebook. To enhance the learning environment the use of posters, models and videos may be added. Patients will review the foundational principles of the Pritikin Eating Plan and understand what constitutes a serving size in each of the food groups. Patients will also learn Pritikin-friendly foods that are better  choices when away from home and review make-ahead meal and snack options. Calorie density will be reviewed and applied to three nutrition priorities: weight maintenance, weight loss, and weight gain. The purpose of this lesson is to reinforce (in a group setting) the key concepts around what patients are recommended to eat and how to apply these guidelines when away from home by planning and selecting Pritikin-friendly options. Patients will understand how calorie density may be adjusted for different weight management goals.  Mindful Eating  Clinical staff led group instruction and group discussion with PowerPoint presentation and patient guidebook. To enhance the learning environment the use of posters, models and videos may be added. Patients will briefly review the concepts of the Pritikin Eating Plan and the importance of low-calorie dense foods. The concept of mindful eating will be introduced as well as the importance of paying attention to internal hunger signals. Triggers for non-hunger eating and techniques for dealing with triggers will be explored. The purpose of this lesson is to provide patients with the  opportunity to review the basic principles of the Pritikin Eating Plan, discuss the value of eating mindfully and how to measure internal cues of hunger and fullness using the Hunger Scale. Patients will also discuss reasons for non-hunger eating and learn strategies to use for controlling emotional eating.  Targeting Your Nutrition Priorities Clinical staff led group instruction and group discussion with PowerPoint presentation and patient guidebook. To enhance the learning environment the use of posters, models and videos may be added. Patients will learn how to determine their genetic susceptibility to disease by reviewing their family history. Patients will gain insight into the importance of diet as part of an overall healthy lifestyle in mitigating the impact of genetics and other  environmental insults. The purpose of this lesson is to provide patients with the opportunity to assess their personal nutrition priorities by looking at their family history, their own health history and current risk factors. Patients will also be able to discuss ways of prioritizing and modifying the Pritikin Eating Plan for their highest risk areas  Menu  Clinical staff led group instruction and group discussion with PowerPoint presentation and patient guidebook. To enhance the learning environment the use of posters, models and videos may be added. Using menus brought in from [employer], or printed from [employer], patients will apply the Pritikin dining out guidelines that were presented in the [employer] video. Patients will also be able to practice these guidelines in a variety of provided scenarios. The purpose of this lesson is to provide patients with the opportunity to practice hands-on learning of the Pritikin Dining Out guidelines with actual menus and practice scenarios.  Label Reading Clinical staff led group instruction and group discussion with PowerPoint presentation and patient guidebook. To enhance the learning environment the use of posters, models and videos may be added. Patients will review and discuss the Pritikin label reading guidelines presented in Pritikin's Label Reading Educational series video. Using fool labels brought in from local grocery stores and markets, patients will apply the label reading guidelines and determine if the packaged food meet the Pritikin guidelines. The purpose of this lesson is to provide patients with the opportunity to review, discuss, and practice hands-on learning of the Pritikin Label Reading guidelines with actual packaged food labels. Cooking School  Pritikin's [employer] are designed to teach patients ways to prepare quick, simple, and affordable recipes at home. The importance of nutrition's role in  chronic disease risk reduction is reflected in its emphasis in the overall Pritikin program. By learning how to prepare essential core Pritikin Eating Plan recipes, patients will increase control over what they eat; be able to customize the flavor of foods without the use of added salt, sugar, or fat; and improve the quality of the food they consume. By learning a set of core recipes which are easily assembled, quickly prepared, and affordable, patients are more likely to prepare more healthy foods at home. These workshops focus on convenient breakfasts, simple entres, side dishes, and desserts which can be prepared with minimal effort and are consistent with nutrition recommendations for cardiovascular risk reduction. Cooking [employer] are taught by a Armed forces logistics/support/administrative officer (RD) who has been trained by the [employer]. The chef or RD has a clear understanding of the importance of minimizing - if not completely eliminating - added fat, sugar, and sodium in recipes. Throughout the series of Cooking School Workshop sessions, patients will learn about healthy ingredients and efficient methods of  cooking to build confidence in their capability to prepare    Cooking School weekly topics:  Adding Flavor- Sodium-Free  Fast and Healthy Breakfasts  Powerhouse Plant-Based Proteins  Satisfying Salads and Dressings  Simple Sides and Sauces  International Cuisine-Spotlight on the [employer] Zones  Delicious Desserts  Savory Soups  [employer] - Meals in a Snap  Tasty Appetizers and Snacks  Comforting Weekend Breakfasts  One-Pot Wonders   Fast Evening Meals  Landscape architect Your Pritikin Plate  WORKSHOPS   Healthy Mindset (Psychosocial):  Focused Goals, Sustainable Changes Clinical staff led group instruction and group discussion with PowerPoint presentation and patient guidebook. To enhance the learning environment the use of posters, models and videos may  be added. Patients will be able to apply effective goal setting strategies to establish at least one personal goal, and then take consistent, meaningful action toward that goal. They will learn to identify common barriers to achieving personal goals and develop strategies to overcome them. Patients will also gain an understanding of how our mind-set can impact our ability to achieve goals and the importance of cultivating a positive and growth-oriented mind-set. The purpose of this lesson is to provide patients with a deeper understanding of how to set and achieve personal goals, as well as the tools and strategies needed to overcome common obstacles which may arise along the way.  From Head to Heart: The Power of a Healthy Outlook  Clinical staff led group instruction and group discussion with PowerPoint presentation and patient guidebook. To enhance the learning environment the use of posters, models and videos may be added. Patients will be able to recognize and describe the impact of emotions and mood on physical health. They will discover the importance of self-care and explore self-care practices which may work for them. Patients will also learn how to utilize the 4 C's to cultivate a healthier outlook and better manage stress and challenges. The purpose of this lesson is to demonstrate to patients how a healthy outlook is an essential part of maintaining good health, especially as they continue their cardiac rehab journey.  Healthy Sleep for a Healthy Heart Clinical staff led group instruction and group discussion with PowerPoint presentation and patient guidebook. To enhance the learning environment the use of posters, models and videos may be added. At the conclusion of this workshop, patients will be able to demonstrate knowledge of the importance of sleep to overall health, well-being, and quality of life. They will understand the symptoms of, and treatments for, common sleep disorders. Patients  will also be able to identify daytime and nighttime behaviors which impact sleep, and they will be able to apply these tools to help manage sleep-related challenges. The purpose of this lesson is to provide patients with a general overview of sleep and outline the importance of quality sleep. Patients will learn about a few of the most common sleep disorders. Patients will also be introduced to the concept of "sleep hygiene," and discover ways to self-manage certain sleeping problems through simple daily behavior changes. Finally, the workshop will motivate patients by clarifying the links between quality sleep and their goals of heart-healthy living.   Recognizing and Reducing Stress Clinical staff led group instruction and group discussion with PowerPoint presentation and patient guidebook. To enhance the learning environment the use of posters, models and videos may be added. At the conclusion of this workshop, patients will be able to understand the types of stress reactions, differentiate between acute and chronic stress, and  recognize the impact that chronic stress has on their health. They will also be able to apply different coping mechanisms, such as reframing negative self-talk. Patients will have the opportunity to practice a variety of stress management techniques, such as deep abdominal breathing, progressive muscle relaxation, and/or guided imagery.  The purpose of this lesson is to educate patients on the role of stress in their lives and to provide healthy techniques for coping with it.  Learning Barriers/Preferences:  Learning Barriers/Preferences - 10/12/23 1404       Learning Barriers/Preferences   Learning Barriers Sight   reading glasses   Learning Preferences Audio;Computer/Internet;Group Instruction;Individual Instruction;Skilled Demonstration;Verbal Instruction;Video;Written Material;Pictoral             Education Topics:  Knowledge Questionnaire Score:  Knowledge  Questionnaire Score - 10/12/23 1404       Knowledge Questionnaire Score   Pre Score 21/24             Core Components/Risk Factors/Patient Goals at Admission:  Personal Goals and Risk Factors at Admission - 10/12/23 1404       Core Components/Risk Factors/Patient Goals on Admission    Weight Management Yes;Weight Loss    Intervention Weight Management: Provide education and appropriate resources to help participant work on and attain dietary goals.;Weight Management: Develop a combined nutrition and exercise program designed to reach desired caloric intake, while maintaining appropriate intake of nutrient and fiber, sodium and fats, and appropriate energy expenditure required for the weight goal.    Admit Weight 205 lb 0.4 oz (93 kg)    Goal Weight: Long Term 187 lb (84.8 kg)   pt goal   Expected Outcomes Short Term: Continue to assess and modify interventions until short term weight is achieved;Long Term: Adherence to nutrition and physical activity/exercise program aimed toward attainment of established weight goal;Weight Loss: Understanding of general recommendations for a balanced deficit meal plan, which promotes 1-2 lb weight loss per week and includes a negative energy balance of [phone] kcal/d;Understanding recommendations for meals to include 15-35% energy as protein, 25-35% energy from fat, 35-60% energy from carbohydrates, less than 200mg  of dietary cholesterol, 20-35 gm of total fiber daily;Understanding of distribution of calorie intake throughout the day with the consumption of 4-5 meals/snacks    Hypertension Yes    Intervention Provide education on lifestyle modifcations including regular physical activity/exercise, weight management, moderate sodium restriction and increased consumption of fresh fruit, vegetables, and low fat dairy, alcohol moderation, and smoking cessation.;Monitor prescription use compliance.    Expected Outcomes Short Term: Continued assessment and  intervention until BP is < 140/50mm HG in hypertensive participants. < 130/17mm HG in hypertensive participants with diabetes, heart failure or chronic kidney disease.;Long Term: Maintenance of blood pressure at goal levels.    Lipids Yes    Intervention Provide education and support for participant on nutrition & aerobic/resistive exercise along with prescribed medications to achieve LDL 70mg , HDL >40mg .    Expected Outcomes Short Term: Participant states understanding of desired cholesterol values and is compliant with medications prescribed. Participant is following exercise prescription and nutrition guidelines.;Long Term: Cholesterol controlled with medications as prescribed, with individualized exercise RX and with personalized nutrition plan. Value goals: LDL < 70mg , HDL > 40 mg.             Core Components/Risk Factors/Patient Goals Review:   Goals and Risk Factor Review     Row Name 11/06/23 1335             Core Components/Risk Factors/Patient Goals  Review   Personal Goals Review Weight Management/Obesity;Hypertension;Lipids       Review Clinton [name] started cardiac rehab on 11/02/23. Clinton [name] is off to a good start to exercise. Vital signs have been stable.       Expected Outcomes Clinton [name] has lost 2.4 kg since starting cardiac rehab.                Core Components/Risk Factors/Patient Goals at Discharge (Final Review):   Goals and Risk Factor Review - 11/06/23 1335       Core Components/Risk Factors/Patient Goals Review   Personal Goals Review Weight Management/Obesity;Hypertension;Lipids    Review Clinton [name] started cardiac rehab on 11/02/23. Clinton [name] is off to a good start to exercise. Vital signs have been stable.    Expected Outcomes Clinton [name] has lost 2.4 kg since starting cardiac rehab.             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 Introduction to [facility]ik[facility]  education/intensive cardiac rehab. Initial orientation packet reviewed with patient. 30 Day ITP Review. Dewane started cardiac rehab on 11/02/23               Comments: See ITP comment

## 2023-11-10 ENCOUNTER — Encounter: Payer: Self-pay | Admitting: Pulmonary Disease

## 2023-11-10 ENCOUNTER — Ambulatory Visit (INDEPENDENT_AMBULATORY_CARE_PROVIDER_SITE_OTHER): Payer: Commercial Managed Care - PPO | Admitting: Pulmonary Disease

## 2023-11-10 VITALS — BP 162/74 | HR 81 | Ht 70.0 in | Wt 201.0 lb

## 2023-11-10 DIAGNOSIS — I450 Right fascicular block: Secondary | ICD-10-CM | POA: Diagnosis not present

## 2023-11-10 DIAGNOSIS — R918 Other nonspecific abnormal finding of lung field: Secondary | ICD-10-CM | POA: Diagnosis not present

## 2023-11-10 DIAGNOSIS — R590 Localized enlarged lymph nodes: Secondary | ICD-10-CM

## 2023-11-10 NOTE — Patient Instructions (Signed)
 Nice to see you today  We saw you in consultation for the calcifications and enlarged lymph nodes in the chest.  We can see this with sarcoidosis.  Fortunately, you do not have any symptoms of pulmonary sarcoidosis which would prompt escalation or new therapy or treatment.  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  Given minimal to no symptoms, we decided to plan on a repeat CT scan in November, to be 2 years from your prior CT scan that I can review.  We can discuss next steps at that time.  I will send Dr. Allyson Sabal a message about your EKG and if something like sarcoidosis could contribute and if so should be escalate or speed up the timeline.    Return to clinic in 8 months with Dr. Judeth Horn after CT scan

## 2023-11-11 ENCOUNTER — Ambulatory Visit (HOSPITAL_COMMUNITY): Payer: Commercial Managed Care - PPO

## 2023-11-11 ENCOUNTER — Encounter (HOSPITAL_COMMUNITY)
Admission: RE | Admit: 2023-11-11 | Discharge: 2023-11-11 | Disposition: A | Discharge status: Home / Self Care | Source: Ambulatory Visit | Attending: Cardiovascular Disease | Admitting: Cardiovascular Disease

## 2023-11-11 DIAGNOSIS — Z951 Presence of aortocoronary bypass graft: Secondary | ICD-10-CM | POA: Diagnosis present

## 2023-11-13 ENCOUNTER — Ambulatory Visit (HOSPITAL_COMMUNITY): Payer: Commercial Managed Care - PPO

## 2023-11-13 ENCOUNTER — Encounter (HOSPITAL_COMMUNITY)
Admission: RE | Admit: 2023-11-13 | Discharge: 2023-11-13 | Disposition: A | Discharge status: Home / Self Care | Source: Ambulatory Visit | Attending: Cardiovascular Disease | Admitting: Cardiovascular Disease

## 2023-11-13 DIAGNOSIS — Z951 Presence of aortocoronary bypass graft: Secondary | ICD-10-CM

## 2023-11-16 ENCOUNTER — Ambulatory Visit (HOSPITAL_COMMUNITY): Payer: Commercial Managed Care - PPO

## 2023-11-16 ENCOUNTER — Encounter (HOSPITAL_COMMUNITY): Admission: RE | Admit: 2023-11-16 | Source: Ambulatory Visit

## 2023-11-16 ENCOUNTER — Telehealth (HOSPITAL_COMMUNITY): Payer: Self-pay

## 2023-11-16 NOTE — Telephone Encounter (Signed)
 Patient called Sunday night stating they had car trouble and would not be in for 6:45am class, but will be back on Wednesday.

## 2023-11-18 ENCOUNTER — Encounter (HOSPITAL_COMMUNITY)
Admission: RE | Admit: 2023-11-18 | Discharge: 2023-11-18 | Disposition: A | Discharge status: Home / Self Care | Source: Ambulatory Visit | Attending: Cardiovascular Disease | Admitting: Cardiovascular Disease

## 2023-11-18 ENCOUNTER — Ambulatory Visit (HOSPITAL_COMMUNITY): Payer: Commercial Managed Care - PPO

## 2023-11-18 DIAGNOSIS — Z951 Presence of aortocoronary bypass graft: Secondary | ICD-10-CM

## 2023-11-20 ENCOUNTER — Encounter (HOSPITAL_COMMUNITY)
Admission: RE | Admit: 2023-11-20 | Discharge: 2023-11-20 | Disposition: A | Discharge status: Home / Self Care | Source: Ambulatory Visit | Attending: Cardiovascular Disease | Admitting: Cardiovascular Disease

## 2023-11-20 ENCOUNTER — Ambulatory Visit (HOSPITAL_COMMUNITY): Payer: Commercial Managed Care - PPO

## 2023-11-20 DIAGNOSIS — Z951 Presence of aortocoronary bypass graft: Secondary | ICD-10-CM

## 2023-11-23 ENCOUNTER — Encounter (HOSPITAL_COMMUNITY)
Admission: RE | Admit: 2023-11-23 | Discharge: 2023-11-23 | Disposition: A | Discharge status: Home / Self Care | Source: Ambulatory Visit | Attending: Cardiovascular Disease | Admitting: Cardiovascular Disease

## 2023-11-23 ENCOUNTER — Ambulatory Visit (HOSPITAL_COMMUNITY): Payer: Commercial Managed Care - PPO

## 2023-11-23 DIAGNOSIS — Z951 Presence of aortocoronary bypass graft: Secondary | ICD-10-CM

## 2023-11-25 ENCOUNTER — Ambulatory Visit (HOSPITAL_COMMUNITY): Payer: Commercial Managed Care - PPO

## 2023-11-25 ENCOUNTER — Encounter (HOSPITAL_COMMUNITY)
Admission: RE | Admit: 2023-11-25 | Discharge: 2023-11-25 | Disposition: A | Discharge status: Home / Self Care | Source: Ambulatory Visit | Attending: Cardiovascular Disease | Admitting: Cardiovascular Disease

## 2023-11-25 DIAGNOSIS — Z951 Presence of aortocoronary bypass graft: Secondary | ICD-10-CM | POA: Diagnosis not present

## 2023-11-27 ENCOUNTER — Encounter (HOSPITAL_COMMUNITY)

## 2023-11-27 ENCOUNTER — Ambulatory Visit (HOSPITAL_COMMUNITY): Payer: Commercial Managed Care - PPO

## 2023-11-30 ENCOUNTER — Encounter: Payer: Self-pay | Admitting: Pulmonary Disease

## 2023-11-30 ENCOUNTER — Encounter (HOSPITAL_COMMUNITY)
Admission: RE | Admit: 2023-11-30 | Discharge: 2023-11-30 | Disposition: A | Discharge status: Home / Self Care | Source: Ambulatory Visit | Attending: Cardiovascular Disease

## 2023-11-30 ENCOUNTER — Ambulatory Visit (HOSPITAL_COMMUNITY): Payer: Commercial Managed Care - PPO

## 2023-11-30 DIAGNOSIS — Z951 Presence of aortocoronary bypass graft: Secondary | ICD-10-CM | POA: Diagnosis not present

## 2023-11-30 NOTE — Progress Notes (Signed)
 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  Patient's Home Medications on Admission:  Current Outpatient Medications:    amLODipine  (NORVASC ) 5 MG tablet, Take 1 tablet (5 mg total) by mouth daily., Disp: 90 tablet, Rfl: 3   aspirin  EC 81 MG tablet, Take 81 mg by mouth at bedtime., Disp: , Rfl:    atorvastatin  (LIPITOR ) 80 MG tablet, Take 80 mg by mouth at bedtime., Disp: , Rfl:    clopidogrel  (PLAVIX ) 75 MG tablet, Take 1 tablet (75 mg total) by mouth daily., Disp: 30 tablet, Rfl: 11   ezetimibe  (ZETIA ) 10 MG tablet, Take 1 tablet (10 mg total) by mouth daily., Disp: 90 tablet, Rfl: 3   Fe Fum-Vit C-Vit B12-FA (TRIGELS-F FORTE) CAPS capsule, Take 1 capsule by mouth daily., Disp: 30 capsule, Rfl: 0   metoprolol  tartrate (LOPRESSOR ) 25 MG tablet, Take 0.5 tablets (12.5 mg total) by mouth 2 (two) times daily., Disp: 90 tablet, Rfl: 3   Multiple Vitamin (MULTIVITAMIN) capsule, Take 2 capsules by mouth every morning. Gummy, Disp: , Rfl:    Omega-3 Fatty Acids (OMEGA 3 PO), Take 2 capsules by mouth daily. 454 mg each, Disp: , Rfl:    Probiotic Product (PROBIOTIC PO), Take 220 mg by mouth daily., Disp: , Rfl:    vitamin C  (ASCORBIC ACID ) 250 MG tablet, Take 500 mg by mouth daily., Disp: , Rfl:    zinc  sulfate 220 (50 Zn) MG capsule, Take 1 capsule (220 mg total) by mouth daily., Disp: , Rfl:   Past Medical History: Past Medical History:  Diagnosis Date   Carotid artery occlusion    Cellulitis     Erectile dysfunction    Hyperlipidemia    Hypertension    Peripheral vascular disease (HCC)     Tobacco Use: Social History   Tobacco Use  Smoking Status Never  Smokeless Tobacco Former   Types: Chew   Quit date: 01/01/2015    Labs: Review Flowsheet  More data exists      Latest Ref Rng & Units 10/28/2022 06/16/2023 06/22/2023 06/23/2023 06/24/2023  Labs for ITP Cardiac and Pulmonary Rehab  Cholestrol 0 - 200 mg/dL - 644  - - -  LDL (calc) 0 - 99 mg/dL - 70  - - -  HDL-C >03 mg/dL - 50  - - -  Trlycerides <150 mg/dL - 52  - - -  Hemoglobin A1c 4.8 - 5.6 % - 5.5  - 5.3  -  PH, Arterial 7.35 - 7.45 - - 7.42  - 7.302  7.232  7.473  7.474  7.447  7.528  7.383   PCO2 arterial 32 - 48 mmHg - - 39  - 40.0  44.2  25.6  32.9  34.5  28.4  40.9   Bicarbonate 20.0 - 28.0 mmol/L - - 25.3  - 19.5  18.6  19.0  24.2  23.9  23.3  23.6  24.4   TCO2 22 - 32 mmol/L 28  - - - 21  20  20  25  27  27  25  24  24  24  26  27    Acid-base deficit 0.0 - 2.0 mmol/L - - - - 6.0  8.0  4.0  1.0   O2 Saturation % - - 98.1  - 98  98  98  100  100  82  100  100     Details       Multiple values from one day are sorted in reverse-chronological order         Capillary Blood Glucose: Lab Results  Component Value Date   GLUCAP 135 (H) 06/27/2023   GLUCAP 100 (H) 06/27/2023   GLUCAP 99 06/27/2023   GLUCAP 95 06/26/2023   GLUCAP 138 (H) 06/26/2023     Exercise Target Goals: Exercise Program Goal: Individual exercise prescription set using results from initial 6 min walk test and THRR while considering  patient's activity barriers and safety.   Exercise Prescription Goal: Initial exercise prescription builds to 30-45 minutes a day of aerobic activity, 2-3 days per week.  Home exercise guidelines will be given to patient during program as part of exercise prescription that the participant will acknowledge.  Activity Barriers & Risk Stratification:  Activity Barriers & Cardiac Risk Stratification -  10/12/23 1359       Activity Barriers & Cardiac Risk Stratification   Activity Barriers Other (comment);Back Problems    Comments R BKA; sternal precautions    Cardiac Risk Stratification High   <5 METs on            6 Minute Walk:  6 Minute Walk     Row Name 10/12/23 1519         6 Minute Walk   Phase Initial     Distance 1270 feet     Walk Time 6 minutes     # of Rest Breaks 0     MPH 2.41     METS 3.23     RPE 12     Perceived Dyspnea  0     VO2 Peak 11.3     Symptoms Yes (comment)     Comments no s/sx or pain. back tightness     Resting HR 73 bpm     Resting BP 118/66     Resting Oxygen Saturation  97 %     Exercise Oxygen Saturation  during 6 min walk 97 %     Max Ex. HR 115 bpm     Max Ex. BP 154/70     2 Minute Post BP 126/64              Oxygen Initial Assessment:   Oxygen Re-Evaluation:   Oxygen Discharge (Final Oxygen Re-Evaluation):   Initial Exercise Prescription:  Initial Exercise Prescription - 10/12/23 1500       Date of Initial Exercise RX and Referring Provider   Date 10/12/23    Referring Provider Maeola Schmidt, MD    Expected Discharge Date 01/06/24      NuStep   Level 2    SPM 70    Minutes 15    METs 2.5      Recumbant Elliptical   Level 1    RPM 60    Watts 80    Minutes 15    METs 2.5      Prescription Details   Frequency (times per week) 3    Duration Progress to 30 minutes of continuous aerobic without signs/symptoms of physical distress  Intensity   THRR 40-80% of Max Heartrate 61-122    Ratings of Perceived Exertion 11-13    Perceived Dyspnea 0-4      Progression   Progression Continue progressive overload as per policy without signs/symptoms or physical distress.      Resistance Training   Training Prescription Yes    Weight 3    Reps 10-15             Perform Capillary Blood Glucose checks as needed.  Exercise Prescription Changes:   Exercise Prescription Changes     Row  Name 11/02/23 1600 11/18/23 0813           Response to Exercise   Blood Pressure (Admit) 128/60 106/62      Blood Pressure (Exercise) 120/52 130/74      Blood Pressure (Exit) 138/64 122/68      Heart Rate (Admit) 76 bpm 79 bpm      Heart Rate (Exercise) 100 bpm 96 bpm      Heart Rate (Exit) 81 bpm 86 bpm      Rating of Perceived Exertion (Exercise) 11.5 11.5      Perceived Dyspnea (Exercise) 0 0      Symptoms none none      Comments Pt first day in CRP2 Reviewed MET's and goals      Duration Continue with 30 min of aerobic exercise without signs/symptoms of physical distress. Continue with 30 min of aerobic exercise without signs/symptoms of physical distress.      Intensity THRR unchanged THRR unchanged        Progression   Progression Continue to progress workloads to maintain intensity without signs/symptoms of physical distress. Continue to progress workloads to maintain intensity without signs/symptoms of physical distress.      Average METs 2 1.85        Resistance Training   Training Prescription Yes No      Weight 3 3      Reps 10-15 10-15      Time 10 Minutes 10 Minutes        Interval Training   Interval Training No No        NuStep   Level 2 3      SPM 61 78      Minutes 15 15      METs 1.6 2        Arm Ergometer   Level -- 1      Watts -- 17      Minutes -- 15      METs -- 1.7        Recumbant Elliptical   Level 1 --      RPM 50 --      Watts 59 --      Minutes 15 --      METs 2.4 --               Exercise Comments:   Exercise Comments     Row Name 11/02/23 1639 11/18/23 0818         Exercise Comments Pt first day in [facility]2 orientation. Pt tolerated exercise well with an avg MET level of 2.0 with no s/sx. Pt is adjusting to exercise equipment motion with prosthesis and stated it felt a little strange, but not painful and okay to remain on assigned equipment. Pt is learning his THRR, RPE scale, and ExRx. Overall pt off to a great start.  Reveiwed MEt's and goals. Pt tolerated exercise well with an avg  MET level of 1.85 with no s/sx. Working together to find exercises that work for his prosthetic. He does feel and increase in strength and stamina. He's also able to do more yard work and feels good with his progress so far               Exercise Goals and Review:   Exercise Goals     Row Name 10/12/23 1359             Exercise Goals   Increase Physical Activity Yes       Intervention Provide advice, education, support and counseling about physical activity/exercise needs.;Develop an individualized exercise prescription for aerobic and resistive training based on initial evaluation findings, risk stratification, comorbidities and participant's personal goals.       Expected Outcomes Short Term: Attend rehab on a regular basis to increase amount of physical activity.;Long Term: Exercising regularly at least 3-5 days a week.;Long Term: Add in home exercise to make exercise part of routine and to increase amount of physical activity.       Increase Strength and Stamina Yes       Intervention Provide advice, education, support and counseling about physical activity/exercise needs.;Develop an individualized exercise prescription for aerobic and resistive training based on initial evaluation findings, risk stratification, comorbidities and participant's personal goals.       Expected Outcomes Short Term: Increase workloads from initial exercise prescription for resistance, speed, and METs.;Short Term: Perform resistance training exercises routinely during rehab and add in resistance training at home;Long Term: Improve cardiorespiratory fitness, muscular endurance and strength as measured by increased METs and functional capacity ( )       Able to understand and use rate of perceived exertion (RPE) scale Yes       Intervention Provide education and explanation on how to use RPE scale       Expected Outcomes Short Term: Able to  use RPE daily in rehab to express subjective intensity level;Long Term:  Able to use RPE to guide intensity level when exercising independently       Knowledge and understanding of Target Heart Rate Range (THRR) Yes       Intervention Provide education and explanation of THRR including how the numbers were predicted and where they are located for reference       Expected Outcomes Short Term: Able to state/look up THRR;Long Term: Able to use THRR to govern intensity when exercising independently;Short Term: Able to use daily as guideline for intensity in rehab       Understanding of Exercise Prescription Yes       Intervention Provide education, explanation, and written materials on patient's individual exercise prescription       Expected Outcomes Short Term: Able to explain program exercise prescription;Long Term: Able to explain home exercise prescription to exercise independently                Exercise Goals Re-Evaluation :  Exercise Goals Re-Evaluation     Row Name 11/02/23 1637 11/18/23 0816           Exercise Goal Re-Evaluation   Exercise Goals Review Increase Physical Activity;Understanding of Exercise Prescription;Increase Strength and Stamina;Knowledge and understanding of Target Heart Rate Range (THRR);Able to understand and use rate of perceived exertion (RPE) scale Increase Physical Activity;Understanding of Exercise Prescription;Increase Strength and Stamina;Knowledge and understanding of Target Heart Rate Range (THRR);Able to understand and use rate of perceived exertion (RPE) scale      Comments Pt first day  in CRP2 orientation. Pt tolerated exercise well with an avg MET level of 2.0 with no s/sx. Pt is adjusting to exercise equipment motion with prosthesis and stated it felt a little strange, but not painful and okay to remain on assigned equipment. Pt is learning his THRR, RPE scale, and ExRx. Overall pt off to a great start. Reveiwed MEt's and goals. Pt tolerated exercise  well with an avg MET level of 1.85 with no s/sx. Working together to find exercises that work for his prosthetic. He does feel and increase in strength and stamina. He's also able to do more yard work and feels good with his progress so far      Expected Outcomes Will continue to progress workloads as tolerated without s/sx. Will continue to progress workloads as tolerated without s/sx.               Discharge Exercise Prescription (Final Exercise Prescription Changes):  Exercise Prescription Changes - 11/18/23 0813       Response to Exercise   Blood Pressure (Admit) 106/62    Blood Pressure (Exercise) 130/74    Blood Pressure (Exit) 122/68    Heart Rate (Admit) 79 bpm    Heart Rate (Exercise) 96 bpm    Heart Rate (Exit) 86 bpm    Rating of Perceived Exertion (Exercise) 11.5    Perceived Dyspnea (Exercise) 0    Symptoms none    Comments Reviewed MET's and goals    Duration Continue with 30 min of aerobic exercise without signs/symptoms of physical distress.    Intensity THRR unchanged      Progression   Progression Continue to progress workloads to maintain intensity without signs/symptoms of physical distress.    Average METs 1.85      Resistance Training   Training Prescription No    Weight 3    Reps 10-15    Time 10 Minutes      Interval Training   Interval Training No      NuStep   Level 3    SPM 78    Minutes 15    METs 2      Arm Ergometer   Level 1    Watts 17    Minutes 15    METs 1.7             Nutrition:  Target Goals: Understanding of nutrition guidelines, daily intake of sodium 1500mg , cholesterol 200mg , calories 30% from fat and 7% or less from saturated fats, daily to have 5 or more servings of fruits and vegetables.  Biometrics:  Pre Biometrics - 10/12/23 1356       Pre Biometrics   Waist Circumference 42 inches    Hip Circumference 38 inches    Waist to Hip Ratio 1.11 %    Triceps Skinfold 26 mm    % Body Fat 31.2 %    Grip  Strength 38 kg    Flexibility 11.5 in    Single Leg Stand 2 seconds              Nutrition Therapy Plan and Nutrition Goals:  Nutrition Therapy & Goals - 11/02/23 0840       Nutrition Therapy   Diet Heart Healthy Diet    Drug/Food Interactions Statins/Certain Fruits      Personal Nutrition Goals   Nutrition Goal Patient to identify strategies for reducing cardiovascular risk by attending the [employer] education and nutrition series weekly.    Personal Goal #2 Patient to improve diet quality  by using the plate method as a guide for meal planning to include lean protein/plant protein, fruits, vegetables, whole grains, nonfat dairy as part of a well-balanced diet.    Personal Goal #3 Patient to identify strategies for weight loss of 0.5-2.0# per week.    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 Lipids and A1c are well controlled. He is motivated to lose weight and continue heart healthy diet for LDL improvement. Patient will benefit from participation in intensive cardiac rehab for nutrition, exercise, and lifestyle modification.      Intervention Plan   Intervention Prescribe, educate and counsel regarding individualized specific dietary modifications aiming towards targeted core components such as weight, hypertension, lipid management, diabetes, heart failure and other comorbidities.;Nutrition handout(s) given to patient.    Expected Outcomes Short Term Goal: Understand basic principles of dietary content, such as calories, fat, sodium, cholesterol and nutrients.;Long Term Goal: Adherence to prescribed nutrition plan.             Nutrition Assessments:  Nutrition Assessments - 10/29/23 0923       Rate Your Plate Scores   Pre Score 71            MEDIFICTS Score Key: >=70 Need to make dietary changes  40-70 Heart Healthy Diet <= 40 Therapeutic Level Cholesterol Diet   Flowsheet Row  INTENSIVE CARDIAC REHAB from 10/28/2023 in [facility]  Picture Your Plate Total Score on Admission 71      Picture Your Plate Scores: <27 Unhealthy dietary pattern with much room for improvement. 41-50 Dietary pattern unlikely to meet recommendations for good health and room for improvement. 51-60 More healthful dietary pattern, with some room for improvement.  >60 Healthy dietary pattern, although there may be some specific behaviors that could be improved.    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 Lipids and A1c are well controlled. He is motivated to lose weight and continue heart healthy diet for LDL improvement. Will continue to educate on strategies for weight loss including calorie density, the plate method, mindful eating.  Patient will benefit from participation in intensive cardiac rehab for nutrition, exercise, and lifestyle modification.                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 Lipids and A1c are well controlled. He is motivated to lose weight and continue heart healthy diet for LDL improvement. Will continue to educate on strategies for weight loss including calorie density, the plate method, mindful eating.  Patient will benefit from participation in  intensive cardiac rehab for nutrition, exercise, and lifestyle modification.                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 Lipids and A1c are well controlled. He is motivated to lose weight and continue heart healthy diet for LDL improvement. Will continue to educate on strategies for weight loss including calorie density, the plate method, mindful eating.  Patient will benefit from participation in intensive cardiac rehab for nutrition, exercise, and lifestyle modification.             Psychosocial: Target Goals: Acknowledge presence or absence of significant depression and/or stress, maximize coping skills, provide positive support system. Participant is able to verbalize types and ability to use techniques and skills needed for reducing stress and depression.  Initial Review & Psychosocial Screening:  Initial Psych Review & Screening - 11/26/23 1059       Initial Review   Current issues with None Identified      Family Dynamics   Good Support System? Yes   Wife and family for support     Barriers   Psychosocial barriers to participate in program There are no identifiable barriers or psychosocial needs.      Screening Interventions   Interventions Encouraged to exercise;Provide feedback about the scores to participant    Expected Outcomes Short Term goal: Identification and review with participant of any Quality of Life or Depression concerns found by scoring the questionnaire.;Long Term goal: The participant improves quality of Life and PHQ9 Scores as seen by post scores and/or verbalization of changes             Quality of Life  Scores:  Quality of Life - 10/12/23 1417       Quality of Life   Select Quality of Life      Quality of Life Scores   Health/Function Pre 21.17 %    Socioeconomic Pre 27 %    Psych/Spiritual Pre 21.21 %    Family Pre 24 %    GLOBAL Pre 22.79 %            Scores of 19 and below usually indicate a poorer quality of life in these areas.  A difference of  2-3 points is a clinically meaningful difference.  A difference of 2-3 points in the total score of the Quality of Life Index has been associated with significant improvement in overall quality of life, self-image, physical symptoms, and general health in studies assessing change in quality of life.  PHQ-9: Review Flowsheet       10/12/2023 03/20/2023 06/16/2022 04/08/2022  Depression screen PHQ 2/9  Decreased Interest 0 0 0 0  Down, Depressed, Hopeless 0 0 0 0  PHQ - 2 Score 0 0 0 0  Altered sleeping 0 - 0 -  Tired, decreased energy 0 - 0 -  Change in appetite 0 - 0 -  Feeling bad or failure about yourself  0 - 0 -  Trouble concentrating 0 - 0 -  Moving slowly or fidgety/restless 0 - 0 -  Suicidal thoughts 0 - 0 -  PHQ-9 Score 0 - 0 -  Interpretation of Total Score  Total Score Depression Severity:  1-4 = Minimal depression, 5-9 = Mild depression, 10-14 = Moderate depression, 15-19 = Moderately severe depression, 20-27 = Severe depression   Psychosocial Evaluation and Intervention:   Psychosocial Re-Evaluation:  Psychosocial Re-Evaluation     Row Name 11/06/23 1335 11/26/23 1059           Psychosocial Re-Evaluation   Current issues with None Identified None Identified      Interventions Encouraged to attend Cardiac Rehabilitation for the exercise Encouraged to attend Cardiac Rehabilitation for the exercise      Continue Psychosocial Services  No Follow up required No Follow up required               Psychosocial Discharge (Final Psychosocial Re-Evaluation):  Psychosocial Re-Evaluation - 11/26/23 1059        Psychosocial Re-Evaluation   Current issues with None Identified    Interventions Encouraged to attend Cardiac Rehabilitation for the exercise    Continue Psychosocial Services  No Follow up required             Vocational Rehabilitation: Provide vocational rehab assistance to qualifying candidates.   Vocational Rehab Evaluation & Intervention:  Vocational Rehab - 10/12/23 1404       Initial Vocational Rehab Evaluation & Intervention   Assessment shows need for Vocational Rehabilitation No   [place] is working            Education: Education Goals: Education classes will be provided on a weekly basis, covering required topics. Participant will state understanding/return demonstration of topics presented.    Education     Row Name 11/18/23 0800     Education   Cardiac Education Topics [employer]   Customer service manager   Weekly Topic [employer]- [employer] on the [employer]   Instruction Review Code 1- Verbalizes Understanding   Class Start Time 0815   Class Stop Time 0850   Class Time Calculation (min) 35 min            Core Videos: Exercise    Move It!  Clinical staff conducted group or individual video education with verbal and written material and guidebook.  Patient learns the recommended Pritikin exercise program. Exercise with the goal of living a long, healthy life. Some of the health benefits of exercise include controlled diabetes, healthier blood pressure levels, improved cholesterol levels, improved heart and lung capacity, improved sleep, and better body composition. Everyone should speak with their doctor before starting or changing an exercise routine.  Biomechanical Limitations Clinical staff conducted group or individual video education with verbal and written material and guidebook.  Patient learns how biomechanical limitations can impact exercise and how we can mitigate and possibly  overcome limitations to have an impactful and balanced exercise routine.  Body Composition Clinical staff conducted group or individual video education with verbal and written material and guidebook.  Patient learns that body composition (ratio of muscle mass to fat mass) is a key component to assessing overall fitness, rather than body weight alone. Increased fat mass, especially visceral belly fat, can put us  at increased risk for metabolic syndrome, type 2 diabetes, heart disease, and even death. It is recommended to combine diet and exercise (cardiovascular and resistance training) to improve your body composition. Seek guidance from your physician and exercise physiologist before implementing an exercise routine.  Exercise Action Plan Clinical staff conducted group or individual video education with verbal  and written material and guidebook.  Patient learns the recommended strategies to achieve and enjoy long-term exercise adherence, including variety, self-motivation, self-efficacy, and positive decision making. Benefits of exercise include fitness, good health, weight management, more energy, better sleep, less stress, and overall well-being.  Medical   Heart Disease Risk Reduction Clinical staff conducted group or individual video education with verbal and written material and guidebook.  Patient learns our heart is our most vital organ as it circulates oxygen, nutrients, white blood cells, and hormones throughout the entire body, and carries waste away. Data supports a plant-based eating plan like the Pritikin Program for its effectiveness in slowing progression of and reversing heart disease. The video provides a number of recommendations to address heart disease.   Metabolic Syndrome and Belly Fat  Clinical staff conducted group or individual video education with verbal and written material and guidebook.  Patient learns what metabolic syndrome is, how it leads to heart disease, and how  one can reverse it and keep it from coming back. You have metabolic syndrome if you have 3 of the following 5 criteria: abdominal obesity, high blood pressure, high triglycerides, low HDL cholesterol, and high blood sugar.  Hypertension and Heart Disease Clinical staff conducted group or individual video education with verbal and written material and guidebook.  Patient learns that high blood pressure, or hypertension, is very common in the [place] . Hypertension is largely due to excessive salt intake, but other important risk factors include being overweight, physical inactivity, drinking too much alcohol, smoking, and not eating enough potassium from fruits and vegetables. High blood pressure is a leading risk factor for heart attack, stroke, congestive heart failure, dementia, kidney failure, and premature death. Long-term effects of excessive salt intake include stiffening of the arteries and thickening of heart muscle and organ damage. Recommendations include ways to reduce hypertension and the risk of heart disease.  Diseases of Our Time - Focusing on Diabetes Clinical staff conducted group or individual video education with verbal and written material and guidebook.  Patient learns why the best way to stop diseases of our time is prevention, through food and other lifestyle changes. Medicine (such as prescription pills and surgeries) is often only a Band-Aid on the problem, not a long-term solution. Most common diseases of our time include obesity, type 2 diabetes, hypertension, heart disease, and cancer. The Pritikin Program is recommended and has been proven to help reduce, reverse, and/or prevent the damaging effects of metabolic syndrome.  Nutrition   Overview of the Pritikin Eating Plan  Clinical staff conducted group or individual video education with verbal and written material and guidebook.  Patient learns about the Pritikin Eating Plan for disease risk reduction. The Pritikin  Eating Plan emphasizes a wide variety of unrefined, minimally-processed carbohydrates, like fruits, vegetables, whole grains, and legumes. Go, Caution, and Stop food choices are explained. Plant-based and lean animal proteins are emphasized. Rationale provided for low sodium intake for blood pressure control, low added sugars for blood sugar stabilization, and low added fats and oils for coronary artery disease risk reduction and weight management.  Calorie Density  Clinical staff conducted group or individual video education with verbal and written material and guidebook.  Patient learns about calorie density and how it impacts the Pritikin Eating Plan. Knowing the characteristics of the food you choose will help you decide whether those foods will lead to weight gain or weight loss, and whether you want to consume more or less of them. Weight loss is usually  a side effect of the Pritikin Eating Plan because of its focus on low calorie-dense foods.  Label Reading  Clinical staff conducted group or individual video education with verbal and written material and guidebook.  Patient learns about the Pritikin recommended label reading guidelines and corresponding recommendations regarding calorie density, added sugars, sodium content, and whole grains.  Dining Out - Part 1  Clinical staff conducted group or individual video education with verbal and written material and guidebook.  Patient learns that restaurant meals can be sabotaging because they can be so high in calories, fat, sodium, and/or sugar. Patient learns recommended strategies on how to positively address this and avoid unhealthy pitfalls.  Facts on Fats  Clinical staff conducted group or individual video education with verbal and written material and guidebook.  Patient learns that lifestyle modifications can be just as effective, if not more so, as many medications for lowering your risk of heart disease. A Pritikin lifestyle can help to  reduce your risk of inflammation and atherosclerosis (cholesterol build-up, or plaque, in the artery walls). Lifestyle interventions such as dietary choices and physical activity address the cause of atherosclerosis. A review of the types of fats and their impact on blood cholesterol levels, along with dietary recommendations to reduce fat intake is also included.  Nutrition Action Plan  Clinical staff conducted group or individual video education with verbal and written material and guidebook.  Patient learns how to incorporate Pritikin recommendations into their lifestyle. Recommendations include planning and keeping personal health goals in mind as an important part of their success.  Healthy Mind-Set    Healthy Minds, Bodies, Hearts  Clinical staff conducted group or individual video education with verbal and written material and guidebook.  Patient learns how to identify when they are stressed. Video will discuss the impact of that stress, as well as the many benefits of stress management. Patient will also be introduced to stress management techniques. The way we think, act, and feel has an impact on our hearts.  How Our Thoughts Can Heal Our Hearts  Clinical staff conducted group or individual video education with verbal and written material and guidebook.  Patient learns that negative thoughts can cause depression and anxiety. This can result in negative lifestyle behavior and serious health problems. Cognitive behavioral therapy is an effective method to help control our thoughts in order to change and improve our emotional outlook.  Additional Videos:  Exercise    Improving Performance  Clinical staff conducted group or individual video education with verbal and written material and guidebook.  Patient learns to use a non-linear approach by alternating intensity levels and lengths of time spent exercising to help burn more calories and lose more body fat. Cardiovascular exercise helps  improve heart health, metabolism, hormonal balance, blood sugar control, and recovery from fatigue. Resistance training improves strength, endurance, balance, coordination, reaction time, metabolism, and muscle mass. Flexibility exercise improves circulation, posture, and balance. Seek guidance from your physician and exercise physiologist before implementing an exercise routine and learn your capabilities and proper form for all exercise.  Introduction to Yoga  Clinical staff conducted group or individual video education with verbal and written material and guidebook.  Patient learns about yoga, a discipline of the coming together of mind, breath, and body. The benefits of yoga include improved flexibility, improved range of motion, better posture and core strength, increased lung function, weight loss, and positive self-image. Yoga's heart health benefits include lowered blood pressure, healthier heart rate, decreased cholesterol and triglyceride  levels, improved immune function, and reduced stress. Seek guidance from your physician and exercise physiologist before implementing an exercise routine and learn your capabilities and proper form for all exercise.  Medical   Aging: Enhancing Your Quality of Life  Clinical staff conducted group or individual video education with verbal and written material and guidebook.  Patient learns key strategies and recommendations to stay in good physical health and enhance quality of life, such as prevention strategies, having an advocate, securing a Health Care Proxy and Power of Attorney, and keeping a list of medications and system for tracking them. It also discusses how to avoid risk for bone loss.  Biology of Weight Control  Clinical staff conducted group or individual video education with verbal and written material and guidebook.  Patient learns that weight gain occurs because we consume more calories than we burn (eating more, moving less). Even if your body  weight is normal, you may have higher ratios of fat compared to muscle mass. Too much body fat puts you at increased risk for cardiovascular disease, heart attack, stroke, type 2 diabetes, and obesity-related cancers. In addition to exercise, following the Pritikin Eating Plan can help reduce your risk.  Decoding Lab Results  Clinical staff conducted group or individual video education with verbal and written material and guidebook.  Patient learns that lab test reflects one measurement whose values change over time and are influenced by many factors, including medication, stress, sleep, exercise, food, hydration, pre-existing medical conditions, and more. It is recommended to use the knowledge from this video to become more involved with your lab results and evaluate your numbers to speak with your doctor.   Diseases of Our Time - Overview  Clinical staff conducted group or individual video education with verbal and written material and guidebook.  Patient learns that according to the CDC, 50% to 70% of chronic diseases (such as obesity, type 2 diabetes, elevated lipids, hypertension, and heart disease) are avoidable through lifestyle improvements including healthier food choices, listening to satiety cues, and increased physical activity.  Sleep Disorders Clinical staff conducted group or individual video education with verbal and written material and guidebook.  Patient learns how good quality and duration of sleep are important to overall health and well-being. Patient also learns about sleep disorders and how they impact health along with recommendations to address them, including discussing with a physician.  Nutrition  Dining Out - Part 2 Clinical staff conducted group or individual video education with verbal and written material and guidebook.  Patient learns how to plan ahead and communicate in order to maximize their dining experience in a healthy and nutritious manner. Included are  recommended food choices based on the type of restaurant the patient is visiting.   Fueling a Banker conducted group or individual video education with verbal and written material and guidebook.  There is a strong connection between our food choices and our health. Diseases like obesity and type 2 diabetes are very prevalent and are in large-part due to lifestyle choices. The Pritikin Eating Plan provides plenty of food and hunger-curbing satisfaction. It is easy to follow, affordable, and helps reduce health risks.  Menu Workshop  Clinical staff conducted group or individual video education with verbal and written material and guidebook.  Patient learns that restaurant meals can sabotage health goals because they are often packed with calories, fat, sodium, and sugar. Recommendations include strategies to plan ahead and to communicate with the manager, chef, or server to  help order a healthier meal.  Planning Your Eating Strategy  Clinical staff conducted group or individual video education with verbal and written material and guidebook.  Patient learns about the Pritikin Eating Plan and its benefit of reducing the risk of disease. The Pritikin Eating Plan does not focus on calories. Instead, it emphasizes high-quality, nutrient-rich foods. By knowing the characteristics of the foods, we choose, we can determine their calorie density and make informed decisions.  Targeting Your Nutrition Priorities  Clinical staff conducted group or individual video education with verbal and written material and guidebook.  Patient learns that lifestyle habits have a tremendous impact on disease risk and progression. This video provides eating and physical activity recommendations based on your personal health goals, such as reducing LDL cholesterol, losing weight, preventing or controlling type 2 diabetes, and reducing high blood pressure.  Vitamins and Minerals  Clinical staff conducted  group or individual video education with verbal and written material and guidebook.  Patient learns different ways to obtain key vitamins and minerals, including through a recommended healthy diet. It is important to discuss all supplements you take with your doctor.   Healthy Mind-Set    Smoking Cessation  Clinical staff conducted group or individual video education with verbal and written material and guidebook.  Patient learns that cigarette smoking and tobacco addiction pose a serious health risk which affects millions of people. Stopping smoking will significantly reduce the risk of heart disease, lung disease, and many forms of cancer. Recommended strategies for quitting are covered, including working with your doctor to develop a successful plan.  Culinary   Becoming a Set designer conducted group or individual video education with verbal and written material and guidebook.  Patient learns that cooking at home can be healthy, cost-effective, quick, and puts them in control. Keys to cooking healthy recipes will include looking at your recipe, assessing your equipment needs, planning ahead, making it simple, choosing cost-effective seasonal ingredients, and limiting the use of added fats, salts, and sugars.  Cooking - Breakfast and Snacks  Clinical staff conducted group or individual video education with verbal and written material and guidebook.  Patient learns how important breakfast is to satiety and nutrition through the entire day. Recommendations include key foods to eat during breakfast to help stabilize blood sugar levels and to prevent overeating at meals later in the day. Planning ahead is also a key component.  Cooking - Educational psychologist conducted group or individual video education with verbal and written material and guidebook.  Patient learns eating strategies to improve overall health, including an approach to cook more at home. Recommendations  include thinking of animal protein as a side on your plate rather than center stage and focusing instead on lower calorie dense options like vegetables, fruits, whole grains, and plant-based proteins, such as beans. Making sauces in large quantities to freeze for later and leaving the skin on your vegetables are also recommended to maximize your experience.  Cooking - Healthy Salads and Dressing Clinical staff conducted group or individual video education with verbal and written material and guidebook.  Patient learns that vegetables, fruits, whole grains, and legumes are the foundations of the Pritikin Eating Plan. Recommendations include how to incorporate each of these in flavorful and healthy salads, and how to create homemade salad dressings. Proper handling of ingredients is also covered. Cooking - Soups and [employer] - Soups and Desserts Clinical staff conducted group or individual video education with  verbal and written material and guidebook.  Patient learns that Pritikin soups and desserts make for easy, nutritious, and delicious snacks and meal components that are low in sodium, fat, sugar, and calorie density, while high in vitamins, minerals, and filling fiber. Recommendations include simple and healthy ideas for soups and desserts.   Overview     The Pritikin Solution Program Overview Clinical staff conducted group or individual video education with verbal and written material and guidebook.  Patient learns that the results of the Pritikin Program have been documented in more than 100 articles published in peer-reviewed journals, and the benefits include reducing risk factors for (and, in some cases, even reversing) high cholesterol, high blood pressure, type 2 diabetes, obesity, and more! An overview of the three key pillars of the Pritikin Program will be covered: eating well, doing regular exercise, and having a healthy mind-set.  WORKSHOPS  Exercise: Exercise Basics:  Building Your Action Plan Clinical staff led group instruction and group discussion with PowerPoint presentation and patient guidebook. To enhance the learning environment the use of posters, models and videos may be added. At the conclusion of this workshop, patients will comprehend the difference between physical activity and exercise, as well as the benefits of incorporating both, into their routine. Patients will understand the FITT (Frequency, Intensity, Time, and Type) principle and how to use it to build an exercise action plan. In addition, safety concerns and other considerations for exercise and cardiac rehab will be addressed by the presenter. The purpose of this lesson is to promote a comprehensive and effective weekly exercise routine in order to improve patients' overall level of fitness.   Managing Heart Disease: Your Path to a Healthier Heart Clinical staff led group instruction and group discussion with PowerPoint presentation and patient guidebook. To enhance the learning environment the use of posters, models and videos may be added.At the conclusion of this workshop, patients will understand the anatomy and physiology of the heart. Additionally, they will understand how Pritikin's three pillars impact the risk factors, the progression, and the management of heart disease.  The purpose of this lesson is to provide a high-level overview of the heart, heart disease, and how the Pritikin lifestyle positively impacts risk factors.  Exercise Biomechanics Clinical staff led group instruction and group discussion with PowerPoint presentation and patient guidebook. To enhance the learning environment the use of posters, models and videos may be added. Patients will learn how the structural parts of their bodies function and how these functions impact their daily activities, movement, and exercise. Patients will learn how to promote a neutral spine, learn how to manage pain, and identify  ways to improve their physical movement in order to promote healthy living. The purpose of this lesson is to expose patients to common physical limitations that impact physical activity. Participants will learn practical ways to adapt and manage aches and pains, and to minimize their effect on regular exercise. Patients will learn how to maintain good posture while sitting, walking, and lifting.  Balance Training and Fall Prevention  Clinical staff led group instruction and group discussion with PowerPoint presentation and patient guidebook. To enhance the learning environment the use of posters, models and videos may be added. At the conclusion of this workshop, patients will understand the importance of their sensorimotor skills (vision, proprioception, and the vestibular system) in maintaining their ability to balance as they age. Patients will apply a variety of balancing exercises that are appropriate for their current level of function. Patients will  understand the common causes for poor balance, possible solutions to these problems, and ways to modify their physical environment in order to minimize their fall risk. The purpose of this lesson is to teach patients about the importance of maintaining balance as they age and ways to minimize their risk of falling.  WORKSHOPS   Nutrition:  Fueling a Ship broker led group instruction and group discussion with PowerPoint presentation and patient guidebook. To enhance the learning environment the use of posters, models and videos may be added. Patients will review the foundational principles of the Pritikin Eating Plan and understand what constitutes a serving size in each of the food groups. Patients will also learn Pritikin-friendly foods that are better choices when away from home and review make-ahead meal and snack options. Calorie density will be reviewed and applied to three nutrition priorities: weight maintenance, weight  loss, and weight gain. The purpose of this lesson is to reinforce (in a group setting) the key concepts around what patients are recommended to eat and how to apply these guidelines when away from home by planning and selecting Pritikin-friendly options. Patients will understand how calorie density may be adjusted for different weight management goals.  Mindful Eating  Clinical staff led group instruction and group discussion with PowerPoint presentation and patient guidebook. To enhance the learning environment the use of posters, models and videos may be added. Patients will briefly review the concepts of the Pritikin Eating Plan and the importance of low-calorie dense foods. The concept of mindful eating will be introduced as well as the importance of paying attention to internal hunger signals. Triggers for non-hunger eating and techniques for dealing with triggers will be explored. The purpose of this lesson is to provide patients with the opportunity to review the basic principles of the Pritikin Eating Plan, discuss the value of eating mindfully and how to measure internal cues of hunger and fullness using the Hunger Scale. Patients will also discuss reasons for non-hunger eating and learn strategies to use for controlling emotional eating.  Targeting Your Nutrition Priorities Clinical staff led group instruction and group discussion with PowerPoint presentation and patient guidebook. To enhance the learning environment the use of posters, models and videos may be added. Patients will learn how to determine their genetic susceptibility to disease by reviewing their family history. Patients will gain insight into the importance of diet as part of an overall healthy lifestyle in mitigating the impact of genetics and other environmental insults. The purpose of this lesson is to provide patients with the opportunity to assess their personal nutrition priorities by looking at their family history, their own  health history and current risk factors. Patients will also be able to discuss ways of prioritizing and modifying the Pritikin Eating Plan for their highest risk areas  Menu  Clinical staff led group instruction and group discussion with PowerPoint presentation and patient guidebook. To enhance the learning environment the use of posters, models and videos may be added. Using menus brought in from [employer], or printed from [employer], patients will apply the Pritikin dining out guidelines that were presented in the [employer] video. Patients will also be able to practice these guidelines in a variety of provided scenarios. The purpose of this lesson is to provide patients with the opportunity to practice hands-on learning of the Pritikin Dining Out guidelines with actual menus and practice scenarios.  Label Reading Clinical staff led group instruction and group discussion with PowerPoint presentation and  patient guidebook. To enhance the learning environment the use of posters, models and videos may be added. Patients will review and discuss the Pritikin label reading guidelines presented in Pritikin's Label Reading Educational series video. Using fool labels brought in from local grocery stores and markets, patients will apply the label reading guidelines and determine if the packaged food meet the Pritikin guidelines. The purpose of this lesson is to provide patients with the opportunity to review, discuss, and practice hands-on learning of the Pritikin Label Reading guidelines with actual packaged food labels. Cooking School  Pritikin's [employer] are designed to teach patients ways to prepare quick, simple, and affordable recipes at home. The importance of nutrition's role in chronic disease risk reduction is reflected in its emphasis in the overall Pritikin program. By learning how to prepare essential core Pritikin Eating Plan recipes, patients will  increase control over what they eat; be able to customize the flavor of foods without the use of added salt, sugar, or fat; and improve the quality of the food they consume. By learning a set of core recipes which are easily assembled, quickly prepared, and affordable, patients are more likely to prepare more healthy foods at home. These workshops focus on convenient breakfasts, simple entres, side dishes, and desserts which can be prepared with minimal effort and are consistent with nutrition recommendations for cardiovascular risk reduction. Cooking [employer] are taught by a Armed forces logistics/support/administrative officer (RD) who has been trained by the [employer]. The chef or RD has a clear understanding of the importance of minimizing - if not completely eliminating - added fat, sugar, and sodium in recipes. Throughout the series of Cooking School Workshop sessions, patients will learn about healthy ingredients and efficient methods of cooking to build confidence in their capability to prepare    Cooking School weekly topics:  Adding Flavor- Sodium-Free  Fast and Healthy Breakfasts  Powerhouse Plant-Based Proteins  Satisfying Salads and Dressings  Simple Sides and Sauces  International Cuisine-Spotlight on the [employer] Zones  Delicious Desserts  Savory Soups  [employer] - Meals in a Astronomer Appetizers and Snacks  Comforting Weekend Breakfasts  One-Pot Wonders   Fast Evening Meals  Landscape architect Your Pritikin Plate  WORKSHOPS   Healthy Mindset (Psychosocial):  Focused Goals, Sustainable Changes Clinical staff led group instruction and group discussion with PowerPoint presentation and patient guidebook. To enhance the learning environment the use of posters, models and videos may be added. Patients will be able to apply effective goal setting strategies to establish at least one personal goal, and then take consistent, meaningful action toward that goal.  They will learn to identify common barriers to achieving personal goals and develop strategies to overcome them. Patients will also gain an understanding of how our mind-set can impact our ability to achieve goals and the importance of cultivating a positive and growth-oriented mind-set. The purpose of this lesson is to provide patients with a deeper understanding of how to set and achieve personal goals, as well as the tools and strategies needed to overcome common obstacles which may arise along the way.  From Head to Heart: The Power of a Healthy Outlook  Clinical staff led group instruction and group discussion with PowerPoint presentation and patient guidebook. To enhance the learning environment the use of posters, models and videos may be added. Patients will be able to recognize and describe the impact of emotions and mood on physical health. They will discover the  importance of self-care and explore self-care practices which may work for them. Patients will also learn how to utilize the 4 C's to cultivate a healthier outlook and better manage stress and challenges. The purpose of this lesson is to demonstrate to patients how a healthy outlook is an essential part of maintaining good health, especially as they continue their cardiac rehab journey.  Healthy Sleep for a Healthy Heart Clinical staff led group instruction and group discussion with PowerPoint presentation and patient guidebook. To enhance the learning environment the use of posters, models and videos may be added. At the conclusion of this workshop, patients will be able to demonstrate knowledge of the importance of sleep to overall health, well-being, and quality of life. They will understand the symptoms of, and treatments for, common sleep disorders. Patients will also be able to identify daytime and nighttime behaviors which impact sleep, and they will be able to apply these tools to help manage sleep-related challenges. The purpose of  this lesson is to provide patients with a general overview of sleep and outline the importance of quality sleep. Patients will learn about a few of the most common sleep disorders. Patients will also be introduced to the concept of "sleep hygiene," and discover ways to self-manage certain sleeping problems through simple daily behavior changes. Finally, the workshop will motivate patients by clarifying the links between quality sleep and their goals of heart-healthy living.   Recognizing and Reducing Stress Clinical staff led group instruction and group discussion with PowerPoint presentation and patient guidebook. To enhance the learning environment the use of posters, models and videos may be added. At the conclusion of this workshop, patients will be able to understand the types of stress reactions, differentiate between acute and chronic stress, and recognize the impact that chronic stress has on their health. They will also be able to apply different coping mechanisms, such as reframing negative self-talk. Patients will have the opportunity to practice a variety of stress management techniques, such as deep abdominal breathing, progressive muscle relaxation, and/or guided imagery.  The purpose of this lesson is to educate patients on the role of stress in their lives and to provide healthy techniques for coping with it.  Learning Barriers/Preferences:  Learning Barriers/Preferences - 10/12/23 1404       Learning Barriers/Preferences   Learning Barriers Sight   reading glasses   Learning Preferences Audio;Computer/Internet;Group Instruction;Individual Instruction;Skilled Demonstration;Verbal Instruction;Video;Written Material;Pictoral             Education Topics:  Knowledge Questionnaire Score:  Knowledge Questionnaire Score - 10/12/23 1404       Knowledge Questionnaire Score   Pre Score 21/24             Core Components/Risk Factors/Patient Goals at Admission:  Personal Goals  and Risk Factors at Admission - 10/12/23 1404       Core Components/Risk Factors/Patient Goals on Admission    Weight Management Yes;Weight Loss    Intervention Weight Management: Provide education and appropriate resources to help participant work on and attain dietary goals.;Weight Management: Develop a combined nutrition and exercise program designed to reach desired caloric intake, while maintaining appropriate intake of nutrient and fiber, sodium and fats, and appropriate energy expenditure required for the weight goal.    Admit Weight 205 lb 0.4 oz (93 kg)    Goal Weight: Long Term 187 lb (84.8 kg)   pt goal   Expected Outcomes Short Term: Continue to assess and modify interventions until short term weight is achieved;Long  Term: Adherence to nutrition and physical activity/exercise program aimed toward attainment of established weight goal;Weight Loss: Understanding of general recommendations for a balanced deficit meal plan, which promotes 1-2 lb weight loss per week and includes a negative energy balance of [phone] kcal/d;Understanding recommendations for meals to include 15-35% energy as protein, 25-35% energy from fat, 35-60% energy from carbohydrates, less than 200mg  of dietary cholesterol, 20-35 gm of total fiber daily;Understanding of distribution of calorie intake throughout the day with the consumption of 4-5 meals/snacks    Hypertension Yes    Intervention Provide education on lifestyle modifcations including regular physical activity/exercise, weight management, moderate sodium restriction and increased consumption of fresh fruit, vegetables, and low fat dairy, alcohol moderation, and smoking cessation.;Monitor prescription use compliance.    Expected Outcomes Short Term: Continued assessment and intervention until BP is < 140/26mm HG in hypertensive participants. < 130/65mm HG in hypertensive participants with diabetes, heart failure or chronic kidney disease.;Long Term: Maintenance of  blood pressure at goal levels.    Lipids Yes    Intervention Provide education and support for participant on nutrition & aerobic/resistive exercise along with prescribed medications to achieve LDL 70mg , HDL >40mg .    Expected Outcomes Short Term: Participant states understanding of desired cholesterol values and is compliant with medications prescribed. Participant is following exercise prescription and nutrition guidelines.;Long Term: Cholesterol controlled with medications as prescribed, with individualized exercise RX and with personalized nutrition plan. Value goals: LDL < 70mg , HDL > 40 mg.             Core Components/Risk Factors/Patient Goals Review:   Goals and Risk Factor Review     Row Name 11/06/23 1335 11/26/23 1100           Core Components/Risk Factors/Patient Goals Review   Personal Goals Review Weight Management/Obesity;Hypertension;Lipids Weight Management/Obesity;Hypertension;Lipids      Review Dennard started cardiac rehab on 11/02/23. Bluford is off to a good start to exercise. Vital signs have been stable. Danton is doing well well with exercise. Vital signs have been stable.Chimaobi has lost 1.9 kg since starting cardiac rehab. intermittent PVC's noted. Asymptomatic. Will continue to monitor.      Expected Outcomes Danie has lost 2.4 kg since starting cardiac rehab. Jaran will continue to participate in cardiac rehab for exercise nutrtion and lifestyle modifications               Core Components/Risk Factors/Patient Goals at Discharge (Final Review):   Goals and Risk Factor Review - 11/26/23 1100       Core Components/Risk Factors/Patient Goals Review   Personal Goals Review Weight Management/Obesity;Hypertension;Lipids    Review Alverto is doing well well with exercise. Vital signs have been stable.Creig has lost 1.9 kg since starting cardiac rehab. intermittent PVC's noted. Asymptomatic. Will continue to monitor.    Expected Outcomes Ashwath will  continue to participate in cardiac rehab for exercise nutrtion and lifestyle modifications             ITP Comments:  ITP Comments     Row Name 10/12/23 1355 11/06/23 1334 11/30/23 0843       ITP Comments Dr. Gaylyn Keas medical director. Introduction to pritikin education/intensive cardiac rehab. Initial orientation packet reviewed with patient. 30 Day ITP Review. Xavyer started cardiac rehab on 11/02/23 30 Day ITP Review. Aniketh has good attendance and participation with exercise at cardiac rehab              Comments: See ITP comment

## 2023-11-30 NOTE — Progress Notes (Signed)
 @Patient  ID: Clinton [name], male    DOB: [birth year], 68 y.o.   MRN: [id number]  Chief Complaint  Patient presents with   Consult    Pt states possible nods in lungs     Referring provider: Lanae Pinal, MD  HPI:   68 y.o. man whom we are seeing for evaluation of abnormal CT images notably calcified lymphadenopathy, hilar and mediastinal.  Multiple cardiology notes reviewed.  Patient has chronic findings of similar abnormalities on imaging.  Most recent cross-sectional CT coronary in 2023 demonstrates this.  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  Denies any cough or shortness of breath.  Never been biopsied.  Never really been told some issue.  We discussed at length possible sarcoidosis.  Understood pathophysiology.  Needs of treatment if needed.  Based on symptoms.  Discussed role and rationale for biopsy would be needed if he were to pursue treatment.  Currently discussed he does not meet any criteria suggest symptoms.  Notably his parenchyma on CT scan on/2020 reviewed was clear not demonstrating any signs of inflammation etc. that could be attributed to sarcoidosis.  Questionaires / Pulmonary Flowsheets:   ACT:      No data to display          MMRC:     No data to display          Epworth:      No data to display          Tests:   FENO:  No results found for: "NITRICOXIDE"  PFT:     No data to display          WALK:      No data to display          Imaging: No results found.  Lab Results: Personally reviewed CBC    Component Value Date/Time   WBC 6.6 06/27/2023 0356   RBC 2.57 (L) 06/27/2023 0356   HGB 8.5 (L) 06/27/2023 0356   HGB 13.4 06/09/2023 1035   HCT 25.4 (L) 06/27/2023 0356   HCT 40.4 06/09/2023 1035   PLT 154 06/27/2023 0356   PLT 257 06/09/2023 1035   MCV 98.8 06/27/2023 0356   MCV 98 (H) 06/09/2023 1035   MCH 33.1 06/27/2023 0356   MCHC 33.5 06/27/2023 0356   RDW 13.2  06/27/2023 0356   RDW 12.5 06/09/2023 1035   LYMPHSABS 1.5 06/20/2022 1145   MONOABS 0.5 06/20/2022 1145   EOSABS 0.1 06/20/2022 1145   BASOSABS 0.0 06/20/2022 1145    BMET    Component Value Date/Time   NA 136 06/28/2023 0311   NA 141 06/09/2023 1035   K 4.0 06/28/2023 0311   CL 104 06/28/2023 0311   CO2 25 06/28/2023 0311   GLUCOSE 99 06/28/2023 0311   BUN 18 06/28/2023 0311   BUN 13 06/09/2023 1035   CREATININE 1.35 (H) 06/28/2023 0311   CREATININE 1.24 04/08/2022 1621   CALCIUM  8.2 (L) 06/28/2023 0311   GFRNONAA 57 (L) 06/28/2023 0311   GFRAA >60 09/06/2015 0445    BNP No results found for: "BNP"  ProBNP No results found for: "PROBNP"  Specialty Problems       Pulmonary Problems   Acute postoperative respiratory insufficiency    Allergies  Allergen Reactions   Penicillins Rash    Has patient had a PCN reaction causing immediate rash, facial/tongue/throat swelling, SOB or lightheadedness with hypotension: Yes Has patient had a PCN reaction causing severe rash  involving mucus membranes or skin necrosis: No Has patient had a PCN reaction that required hospitalization No Has patient had a PCN reaction occurring within the last 10 years: No If all of the above answers are "NO", then may proceed with Cephalosporin use.  Tolerated Cefazolin  on 04/16/22    Sulfa  Antibiotics Swelling and Other (See Comments)    Facial/lip swelling    Immunization History  Administered Date(s) Administered   Fluad Quad(high Dose 65+) 05/15/2022   Hepatitis A, Adult 04/20/2015, 10/19/2015   Influenza Inj Mdck Quad Pf 05/08/2018   Influenza Split 04/27/2010, 05/21/2012, 06/26/2014, 06/01/2015, 05/20/2020, 05/09/2021   Influenza,inj,Quad PF,6+ Mos 05/28/2016, 04/26/2017, 05/11/2017, 04/09/2019   PFIZER(Purple Top)SARS-COV-2 Vaccination 10/26/2019, 11/16/2019, 06/03/2020   Pfizer Covid-19 Vaccine Bivalent Booster 2yrs & up 05/09/2021   Pneumococcal Polysaccharide-23 08/30/2020    Tdap 11/12/2005, 05/28/2016   Zoster, Live 08/23/2019, 12/19/2019    Past Medical History:  Diagnosis Date   Carotid artery occlusion    Cellulitis    Erectile dysfunction    Hyperlipidemia    Hypertension    Peripheral vascular disease (HCC)     Tobacco History: Social History   Tobacco Use  Smoking Status Never  Smokeless Tobacco Former   Types: Chew   Quit date: 01/01/2015   Counseling given: Not Answered   Continue to not smoke  Outpatient Encounter Medications as of 11/10/2023  Medication Sig   amLODipine  (NORVASC ) 5 MG tablet Take 1 tablet (5 mg total) by mouth daily.   aspirin  EC 81 MG tablet Take 81 mg by mouth at bedtime.   atorvastatin  (LIPITOR ) 80 MG tablet Take 80 mg by mouth at bedtime.   clopidogrel  (PLAVIX ) 75 MG tablet Take 1 tablet (75 mg total) by mouth daily.   ezetimibe  (ZETIA ) 10 MG tablet Take 1 tablet (10 mg total) by mouth daily.   Fe Fum-Vit C-Vit B12-FA (TRIGELS-F FORTE) CAPS capsule Take 1 capsule by mouth daily.   metoprolol  tartrate (LOPRESSOR ) 25 MG tablet Take 0.5 tablets (12.5 mg total) by mouth 2 (two) times daily.   Multiple Vitamin (MULTIVITAMIN) capsule Take 2 capsules by mouth every morning. Gummy   Omega-3 Fatty Acids (OMEGA 3 PO) Take 2 capsules by mouth daily. 454 mg each   Probiotic Product (PROBIOTIC PO) Take 220 mg by mouth daily.   vitamin C  (ASCORBIC ACID ) 250 MG tablet Take 500 mg by mouth daily.   zinc  sulfate 220 (50 Zn) MG capsule Take 1 capsule (220 mg total) by mouth daily.   No facility-administered encounter medications on file as of 11/10/2023.     Review of Systems  Review of Systems  No chest pain with exertion.  No orthopnea or PND.  Comprehensive review of system otherwise negative. Physical Exam  BP (!) 162/74 (BP Location: Right Arm, Patient Position: Sitting, Cuff Size: Normal)   Pulse 81   Ht 5\' 10"  (1.778 m)   Wt 201 lb (91.2 kg)   SpO2 99%   BMI 28.84 kg/m   Wt Readings from Last 5 Encounters:   11/10/23 201 lb (91.2 kg)  10/21/23 202 lb 6.4 oz (91.8 kg)  10/12/23 205 lb 0.4 oz (93 kg)  07/16/23 201 lb 12.8 oz (91.5 kg)  06/29/23 208 lb 15.9 oz (94.8 kg)    BMI Readings from Last 5 Encounters:  11/10/23 28.84 kg/m  10/21/23 29.04 kg/m  10/12/23 29.42 kg/m  07/16/23 28.96 kg/m  06/29/23 29.99 kg/m     Physical Exam General: Sitting in chair, no acute distress Eyes:  EOMI, no icterus Neck: Supple, no JVP Pulmonary: Clear, normal work of breathing Cardiovascular: Regular rate and rhythm, warm Abdomen: Nondistended MSK: No synovitis, no joint effusion Neuro: Normal gait, no weakness Psych: Normal mood, full affect  Assessment & Plan:   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  Calcifications, enlarged lymph nodes with complications hilar and mediastinal.  Parenchyma is clear.  No dyspnea or cough to suggest symptoms of sarcoidosis.  As such no recommendation for diagnosis or treatment at this time.  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  I discussed with his cardiologist and this was felt to be chronic and likely unrelated.  As such, it seems allergy feels cardiac sarcoid is unlikely therefore role for biopsy again is limited.  Repeat CT scan November 2025, 2-year interval from prior CT scan in 2023 that prompted the referral to assess for any interval changes in the lungs.  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   Return in about 8 months (around 07/11/2024) for f/u Dr. Marygrace Snellen.   Guerry Leek, MD 11/30/2023   This appointment required 48 minutes of patient care (this includes precharting, chart review, review of results, face-to-face care, etc.).

## 2023-12-02 ENCOUNTER — Ambulatory Visit (HOSPITAL_COMMUNITY): Payer: Commercial Managed Care - PPO

## 2023-12-02 ENCOUNTER — Encounter (HOSPITAL_COMMUNITY)

## 2023-12-04 ENCOUNTER — Ambulatory Visit (HOSPITAL_COMMUNITY): Payer: Commercial Managed Care - PPO

## 2023-12-04 ENCOUNTER — Encounter (HOSPITAL_COMMUNITY)

## 2023-12-07 ENCOUNTER — Encounter (HOSPITAL_COMMUNITY)
Admission: RE | Admit: 2023-12-07 | Discharge: 2023-12-07 | Disposition: A | Discharge status: Home / Self Care | Source: Ambulatory Visit | Attending: Cardiovascular Disease | Admitting: Cardiovascular Disease

## 2023-12-07 ENCOUNTER — Ambulatory Visit (HOSPITAL_COMMUNITY)
Admission: RE | Admit: 2023-12-07 | Discharge: 2023-12-07 | Disposition: A | Discharge status: Home / Self Care | Payer: 59 | Source: Ambulatory Visit | Attending: Surgery | Admitting: Surgery

## 2023-12-07 ENCOUNTER — Encounter: Payer: Self-pay | Admitting: Surgery

## 2023-12-07 ENCOUNTER — Ambulatory Visit: Payer: 59 | Attending: Surgery | Admitting: Surgery

## 2023-12-07 ENCOUNTER — Ambulatory Visit (HOSPITAL_BASED_OUTPATIENT_CLINIC_OR_DEPARTMENT_OTHER)
Admission: RE | Admit: 2023-12-07 | Discharge: 2023-12-07 | Disposition: A | Discharge status: Home / Self Care | Payer: 59 | Source: Ambulatory Visit | Attending: Surgery | Admitting: Surgery

## 2023-12-07 VITALS — BP 150/75 | HR 70 | Temp 97.9°F | Ht 70.0 in | Wt 203.0 lb

## 2023-12-07 DIAGNOSIS — Z951 Presence of aortocoronary bypass graft: Secondary | ICD-10-CM | POA: Diagnosis not present

## 2023-12-07 DIAGNOSIS — I739 Peripheral vascular disease, unspecified: Secondary | ICD-10-CM

## 2023-12-07 DIAGNOSIS — I7025 Atherosclerosis of native arteries of other extremities with ulceration: Secondary | ICD-10-CM | POA: Diagnosis not present

## 2023-12-07 LAB — VAS US ABI WITH/WO TBI: Left ABI: 0.65

## 2023-12-07 NOTE — Progress Notes (Signed)
 [facility] [place]  Patient name: Clinton [name] MRN: [id number] DOB: 23-Mar-1956 Sex: male   REASON FOR VISIT:    Follow-up  HISOTRY OF PRESENT ILLNESS:   Clinton [name] is a 68 y.o. male who has undergone the following procedures:     09/05/2015: Right carotid endarterectomy (asymptomatic, Dr. Nolene Baumgarten) 02/04/2022: DCB, right popliteal artery, PTA right anterior tibial artery (ulcers, Dr. Charlotte Cookey) 02/18/2022: DCB, left popliteal artery, PTA, left peroneal artery, atherectomy, left popliteal and peroneal artery (ulcers, Tahiry Spicer) 04/15/2022: Angioplasty, right popliteal artery, angioplasty, right anterior tibial artery (ulcer, Oryon Gary) 04/16/2022: Right fifth toe amputation Julio Ohm) 05/09/2022: Right below-knee amputation Julio Ohm) 06/20/2022: Revision right below-knee amputation Julio Ohm) 10/28/2022: Left below-knee popliteal artery shockwave lithotripsy and drug-coated balloon angioplasty.  Posterior tibial angioplasty (ulcer)     He is now walking with a prosthesis.  There are no wounds on his left foot.  He is on dual antiplatelet therapy and a statin.  His most recent ultrasound showed no significant stenosis.  He is back today for surveillance imaging.  He sees Dr. Chanetta Comes he periodically for his toenails   PAST MEDICAL HISTORY:   Past Medical History:  Diagnosis Date   Carotid artery occlusion    Cellulitis    Erectile dysfunction    Hyperlipidemia    Hypertension    Peripheral vascular disease (HCC)      FAMILY HISTORY:   Family History  Problem Relation Age of Onset   Other Mother        circulatory problems   Heart attack Mother    Deep vein thrombosis Mother    Heart disease Mother    Hyperlipidemia Father    Hypertension Father    Diabetes Father    Deep vein thrombosis Father    Heart disease Father        before age 60   Heart attack Father    Peripheral vascular disease Father    Hypertension  Brother    Hyperlipidemia Brother     SOCIAL HISTORY:   Social History   Tobacco Use   Smoking status: Never   Smokeless tobacco: Former    Types: Chew    Quit date: 01/01/2015  Substance Use Topics   Alcohol use: Not Currently     ALLERGIES:   Allergies  Allergen Reactions   Penicillins Rash    Has patient had a PCN reaction causing immediate rash, facial/tongue/throat swelling, SOB or lightheadedness with hypotension: Yes Has patient had a PCN reaction causing severe rash involving mucus membranes or skin necrosis: No Has patient had a PCN reaction that required hospitalization No Has patient had a PCN reaction occurring within the last 10 years: No If all of the above answers are "NO", then may proceed with Cephalosporin use.  Tolerated Cefazolin  on 04/16/22    Sulfa  Antibiotics Swelling and Other (See Comments)    Facial/lip swelling     CURRENT MEDICATIONS:   Current Outpatient Medications  Medication Sig Dispense Refill   aspirin  EC 81 MG tablet Take 81 mg by mouth at bedtime.     atorvastatin  (LIPITOR ) 80 MG tablet Take 80 mg by mouth at bedtime.     clopidogrel  (PLAVIX ) 75 MG tablet Take 1 tablet (75 mg total) by mouth daily. 30 tablet 11   ezetimibe  (ZETIA ) 10 MG tablet Take 1 tablet (10 mg total) by mouth daily. 90 tablet 3   Fe Fum-Vit C-Vit B12-FA (TRIGELS-F FORTE) CAPS capsule Take 1 capsule by mouth daily. 30 capsule  0   metoprolol  tartrate (LOPRESSOR ) 25 MG tablet Take 0.5 tablets (12.5 mg total) by mouth 2 (two) times daily. 90 tablet 3   Multiple Vitamin (MULTIVITAMIN) capsule Take 2 capsules by mouth every morning. Gummy     Omega-3 Fatty Acids (OMEGA 3 PO) Take 2 capsules by mouth daily. 454 mg each     Probiotic Product (PROBIOTIC PO) Take 220 mg by mouth daily.     vitamin C  (ASCORBIC ACID ) 250 MG tablet Take 500 mg by mouth daily.     zinc  sulfate 220 (50 Zn) MG capsule Take 1 capsule (220 mg total) by mouth daily.     amLODipine  (NORVASC ) 5 MG  tablet Take 1 tablet (5 mg total) by mouth daily. 90 tablet 3   No current facility-administered medications for this visit.    REVIEW OF SYSTEMS:   [X]  denotes positive finding, [ ]  denotes negative finding Cardiac  Comments:  Chest pain or chest pressure:    Shortness of breath upon exertion:    Short of breath when lying flat:    Irregular heart rhythm:        Vascular    Pain in calf, thigh, or hip brought on by ambulation:    Pain in feet at night that wakes you up from your sleep:     Blood clot in your veins:    Leg swelling:         Pulmonary    Oxygen at home:    Productive cough:     Wheezing:         Neurologic    Sudden weakness in arms or legs:     Sudden numbness in arms or legs:     Sudden onset of difficulty speaking or slurred speech:    Temporary loss of vision in one eye:     Problems with dizziness:         Gastrointestinal    Blood in stool:     Vomited blood:         Genitourinary    Burning when urinating:     Blood in urine:        Psychiatric    Major depression:         Hematologic    Bleeding problems:    Problems with blood clotting too easily:        Skin    Rashes or ulcers:        Constitutional    Fever or chills:      PHYSICAL EXAM:   Vitals:   12/07/23 1420  BP: (!) 150/75  Pulse: 70  Temp: 97.9 F (36.6 C)  SpO2: 96%  Weight: 203 lb (92.1 kg)  Height: 5\' 10"  (1.778 m)    GENERAL: The patient is a well-nourished male, in no acute distress. The vital signs are documented above. CARDIAC: There is a regular rate and rhythm.  VASCULAR: Nonpalpable pedal pulses PULMONARY: Non-labored respirations MUSCULOSKELETAL: There are no major deformities or cyanosis. NEUROLOGIC: No focal weakness or paresthesias are detected. SKIN: There are no ulcers or rashes noted. PSYCHIATRIC: The patient has a normal affect.  STUDIES:   I have reviewed the following: ABI/TBIToday's ABIToday's TBIPrevious ABIPrevious TBI   +-------+-----------+-----------+------------+------------+  Right BKA        BKA        BKA         BKA           +-------+-----------+-----------+------------+------------+  Left  .65        .  48        1.18        .31           +-------+-----------+-----------+------------+------------+  Left great toe pressure: 75, monophasic waveforms   Left: Mild to moderate progression is noted compared to previous study.   Atherosclerosis throughout without evidence of stenosis from the common  femoral artery through the tibioperoneal trunk.  Zero vessel run-off; total PTA occlusion, mid peroneal artery occlusion  and distal ATA occlusion.  MEDICAL ISSUES:   PAD: The patient has a drop in his ABIs and evidence of tibial occlusion.  This is not terribly unexpected given the severity of his disease.  Fortunately, he is asymptomatic.  He does not have leg pain.  He does not have any open wounds.  He is back working full-time and traveling.  No interventions recommended at this time.  He knows to contact me if he has any changes in his foot.  Otherwise I will see him back in 1 year for repeat ABIs.    Marti Slates, MD, FACS [facility] [phone] Pager [phone]

## 2023-12-09 ENCOUNTER — Encounter (HOSPITAL_COMMUNITY)
Admission: RE | Admit: 2023-12-09 | Discharge: 2023-12-09 | Disposition: A | Discharge status: Home / Self Care | Source: Ambulatory Visit | Attending: Cardiovascular Disease | Admitting: Cardiovascular Disease

## 2023-12-09 ENCOUNTER — Ambulatory Visit (HOSPITAL_COMMUNITY): Payer: Commercial Managed Care - PPO

## 2023-12-09 ENCOUNTER — Encounter: Payer: Commercial Managed Care - PPO | Admitting: Physical Medicine and Rehabilitation

## 2023-12-09 DIAGNOSIS — Z951 Presence of aortocoronary bypass graft: Secondary | ICD-10-CM

## 2023-12-11 ENCOUNTER — Encounter (HOSPITAL_COMMUNITY)
Admission: RE | Admit: 2023-12-11 | Discharge: 2023-12-11 | Disposition: A | Discharge status: Home / Self Care | Source: Ambulatory Visit | Attending: Cardiovascular Disease | Admitting: Cardiovascular Disease

## 2023-12-11 ENCOUNTER — Ambulatory Visit (HOSPITAL_COMMUNITY): Payer: Commercial Managed Care - PPO

## 2023-12-11 DIAGNOSIS — Z951 Presence of aortocoronary bypass graft: Secondary | ICD-10-CM | POA: Insufficient documentation

## 2023-12-14 ENCOUNTER — Ambulatory Visit (HOSPITAL_COMMUNITY): Payer: Commercial Managed Care - PPO

## 2023-12-14 ENCOUNTER — Encounter (HOSPITAL_COMMUNITY)
Admission: RE | Admit: 2023-12-14 | Discharge: 2023-12-14 | Disposition: A | Discharge status: Home / Self Care | Source: Ambulatory Visit | Attending: Cardiovascular Disease | Admitting: Cardiovascular Disease

## 2023-12-14 DIAGNOSIS — Z951 Presence of aortocoronary bypass graft: Secondary | ICD-10-CM | POA: Diagnosis not present

## 2023-12-16 ENCOUNTER — Ambulatory Visit (HOSPITAL_COMMUNITY): Payer: Commercial Managed Care - PPO

## 2023-12-16 ENCOUNTER — Encounter (HOSPITAL_COMMUNITY)

## 2023-12-18 ENCOUNTER — Ambulatory Visit (HOSPITAL_COMMUNITY): Payer: Commercial Managed Care - PPO

## 2023-12-18 ENCOUNTER — Telehealth (HOSPITAL_COMMUNITY): Payer: Self-pay | Admitting: *Deleted

## 2023-12-18 ENCOUNTER — Encounter (HOSPITAL_COMMUNITY): Admission: RE | Admit: 2023-12-18 | Source: Ambulatory Visit

## 2023-12-21 ENCOUNTER — Ambulatory Visit (HOSPITAL_COMMUNITY): Payer: Commercial Managed Care - PPO

## 2023-12-21 ENCOUNTER — Encounter (HOSPITAL_COMMUNITY)
Admission: RE | Admit: 2023-12-21 | Discharge: 2023-12-21 | Disposition: A | Discharge status: Home / Self Care | Source: Ambulatory Visit | Attending: Cardiovascular Disease | Admitting: Cardiovascular Disease

## 2023-12-21 DIAGNOSIS — Z951 Presence of aortocoronary bypass graft: Secondary | ICD-10-CM

## 2023-12-23 ENCOUNTER — Encounter (HOSPITAL_COMMUNITY)
Admission: RE | Admit: 2023-12-23 | Discharge: 2023-12-23 | Disposition: A | Discharge status: Home / Self Care | Source: Ambulatory Visit | Attending: Cardiovascular Disease | Admitting: Cardiovascular Disease

## 2023-12-23 ENCOUNTER — Ambulatory Visit (HOSPITAL_COMMUNITY): Payer: Commercial Managed Care - PPO

## 2023-12-23 DIAGNOSIS — Z951 Presence of aortocoronary bypass graft: Secondary | ICD-10-CM | POA: Diagnosis not present

## 2023-12-25 ENCOUNTER — Ambulatory Visit (HOSPITAL_COMMUNITY): Payer: Commercial Managed Care - PPO

## 2023-12-25 ENCOUNTER — Encounter (HOSPITAL_COMMUNITY)

## 2023-12-28 ENCOUNTER — Encounter (HOSPITAL_COMMUNITY)
Admission: RE | Admit: 2023-12-28 | Discharge: 2023-12-28 | Disposition: A | Discharge status: Home / Self Care | Source: Ambulatory Visit | Attending: Cardiovascular Disease | Admitting: Cardiovascular Disease

## 2023-12-28 ENCOUNTER — Ambulatory Visit (HOSPITAL_COMMUNITY): Payer: Commercial Managed Care - PPO

## 2023-12-28 DIAGNOSIS — Z951 Presence of aortocoronary bypass graft: Secondary | ICD-10-CM

## 2023-12-28 NOTE — Progress Notes (Signed)
 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  Patient's Home Medications on Admission:  Current Outpatient Medications:    amLODipine  (NORVASC ) 5 MG tablet, Take 1 tablet (5 mg total) by mouth daily., Disp: 90 tablet, Rfl: 3   aspirin  EC 81 MG tablet, Take 81 mg by mouth at bedtime., Disp: , Rfl:    atorvastatin  (LIPITOR ) 80 MG tablet, Take 80 mg by mouth at bedtime., Disp: , Rfl:    clopidogrel  (PLAVIX ) 75 MG tablet, Take 1 tablet (75 mg total) by mouth daily., Disp: 30 tablet, Rfl: 11   ezetimibe  (ZETIA ) 10 MG tablet, Take 1 tablet (10 mg total) by mouth daily., Disp: 90 tablet, Rfl: 3   Fe Fum-Vit C-Vit B12-FA (TRIGELS-F FORTE) CAPS capsule, Take 1 capsule by mouth daily., Disp: 30 capsule, Rfl: 0   metoprolol  tartrate (LOPRESSOR ) 25 MG tablet, Take 0.5 tablets (12.5 mg total) by mouth 2 (two) times daily., Disp: 90 tablet, Rfl: 3   Multiple Vitamin (MULTIVITAMIN) capsule, Take 2 capsules by mouth every morning. Gummy, Disp: , Rfl:    Omega-3 Fatty Acids (OMEGA 3 PO), Take 2 capsules by mouth daily. 454 mg each, Disp: , Rfl:    Probiotic Product (PROBIOTIC PO), Take 220 mg by mouth daily., Disp: , Rfl:    vitamin C  (ASCORBIC ACID ) 250 MG tablet, Take 500 mg by mouth daily., Disp: , Rfl:    zinc  sulfate 220 (50 Zn) MG capsule, Take 1 capsule (220 mg total) by mouth daily., Disp: , Rfl:   Past Medical History: Past Medical History:  Diagnosis Date   Carotid artery occlusion    Cellulitis     Erectile dysfunction    Hyperlipidemia    Hypertension    Peripheral vascular disease (HCC)     Tobacco Use: Social History   Tobacco Use  Smoking Status Never  Smokeless Tobacco Former   Types: Chew   Quit date: 01/01/2015    Labs: Review Flowsheet  More data exists      Latest Ref Rng & Units 10/28/2022 06/16/2023 06/22/2023 06/23/2023 06/24/2023  Labs for ITP Cardiac and Pulmonary Rehab  Cholestrol 0 - 200 mg/dL - 409  - - -  LDL (calc) 0 - 99 mg/dL - 70  - - -  HDL-C >81 mg/dL - 50  - - -  Trlycerides <150 mg/dL - 52  - - -  Hemoglobin A1c 4.8 - 5.6 % - 5.5  - 5.3  -  PH, Arterial 7.35 - 7.45 - - 7.42  - 7.302  7.232  7.473  7.474  7.447  7.528  7.383   PCO2 arterial 32 - 48 mmHg - - 39  - 40.0  44.2  25.6  32.9  34.5  28.4  40.9   Bicarbonate 20.0 - 28.0 mmol/L - - 25.3  - 19.5  18.6  19.0  24.2  23.9  23.3  23.6  24.4   TCO2 22 - 32 mmol/L 28  - - - 21  20  20  25  27  27  25  24  24  24  26  27    Acid-base deficit 0.0 - 2.0 mmol/L - - - - 6.0  8.0  4.0  1.0   O2 Saturation % - - 98.1  - 98  98  98  100  100  82  100  100     Details       Multiple values from one day are sorted in reverse-chronological order         Capillary Blood Glucose: Lab Results  Component Value Date   GLUCAP 135 (H) 06/27/2023   GLUCAP 100 (H) 06/27/2023   GLUCAP 99 06/27/2023   GLUCAP 95 06/26/2023   GLUCAP 138 (H) 06/26/2023     Exercise Target Goals: Exercise Program Goal: Individual exercise prescription set using results from initial 6 min walk test and THRR while considering  patient's activity barriers and safety.   Exercise Prescription Goal: Initial exercise prescription builds to 30-45 minutes a day of aerobic activity, 2-3 days per week.  Home exercise guidelines will be given to patient during program as part of exercise prescription that the participant will acknowledge.  Activity Barriers & Risk Stratification:  Activity Barriers & Cardiac Risk Stratification -  10/12/23 1359       Activity Barriers & Cardiac Risk Stratification   Activity Barriers Other (comment);Back Problems    Comments R BKA; sternal precautions    Cardiac Risk Stratification High   <5 METs on            6 Minute Walk:  6 Minute Walk     Row Name 10/12/23 1519         6 Minute Walk   Phase Initial     Distance 1270 feet     Walk Time 6 minutes     # of Rest Breaks 0     MPH 2.41     METS 3.23     RPE 12     Perceived Dyspnea  0     VO2 Peak 11.3     Symptoms Yes (comment)     Comments no s/sx or pain. back tightness     Resting HR 73 bpm     Resting BP 118/66     Resting Oxygen Saturation  97 %     Exercise Oxygen Saturation  during 6 min walk 97 %     Max Ex. HR 115 bpm     Max Ex. BP 154/70     2 Minute Post BP 126/64              Oxygen Initial Assessment:   Oxygen Re-Evaluation:   Oxygen Discharge (Final Oxygen Re-Evaluation):   Initial Exercise Prescription:  Initial Exercise Prescription - 10/12/23 1500       Date of Initial Exercise RX and Referring Provider   Date 10/12/23    Referring Provider Maeola Schmidt, MD    Expected Discharge Date 01/06/24      NuStep   Level 2    SPM 70    Minutes 15    METs 2.5      Recumbant Elliptical   Level 1    RPM 60    Watts 80    Minutes 15    METs 2.5      Prescription Details   Frequency (times per week) 3    Duration Progress to 30 minutes of continuous aerobic without signs/symptoms of physical distress  Intensity   THRR 40-80% of Max Heartrate 61-122    Ratings of Perceived Exertion 11-13    Perceived Dyspnea 0-4      Progression   Progression Continue progressive overload as per policy without signs/symptoms or physical distress.      Resistance Training   Training Prescription Yes    Weight 3    Reps 10-15             Perform Capillary Blood Glucose checks as needed.  Exercise Prescription Changes:   Exercise Prescription Changes     Row  Name 11/02/23 1600 11/18/23 0813 12/07/23 0830 12/28/23 0821       Response to Exercise   Blood Pressure (Admit) 128/60 106/62 116/60 122/64    Blood Pressure (Exercise) 120/52 130/74 132/68 --  Over 10 sessions no ExBP needed    Blood Pressure (Exit) 138/64 122/68 118/56 116/66    Heart Rate (Admit) 76 bpm 79 bpm 68 bpm 64 bpm    Heart Rate (Exercise) 100 bpm 96 bpm 94 bpm 109 bpm    Heart Rate (Exit) 81 bpm 86 bpm 77 bpm 73 bpm    Rating of Perceived Exertion (Exercise) 11.5 11.5 11.5 12.5    Perceived Dyspnea (Exercise) 0 0 0 0    Symptoms none none none none    Comments Pt first day in CRP2 Reviewed MET's and goals Reviewed MET's and goals Reviewed MET's and goals    Duration Continue with 30 min of aerobic exercise without signs/symptoms of physical distress. Continue with 30 min of aerobic exercise without signs/symptoms of physical distress. Continue with 30 min of aerobic exercise without signs/symptoms of physical distress. Continue with 30 min of aerobic exercise without signs/symptoms of physical distress.    Intensity THRR unchanged THRR unchanged THRR unchanged THRR unchanged      Progression   Progression Continue to progress workloads to maintain intensity without signs/symptoms of physical distress. Continue to progress workloads to maintain intensity without signs/symptoms of physical distress. Continue to progress workloads to maintain intensity without signs/symptoms of physical distress. Continue to progress workloads to maintain intensity without signs/symptoms of physical distress.    Average METs 2 1.85 1.95 2.18      Resistance Training   Training Prescription Yes No No Yes    Weight 3 3 4 4     Reps 10-15 10-15 10-15 10-15    Time 10 Minutes 10 Minutes 10 Minutes 10 Minutes      Interval Training   Interval Training No No No No      NuStep   Level 2 3 3 3     SPM 61 78 85 87    Minutes 15 15 15 15     METs 1.6 2 2  1.9      Arm Ergometer   Level -- 1 1 3.6     Watts -- 17 18 28     Minutes -- 15 51 15    METs -- 1.7 1.9 2.3      Recumbant Elliptical   Level 1 -- -- --    RPM 50 -- -- --    Watts 59 -- -- --    Minutes 15 -- -- --    METs 2.4 -- -- --             Exercise Comments:   Exercise Comments     Row Name 11/02/23 1639 11/18/23 0818 12/07/23 0838 12/28/23 0828     Exercise Comments Pt first day in [facility]2 orientation.  Pt tolerated exercise well with an avg MET level of 2.0 with no s/sx. Pt is adjusting to exercise equipment motion with prosthesis and stated it felt a little strange, but not painful and okay to remain on assigned equipment. Pt is learning his THRR, RPE scale, and ExRx. Overall pt off to a great start. Reveiwed MEt's and goals. Pt tolerated exercise well with an avg MET level of 1.85 with no s/sx. Working together to find exercises that work for his prosthetic. He does feel and increase in strength and stamina. He's also able to do more yard work and feels good with his progress so far Reveiwed MEt's and goals. Pt tolerated exercise well with an avg MET level of 1.95 with no s/sx. Pt would like to continue exercise on his own at [employer], but plans on traveling and is getting 3 days with us , so does not was to restart membership yet. Talked about making sure to restart at [employer] before graduation so he has a routine he feels comfortable with his prosthetic before he graduates. He's feeling good with his progress and the modality switch to the arm ergo has been helpful in increasing UB strength. This will help him as he continues his yardwork at home Reveiwed MET's and goals. Pt tolerated exercise well with an avg MET level of 2.35 with no s/sx.Pt is feeling good with his progress and is increasing strength, stamina and is able to do more around the house and in the yard. He is traveling a lot right now, so he said his diet has been off, but they are working onit while at home             Exercise Goals and  Review:   Exercise Goals     Row Name 10/12/23 1359             Exercise Goals   Increase Physical Activity Yes       Intervention Provide advice, education, support and counseling about physical activity/exercise needs.;Develop an individualized exercise prescription for aerobic and resistive training based on initial evaluation findings, risk stratification, comorbidities and participant's personal goals.       Expected Outcomes Short Term: Attend rehab on a regular basis to increase amount of physical activity.;Long Term: Exercising regularly at least 3-5 days a week.;Long Term: Add in home exercise to make exercise part of routine and to increase amount of physical activity.       Increase Strength and Stamina Yes       Intervention Provide advice, education, support and counseling about physical activity/exercise needs.;Develop an individualized exercise prescription for aerobic and resistive training based on initial evaluation findings, risk stratification, comorbidities and participant's personal goals.       Expected Outcomes Short Term: Increase workloads from initial exercise prescription for resistance, speed, and METs.;Short Term: Perform resistance training exercises routinely during rehab and add in resistance training at home;Long Term: Improve cardiorespiratory fitness, muscular endurance and strength as measured by increased METs and functional capacity ( )       Able to understand and use rate of perceived exertion (RPE) scale Yes       Intervention Provide education and explanation on how to use RPE scale       Expected Outcomes Short Term: Able to use RPE daily in rehab to express subjective intensity level;Long Term:  Able to use RPE to guide intensity level when exercising independently       Knowledge and understanding of Target Heart  Rate Range (THRR) Yes       Intervention Provide education and explanation of THRR including how the numbers were predicted and where  they are located for reference       Expected Outcomes Short Term: Able to state/look up THRR;Long Term: Able to use THRR to govern intensity when exercising independently;Short Term: Able to use daily as guideline for intensity in rehab       Understanding of Exercise Prescription Yes       Intervention Provide education, explanation, and written materials on patient's individual exercise prescription       Expected Outcomes Short Term: Able to explain program exercise prescription;Long Term: Able to explain home exercise prescription to exercise independently                Exercise Goals Re-Evaluation :  Exercise Goals Re-Evaluation     Row Name 11/02/23 1637 11/18/23 0816 12/07/23 0834 12/28/23 0825       Exercise Goal Re-Evaluation   Exercise Goals Review Increase Physical Activity;Understanding of Exercise Prescription;Increase Strength and Stamina;Knowledge and understanding of Target Heart Rate Range (THRR);Able to understand and use rate of perceived exertion (RPE) scale Increase Physical Activity;Understanding of Exercise Prescription;Increase Strength and Stamina;Knowledge and understanding of Target Heart Rate Range (THRR);Able to understand and use rate of perceived exertion (RPE) scale Increase Physical Activity;Understanding of Exercise Prescription;Increase Strength and Stamina;Knowledge and understanding of Target Heart Rate Range (THRR);Able to understand and use rate of perceived exertion (RPE) scale Increase Physical Activity;Understanding of Exercise Prescription;Increase Strength and Stamina;Knowledge and understanding of Target Heart Rate Range (THRR);Able to understand and use rate of perceived exertion (RPE) scale    Comments Pt first day in [facility]2 orientation. Pt tolerated exercise well with an avg MET level of 2.0 with no s/sx. Pt is adjusting to exercise equipment motion with prosthesis and stated it felt a little strange, but not painful and okay to remain on assigned  equipment. Pt is learning his THRR, RPE scale, and ExRx. Overall pt off to a great start. Reveiwed MEt's and goals. Pt tolerated exercise well with an avg MET level of 1.85 with no s/sx. Working together to find exercises that work for his prosthetic. He does feel and increase in strength and stamina. He's also able to do more yard work and feels good with his progress so far Reveiwed MEt's and goals. Pt tolerated exercise well with an avg MET level of 1.95 with no s/sx. Pt would like to continue exercise on his own at [employer], but plans on traveling and is getting 3 days with us , so does not was to restart membership yet. Talked about making sure to restart at [employer] before graduation so he has a routine he feels comfortable with his prosthetic before he graduates. He's feeling good with his progress and the modality switch to the arm ergo has been helpful in increasing UB strength. This will help him as he continues his yardwork at home Reveiwed MET's and goals. Pt tolerated exercise well with an avg MET level of 2.35 with no s/sx.Pt is feeling good with his progress and is increasing strength, stamina and is able to do more around the house and in the yard. He is traveling a lot right now, so he said his diet has been off, but they are working onit while at home    Expected Outcomes Will continue to progress workloads as tolerated without s/sx. Will continue to progress workloads as tolerated without s/sx. Will continue to progress  workloads as tolerated without s/sx. Will continue to progress workloads as tolerated without s/sx.             Discharge Exercise Prescription (Final Exercise Prescription Changes):  Exercise Prescription Changes - 12/28/23 0821       Response to Exercise   Blood Pressure (Admit) 122/64    Blood Pressure (Exercise) --   Over 10 sessions no ExBP needed   Blood Pressure (Exit) 116/66    Heart Rate (Admit) 64 bpm    Heart Rate (Exercise) 109 bpm    Heart  Rate (Exit) 73 bpm    Rating of Perceived Exertion (Exercise) 12.5    Perceived Dyspnea (Exercise) 0    Symptoms none    Comments Reviewed MET's and goals    Duration Continue with 30 min of aerobic exercise without signs/symptoms of physical distress.    Intensity THRR unchanged      Progression   Progression Continue to progress workloads to maintain intensity without signs/symptoms of physical distress.    Average METs 2.18      Resistance Training   Training Prescription Yes    Weight 4    Reps 10-15    Time 10 Minutes      Interval Training   Interval Training No      NuStep   Level 3    SPM 87    Minutes 15    METs 1.9      Arm Ergometer   Level 3.6    Watts 28    Minutes 15    METs 2.3             Nutrition:  Target Goals: Understanding of nutrition guidelines, daily intake of sodium 1500mg , cholesterol 200mg , calories 30% from fat and 7% or less from saturated fats, daily to have 5 or more servings of fruits and vegetables.  Biometrics:  Pre Biometrics - 10/12/23 1356       Pre Biometrics   Waist Circumference 42 inches    Hip Circumference 38 inches    Waist to Hip Ratio 1.11 %    Triceps Skinfold 26 mm    % Body Fat 31.2 %    Grip Strength 38 kg    Flexibility 11.5 in    Single Leg Stand 2 seconds              Nutrition Therapy Plan and Nutrition Goals:  Nutrition Therapy & Goals - 12/04/23 0954       Nutrition Therapy   Diet Heart Healthy Diet    Drug/Food Interactions Statins/Certain Fruits      Personal Nutrition Goals   Nutrition Goal Patient to identify strategies for reducing cardiovascular risk by attending the [employer] education and nutrition series weekly.   goal not met at this time.   Personal Goal #2 Patient to improve diet quality by using the plate method as a guide for meal planning to include lean protein/plant protein, fruits, vegetables, whole grains, nonfat dairy as part of a well-balanced diet.   goal in  progress.   Personal Goal #3 Patient to identify strategies for weight loss of 0.5-2.0# per week.   goal in progress.   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 Lipids and A1c are well controlled. He is motivated to lose weight and continue heart healthy diet for LDL improvement. He does not regularly attend the [facility] education/nutrition series. He is down 1.3# since starting with our program.  Patient will benefit from participation in intensive cardiac rehab for nutrition, exercise, and lifestyle modification.      Intervention Plan   Intervention Prescribe, educate and counsel regarding individualized specific dietary modifications aiming towards targeted core components such as weight, hypertension, lipid management, diabetes, heart failure and other comorbidities.;Nutrition handout(s) given to patient.    Expected Outcomes Short Term Goal: Understand basic principles of dietary content, such as calories, fat, sodium, cholesterol and nutrients.;Long Term Goal: Adherence to prescribed nutrition plan.             Nutrition Assessments:  Nutrition Assessments - 10/29/23 0923       Rate Your Plate Scores   Pre Score 71            MEDIFICTS Score Key: >=70 Need to make dietary changes  40-70 Heart Healthy Diet <= 40 Therapeutic Level Cholesterol Diet   Flowsheet Row INTENSIVE CARDIAC REHAB from 10/28/2023 in [facility]  Picture Your Plate Total Score on Admission 71      Picture Your Plate Scores: <91 Unhealthy dietary pattern with much room for improvement. 41-50 Dietary pattern unlikely to meet recommendations for good health and room for improvement. 51-60 More healthful dietary pattern, with some room for improvement.  >60 Healthy dietary pattern, although there may be some specific behaviors that could be improved.    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 Lipids and A1c are well controlled. He is motivated to lose weight and continue heart healthy diet for LDL improvement. Will continue to educate on strategies for weight loss including calorie density, the plate method, mindful eating.  Patient will benefit from participation in intensive cardiac rehab for nutrition, exercise, and lifestyle modification. 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 Lipids and A1c are well controlled. He is motivated to lose weight and continue heart healthy diet for LDL improvement. He does not regularly attend the [facility] education/nutrition series. He is down 1.3# since starting with our program. Patient will benefit from participation in intensive cardiac rehab for nutrition, exercise, and lifestyle modification.               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 Lipids and A1c are well controlled. He is motivated to lose weight and continue heart healthy diet for LDL  improvement. Will continue to educate on strategies for weight loss including calorie density, the plate method, mindful eating.  Patient will benefit from participation in intensive cardiac rehab for nutrition, exercise, and lifestyle modification. 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 Lipids and A1c are well controlled. He is motivated to lose weight and continue heart healthy diet for LDL improvement. He does not regularly attend the [facility] education/nutrition series. He is down 1.3# since starting with our program. Patient will benefit from participation in intensive cardiac rehab for nutrition, exercise, and lifestyle modification.               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 Lipids and A1c are well controlled. He is motivated to lose weight and continue heart healthy diet for LDL improvement. He does not regularly attend the [employer] education/nutrition series. He is down 1.3# since starting with our program. Patient will benefit from participation in intensive cardiac rehab for nutrition, exercise, and lifestyle modification.             Psychosocial: Target Goals: Acknowledge presence or absence of significant depression and/or stress, maximize coping skills, provide positive support system. Participant is able to verbalize types and ability to use techniques and skills needed for reducing stress and depression.  Initial Review & Psychosocial Screening:  Initial Psych Review & Screening - 11/26/23 1059       Initial Review   Current issues  with None Identified      Family Dynamics   Good Support System? Yes   Wife and family for support     Barriers   Psychosocial barriers to participate in program There are no identifiable barriers or psychosocial needs.      Screening Interventions   Interventions Encouraged to exercise;Provide feedback about the scores to participant    Expected Outcomes Short Term goal: Identification and review with participant of any Quality of Life or Depression concerns found by scoring the questionnaire.;Long Term goal: The participant improves quality of Life and PHQ9 Scores as seen by post scores and/or verbalization of changes             Quality of Life Scores:  Quality of Life - 10/12/23 1417       Quality of Life   Select Quality of Life      Quality of Life Scores   Health/Function Pre 21.17 %    Socioeconomic Pre 27 %    Psych/Spiritual Pre 21.21 %    Family Pre 24 %    GLOBAL Pre 22.79 %            Scores of 19 and below usually indicate a poorer quality of life in these areas.  A difference of  2-3 points is a clinically meaningful difference.  A difference of 2-3 points in the total score of the Quality of Life Index has been associated with significant improvement in overall quality of life, self-image, physical symptoms, and general health in studies assessing change in quality of life.  PHQ-9: Review Flowsheet  10/12/2023 03/20/2023 06/16/2022 04/08/2022  Depression screen PHQ 2/9  Decreased Interest 0 0 0 0  Down, Depressed, Hopeless 0 0 0 0  PHQ - 2 Score 0 0 0 0  Altered sleeping 0 - 0 -  Tired, decreased energy 0 - 0 -  Change in appetite 0 - 0 -  Feeling bad or failure about yourself  0 - 0 -  Trouble concentrating 0 - 0 -  Moving slowly or fidgety/restless 0 - 0 -  Suicidal thoughts 0 - 0 -  PHQ-9 Score 0 - 0 -   Interpretation of Total Score  Total Score Depression Severity:  1-4 = Minimal depression, 5-9 = Mild depression, 10-14 = Moderate  depression, 15-19 = Moderately severe depression, 20-27 = Severe depression   Psychosocial Evaluation and Intervention:   Psychosocial Re-Evaluation:  Psychosocial Re-Evaluation     Row Name 11/06/23 1335 11/26/23 1059 12/25/23 1440         Psychosocial Re-Evaluation   Current issues with None Identified None Identified None Identified     Interventions Encouraged to attend Cardiac Rehabilitation for the exercise Encouraged to attend Cardiac Rehabilitation for the exercise Encouraged to attend Cardiac Rehabilitation for the exercise     Continue Psychosocial Services  No Follow up required No Follow up required No Follow up required              Psychosocial Discharge (Final Psychosocial Re-Evaluation):  Psychosocial Re-Evaluation - 12/25/23 1440       Psychosocial Re-Evaluation   Current issues with None Identified    Interventions Encouraged to attend Cardiac Rehabilitation for the exercise    Continue Psychosocial Services  No Follow up required             Vocational Rehabilitation: Provide vocational rehab assistance to qualifying candidates.   Vocational Rehab Evaluation & Intervention:  Vocational Rehab - 10/12/23 1404       Initial Vocational Rehab Evaluation & Intervention   Assessment shows need for Vocational Rehabilitation No   [place] is working            Education: Education Goals: Education classes will be provided on a weekly basis, covering required topics. Participant will state understanding/return demonstration of topics presented.    Education     Row Name 11/18/23 0800     Education   Cardiac Education Topics Pritikin   Customer service manager   Weekly Topic [employer]- Spotlight on the [employer]   Instruction Review Code 1- Verbalizes Understanding   Class Start Time 0815   Class Stop Time 0850   Class Time Calculation (min) 35 min            Core Videos: Exercise     Move It!  Clinical staff conducted group or individual video education with verbal and written material and guidebook.  Patient learns the recommended Pritikin exercise program. Exercise with the goal of living a long, healthy life. Some of the health benefits of exercise include controlled diabetes, healthier blood pressure levels, improved cholesterol levels, improved heart and lung capacity, improved sleep, and better body composition. Everyone should speak with their doctor before starting or changing an exercise routine.  Biomechanical Limitations Clinical staff conducted group or individual video education with verbal and written material and guidebook.  Patient learns how biomechanical limitations can impact exercise and how we can mitigate and possibly overcome limitations to have an impactful and balanced exercise routine.  Body Composition Clinical staff conducted group or individual video education with verbal and written material and guidebook.  Patient learns that body composition (ratio of muscle mass to fat mass) is a key component to assessing overall fitness, rather than body weight alone. Increased fat mass, especially visceral belly fat, can put us  at increased risk for metabolic syndrome, type 2 diabetes, heart disease, and even death. It is recommended to combine diet and exercise (cardiovascular and resistance training) to improve your body composition. Seek guidance from your physician and exercise physiologist before implementing an exercise routine.  Exercise Action Plan Clinical staff conducted group or individual video education with verbal and written material and guidebook.  Patient learns the recommended strategies to achieve and enjoy long-term exercise adherence, including variety, self-motivation, self-efficacy, and positive decision making. Benefits of exercise include fitness, good health, weight management, more energy, better sleep, less stress, and overall  well-being.  Medical   Heart Disease Risk Reduction Clinical staff conducted group or individual video education with verbal and written material and guidebook.  Patient learns our heart is our most vital organ as it circulates oxygen, nutrients, white blood cells, and hormones throughout the entire body, and carries waste away. Data supports a plant-based eating plan like the Pritikin Program for its effectiveness in slowing progression of and reversing heart disease. The video provides a number of recommendations to address heart disease.   Metabolic Syndrome and Belly Fat  Clinical staff conducted group or individual video education with verbal and written material and guidebook.  Patient learns what metabolic syndrome is, how it leads to heart disease, and how one can reverse it and keep it from coming back. You have metabolic syndrome if you have 3 of the following 5 criteria: abdominal obesity, high blood pressure, high triglycerides, low HDL cholesterol, and high blood sugar.  Hypertension and Heart Disease Clinical staff conducted group or individual video education with verbal and written material and guidebook.  Patient learns that high blood pressure, or hypertension, is very common in the [place] . Hypertension is largely due to excessive salt intake, but other important risk factors include being overweight, physical inactivity, drinking too much alcohol, smoking, and not eating enough potassium from fruits and vegetables. High blood pressure is a leading risk factor for heart attack, stroke, congestive heart failure, dementia, kidney failure, and premature death. Long-term effects of excessive salt intake include stiffening of the arteries and thickening of heart muscle and organ damage. Recommendations include ways to reduce hypertension and the risk of heart disease.  Diseases of Our Time - Focusing on Diabetes Clinical staff conducted group or individual video education with  verbal and written material and guidebook.  Patient learns why the best way to stop diseases of our time is prevention, through food and other lifestyle changes. Medicine (such as prescription pills and surgeries) is often only a Band-Aid on the problem, not a long-term solution. Most common diseases of our time include obesity, type 2 diabetes, hypertension, heart disease, and cancer. The Pritikin Program is recommended and has been proven to help reduce, reverse, and/or prevent the damaging effects of metabolic syndrome.  Nutrition   Overview of the Pritikin Eating Plan  Clinical staff conducted group or individual video education with verbal and written material and guidebook.  Patient learns about the Pritikin Eating Plan for disease risk reduction. The Pritikin Eating Plan emphasizes a wide variety of unrefined, minimally-processed carbohydrates, like fruits, vegetables, whole grains, and legumes. Go, Caution, and Stop food choices are  explained. Plant-based and lean animal proteins are emphasized. Rationale provided for low sodium intake for blood pressure control, low added sugars for blood sugar stabilization, and low added fats and oils for coronary artery disease risk reduction and weight management.  Calorie Density  Clinical staff conducted group or individual video education with verbal and written material and guidebook.  Patient learns about calorie density and how it impacts the Pritikin Eating Plan. Knowing the characteristics of the food you choose will help you decide whether those foods will lead to weight gain or weight loss, and whether you want to consume more or less of them. Weight loss is usually a side effect of the Pritikin Eating Plan because of its focus on low calorie-dense foods.  Label Reading  Clinical staff conducted group or individual video education with verbal and written material and guidebook.  Patient learns about the Pritikin recommended label reading  guidelines and corresponding recommendations regarding calorie density, added sugars, sodium content, and whole grains.  Dining Out - Part 1  Clinical staff conducted group or individual video education with verbal and written material and guidebook.  Patient learns that restaurant meals can be sabotaging because they can be so high in calories, fat, sodium, and/or sugar. Patient learns recommended strategies on how to positively address this and avoid unhealthy pitfalls.  Facts on Fats  Clinical staff conducted group or individual video education with verbal and written material and guidebook.  Patient learns that lifestyle modifications can be just as effective, if not more so, as many medications for lowering your risk of heart disease. A Pritikin lifestyle can help to reduce your risk of inflammation and atherosclerosis (cholesterol build-up, or plaque, in the artery walls). Lifestyle interventions such as dietary choices and physical activity address the cause of atherosclerosis. A review of the types of fats and their impact on blood cholesterol levels, along with dietary recommendations to reduce fat intake is also included.  Nutrition Action Plan  Clinical staff conducted group or individual video education with verbal and written material and guidebook.  Patient learns how to incorporate Pritikin recommendations into their lifestyle. Recommendations include planning and keeping personal health goals in mind as an important part of their success.  Healthy Mind-Set    Healthy Minds, Bodies, Hearts  Clinical staff conducted group or individual video education with verbal and written material and guidebook.  Patient learns how to identify when they are stressed. Video will discuss the impact of that stress, as well as the many benefits of stress management. Patient will also be introduced to stress management techniques. The way we think, act, and feel has an impact on our hearts.  How Our  Thoughts Can Heal Our Hearts  Clinical staff conducted group or individual video education with verbal and written material and guidebook.  Patient learns that negative thoughts can cause depression and anxiety. This can result in negative lifestyle behavior and serious health problems. Cognitive behavioral therapy is an effective method to help control our thoughts in order to change and improve our emotional outlook.  Additional Videos:  Exercise    Improving Performance  Clinical staff conducted group or individual video education with verbal and written material and guidebook.  Patient learns to use a non-linear approach by alternating intensity levels and lengths of time spent exercising to help burn more calories and lose more body fat. Cardiovascular exercise helps improve heart health, metabolism, hormonal balance, blood sugar control, and recovery from fatigue. Resistance training improves strength, endurance, balance, coordination,  reaction time, metabolism, and muscle mass. Flexibility exercise improves circulation, posture, and balance. Seek guidance from your physician and exercise physiologist before implementing an exercise routine and learn your capabilities and proper form for all exercise.  Introduction to Yoga  Clinical staff conducted group or individual video education with verbal and written material and guidebook.  Patient learns about yoga, a discipline of the coming together of mind, breath, and body. The benefits of yoga include improved flexibility, improved range of motion, better posture and core strength, increased lung function, weight loss, and positive self-image. Yoga's heart health benefits include lowered blood pressure, healthier heart rate, decreased cholesterol and triglyceride levels, improved immune function, and reduced stress. Seek guidance from your physician and exercise physiologist before implementing an exercise routine and learn your capabilities and  proper form for all exercise.  Medical   Aging: Enhancing Your Quality of Life  Clinical staff conducted group or individual video education with verbal and written material and guidebook.  Patient learns key strategies and recommendations to stay in good physical health and enhance quality of life, such as prevention strategies, having an advocate, securing a Health Care Proxy and Power of Attorney, and keeping a list of medications and system for tracking them. It also discusses how to avoid risk for bone loss.  Biology of Weight Control  Clinical staff conducted group or individual video education with verbal and written material and guidebook.  Patient learns that weight gain occurs because we consume more calories than we burn (eating more, moving less). Even if your body weight is normal, you may have higher ratios of fat compared to muscle mass. Too much body fat puts you at increased risk for cardiovascular disease, heart attack, stroke, type 2 diabetes, and obesity-related cancers. In addition to exercise, following the Pritikin Eating Plan can help reduce your risk.  Decoding Lab Results  Clinical staff conducted group or individual video education with verbal and written material and guidebook.  Patient learns that lab test reflects one measurement whose values change over time and are influenced by many factors, including medication, stress, sleep, exercise, food, hydration, pre-existing medical conditions, and more. It is recommended to use the knowledge from this video to become more involved with your lab results and evaluate your numbers to speak with your doctor.   Diseases of Our Time - Overview  Clinical staff conducted group or individual video education with verbal and written material and guidebook.  Patient learns that according to the CDC, 50% to 70% of chronic diseases (such as obesity, type 2 diabetes, elevated lipids, hypertension, and heart disease) are avoidable through  lifestyle improvements including healthier food choices, listening to satiety cues, and increased physical activity.  Sleep Disorders Clinical staff conducted group or individual video education with verbal and written material and guidebook.  Patient learns how good quality and duration of sleep are important to overall health and well-being. Patient also learns about sleep disorders and how they impact health along with recommendations to address them, including discussing with a physician.  Nutrition  Dining Out - Part 2 Clinical staff conducted group or individual video education with verbal and written material and guidebook.  Patient learns how to plan ahead and communicate in order to maximize their dining experience in a healthy and nutritious manner. Included are recommended food choices based on the type of restaurant the patient is visiting.   Fueling a Banker conducted group or individual video education with verbal and written material  and guidebook.  There is a strong connection between our food choices and our health. Diseases like obesity and type 2 diabetes are very prevalent and are in large-part due to lifestyle choices. The Pritikin Eating Plan provides plenty of food and hunger-curbing satisfaction. It is easy to follow, affordable, and helps reduce health risks.  Menu Workshop  Clinical staff conducted group or individual video education with verbal and written material and guidebook.  Patient learns that restaurant meals can sabotage health goals because they are often packed with calories, fat, sodium, and sugar. Recommendations include strategies to plan ahead and to communicate with the manager, chef, or server to help order a healthier meal.  Planning Your Eating Strategy  Clinical staff conducted group or individual video education with verbal and written material and guidebook.  Patient learns about the Pritikin Eating Plan and its benefit of  reducing the risk of disease. The Pritikin Eating Plan does not focus on calories. Instead, it emphasizes high-quality, nutrient-rich foods. By knowing the characteristics of the foods, we choose, we can determine their calorie density and make informed decisions.  Targeting Your Nutrition Priorities  Clinical staff conducted group or individual video education with verbal and written material and guidebook.  Patient learns that lifestyle habits have a tremendous impact on disease risk and progression. This video provides eating and physical activity recommendations based on your personal health goals, such as reducing LDL cholesterol, losing weight, preventing or controlling type 2 diabetes, and reducing high blood pressure.  Vitamins and Minerals  Clinical staff conducted group or individual video education with verbal and written material and guidebook.  Patient learns different ways to obtain key vitamins and minerals, including through a recommended healthy diet. It is important to discuss all supplements you take with your doctor.   Healthy Mind-Set    Smoking Cessation  Clinical staff conducted group or individual video education with verbal and written material and guidebook.  Patient learns that cigarette smoking and tobacco addiction pose a serious health risk which affects millions of people. Stopping smoking will significantly reduce the risk of heart disease, lung disease, and many forms of cancer. Recommended strategies for quitting are covered, including working with your doctor to develop a successful plan.  Culinary   Becoming a Set designer conducted group or individual video education with verbal and written material and guidebook.  Patient learns that cooking at home can be healthy, cost-effective, quick, and puts them in control. Keys to cooking healthy recipes will include looking at your recipe, assessing your equipment needs, planning ahead, making it  simple, choosing cost-effective seasonal ingredients, and limiting the use of added fats, salts, and sugars.  Cooking - Breakfast and Snacks  Clinical staff conducted group or individual video education with verbal and written material and guidebook.  Patient learns how important breakfast is to satiety and nutrition through the entire day. Recommendations include key foods to eat during breakfast to help stabilize blood sugar levels and to prevent overeating at meals later in the day. Planning ahead is also a key component.  Cooking - Educational psychologist conducted group or individual video education with verbal and written material and guidebook.  Patient learns eating strategies to improve overall health, including an approach to cook more at home. Recommendations include thinking of animal protein as a side on your plate rather than center stage and focusing instead on lower calorie dense options like vegetables, fruits, whole grains, and plant-based proteins, such as  beans. Making sauces in large quantities to freeze for later and leaving the skin on your vegetables are also recommended to maximize your experience.  Cooking - Healthy Salads and Dressing Clinical staff conducted group or individual video education with verbal and written material and guidebook.  Patient learns that vegetables, fruits, whole grains, and legumes are the foundations of the Pritikin Eating Plan. Recommendations include how to incorporate each of these in flavorful and healthy salads, and how to create homemade salad dressings. Proper handling of ingredients is also covered. Cooking - Soups and [employer] - Soups and Desserts Clinical staff conducted group or individual video education with verbal and written material and guidebook.  Patient learns that Pritikin soups and desserts make for easy, nutritious, and delicious snacks and meal components that are low in sodium, fat, sugar, and calorie  density, while high in vitamins, minerals, and filling fiber. Recommendations include simple and healthy ideas for soups and desserts.   Overview     The Pritikin Solution Program Overview Clinical staff conducted group or individual video education with verbal and written material and guidebook.  Patient learns that the results of the Pritikin Program have been documented in more than 100 articles published in peer-reviewed journals, and the benefits include reducing risk factors for (and, in some cases, even reversing) high cholesterol, high blood pressure, type 2 diabetes, obesity, and more! An overview of the three key pillars of the Pritikin Program will be covered: eating well, doing regular exercise, and having a healthy mind-set.  WORKSHOPS  Exercise: Exercise Basics: Building Your Action Plan Clinical staff led group instruction and group discussion with PowerPoint presentation and patient guidebook. To enhance the learning environment the use of posters, models and videos may be added. At the conclusion of this workshop, patients will comprehend the difference between physical activity and exercise, as well as the benefits of incorporating both, into their routine. Patients will understand the FITT (Frequency, Intensity, Time, and Type) principle and how to use it to build an exercise action plan. In addition, safety concerns and other considerations for exercise and cardiac rehab will be addressed by the presenter. The purpose of this lesson is to promote a comprehensive and effective weekly exercise routine in order to improve patients' overall level of fitness.   Managing Heart Disease: Your Path to a Healthier Heart Clinical staff led group instruction and group discussion with PowerPoint presentation and patient guidebook. To enhance the learning environment the use of posters, models and videos may be added.At the conclusion of this workshop, patients will understand the  anatomy and physiology of the heart. Additionally, they will understand how Pritikin's three pillars impact the risk factors, the progression, and the management of heart disease.  The purpose of this lesson is to provide a high-level overview of the heart, heart disease, and how the Pritikin lifestyle positively impacts risk factors.  Exercise Biomechanics Clinical staff led group instruction and group discussion with PowerPoint presentation and patient guidebook. To enhance the learning environment the use of posters, models and videos may be added. Patients will learn how the structural parts of their bodies function and how these functions impact their daily activities, movement, and exercise. Patients will learn how to promote a neutral spine, learn how to manage pain, and identify ways to improve their physical movement in order to promote healthy living. The purpose of this lesson is to expose patients to common physical limitations that impact physical activity. Participants will learn practical ways to adapt  and manage aches and pains, and to minimize their effect on regular exercise. Patients will learn how to maintain good posture while sitting, walking, and lifting.  Balance Training and Fall Prevention  Clinical staff led group instruction and group discussion with PowerPoint presentation and patient guidebook. To enhance the learning environment the use of posters, models and videos may be added. At the conclusion of this workshop, patients will understand the importance of their sensorimotor skills (vision, proprioception, and the vestibular system) in maintaining their ability to balance as they age. Patients will apply a variety of balancing exercises that are appropriate for their current level of function. Patients will understand the common causes for poor balance, possible solutions to these problems, and ways to modify their physical environment in order to minimize their  fall risk. The purpose of this lesson is to teach patients about the importance of maintaining balance as they age and ways to minimize their risk of falling.  WORKSHOPS   Nutrition:  Fueling a Ship broker led group instruction and group discussion with PowerPoint presentation and patient guidebook. To enhance the learning environment the use of posters, models and videos may be added. Patients will review the foundational principles of the Pritikin Eating Plan and understand what constitutes a serving size in each of the food groups. Patients will also learn Pritikin-friendly foods that are better choices when away from home and review make-ahead meal and snack options. Calorie density will be reviewed and applied to three nutrition priorities: weight maintenance, weight loss, and weight gain. The purpose of this lesson is to reinforce (in a group setting) the key concepts around what patients are recommended to eat and how to apply these guidelines when away from home by planning and selecting Pritikin-friendly options. Patients will understand how calorie density may be adjusted for different weight management goals.  Mindful Eating  Clinical staff led group instruction and group discussion with PowerPoint presentation and patient guidebook. To enhance the learning environment the use of posters, models and videos may be added. Patients will briefly review the concepts of the Pritikin Eating Plan and the importance of low-calorie dense foods. The concept of mindful eating will be introduced as well as the importance of paying attention to internal hunger signals. Triggers for non-hunger eating and techniques for dealing with triggers will be explored. The purpose of this lesson is to provide patients with the opportunity to review the basic principles of the Pritikin Eating Plan, discuss the value of eating mindfully and how to measure internal cues of hunger and fullness using the  Hunger Scale. Patients will also discuss reasons for non-hunger eating and learn strategies to use for controlling emotional eating.  Targeting Your Nutrition Priorities Clinical staff led group instruction and group discussion with PowerPoint presentation and patient guidebook. To enhance the learning environment the use of posters, models and videos may be added. Patients will learn how to determine their genetic susceptibility to disease by reviewing their family history. Patients will gain insight into the importance of diet as part of an overall healthy lifestyle in mitigating the impact of genetics and other environmental insults. The purpose of this lesson is to provide patients with the opportunity to assess their personal nutrition priorities by looking at their family history, their own health history and current risk factors. Patients will also be able to discuss ways of prioritizing and modifying the Pritikin Eating Plan for their highest risk areas  Menu  Clinical staff led group instruction and  group discussion with PowerPoint presentation and patient guidebook. To enhance the learning environment the use of posters, models and videos may be added. Using menus brought in from [employer], or printed from [employer], patients will apply the Pritikin dining out guidelines that were presented in the [employer] video. Patients will also be able to practice these guidelines in a variety of provided scenarios. The purpose of this lesson is to provide patients with the opportunity to practice hands-on learning of the Pritikin Dining Out guidelines with actual menus and practice scenarios.  Label Reading Clinical staff led group instruction and group discussion with PowerPoint presentation and patient guidebook. To enhance the learning environment the use of posters, models and videos may be added. Patients will review and discuss the Pritikin label reading guidelines  presented in [employer]ritikin's Label Reading Educational series video. Using fool labels brought in from local grocery stores and markets, patients will apply the label reading guidelines and determine if the packaged food meet the Pritikin guidelines. The purpose of this lesson is to provide patients with the opportunity to review, discuss, and practice hands-on learning of the Pritikin Label Reading guidelines with actual packaged food labels. Cooking School  Pritikin's [employer] are designed to teach patients ways to prepare quick, simple, and affordable recipes at home. The importance of nutrition's role in chronic disease risk reduction is reflected in its emphasis in the overall Pritikin program. By learning how to prepare essential core Pritikin Eating Plan recipes, patients will increase control over what they eat; be able to customize the flavor of foods without the use of added salt, sugar, or fat; and improve the quality of the food they consume. By learning a set of core recipes which are easily assembled, quickly prepared, and affordable, patients are more likely to prepare more healthy foods at home. These workshops focus on convenient breakfasts, simple entres, side dishes, and desserts which can be prepared with minimal effort and are consistent with nutrition recommendations for cardiovascular risk reduction. Cooking [employer] are taught by a Armed forces logistics/support/administrative officer (RD) who has been trained by the [employer]. The chef or RD has a clear understanding of the importance of minimizing - if not completely eliminating - added fat, sugar, and sodium in recipes. Throughout the series of Cooking School Workshop sessions, patients will learn about healthy ingredients and efficient methods of cooking to build confidence in their capability to prepare    Cooking School weekly topics:  Adding Flavor- Sodium-Free  Fast and Healthy Breakfasts  Powerhouse Plant-Based  Proteins  Satisfying Salads and Dressings  Simple Sides and Sauces  International Cuisine-Spotlight on the [employer] Zones  Delicious Desserts  Savory Soups  [employer] - Meals in a Astronomer Appetizers and Snacks  Comforting Weekend Breakfasts  One-Pot Wonders   Fast Evening Meals  Landscape architect Your Pritikin Plate  WORKSHOPS   Healthy Mindset (Psychosocial):  Focused Goals, Sustainable Changes Clinical staff led group instruction and group discussion with PowerPoint presentation and patient guidebook. To enhance the learning environment the use of posters, models and videos may be added. Patients will be able to apply effective goal setting strategies to establish at least one personal goal, and then take consistent, meaningful action toward that goal. They will learn to identify common barriers to achieving personal goals and develop strategies to overcome them. Patients will also gain an understanding of how our mind-set can impact our ability to achieve goals  and the importance of cultivating a positive and growth-oriented mind-set. The purpose of this lesson is to provide patients with a deeper understanding of how to set and achieve personal goals, as well as the tools and strategies needed to overcome common obstacles which may arise along the way.  From Head to Heart: The Power of a Healthy Outlook  Clinical staff led group instruction and group discussion with PowerPoint presentation and patient guidebook. To enhance the learning environment the use of posters, models and videos may be added. Patients will be able to recognize and describe the impact of emotions and mood on physical health. They will discover the importance of self-care and explore self-care practices which may work for them. Patients will also learn how to utilize the 4 C's to cultivate a healthier outlook and better manage stress and challenges. The purpose of this lesson is to demonstrate  to patients how a healthy outlook is an essential part of maintaining good health, especially as they continue their cardiac rehab journey.  Healthy Sleep for a Healthy Heart Clinical staff led group instruction and group discussion with PowerPoint presentation and patient guidebook. To enhance the learning environment the use of posters, models and videos may be added. At the conclusion of this workshop, patients will be able to demonstrate knowledge of the importance of sleep to overall health, well-being, and quality of life. They will understand the symptoms of, and treatments for, common sleep disorders. Patients will also be able to identify daytime and nighttime behaviors which impact sleep, and they will be able to apply these tools to help manage sleep-related challenges. The purpose of this lesson is to provide patients with a general overview of sleep and outline the importance of quality sleep. Patients will learn about a few of the most common sleep disorders. Patients will also be introduced to the concept of "sleep hygiene," and discover ways to self-manage certain sleeping problems through simple daily behavior changes. Finally, the workshop will motivate patients by clarifying the links between quality sleep and their goals of heart-healthy living.   Recognizing and Reducing Stress Clinical staff led group instruction and group discussion with PowerPoint presentation and patient guidebook. To enhance the learning environment the use of posters, models and videos may be added. At the conclusion of this workshop, patients will be able to understand the types of stress reactions, differentiate between acute and chronic stress, and recognize the impact that chronic stress has on their health. They will also be able to apply different coping mechanisms, such as reframing negative self-talk. Patients will have the opportunity to practice a variety of stress management techniques, such as deep  abdominal breathing, progressive muscle relaxation, and/or guided imagery.  The purpose of this lesson is to educate patients on the role of stress in their lives and to provide healthy techniques for coping with it.  Learning Barriers/Preferences:  Learning Barriers/Preferences - 10/12/23 1404       Learning Barriers/Preferences   Learning Barriers Sight   reading glasses   Learning Preferences Audio;Computer/Internet;Group Instruction;Individual Instruction;Skilled Demonstration;Verbal Instruction;Video;Written Material;Pictoral             Education Topics:  Knowledge Questionnaire Score:  Knowledge Questionnaire Score - 10/12/23 1404       Knowledge Questionnaire Score   Pre Score 21/24             Core Components/Risk Factors/Patient Goals at Admission:  Personal Goals and Risk Factors at Admission - 10/12/23 1404  Core Components/Risk Factors/Patient Goals on Admission    Weight Management Yes;Weight Loss    Intervention Weight Management: Provide education and appropriate resources to help participant work on and attain dietary goals.;Weight Management: Develop a combined nutrition and exercise program designed to reach desired caloric intake, while maintaining appropriate intake of nutrient and fiber, sodium and fats, and appropriate energy expenditure required for the weight goal.    Admit Weight 205 lb 0.4 oz (93 kg)    Goal Weight: Long Term 187 lb (84.8 kg)   pt goal   Expected Outcomes Short Term: Continue to assess and modify interventions until short term weight is achieved;Long Term: Adherence to nutrition and physical activity/exercise program aimed toward attainment of established weight goal;Weight Loss: Understanding of general recommendations for a balanced deficit meal plan, which promotes 1-2 lb weight loss per week and includes a negative energy balance of [id number] kcal/d;Understanding recommendations for meals to include 15-35% energy as protein,  25-35% energy from fat, 35-60% energy from carbohydrates, less than 200mg  of dietary cholesterol, 20-35 gm of total fiber daily;Understanding of distribution of calorie intake throughout the day with the consumption of 4-5 meals/snacks    Hypertension Yes    Intervention Provide education on lifestyle modifcations including regular physical activity/exercise, weight management, moderate sodium restriction and increased consumption of fresh fruit, vegetables, and low fat dairy, alcohol moderation, and smoking cessation.;Monitor prescription use compliance.    Expected Outcomes Short Term: Continued assessment and intervention until BP is < 140/36mm HG in hypertensive participants. < 130/60mm HG in hypertensive participants with diabetes, heart failure or chronic kidney disease.;Long Term: Maintenance of blood pressure at goal levels.    Lipids Yes    Intervention Provide education and support for participant on nutrition & aerobic/resistive exercise along with prescribed medications to achieve LDL 70mg , HDL >40mg .    Expected Outcomes Short Term: Participant states understanding of desired cholesterol values and is compliant with medications prescribed. Participant is following exercise prescription and nutrition guidelines.;Long Term: Cholesterol controlled with medications as prescribed, with individualized exercise RX and with personalized nutrition plan. Value goals: LDL < 70mg , HDL > 40 mg.             Core Components/Risk Factors/Patient Goals Review:   Goals and Risk Factor Review     Row Name 11/06/23 1335 11/26/23 1100 12/25/23 1440         Core Components/Risk Factors/Patient Goals Review   Personal Goals Review Weight Management/Obesity;Hypertension;Lipids Weight Management/Obesity;Hypertension;Lipids Weight Management/Obesity;Hypertension;Lipids     Review Kayleb started cardiac rehab on 11/02/23. Myrick is off to a good start to exercise. Vital signs have been stable. Braxon is  doing well well with exercise. Vital signs have been stable.Griselda has lost 1.9 kg since starting cardiac rehab. intermittent PVC's noted. Asymptomatic. Will continue to monitor. Colbey continues to do well with exercise. Vital signs remain stable.     Expected Outcomes Clemente has lost 2.4 kg since starting cardiac rehab. Zechariah will continue to participate in cardiac rehab for exercise nutrtion and lifestyle modifications Paz will continue to participate in cardiac rehab for exercise nutrtion and lifestyle modifications              Core Components/Risk Factors/Patient Goals at Discharge (Final Review):   Goals and Risk Factor Review - 12/25/23 1440       Core Components/Risk Factors/Patient Goals Review   Personal Goals Review Weight Management/Obesity;Hypertension;Lipids    Review Jia continues to do well with exercise. Vital signs remain stable.  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 Introduction to pritikin education/intensive cardiac rehab. Initial orientation packet reviewed with patient. 30 Day ITP Review. Jaxsun started cardiac rehab on 11/02/23 30 Day ITP Review. Ugo has good attendance and participation with exercise at cardiac rehab 30 Day ITP Review. Drystan continues to have  good attendance and participation with exercise at cardiac rehab             Comments: See ITP comment

## 2023-12-30 ENCOUNTER — Ambulatory Visit (HOSPITAL_COMMUNITY): Payer: Commercial Managed Care - PPO

## 2023-12-30 ENCOUNTER — Encounter (HOSPITAL_COMMUNITY)
Admission: RE | Admit: 2023-12-30 | Discharge: 2023-12-30 | Disposition: A | Discharge status: Home / Self Care | Source: Ambulatory Visit | Attending: Cardiovascular Disease | Admitting: Cardiovascular Disease

## 2023-12-30 VITALS — Ht 70.0 in | Wt 206.1 lb

## 2023-12-30 DIAGNOSIS — Z951 Presence of aortocoronary bypass graft: Secondary | ICD-10-CM

## 2024-01-01 ENCOUNTER — Ambulatory Visit (HOSPITAL_COMMUNITY): Payer: Commercial Managed Care - PPO

## 2024-01-01 ENCOUNTER — Encounter (HOSPITAL_COMMUNITY)
Admission: RE | Admit: 2024-01-01 | Discharge: 2024-01-01 | Disposition: A | Discharge status: Home / Self Care | Source: Ambulatory Visit | Attending: Cardiovascular Disease | Admitting: Cardiovascular Disease

## 2024-01-01 DIAGNOSIS — Z951 Presence of aortocoronary bypass graft: Secondary | ICD-10-CM | POA: Diagnosis not present

## 2024-01-06 ENCOUNTER — Encounter (HOSPITAL_COMMUNITY)
Admission: RE | Admit: 2024-01-06 | Discharge: 2024-01-06 | Disposition: A | Discharge status: Home / Self Care | Source: Ambulatory Visit | Attending: Cardiovascular Disease

## 2024-01-06 ENCOUNTER — Ambulatory Visit (HOSPITAL_COMMUNITY): Payer: Commercial Managed Care - PPO

## 2024-01-06 DIAGNOSIS — Z951 Presence of aortocoronary bypass graft: Secondary | ICD-10-CM | POA: Diagnosis not present

## 2024-01-06 NOTE — Progress Notes (Signed)
 Discharge Progress Report  Patient Details  Name: Clinton [name] MRN: [id number] Date of Birth: 06-Dec-1955 Referring Provider:   Flowsheet Row INTENSIVE CARDIAC REHAB ORIENT from 10/12/2023 in [facility]  Referring Provider Maeola Schmidt, MD        Number of Visits: 24  Reason for Discharge:  Patient reached a stable level of exercise. Patient independent in their exercise. Patient has met program and personal goals.  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  ADL UCSD:   Initial Exercise Prescription:  Initial Exercise Prescription - 10/12/23 1500       Date of Initial Exercise RX and Referring Provider   Date 10/12/23    Referring Provider Maeola Schmidt, MD    Expected Discharge Date 01/06/24      NuStep   Level 2    SPM 70    Minutes 15    METs 2.5      Recumbant Elliptical   Level 1    RPM 60    Watts 80    Minutes 15    METs 2.5      Prescription Details   Frequency (times per week) 3    Duration Progress to 30 minutes of continuous aerobic without signs/symptoms of physical distress      Intensity   THRR 40-80% of Max Heartrate 61-122    Ratings of Perceived Exertion 11-13    Perceived Dyspnea 0-4      Progression   Progression Continue progressive overload as per policy without signs/symptoms or physical distress.      Resistance Training   Training Prescription Yes    Weight 3    Reps 10-15             Discharge Exercise Prescription (Final Exercise Prescription Changes):  Exercise Prescription Changes - 01/06/24 0827       Response to Exercise   Blood Pressure (Admit) 120/64    Blood Pressure (Exercise) --   Over 10 sessions no ExBP needed   Blood Pressure (Exit) 118/60    Heart Rate (Admit) 75 bpm    Heart Rate (Exercise) 88 bpm     Heart Rate (Exit) 67 bpm    Rating of Perceived Exertion (Exercise) 10    Perceived Dyspnea (Exercise) 0    Symptoms none    Comments Pt graduated teh [employer] program    Duration Continue with 30 min of aerobic exercise without signs/symptoms of physical distress.    Intensity THRR unchanged      Progression   Progression Continue to progress workloads to maintain intensity without signs/symptoms of physical distress.    Average METs 2.1      Resistance Training   Training Prescription No    Weight 4    Reps 10-15    Time 10 Minutes      Interval Training   Interval Training No      NuStep   Level 3    SPM 90    Minutes 15    METs 2.1      Arm Ergometer   Level 3.6    Minutes 15    METs --   Did not report            Functional Capacity:  6 Minute Walk  Row Name 10/12/23 1519 12/30/23 0819       6 Minute Walk   Phase Initial Discharge    Distance 1270 feet 1380 feet    Distance % Change -- 8.66 %    Distance Feet Change -- 110 ft    Walk Time 6 minutes 6 minutes    # of Rest Breaks 0 0    MPH 2.41 2.61    METS 3.23 3.12    RPE 12 13    Perceived Dyspnea  0 0    VO2 Peak 11.3 10.92    Symptoms Yes (comment) Yes (comment)    Comments no s/sx or pain. back tightness Chronic L knee pain 3/10    Resting HR 73 bpm 70 bpm    Resting BP 118/66 120/58    Resting Oxygen Saturation  97 % --    Exercise Oxygen Saturation  during 6 min walk 97 % --    Max Ex. HR 115 bpm 100 bpm    Max Ex. BP 154/70 138/64    2 Minute Post BP 126/64 120/62             Psychological, QOL, Others - Outcomes: PHQ 2/9:    01/06/2024    3:59 PM 10/12/2023    3:19 PM 03/20/2023    3:01 PM 06/16/2022    3:19 PM 04/08/2022    3:30 PM  Depression screen PHQ 2/9  Decreased Interest 0 0 0 0 0  Down, Depressed, Hopeless 0 0 0 0 0  PHQ - 2 Score 0 0 0 0 0  Altered sleeping 0 0  0   Tired, decreased energy 0 0  0   Change in appetite 0 0  0   Feeling bad or failure  about yourself  0 0  0   Trouble concentrating 0 0  0   Moving slowly or fidgety/restless 0 0  0   Suicidal thoughts 0 0  0   PHQ-9 Score 0 0  0     Quality of Life:  Quality of Life - 01/06/24 0839       Quality of Life Scores   Health/Function Post 21.82 %    Socioeconomic Post 27.36 %    Psych/Spiritual Post 22.36 %    Family Post 22.8 %    GLOBAL Post 23.26 %             Personal Goals: Goals established at orientation with interventions provided to work toward goal.  Personal Goals and Risk Factors at Admission - 10/12/23 1404       Core Components/Risk Factors/Patient Goals on Admission    Weight Management Yes;Weight Loss    Intervention Weight Management: Provide education and appropriate resources to help participant work on and attain dietary goals.;Weight Management: Develop a combined nutrition and exercise program designed to reach desired caloric intake, while maintaining appropriate intake of nutrient and fiber, sodium and fats, and appropriate energy expenditure required for the weight goal.    Admit Weight 205 lb 0.4 oz (93 kg)    Goal Weight: Long Term 187 lb (84.8 kg)   pt goal   Expected Outcomes Short Term: Continue to assess and modify interventions until short term weight is achieved;Long Term: Adherence to nutrition and physical activity/exercise program aimed toward attainment of established weight goal;Weight Loss: Understanding of general recommendations for a balanced deficit meal plan, which promotes 1-2 lb weight loss per week and includes a negative energy balance of [phone] kcal/d;Understanding recommendations for  meals to include 15-35% energy as protein, 25-35% energy from fat, 35-60% energy from carbohydrates, less than 200mg  of dietary cholesterol, 20-35 gm of total fiber daily;Understanding of distribution of calorie intake throughout the day with the consumption of 4-5 meals/snacks    Hypertension Yes    Intervention Provide education on  lifestyle modifcations including regular physical activity/exercise, weight management, moderate sodium restriction and increased consumption of fresh fruit, vegetables, and low fat dairy, alcohol moderation, and smoking cessation.;Monitor prescription use compliance.    Expected Outcomes Short Term: Continued assessment and intervention until BP is < 140/52mm HG in hypertensive participants. < 130/38mm HG in hypertensive participants with diabetes, heart failure or chronic kidney disease.;Long Term: Maintenance of blood pressure at goal levels.    Lipids Yes    Intervention Provide education and support for participant on nutrition & aerobic/resistive exercise along with prescribed medications to achieve LDL 70mg , HDL >40mg .    Expected Outcomes Short Term: Participant states understanding of desired cholesterol values and is compliant with medications prescribed. Participant is following exercise prescription and nutrition guidelines.;Long Term: Cholesterol controlled with medications as prescribed, with individualized exercise RX and with personalized nutrition plan. Value goals: LDL < 70mg , HDL > 40 mg.              Personal Goals Discharge:  Goals and Risk Factor Review     Row Name 11/06/23 1335 11/26/23 1100 12/25/23 1440 01/08/24 0920       Core Components/Risk Factors/Patient Goals Review   Personal Goals Review Weight Management/Obesity;Hypertension;Lipids Weight Management/Obesity;Hypertension;Lipids Weight Management/Obesity;Hypertension;Lipids Weight Management/Obesity;Hypertension;Lipids    Review Jahlen started cardiac rehab on 11/02/23. Ksean is off to a good start to exercise. Vital signs have been stable. Horacio is doing well well with exercise. Vital signs have been stable.Jachai has lost 1.9 kg since starting cardiac rehab. intermittent PVC's noted. Asymptomatic. Will continue to monitor. Ramses continues to do well with exercise. Vital signs remain stable. Staci  graduates today with the completion of 24 sessions in Intensive Cardiac Rehab 10/12/23 - 01/08/24. Weight remained stable at 93.4 kg.  Vital signs well within acceptable limits.  Lipids wel managed within acceptable limits - consistent adherence to high dose statin and Zetia  as well as contiued exercise and adhering to heart healthy diet. Continues to work as he enjoys what he does and mentally and physically can still perform.    Expected Outcomes Edder has lost 2.4 kg since starting cardiac rehab. Bartholomew will continue to participate in cardiac rehab for exercise nutrtion and lifestyle modifications Paulanthony will continue to participate in cardiac rehab for exercise nutrtion and lifestyle modifications Zaidan plans to set himself an "appt"at 6:45 in the morning at [employer] with the goal of every day 30-45 minutes of aerobic activity along with strength training.  I anticipate he will do well and be consistent with continuing his exercise program.  Pleasure to have him in our program.             Exercise Goals and Review:  Exercise Goals     Row Name 10/12/23 1359             Exercise Goals   Increase Physical Activity Yes       Intervention Provide advice, education, support and counseling about physical activity/exercise needs.;Develop an individualized exercise prescription for aerobic and resistive training based on initial evaluation findings, risk stratification, comorbidities and participant's personal goals.       Expected Outcomes Short Term: Attend rehab on a  regular basis to increase amount of physical activity.;Long Term: Exercising regularly at least 3-5 days a week.;Long Term: Add in home exercise to make exercise part of routine and to increase amount of physical activity.       Increase Strength and Stamina Yes       Intervention Provide advice, education, support and counseling about physical activity/exercise needs.;Develop an individualized exercise  prescription for aerobic and resistive training based on initial evaluation findings, risk stratification, comorbidities and participant's personal goals.       Expected Outcomes Short Term: Increase workloads from initial exercise prescription for resistance, speed, and METs.;Short Term: Perform resistance training exercises routinely during rehab and add in resistance training at home;Long Term: Improve cardiorespiratory fitness, muscular endurance and strength as measured by increased METs and functional capacity ( )       Able to understand and use rate of perceived exertion (RPE) scale Yes       Intervention Provide education and explanation on how to use RPE scale       Expected Outcomes Short Term: Able to use RPE daily in rehab to express subjective intensity level;Long Term:  Able to use RPE to guide intensity level when exercising independently       Knowledge and understanding of Target Heart Rate Range (THRR) Yes       Intervention Provide education and explanation of THRR including how the numbers were predicted and where they are located for reference       Expected Outcomes Short Term: Able to state/look up THRR;Long Term: Able to use THRR to govern intensity when exercising independently;Short Term: Able to use daily as guideline for intensity in rehab       Understanding of Exercise Prescription Yes       Intervention Provide education, explanation, and written materials on patient's individual exercise prescription       Expected Outcomes Short Term: Able to explain program exercise prescription;Long Term: Able to explain home exercise prescription to exercise independently                Exercise Goals Re-Evaluation:  Exercise Goals Re-Evaluation     Row Name 11/02/23 1637 11/18/23 0816 12/07/23 0834 12/28/23 0825 01/06/24 0821     Exercise Goal Re-Evaluation   Exercise Goals Review Increase Physical Activity;Understanding of Exercise Prescription;Increase Strength and  Stamina;Knowledge and understanding of Target Heart Rate Range (THRR);Able to understand and use rate of perceived exertion (RPE) scale Increase Physical Activity;Understanding of Exercise Prescription;Increase Strength and Stamina;Knowledge and understanding of Target Heart Rate Range (THRR);Able to understand and use rate of perceived exertion (RPE) scale Increase Physical Activity;Understanding of Exercise Prescription;Increase Strength and Stamina;Knowledge and understanding of Target Heart Rate Range (THRR);Able to understand and use rate of perceived exertion (RPE) scale Increase Physical Activity;Understanding of Exercise Prescription;Increase Strength and Stamina;Knowledge and understanding of Target Heart Rate Range (THRR);Able to understand and use rate of perceived exertion (RPE) scale Increase Physical Activity;Understanding of Exercise Prescription;Increase Strength and Stamina;Knowledge and understanding of Target Heart Rate Range (THRR);Able to understand and use rate of perceived exertion (RPE) scale   Comments Pt first day in [facility] orientation. Pt tolerated exercise well with an avg MET level of 2.0 with no s/sx. Pt is adjusting to exercise equipment motion with prosthesis and stated it felt a little strange, but not painful and okay to remain on assigned equipment. Pt is learning his THRR, RPE scale, and ExRx. Overall pt off to a great start. Reveiwed MEt's and goals. Pt tolerated exercise  well with an avg MET level of 1.85 with no s/sx. Working together to find exercises that work for his prosthetic. He does feel and increase in strength and stamina. He's also able to do more yard work and feels good with his progress so far Reveiwed MEt's and goals. Pt tolerated exercise well with an avg MET level of 1.95 with no s/sx. Pt would like to continue exercise on his own at [employer], but plans on traveling and is getting 3 days with us , so does not was to restart membership yet. Talked about making  sure to restart at [employer] before graduation so he has a routine he feels comfortable with his prosthetic before he graduates. He's feeling good with his progress and the modality switch to the arm ergo has been helpful in increasing UB strength. This will help him as he continues his yardwork at home Reveiwed MET's and goals. Pt tolerated exercise well with an avg MET level of 2.35 with no s/sx.Pt is feeling good with his progress and is increasing strength, stamina and is able to do more around the house and in the yard. He is traveling a lot right now, so he said his diet has been off, but they are working onit while at home Pt graduated the [employer]. Pt tolerated exercise well with an avg MET level of 2.1 with no s/sx. Pt did very well and increased his post by 110 ft for a total 1337ft. He will continue his exercise at [facility] [employer] for 30-45 mins 3-5 days a week   Expected Outcomes Will continue to progress workloads as tolerated without s/sx. Will continue to progress workloads as tolerated without s/sx. Will continue to progress workloads as tolerated without s/sx. Will continue to progress workloads as tolerated without s/sx. Pt will continue to exercise on his own and gain strength            Nutrition & Weight - Outcomes:  Pre Biometrics - 10/12/23 1356       Pre Biometrics   Waist Circumference 42 inches    Hip Circumference 38 inches    Waist to Hip Ratio 1.11 %    Triceps Skinfold 26 mm    % Body Fat 31.2 %    Grip Strength 38 kg    Flexibility 11.5 in    Single Leg Stand 2 seconds             Post Biometrics - 12/30/23 0824        Post  Biometrics   Height 5\' 10"  (1.778 m)    Weight 93.5 kg    Waist Circumference 43 inches    Hip Circumference 43.5 inches    Waist to Hip Ratio 0.99 %    BMI (Calculated) 29.58    Triceps Skinfold 23 mm    % Body Fat 31.2 %    Grip Strength 32 kg    Flexibility 10.5 in    Single Leg Stand 21 seconds              Nutrition:  Nutrition Therapy & Goals - 01/06/24 1523       Nutrition Therapy   Diet Heart Healthy Diet    Drug/Food Interactions Statins/Certain Fruits      Personal Nutrition Goals   Nutrition Goal Patient to identify strategies for reducing cardiovascular risk by attending the [employer] education and nutrition series weekly.   goal not met at this time.   Personal Goal #2 Patient to  improve diet quality by using the plate method as a guide for meal planning to include lean protein/plant protein, fruits, vegetables, whole grains, nonfat dairy as part of a well-balanced diet.   goal in progress.   Personal Goal #3 Patient to identify strategies for weight loss of 0.5-2.0# per week.   goal not met.   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 Lipids and A1c are well controlled. He does not regularly attend the [employer] education/nutrition series. He has maintained his weight since starting with our program. He did not meet weight loss goals. Patient will benefit from adherence to nutrition, exercise, and lifestyle modification.      Intervention Plan   Intervention Prescribe, educate and counsel regarding individualized specific dietary modifications aiming towards targeted core components such as weight, hypertension, lipid management, diabetes, heart failure and other comorbidities.;Nutrition handout(s) given to patient.    Expected Outcomes Short Term Goal: Understand basic principles of dietary content, such as calories, fat, sodium, cholesterol and nutrients.;Long Term Goal: Adherence to prescribed nutrition plan.             Nutrition Discharge:  Nutrition Assessments - 01/06/24 0831       Rate Your Plate Scores   Post Score 69             Education Questionnaire Score:  Knowledge Questionnaire Score - 01/06/24 0831       Knowledge Questionnaire Score   Post Score 21/24              Goals reviewed with patient. Karem feels more confident with exercise and has noticed he has more strength and stamina. .  Pt has made significant lifestyle changes and should be commended for their success. Lettie Ray, BSN Cardiac and Emergency planning/management officer

## 2024-01-18 ENCOUNTER — Encounter: Admitting: Physical Medicine and Rehabilitation

## 2024-01-31 ENCOUNTER — Other Ambulatory Visit: Payer: Self-pay | Admitting: Surgery

## 2024-03-07 ENCOUNTER — Ambulatory Visit: Admitting: Orthopedic Surgery

## 2024-03-08 ENCOUNTER — Ambulatory Visit: Admitting: Orthopedic Surgery

## 2024-03-21 ENCOUNTER — Ambulatory Visit: Admitting: Orthopedic Surgery

## 2024-03-21 ENCOUNTER — Encounter: Payer: Self-pay | Admitting: Orthopedic Surgery

## 2024-03-21 DIAGNOSIS — Z89511 Acquired absence of right leg below knee: Secondary | ICD-10-CM

## 2024-03-21 DIAGNOSIS — S88111A Complete traumatic amputation at level between knee and ankle, right lower leg, initial encounter: Secondary | ICD-10-CM

## 2024-03-21 NOTE — Progress Notes (Signed)
 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  Assessment & Plan: Visit Diagnoses:  1. Below-knee amputation of right lower extremity, initial encounter [facility])     Plan: Prescription was provided for new socket liner materials and supplies.  Patient will continue to wear his under liner.  Follow-Up Instructions: Return if symptoms worsen or fail to improve.   Ortho Exam  Patient is alert, oriented, no adenopathy, well-dressed, normal affect, normal respiratory effort. Examination patient has hypertrophic callus and ulceration over the end of the residual limb secondary to subsiding in the socket.  His liner is torn.  Patient is wearing over 15 ply sock without relief.  Patient is an existing right transtibial  amputee.  Patient's current comorbidities are not expected to impact the ability to function with the prescribed prosthesis. Patient verbally communicates a strong desire to use a prosthesis. Patient currently requires mobility aids to ambulate without a prosthesis.  Expects not to use mobility aids with a new prosthesis. Patient is expected to resume or reach their K Level within 6 months. Patient was active before the amputation and independent with stairs, uneven terrain, varying cadence, and a community ambulator.  Patient is a K3 level ambulator that spends a lot of time walking around on uneven terrain over obstacles, up and down stairs, and ambulates with a variable cadence.       Imaging: No results found. No images are attached to the  encounter.  Labs: Lab Results  Component Value Date   HGBA1C 5.3 06/23/2023   HGBA1C 5.5 06/16/2023   HGBA1C 5.4 04/15/2022   ESRSEDRATE 14 04/08/2022   CRP 2.1 04/08/2022   REPTSTATUS 06/25/2022 FINAL 06/20/2022   GRAMSTAIN  06/20/2022    ABUNDANT WBC PRESENT,BOTH PMN AND MONONUCLEAR NO ORGANISMS SEEN    CULT  06/20/2022    RARE HAEMOPHILUS PARAINFLUENZAE BETA LACTAMASE NEGATIVE FEW GEMELLA MORBILLORUM Standardized susceptibility testing for this organism is not available. Performed at [facility], [address] [place], [place] [address]      Lab Results  Component Value Date   ALBUMIN  3.6 06/20/2022   ALBUMIN  2.3 (L) 05/15/2022   ALBUMIN  3.9 08/27/2015    Lab Results  Component Value Date   MG 2.8 (H) 06/25/2023   MG 2.7 (H) 06/25/2023   MG 3.5 (H) 06/24/2023   No results found for: VD25OH  No results found for: PREALBUMIN    Latest Ref Rng & Units 06/27/2023    3:56 AM 06/26/2023    2:48 AM 06/25/2023    5:23 PM  CBC EXTENDED  WBC 4.0 - 10.5 K/uL 6.6  7.9  9.7   RBC 4.22 - 5.81 MIL/uL 2.57  2.72  3.02   Hemoglobin 13.0 - 17.0 g/dL 8.5  8.8  9.8   HCT 60.9 - 52.0 % 25.4  27.0  29.7   Platelets 150 - 400 K/uL 154  161  189      There is no height or weight on file to calculate BMI.  Orders:  No orders of the defined types were placed in this encounter.  No orders of the defined types were placed in this encounter.    Procedures: No procedures performed  Clinical Data: No additional findings.  ROS:  All other systems negative, except as noted in the HPI. Review of Systems  Objective: Vital Signs: There were no vitals taken for this visit.  Specialty Comments:  No specialty comments available.  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/24/2023   Acute postoperative respiratory insufficiency 06/24/2023   Coronary artery disease involving native coronary artery of native heart with  angina pectoris (HCC) 06/15/2023   Elevated coronary artery calcium  score 08/06/2022   Dehiscence of amputation stump of right lower extremity (HCC) 06/20/2022   Acute blood loss anemia 05/19/2022   Post-op pain 05/19/2022   Acute renal failure (HCC) 05/19/2022   S/P BKA (below knee amputation), right (HCC) 05/14/2022   Below-knee amputation of right lower extremity (HCC) 05/09/2022   Gangrene of right foot (HCC)    PAD (peripheral artery disease) (HCC) 04/17/2022   Acute osteomyelitis of metatarsal bone of right foot (HCC)    Atherosclerosis of native arteries of the extremities with ulceration (HCC) 04/15/2022   Peripheral arterial disease (HCC) 03/25/2022   Obesity 03/21/2022   Prediabetes 03/21/2022   Erectile dysfunction 07/26/2021   Family history of ischemic heart disease (IHD) 07/26/2021   Pure hypercholesterolemia 07/26/2021   History of adenomatous polyp of colon 02/03/2019   Internal hemorrhoids 02/03/2019   Carotid artery stenosis, asymptomatic 09/05/2015   Cellulitis    Hypertension    Hyperlipidemia    Occlusion and stenosis of carotid artery without mention of cerebral infarction 01/01/2012   Past Medical History:  Diagnosis Date   Carotid artery occlusion    Cellulitis    Erectile dysfunction    Hyperlipidemia    Hypertension    Peripheral vascular disease (HCC)     Family History  Problem Relation Age of Onset   Other Mother        circulatory problems   Heart attack Mother    Deep vein thrombosis Mother    Heart disease Mother    Hyperlipidemia Father    Hypertension Father    Diabetes Father    Deep vein thrombosis Father    Heart disease Father        before age 65   Heart attack Father    Peripheral vascular disease Father    Hypertension Brother    Hyperlipidemia Brother     Past Surgical History:  Procedure Laterality Date   ABDOMINAL AORTOGRAM W/LOWER EXTREMITY N/A 02/04/2022   Procedure: ABDOMINAL AORTOGRAM W/LOWER EXTREMITY;  Surgeon:  Serene Gaile ORN, MD;  [facility];  Service: Cardiovascular;  Laterality: N/A;   ABDOMINAL AORTOGRAM W/LOWER EXTREMITY N/A 02/18/2022   Procedure: ABDOMINAL AORTOGRAM W/LOWER EXTREMITY;  Surgeon: Serene Gaile ORN, MD;  [facility];  Service: Cardiovascular;  Laterality: N/A;   ABDOMINAL AORTOGRAM W/LOWER EXTREMITY N/A 04/15/2022   Procedure: ABDOMINAL AORTOGRAM W/LOWER EXTREMITY;  Surgeon: Serene Gaile ORN, MD;  [facility]: [facility];  Service: Cardiovascular;  Laterality: N/A;   ABDOMINAL AORTOGRAM W/LOWER EXTREMITY N/A 10/28/2022   Procedure: ABDOMINAL AORTOGRAM W/LOWER EXTREMITY;  Surgeon: Serene Gaile ORN, MD;  [facility];  Service:  Cardiovascular;  Laterality: N/A;   AMPUTATION Right 04/16/2022   Procedure: RIGHT 5TH RAY AMPUTATION;  Surgeon: Harden Jerona GAILS, MD;  Location: [facility] OR;  Service: Orthopedics;  Laterality: Right;   AMPUTATION Right 05/09/2022   Procedure: RIGHT BELOW KNEE AMPUTATION;  Surgeon: Harden Jerona GAILS, MD;  Location: [facility] OR;  Service: Orthopedics;  Laterality: Right;   APPLICATION OF WOUND VAC Right 06/20/2022   Procedure: APPLICATION OF WOUND VAC;  Surgeon: Harden Jerona GAILS, MD;  [facility];  Service: Orthopedics;  Laterality: Right;   COLONOSCOPY     CORONARY ARTERY BYPASS GRAFT N/A 06/24/2023   Procedure: CORONARY ARTERY BYPASS GRAFTING (CABG) X TWO USING LEFT INTERNAL MAMMARY ARTERY AND RIGHT GREATER SAPHENOUS VEIN HARVESTED ENDOSCOPICALLY;  Surgeon: Shyrl Linnie KIDD, MD;  Location: [facility];  Service: Open Heart Surgery;  Laterality: N/A;   ENDARTERECTOMY Right 09/05/2015   Procedure: ENDARTERECTOMY CAROTID RIGHT;  Surgeon: Carlin FORBES Haddock, MD;  Location: [facility] OR;  Service: Vascular;  Laterality: Right;   LEFT HEART CATH AND CORONARY ANGIOGRAPHY N/A 06/15/2023   Procedure: LEFT HEART CATH AND CORONARY ANGIOGRAPHY;  Surgeon: Wendel Lurena POUR, MD;  [facility];  Service: Cardiovascular;  Laterality: N/A;    PATCH ANGIOPLASTY  09/05/2015   Procedure: PATCH ANGIOPLASTY RIGHT CAROTID ARTERY USING HEMASHIELD PLATINUM FINESSE PATCH;  Surgeon: Carlin FORBES Haddock, MD;  Location: [facility] OR;  Service: Vascular;;   PERIPHERAL INTRAVASCULAR LITHOTRIPSY Left 10/28/2022   Procedure: INTRAVASCULAR LITHOTRIPSY;  Surgeon: Serene Gaile ORN, MD;  [facility];  Service: Cardiovascular;  Laterality: Left;  L peroneal   PERIPHERAL VASCULAR ATHERECTOMY Left 02/18/2022   Procedure: PERIPHERAL VASCULAR ATHERECTOMY;  Surgeon: Serene Gaile ORN, MD;  [facility];  Service: Cardiovascular;  Laterality: Left;  Popliteal and Peroneal   PERIPHERAL VASCULAR BALLOON ANGIOPLASTY  02/04/2022   Procedure: PERIPHERAL VASCULAR BALLOON ANGIOPLASTY;  Surgeon: Serene Gaile ORN, MD;  [facility];  Service: Cardiovascular;;   PERIPHERAL VASCULAR BALLOON ANGIOPLASTY Right 04/15/2022   Procedure: PERIPHERAL VASCULAR BALLOON ANGIOPLASTY;  Surgeon: Serene Gaile ORN, MD;  [facility];  Service: Cardiovascular;  Laterality: Right;  SFA/POPLITEAL   PERIPHERAL VASCULAR BALLOON ANGIOPLASTY Left 10/28/2022   Procedure: PERIPHERAL VASCULAR BALLOON ANGIOPLASTY;  Surgeon: Serene Gaile ORN, MD;  [facility];  Service: Cardiovascular;  Laterality: Left;  L peroneal and L pop   STUMP REVISION Right 06/20/2022   Procedure: REVISION RIGHT BELOW KNEE AMPUTATION;  Surgeon: Harden Jerona GAILS, MD;  Location[facility] OR;  Service: Orthopedics;  Laterality: Right;   TEE WITHOUT CARDIOVERSION N/A 06/24/2023   Procedure: TRANSESOPHAGEAL ECHOCARDIOGRAM;  Surgeon: Shyrl Linnie KIDD, MD;  [facility];  Service: Open Heart Surgery;  Laterality: N/A;   TONSILLECTOMY     VASECTOMY     Social History   Occupational History   Not on file  Tobacco Use   Smoking status: Never   Smokeless tobacco: Former    Types: Chew    Quit date: 01/01/2015  Vaping Use   Vaping status: Never Used  Substance  and Sexual Activity   Alcohol use: Not Currently   Drug use: Not on file   Sexual activity: Not on file

## 2024-03-30 ENCOUNTER — Encounter: Payer: Self-pay | Admitting: Orthopedic Surgery

## 2024-03-31 ENCOUNTER — Telehealth: Payer: Self-pay | Admitting: Orthopedic Surgery

## 2024-03-31 NOTE — Telephone Encounter (Signed)
 Patient called and said call him to set the appointment for [facility]. RA#[phone]

## 2024-03-31 NOTE — Telephone Encounter (Signed)
 Please call pt. I offered appt with [place] tomorrow at 2. If he would like to see Dr. Harden  then please make for the next available day. This is for back pian.

## 2024-04-01 ENCOUNTER — Encounter: Payer: Self-pay | Admitting: Physician Assistant

## 2024-04-01 ENCOUNTER — Other Ambulatory Visit: Payer: Self-pay

## 2024-04-01 ENCOUNTER — Ambulatory Visit: Admitting: Physician Assistant

## 2024-04-01 DIAGNOSIS — M25551 Pain in right hip: Secondary | ICD-10-CM | POA: Diagnosis not present

## 2024-04-01 MED ORDER — PREDNISONE 10 MG PO TABS: 10.0000 mg | ORAL_TABLET | Freq: Every day | ORAL | 0 refills | Status: AC

## 2024-04-01 NOTE — Progress Notes (Signed)
 Office Visit Note   Patient: Clinton [name]           Date of Birth: 31-Dec-1955           MRN: [id number] Visit Date: 04/01/2024              Requested by: Regino Slater, MD [address] [place],  [place] [address] PCP: Regino Slater, MD  No chief complaint on file.     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 He comes in today with reports of right posterior and anterior hip pain.  He stats about 10 days ago he slipped and possible twisted his right hip.  He then developed right hip pain which caused him to walk with his body forwardly flexed.  He recently went to see a chiropractor which seems to be helping.  He is walking with an antalgic gait.  He is in the process of adjustments with his right BKA prosthetic which is subsiding in the socket. His liner is torn. Patient is wearing over 15 ply sock without relief.   Assessment & Plan: Visit Diagnoses: No diagnosis found.  Plan: He will f/u with the chiropractor as needed.  I will prescribe prednisone  10 mg every day until the pain has improved.  I gave him a copy of the AP pelvis x ray to show to [employer] tech to see if the prosthetic needs to be lengthened/adjusted.    Follow-Up Instructions: No follow-ups on file.   Ortho Exam  Patient is alert, oriented, no adenopathy, well-dressed, normal affect, normal respiratory effort. Minimal hip internal/external rotation, antalgic gain due to mild pain in the right hip with weight bearing.  Hip flexion with resistance does not cause pain.      Imaging: AP pelvis shows right pelvic tip.  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  Calcified CF, SFA and Profunda artery.  Significant DDD with spondylosis.  Labs: Lab Results  Component Value Date   HGBA1C 5.3 06/23/2023   HGBA1C 5.5 06/16/2023   HGBA1C 5.4 04/15/2022   ESRSEDRATE 14 04/08/2022   CRP 2.1 04/08/2022   REPTSTATUS 06/25/2022 FINAL 06/20/2022    GRAMSTAIN  06/20/2022    ABUNDANT WBC PRESENT,BOTH PMN AND MONONUCLEAR NO ORGANISMS SEEN    CULT  06/20/2022    RARE HAEMOPHILUS PARAINFLUENZAE BETA LACTAMASE NEGATIVE FEW GEMELLA MORBILLORUM Standardized susceptibility testing for this organism is not available. Performed at [facility], [address] [place], [place] [address]      Lab Results  Component Value Date   ALBUMIN  3.6 06/20/2022   ALBUMIN  2.3 (L) 05/15/2022   ALBUMIN  3.9 08/27/2015    Lab Results  Component Value Date   MG 2.8 (H) 06/25/2023   MG 2.7 (H) 06/25/2023   MG 3.5 (H) 06/24/2023   No results found for: VD25OH  No results found for: PREALBUMIN    Latest Ref Rng & Units 06/27/2023    3:56 AM 06/26/2023    2:48 AM 06/25/2023    5:23 PM  CBC EXTENDED  WBC 4.0 - 10.5 K/uL 6.6  7.9  9.7   RBC 4.22 - 5.81 MIL/uL 2.57  2.72  3.02   Hemoglobin 13.0 - 17.0 g/dL 8.5  8.8  9.8   HCT 60.9 - 52.0 % 25.4  27.0  29.7   Platelets 150 - 400 K/uL 154  161  189      There is no height or weight  on file to calculate BMI.  Orders:  No orders of the defined types were placed in this encounter.  No orders of the defined types were placed in this encounter.    Procedures: No procedures performed  Clinical Data: No additional findings.  ROS:  All other systems negative, except as noted in the HPI. Review of Systems  Objective: Vital Signs: There were no vitals taken for this visit.  Specialty Comments:  No specialty comments available.  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/24/2023   Acute postoperative respiratory insufficiency 06/24/2023   Coronary artery disease involving native coronary artery of native heart with angina pectoris (HCC) 06/15/2023   Elevated coronary artery calcium  score 08/06/2022   Dehiscence of amputation stump of right lower extremity (HCC) 06/20/2022   Acute blood loss anemia 05/19/2022   Post-op pain  05/19/2022   Acute renal failure (HCC) 05/19/2022   S/P BKA (below knee amputation), right (HCC) 05/14/2022   Below-knee amputation of right lower extremity (HCC) 05/09/2022   Gangrene of right foot (HCC)    PAD (peripheral artery disease) (HCC) 04/17/2022   Acute osteomyelitis of metatarsal bone of right foot (HCC)    Atherosclerosis of native arteries of the extremities with ulceration (HCC) 04/15/2022   Peripheral arterial disease (HCC) 03/25/2022   Obesity 03/21/2022   Prediabetes 03/21/2022   Erectile dysfunction 07/26/2021   Family history of ischemic heart disease (IHD) 07/26/2021   Pure hypercholesterolemia 07/26/2021   History of adenomatous polyp of colon 02/03/2019   Internal hemorrhoids 02/03/2019   Carotid artery stenosis, asymptomatic 09/05/2015   Cellulitis    Hypertension    Hyperlipidemia    Occlusion and stenosis of carotid artery without mention of cerebral infarction 01/01/2012   Past Medical History:  Diagnosis Date   Carotid artery occlusion    Cellulitis    Erectile dysfunction    Hyperlipidemia    Hypertension    Peripheral vascular disease (HCC)     Family History  Problem Relation Age of Onset   Other Mother        circulatory problems   Heart attack Mother    Deep vein thrombosis Mother    Heart disease Mother    Hyperlipidemia Father    Hypertension Father    Diabetes Father    Deep vein thrombosis Father    Heart disease Father        before age 29   Heart attack Father    Peripheral vascular disease Father    Hypertension Brother    Hyperlipidemia Brother     Past Surgical History:  Procedure Laterality Date   ABDOMINAL AORTOGRAM W/LOWER EXTREMITY N/A 02/04/2022   Procedure: ABDOMINAL AORTOGRAM W/LOWER EXTREMITY;  Surgeon: Serene Gaile ORN, MD;  [facility];  Service: Cardiovascular;  Laterality: N/A;   ABDOMINAL AORTOGRAM W/LOWER EXTREMITY N/A 02/18/2022   Procedure: ABDOMINAL AORTOGRAM W/LOWER EXTREMITY;  Surgeon:  Serene Gaile ORN, MD;  [facility];  Service: Cardiovascular;  Laterality: N/A;   ABDOMINAL AORTOGRAM W/LOWER EXTREMITY N/A 04/15/2022   Procedure: ABDOMINAL AORTOGRAM W/LOWER EXTREMITY;  Surgeon: Serene Gaile ORN, MD;  [facility];  Service: Cardiovascular;  Laterality: N/A;   ABDOMINAL AORTOGRAM W/LOWER EXTREMITY N/A 10/28/2022   Procedure: ABDOMINAL AORTOGRAM W/LOWER EXTREMITY;  Surgeon: Serene Gaile ORN, MD;  [facility];  Service: Cardiovascular;  Laterality: N/A;   AMPUTATION Right 04/16/2022   Procedure: RIGHT 5TH  RAY AMPUTATION;  Surgeon: Harden Jerona GAILS, MD;  Location: [facility] OR;  Service: Orthopedics;  Laterality: Right;   AMPUTATION Right 05/09/2022   Procedure: RIGHT BELOW KNEE AMPUTATION;  Surgeon: Harden Jerona GAILS, MD;  Location: [facility] OR;  Service: Orthopedics;  Laterality: Right;   APPLICATION OF WOUND VAC Right 06/20/2022   Procedure: APPLICATION OF WOUND VAC;  Surgeon: Harden Jerona GAILS, MD;  [facility];  Service: Orthopedics;  Laterality: Right;   COLONOSCOPY     CORONARY ARTERY BYPASS GRAFT N/A 06/24/2023   Procedure: CORONARY ARTERY BYPASS GRAFTING (CABG) X TWO USING LEFT INTERNAL MAMMARY ARTERY AND RIGHT GREATER SAPHENOUS VEIN HARVESTED ENDOSCOPICALLY;  Surgeon: Shyrl Linnie KIDD, MD;  [facility];  Service: Open Heart Surgery;  Laterality: N/A;   ENDARTERECTOMY Right 09/05/2015   Procedure: ENDARTERECTOMY CAROTID RIGHT;  Surgeon: Carlin FORBES Haddock, MD;  Location: [facility] OR;  Service: Vascular;  Laterality: Right;   LEFT HEART CATH AND CORONARY ANGIOGRAPHY N/A 06/15/2023   Procedure: LEFT HEART CATH AND CORONARY ANGIOGRAPHY;  Surgeon: Wendel Lurena POUR, MD;  [facility];  Service: Cardiovascular;  Laterality: N/A;   PATCH ANGIOPLASTY  09/05/2015   Procedure: PATCH ANGIOPLASTY RIGHT CAROTID ARTERY USING HEMASHIELD PLATINUM FINESSE PATCH;  Surgeon: Carlin FORBES Haddock, MD;  Location: [facility] OR;  Service: Vascular;;   PERIPHERAL  INTRAVASCULAR LITHOTRIPSY Left 10/28/2022   Procedure: INTRAVASCULAR LITHOTRIPSY;  Surgeon: Serene Gaile ORN, MD;  [facility];  Service: Cardiovascular;  Laterality: Left;  L peroneal   PERIPHERAL VASCULAR ATHERECTOMY Left 02/18/2022   Procedure: PERIPHERAL VASCULAR ATHERECTOMY;  Surgeon: Serene Gaile ORN, MD;  [facility];  Service: Cardiovascular;  Laterality: Left;  Popliteal and Peroneal   PERIPHERAL VASCULAR BALLOON ANGIOPLASTY  02/04/2022   Procedure: PERIPHERAL VASCULAR BALLOON ANGIOPLASTY;  Surgeon: Serene Gaile ORN, MD;  [facility];  Service: Cardiovascular;;   PERIPHERAL VASCULAR BALLOON ANGIOPLASTY Right 04/15/2022   Procedure: PERIPHERAL VASCULAR BALLOON ANGIOPLASTY;  Surgeon: Serene Gaile ORN, MD;  [facility];  Service: Cardiovascular;  Laterality: Right;  SFA/POPLITEAL   PERIPHERAL VASCULAR BALLOON ANGIOPLASTY Left 10/28/2022   Procedure: PERIPHERAL VASCULAR BALLOON ANGIOPLASTY;  Surgeon: Serene Gaile ORN, MD;  [facility];  Service: Cardiovascular;  Laterality: Left;  L peroneal and L pop   STUMP REVISION Right 06/20/2022   Procedure: REVISION RIGHT BELOW KNEE AMPUTATION;  Surgeon: Harden Jerona GAILS, MD;  Location: [facility] OR;  Service: Orthopedics;  Laterality: Right;   TEE WITHOUT CARDIOVERSION N/A 06/24/2023   Procedure: TRANSESOPHAGEAL ECHOCARDIOGRAM;  Surgeon: Shyrl Linnie KIDD, MD;  [facility];  Service: Open Heart Surgery;  Laterality: N/A;   TONSILLECTOMY     VASECTOMY     Social History   Occupational History   Not on file  Tobacco Use   Smoking status: Never   Smokeless tobacco: Former    Types: Chew    Quit date: 01/01/2015  Vaping Use   Vaping status: Never Used  Substance and Sexual Activity   Alcohol use: Not Currently   Drug use: Not on file   Sexual activity: Not on file

## 2024-04-06 ENCOUNTER — Telehealth: Payer: Self-pay

## 2024-04-06 NOTE — Telephone Encounter (Signed)
 Addie Lolita DEL, RN  Serene Gaile ORN, MD Pt's wife called asking for a follow-up with you because they are nervous about the pt's good foot.  They went to the podiatrist and he has a bruise on top of his foot and he doesn't know how it got there.  She did report that the podiatrist said he has good pulse in that foot.  She reported no non-healing wounds and no claudication.  She says they have had a hard several weeks because he is having back and spine problems.  She is just very nervous about the left foot and leg.    Please advise.  Lolita

## 2024-04-15 ENCOUNTER — Other Ambulatory Visit: Payer: Self-pay | Admitting: *Deleted

## 2024-04-15 DIAGNOSIS — I7025 Atherosclerosis of native arteries of other extremities with ulceration: Secondary | ICD-10-CM

## 2024-04-15 DIAGNOSIS — I739 Peripheral vascular disease, unspecified: Secondary | ICD-10-CM

## 2024-04-18 ENCOUNTER — Ambulatory Visit (HOSPITAL_COMMUNITY)
Admission: RE | Admit: 2024-04-18 | Discharge: 2024-04-18 | Disposition: A | Discharge status: Home / Self Care | Source: Ambulatory Visit | Attending: Surgery | Admitting: Surgery

## 2024-04-18 ENCOUNTER — Ambulatory Visit (HOSPITAL_BASED_OUTPATIENT_CLINIC_OR_DEPARTMENT_OTHER)
Admission: RE | Admit: 2024-04-18 | Discharge: 2024-04-18 | Disposition: A | Discharge status: Home / Self Care | Source: Ambulatory Visit | Attending: Surgery | Admitting: Surgery

## 2024-04-18 DIAGNOSIS — I7025 Atherosclerosis of native arteries of other extremities with ulceration: Secondary | ICD-10-CM

## 2024-04-18 DIAGNOSIS — I739 Peripheral vascular disease, unspecified: Secondary | ICD-10-CM | POA: Insufficient documentation

## 2024-04-19 ENCOUNTER — Encounter: Payer: Self-pay | Admitting: Orthopedic Surgery

## 2024-04-19 LAB — VAS US ABI WITH/WO TBI: Left ABI: 0.83

## 2024-04-21 ENCOUNTER — Ambulatory Visit: Attending: Physician Assistant | Admitting: Physician Assistant

## 2024-04-21 VITALS — BP 134/74 | HR 64 | Ht 69.0 in | Wt 208.0 lb

## 2024-04-21 DIAGNOSIS — I10 Essential (primary) hypertension: Secondary | ICD-10-CM

## 2024-04-21 DIAGNOSIS — I25810 Atherosclerosis of coronary artery bypass graft(s) without angina pectoris: Secondary | ICD-10-CM | POA: Diagnosis not present

## 2024-04-21 DIAGNOSIS — E785 Hyperlipidemia, unspecified: Secondary | ICD-10-CM | POA: Diagnosis not present

## 2024-04-21 DIAGNOSIS — I739 Peripheral vascular disease, unspecified: Secondary | ICD-10-CM | POA: Diagnosis not present

## 2024-04-21 NOTE — Patient Instructions (Signed)
 Medication Instructions:  Your physician recommends that you continue on your current medications as directed. Please refer to the Current Medication list given to you today.  *If you need a refill on your cardiac medications before your next appointment, please call your pharmacy*  Lab Work: None ordered  If you have labs (blood work) drawn today and your tests are completely normal, you will receive your results only by: MyChart Message (if you have MyChart) OR A paper copy in the mail If you have any lab test that is abnormal or we need to change your treatment, we will call you to review the results.  Testing/Procedures: None ordered  Follow-Up: At [facility] you and your health needs are our priority.  As part of our continuing mission to provide you with exceptional heart care, our providers are all part of one team.  This team includes your primary Cardiologist (physician) and Advanced Practice Providers or APPs (Physician Assistants and Nurse Practitioners) who all work together to provide you with the care you need, when you need it.  Your next appointment:   6 month(s)  Provider:   Dorn Lesches, MD    We recommend signing up for the patient portal called MyChart.  Sign up information is provided on this After Visit Summary.  MyChart is used to connect with patients for Virtual Visits (Telemedicine).  Patients are able to view lab/test results, encounter notes, upcoming appointments, etc.  Non-urgent messages can be sent to your provider as well.   To learn more about what you can do with MyChart, go to [email or web address].   Other Instructions If your LDL on your blood work is higher then 55, let us  know.

## 2024-04-21 NOTE — Progress Notes (Unsigned)
 Cardiology Office Note   Date:  04/23/2024  ID:  Clinton [name], DOB 1955/08/16, MRN [id number] PCP: Regino Slater, MD  [facility] Providers Cardiologist:  Dorn Lesches, MD     History of Present Illness Clinton [name] is a 68 y.o. male with PMH of HTN, HLD, PAD, RBBB, CAD s/p CABG and carotid artery disease s/p R CEA on 09/05/2015.  He has significant family history of heart issue with his father died of MI at the age of 89, mother at age 55, younger brother had CABG.  He himself has never had a heart attack or stroke.  He has been followed by Dr. Serene for critical limb ischemia and had endovascular therapy for his right popliteal artery, left popliteal artery and peroneal artery.  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  He had a calcium  scoring test on 07/01/2022 which showed calcium  score of 1756 majority of which is in the LAD and RCA territory, he was completely asymptomatic.  Subsequent Myoview  obtained on 07/18/2022 was normal.  Echocardiogram obtained on 07/09/2022 shows EF 60 to 65%, mild LVH of the basal septal segment, grade 1 DD.  Patient underwent lower extremity angiography on 10/28/2022 which showed widely patent left common femoral and profundofemoral artery, heavily calcified but patent left SFA.  Below-knee popliteal artery is heavily calcified and diffusely diseased was 80 to 90% stenosis in the distal popliteal artery at the level of the anterior tibial artery.  This was treated with with drug-coated balloon angioplasty. Cardiac catheterization performed on 06/15/2023 demonstrated 95% left main, 50% D1, LVEDP 36 mmHg.  Preoperative carotid Doppler obtained in 06/16/2023 showed 1 to 39% bilateral ICA disease, greater than 50% ECA disease on the left.  He ultimately underwent CABG x 2 with LIMA-LAD and SVG to diagonal by Dr. Shyrl on 06/24/2023. He was able to maintain sinus rhythm in the postop period.    He was  last seen by Dr. Lesches in March 2025 at which time he was doing well.  Recent ABI on the left was 0.83, he has BKA on the right.  Lower extremity arterial Doppler showed mild disease in the superficial femoral artery and peroneal artery on the left.  Patient presents today for follow-up.  He is doing well from the cardiac perspective.  He denies any recent chest pain.  He has no left lower extremity edema.  He is wearing compression stocking.  He has no orthopnea or PND.  He did hurt his back a few weeks ago.  Overall, he is doing very well and can follow-up with Dr. Lesches in 6 months.  He is due for annual blood work at her PCPs office next week, his LDL goal should be less than 55, last lipid panel obtained in November 2024 continued to show LDL of 70.  If his repeat LDL is still greater than 55, he will need to let us  know.  If that is the case, I likely will consider PCSK9 inhibitor.    ROS:   He denies chest pain, palpitations, dyspnea, pnd, orthopnea, n, v, dizziness, syncope, edema, weight gain, or early satiety. All other systems reviewed and are otherwise negative except as noted above.    Studies Reviewed EKG Interpretation Date/Time:  Thursday April 21 2024 08:37:46 EDT Ventricular Rate:  66 PR Interval:  170 QRS Duration:  134 QT Interval:  444 QTC Calculation: 465 R Axis:   -48  Text Interpretation: Sinus rhythm with Premature  atrial complexes Right bundle branch block Left anterior fascicular block Bifascicular block When compared with ECG of 16-Jul-2023 08:33, Premature atrial complexes are now Present T wave inversion now evident in Inferior leads Nonspecific T wave abnormality has replaced inverted T waves in Anterior leads Confirmed by Kenzey Birkland [phone]) on 04/21/2024 8:39:51 AM    Cardiac Studies & Procedures   ______________________________________________________________________________________________ CARDIAC CATHETERIZATION  CARDIAC CATHETERIZATION  06/15/2023  Conclusion   Ost LM to Mid LM lesion is 95% stenosed.   1st Diag lesion is 50% stenosed.  1.  95% left main lesion. 2.  LVEDP of 36 mmHg.  Recommendation: Will admit and obtain cardiothoracic surgical evaluation.  Will administer Lasix  20 mg IV x 1 given elevated LVEDP.  Findings Coronary Findings Diagnostic  Dominance: Right  Left Main Ost LM to Mid LM lesion is 95% stenosed.  Left Anterior Descending The vessel exhibits minimal luminal irregularities.  First Diagonal Branch 1st Diag lesion is 50% stenosed.  Right Coronary Artery The vessel exhibits minimal luminal irregularities.  Intervention  No interventions have been documented.   STRESS TESTS  MYOCARDIAL PERFUSION IMAGING 07/18/2022  Interpretation Summary   The study is normal. The study is low risk.   No ST deviation was noted. Arrhythmias during recovery: frequent PVCs.   LV perfusion is normal. There is no evidence of ischemia. There is no evidence of infarction. Significant extracardiac uptake is present, however wall motion appears normal and likely does not impact interpretation of perfusion.   Left ventricular function is normal. Calculated ejection fraction performed but not reported, visually appears 60%. End diastolic cavity size is normal.   Prior study available for comparison from 08/15/2015. No changes compared to prior study.   ECHOCARDIOGRAM  ECHOCARDIOGRAM COMPLETE 06/16/2023  Narrative ECHOCARDIOGRAM REPORT    Patient Name:   Clinton [name] Date of Exam: 06/16/2023 Medical Rec #:  [id number]         Height:       70.0 in Accession #:    [id number]        Weight:       196.0 lb Date of Birth:  10-28-1955        BSA:          2.069 m Patient Age:    67 years          BP:           136/60 mmHg Patient Gender: M                 HR:           81 bpm. Exam Location:  Inpatient  Procedure: 2D Echo, Color Doppler, Cardiac Doppler and Intracardiac Opacification  Agent  Indications:    Pre-op cardiovascular exam  History:        Patient has prior history of Echocardiogram examinations, most recent 07/09/2022. CAD, PAD and Carotid Disease; Risk Factors:Hypertension and Dyslipidemia.  Sonographer:    Eleanor Lincoln Referring Phys: [id number] HARRELL O LIGHTFOOT  IMPRESSIONS   1. Left ventricular ejection fraction, by estimation, is 55 to 60%. The left ventricle has normal function. The left ventricle has no regional wall motion abnormalities. Left ventricular diastolic parameters are consistent with Grade I diastolic dysfunction (impaired relaxation). 2. Right ventricular systolic function is normal. The right ventricular size is normal. There is normal pulmonary artery systolic pressure. The estimated right ventricular systolic pressure is 34.6 mmHg. 3. The mitral valve is grossly normal. Trivial mitral valve regurgitation. No evidence of mitral  stenosis. 4. The aortic valve is tricuspid. Aortic valve regurgitation is not visualized. No aortic stenosis is present. 5. The inferior vena cava is normal in size with greater than 50% respiratory variability, suggesting right atrial pressure of 3 mmHg.  Comparison(s): No significant change from prior study.  FINDINGS Left Ventricle: Left ventricular ejection fraction, by estimation, is 55 to 60%. The left ventricle has normal function. The left ventricle has no regional wall motion abnormalities. Definity  contrast agent was given IV to delineate the left ventricular endocardial borders. The left ventricular internal cavity size was normal in size. There is no left ventricular hypertrophy. Left ventricular diastolic parameters are consistent with Grade I diastolic dysfunction (impaired relaxation).  Right Ventricle: The right ventricular size is normal. No increase in right ventricular wall thickness. Right ventricular systolic function is normal. There is normal pulmonary artery systolic pressure. The  tricuspid regurgitant velocity is 2.81 m/s, and with an assumed right atrial pressure of 3 mmHg, the estimated right ventricular systolic pressure is 34.6 mmHg.  Left Atrium: Left atrial size was normal in size.  Right Atrium: Right atrial size was normal in size.  Pericardium: Trivial pericardial effusion is present. Presence of epicardial fat layer.  Mitral Valve: The mitral valve is grossly normal. Trivial mitral valve regurgitation. No evidence of mitral valve stenosis.  Tricuspid Valve: The tricuspid valve is grossly normal. Tricuspid valve regurgitation is trivial. No evidence of tricuspid stenosis.  Aortic Valve: The aortic valve is tricuspid. Aortic valve regurgitation is not visualized. No aortic stenosis is present. Aortic valve mean gradient measures 4.0 mmHg. Aortic valve peak gradient measures 7.0 mmHg. Aortic valve area, by VTI measures 2.84 cm.  Pulmonic Valve: The pulmonic valve was grossly normal. Pulmonic valve regurgitation is not visualized. No evidence of pulmonic stenosis.  Aorta: The aortic root and ascending aorta are structurally normal, with no evidence of dilitation.  Venous: The inferior vena cava is normal in size with greater than 50% respiratory variability, suggesting right atrial pressure of 3 mmHg.  IAS/Shunts: The atrial septum is grossly normal.   LEFT VENTRICLE PLAX 2D LVIDd:         5.00 cm   Diastology LVIDs:         2.40 cm   LV e' medial:    6.09 cm/s LV PW:         1.00 cm   LV E/e' medial:  10.9 LV IVS:        1.10 cm   LV e' lateral:   10.90 cm/s LVOT diam:     2.30 cm   LV E/e' lateral: 6.1 LV SV:         70 LV SV Index:   34 LVOT Area:     4.15 cm   RIGHT VENTRICLE RV Basal diam:  2.90 cm RV S prime:     17.10 cm/s TAPSE (M-mode): 1.8 cm  LEFT ATRIUM             Index        RIGHT ATRIUM           Index LA diam:        4.20 cm 2.03 cm/m   RA Area:     10.40 cm LA Vol (A2C):   51.3 ml 24.79 ml/m  RA Volume:   19.00 ml  9.18  ml/m LA Vol (A4C):   42.6 ml 20.58 ml/m LA Biplane Vol: 48.7 ml 23.53 ml/m AORTIC VALVE AV Area (Vmax):    2.66 cm  AV Area (Vmean):   2.52 cm AV Area (VTI):     2.84 cm AV Vmax:           132.00 cm/s AV Vmean:          88.200 cm/s AV VTI:            0.246 m AV Peak Grad:      7.0 mmHg AV Mean Grad:      4.0 mmHg LVOT Vmax:         84.40 cm/s LVOT Vmean:        53.600 cm/s LVOT VTI:          0.168 m LVOT/AV VTI ratio: 0.68  AORTA Ao Root diam: 3.70 cm Ao Asc diam:  3.50 cm  MITRAL VALVE               TRICUSPID VALVE MV Area (PHT): 3.39 cm    TR Peak grad:   31.6 mmHg MV Decel Time: 224 msec    TR Vmax:        281.00 cm/s MV E velocity: 66.30 cm/s MV A velocity: 85.30 cm/s  SHUNTS MV E/A ratio:  0.78        Systemic VTI:  0.17 m Systemic Diam: 2.30 cm  Darryle Decent MD Electronically signed by Darryle Decent MD Signature Date/Time: 06/16/2023/10:31:49 AM    Final   TEE  ECHO INTRAOPERATIVE TEE 06/24/2023  Narrative *INTRAOPERATIVE TRANSESOPHAGEAL REPORT *    Patient Name:   Clinton [name] Date of Exam: 06/24/2023 Medical Rec #:  [id number]         Height:       70.0 in Accession #:    [id number]        Weight:       195.0 lb Date of Birth:  05-08-1956        BSA:          2.06 m Patient Age:    67 years          BP:           123/65 mmHg Patient Gender: M                 HR:           45 bpm. Exam Location:  Anesthesiology  Transesophogeal exam was perform intraoperatively during surgical procedure. Patient was closely monitored under general anesthesia during the entirety of examination.  Indications:     CAD Performing Phys: [id number] HARRELL O LIGHTFOOT  Complications: No known complications during this procedure. POST-OP IMPRESSIONS _ Left Ventricle: The left ventricle is unchanged from pre-bypass. _ Right Ventricle: The right ventricle appears unchanged from pre-bypass. _ Aorta: The aorta appears unchanged from pre-bypass. _ Left Atrial  Appendage: The left atrial appendage appears unchanged from pre-bypass. _ Aortic Valve: The aortic valve appears unchanged from pre-bypass. _ Mitral Valve: The mitral valve appears unchanged from pre-bypass. _ Tricuspid Valve: The tricuspid valve appears unchanged from pre-bypass. _ Pulmonic Valve: The pulmonic valve appears unchanged from pre-bypass. _ Interatrial Septum: The interatrial septum appears unchanged from pre-bypass. _ Pericardium: The pericardium appears unchanged from pre-bypass. _ Comments: - S/P CABG X 2. No new or worsening wall motion or valvular issues.  PRE-OP FINDINGS Left Ventricle: The left ventricle has normal systolic function, with an ejection fraction of 60-65%. The cavity size was normal. No evidence of left ventricular regional wall motion abnormalities. Left ventricular diastolic parameters were normal.   Right Ventricle: The right ventricle  has normal systolic function. The cavity was normal. There is no increase in right ventricular wall thickness.  Left Atrium: Left atrial size was normal in size. No left atrial/left atrial appendage thrombus was detected. Left atrial appendage velocity is normal at greater than 40 cm/s.  Right Atrium: Right atrial size was normal in size.  Interatrial Septum: No atrial level shunt detected by color flow Doppler. The interatrial septum appears to be lipomatous. There is no evidence of a patent foramen ovale.  Pericardium: There is no evidence of pericardial effusion. There is no pleural effusion.  Mitral Valve: The mitral valve is normal in structure. Mitral valve regurgitation is trivial by color flow Doppler. There is no evidence of mitral valve vegetation. There is No evidence of mitral stenosis.  Tricuspid Valve: The tricuspid valve was normal in structure. Tricuspid valve regurgitation is trivial by color flow Doppler. No evidence of tricuspid stenosis is present. There is no evidence of tricuspid valve  vegetation.  Aortic Valve: The aortic valve is tricuspid Aortic valve regurgitation was not visualized by color flow Doppler. There is no stenosis of the aortic valve, with a calculated valve area of 3.47 cm. There is no evidence of aortic valve vegetation.   Pulmonic Valve: The pulmonic valve was normal in structure. Pulmonic valve regurgitation is trivial by color flow Doppler.   Aorta: The aortic root and ascending aorta are normal in size and structure.  Venous: The inferior vena cava is normal in size with less than 50% respiratory variability, suggesting right atrial pressure of 8 mmHg.  Shunts: There is no evidence of an atrial septal defect.  +--------------+--------++ LEFT VENTRICLE          +----------------+---------++ +--------------+--------++  Diastology                PLAX 2D                 +----------------+---------++ +--------------+--------++  LV e' lateral:  7.31 cm/s LVIDd:        5.20 cm   +----------------+---------++ +--------------+--------++  LV E/e' lateral:8.3       LVIDs:        3.70 cm   +----------------+---------++ +--------------+--------++  LV e' medial:   6.28 cm/s LV PW:        0.90 cm   +----------------+---------++ +--------------+--------++  LV E/e' medial: 9.7       LV IVS:       1.00 cm   +----------------+---------++ +--------------+--------++ LVOT diam:    2.60 cm   +----------------------+-------++ +--------------+--------++  2D Longitudinal Strain        LV SV:        71 ml     +----------------------+-------++ +--------------+--------++  2D Strain GLS (A2C):  -13.7 % LV SV Index:  33.87     +----------------------+-------++ +--------------+--------++  2D Strain GLS (A3C):  -14.4 % LVOT Area:    5.31 cm  +----------------------+-------++ +--------------+--------++  2D Strain GLS (A4C):  -10.6 %                          +----------------------+-------++ +--------------+--------++  2D Strain GLS Avg:    -12.9 % +----------------------+-------++  +---------------+---------+---------+ RIGHT VENTRICLE                   +---------------+---------+---------+ RV S prime:    6.79 cm/s14.4 cm/s +---------------+---------+---------+ TAPSE (M-mode):1.6 cm   2.37 cm   +---------------+---------+---------+  +------------------+-----------++ AORTIC VALVE                  +------------------+-----------++  AV Area (Vmax):   2.95 cm    +------------------+-----------++ AV Area (Vmean):  2.89 cm    +------------------+-----------++ AV Area (VTI):    3.47 cm    +------------------+-----------++ AV Vmax:          106.00 cm/s +------------------+-----------++ AV Vmean:         71.000 cm/s +------------------+-----------++ AV VTI:           0.257 m     +------------------+-----------++ AV Peak Grad:     4.5 mmHg    +------------------+-----------++ AV Mean Grad:     2.0 mmHg    +------------------+-----------++ LVOT Vmax:        58.80 cm/s  +------------------+-----------++ LVOT Vmean:       38.700 cm/s +------------------+-----------++ LVOT VTI:         0.168 m     +------------------+-----------++ LVOT/AV VTI ratio:0.65        +------------------+-----------++  +--------------+-------++ AORTA                 +--------------+-------++ Ao Sinus diam:3.80 cm +--------------+-------++ Ao STJ diam:  2.4 cm  +--------------+-------++ Ao Asc diam:  2.90 cm +--------------+-------++  +--------------+----------++ MITRAL VALVE              +--------------+-------+ +--------------+----------++  SHUNTS                MV Area (PHT):4.10 cm    +--------------+-------+ +--------------+----------++  Systemic VTI: 0.17 m  MV Peak grad: 1.4 mmHg    +--------------+-------+ +--------------+----------++   Systemic Diam:2.60 cm MV Mean grad: 0.0 mmHg    +--------------+-------+ +--------------+----------++ MV Vmax:      0.60 m/s   +--------------+----------++ MV Vmean:     19.9 cm/s  +--------------+----------++ MV VTI:       0.19 m     +--------------+----------++ MV PHT:       53.65 msec +--------------+----------++ MV Decel Time:185 msec   +--------------+----------++ +--------------+----------++ MV E velocity:61.00 cm/s +--------------+----------++ MV A velocity:42.00 cm/s +--------------+----------++ MV E/A ratio: 1.45       +--------------+----------++   Cordella Fix Electronically signed by Cordella Fix Signature Date/Time: 06/24/2023/4:38:21 PM    Final    CT SCANS  CT CARDIAC SCORING (SELF PAY ONLY) 07/09/2022  Addendum 07/09/2022  3:22 PM ADDENDUM REPORT: 07/09/2022 15:19  CLINICAL DATA:  Cardiovascular Disease Risk stratification  EXAM: Coronary Calcium  Score  TECHNIQUE: A gated, non-contrast computed tomography scan of the heart was performed using 3mm slice thickness. Axial images were analyzed on a dedicated workstation. Calcium  scoring of the coronary arteries was performed using the Agatston method.  FINDINGS: Coronary arteries: Normal origins.  Coronary Calcium  Score:  Left main: 0  Left anterior descending artery: 1336  Left circumflex artery: 0  Right coronary artery: 420  Total: 1756  Percentile: 95  Pericardium: Normal.  Ascending Aorta: Normal caliber.  38 mm.  Aortic atherosclerosis.  Non-cardiac: See separate report from [facility].  IMPRESSION: 1. Coronary calcium  score of 1756. This was 95 percentile for age-, race-, and sex-matched controls.  2.  Aortic atherosclerosis.  RECOMMENDATIONS: Coronary artery calcium  (CAC) score is a strong predictor of incident coronary heart disease (CHD) and provides predictive information beyond traditional risk factors.  CAC scoring is reasonable to use in the decision to withhold, postpone, or initiate statin therapy in intermediate-risk or selected borderline-risk asymptomatic adults (age 34-75 years and LDL-C >=70 to <190 mg/dL) who do not have diabetes or established atherosclerotic cardiovascular disease (ASCVD).* In intermediate-risk (10-year ASCVD risk >=7.5% to <20%) adults or  selected borderline-risk (10-year ASCVD risk >=5% to <7.5%) adults in whom a CAC score is measured for the purpose of making a treatment decision the following recommendations have been made:  If CAC=0, it is reasonable to withhold statin therapy and reassess in 5 to 10 years, as long as higher risk conditions are absent (diabetes mellitus, family history of premature CHD in first degree relatives (males <55 years; females <65 years), cigarette smoking, or LDL >=190 mg/dL).  If CAC is 1 to 99, it is reasonable to initiate statin therapy for patients >=3 years of age.  If CAC is >=100 or >=75th percentile, it is reasonable to initiate statin therapy at any age.  Cardiology referral should be considered for patients with CAC scores >=400 or >=75th percentile.  *2018 AHA/ACC/AACVPR/AAPA/ABC/ACPM/ADA/AGS/APhA/ASPC/NLA/PCNA Guideline on the Management of Blood Cholesterol: A Report of the American College of Cardiology/American Heart Association Task Force on Clinical Practice Guidelines. J Am Coll Cardiol. 2019;73(24):3168-3209.   Electronically Signed By: Oneil Parchment M.D. On: 07/09/2022 15:19  Narrative EXAM: OVER-READ INTERPRETATION  CT CHEST  The following report is an over-read performed by radiologist Dr. Newell Gemma [facility] on 07/09/2022. This over-read does not include interpretation of cardiac or coronary anatomy or pathology. The interpretation by the cardiologist is attached.  COMPARISON:  None.  FINDINGS: Scout view is unremarkable. Calcified mediastinal and hilar  lymph nodes. Atherosclerotic calcification of the aorta. Small juxtadiaphragmatic and prepericardiac lymph nodes. Peribronchovascular and subpleural nodularity in the lungs. Degenerative changes in the spine.  IMPRESSION: 1. 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 2.  Aortic atherosclerosis (ICD10-I70.0).  Electronically Signed: By: Newell Eke M.D. On: 07/09/2022 12:45     ______________________________________________________________________________________________      Risk Assessment/Calculations          Physical Exam VS:  BP 134/74   Pulse 64   Ht 5' 9 (1.753 m)   Wt 208 lb (94.3 kg)   SpO2 96%   BMI 30.72 kg/m        Wt Readings from Last 3 Encounters:  04/21/24 208 lb (94.3 kg)  12/30/23 206 lb 2.1 oz (93.5 kg)  12/07/23 203 lb (92.1 kg)    GEN: Well nourished, well developed in no acute distress NECK: No JVD; No carotid bruits CARDIAC: RRR, no murmurs, rubs, gallops RESPIRATORY:  Clear to auscultation without rales, wheezing or rhonchi  ABDOMEN: Soft, non-tender, non-distended EXTREMITIES:  No edema; No deformity   ASSESSMENT AND PLAN  CAD s/p CABG: Denies any recent chest pain.  Continue aspirin  and Plavix   Hypertension: Blood pressure well-controlled  Hyperlipidemia: LDL goal less than 55.  Continue atorvastatin  and Zetia .  Obtain fasting lipid panel, if LDL remain elevated, will consider lipid clinic referral to consider PCSK9 inhibitor  PAD: History of a right BKA.  Recent left ABI was 0.8.  Patient denies any significant leg discomfort.       Dispo: Follow-up with Dr. Court in 6 months.  Signed, Scot Ford, PA

## 2024-04-23 LAB — LAB REPORT - SCANNED
A1c: 5.9
EGFR: 86

## 2024-04-25 ENCOUNTER — Ambulatory Visit: Payer: Self-pay | Admitting: Cardiovascular Disease

## 2024-04-28 ENCOUNTER — Ambulatory Visit: Admitting: Pulmonary Disease

## 2024-04-29 ENCOUNTER — Encounter: Admitting: Physical Medicine and Rehabilitation

## 2024-05-09 ENCOUNTER — Other Ambulatory Visit: Payer: Self-pay

## 2024-05-09 ENCOUNTER — Encounter: Payer: Self-pay | Admitting: Surgery

## 2024-05-09 ENCOUNTER — Ambulatory Visit: Attending: Surgery | Admitting: Surgery

## 2024-05-09 VITALS — BP 163/78 | HR 65 | Temp 98.0°F | Ht 69.0 in | Wt 211.0 lb

## 2024-05-09 DIAGNOSIS — I7025 Atherosclerosis of native arteries of other extremities with ulceration: Secondary | ICD-10-CM

## 2024-05-09 NOTE — Progress Notes (Signed)
 [facility] [place]  Patient name: Clinton [name] MRN: [id number] DOB: 1956-01-25 Sex: male   REASON FOR VISIT:    Follow-up  HISOTRY OF PRESENT ILLNESS:   Clinton [name] is a 68 y.o. male who has undergone the following procedures:     09/05/2015: Right carotid endarterectomy (asymptomatic, Dr. Harvey) 02/04/2022: DCB, right popliteal artery, PTA right anterior tibial artery (ulcers, Dr. Serene) 02/18/2022: DCB, left popliteal artery, PTA, left peroneal artery, atherectomy, left popliteal and peroneal artery (ulcers, Oneda Duffett) 04/15/2022: Angioplasty, right popliteal artery, angioplasty, right anterior tibial artery (ulcer, Treyvone Chelf) 04/16/2022: Right fifth toe amputation Claud) 05/09/2022: Right below-knee amputation Claud) 06/20/2022: Revision right below-knee amputation Claud) 10/28/2022: Left below-knee popliteal artery shockwave lithotripsy and drug-coated balloon angioplasty.  Posterior tibial angioplasty (ulcer)     He is now walking with a prosthesis.  There are no wounds on his left foot.  He is on dual antiplatelet therapy and a statin.    He is back today for surveillance imaging.  He sees Dr. Dulcie he periodically for his toenails.  He has undergone CABG at the end of 2024.  He denies claudication symptoms.  He does not have any open wounds.   PAST MEDICAL HISTORY:   Past Medical History:  Diagnosis Date   Carotid artery occlusion    Cellulitis    Erectile dysfunction    Hyperlipidemia    Hypertension    Peripheral vascular disease      FAMILY HISTORY:   Family History  Problem Relation Age of Onset   Other Mother        circulatory problems   Heart attack Mother    Deep vein thrombosis Mother    Heart disease Mother    Hyperlipidemia Father    Hypertension Father    Diabetes Father    Deep vein thrombosis Father    Heart disease Father        before age 67   Heart attack Father     Peripheral vascular disease Father    Hypertension Brother    Hyperlipidemia Brother     SOCIAL HISTORY:   Social History   Tobacco Use   Smoking status: Never   Smokeless tobacco: Former    Types: Chew    Quit date: 01/01/2015  Substance Use Topics   Alcohol use: Not Currently     ALLERGIES:   Allergies  Allergen Reactions   Penicillins Rash    Has patient had a PCN reaction causing immediate rash, facial/tongue/throat swelling, SOB or lightheadedness with hypotension: Yes Has patient had a PCN reaction causing severe rash involving mucus membranes or skin necrosis: No Has patient had a PCN reaction that required hospitalization No Has patient had a PCN reaction occurring within the last 10 years: No If all of the above answers are NO, then may proceed with Cephalosporin use.  Tolerated Cefazolin  on 04/16/22    Sulfa  Antibiotics Swelling and Other (See Comments)    Facial/lip swelling     CURRENT MEDICATIONS:   Current Outpatient Medications  Medication Sig Dispense Refill   amLODipine  (NORVASC ) 5 MG tablet Take 1 tablet (5 mg total) by mouth daily. 90 tablet 3   aspirin  EC 81 MG tablet Take 81 mg by mouth at bedtime.     atorvastatin  (LIPITOR ) 80 MG tablet Take 80 mg by mouth at bedtime.     clopidogrel  (PLAVIX ) 75 MG tablet TAKE 1 TABLET BY MOUTH EVERY DAY 90 tablet 3   ezetimibe  (ZETIA ) 10  MG tablet Take 1 tablet (10 mg total) by mouth daily. 90 tablet 3   Fe Fum-Vit C-Vit B12-FA (TRIGELS-F FORTE) CAPS capsule Take 1 capsule by mouth daily. 30 capsule 0   metoprolol  tartrate (LOPRESSOR ) 25 MG tablet Take 0.5 tablets (12.5 mg total) by mouth 2 (two) times daily. 90 tablet 3   Multiple Vitamin (MULTIVITAMIN) capsule Take 2 capsules by mouth every morning. Gummy     Omega-3 Fatty Acids (OMEGA 3 PO) Take 2 capsules by mouth daily. 454 mg each     predniSONE  (DELTASONE ) 10 MG tablet Take 1 tablet (10 mg total) by mouth daily with breakfast. 30 tablet 0   Probiotic  Product (PROBIOTIC PO) Take 220 mg by mouth daily.     vitamin C  (ASCORBIC ACID ) 250 MG tablet Take 500 mg by mouth daily.     zinc  sulfate 220 (50 Zn) MG capsule Take 1 capsule (220 mg total) by mouth daily.     No current facility-administered medications for this visit.    REVIEW OF SYSTEMS:   [X]  denotes positive finding, [ ]  denotes negative finding Cardiac  Comments:  Chest pain or chest pressure:    Shortness of breath upon exertion:    Short of breath when lying flat:    Irregular heart rhythm:        Vascular    Pain in calf, thigh, or hip brought on by ambulation:    Pain in feet at night that wakes you up from your sleep:     Blood clot in your veins:    Leg swelling:         Pulmonary    Oxygen at home:    Productive cough:     Wheezing:         Neurologic    Sudden weakness in arms or legs:     Sudden numbness in arms or legs:     Sudden onset of difficulty speaking or slurred speech:    Temporary loss of vision in one eye:     Problems with dizziness:         Gastrointestinal    Blood in stool:     Vomited blood:         Genitourinary    Burning when urinating:     Blood in urine:        Psychiatric    Major depression:         Hematologic    Bleeding problems:    Problems with blood clotting too easily:        Skin    Rashes or ulcers:        Constitutional    Fever or chills:      PHYSICAL EXAM:   Vitals:   05/09/24 1000  BP: (!) 163/78  Pulse: 65  Temp: 98 F (36.7 C)  SpO2: 95%  Weight: 211 lb (95.7 kg)  Height: 5' 9 (1.753 m)    GENERAL: The patient is a well-nourished male, in no acute distress. The vital signs are documented above. CARDIAC: There is a regular rate and rhythm.  VASCULAR: No open wounds on the left foot.  Pulses are not palpable. PULMONARY: Non-labored respirations MUSCULOSKELETAL: There are no major deformities or cyanosis. NEUROLOGIC: No focal weakness or paresthesias are detected. SKIN: There are no  ulcers or rashes noted. PSYCHIATRIC: The patient has a normal affect.  STUDIES:   Reviewed the following: +-------+-----------+-----------+------------+------------+  ABI/TBIToday's ABIToday's TBIPrevious ABIPrevious TBI  +-------+-----------+-----------+------------+------------+  Right BKA  BKA        BKA         BKA           +-------+-----------+-----------+------------+------------+  Left  0.83       0.17       0.65        0.48          +-------+-----------+-----------+------------+------------+  Left toe pressure: 31, waveforms are monophasic  Lower extremity: Left: 30-49% stenosis noted in the superficial femoral artery. 30-49%  stenosis noted in the peroneal artery.   MEDICAL ISSUES:   PAD: He is without open wounds or symptoms.  Today's ultrasound shows a slightly improved ABI.  He has known tibial disease.  We will continue with surveillance.  He will return in 6 months.  He knows to contact me sooner should he develop a nonhealing wound    Malvina New, IV, MD, FACS [facility] [place] [phone] Pager [phone]

## 2024-05-10 ENCOUNTER — Other Ambulatory Visit: Payer: Self-pay | Admitting: Cardiovascular Disease

## 2024-05-10 MED ORDER — AMLODIPINE BESYLATE 5 MG PO TABS: 5.0000 mg | ORAL_TABLET | Freq: Every day | ORAL | 3 refills | Status: AC

## 2024-05-12 ENCOUNTER — Other Ambulatory Visit: Payer: Self-pay | Admitting: *Deleted

## 2024-05-12 DIAGNOSIS — I739 Peripheral vascular disease, unspecified: Secondary | ICD-10-CM

## 2024-05-12 DIAGNOSIS — I7025 Atherosclerosis of native arteries of other extremities with ulceration: Secondary | ICD-10-CM

## 2024-06-13 ENCOUNTER — Encounter: Payer: Self-pay | Admitting: Radiology

## 2024-06-20 ENCOUNTER — Ambulatory Visit: Admitting: Physical Medicine and Rehabilitation

## 2024-06-21 ENCOUNTER — Other Ambulatory Visit

## 2024-06-28 ENCOUNTER — Ambulatory Visit
Admission: RE | Admit: 2024-06-28 | Discharge: 2024-06-28 | Disposition: A | Source: Ambulatory Visit | Attending: Pulmonary Disease | Admitting: Pulmonary Disease

## 2024-06-28 DIAGNOSIS — R918 Other nonspecific abnormal finding of lung field: Secondary | ICD-10-CM

## 2024-06-28 DIAGNOSIS — R590 Localized enlarged lymph nodes: Secondary | ICD-10-CM

## 2024-07-04 ENCOUNTER — Encounter: Payer: Self-pay | Admitting: Orthopedic Surgery

## 2024-07-18 ENCOUNTER — Encounter: Admitting: Physical Medicine and Rehabilitation

## 2024-07-31 ENCOUNTER — Other Ambulatory Visit: Payer: Self-pay | Admitting: Cardiovascular Disease

## 2024-08-04 ENCOUNTER — Other Ambulatory Visit: Payer: Self-pay | Admitting: Cardiovascular Disease

## 2024-08-10 ENCOUNTER — Other Ambulatory Visit: Payer: Self-pay | Admitting: Cardiovascular Disease

## 2024-08-15 ENCOUNTER — Encounter: Payer: Self-pay | Admitting: Surgery

## 2024-09-06 ENCOUNTER — Ambulatory Visit: Admitting: Pulmonary Disease

## 2024-09-12 ENCOUNTER — Encounter: Admitting: Physical Medicine and Rehabilitation

## 2024-10-12 ENCOUNTER — Ambulatory Visit: Admitting: Pulmonary Disease

## 2024-10-19 ENCOUNTER — Ambulatory Visit: Admitting: Cardiovascular Disease

## 2024-11-14 ENCOUNTER — Encounter (HOSPITAL_COMMUNITY)

## 2024-11-14 ENCOUNTER — Ambulatory Visit: Admitting: Surgery

## 2024-12-19 ENCOUNTER — Encounter: Admitting: Physical Medicine and Rehabilitation
